# Patient Record
Sex: Male | Born: 1935
Health system: Southern US, Community
[De-identification: ages and names within clinical notes are randomized; demographics above are authoritative.]

## PROBLEM LIST (undated history)

## (undated) DIAGNOSIS — Z0389 Encounter for observation for other suspected diseases and conditions ruled out: Secondary | ICD-10-CM

## (undated) DIAGNOSIS — Z87442 Personal history of urinary calculi: Secondary | ICD-10-CM

## (undated) DIAGNOSIS — Z972 Presence of dental prosthetic device (complete) (partial): Secondary | ICD-10-CM

## (undated) DIAGNOSIS — K4090 Unilateral inguinal hernia, without obstruction or gangrene, not specified as recurrent: Secondary | ICD-10-CM

## (undated) DIAGNOSIS — IMO0001 Reserved for inherently not codable concepts without codable children: Secondary | ICD-10-CM

## (undated) DIAGNOSIS — F329 Major depressive disorder, single episode, unspecified: Secondary | ICD-10-CM

## (undated) DIAGNOSIS — K5730 Diverticulosis of large intestine without perforation or abscess without bleeding: Secondary | ICD-10-CM

## (undated) DIAGNOSIS — K648 Other hemorrhoids: Secondary | ICD-10-CM

## (undated) DIAGNOSIS — M069 Rheumatoid arthritis, unspecified: Secondary | ICD-10-CM

## (undated) DIAGNOSIS — E041 Nontoxic single thyroid nodule: Secondary | ICD-10-CM

## (undated) DIAGNOSIS — Z952 Presence of prosthetic heart valve: Secondary | ICD-10-CM

## (undated) DIAGNOSIS — I10 Essential (primary) hypertension: Secondary | ICD-10-CM

## (undated) DIAGNOSIS — I350 Nonrheumatic aortic (valve) stenosis: Secondary | ICD-10-CM

## (undated) DIAGNOSIS — N400 Enlarged prostate without lower urinary tract symptoms: Secondary | ICD-10-CM

## (undated) DIAGNOSIS — K219 Gastro-esophageal reflux disease without esophagitis: Secondary | ICD-10-CM

## (undated) DIAGNOSIS — E785 Hyperlipidemia, unspecified: Secondary | ICD-10-CM

## (undated) DIAGNOSIS — F32A Depression, unspecified: Secondary | ICD-10-CM

## (undated) DIAGNOSIS — H9190 Unspecified hearing loss, unspecified ear: Secondary | ICD-10-CM

## (undated) HISTORY — PX: COLONOSCOPY: SHX174

## (undated) HISTORY — PX: CARDIAC CATHETERIZATION: SHX172

## (undated) HISTORY — DX: Rheumatoid arthritis, unspecified: M06.9

## (undated) HISTORY — DX: Encounter for observation for other suspected diseases and conditions ruled out: Z03.89

## (undated) HISTORY — PX: CATARACT EXTRACTION, BILATERAL: SHX1313

## (undated) HISTORY — DX: Reserved for inherently not codable concepts without codable children: IMO0001

## (undated) HISTORY — DX: Nontoxic single thyroid nodule: E04.1

## (undated) HISTORY — DX: Gilbert syndrome: E80.4

## (undated) HISTORY — PX: CARDIOVASCULAR STRESS TEST: SHX262

## (undated) SURGERY — RIGHT/LEFT HEART CATH AND CORONARY ANGIOGRAPHY
Anesthesia: LOCAL

---

## 2001-01-28 ENCOUNTER — Observation Stay (HOSPITAL_COMMUNITY): Admission: EM | Admit: 2001-01-28 | Discharge: 2001-01-29 | Payer: Self-pay

## 2001-02-28 ENCOUNTER — Encounter: Payer: Self-pay | Admitting: Gastroenterology

## 2001-02-28 ENCOUNTER — Encounter: Admission: RE | Admit: 2001-02-28 | Discharge: 2001-02-28 | Payer: Self-pay | Admitting: Gastroenterology

## 2003-01-21 ENCOUNTER — Ambulatory Visit (HOSPITAL_COMMUNITY): Admission: RE | Admit: 2003-01-21 | Discharge: 2003-01-21 | Payer: Self-pay | Admitting: Gastroenterology

## 2003-07-01 ENCOUNTER — Ambulatory Visit (HOSPITAL_COMMUNITY): Admission: RE | Admit: 2003-07-01 | Discharge: 2003-07-01 | Payer: Self-pay | Admitting: Neurology

## 2005-07-27 ENCOUNTER — Encounter: Admission: RE | Admit: 2005-07-27 | Discharge: 2005-07-27 | Payer: Self-pay | Admitting: Gastroenterology

## 2009-05-11 ENCOUNTER — Encounter: Admission: RE | Admit: 2009-05-11 | Discharge: 2009-05-11 | Payer: Self-pay | Admitting: Internal Medicine

## 2009-12-04 ENCOUNTER — Encounter: Admission: RE | Admit: 2009-12-04 | Discharge: 2009-12-04 | Payer: Self-pay | Admitting: Geriatric Medicine

## 2010-07-17 ENCOUNTER — Emergency Department (HOSPITAL_COMMUNITY): Payer: Medicare Other

## 2010-07-17 ENCOUNTER — Inpatient Hospital Stay (HOSPITAL_COMMUNITY)
Admission: EM | Admit: 2010-07-17 | Discharge: 2010-07-19 | DRG: 287 | Disposition: A | Discharge status: Home / Self Care | Payer: Medicare Other | Attending: Cardiology | Admitting: Cardiology

## 2010-07-17 DIAGNOSIS — F3289 Other specified depressive episodes: Secondary | ICD-10-CM | POA: Diagnosis present

## 2010-07-17 DIAGNOSIS — F329 Major depressive disorder, single episode, unspecified: Secondary | ICD-10-CM | POA: Diagnosis present

## 2010-07-17 DIAGNOSIS — R0602 Shortness of breath: Secondary | ICD-10-CM | POA: Diagnosis present

## 2010-07-17 DIAGNOSIS — R0789 Other chest pain: Principal | ICD-10-CM | POA: Diagnosis present

## 2010-07-17 DIAGNOSIS — I10 Essential (primary) hypertension: Secondary | ICD-10-CM | POA: Diagnosis present

## 2010-07-17 DIAGNOSIS — I359 Nonrheumatic aortic valve disorder, unspecified: Secondary | ICD-10-CM | POA: Diagnosis present

## 2010-07-17 LAB — BRAIN NATRIURETIC PEPTIDE: Pro B Natriuretic peptide (BNP): 105 pg/mL — ABNORMAL HIGH (ref 0.0–100.0)

## 2010-07-17 LAB — POCT CARDIAC MARKERS: Troponin i, poc: <0.05 ng/mL (ref 0.00–0.09)

## 2010-07-17 LAB — COMPREHENSIVE METABOLIC PANEL
ALT: 38 U/L (ref 0–53)
AST: 34 U/L (ref 0–37)
Albumin: 3.9 g/dL (ref 3.5–5.2)
Alkaline Phosphatase: 57 U/L (ref 39–117)
BUN: 15 mg/dL (ref 6–23)
Chloride: 104 mEq/L (ref 96–112)
GFR calc Af Amer: >60 mL/min (ref >60)
Potassium: 3.8 mEq/L (ref 3.5–5.1)
Sodium: 138 mEq/L (ref 135–145)
Total Bilirubin: 1.7 mg/dL — ABNORMAL HIGH (ref 0.3–1.2)
Total Protein: 7.6 g/dL (ref 6.0–8.3)

## 2010-07-17 LAB — POCT I-STAT, CHEM 8
Calcium, Ion: 1.14 mmol/L (ref 1.12–1.32)
Chloride: 106 mEq/L (ref 96–112)
Creatinine, Ser: 1.2 mg/dL (ref 0.4–1.5)
Glucose, Bld: 91 mg/dL (ref 70–99)
HCT: 43 % (ref 39.0–52.0)

## 2010-07-17 LAB — DIFFERENTIAL
Eosinophils Relative: 4 % (ref 0–5)
Lymphocytes Relative: 23 % (ref 12–46)
Lymphs Abs: 1.6 10*3/uL (ref 0.7–4.0)
Monocytes Absolute: 0.6 10*3/uL (ref 0.1–1.0)

## 2010-07-17 LAB — CBC
HCT: 41.1 % (ref 39.0–52.0)
Hemoglobin: 13.5 g/dL (ref 13.0–17.0)
MCHC: 32.8 g/dL (ref 30.0–36.0)
MCV: 86.9 fL (ref 78.0–100.0)
MCV: 87.1 fL (ref 78.0–100.0)
Platelets: 172 10*3/uL (ref 150–400)
RBC: 4.9 MIL/uL (ref 4.22–5.81)
RDW: 12.4 % (ref 11.5–15.5)
RDW: 12.4 % (ref 11.5–15.5)
WBC: 7.4 10*3/uL (ref 4.0–10.5)

## 2010-07-17 LAB — PROTIME-INR: INR: 1 (ref 0.00–1.49)

## 2010-07-18 DIAGNOSIS — I359 Nonrheumatic aortic valve disorder, unspecified: Secondary | ICD-10-CM

## 2010-07-18 LAB — CARDIAC PANEL(CRET KIN+CKTOT+MB+TROPI)
CK, MB: 2.7 ng/mL (ref 0.3–4.0)
Relative Index: INVALID (ref 0.0–2.5)
Total CK: 87 U/L (ref 7–232)
Troponin I: 0.01 ng/mL (ref 0.00–0.06)

## 2010-07-18 LAB — TSH: TSH: 2.04 u[IU]/mL (ref 0.350–4.500)

## 2010-07-19 ENCOUNTER — Ambulatory Visit (HOSPITAL_COMMUNITY)
Admission: RE | Admit: 2010-07-19 | Discharge: 2010-07-19 | Disposition: A | Discharge status: Home / Self Care | Payer: Medicare Other | Source: Ambulatory Visit | Attending: Cardiology | Admitting: Cardiology

## 2010-07-19 DIAGNOSIS — I10 Essential (primary) hypertension: Secondary | ICD-10-CM | POA: Insufficient documentation

## 2010-07-19 DIAGNOSIS — R0602 Shortness of breath: Secondary | ICD-10-CM | POA: Insufficient documentation

## 2010-07-19 DIAGNOSIS — I359 Nonrheumatic aortic valve disorder, unspecified: Secondary | ICD-10-CM | POA: Insufficient documentation

## 2010-07-19 DIAGNOSIS — E041 Nontoxic single thyroid nodule: Secondary | ICD-10-CM | POA: Insufficient documentation

## 2010-07-19 DIAGNOSIS — F3289 Other specified depressive episodes: Secondary | ICD-10-CM | POA: Insufficient documentation

## 2010-07-19 DIAGNOSIS — R0789 Other chest pain: Secondary | ICD-10-CM | POA: Insufficient documentation

## 2010-07-19 DIAGNOSIS — F329 Major depressive disorder, single episode, unspecified: Secondary | ICD-10-CM | POA: Insufficient documentation

## 2010-07-19 LAB — BASIC METABOLIC PANEL
CO2: 26 mEq/L (ref 19–32)
Calcium: 9.1 mg/dL (ref 8.4–10.5)
Chloride: 103 mEq/L (ref 96–112)
Creatinine, Ser: 0.92 mg/dL (ref 0.4–1.5)
GFR calc Af Amer: >60 mL/min (ref >60)
Glucose, Bld: 106 mg/dL — ABNORMAL HIGH (ref 70–99)

## 2010-07-19 LAB — CBC
HCT: 43.3 % (ref 39.0–52.0)
Hemoglobin: 14.4 g/dL (ref 13.0–17.0)
MCH: 28.8 pg (ref 26.0–34.0)
MCHC: 33.3 g/dL (ref 30.0–36.0)
RBC: 5 MIL/uL (ref 4.22–5.81)

## 2010-07-19 LAB — POCT I-STAT 3, ART BLOOD GAS (G3+)
Bicarbonate: 26.1 mEq/L — ABNORMAL HIGH (ref 20.0–24.0)
O2 Saturation: 92 %
TCO2: 28 mmol/L (ref 0–100)
pO2, Arterial: 67 mmHg — ABNORMAL LOW (ref 80.0–100.0)

## 2010-07-19 LAB — POCT I-STAT 3, VENOUS BLOOD GAS (G3P V)
Bicarbonate: 26.8 mEq/L — ABNORMAL HIGH (ref 20.0–24.0)
pCO2, Ven: 49.5 mmHg (ref 45.0–50.0)
pH, Ven: 7.328 — ABNORMAL HIGH (ref 7.250–7.300)
pH, Ven: 7.341 — ABNORMAL HIGH (ref 7.250–7.300)
pO2, Ven: 33 mmHg (ref 30.0–45.0)

## 2010-07-27 NOTE — Discharge Summary (Addendum)
NAMEBRENDAN, [name]               ACCOUNT NO.:  [id number]  MEDICAL RECORD NO.:  [id number]           PATIENT TYPE:  O  LOCATION:  [facility]                         FACILITY:  [facility]  PHYSICIAN:  Armanda Magic, M.D.     DATE OF BIRTH:  December 10, 1927  DATE OF ADMISSION:  07/19/2010 DATE OF DISCHARGE:                              DISCHARGE SUMMARY   [name] medical record number is [id number].  PRIMARY CARDIOLOGIST:  Jake Bathe, MD  ADMISSION DIAGNOSES: 1. Chest pain. 2. Shortness of breath. 3. Moderate aortic stenosis by echocardiogram in May 2011. 4. Hypertension. 5. Depression. 6. Thyroid nodules.  DISCHARGE DIAGNOSES: 1. Moderate aortic stenosis with mean aortic valve gradient 18 mmHg,     aortic valve area 1.1 sq cm. 2. Normal coronary arteries by cath. 3. Depression. 4. Hypertension. 5. Thyroid nodules. 6. Noncardiac chest pain and shortness of breath, possibly due to     anxiety.  PROCEDURES: 1. Right and left heart catheterization. 2. Coronary angiography. 3. Left ventriculography.  HISTORY OF PRESENT ILLNESS:  This is an 77 year old male who has a history of moderate aortic stenosis, diagnosed in May 2011 by echocardiogram, also normal nuclear stress test at that time who presented to [facility] Emergency Room with complaints of chest pain and shortness of breath.  This has in going on for 2 days, mainly at night, and he would have to wake up and move around to get comfortably again.  Apparently, he had not been exercising due to his illness of his wife for 4 months and started exercising again.  He did not have any exertional chest pain or shortness of breath.  He was admitted to the hospital and ruled out for myocardial infarction by serial cardiac enzymes.  He was started on subcu Lovenox.  A 2-D echocardiogram was obtained which really showed no significant change from his echocardiogram in May 2011.  Because of the patient's symptoms, it was felt that we  should proceed with cardiac catheterization.  On July 19, 2009, he underwent cardiac catheterization revealing normal coronary arteries and normal LV function.  Moderate aortic stenosis was noted with a mean aortic valve gradient of 18 mmHg with an aortic valve area calculated at 1.1 sq cm.  There was no evidence of pulmonary hypertension.  The patient did well post cath without any complications. He subsequently was discharged to home after his IV fluids and bedrest were completed.  LABORATORY DATA DURING HOSPITAL STAY:  Sodium 139, potassium 4.8, chloride 106, bicarb 29, BUN 23, creatinine 1.2, glucose 91.  INR 1. Point-of-care markers negative x2.  White cell count 6.7, hemoglobin 13.5, hematocrit 41.1, platelet count 164.  Chest x-ray shows no active disease.  DISCHARGE MEDICATIONS: 1. Avodart 0.5 mg daily. 2. Diovan 80 mg daily. 3. Fish oil over the counter one tablet daily. 4. Lexapro 10 mg daily. 5. Simvastatin 20 mg daily. 6. VESIcare 5 mg daily.  FOLLOWUP:  The patient will follow up with Dr. Donato Schultz on August 03, 2010, at 10:00 a.m.  A total of 45 minutes was used to prepare the patient's discharge including preparing discharge summary,  discharge medication sheet, followup appointments, and discharge dictation.     Armanda Magic, M.D.     TT/MEDQ  D:  07/19/2010  T:  07/20/2010  Job:  [id number]  cc:   Jake Bathe, MD  Electronically Signed by Armanda Magic M.D. on 08/21/2010 02:07:11 PM

## 2010-07-27 NOTE — H&P (Signed)
[name], [name]               ACCOUNT NO.:  [id number]  MEDICAL RECORD NO.:  [id number]           PATIENT TYPE:  I  LOCATION:  1427                         FACILITY:  [facility]  PHYSICIAN:  Armanda Magic, M.D.     DATE OF BIRTH:  08-Jan-1928  DATE OF ADMISSION:  07/17/2010 DATE OF DISCHARGE:  07/19/2010                             HISTORY & PHYSICAL   REFERRING PHYSICIAN:  [facility].  CHIEF COMPLAINT:  Chest pain and shortness of breath.  HISTORY OF PRESENT ILLNESS:  This is an 77 year old male with a history of moderate aortic stenosis by echo in May 2011, with normal nuclear stress test in May 2011, who presented to [facility] Emergency Room with complaints of chest pain and shortness of breath.  The patient states he had had chest pain, described as a heaviness for 2 days, associated with shortness of breath.  He denies any true chest pain, but mainly heaviness.  Apparently, he normally exercises but has not been doing this due to caring for his sick wife.  He states he started exercising over the past 2 days.  Currently, he is asymptomatic with no PND, but does describe symptoms of orthopnea.  There is a significant language barrier and so it is difficult to get an accurate history but it does sound like he was getting short of breath lying in bed and would have to move around to be comfortable again.  PAST MEDICAL HISTORY:  Includes depression, moderate aortic stenosis by echo in May 2011, his mean gradient at that time was 22 mmHg with an aortic valve area of 1.04 cm2.  Hypertension, thyroid nodules.  Nuclear stress test in May 2011, showing no ischemia, normal LV function.  FAMILY HISTORY:  His father died at 87 secondary to CVA, his mother died at 25 secondary to pulmonary embolus after surgery.  SOCIAL HISTORY:  There is a history of tobacco use, but he quit in 1982. He occasionally drinks alcohol.  He denies any IV drug use.  MEDICATIONS: 1.  Lexapro 10 mg daily. 2. Diovan 80 mg daily. 3. Simvastatin 20 mg daily. 4. Avodart 0.5 mg daily. 5. Zofran orally disintegrating tablet 4 mg p.r.n. 6. VESIcare 5 mg daily.  ALLERGIES:  PENICILLIN.  REVIEW OF SYSTEMS:  Otherwise as stated in HPI is negative.  PHYSICAL EXAMINATION:  VITAL SIGNS:  Blood pressure is 117/59, heart rate is 49, O2 saturation is 99% on 2 liters. GENERAL:  Well-developed, well-nourished white male, in no acute distress. HEENT:  Benign. NECK:  Supple without lymphadenopathy.  Carotid upstrokes +2 bilateral. No bruits. LUNGS:  Clear to auscultation throughout. HEART:  Regular rate and rhythm with a 2/6 systolic murmur at the right upper sternal border, late peaking. ABDOMEN:  Soft, nontender, nondistended. EXTREMITIES:  No edema.  LABS:  Sodium 139, potassium 4.8, chloride 106, bicarb 29, BUN 23, creatinine 1.2, glucose 91.  INR 1.  MBs are 1.9 and 1.9, troponin less than 0.05 x2.  EKG shows sinus bradycardia at 51 beats per minute with left atrial enlargement.  Chest x-ray shows no active disease.  ASSESSMENT: 1. Shortness of  breath and chest heaviness, worse at night.  His     daughter states he wakes up at night short of breath and moves     around and gets comfortable again before falling asleep.  There is     no obvious paroxysmal nocturnal dyspnea.  Symptoms only occur at     night but not with exercise. 2. Moderate aortic stenosis. 3. Depression. 4. Hypertension.  PLAN:  Admit to Telemetry Unit and cycle cardiac enzymes.  We will check a BNP, check 2D echocardiogram to reassess heart valve, may need right and left heart cath on Monday pending results of echo.  He has no symptoms with exercise but does seem to get uncomfortable at night with shortness of breath and chest pain when lying flat.  We will continue Lovenox per pharmacy.  No nitroglycerin at this time given his AF and he is currently pain free.     Armanda Magic,  M.D.     TT/MEDQ  D:  07/19/2010  T:  07/20/2010  Job:  [id number]  cc:   Jake Bathe, MD  Electronically Signed by Armanda Magic M.D. on 07/20/2010 09:36:48 AM

## 2010-07-27 NOTE — Procedures (Signed)
NAMEMARCIO, HOQUE               ACCOUNT NO.:  [id number]  MEDICAL RECORD NO.:  [id number]           PATIENT TYPE:  I  LOCATION:  1427                         FACILITY:  [facility]  PHYSICIAN:  Armanda Magic, M.D.     DATE OF BIRTH:  1928-03-13  DATE OF PROCEDURE:  07/19/2010 DATE OF DISCHARGE:  07/19/2010                           CARDIAC CATHETERIZATION   PROCEDURE:  Right and left heart catheterization, coronary angiography, and left ventriculography.  OPERATIVE SURGEON:  Armanda Magic, M.D.  INDICATIONS:  Chest pain, shortness of breath, and aortic stenosis.  COMPLICATIONS:  None.  IV access via right femoral artery 6-French sheath and right femoral vein 7-French sheath.  MEDICATIONS:  Versed 1 mg and fentanyl 25 mcg.  HISTORY:  This is a very pleasant 77 year old [place] male who was admitted with complaints of chest pain and shortness of breath.  He has a known history of moderate aortic stenosis by 2-D echocardiogram done in May 2011 and now presents for cardiac catheterization to evaluate possible further progression of aortic stenosis.  PROCEDURE IN DETAIL:  The patient is brought to the cardiac catheterization laboratory in the fasting nonsedated state.  Informed consent was obtained.  The patient was connected to continuous heart rate with pulse oximetry monitoring and intermittent blood pressure monitoring.  The right groin was prepped and draped in a sterile fashion.  Xylocaine 1% was used for local anesthesia.  Using the modified Seldinger technique, a 6-French sheath was placed in the right femoral artery.  Using modified Seldinger technique, a 7-French sheath was placed in the right femoral vein.  Under fluoroscopic guidance, a Swan-Ganz catheter was placed under balloon flotation in the right atrium.  Right atrial pressure was measured and right atrial O2 saturations were obtained.  The catheter was advanced into the right ventricle and right ventricular  pressure was measured.  The catheter was then advanced into the pulmonary artery, and pulmonary artery pressure and O2 saturations were obtained.  The catheter was then allowed to float into the pulmonary capillary wedge position.  Pulmonary capillary wedge pressure was measured.  The balloon was then deflated, and the catheter was pulled back into the main pulmonary artery.  Cardiac outputs were obtained on three separate injections, each using a total of 10 mL of saline.  At the completion of the cardiac output, the catheter was removed under fluoroscopy.  A 5-French JL-4 catheter was then placed into the left coronary artery. Multiple cine films were taken at 30-degree RAO and 40-degree LAO views. This catheter was exchanged over a guidewire for a 4-French JR-4 catheter which could not successfully engage the coronary ostium.  This catheter was exchanged out over a guidewire for a 5-French __________ right coronary artery catheter which successfully engaged the coronary ostium.  Multiple cine films were taken at 30-degree RAO and 40-degree LAO views.  This catheter was then exchanged over a guidewire for a 5- French angled pigtail catheter which was placed under fluoroscopic guidance in the area of the aortic valve.  It was able to cross the aortic valve into the left ventricular cavity.  Left ventriculography was performed in the  30-degree RAO view and a total of 25 mL of contrast at 12 mL/sec.  The catheter was then pulled back across the aortic valve, with only a mild gradient noted.  At the end of procedure, all catheters and sheaths were removed.  Manual compression was performed until adequate hemostasis was obtained.  The patient was transferred back to room in stable condition.  RESULTS:  Right heart cath data.  Right atrial pressure 10/8 with a mean of 6 mmHg.  RV pressure 27/3 with a mean of 7 mmHg.  PA pressure 26/13 mmHg.  Pulmonary capillary wedge 12/8 with a mean of 9  mmHg, LV pressure 118/6 with a mean of 9 mm of pressure, aortic 108/58 mmHg.  O2 saturations, RA 58%,  PA 60%, AO 92%.  Cardiac output by Fick 3.74. Cardiac index by Fick 2.  Cardiac output by thermodilution 2.78. Cardiac index by thermodilution 1.49.  Aortic pullback 112/58 mmHg, LV pullback 133/7 mmHg, mean aortic valve gradient 18 mmHg, and aortic valve area 1.12 sq cm.  The LAD is widely patent and bifurcates to left anterior descending artery and left circumflex artery.  The LAD traverses to the apex and is widely patent throughout its course.  It gives rise to two moderate sized diagonal branches, both of which are widely patent.  The left circumflex is widely patent throughout its course in the AV groove.  It gives rise to three obtuse marginal branches.  The first marginal branch is very large and widely patent.  The second obtuse marginal is moderate in size and widely patent, and the third obtuse marginal branch is small but widely patent.  The right coronary artery is nondominant but is widely patent.  The posterior descending artery does come off the left circumflex distally and is widely patent.  Left ventriculography shows normal LV function, EF 60%.  ASSESSMENT: 1. Moderate aortic stenosis. 2. Normal coronary arteries. 3. Normal left ventricular function. 4. Normal right heart pressures.  PLAN:  Discharge home after IV fluid and bedrest complete.  Will follow up with Dr. Anne Fu in 2 weeks for further evaluation.     Armanda Magic, M.D.     TT/MEDQ  D:  07/19/2010  T:  07/20/2010  Job:  [id number]  Electronically Signed by Armanda Magic M.D. on 07/20/2010 09:36:50 AM

## 2010-10-21 ENCOUNTER — Ambulatory Visit: Payer: Medicare Other | Attending: Geriatric Medicine | Admitting: Physical Therapy

## 2010-10-21 DIAGNOSIS — IMO0001 Reserved for inherently not codable concepts without codable children: Secondary | ICD-10-CM | POA: Insufficient documentation

## 2010-10-21 DIAGNOSIS — M545 Low back pain, unspecified: Secondary | ICD-10-CM | POA: Insufficient documentation

## 2010-10-21 DIAGNOSIS — M2560 Stiffness of unspecified joint, not elsewhere classified: Secondary | ICD-10-CM | POA: Insufficient documentation

## 2010-10-21 DIAGNOSIS — M25559 Pain in unspecified hip: Secondary | ICD-10-CM | POA: Insufficient documentation

## 2010-10-29 NOTE — H&P (Signed)
. [facility]  Patient:    [name], [name]                      MRN: [id number] Adm. Date:  [id number] Attending:  Daine Gip                         History and Physical  IDENTIFYING DATA:  [name] is a very nice 77 year old [place] immigrant from [place].  He has lived in the [place] for 11 years.  He has enjoyed excellent health.  HISTORY OF PRESENT ILLNESS:  At 11 p.m. last night he developed shaking chills.  Temperature rapidly went up to 101 degrees.  He then went into the rest room, apparently urinated, and granddaughter heard him wretching.  She went in and he had fainted.  He did complain of some chest tightness through this, but it appeared to be more from the wretching and was more in the upper throat area.  He had eaten some baked fish earlier in the afternoon.  His wife apparently also developed some diarrhea.  He denies any dysuria or cough, no abdominal pain.  He states he has had no pain anywhere.  He does have a prior history of nephrolithiasis.  Also of note, he has occasionally noted a white coating on his tongue, but that has been going on for some months.  He has been somewhat worried about it.  He denies any headache.  ALLERGIES:  No known drug allergies.  CURRENT MEDICATION:  An aspirin a day.  PAST MEDICAL HISTORY:  Negative for diabetes, coronary artery disease, stroke, cancer, or tuberculosis.  It is remarkable for nephrolithiasis.  PREVIOUS SURGERIES:  None.  FAMILY HISTORY:  Brother died of complications of diabetes.  One brother still has diabetes.  Sister has had breast cancer.  SOCIAL HISTORY:  He is a retired Art gallery manager.  He is married, does not smoke, no alcohol.  He has two daughters.  REVIEW OF SYSTEMS:  Currently he denies any headache, no change in vision or hearing, no shortness of breath, no chest pain, no abdominal pain.  PHYSICAL EXAMINATION:  VITAL SIGNS:  Temperature 101.6,  blood pressure 120/68, pulse 90, respiratory rate 24, O2 saturation 91%.  SKIN:  Warm and dry.  HEENT:  Pupils equal, round, and reactive to light.  Fundi on the left poorly visualized.  The right was normal.  TM normal.  Oropharynx moist.  He did have a slight white coating with a geographic appearance on his tongue.  LUNGS:  Clear.  HEART:  Regular rate and rhythm without murmurs, rubs, or gallops.  ABDOMEN:  Soft.  No hepatosplenomegaly or masses palpated.  RECTAL:  Normal tone.  Prostate 2+, nontender.  NEUROLOGIC:  Somewhat limited because of his language barrier, but daughters present demonstrated that he was oriented and appropriate.  He had a nonfocal exam.  LABORATORY DATA ON ADMISSION:  Chest x-ray, no active disease.  White count 10,800, hemoglobin 14.6, platelet count 164,000, 90% neutrophils, 5% lymphs, 5% monos.  Sodium 139, potassium 4.5, chloride 101, bicarb 30, glucose 148, BUN 12, creatinine 1.2, calcium 9.0, total protein 6.7, albumin 3.6, SGOT 30, SGPT 35, alkaline phosphatase 64, total bilirubin elevated at 1.7.  Urinalysis is normal.  CK 86 with an MB of 1.2.  Troponin 0.01.  EKG was normal.  ASSESSMENT: 1. Sudden onset of chills, high fever, question bacteremia, most logical    cause bacterial  food poisoning.  Also question cholecystitis with elevated    bilirubin, but his alkaline phosphatase and transaminase were normal, and    his exam also does not support this problem. 2. Vasovagal episode. 3. Geographic tongue.  Will get scraping for fungal elements.  PLAN:  Admit for observation.  IV fluids.  Liquid lactose free diet.  Advance as tolerated.  Culture blood and urine.  Repeat his liver panel later in the day. DD:  01/28/01 TD:  01/28/01 Job: [id number]

## 2010-10-29 NOTE — Discharge Summary (Signed)
[facility]  Patient:    DORA, CLAUSS Visit Number: [id number] MRN: [id number]          Service Type: MED Location: [phone] Attending Physician:  Daine Gip Adm. Date:  [id number] Disc. Date: [id number]                             Discharge Summary  DISCHARGE DIAGNOSES: 1. Acute gastroenteritis. 2. Vasovagal syncope secondary to #1. 3. History of kidney stones. 4. Gilberts syndrome. 5. Remote history of depression.  DISCHARGE MEDICATIONS:  Protonix 40 mg q.a.m.  ACTIVITY:  Without restriction.  DIET:  Without restriction.  CONDITION:  Improved.  FOLLOWUP:  Follow up in my office in one week where we may consider an upper GI and ultrasound of the gallbladder.  BRIEF HISTORY:  The patient is a 77 year old white male who has not been seen in my office for four years.  He presented to the emergency room after having syncope.  At 11 p.m. the night prior to admission, he developed shaking chills and a fever to 101.  He also complained of nausea and frequent vomiting with mild epigastric and chest tightness.  He and his wife had both had mild diarrhea.  Following a prolonged episode of vomiting, he apparently passed out and was brought to the emergency room by his family.  He had no previous history of gastroenteritis and his only other GI diagnosis is Gilberts syndrome, which produces high indirect bilirubin in his blood.  PHYSICAL EXAMINATION:  VITAL SIGNS:  Temperature 101, blood pressure 120/68, pulse 90.  ABDOMINAL EXAM:  Revealed a soft abdomen without hepatosplenomegaly or masses. Rectal exam showed a 2+ prostate and stool was guaiac negative.  LABORATORY DATA:  White count was 10,800.  The remainder of his CBC was normal.  His CMET was normal except for a bilirubin of 1.7, repeated at 2.2 and found to be totally indirect.  Chest x-ray was no active disease, amylase and lipase were normal.  HOSPITAL COURSE:   ACUTE GASTROENTERITIS:  Patient was placed at bedrest and on IV fluids and within 24 hours, his temperature had returned to normal and he was feeling much better.  Since he was admitted as a 24-hour observation, it was felt that it was safe for him to go home, although I am starting him on a PPI and plan a follow-up visit in one week to determine whether he needs further GI workup.  The patient was monitored throughout his hospitalization and never developed any arrhythmia.  FINAL DIAGNOSES:  As above. DD:  01/29/01 TD:  01/29/01 Job: [id number]

## 2010-11-05 ENCOUNTER — Encounter: Payer: Medicare Other | Admitting: Physical Therapy

## 2010-11-12 ENCOUNTER — Ambulatory Visit: Payer: Medicare Other | Attending: Geriatric Medicine | Admitting: Physical Therapy

## 2010-11-12 DIAGNOSIS — M545 Low back pain, unspecified: Secondary | ICD-10-CM | POA: Insufficient documentation

## 2010-11-12 DIAGNOSIS — M25559 Pain in unspecified hip: Secondary | ICD-10-CM | POA: Insufficient documentation

## 2010-11-12 DIAGNOSIS — M2560 Stiffness of unspecified joint, not elsewhere classified: Secondary | ICD-10-CM | POA: Insufficient documentation

## 2010-11-12 DIAGNOSIS — IMO0001 Reserved for inherently not codable concepts without codable children: Secondary | ICD-10-CM | POA: Insufficient documentation

## 2010-11-19 ENCOUNTER — Encounter: Payer: Medicare Other | Admitting: Physical Therapy

## 2010-11-26 ENCOUNTER — Encounter: Payer: Medicare Other | Admitting: Physical Therapy

## 2010-12-03 ENCOUNTER — Encounter: Payer: Medicare Other | Admitting: Physical Therapy

## 2011-06-14 HISTORY — PX: CATARACT EXTRACTION W/ INTRAOCULAR LENS  IMPLANT, BILATERAL: SHX1307

## 2011-06-23 DIAGNOSIS — Z23 Encounter for immunization: Secondary | ICD-10-CM | POA: Diagnosis not present

## 2011-07-22 DIAGNOSIS — R350 Frequency of micturition: Secondary | ICD-10-CM | POA: Diagnosis not present

## 2011-07-22 DIAGNOSIS — N400 Enlarged prostate without lower urinary tract symptoms: Secondary | ICD-10-CM | POA: Diagnosis not present

## 2011-07-22 DIAGNOSIS — R351 Nocturia: Secondary | ICD-10-CM | POA: Diagnosis not present

## 2011-07-29 DIAGNOSIS — I359 Nonrheumatic aortic valve disorder, unspecified: Secondary | ICD-10-CM | POA: Diagnosis not present

## 2011-07-29 DIAGNOSIS — K469 Unspecified abdominal hernia without obstruction or gangrene: Secondary | ICD-10-CM | POA: Diagnosis not present

## 2011-07-29 DIAGNOSIS — E7800 Pure hypercholesterolemia, unspecified: Secondary | ICD-10-CM | POA: Diagnosis not present

## 2011-07-29 DIAGNOSIS — M2550 Pain in unspecified joint: Secondary | ICD-10-CM | POA: Diagnosis not present

## 2011-07-29 DIAGNOSIS — I10 Essential (primary) hypertension: Secondary | ICD-10-CM | POA: Diagnosis not present

## 2011-07-29 DIAGNOSIS — Z0000 Encounter for general adult medical examination without abnormal findings: Secondary | ICD-10-CM | POA: Diagnosis not present

## 2011-07-29 DIAGNOSIS — Z79899 Other long term (current) drug therapy: Secondary | ICD-10-CM | POA: Diagnosis not present

## 2011-07-29 DIAGNOSIS — Z23 Encounter for immunization: Secondary | ICD-10-CM | POA: Diagnosis not present

## 2011-08-19 ENCOUNTER — Other Ambulatory Visit: Payer: Self-pay | Admitting: Geriatric Medicine

## 2011-08-19 ENCOUNTER — Ambulatory Visit
Admission: RE | Admit: 2011-08-19 | Discharge: 2011-08-19 | Disposition: A | Discharge status: Home / Self Care | Payer: Medicare Other | Source: Ambulatory Visit | Attending: Geriatric Medicine | Admitting: Geriatric Medicine

## 2011-08-19 DIAGNOSIS — R109 Unspecified abdominal pain: Secondary | ICD-10-CM | POA: Diagnosis not present

## 2011-08-19 DIAGNOSIS — K5730 Diverticulosis of large intestine without perforation or abscess without bleeding: Secondary | ICD-10-CM | POA: Diagnosis not present

## 2011-08-19 DIAGNOSIS — N200 Calculus of kidney: Secondary | ICD-10-CM | POA: Diagnosis not present

## 2011-08-19 DIAGNOSIS — R52 Pain, unspecified: Secondary | ICD-10-CM

## 2011-08-19 DIAGNOSIS — K4090 Unilateral inguinal hernia, without obstruction or gangrene, not specified as recurrent: Secondary | ICD-10-CM | POA: Diagnosis not present

## 2011-12-28 DIAGNOSIS — M2550 Pain in unspecified joint: Secondary | ICD-10-CM | POA: Diagnosis not present

## 2012-01-02 DIAGNOSIS — Z79899 Other long term (current) drug therapy: Secondary | ICD-10-CM | POA: Diagnosis not present

## 2012-01-02 DIAGNOSIS — E7800 Pure hypercholesterolemia, unspecified: Secondary | ICD-10-CM | POA: Diagnosis not present

## 2012-01-06 DIAGNOSIS — I10 Essential (primary) hypertension: Secondary | ICD-10-CM | POA: Diagnosis not present

## 2012-01-06 DIAGNOSIS — I359 Nonrheumatic aortic valve disorder, unspecified: Secondary | ICD-10-CM | POA: Diagnosis not present

## 2012-01-06 DIAGNOSIS — E7800 Pure hypercholesterolemia, unspecified: Secondary | ICD-10-CM | POA: Diagnosis not present

## 2012-01-17 DIAGNOSIS — H04229 Epiphora due to insufficient drainage, unspecified lacrimal gland: Secondary | ICD-10-CM | POA: Diagnosis not present

## 2012-01-17 DIAGNOSIS — Z961 Presence of intraocular lens: Secondary | ICD-10-CM | POA: Diagnosis not present

## 2012-01-17 DIAGNOSIS — H5210 Myopia, unspecified eye: Secondary | ICD-10-CM | POA: Diagnosis not present

## 2012-01-17 DIAGNOSIS — H52209 Unspecified astigmatism, unspecified eye: Secondary | ICD-10-CM | POA: Diagnosis not present

## 2012-01-27 DIAGNOSIS — M129 Arthropathy, unspecified: Secondary | ICD-10-CM | POA: Diagnosis not present

## 2012-01-27 DIAGNOSIS — Z79899 Other long term (current) drug therapy: Secondary | ICD-10-CM | POA: Diagnosis not present

## 2012-01-27 DIAGNOSIS — I10 Essential (primary) hypertension: Secondary | ICD-10-CM | POA: Diagnosis not present

## 2012-01-27 DIAGNOSIS — K219 Gastro-esophageal reflux disease without esophagitis: Secondary | ICD-10-CM | POA: Diagnosis not present

## 2012-02-16 DIAGNOSIS — M2550 Pain in unspecified joint: Secondary | ICD-10-CM | POA: Diagnosis not present

## 2012-03-09 DIAGNOSIS — M064 Inflammatory polyarthropathy: Secondary | ICD-10-CM | POA: Diagnosis not present

## 2012-03-09 DIAGNOSIS — M2550 Pain in unspecified joint: Secondary | ICD-10-CM | POA: Diagnosis not present

## 2012-03-09 DIAGNOSIS — R5381 Other malaise: Secondary | ICD-10-CM | POA: Diagnosis not present

## 2012-03-09 DIAGNOSIS — R5383 Other fatigue: Secondary | ICD-10-CM | POA: Diagnosis not present

## 2012-04-05 DIAGNOSIS — M2550 Pain in unspecified joint: Secondary | ICD-10-CM | POA: Diagnosis not present

## 2012-04-05 DIAGNOSIS — M069 Rheumatoid arthritis, unspecified: Secondary | ICD-10-CM | POA: Diagnosis not present

## 2012-04-05 DIAGNOSIS — Z23 Encounter for immunization: Secondary | ICD-10-CM | POA: Diagnosis not present

## 2012-05-17 DIAGNOSIS — Z79899 Other long term (current) drug therapy: Secondary | ICD-10-CM | POA: Diagnosis not present

## 2012-05-17 DIAGNOSIS — M069 Rheumatoid arthritis, unspecified: Secondary | ICD-10-CM | POA: Diagnosis not present

## 2012-05-17 DIAGNOSIS — M2550 Pain in unspecified joint: Secondary | ICD-10-CM | POA: Diagnosis not present

## 2012-06-04 DIAGNOSIS — K219 Gastro-esophageal reflux disease without esophagitis: Secondary | ICD-10-CM | POA: Diagnosis not present

## 2012-06-04 DIAGNOSIS — I10 Essential (primary) hypertension: Secondary | ICD-10-CM | POA: Diagnosis not present

## 2012-06-14 DIAGNOSIS — M2550 Pain in unspecified joint: Secondary | ICD-10-CM | POA: Diagnosis not present

## 2012-06-14 DIAGNOSIS — Z79899 Other long term (current) drug therapy: Secondary | ICD-10-CM | POA: Diagnosis not present

## 2012-06-14 DIAGNOSIS — M069 Rheumatoid arthritis, unspecified: Secondary | ICD-10-CM | POA: Diagnosis not present

## 2012-06-20 ENCOUNTER — Other Ambulatory Visit (HOSPITAL_COMMUNITY): Payer: Self-pay | Admitting: Urology

## 2012-06-20 DIAGNOSIS — Q619 Cystic kidney disease, unspecified: Secondary | ICD-10-CM

## 2012-06-27 DIAGNOSIS — Z79899 Other long term (current) drug therapy: Secondary | ICD-10-CM | POA: Diagnosis not present

## 2012-06-27 DIAGNOSIS — E7800 Pure hypercholesterolemia, unspecified: Secondary | ICD-10-CM | POA: Diagnosis not present

## 2012-07-02 DIAGNOSIS — I10 Essential (primary) hypertension: Secondary | ICD-10-CM | POA: Diagnosis not present

## 2012-07-02 DIAGNOSIS — E7800 Pure hypercholesterolemia, unspecified: Secondary | ICD-10-CM | POA: Diagnosis not present

## 2012-07-02 DIAGNOSIS — I359 Nonrheumatic aortic valve disorder, unspecified: Secondary | ICD-10-CM | POA: Diagnosis not present

## 2012-07-09 ENCOUNTER — Ambulatory Visit (HOSPITAL_COMMUNITY)
Admission: RE | Admit: 2012-07-09 | Discharge: 2012-07-09 | Disposition: A | Discharge status: Home / Self Care | Payer: Medicare Other | Source: Ambulatory Visit | Attending: Urology | Admitting: Urology

## 2012-07-09 DIAGNOSIS — N281 Cyst of kidney, acquired: Secondary | ICD-10-CM | POA: Diagnosis not present

## 2012-07-09 DIAGNOSIS — Q619 Cystic kidney disease, unspecified: Secondary | ICD-10-CM | POA: Diagnosis not present

## 2012-07-20 DIAGNOSIS — M069 Rheumatoid arthritis, unspecified: Secondary | ICD-10-CM | POA: Diagnosis not present

## 2012-07-20 DIAGNOSIS — M2550 Pain in unspecified joint: Secondary | ICD-10-CM | POA: Diagnosis not present

## 2012-07-20 DIAGNOSIS — Z79899 Other long term (current) drug therapy: Secondary | ICD-10-CM | POA: Diagnosis not present

## 2012-08-30 DIAGNOSIS — I359 Nonrheumatic aortic valve disorder, unspecified: Secondary | ICD-10-CM | POA: Diagnosis not present

## 2012-09-14 DIAGNOSIS — Z79899 Other long term (current) drug therapy: Secondary | ICD-10-CM | POA: Diagnosis not present

## 2012-09-14 DIAGNOSIS — M2550 Pain in unspecified joint: Secondary | ICD-10-CM | POA: Diagnosis not present

## 2012-09-14 DIAGNOSIS — M069 Rheumatoid arthritis, unspecified: Secondary | ICD-10-CM | POA: Diagnosis not present

## 2012-11-27 DIAGNOSIS — Q619 Cystic kidney disease, unspecified: Secondary | ICD-10-CM | POA: Diagnosis not present

## 2012-11-27 DIAGNOSIS — R350 Frequency of micturition: Secondary | ICD-10-CM | POA: Diagnosis not present

## 2012-11-27 DIAGNOSIS — R351 Nocturia: Secondary | ICD-10-CM | POA: Diagnosis not present

## 2012-11-27 DIAGNOSIS — N400 Enlarged prostate without lower urinary tract symptoms: Secondary | ICD-10-CM | POA: Diagnosis not present

## 2012-12-03 DIAGNOSIS — Z0000 Encounter for general adult medical examination without abnormal findings: Secondary | ICD-10-CM | POA: Diagnosis not present

## 2012-12-03 DIAGNOSIS — Z1331 Encounter for screening for depression: Secondary | ICD-10-CM | POA: Diagnosis not present

## 2012-12-03 DIAGNOSIS — H6990 Unspecified Eustachian tube disorder, unspecified ear: Secondary | ICD-10-CM | POA: Diagnosis not present

## 2012-12-03 DIAGNOSIS — R350 Frequency of micturition: Secondary | ICD-10-CM | POA: Diagnosis not present

## 2013-01-02 DIAGNOSIS — E7800 Pure hypercholesterolemia, unspecified: Secondary | ICD-10-CM | POA: Diagnosis not present

## 2013-01-02 DIAGNOSIS — Z79899 Other long term (current) drug therapy: Secondary | ICD-10-CM | POA: Diagnosis not present

## 2013-01-08 DIAGNOSIS — G4730 Sleep apnea, unspecified: Secondary | ICD-10-CM | POA: Diagnosis not present

## 2013-01-08 DIAGNOSIS — K469 Unspecified abdominal hernia without obstruction or gangrene: Secondary | ICD-10-CM | POA: Diagnosis not present

## 2013-01-08 DIAGNOSIS — R109 Unspecified abdominal pain: Secondary | ICD-10-CM | POA: Diagnosis not present

## 2013-01-08 DIAGNOSIS — Z79899 Other long term (current) drug therapy: Secondary | ICD-10-CM | POA: Diagnosis not present

## 2013-01-08 DIAGNOSIS — R509 Fever, unspecified: Secondary | ICD-10-CM | POA: Diagnosis not present

## 2013-01-08 DIAGNOSIS — I359 Nonrheumatic aortic valve disorder, unspecified: Secondary | ICD-10-CM | POA: Diagnosis not present

## 2013-01-15 DIAGNOSIS — R0609 Other forms of dyspnea: Secondary | ICD-10-CM | POA: Diagnosis not present

## 2013-01-15 DIAGNOSIS — R5383 Other fatigue: Secondary | ICD-10-CM | POA: Diagnosis not present

## 2013-01-15 DIAGNOSIS — R1310 Dysphagia, unspecified: Secondary | ICD-10-CM | POA: Diagnosis not present

## 2013-01-15 DIAGNOSIS — I359 Nonrheumatic aortic valve disorder, unspecified: Secondary | ICD-10-CM | POA: Diagnosis not present

## 2013-01-15 DIAGNOSIS — R5381 Other malaise: Secondary | ICD-10-CM | POA: Diagnosis not present

## 2013-01-15 DIAGNOSIS — R0989 Other specified symptoms and signs involving the circulatory and respiratory systems: Secondary | ICD-10-CM | POA: Diagnosis not present

## 2013-01-16 DIAGNOSIS — E7800 Pure hypercholesterolemia, unspecified: Secondary | ICD-10-CM | POA: Diagnosis not present

## 2013-01-16 DIAGNOSIS — I359 Nonrheumatic aortic valve disorder, unspecified: Secondary | ICD-10-CM | POA: Diagnosis not present

## 2013-01-16 DIAGNOSIS — I10 Essential (primary) hypertension: Secondary | ICD-10-CM | POA: Diagnosis not present

## 2013-01-17 DIAGNOSIS — Z961 Presence of intraocular lens: Secondary | ICD-10-CM | POA: Diagnosis not present

## 2013-01-17 DIAGNOSIS — H5210 Myopia, unspecified eye: Secondary | ICD-10-CM | POA: Diagnosis not present

## 2013-01-17 DIAGNOSIS — H04129 Dry eye syndrome of unspecified lacrimal gland: Secondary | ICD-10-CM | POA: Diagnosis not present

## 2013-01-17 DIAGNOSIS — H04229 Epiphora due to insufficient drainage, unspecified lacrimal gland: Secondary | ICD-10-CM | POA: Diagnosis not present

## 2013-01-22 DIAGNOSIS — M2550 Pain in unspecified joint: Secondary | ICD-10-CM | POA: Diagnosis not present

## 2013-01-22 DIAGNOSIS — Z79899 Other long term (current) drug therapy: Secondary | ICD-10-CM | POA: Diagnosis not present

## 2013-01-22 DIAGNOSIS — M069 Rheumatoid arthritis, unspecified: Secondary | ICD-10-CM | POA: Diagnosis not present

## 2013-01-24 DIAGNOSIS — R351 Nocturia: Secondary | ICD-10-CM | POA: Diagnosis not present

## 2013-01-24 DIAGNOSIS — N401 Enlarged prostate with lower urinary tract symptoms: Secondary | ICD-10-CM | POA: Diagnosis not present

## 2013-01-24 DIAGNOSIS — I330 Acute and subacute infective endocarditis: Secondary | ICD-10-CM | POA: Diagnosis not present

## 2013-01-24 DIAGNOSIS — N138 Other obstructive and reflux uropathy: Secondary | ICD-10-CM | POA: Diagnosis not present

## 2013-02-08 DIAGNOSIS — R351 Nocturia: Secondary | ICD-10-CM | POA: Diagnosis not present

## 2013-02-08 DIAGNOSIS — R350 Frequency of micturition: Secondary | ICD-10-CM | POA: Diagnosis not present

## 2013-02-12 DIAGNOSIS — G4730 Sleep apnea, unspecified: Secondary | ICD-10-CM | POA: Diagnosis not present

## 2013-03-06 DIAGNOSIS — Z23 Encounter for immunization: Secondary | ICD-10-CM | POA: Diagnosis not present

## 2013-04-12 DIAGNOSIS — R350 Frequency of micturition: Secondary | ICD-10-CM | POA: Diagnosis not present

## 2013-04-23 DIAGNOSIS — M47812 Spondylosis without myelopathy or radiculopathy, cervical region: Secondary | ICD-10-CM | POA: Diagnosis not present

## 2013-04-23 DIAGNOSIS — Z79899 Other long term (current) drug therapy: Secondary | ICD-10-CM | POA: Diagnosis not present

## 2013-04-23 DIAGNOSIS — M069 Rheumatoid arthritis, unspecified: Secondary | ICD-10-CM | POA: Diagnosis not present

## 2013-04-23 DIAGNOSIS — M2550 Pain in unspecified joint: Secondary | ICD-10-CM | POA: Diagnosis not present

## 2013-04-23 DIAGNOSIS — IMO0002 Reserved for concepts with insufficient information to code with codable children: Secondary | ICD-10-CM | POA: Diagnosis not present

## 2013-05-17 DIAGNOSIS — R351 Nocturia: Secondary | ICD-10-CM | POA: Diagnosis not present

## 2013-05-17 DIAGNOSIS — N3941 Urge incontinence: Secondary | ICD-10-CM | POA: Diagnosis not present

## 2013-05-17 DIAGNOSIS — R350 Frequency of micturition: Secondary | ICD-10-CM | POA: Diagnosis not present

## 2013-05-24 DIAGNOSIS — N3941 Urge incontinence: Secondary | ICD-10-CM | POA: Diagnosis not present

## 2013-05-31 DIAGNOSIS — N3941 Urge incontinence: Secondary | ICD-10-CM | POA: Diagnosis not present

## 2013-05-31 DIAGNOSIS — I10 Essential (primary) hypertension: Secondary | ICD-10-CM | POA: Diagnosis not present

## 2013-05-31 DIAGNOSIS — Z79899 Other long term (current) drug therapy: Secondary | ICD-10-CM | POA: Diagnosis not present

## 2013-06-14 DIAGNOSIS — N3941 Urge incontinence: Secondary | ICD-10-CM | POA: Diagnosis not present

## 2013-06-21 DIAGNOSIS — N3941 Urge incontinence: Secondary | ICD-10-CM | POA: Diagnosis not present

## 2013-07-05 DIAGNOSIS — R351 Nocturia: Secondary | ICD-10-CM | POA: Diagnosis not present

## 2013-07-05 DIAGNOSIS — N3941 Urge incontinence: Secondary | ICD-10-CM | POA: Diagnosis not present

## 2013-07-12 DIAGNOSIS — N3941 Urge incontinence: Secondary | ICD-10-CM | POA: Diagnosis not present

## 2013-07-19 DIAGNOSIS — N3941 Urge incontinence: Secondary | ICD-10-CM | POA: Diagnosis not present

## 2013-07-24 ENCOUNTER — Ambulatory Visit: Payer: Medicare Other | Admitting: Cardiology

## 2013-07-24 ENCOUNTER — Other Ambulatory Visit: Payer: Self-pay | Admitting: Cardiology

## 2013-07-24 MED ORDER — SIMVASTATIN 20 MG PO TABS: 20.0000 mg | ORAL_TABLET | Freq: Every day | ORAL | Status: DC

## 2013-07-25 DIAGNOSIS — N3941 Urge incontinence: Secondary | ICD-10-CM | POA: Diagnosis not present

## 2013-08-01 ENCOUNTER — Encounter: Payer: Self-pay | Admitting: Sports Medicine

## 2013-08-01 ENCOUNTER — Ambulatory Visit (INDEPENDENT_AMBULATORY_CARE_PROVIDER_SITE_OTHER): Payer: Medicare Other | Admitting: Sports Medicine

## 2013-08-01 VITALS — BP 143/74 | HR 60 | Ht 64.96 in | Wt 160.9 lb

## 2013-08-01 DIAGNOSIS — M719 Bursopathy, unspecified: Secondary | ICD-10-CM

## 2013-08-01 DIAGNOSIS — M67919 Unspecified disorder of synovium and tendon, unspecified shoulder: Secondary | ICD-10-CM | POA: Diagnosis not present

## 2013-08-01 DIAGNOSIS — M75101 Unspecified rotator cuff tear or rupture of right shoulder, not specified as traumatic: Secondary | ICD-10-CM

## 2013-08-01 MED ORDER — TRAMADOL HCL 50 MG PO TABS: 50.0000 mg | ORAL_TABLET | Freq: Every evening | ORAL | Status: DC | PRN

## 2013-08-01 MED ORDER — METHYLPREDNISOLONE ACETATE 40 MG/ML IJ SUSP
40.0000 mg | Freq: Once | INTRAMUSCULAR | Status: AC
  Administered 2013-08-01: 40 mg via INTRA_ARTICULAR

## 2013-08-01 NOTE — Progress Notes (Signed)
Subjective:    Patient ID: [name], male    DOB: 10-Nov-1927, 80 y.o.   MRN: [id number]  HPI: [name] is an 80yo right-hand-dominant male who presents to clinic for right shoulder pain off and on for 1 year, present constantly and worse for the past 3 months especialy at night. Of note, pt speaks limited [place] (primary language is [place]) and his daughter acts as his interpreter today. His pain started about 1 year ago after doing exercise / push-ups, which he has stopped in the last several weeks due to his increased pain. He does still do some light weight resistance exercise (10 lb dumb-bells through various upper arm motions) as well as pull-downs and shoulder ab- and adduction / flexion with a machine; however, all of these exercises are limited by his pain. Pt states he can't reach behind himself with his right hand and his sleep is greatly limited by his pain. His pain is all over his right shoulder and down into his arm, but not past his elbow and he denies numbness / tingling in his hands. He does have pain in his hands secondary to long-standing rheumatoid arthritis in both hands, for which he takes methotrexate and ibuprofen, neither of which help his shoulder pain. He has not tried ice or heat for his pain.  PMH: high blood pressure, BPH, HLD, RA of hands as above Medications / allergies: Reviewed, see relevant sections of EMR  Surg Hx: no history of prior shoulder surgery Fam Hx: noncontributory; ?other family members with  Soc hx: widowed, retired, lives alone with daughter nearby; former smoker.  Review of Systems: As above in HPI. Otherwise negative as relates to above issues.     Objective:   Physical Exam BP 143/74  Pulse 60  Ht 5' 4.96" (1.65 m)  Wt 160 lb 15 oz (73 kg)  BMI 26.81 kg/m2 Gen: elderly adult male in NAD, very pleasant Pulm: normal WOB, speaks in full sentences Skin: warm, dry, intact, with no rashes MSK:  Pt has normal appearance of shoulders and  elbows bilaterally with only mild anterior shoulder tenderness on the right  No AC joint tenderness and only very mild soft tissue tenderness in the anterior / superior portion of shoulder  No neck / posterior shoulder tenderness to palpation  ROM of right shoulder full other than extension; pt unable to place hand behind back, only able to bring thumb level with ASIS  Otherwise exhibits mildly painful arc with abduction and flexion on the right but able to place both hands behind head  Does exhibit positive empty can test and Yergason's test on the right and increased pain with resistance against abduction with hand in empty can position  Negative Neer and O'Brien test on the right  Otherwise left shoulder with normal appearance and painless full ROM  Neurovascular:   Alert, oriented though limited direct conversation due to language barrier  Grossly intact sensation throughout bilateral UE  Strength is 5/5 in all planes of the right shoulder but pain increased with motions as above  Strength 5/5 in all planes on the left at the shoulder and elbow and in grip  Hands warm to touch, distal pulses palpable / symmetric bilaterally     Assessment & Plan:  Impression of right rotator cuff pathology (likely partial thickness tear(s) as strength is 5/5 in all planes); also possible but less likely contribution of osteoarthritis or RA in the shoulder. Pt has had xrays of his c-spine (brought on CD  today) and shoulder (not on today's CD) but does not appear to definitely need MRI at this time.  Pt given right subacromial shoulder injection today (see procedure note below). Gave handout and reviewed rotator cuff exercises and demonstrated their use with green and blue resistance bands. Advised continued exercise as tolerated otherwise with strict avoidance of any overhead exercising, for now. Also gave Rx for tramadol 50 mg qHS #40 with no refills with instructions to take only at night to help with sleep.  Plan to f/u in 3-4 weeks; if symptoms worse or not improved, would consider [place] and/or MRI.  Left Shoulder Subacromial Injection Procedure Note: Consent obtained and verified; pt's daughter signed for pt due to language barrier. Time-out conducted. Noted no overlying erythema, induration, or other signs of local infection. Skin prepped in a sterile fashion using alcohol swabs. Topical analgesic spray: Ethyl chloride. Joint: left subacromial space Needle: 25g 1.5 inch Completed without difficulty. Meds: Depo-Medrol 1 mL, (40 mg) + Xylocaine 1%, 3 mL Simple adhesive bandage applied. Pt tolerated well with no immediate complications. Advised to call if fevers/chills, erythema, induration, drainage, or persistent bleeding.  The above was discussed in its entirety with attending Dr. Micheline Chapman. Emmaline Kluver, MD PGY-2, [facility] 08/01/2013, 1:23 PM

## 2013-08-01 NOTE — Patient Instructions (Signed)
Thank you for coming in, today! You probably have some strain of your rotator cuff in your right shoulder.  Do your exercises with the exercise band 2-3 times per day. You can continue doing your other exercises but don't reach up over your head with them. Bring in your xrays with you next time. Come back to see Dr. Micheline Chapman in 3-4 weeks. --Dr. Venetia Maxon

## 2013-08-02 DIAGNOSIS — N3941 Urge incontinence: Secondary | ICD-10-CM | POA: Diagnosis not present

## 2013-08-09 DIAGNOSIS — N3941 Urge incontinence: Secondary | ICD-10-CM | POA: Diagnosis not present

## 2013-08-16 DIAGNOSIS — R350 Frequency of micturition: Secondary | ICD-10-CM | POA: Diagnosis not present

## 2013-08-16 DIAGNOSIS — R351 Nocturia: Secondary | ICD-10-CM | POA: Diagnosis not present

## 2013-08-16 DIAGNOSIS — N3941 Urge incontinence: Secondary | ICD-10-CM | POA: Diagnosis not present

## 2013-08-22 DIAGNOSIS — M069 Rheumatoid arthritis, unspecified: Secondary | ICD-10-CM | POA: Diagnosis not present

## 2013-08-22 DIAGNOSIS — Z79899 Other long term (current) drug therapy: Secondary | ICD-10-CM | POA: Diagnosis not present

## 2013-08-22 DIAGNOSIS — M2550 Pain in unspecified joint: Secondary | ICD-10-CM | POA: Diagnosis not present

## 2013-08-23 DIAGNOSIS — M069 Rheumatoid arthritis, unspecified: Secondary | ICD-10-CM | POA: Diagnosis not present

## 2013-08-26 ENCOUNTER — Encounter: Payer: Self-pay | Admitting: *Deleted

## 2013-08-27 ENCOUNTER — Ambulatory Visit: Payer: Medicare Other | Admitting: Cardiology

## 2013-09-02 ENCOUNTER — Other Ambulatory Visit (HOSPITAL_COMMUNITY): Payer: Self-pay

## 2013-09-03 ENCOUNTER — Encounter (HOSPITAL_COMMUNITY): Payer: Self-pay | Admitting: Cardiology

## 2013-09-06 DIAGNOSIS — N3941 Urge incontinence: Secondary | ICD-10-CM | POA: Diagnosis not present

## 2013-09-27 DIAGNOSIS — N3941 Urge incontinence: Secondary | ICD-10-CM | POA: Diagnosis not present

## 2013-10-09 DIAGNOSIS — M779 Enthesopathy, unspecified: Secondary | ICD-10-CM | POA: Diagnosis not present

## 2013-10-10 DIAGNOSIS — M7500 Adhesive capsulitis of unspecified shoulder: Secondary | ICD-10-CM | POA: Diagnosis not present

## 2013-10-15 DIAGNOSIS — M7500 Adhesive capsulitis of unspecified shoulder: Secondary | ICD-10-CM | POA: Diagnosis not present

## 2013-10-22 DIAGNOSIS — M7500 Adhesive capsulitis of unspecified shoulder: Secondary | ICD-10-CM | POA: Diagnosis not present

## 2013-10-24 DIAGNOSIS — M7500 Adhesive capsulitis of unspecified shoulder: Secondary | ICD-10-CM | POA: Diagnosis not present

## 2013-10-29 DIAGNOSIS — M7500 Adhesive capsulitis of unspecified shoulder: Secondary | ICD-10-CM | POA: Diagnosis not present

## 2013-11-07 ENCOUNTER — Encounter: Payer: Self-pay | Admitting: Cardiology

## 2013-11-07 ENCOUNTER — Ambulatory Visit (INDEPENDENT_AMBULATORY_CARE_PROVIDER_SITE_OTHER): Payer: Medicare Other | Admitting: Cardiology

## 2013-11-07 VITALS — BP 114/56 | HR 64 | Ht 66.0 in | Wt 172.0 lb

## 2013-11-07 DIAGNOSIS — I10 Essential (primary) hypertension: Secondary | ICD-10-CM

## 2013-11-07 DIAGNOSIS — I359 Nonrheumatic aortic valve disorder, unspecified: Secondary | ICD-10-CM | POA: Diagnosis not present

## 2013-11-07 NOTE — Patient Instructions (Signed)
Your physician has requested that you have an echocardiogram. Echocardiography is a painless test that uses sound waves to create images of your heart. It provides your doctor with information about the size and shape of your heart and how well your heart's chambers and valves are working. This procedure takes approximately one hour. There are no restrictions for this procedure.  Your physician wants you to follow-up in: 6 months with Dr. Marlou Porch. You will receive a reminder letter in the mail two months in advance. If you don't receive a letter, please call our office to schedule the follow-up appointment.

## 2013-11-07 NOTE — Progress Notes (Signed)
[facility]. [address], [address] [place], [place]  [address] Phone: [phone] Fax:  [phone]  Date:  11/07/2013   ID:  [name], DOB 1946/78/19, MRN [id number]  PCP:  [name], MD   History of Present Illness: [name] is a 80 y.o. male with moderate aortic stenosis, February 2012 right and left heart catheterization by Dr. Radford Pax showing no evidence of CAD, normal right-sided pressures, moderate aortic stenosis with a mean gradient of 18 mm mercury, performed after chest pain and shortness of breath. Widowed as of March 2012.  Aortic stenosis is moderate. We are monitoring closely.  Shortness of breath is improved with activity, conditioning.  Wt Readings from Last 3 Encounters:  11/07/13 172 lb (78.019 kg)  08/01/13 160 lb 15 oz (73 kg)     Past Medical History  Diagnosis Date  . Depressive disorder, not elsewhere classified   . Other and unspecified hyperlipidemia   . Unspecified essential hypertension   . Vasovagal syncope 2002    history of   . Nephrolithiasis     history of  . Acute gastroenteritis 2002    history of  . Gilbert's syndrome   . Heart murmur   . Thyroid nodule   . Moderate aortic stenosis 2012  . History of BPH   . RA (rheumatoid arthritis)     of both hands  . Rotator cuff syndrome of right shoulder 08/01/2013    Past Surgical History  Procedure Laterality Date  . Cardiac catheterization Bilateral 2012    Current Outpatient Prescriptions  Medication Sig Dispense Refill  . AVODART 0.5 MG capsule Take 0.5 mg by mouth daily.      Marland Kitchen DIOVAN 80 MG tablet Take 80 mg by mouth daily.      . hydrochlorothiazide (MICROZIDE) 12.5 MG capsule Take 12.5 mg by mouth daily.      Marland Kitchen ibuprofen (ADVIL,MOTRIN) 800 MG tablet Take 800 mg by mouth as needed.      . methotrexate (RHEUMATREX) 2.5 MG tablet Take 2.5 mg by mouth once a week.      . simvastatin (ZOCOR) 20 MG tablet Take 1 tablet (20 mg total) by mouth at bedtime.  30 tablet  5   . traMADol (ULTRAM) 50 MG tablet Take 1 tablet (50 mg total) by mouth at bedtime as needed.  40 tablet  0   No current facility-administered medications for this visit.    Allergies:    Allergies  Allergen Reactions  . Citrus Rash    Small rash after eating for several days in a row  . Eggs Or Egg-Derived Products Rash    Small rash after eating for several days in a row    Social History:  The patient  reports that he has quit smoking. He started smoking about 40 years ago. He has never used smokeless tobacco. He reports that he drinks alcohol. He reports that he does not use illicit drugs.   Family History  Problem Relation Age of Onset  . Diabetes Brother   . Diabetes Brother   . Breast cancer Sister   . CVA Father   . Pulmonary embolism Mother 16    caused by complications of surgery    ROS:  Please see the history of present illness.   No chest pain, no syncope, no orthopnea, no PND.     PHYSICAL EXAM: VS:  BP 114/56  Pulse 64  Ht 5\' 6"  (1.676 m)  Wt 172 lb (78.019  kg)  BMI 27.77 kg/m2 Well nourished, well developed, in no acute distress HEENT: normal, [place]/AT, EOMI Neck: no JVD, normal carotid upstroke, no bruit Cardiac:  normal S1, S2; RRR; 2/6 S murmurRUSB Lungs:  clear to auscultation bilaterally, no wheezing, rhonchi or rales Abd: soft, nontender, no hepatomegaly, no bruits Ext: no edema, 2+ distal pulses Skin: warm and dry GU: deferred Neuro: no focal abnormalities noted, AAO x 3  EKG:  11/07/13-sinus rhythm, 64, no other significant abnormalities, prominent T wave in one   ECHO: 08/30/12 -  1. There is mild concentric left ventricle hypertrophy. 2. Left ventricular ejection fraction estimated by 2D at 65-70 percent. 3. There were no regional wall motion abnormalities. 4. Mild left atrial enlargement. 5. Mild mitral annular calcification. 6. Mildly thickened mitral valve. 7. There is mild tricuspid regurgitation. 8. Normal estimated right ventricular  systolic pressure. 9. Moderate calcification of the aortic valve. 10. Moderate aortic valve stenosis. 11. Moderate aortic valve regurgitation. 3.65m/s peak velocity, 82mmHg mean gradient. Prior 3.29m/s, 28mmHg. 12. Trace aortic valve regurgitation. 13. Analysis of mitral valve inflow, pulmonary vein Doppler and tissue Doppler suggests grade Ia diastolic dysfunction with elevated left atrial pressure.    ASSESSMENT AND PLAN:  1. Moderate aortic stenosis-I will check echocardiogram. Is been over a year. Still having some mild shortness of breath but this seems to improve with conditioning. 2. Hypertension-Dr. Felipa Eth has been monitoring. Medications reviewed. Doing well. 3. Six-month followup  Signed, Candee Furbish, MD [facility]  11/07/2013 3:53 PM

## 2013-11-26 ENCOUNTER — Ambulatory Visit (HOSPITAL_COMMUNITY): Payer: Medicare Other | Attending: Cardiology | Admitting: Cardiology

## 2013-11-26 DIAGNOSIS — I359 Nonrheumatic aortic valve disorder, unspecified: Secondary | ICD-10-CM

## 2013-11-26 DIAGNOSIS — I059 Rheumatic mitral valve disease, unspecified: Secondary | ICD-10-CM | POA: Diagnosis not present

## 2013-11-26 DIAGNOSIS — E785 Hyperlipidemia, unspecified: Secondary | ICD-10-CM | POA: Diagnosis not present

## 2013-11-26 DIAGNOSIS — I079 Rheumatic tricuspid valve disease, unspecified: Secondary | ICD-10-CM | POA: Insufficient documentation

## 2013-11-26 DIAGNOSIS — I10 Essential (primary) hypertension: Secondary | ICD-10-CM | POA: Diagnosis not present

## 2013-11-26 HISTORY — PX: TRANSTHORACIC ECHOCARDIOGRAM: SHX275

## 2013-11-26 NOTE — Progress Notes (Signed)
Echo performed. 

## 2013-12-05 ENCOUNTER — Ambulatory Visit (INDEPENDENT_AMBULATORY_CARE_PROVIDER_SITE_OTHER): Payer: Medicare Other | Admitting: *Deleted

## 2013-12-05 DIAGNOSIS — E7800 Pure hypercholesterolemia, unspecified: Secondary | ICD-10-CM

## 2013-12-05 DIAGNOSIS — I359 Nonrheumatic aortic valve disorder, unspecified: Secondary | ICD-10-CM

## 2013-12-05 LAB — LIPID PANEL
CHOL/HDL RATIO: 3
CHOLESTEROL: 169 mg/dL (ref 0–200)
HDL: 51.8 mg/dL (ref >39.00)
LDL CALC: 90 mg/dL (ref 0–99)
NonHDL: 117.2
TRIGLYCERIDES: 136 mg/dL (ref 0.0–149.0)
VLDL: 27.2 mg/dL (ref 0.0–40.0)

## 2013-12-05 LAB — ALT: ALT: 25 U/L (ref 0–53)

## 2013-12-09 ENCOUNTER — Telehealth: Payer: Self-pay | Admitting: Cardiology

## 2013-12-09 DIAGNOSIS — Z1331 Encounter for screening for depression: Secondary | ICD-10-CM | POA: Diagnosis not present

## 2013-12-09 DIAGNOSIS — Z79899 Other long term (current) drug therapy: Secondary | ICD-10-CM | POA: Diagnosis not present

## 2013-12-09 DIAGNOSIS — K921 Melena: Secondary | ICD-10-CM | POA: Diagnosis not present

## 2013-12-09 DIAGNOSIS — Z23 Encounter for immunization: Secondary | ICD-10-CM | POA: Diagnosis not present

## 2013-12-09 DIAGNOSIS — Z0000 Encounter for general adult medical examination without abnormal findings: Secondary | ICD-10-CM | POA: Diagnosis not present

## 2013-12-09 NOTE — Telephone Encounter (Signed)
Patient is returning call for lab results. Please call and advise.

## 2013-12-10 DIAGNOSIS — K921 Melena: Secondary | ICD-10-CM | POA: Diagnosis not present

## 2013-12-10 DIAGNOSIS — Z79899 Other long term (current) drug therapy: Secondary | ICD-10-CM | POA: Diagnosis not present

## 2013-12-10 DIAGNOSIS — M069 Rheumatoid arthritis, unspecified: Secondary | ICD-10-CM | POA: Diagnosis not present

## 2013-12-10 NOTE — Telephone Encounter (Signed)
Rodena Piety left message for pt to call back for results.

## 2013-12-16 ENCOUNTER — Encounter: Payer: Self-pay | Admitting: *Deleted

## 2013-12-16 NOTE — Telephone Encounter (Signed)
Letter of results mailed to pts home address.  Requested he call back with any questions or concerns.

## 2014-01-01 ENCOUNTER — Ambulatory Visit (INDEPENDENT_AMBULATORY_CARE_PROVIDER_SITE_OTHER): Payer: Medicare Other | Admitting: General Surgery

## 2014-01-02 ENCOUNTER — Other Ambulatory Visit: Payer: Self-pay

## 2014-01-20 ENCOUNTER — Ambulatory Visit (INDEPENDENT_AMBULATORY_CARE_PROVIDER_SITE_OTHER): Payer: Medicare Other | Admitting: General Surgery

## 2014-01-20 VITALS — BP 140/60 | HR 61 | Temp 97.8°F | Ht 66.0 in | Wt 171.0 lb

## 2014-01-20 DIAGNOSIS — K625 Hemorrhage of anus and rectum: Secondary | ICD-10-CM | POA: Diagnosis not present

## 2014-01-20 DIAGNOSIS — Z961 Presence of intraocular lens: Secondary | ICD-10-CM | POA: Diagnosis not present

## 2014-01-20 DIAGNOSIS — H04129 Dry eye syndrome of unspecified lacrimal gland: Secondary | ICD-10-CM | POA: Diagnosis not present

## 2014-01-20 NOTE — Progress Notes (Signed)
Chief Complaint  Patient presents with  . Hemorrhoids    HISTORY: [name] is a 80 y.o. male who presents to the office with rectal bleeding.  Other symptoms include nothing.  This had been occurring for a couple months.  he has tried nothing in the past.  Nothing makes the symptoms worse.   It is intermittent in nature.  his bowel habits are regular and his bowel movements are usually hard.  his fiber intake is dietary.  his last colonoscopy was about 5 yrs and was normal per patient's daughter.  He does report prolapsing tissue.      Past Medical History  Diagnosis Date  . Depressive disorder, not elsewhere classified   . Other and unspecified hyperlipidemia   . Unspecified essential hypertension   . Vasovagal syncope 2002    history of   . Nephrolithiasis     history of  . Acute gastroenteritis 2002    history of  . Gilbert's syndrome   . Heart murmur   . Thyroid nodule   . Moderate aortic stenosis 2012  . History of BPH   . RA (rheumatoid arthritis)     of both hands  . Rotator cuff syndrome of right shoulder 08/01/2013      Past Surgical History  Procedure Laterality Date  . Cardiac catheterization Bilateral 2012        Current Outpatient Prescriptions  Medication Sig Dispense Refill  . AVODART 0.5 MG capsule Take 0.5 mg by mouth daily.      Marland Kitchen DIOVAN 80 MG tablet Take 80 mg by mouth daily.      . hydrochlorothiazide (MICROZIDE) 12.5 MG capsule Take 12.5 mg by mouth daily.      Marland Kitchen ibuprofen (ADVIL,MOTRIN) 800 MG tablet Take 800 mg by mouth as needed.      . methotrexate (RHEUMATREX) 2.5 MG tablet Take 2.5 mg by mouth once a week.      . simvastatin (ZOCOR) 20 MG tablet Take 1 tablet (20 mg total) by mouth at bedtime.  30 tablet  5  . traMADol (ULTRAM) 50 MG tablet Take 1 tablet (50 mg total) by mouth at bedtime as needed.  40 tablet  0   No current facility-administered medications for this visit.      Allergies  Allergen Reactions  . Citrus Rash    Small rash  after eating for several days in a row  . Eggs Or Egg-Derived Products Rash    Small rash after eating for several days in a row      Family History  Problem Relation Age of Onset  . Diabetes Brother   . Diabetes Brother   . Breast cancer Sister   . CVA Father   . Pulmonary embolism Mother 49    caused by complications of surgery    History   Social History  . Marital Status: Widowed    Spouse Name: N/A    Number of Children: N/A  . Years of Education: N/A   Social History Main Topics  . Smoking status: Former Smoker    Start date: 06/13/1973  . Smokeless tobacco: Never Used  . Alcohol Use: Yes     Comment: occasional  . Drug Use: No  . Sexual Activity: Not on file   Other Topics Concern  . Not on file   Social History Narrative  . No narrative on file      REVIEW OF SYSTEMS - PERTINENT POSITIVES ONLY: Review of Systems - General ROS: negative  for - chills, fever or weight loss Hematological and Lymphatic ROS: negative for - bleeding problems, blood clots or bruising Respiratory ROS: no cough, shortness of breath, or wheezing Cardiovascular ROS: no chest pain or dyspnea on exertion Gastrointestinal ROS: no abdominal pain, change in bowel habits, or black or bloody stools Genito-Urinary ROS: no dysuria, trouble voiding, or hematuria  EXAM: Filed Vitals:   01/20/14 1401  BP: 140/60  Pulse: 61  Temp: 97.8 F (36.6 C)    General appearance: alert and cooperative Resp: clear to auscultation bilaterally Cardio: regular rate and rhythm GI: soft, non-tender; bowel sounds normal; no masses,  no organomegaly  Procedure: Anoscopy Surgeon: Marcello Moores Diagnosis: rectal bleeding  Assistant: [name] After the risks and benefits were explained, verbal consent was obtained for above procedure  Anesthesia: none Findings: grade 3 LL hem with inflammation.  Grade 2 RA and RP hemorrhoids.    ASSESSMENT AND PLAN: Advait Buice is a 80 y.o. male with rectal bleeding.   On exam he has several large grade 2, non-inflamed internal hemorrhoids and an inflamed grade 3 internal hemorrhoid.  I have recommended [place].  We discussed banding in the office, but I recommended against this, given his grade 3 hemorrhoid.  The risks of surgery were explained which include bleeding, infection and pain.      [name], MD Colon and Rectal Surgery / [facility]      Visit Diagnoses: 1. Anal bleeding   2. Rectal bleeding     Primary Care Physician: [name], MD

## 2014-01-20 NOTE — Patient Instructions (Signed)

## 2014-01-22 ENCOUNTER — Other Ambulatory Visit: Payer: Self-pay | Admitting: Cardiology

## 2014-02-04 DIAGNOSIS — M069 Rheumatoid arthritis, unspecified: Secondary | ICD-10-CM | POA: Diagnosis not present

## 2014-02-04 DIAGNOSIS — Z79899 Other long term (current) drug therapy: Secondary | ICD-10-CM | POA: Diagnosis not present

## 2014-02-10 DIAGNOSIS — M069 Rheumatoid arthritis, unspecified: Secondary | ICD-10-CM | POA: Diagnosis not present

## 2014-02-10 DIAGNOSIS — M2550 Pain in unspecified joint: Secondary | ICD-10-CM | POA: Diagnosis not present

## 2014-02-18 DIAGNOSIS — M25819 Other specified joint disorders, unspecified shoulder: Secondary | ICD-10-CM | POA: Diagnosis not present

## 2014-02-20 DIAGNOSIS — M2550 Pain in unspecified joint: Secondary | ICD-10-CM | POA: Diagnosis not present

## 2014-02-20 DIAGNOSIS — M069 Rheumatoid arthritis, unspecified: Secondary | ICD-10-CM | POA: Diagnosis not present

## 2014-02-20 DIAGNOSIS — Z79899 Other long term (current) drug therapy: Secondary | ICD-10-CM | POA: Diagnosis not present

## 2014-02-21 ENCOUNTER — Other Ambulatory Visit (INDEPENDENT_AMBULATORY_CARE_PROVIDER_SITE_OTHER): Payer: Self-pay | Admitting: General Surgery

## 2014-03-12 DIAGNOSIS — Z23 Encounter for immunization: Secondary | ICD-10-CM | POA: Diagnosis not present

## 2014-03-12 DIAGNOSIS — H9190 Unspecified hearing loss, unspecified ear: Secondary | ICD-10-CM | POA: Diagnosis not present

## 2014-03-12 DIAGNOSIS — I10 Essential (primary) hypertension: Secondary | ICD-10-CM | POA: Diagnosis not present

## 2014-03-12 DIAGNOSIS — H9209 Otalgia, unspecified ear: Secondary | ICD-10-CM | POA: Diagnosis not present

## 2014-03-13 DIAGNOSIS — H93291 Other abnormal auditory perceptions, right ear: Secondary | ICD-10-CM | POA: Diagnosis not present

## 2014-03-13 DIAGNOSIS — H9201 Otalgia, right ear: Secondary | ICD-10-CM | POA: Diagnosis not present

## 2014-03-13 DIAGNOSIS — H905 Unspecified sensorineural hearing loss: Secondary | ICD-10-CM | POA: Diagnosis not present

## 2014-03-13 DIAGNOSIS — H9121 Sudden idiopathic hearing loss, right ear: Secondary | ICD-10-CM | POA: Diagnosis not present

## 2014-04-01 DIAGNOSIS — M25511 Pain in right shoulder: Secondary | ICD-10-CM | POA: Diagnosis not present

## 2014-04-03 ENCOUNTER — Encounter (HOSPITAL_BASED_OUTPATIENT_CLINIC_OR_DEPARTMENT_OTHER): Payer: Self-pay | Admitting: *Deleted

## 2014-04-04 ENCOUNTER — Encounter (HOSPITAL_BASED_OUTPATIENT_CLINIC_OR_DEPARTMENT_OTHER): Payer: Self-pay | Admitting: *Deleted

## 2014-04-04 NOTE — Progress Notes (Signed)
SPOKE W/ PT DAUGHTER, DORA, WHOM WILL BE INTERPRETOR. PT SPEAKS RUSSIAN. NPO AFTER MN. ARRIVE AT 0730. NEEDS ISTAT. CURRENT EKG IN CHART AND EPIC. WILL TAKE DIOVAN AM DOS W/ SIPS OF WATER.

## 2014-04-04 NOTE — Progress Notes (Signed)
04/04/14 1220  OBSTRUCTIVE SLEEP APNEA  Have you ever been diagnosed with sleep apnea through a sleep study? No  Do you snore loudly (loud enough to be heard through closed doors)?  1  Do you often feel tired, fatigued, or sleepy during the daytime? 0  Has anyone observed you stop breathing during your sleep? 0  Do you have, or are you being treated for high blood pressure? 1  BMI more than 35 kg/m2? 0  Age over 78 years old? 1  Neck circumference greater than 40 cm/16 inches? 0  Gender: 1  Obstructive Sleep Apnea Score 4  Score 4 or greater  Results sent to PCP

## 2014-04-09 ENCOUNTER — Encounter (HOSPITAL_BASED_OUTPATIENT_CLINIC_OR_DEPARTMENT_OTHER): Payer: Medicare Other | Admitting: Anesthesiology

## 2014-04-09 ENCOUNTER — Ambulatory Visit (HOSPITAL_BASED_OUTPATIENT_CLINIC_OR_DEPARTMENT_OTHER)
Admission: RE | Admit: 2014-04-09 | Discharge: 2014-04-09 | Disposition: A | Discharge status: Home / Self Care | Payer: Medicare Other | Source: Ambulatory Visit | Attending: General Surgery | Admitting: General Surgery

## 2014-04-09 ENCOUNTER — Encounter (HOSPITAL_BASED_OUTPATIENT_CLINIC_OR_DEPARTMENT_OTHER): Payer: Self-pay | Admitting: *Deleted

## 2014-04-09 ENCOUNTER — Encounter (HOSPITAL_BASED_OUTPATIENT_CLINIC_OR_DEPARTMENT_OTHER)
Admission: RE | Disposition: A | Discharge status: Home / Self Care | Payer: Self-pay | Source: Ambulatory Visit | Attending: General Surgery

## 2014-04-09 ENCOUNTER — Ambulatory Visit (HOSPITAL_BASED_OUTPATIENT_CLINIC_OR_DEPARTMENT_OTHER): Payer: Medicare Other | Admitting: Anesthesiology

## 2014-04-09 DIAGNOSIS — I10 Essential (primary) hypertension: Secondary | ICD-10-CM | POA: Insufficient documentation

## 2014-04-09 DIAGNOSIS — E041 Nontoxic single thyroid nodule: Secondary | ICD-10-CM | POA: Insufficient documentation

## 2014-04-09 DIAGNOSIS — E785 Hyperlipidemia, unspecified: Secondary | ICD-10-CM | POA: Diagnosis not present

## 2014-04-09 DIAGNOSIS — K648 Other hemorrhoids: Secondary | ICD-10-CM | POA: Insufficient documentation

## 2014-04-09 DIAGNOSIS — Z91018 Allergy to other foods: Secondary | ICD-10-CM | POA: Insufficient documentation

## 2014-04-09 DIAGNOSIS — R011 Cardiac murmur, unspecified: Secondary | ICD-10-CM | POA: Diagnosis not present

## 2014-04-09 DIAGNOSIS — Z87891 Personal history of nicotine dependence: Secondary | ICD-10-CM | POA: Diagnosis not present

## 2014-04-09 DIAGNOSIS — K649 Unspecified hemorrhoids: Secondary | ICD-10-CM | POA: Diagnosis not present

## 2014-04-09 DIAGNOSIS — M069 Rheumatoid arthritis, unspecified: Secondary | ICD-10-CM | POA: Diagnosis not present

## 2014-04-09 DIAGNOSIS — Z91012 Allergy to eggs: Secondary | ICD-10-CM | POA: Insufficient documentation

## 2014-04-09 DIAGNOSIS — F329 Major depressive disorder, single episode, unspecified: Secondary | ICD-10-CM | POA: Diagnosis not present

## 2014-04-09 DIAGNOSIS — I350 Nonrheumatic aortic (valve) stenosis: Secondary | ICD-10-CM | POA: Diagnosis not present

## 2014-04-09 HISTORY — DX: Benign prostatic hyperplasia without lower urinary tract symptoms: N40.0

## 2014-04-09 HISTORY — DX: Essential (primary) hypertension: I10

## 2014-04-09 HISTORY — DX: Other hemorrhoids: K64.8

## 2014-04-09 HISTORY — DX: Diverticulosis of large intestine without perforation or abscess without bleeding: K57.30

## 2014-04-09 HISTORY — DX: Presence of dental prosthetic device (complete) (partial): Z97.2

## 2014-04-09 HISTORY — DX: Depression, unspecified: F32.A

## 2014-04-09 HISTORY — DX: Major depressive disorder, single episode, unspecified: F32.9

## 2014-04-09 HISTORY — PX: TRANSANAL HEMORRHOIDAL DEARTERIALIZATION: SHX6136

## 2014-04-09 HISTORY — DX: Personal history of urinary calculi: Z87.442

## 2014-04-09 LAB — POCT I-STAT 4, (NA,K, GLUC, HGB,HCT)
Glucose, Bld: 116 mg/dL — ABNORMAL HIGH (ref 70–99)
HCT: 41 % (ref 39.0–52.0)
Hemoglobin: 13.9 g/dL (ref 13.0–17.0)
POTASSIUM: 4.6 meq/L (ref 3.7–5.3)
SODIUM: 142 meq/L (ref 137–147)

## 2014-04-09 SURGERY — TRANSANAL HEMORRHOIDAL DEARTERIALIZATION
Anesthesia: General | Site: Anus

## 2014-04-09 MED ORDER — LIDOCAINE HCL (CARDIAC) 20 MG/ML IV SOLN
INTRAVENOUS | Status: DC | PRN
  Administered 2014-04-09: 30 mg via INTRAVENOUS

## 2014-04-09 MED ORDER — SODIUM CHLORIDE 0.9 % IV SOLN
250.0000 mL | INTRAVENOUS | Status: DC | PRN
  Filled 2014-04-09: qty 250

## 2014-04-09 MED ORDER — LACTATED RINGERS IV SOLN
INTRAVENOUS | Status: DC
  Administered 2014-04-09: 08:00:00 via INTRAVENOUS
  Filled 2014-04-09: qty 1000

## 2014-04-09 MED ORDER — BUPIVACAINE LIPOSOME 1.3 % IJ SUSP
INTRAMUSCULAR | Status: DC | PRN
  Administered 2014-04-09: 20 mL

## 2014-04-09 MED ORDER — OXYCODONE HCL 5 MG/5ML PO SOLN
5.0000 mg | Freq: Once | ORAL | Status: DC | PRN
  Filled 2014-04-09: qty 5

## 2014-04-09 MED ORDER — HYDROCODONE-ACETAMINOPHEN 5-325 MG PO TABS: 1.0000 | ORAL_TABLET | ORAL | Status: DC | PRN

## 2014-04-09 MED ORDER — OXYCODONE HCL 5 MG PO TABS
5.0000 mg | ORAL_TABLET | Freq: Once | ORAL | Status: DC | PRN
  Filled 2014-04-09: qty 1

## 2014-04-09 MED ORDER — HYDROMORPHONE HCL 1 MG/ML IJ SOLN
0.2500 mg | INTRAMUSCULAR | Status: DC | PRN
  Filled 2014-04-09: qty 1

## 2014-04-09 MED ORDER — OXYCODONE HCL 5 MG PO TABS
5.0000 mg | ORAL_TABLET | ORAL | Status: DC | PRN
  Filled 2014-04-09: qty 2

## 2014-04-09 MED ORDER — LACTATED RINGERS IV SOLN
INTRAVENOUS | Status: DC | PRN
  Administered 2014-04-09: 08:00:00 via INTRAVENOUS

## 2014-04-09 MED ORDER — SODIUM CHLORIDE 0.9 % IJ SOLN
3.0000 mL | Freq: Two times a day (BID) | INTRAMUSCULAR | Status: DC
  Filled 2014-04-09: qty 3

## 2014-04-09 MED ORDER — PROMETHAZINE HCL 25 MG/ML IJ SOLN
6.2500 mg | INTRAMUSCULAR | Status: DC | PRN
  Filled 2014-04-09: qty 1

## 2014-04-09 MED ORDER — ACETAMINOPHEN 325 MG PO TABS
650.0000 mg | ORAL_TABLET | ORAL | Status: DC | PRN
  Filled 2014-04-09: qty 2

## 2014-04-09 MED ORDER — FENTANYL CITRATE 0.05 MG/ML IJ SOLN
INTRAMUSCULAR | Status: DC | PRN
  Administered 2014-04-09 (×16): 6.25 ug via INTRAVENOUS

## 2014-04-09 MED ORDER — PROPOFOL 10 MG/ML IV EMUL
INTRAVENOUS | Status: DC | PRN
Start: 2014-04-09 — End: 2014-04-09
  Administered 2014-04-09: 50 ug/kg/min via INTRAVENOUS

## 2014-04-09 MED ORDER — ONDANSETRON HCL 4 MG/2ML IJ SOLN
INTRAMUSCULAR | Status: DC | PRN
  Administered 2014-04-09: 4 mg via INTRAVENOUS

## 2014-04-09 MED ORDER — SODIUM CHLORIDE 0.9 % IJ SOLN
3.0000 mL | INTRAMUSCULAR | Status: DC | PRN
  Filled 2014-04-09: qty 3

## 2014-04-09 MED ORDER — ACETAMINOPHEN 10 MG/ML IV SOLN
INTRAVENOUS | Status: DC | PRN
  Administered 2014-04-09: 1000 mg via INTRAVENOUS

## 2014-04-09 MED ORDER — BUPIVACAINE-EPINEPHRINE 0.5% -1:200000 IJ SOLN
INTRAMUSCULAR | Status: DC | PRN
  Administered 2014-04-09: 30 mL

## 2014-04-09 MED ORDER — FENTANYL CITRATE 0.05 MG/ML IJ SOLN
INTRAMUSCULAR | Status: AC
  Filled 2014-04-09: qty 4

## 2014-04-09 MED ORDER — ACETAMINOPHEN 650 MG RE SUPP
650.0000 mg | RECTAL | Status: DC | PRN
  Filled 2014-04-09: qty 1

## 2014-04-09 MED ORDER — MEPERIDINE HCL 25 MG/ML IJ SOLN
6.2500 mg | INTRAMUSCULAR | Status: DC | PRN
  Filled 2014-04-09: qty 1

## 2014-04-09 SURGICAL SUPPLY — 40 items
BLADE HEX COATED 2.75 (ELECTRODE) ×2 IMPLANT
BRIEF STRETCH FOR OB PAD LRG (UNDERPADS AND DIAPERS) ×2 IMPLANT
COVER TABLE BACK 60X90 (DRAPES) ×2 IMPLANT
DECANTER SPIKE VIAL GLASS SM (MISCELLANEOUS) ×2 IMPLANT
DRAPE LG THREE QUARTER DISP (DRAPES) ×2 IMPLANT
DRAPE UTILITY XL STRL (DRAPES) ×2 IMPLANT
DRSG PAD ABDOMINAL 8X10 ST (GAUZE/BANDAGES/DRESSINGS) ×2 IMPLANT
ELECT REM PT RETURN 9FT ADLT (ELECTROSURGICAL) ×2
ELECTRODE REM PT RTRN 9FT ADLT (ELECTROSURGICAL) ×1 IMPLANT
GAUZE SPONGE 4X4 12PLY STRL (GAUZE/BANDAGES/DRESSINGS) IMPLANT
GAUZE SPONGE 4X4 16PLY XRAY LF (GAUZE/BANDAGES/DRESSINGS) ×2 IMPLANT
GLOVE BIO SURGEON STRL SZ 6.5 (GLOVE) ×2 IMPLANT
GLOVE BIOGEL M 6.5 STRL (GLOVE) ×2 IMPLANT
GLOVE BIOGEL M 7.0 STRL (GLOVE) ×2 IMPLANT
GLOVE BIOGEL PI IND STRL 6.5 (GLOVE) ×1 IMPLANT
GLOVE BIOGEL PI IND STRL 7.0 (GLOVE) ×2 IMPLANT
GLOVE BIOGEL PI INDICATOR 6.5 (GLOVE) ×1
GLOVE BIOGEL PI INDICATOR 7.0 (GLOVE) ×2
GOWN STRL REUS W/TWL 2XL LVL3 (GOWN DISPOSABLE) ×2 IMPLANT
GOWN STRL REUS W/TWL LRG LVL3 (GOWN DISPOSABLE) ×2 IMPLANT
GOWN STRL REUS W/TWL XL LVL3 (GOWN DISPOSABLE) IMPLANT
HEMOSTAT SURGICEL 4X8 (HEMOSTASIS) IMPLANT
KIT SLIDE ONE PROLAPS HEMORR (KITS) ×2 IMPLANT
LEGGING LITHOTOMY PAIR STRL (DRAPES) ×2 IMPLANT
LUBRICANT JELLY K Y 4OZ (MISCELLANEOUS) ×2 IMPLANT
NEEDLE HYPO 22GX1.5 SAFETY (NEEDLE) ×2 IMPLANT
PACK BASIN DAY SURGERY FS (CUSTOM PROCEDURE TRAY) ×2 IMPLANT
PENCIL BUTTON HOLSTER BLD 10FT (ELECTRODE) ×2 IMPLANT
SPONGE GAUZE 4X4 12PLY STER LF (GAUZE/BANDAGES/DRESSINGS) ×2 IMPLANT
SPONGE HEMORRHOID 8X3CM (HEMOSTASIS) IMPLANT
SUT CHROMIC 2 0 SH (SUTURE) IMPLANT
SUT CHROMIC 3 0 SH 27 (SUTURE) IMPLANT
SUT VIC AB 2-0 UR6 27 (SUTURE) IMPLANT
SYR CONTROL 10ML LL (SYRINGE) ×2 IMPLANT
SYRINGE 20CC LL (MISCELLANEOUS) ×2 IMPLANT
TOWEL OR 17X24 6PK STRL BLUE (TOWEL DISPOSABLE) ×4 IMPLANT
TOWEL OR NON WOVEN STRL DISP B (DISPOSABLE) ×2 IMPLANT
TRAY DSU PREP LF (CUSTOM PROCEDURE TRAY) ×2 IMPLANT
TUBE CONNECTING 12X1/4 (SUCTIONS) ×2 IMPLANT
YANKAUER SUCT BULB TIP NO VENT (SUCTIONS) ×2 IMPLANT

## 2014-04-09 NOTE — Op Note (Signed)
04/09/2014  10:09 AM  PATIENT:  [name]  80 y.o. male  Patient Care Team: Lajean Manes, MD as PCP - General (Internal Medicine)  PRE-OPERATIVE DIAGNOSIS:  BLEEDING INTERNAL HEMORRHOIDS  POST-OPERATIVE DIAGNOSIS:  BLEEDING INTERNAL HEMORRHOIDS  PROCEDURE:  TRANSANAL HEMORRHOIDAL DEARTERIALIZATION OF INTERNAL HEMORROIDS  Surgeon(s): Leighton Ruff, MD  ASSISTANT: none   ANESTHESIA:   local and MAC  SPECIMEN:  No Specimen  DISPOSITION OF SPECIMEN:  N/A  COUNTS:  YES  PLAN OF CARE: Discharge to home after PACU  PATIENT DISPOSITION:  PACU - hemodynamically stable.  INDICATION: This is a 80 y.o. M with bleeding internal hemorrhoids, grade 3.     OR FINDINGS: Large prolapsing RA and LL hemorrhoids  DESCRIPTION: the patient was identified in the preoperative holding area and taken to the OR where they were laid on the operating room table.  MAC anesthesia was induced without difficulty. The patient was then positioned in lithotomy position.  The patient was then prepped and draped in usual sterile fashion.  SCDs were noted to be in place prior to the initiation of anesthesia. A surgical timeout was performed indicating the correct patient, procedure, positioning and need for preoperative antibiotics.  A rectal block was performed using Marcaine with epinephrine.    I began with a digital rectal exam.  There were no abnormal masses.  I then placed a Hill-Ferguson anoscope into the anal canal and evaluated this completely.  There were grade 3 RA and LL internal hemorrhoids.      I switched over to the [facility] fiberoptically lit Doppler anocope.   Using the Doppler on the tip of the Cranberry Boyle anoscope, I identified the arterial hemorrhoidal vessels coming in in the classic hexagonal anatomical pattern (right posterior/lateral/anterior, left posterior /lateral/anterior).    I proceeded to ligate the hemorrhoidal arteries. I used a 2-0 Vicryl suture on a UR-6 needle in a figure-of-eight  fashion over the signal around 6 cm proximal to the anal verge. I then ran that stitch longitudinally more distally to the white line of [place]. I then tied that stitch down to cause a hemorrhoidopexy. I did that for all 6 locations.    I redid Doppler anoscopy. I Identified a signal at the no other locations.  At completion of this, all hemorrhoids were reduced into the rectum.  There is no more prolapse. External anatomy looked normal.  I repeated anoscopy and examination.   Hemostasis was good.  Patient is being extubated go to recovery room.  I am about to discuss the patient's status to the family.

## 2014-04-09 NOTE — Discharge Instructions (Addendum)
ANORECTAL SURGERY: POST OP INSTRUCTIONS 1. Take your usually prescribed home medications unless otherwise directed. 2. DIET: During the first few hours after surgery sip on some liquids until you are able to urinate.  It is normal to not urinate for several hours after this surgery.  If you feel uncomfortable, please contact the office for instructions.  After you are able to urinate,you may eat, if you feel like it.  Follow a light bland diet the first 24 hours after arrival home, such as soup, liquids, crackers, etc.  Be sure to include lots of fluids daily (6-8 glasses).  Avoid fast food or heavy meals, as your are more likely to get nauseated.  Eat a low fat diet the next few days after surgery.  Limit caffeine intake to 1-2 servings a day. 3. PAIN CONTROL: a. Pain is best controlled by a usual combination of several different methods TOGETHER: i. Muscle relaxation: Soak in a warm bath (or Sitz bath) three times a day and after bowel movements.  Continue to do this until all pain is resolved.  ii. Over the counter pain medication iii. Prescription pain medication b. Most patients will experience some swelling and discomfort in the anus/rectal area and incisions.  Heat such as warm towels, sitz baths, warm baths, etc to help relax tight/sore spots and speed recovery.  Some people prefer to use ice, especially in the first couple days after surgery, as it may decrease the pain and swelling, or alternate between ice & heat.  Experiment to what works for you.  Swelling and bruising can take several weeks to resolve.  Pain can take even longer to completely resolve. c. It is helpful to take an over-the-counter pain medication regularly for the first few weeks.  Choose one of the following that works best for you: i. Naproxen (Aleve, etc)  Two 220mg  tabs twice a day ii. Ibuprofen (Advil, etc) Three 200mg  tabs four times a day (every meal & bedtime) d. A  prescription for pain medication (such as percocet,  oxycodone, hydrocodone, etc) should be given to you upon discharge.  Take your pain medication as prescribed.  i. If you are having problems/concerns with the prescription medicine (does not control pain, nausea, vomiting, rash, itching, etc), please call us [phone] to see if we need to switch you to a different pain medicine that will work better for you and/or control your side effect better. ii. If you need a refill on your pain medication, please contact your pharmacy.  They will contact our office to request authorization. Prescriptions will not be filled after 5 pm or on week-ends. 4. KEEP YOUR BOWELS REGULAR and AVOID CONSTIPATION a. The goal is one to two soft bowel movements a day.  You should at least have a bowel movement every other day. b. Avoid getting constipated.  Between the surgery and the pain medications, it is common to experience some constipation. This can be very painful after rectal surgery.  Increasing fluid intake and taking a fiber supplement (such as Metamucil, Citrucel, FiberCon, etc) 1-2 times a day regularly will usually help prevent this problem from occurring.  A stool softener like colace is also recommended.  This can be purchased over the counter at your pharmacy.  You can take it up to 3 times a day.  If you do not have a bowel movement after 24 hrs since your surgery, take one does of milk of magnesia.  If you still haven't had a bowel movement 8-12 hours  after that dose, take another dose.  If you don't have a bowel movement 48 hrs after surgery, purchase a Fleets enema from the drug store and administer gently per package instructions.  If you still are having trouble with your bowel movements after that, please call the office for further instructions. °c. If you develop diarrhea or have many loose bowel movements, simplify your diet to bland foods & liquids for a few days.  Stop any stool softeners and decrease your fiber supplement.  Switching to mild  anti-diarrheal medications (Kayopectate, Pepto Bismol) can help.  If this worsens or does not improve, please call us. ° °5. Wound Care °a. Remove your bandages before your first bowel movement or 8 hours after surgery.     °b. Remove any wound packing material at this tim,e as well.  You do not need to repack the wound unless instructed otherwise.  Wear an absorbent pad or soft cotton gauze in your underwear to catch any drainage and help keep the area clean. You should change this every 2-3 hours while awake. °c. Keep the area clean and dry.  Bathe / shower every day, especially after bowel movements.  Keep the area clean by showering / bathing over the incision / wound.   It is okay to soak an open wound to help wash it.  Wet wipes or showers / gentle washing after bowel movements is often less traumatic than regular toilet paper. °d. You may have some styrofoam-like soft packing in the rectum which will come out with the first bowel movement.  °e. You will often notice bleeding with bowel movements.  This should slow down by the end of the first week of surgery °f. Expect some drainage.  This should slow down, too, by the end of the first week of surgery.  Wear an absorbent pad or soft cotton gauze in your underwear until the drainage stops. °g. Do Not sit on a rubber or pillow ring.  This can make you symptoms worse.  You may sit on a soft pillow if needed.  °6. ACTIVITIES as tolerated:   °a. You may resume regular (light) daily activities beginning the next day--such as daily self-care, walking, climbing stairs--gradually increasing activities as tolerated.  If you can walk 30 minutes without difficulty, it is safe to try more intense activity such as jogging, treadmill, bicycling, low-impact aerobics, swimming, etc. °b. Save the most intensive and strenuous activity for last such as sit-ups, heavy lifting, contact sports, etc  Refrain from any heavy lifting or straining until you are off narcotics for pain  control.   °c. You may drive when you are no longer taking prescription pain medication, you can comfortably sit for long periods of time, and you can safely maneuver your car and apply brakes. °d. You may have sexual intercourse when it is comfortable.  °7. FOLLOW UP in our office °a. Please call [facility] at [phone] to set up an appointment to see your surgeon in the office for a follow-up appointment approximately 3-4 weeks after your surgery. °b. Make sure that you call for this appointment the day you arrive home to insure a convenient appointment time. °10. IF YOU HAVE DISABILITY OR FAMILY LEAVE FORMS, BRING THEM TO THE OFFICE FOR PROCESSING.  DO NOT GIVE THEM TO YOUR DOCTOR. ° ° ° ° °WHEN TO CALL US [phone]: °1. Poor pain control °2. Reactions / problems with new medications (rash/itching, nausea, etc)  °3. Fever over 101.5 F (38.5 C) °  4. Inability to urinate 5. Nausea and/or vomiting 6. Worsening swelling or bruising 7. Continued bleeding from incision. 8. Increased pain, redness, or drainage from the incision  The clinic staff is available to answer your questions during regular business hours (8:30am-5pm).  Please dont hesitate to call and ask to speak to one of our nurses for clinical concerns.   A surgeon from [facility] is always on call at the hospitals   If you have a medical emergency, go to the nearest emergency room or call 911.    [facility], [address], [place], [place], [place]  [address] ? MAIN: [phone] ? TOLL FREE: [phone] ? FAX [phone] [email or web address]    Post Anesthesia Home Care Instructions  Activity: Get plenty of rest for the remainder of the day. A responsible adult should stay with you for 24 hours following the procedure.  For the next 24 hours, DO NOT: -Drive a car -Paediatric nurse -Drink alcoholic beverages -Take any medication unless instructed by your physician -Make  any legal decisions or sign important papers.  Meals: Start with liquid foods such as gelatin or soup. Progress to regular foods as tolerated. Avoid greasy, spicy, heavy foods. If nausea and/or vomiting occur, drink only clear liquids until the nausea and/or vomiting subsides. Call your physician if vomiting continues. Special Instructions/Symptoms: Your throat may feel dry or sore from the anesthesia or the breathing tube placed in your throat during surgery. If this causes discomfort, gargle with warm salt water. The discomfort should disappear within 24 hours.  Information for Discharge Teaching: EXPAREL (bupivacaine liposome injectable suspension)   Your surgeon gave you EXPAREL(bupivacaine) in your surgical incision to help control your pain after surgery.   EXPAREL is a local anesthetic that provides pain relief by numbing the tissue around the surgical site.  EXPAREL is designed to release pain medication over time and can control pain for up to 72 hours.  Depending on how you respond to EXPAREL, you may require less pain medication during your recovery.  Possible side effects:  Temporary loss of sensation or ability to move in the area where bupivacaine was injected.  Nausea, vomiting, constipation  Rarely, numbness and tingling in your mouth or lips, lightheadedness, or anxiety may occur.  Call your doctor right away if you think you may be experiencing any of these sensations, or if you have other questions regarding possible side effects.  Follow all other discharge instructions given to you by your surgeon or nurse. Eat a healthy diet and drink plenty of water or other fluids.  If you return to the hospital for any reason within 96 hours following the administration of EXPAREL, please inform your health care providers.

## 2014-04-09 NOTE — Anesthesia Preprocedure Evaluation (Addendum)
Anesthesia Evaluation  Patient identified by MRN, date of birth, ID band Patient awake    Reviewed: Allergy & Precautions, H&P , NPO status , Patient's Chart, lab work & pertinent test results  Airway        Dental   Pulmonary former smoker,          Cardiovascular hypertension, Pt. on medications + Valvular Problems/Murmurs AS  Echo 11/2013 Normal biventricular size and function.   Moderate aortic stenosis.   Mild mitral regurgitation.   No prior study available for comparison.    Neuro/Psych PSYCHIATRIC DISORDERS Depression negative neurological ROS     GI/Hepatic negative GI ROS, Neg liver ROS,   Endo/Other  negative endocrine ROS  Renal/GU negative Renal ROS     Musculoskeletal  (+) Arthritis -,   Abdominal   Peds  Hematology negative hematology ROS (+)   Anesthesia Other Findings   Reproductive/Obstetrics negative OB ROS                           Anesthesia Physical Anesthesia Plan  ASA: III  Anesthesia Plan: General   Post-op Pain Management:    Induction: Intravenous  Airway Management Planned: LMA and Oral ETT  Additional Equipment:   Intra-op Plan:   Post-operative Plan: Extubation in OR  Informed Consent: I have reviewed the patients History and Physical, chart, labs and discussed the procedure including the risks, benefits and alternatives for the proposed anesthesia with the patient or authorized representative who has indicated his/her understanding and acceptance.   Dental advisory given  Plan Discussed with: CRNA  Anesthesia Plan Comments:         Anesthesia Quick Evaluation

## 2014-04-09 NOTE — H&P (Signed)
Chief Complaint   Patient presents with   .  Hemorrhoids   HISTORY: [name] is a 80 y.o. male who presents to the office with rectal bleeding. Other symptoms include nothing. This had been occurring for a couple months. he has tried nothing in the past. Nothing makes the symptoms worse. It is intermittent in nature. his bowel habits are regular and his bowel movements are usually hard. his fiber intake is dietary. his last colonoscopy was about 5 yrs and was normal per patient's daughter. He does report prolapsing tissue.  Past Medical History   Diagnosis  Date   .  Depressive disorder, not elsewhere classified    .  Other and unspecified hyperlipidemia    .  Unspecified essential hypertension    .  Vasovagal syncope  2002     history of   .  Nephrolithiasis      history of   .  Acute gastroenteritis  2002     history of   .  Gilbert's syndrome    .  Heart murmur    .  Thyroid nodule    .  Moderate aortic stenosis  2012   .  History of BPH    .  RA (rheumatoid arthritis)      of both hands   .  Rotator cuff syndrome of right shoulder  08/01/2013    Past Surgical History   Procedure  Laterality  Date   .  Cardiac catheterization  Bilateral  2012    Current Outpatient Prescriptions   Medication  Sig  Dispense  Refill   .  AVODART 0.5 MG capsule  Take 0.5 mg by mouth daily.     Marland Kitchen  DIOVAN 80 MG tablet  Take 80 mg by mouth daily.     .  hydrochlorothiazide (MICROZIDE) 12.5 MG capsule  Take 12.5 mg by mouth daily.     Marland Kitchen  ibuprofen (ADVIL,MOTRIN) 800 MG tablet  Take 800 mg by mouth as needed.     .  methotrexate (RHEUMATREX) 2.5 MG tablet  Take 2.5 mg by mouth once a week.     .  simvastatin (ZOCOR) 20 MG tablet  Take 1 tablet (20 mg total) by mouth at bedtime.  30 tablet  5   .  traMADol (ULTRAM) 50 MG tablet  Take 1 tablet (50 mg total) by mouth at bedtime as needed.  40 tablet  0    No current facility-administered medications for this visit.    Allergies   Allergen   Reactions   .  Citrus  Rash     Small rash after eating for several days in a row   .  Eggs Or Egg-Derived Products  Rash     Small rash after eating for several days in a row    Family History   Problem  Relation  Age of Onset   .  Diabetes  Brother    .  Diabetes  Brother    .  Breast cancer  Sister    .  CVA  Father    .  Pulmonary embolism  Mother  35     caused by complications of surgery    History    Social History   .  Marital Status:  Widowed     Spouse Name:  N/A     Number of Children:  N/A   .  Years of Education:  N/A    Social History Main Topics   .  Smoking status:  Former Smoker     Start date:  06/13/1973   .  Smokeless tobacco:  Never Used   .  Alcohol Use:  Yes      Comment: occasional   .  Drug Use:  No   .  Sexual Activity:  Not on file    Other Topics  Concern   .  Not on file    Social History Narrative   .  No narrative on file   REVIEW OF SYSTEMS - PERTINENT POSITIVES ONLY:  Review of Systems - General ROS: negative for - chills, fever or weight loss  Hematological and Lymphatic ROS: negative for - bleeding problems, blood clots or bruising  Respiratory ROS: no cough, shortness of breath, or wheezing  Cardiovascular ROS: no chest pain or dyspnea on exertion  Gastrointestinal ROS: no abdominal pain, change in bowel habits, or black or bloody stools  Genito-Urinary ROS: no dysuria, trouble voiding, or hematuria  EXAM:  BP 127/54  Pulse 50  Temp(Src) 97.5 F (36.4 C) (Oral)  Resp 16  Wt 167 lb 8 oz (75.978 kg)  SpO2 100% General appearance: alert and cooperative  Resp: clear to auscultation bilaterally  Cardio: regular rate and rhythm  GI: soft, non-tender; bowel sounds normal; no masses, no organomegaly  Procedure: Anoscopy  Surgeon: [name]  Diagnosis: rectal bleeding  Assistant: [name]  After the risks and benefits were explained, verbal consent was obtained for above procedure  Anesthesia: none  Findings: grade 3 LL hem with  inflammation. Grade 2 RA and RP hemorrhoids.  ASSESSMENT AND PLAN:  [name] is a 80 y.o. male with rectal bleeding. On exam he has several large grade 2, non-inflamed internal hemorrhoids and an inflamed grade 3 internal hemorrhoid. I have recommended [name]. We discussed banding in the office, but I recommended against this, given his grade 3 hemorrhoid. The risks of surgery were explained which include bleeding, infection and pain.  Ryan Adie, MD  Colon and Rectal Surgery / [facility]  Visit Diagnoses:  1.  Anal bleeding   2.  Rectal bleeding   Primary Care Physician:  [name], MD

## 2014-04-09 NOTE — Transfer of Care (Addendum)
Immediate Anesthesia Transfer of Care Note  Patient: [name]  Procedure(s) Performed: Procedure(s) (LRB): TRANSANAL HEMORRHOIDAL DEARTERIALIZATION OF INTERNAL HEMORROIDS (N/A)  Patient Location: PACU  Anesthesia Type: MAC  Level of Consciousness: awake, sedated, patient cooperative and responds to stimulation  Airway & Oxygen Therapy: Patient Spontanous Breathing and Patient connected to face mask oxygen  Post-op Assessment: Report given to PACU RN, Post -op Vital signs reviewed and stable and Patient moving all extremities  Post vital signs: Reviewed and stable  Complications: No apparent anesthesia complications

## 2014-04-09 NOTE — Anesthesia Postprocedure Evaluation (Signed)
Anesthesia Post Note  Patient: [name]  Procedure(s) Performed: Procedure(s) (LRB): TRANSANAL HEMORRHOIDAL DEARTERIALIZATION OF INTERNAL HEMORROIDS (N/A)  Anesthesia type: MAC  Patient location: PACU  Post pain: Pain level controlled  Post assessment: Post-op Vital signs reviewed  Last Vitals: BP 130/58  Pulse 57  Temp(Src) 36.4 C (Oral)  Resp 18  Wt 167 lb 8 oz (75.978 kg)  SpO2 95%  Post vital signs: Reviewed  Level of consciousness: awake  Complications: No apparent anesthesia complications

## 2014-04-10 ENCOUNTER — Encounter (HOSPITAL_COMMUNITY): Payer: Self-pay | Admitting: Emergency Medicine

## 2014-04-10 ENCOUNTER — Emergency Department (HOSPITAL_COMMUNITY)
Admission: EM | Admit: 2014-04-10 | Discharge: 2014-04-10 | Disposition: A | Discharge status: Home / Self Care | Payer: Medicare Other | Attending: Emergency Medicine | Admitting: Emergency Medicine

## 2014-04-10 DIAGNOSIS — H9190 Unspecified hearing loss, unspecified ear: Secondary | ICD-10-CM | POA: Insufficient documentation

## 2014-04-10 DIAGNOSIS — N3289 Other specified disorders of bladder: Secondary | ICD-10-CM

## 2014-04-10 DIAGNOSIS — M069 Rheumatoid arthritis, unspecified: Secondary | ICD-10-CM | POA: Diagnosis not present

## 2014-04-10 DIAGNOSIS — Y92002 Bathroom of unspecified non-institutional (private) residence single-family (private) house as the place of occurrence of the external cause: Secondary | ICD-10-CM | POA: Insufficient documentation

## 2014-04-10 DIAGNOSIS — I10 Essential (primary) hypertension: Secondary | ICD-10-CM | POA: Diagnosis not present

## 2014-04-10 DIAGNOSIS — Y9389 Activity, other specified: Secondary | ICD-10-CM | POA: Diagnosis not present

## 2014-04-10 DIAGNOSIS — R400 Somnolence: Secondary | ICD-10-CM | POA: Diagnosis present

## 2014-04-10 DIAGNOSIS — Z043 Encounter for examination and observation following other accident: Secondary | ICD-10-CM | POA: Diagnosis not present

## 2014-04-10 DIAGNOSIS — Z87891 Personal history of nicotine dependence: Secondary | ICD-10-CM | POA: Insufficient documentation

## 2014-04-10 DIAGNOSIS — Z8639 Personal history of other endocrine, nutritional and metabolic disease: Secondary | ICD-10-CM | POA: Insufficient documentation

## 2014-04-10 DIAGNOSIS — Z8659 Personal history of other mental and behavioral disorders: Secondary | ICD-10-CM | POA: Insufficient documentation

## 2014-04-10 DIAGNOSIS — W19XXXA Unspecified fall, initial encounter: Secondary | ICD-10-CM

## 2014-04-10 DIAGNOSIS — N400 Enlarged prostate without lower urinary tract symptoms: Secondary | ICD-10-CM | POA: Insufficient documentation

## 2014-04-10 DIAGNOSIS — K594 Anal spasm: Secondary | ICD-10-CM | POA: Diagnosis not present

## 2014-04-10 DIAGNOSIS — R011 Cardiac murmur, unspecified: Secondary | ICD-10-CM | POA: Diagnosis not present

## 2014-04-10 DIAGNOSIS — Z9189 Other specified personal risk factors, not elsewhere classified: Secondary | ICD-10-CM | POA: Insufficient documentation

## 2014-04-10 DIAGNOSIS — R3919 Other difficulties with micturition: Secondary | ICD-10-CM | POA: Diagnosis not present

## 2014-04-10 DIAGNOSIS — W1830XA Fall on same level, unspecified, initial encounter: Secondary | ICD-10-CM | POA: Insufficient documentation

## 2014-04-10 DIAGNOSIS — R109 Unspecified abdominal pain: Secondary | ICD-10-CM

## 2014-04-10 DIAGNOSIS — Z87442 Personal history of urinary calculi: Secondary | ICD-10-CM | POA: Diagnosis not present

## 2014-04-10 DIAGNOSIS — Z79899 Other long term (current) drug therapy: Secondary | ICD-10-CM | POA: Diagnosis not present

## 2014-04-10 DIAGNOSIS — I959 Hypotension, unspecified: Secondary | ICD-10-CM | POA: Diagnosis not present

## 2014-04-10 LAB — CBC
HCT: 36.8 % — ABNORMAL LOW (ref 39.0–52.0)
HEMOGLOBIN: 11.8 g/dL — AB (ref 13.0–17.0)
MCH: 29.4 pg (ref 26.0–34.0)
MCHC: 32.1 g/dL (ref 30.0–36.0)
MCV: 91.5 fL (ref 78.0–100.0)
Platelets: 160 10*3/uL (ref 150–400)
RBC: 4.02 MIL/uL — ABNORMAL LOW (ref 4.22–5.81)
RDW: 13.4 % (ref 11.5–15.5)
WBC: 10.3 10*3/uL (ref 4.0–10.5)

## 2014-04-10 LAB — BASIC METABOLIC PANEL
Anion gap: 12 (ref 5–15)
BUN: 14 mg/dL (ref 6–23)
CHLORIDE: 100 meq/L (ref 96–112)
CO2: 27 mEq/L (ref 19–32)
Calcium: 9 mg/dL (ref 8.4–10.5)
Creatinine, Ser: 0.93 mg/dL (ref 0.50–1.35)
GFR calc non Af Amer: 75 mL/min — ABNORMAL LOW (ref >90)
GFR, EST AFRICAN AMERICAN: 87 mL/min — AB (ref >90)
GLUCOSE: 122 mg/dL — AB (ref 70–99)
POTASSIUM: 3.6 meq/L — AB (ref 3.7–5.3)
SODIUM: 139 meq/L (ref 137–147)

## 2014-04-10 LAB — I-STAT CHEM 8, ED
BUN: 15 mg/dL (ref 6–23)
CHLORIDE: 100 meq/L (ref 96–112)
Calcium, Ion: 1.16 mmol/L (ref 1.13–1.30)
Creatinine, Ser: 1 mg/dL (ref 0.50–1.35)
Glucose, Bld: 124 mg/dL — ABNORMAL HIGH (ref 70–99)
HEMATOCRIT: 40 % (ref 39.0–52.0)
Hemoglobin: 13.6 g/dL (ref 13.0–17.0)
POTASSIUM: 3.5 meq/L — AB (ref 3.7–5.3)
SODIUM: 139 meq/L (ref 137–147)
TCO2: 28 mmol/L (ref 0–100)

## 2014-04-10 LAB — I-STAT TROPONIN, ED: TROPONIN I, POC: 0 ng/mL (ref 0.00–0.08)

## 2014-04-10 MED ORDER — FENTANYL CITRATE 0.05 MG/ML IJ SOLN
100.0000 ug | INTRAMUSCULAR | Status: DC | PRN
  Administered 2014-04-10: 100 ug via INTRAVENOUS

## 2014-04-10 MED ORDER — DIAZEPAM 5 MG PO TABS: 5.0000 mg | ORAL_TABLET | Freq: Four times a day (QID) | ORAL | Status: DC | PRN

## 2014-04-10 MED ORDER — ONDANSETRON HCL 4 MG/2ML IJ SOLN
4.0000 mg | Freq: Once | INTRAMUSCULAR | Status: AC
  Administered 2014-04-10: 4 mg via INTRAVENOUS
  Filled 2014-04-10: qty 2

## 2014-04-10 MED ORDER — FENTANYL CITRATE 0.05 MG/ML IJ SOLN
INTRAMUSCULAR | Status: AC
  Filled 2014-04-10: qty 2

## 2014-04-10 MED ORDER — DIAZEPAM 5 MG PO TABS
5.0000 mg | ORAL_TABLET | Freq: Once | ORAL | Status: AC
  Administered 2014-04-10: 5 mg via ORAL
  Filled 2014-04-10: qty 1

## 2014-04-10 MED ORDER — BISACODYL 10 MG RE SUPP: 10.0000 mg | Freq: Once | RECTAL | Status: DC

## 2014-04-10 NOTE — ED Notes (Signed)
Pt asked to use restroom; pt on toilet and able to have small BM and urinate

## 2014-04-10 NOTE — ED Notes (Signed)
Dr. Wentz back at the bedside.  

## 2014-04-10 NOTE — ED Notes (Signed)
Daughter [name] at bedside.

## 2014-04-10 NOTE — ED Notes (Signed)
Pt arrives via EMS from home. Daughter states that she heard a thud and checked on her dad and found him on the floor by the toilet. Pt does not recall event. Pt had hernia repair surgery 2 days ago at [facility] and has had trouble having a bowel movement and trouble urinating since. EMS reports that pt was pale and clammy upon their arrival. 12 lead showed SB 56. HR 56-60. BP  128/62 99% RA. CBG 118. EMS gave 4mg  Zofran when pt c/o nausea. 20 LH placed by EMS.

## 2014-04-10 NOTE — Discharge Instructions (Signed)
Drink plenty of fluids, and take your medication as directed. The diazepam can be used 3-4 times a day, either one half or one whole pill, as needed for spasms.     ???????????? ??????? ? ??????????? ???????????? ???? (Fall Prevention and Home Safety) ?????? ? ?????????? ??????? ????????? ?? ????? ?????? ? ????? ????????. ????? ??????????? ??? ??????? ??? ?? ???????? ????? ??????? ? ???????? ????????. ?????????? ????? ??????? ?????, ??????? ???????? ??? ??????? ???? ??? ??????????.  ?????????? ????? ?????   ??????????????? ??????? ? ??? ? ????????? ? ????? ????????.  ?????????? ?? ??????? ??????? ???????.  ?????????? ????????? ????? ???????? ?????? ? ????.  ?????????? ?????????? ???????? ?????????.  ??????? ? ???????? ???????????, ?????, ????? ? ????.  ?????????, ??? ?????? ????? ???????? ?? ??????? ? ?????? ??????????. ??? ??????? ???????? ?????? ???? ??????????? ????????.  ????????? ???????? ?????? ? ??????? ?? ????? ? ????.  ????? ????????? ??????? ? ??????? ? ???? ?????? ??? ?????.  ? ?????? ??????? ????? ??? ?????? ?????. ?????? ???????   ?????????? ?????? ?????????.  ?????????? ??????? ?????? ????? ??????? ??????? ? ? ????.  ???????? ???????????? ???? ??? ??????? ? ??????? ??????? ??? ?????.  ? ?????? ????????????? ?????????? ??????????? ???????????? ???? ? ??????? ??????.  ?????????? ????????? ???????? ???? ? ???? ?????? ???????.  ????????? ???????? ????????? ???? ? ?????? ??? ? ??????? ???????.  ????????? ??????? ? ?????? ??????? ??? ?????? ???????????? ???????????? ?????? ?????.  ??????? ?????????????? ??????? ? ?????? ??????????? ? ???? ?????? ???????. ???????? ?????????   ?????????? ?????? ?????????.  ?????????? ?? ???????????? ???????? ?????? ???? ????? ????????.  ?????? ???????????? ?????????? ??????, ??????? ?????? ?? ???????, ??? ???????.  ?????????? ?????????? ??????? ????? ???????.  ????? ???? ????? ?????????, ????????? ????? ????????? ?????  ???? ? ??????????????.  ??????? ?????????????? ??????? ? ?????? ??????????? ? ???? ???????. ?????   ???????? ???????? ? ????????? ?????? ???? ?????????? ? ??????? ?????? ?????. ?? ???? ??????????? ??????????? ??????? ??????????.  ?????? ???????? ?????? ?????, ????? ???? ?????????.  ?? ?????? ?? ??????? ????.  ?????? ????????? ??? ????????? ? ??????? ??????? ? ??????.  ?????????? ???????? ???????? ?? ??????? ??????.  ???????? ????? ???????????? ???????? ?????????? ??????????? ?? ??????? ??????.  ? ?????? ?????????????, ????? ?? ???????? ?? ?????-?????? ?????????, ????????? ????? ????? ?? ????.  ??? ??????? ?????????? ????????.  ?? ????????? ??? ????????? ??? ??????, ??? ?????? ??? ?????????. ???? ??? ???? ???????????? ????, ?????? ??????????? ???????????? ???? ??? ????? ?? ????????? ?????????.  ??????? ?????????????? ??????? ? ?????? ??????????? ? ???? ???????. ????????   ??????? ?? ?????????? ???????? ?? ????????.  ?????????? ?????? ?? ????? ???????? ????????. ????????? ?? ??????. ????????? ????????????? ??????. ?????? ?????? ???? ????? ?? ?????, ??? ? ????????.  ???????? ???????? ?????? ???? ??????? ??????????? ? ????. ??????????????? ??????????????? ??? ????????? ????????, ??????? ????????? ??? ?????? ?? ????.  ?? ??????? ??????? ? ?????? ??? ? ???? ???????? ??? ??????? ????????? ??????? ???????????? ??????? ?????? ??? ?????????. ???? ????????, ?????????? ?? ????????.  ?????????? ????????? ? ?????? ? ? ???? ??????????? ??????. ?????????????? ?????? ?? ???????????? ???????   ?????? ????? ?? ?????? ??????? ??? ? ????????? ????????. ????? ?????? ???? ???????. ?????? ????? ? ???????? ???????.  ??? ??????????? ?????????? ????????? ? ???, ??? ??? ????????? ????????, ? ????? ??????? ??????????. ?????? ???????????? ??????????? ??????????.  ?? ?????? ????? ??????? ??????????????? ?????? ??? ????? ?????. ???????? ????? ????? ?? ?????? ? ????????? ??????? ????????.  ?????????  ????????? ???????????? ?????????, ???????, ???? ??? ??????????. ?????????? ????????? ??????.  ????????? ????, ????? ?????. ?????????? ??????? ???????????? ????????. ?????????? ??????????? ? ??????????.  ?????????? ??????, ????? ?? ???????????? ??????. ????????????? ?? ?????? ????? ??????????? ??????.  ?????? ????????? ????? ??????????? ???????????? ?????? ??? ???????????? ???????.  ?????????? ????????? ??????? ?? ??????????? ????.  ??? ?????? ????? ????????? ?????????, ??????? ????????? ????????? ? ??????????? ???????????.  ?????? ?????????, ???? ? ???? ????? ???????? ????????. ?????????????? ?????? ?? ???????? ??????????? ????   ?????????? ??????????? ??????? ???? ?  120 F (48,8 C).  ?????????? ?????? ????? ?????????? ?????? ?????? ?????? ????? ??????????? ????????.  ?????????? ??????? ????????????? ?? ?????? ????? ? ? ???????? ??????????. Document Released: 10/16/2008 Document Revised: 11/29/2011 [facility] Patient Information 2015 [place]. This information is not intended to replace advice given to you by your health care provider. Make sure you discuss any questions you have with your health care provider.

## 2014-04-10 NOTE — ED Provider Notes (Signed)
CSN: [id number]     Arrival date & time 04/10/14  0605 History   First MD Initiated Contact with Patient 04/10/14 [phone]     Chief Complaint  Patient presents with  . Loss of Consciousness     (Consider location/radiation/quality/duration/timing/severity/associated sxs/prior Treatment) HPI  Patient presents for evaluation of difficulty urinating and stooling, followed by an episode of possible syncope. He was in the bathroom with his daughter heard him fall. She came to him, and found him alert. There was no apparent injury. EMS was called, arrived and found him to be nauseated. They treated him with Zofran. His initial vital signs were normal. He had hemorrhoid surgery 2 days ago, as an outpatient. He has been using hydrocodone, without relief of his discomfort. He has not had fever, chest pain, cough, or preceding episodes of syncope. He is unable to specify what happened just prior to the possible syncopal episode. There are no other known modifying factors.  Past Medical History  Diagnosis Date  . Gilbert's syndrome   . Heart murmur   . Thyroid nodule     noted 11-2009  . Moderate aortic stenosis     cardiologist-  dr Marlou Porch  . Rotator cuff syndrome of right shoulder 08/01/2013  . Hypertension   . Depression   . Internal hemorrhoid, bleeding   . History of syncope     2002--  vasovagal  . History of kidney stones   . Diverticulosis of colon   . RA (rheumatoid arthritis)     bilateral hands/ wrist--  seronegative  . History of gastroenteritis   . Nocturia   . BPH (benign prostatic hypertrophy)   . Hearing loss   . Wears dentures   . At risk for sleep apnea     STOP-BANG= 4    SENT TO PCP 04-04-2014   Past Surgical History  Procedure Laterality Date  . Cardiovascular stress test  05/ 2011   dr Marlou Porch    normal low risk perfusion study  . Transthoracic echocardiogram  11-26-2013    moderate focal basal and mild LVH/  ef 81-85%/  grade I diastolic dysfunction/ mild LAE/   moderate calcification with stenosis AV with mild regurg,  gradients 35 abd 58 mmHg /  mild TR  . Cardiac catheterization  07-19-2010  dr Tressia Miners turner    normal coronary arteries,  ef 60%,  moderate aortic stenosis- gradient 14mmHg, normal right heart pressure  . Colonoscopy  2010  approx  . Cataract extraction w/ intraocular lens  implant, bilateral  2013   Family History  Problem Relation Age of Onset  . Diabetes Brother   . Diabetes Brother   . Breast cancer Sister   . CVA Father   . Pulmonary embolism Mother 57    caused by complications of surgery   History  Substance Use Topics  . Smoking status: Former Smoker -- 40 years    Types: Cigarettes    Start date: 06/13/1973    Quit date: 04/03/1981  . Smokeless tobacco: Never Used  . Alcohol Use: Yes     Comment: occasional    Review of Systems  All other systems reviewed and are negative.     Allergies  Citrus and Eggs or egg-derived products  Home Medications   Prior to Admission medications   Medication Sig Start Date End Date Taking? Authorizing Provider  AVODART 0.5 MG capsule Take 0.5 mg by mouth daily. 06/02/13  Yes Historical Provider, MD  Cholecalciferol (VITAMIN D3) 2000 UNITS TABS  Take 1 capsule by mouth daily.   Yes Historical Provider, MD  DIOVAN 80 MG tablet Take 80 mg by mouth every morning.  05/19/13  Yes Historical Provider, MD  hydrochlorothiazide (MICROZIDE) 12.5 MG capsule Take 12.5 mg by mouth daily. 06/04/13  Yes Historical Provider, MD  ibuprofen (ADVIL,MOTRIN) 800 MG tablet Take 800 mg by mouth daily.  06/02/13  Yes Historical Provider, MD  methotrexate (RHEUMATREX) 2.5 MG tablet Take 22.5 mg by mouth once a week. On Monday 07/13/13  Yes Historical Provider, MD  Omega-3 Fatty Acids (FISH OIL) 1000 MG CAPS Take 1 capsule by mouth daily.   Yes Historical Provider, MD  psyllium (METAMUCIL) 58.6 % packet Take 1 packet by mouth daily.   Yes Historical Provider, MD  simvastatin (ZOCOR) 20 MG tablet Take  20 mg by mouth daily.   Yes Historical Provider, MD  HYDROcodone-acetaminophen (NORCO/VICODIN) 5-325 MG per tablet Take 1 tablet by mouth every 4 (four) hours as needed. 96/75/91   Leighton Ruff, MD   BP 138/58  Pulse 63  Temp(Src) 97.6 F (36.4 C) (Oral)  Resp 18  Ht 5' 6.14" (1.68 m)  Wt 167 lb (75.751 kg)  BMI 26.84 kg/m2  SpO2 97% Physical Exam  Nursing note and vitals reviewed. Constitutional: He is oriented to person, place, and time. He appears well-developed and well-nourished.  HENT:  Head: Normocephalic and atraumatic.  Right Ear: External ear normal.  Left Ear: External ear normal.  Eyes: Conjunctivae and EOM are normal. Pupils are equal, round, and reactive to light.  Neck: Normal range of motion and phonation normal. Neck supple.  Cardiovascular: Normal rate and regular rhythm.   Soft left chest heart murmur, systolic.  Pulmonary/Chest: Effort normal and breath sounds normal. He exhibits no bony tenderness.  Abdominal: Soft. He exhibits mass (Consistent with bladder enlargement). There is tenderness (lower, moderate).  Genitourinary:  Fecal soiling present, at initial evaluation. Mild perianal ecchymosis with minimal swelling consistent with recent surgery. No apparent anal stricture. Digital anal examination deferred.  Musculoskeletal: Normal range of motion.  Neurological: He is alert and oriented to person, place, and time. No cranial nerve deficit or sensory deficit. He exhibits normal muscle tone. Coordination normal.  Skin: Skin is warm, dry and intact.  Psychiatric: He has a normal mood and affect. His behavior is normal. Judgment and thought content normal.    ED Course  Procedures (including critical care time)  Medications  fentaNYL (SUBLIMAZE) injection 100 mcg (100 mcg Intravenous Given 04/10/14 0716)  bisacodyl (DULCOLAX) suppository 10 mg (not administered)  ondansetron (ZOFRAN) injection 4 mg (4 mg Intravenous Given 04/10/14 0719)    Patient  Vitals for the past 24 hrs:  BP Temp Temp src Pulse Resp SpO2 Height Weight  04/10/14 0825 138/58 mmHg - - 63 18 97 % - -  04/10/14 0715 129/52 mmHg - - 59 18 100 % - -  04/10/14 0618 - - - - - 92 % 5' 6.14" (1.68 m) 167 lb (75.751 kg)  04/10/14 0616 129/58 mmHg 97.6 F (36.4 C) Oral 62 23 88 % - -    8:53 AM Reevaluation with update and discussion. After initial assessment and treatment, an updated evaluation reveals after fentanyl dosing he was able to spontaneously void, and the bladder scan improved from 200 cc, to 40 cc. He is more comfortable now, but wants something for "spasm". [name] L   09:00- Consult General Surgery- Dr. Marcello Moores recommends Valium 5 mg QID prn spasm  Labs Review Labs Reviewed  CBC - Abnormal; Notable for the following:    RBC 4.02 (*)    Hemoglobin 11.8 (*)    HCT 36.8 (*)    All other components within normal limits  BASIC METABOLIC PANEL - Abnormal; Notable for the following:    Potassium 3.6 (*)    Glucose, Bld 122 (*)    GFR calc non Af Amer 75 (*)    GFR calc Af Amer 87 (*)    All other components within normal limits  I-STAT CHEM 8, ED - Abnormal; Notable for the following:    Potassium 3.5 (*)    Glucose, Bld 124 (*)    All other components within normal limits  URINE CULTURE  URINALYSIS, ROUTINE W REFLEX MICROSCOPIC  I-STAT TROPOININ, ED    Imaging Review No results found.   EKG Interpretation   Date/Time:  Thursday April 10 2014 06:16:59 EDT Ventricular Rate:  59 PR Interval:  185 QRS Duration: 94 QT Interval:  394 QTC Calculation: 390 R Axis:   21 Text Interpretation:  Sinus rhythm Borderline low voltage, extremity leads  Confirmed by Glynn Octave [id number]) on 04/10/2014 6:41:02 AM      MDM   Final diagnoses:  Anal spasm  Bladder spasm    Postsurgical difficulty voiding and stooling related to spasm, likely inflammation related. Doubt cauda equina syndrome, surgical wound infection, metabolic instability.  There is no clear evidence for syncope. There is no apparent injury from a fall.   Nursing Notes Reviewed/ Care Coordinated Applicable Imaging Reviewed Interpretation of Laboratory Data incorporated into ED treatment  The patient appears reasonably screened and/or stabilized for discharge and I doubt any other medical condition or other [facility] requiring further screening, evaluation, or treatment in the ED at this time prior to discharge.  Plan: Home Medications- Valium for spasms- Precautions given; Home Treatments- Fluids, warm soaks; return here if the recommended treatment, does not improve the symptoms; Recommended follow up- PCP and Gen. Surg. prn    Richarda Blade, MD 04/10/14 1011

## 2014-04-10 NOTE — ED Notes (Signed)
Bladder scan showed >200ml

## 2014-04-13 ENCOUNTER — Emergency Department (HOSPITAL_COMMUNITY)
Admission: EM | Admit: 2014-04-13 | Discharge: 2014-04-14 | Disposition: A | Discharge status: Home / Self Care | Payer: Medicare Other | Attending: Emergency Medicine | Admitting: Emergency Medicine

## 2014-04-13 ENCOUNTER — Encounter (HOSPITAL_COMMUNITY): Payer: Self-pay | Admitting: Emergency Medicine

## 2014-04-13 DIAGNOSIS — Z8639 Personal history of other endocrine, nutritional and metabolic disease: Secondary | ICD-10-CM | POA: Diagnosis not present

## 2014-04-13 DIAGNOSIS — Z87891 Personal history of nicotine dependence: Secondary | ICD-10-CM | POA: Diagnosis not present

## 2014-04-13 DIAGNOSIS — R011 Cardiac murmur, unspecified: Secondary | ICD-10-CM | POA: Diagnosis not present

## 2014-04-13 DIAGNOSIS — Z8719 Personal history of other diseases of the digestive system: Secondary | ICD-10-CM | POA: Diagnosis not present

## 2014-04-13 DIAGNOSIS — I10 Essential (primary) hypertension: Secondary | ICD-10-CM | POA: Diagnosis not present

## 2014-04-13 DIAGNOSIS — N400 Enlarged prostate without lower urinary tract symptoms: Secondary | ICD-10-CM | POA: Diagnosis not present

## 2014-04-13 DIAGNOSIS — R339 Retention of urine, unspecified: Secondary | ICD-10-CM | POA: Diagnosis not present

## 2014-04-13 DIAGNOSIS — M069 Rheumatoid arthritis, unspecified: Secondary | ICD-10-CM | POA: Insufficient documentation

## 2014-04-13 DIAGNOSIS — K9189 Other postprocedural complications and disorders of digestive system: Secondary | ICD-10-CM | POA: Diagnosis not present

## 2014-04-13 DIAGNOSIS — Z8659 Personal history of other mental and behavioral disorders: Secondary | ICD-10-CM | POA: Insufficient documentation

## 2014-04-13 DIAGNOSIS — H9190 Unspecified hearing loss, unspecified ear: Secondary | ICD-10-CM | POA: Diagnosis not present

## 2014-04-13 DIAGNOSIS — Z87442 Personal history of urinary calculi: Secondary | ICD-10-CM | POA: Insufficient documentation

## 2014-04-13 DIAGNOSIS — Z9889 Other specified postprocedural states: Secondary | ICD-10-CM | POA: Insufficient documentation

## 2014-04-13 DIAGNOSIS — Z79899 Other long term (current) drug therapy: Secondary | ICD-10-CM | POA: Insufficient documentation

## 2014-04-13 LAB — BASIC METABOLIC PANEL
Anion gap: 13 (ref 5–15)
BUN: 22 mg/dL (ref 6–23)
CHLORIDE: 99 meq/L (ref 96–112)
CO2: 26 mEq/L (ref 19–32)
Calcium: 9.4 mg/dL (ref 8.4–10.5)
Creatinine, Ser: 1.14 mg/dL (ref 0.50–1.35)
GFR calc non Af Amer: 57 mL/min — ABNORMAL LOW (ref >90)
GFR, EST AFRICAN AMERICAN: 66 mL/min — AB (ref >90)
Glucose, Bld: 110 mg/dL — ABNORMAL HIGH (ref 70–99)
Potassium: 4.2 mEq/L (ref 3.7–5.3)
SODIUM: 138 meq/L (ref 137–147)

## 2014-04-13 LAB — CBC WITH DIFFERENTIAL/PLATELET
Basophils Absolute: 0 10*3/uL (ref 0.0–0.1)
Basophils Relative: 0 % (ref 0–1)
Eosinophils Absolute: 0.2 10*3/uL (ref 0.0–0.7)
Eosinophils Relative: 3 % (ref 0–5)
HCT: 37.3 % — ABNORMAL LOW (ref 39.0–52.0)
HEMOGLOBIN: 12 g/dL — AB (ref 13.0–17.0)
LYMPHS ABS: 1.4 10*3/uL (ref 0.7–4.0)
Lymphocytes Relative: 21 % (ref 12–46)
MCH: 28.8 pg (ref 26.0–34.0)
MCHC: 32.2 g/dL (ref 30.0–36.0)
MCV: 89.7 fL (ref 78.0–100.0)
MONOS PCT: 12 % (ref 3–12)
Monocytes Absolute: 0.8 10*3/uL (ref 0.1–1.0)
NEUTROS ABS: 4.4 10*3/uL (ref 1.7–7.7)
Neutrophils Relative %: 64 % (ref 43–77)
PLATELETS: 213 10*3/uL (ref 150–400)
RBC: 4.16 MIL/uL — AB (ref 4.22–5.81)
RDW: 13 % (ref 11.5–15.5)
WBC: 6.7 10*3/uL (ref 4.0–10.5)

## 2014-04-13 NOTE — Progress Notes (Signed)
Pt states he urinated after taking valium.  Feels better now.  No abd pain.  Does have LOA.    BP 120/59 mmHg  Pulse 61  Temp(Src) 98.6 F (37 C) (Oral)  Resp 20  SpO2 96% NAD RRR Abd: Soft Rectal: No pain, no impaction, no crepitus   A/P Will get cbc and bmet  IF wbc elevated, will get CT pelvis to r/o pelvic sepsis If wbc normal, ok to d/c and use valium as needed for urinary retention.  Change miralax to PRN.  F/u in office

## 2014-04-13 NOTE — ED Notes (Signed)
Pt had hemorrhoid surgery on Wednesday by Dr Marcello Moores  Pt went home and that night pt started running a fever, had urinary retention, and a syncopal episode  Pt was seen at [facility] that night  Pt has been doing ok until today when he started having urinary retention again, c/o chills and feeling feverish  Family called Dr Marcello Moores and she sent them here to be evaluated for a pelvic infection and IV antibiotics  Pt was able to urinate at home after being given Valium 2.5mg  by mouth  Family states pt has been having stool leakage since the surgery and is taking Miralax

## 2014-04-14 DIAGNOSIS — R339 Retention of urine, unspecified: Secondary | ICD-10-CM | POA: Diagnosis not present

## 2014-04-14 LAB — URINALYSIS, ROUTINE W REFLEX MICROSCOPIC
Bilirubin Urine: NEGATIVE
Glucose, UA: NEGATIVE mg/dL
Hgb urine dipstick: NEGATIVE
Ketones, ur: NEGATIVE mg/dL
Leukocytes, UA: NEGATIVE
Nitrite: NEGATIVE
Protein, ur: NEGATIVE mg/dL
Specific Gravity, Urine: 1.021 (ref 1.005–1.030)
Urobilinogen, UA: 1 mg/dL (ref 0.0–1.0)
pH: 6 (ref 5.0–8.0)

## 2014-04-14 NOTE — Discharge Instructions (Signed)
?????? ???????? ???? °(  Acute Urinary Retention) ?????? ???????? ???? ???????????? ????? ????????? ????????????? ? ??????????????. ??? ????? ????????????? ????????? ? ??????? ??????. ? ????????? ? ?????? ?????????????? ?????? ????????????? ? ????????? ????? ???? ?? ???????? ??????. ??? ???????, ??? ????????? ??????????? ??????????.  ?????????? ?? ????? ? ???????? ???????? ???? ??? ?????????? ????? ? ????????????? ????????? ????? ? ????????? ????????, ??? ??????? ???????? ? ??????? ?????? ????????? ???? ????????. ???? ??? ?????????? ???????, ????? ??????????? ?????????? ????????. ??????? ???????????? ?? ?????? ???? ???????? ? ??????? ?? ???, ????? ?? ??? ??????. ??? ????? ??? ????, ????? ???????????? ???? ?? ???????? ??????? ? ??????? ?????? ? ?? ??????? ? ????????????? ??????? ?????. ?????????? ??? ???????? ???? ????????? ??????. ???? ?? ??? ???????, ??????? ?????? ???????????? ? ?????? ?????. ? ???? ??????? ???????????????, ??? ????????? ????? ??? ????, ?? ????????? ?????. ?????? ??? ????? ?????????? ?????? ??????????????. ?? ????? ??????? ???????????, ??????? ??? ?????????? ?????????? ????. ?????? ??????? ???????????? ??????? ? ???, ??? ??? ????? ??????????? ?????? ? ???? ??? ???????, ??? ???????????? ??????? ???????????? ? ????????? ???????? ?? ????. ??? ?????????? ????, ????????? ? ???????? ??????????? ?????????? ?????? ??????????? ?????? ?????????????? ??? ??????????? ?????????.  ?????????? ? ?????, ????:  ? ??? ?????????? ???????????;  ? ??? ?????????? ?????? ??? ?????? ???? ??? ???????. ?????????? ?????????? ? ?????, ????:   ? ??? ??????? ????? ??? ?????????.  ????? ????????????? ? ??? ??????? ????????? ????????? ????;  ??? ??????? ????????;  ? ??? ?????????? ?????????? ??????????????, ??????? ?? ??????????? ? ????? ? ??? ?????????? ??????????? ????????. ?????????, ??? ??:  ????????? ?????? ??????????.  ?????? ?????????????? ??????? ?? ????? ??????????.  ??????????????? ?????????? ?  ?????, ???? ??? ?? ?????????? ????? ??? ?????????? ????. Document Released: 05/30/2005 Document Revised: 06/04/2013 [facility] Patient Information 2015 [place]. This information is not intended to replace advice given to you by your health care provider. Make sure you discuss any questions you have with your health care provider.

## 2014-04-23 NOTE — ED Provider Notes (Signed)
CSN: [id number]     Arrival date & time 04/13/14  1949 History   First MD Initiated Contact with Patient 04/14/14 0041     Chief Complaint  Patient presents with  . Post-op Problem     (Consider location/radiation/quality/duration/timing/severity/associated sxs/prior Treatment) HPI   80 year old male with urinary retention after recent hemorrhoid surgery. Actually able to void spontaneously again. This seems to be easier for him after he takes a small dose of Valium.Has had intermittent fevers at home. Some leakage of stool since his surgery, otherwise no acute complaints. Recently evaluated in the emergency room for similar type symptoms.no urinary complaints otherwise. No vomiting. No cough. No shortness of breath. No unusual rash. Daughter called surgeon, Dr. Marcello Moores who recommended he come to the emergency room. Patient is [place]. There is somewhat of a language barrier. He does seem to understand some English and answers simple questions. Additional history provided by daughter & review of records.  Past Medical History  Diagnosis Date  . Gilbert's syndrome   . Heart murmur   . Thyroid nodule     noted 11-2009  . Moderate aortic stenosis     cardiologist-  dr Marlou Porch  . Rotator cuff syndrome of right shoulder 08/01/2013  . Hypertension   . Depression   . Internal hemorrhoid, bleeding   . History of syncope     2002--  vasovagal  . History of kidney stones   . Diverticulosis of colon   . RA (rheumatoid arthritis)     bilateral hands/ wrist--  seronegative  . History of gastroenteritis   . Nocturia   . BPH (benign prostatic hypertrophy)   . Hearing loss   . Wears dentures   . At risk for sleep apnea     STOP-BANG= 4    SENT TO PCP 04-04-2014   Past Surgical History  Procedure Laterality Date  . Cardiovascular stress test  05/ 2011   dr Marlou Porch    normal low risk perfusion study  . Transthoracic echocardiogram  11-26-2013    moderate focal basal and mild LVH/  ef 65-70%/   grade I diastolic dysfunction/ mild LAE/  moderate calcification with stenosis AV with mild regurg,  gradients 35 abd 58 mmHg /  mild TR  . Cardiac catheterization  07-19-2010  dr Tressia Miners turner    normal coronary arteries,  ef 60%,  moderate aortic stenosis- gradient 70mmHg, normal right heart pressure  . Colonoscopy  2010  approx  . Cataract extraction w/ intraocular lens  implant, bilateral  2013  . Transanal hemorrhoidal dearterialization N/A 04/09/2014    Procedure: TRANSANAL HEMORRHOIDAL DEARTERIALIZATION OF INTERNAL HEMORROIDS;  Surgeon: Leighton Ruff, MD;  Location: [facility] [place];  Service: General;  Laterality: N/A;   Family History  Problem Relation Age of Onset  . Diabetes Brother   . Diabetes Brother   . Breast cancer Sister   . CVA Father   . Pulmonary embolism Mother 18    caused by complications of surgery   History  Substance Use Topics  . Smoking status: Former Smoker -- 40 years    Types: Cigarettes    Start date: 06/13/1973    Quit date: 04/03/1981  . Smokeless tobacco: Never Used  . Alcohol Use: Yes     Comment: occasional    Review of Systems  All systems reviewed and negative, other than as noted in HPI.   Allergies  Citrus and Eggs or egg-derived products  Home Medications   Prior to Admission medications  Medication Sig Start Date End Date Taking? Authorizing Provider  AVODART 0.5 MG capsule Take 0.5 mg by mouth daily. 06/02/13  Yes Historical Provider, MD  Cholecalciferol (VITAMIN D3) 2000 UNITS TABS Take 1 capsule by mouth daily.   Yes Historical Provider, MD  DIOVAN 80 MG tablet Take 80 mg by mouth every morning.  05/19/13  Yes Historical Provider, MD  hydrochlorothiazide (MICROZIDE) 12.5 MG capsule Take 12.5 mg by mouth daily. 06/04/13  Yes Historical Provider, MD  ibuprofen (ADVIL,MOTRIN) 800 MG tablet Take 800 mg by mouth every 8 (eight) hours as needed for moderate pain.  06/02/13  Yes Historical Provider, MD  methotrexate  (RHEUMATREX) 2.5 MG tablet Take 22.5 mg by mouth once a week. On Monday 07/13/13  Yes Historical Provider, MD  Omega-3 Fatty Acids (FISH OIL) 1000 MG CAPS Take 1 capsule by mouth daily.   Yes Historical Provider, MD  psyllium (METAMUCIL) 58.6 % packet Take 1 packet by mouth daily.   Yes Historical Provider, MD  simvastatin (ZOCOR) 20 MG tablet Take 20 mg by mouth daily.   Yes Historical Provider, MD  diazepam (VALIUM) 5 MG tablet Take 1 tablet (5 mg total) by mouth every 6 (six) hours as needed (spasm, anal or bladder). 04/10/14   Richarda Blade, MD  HYDROcodone-acetaminophen (NORCO/VICODIN) 5-325 MG per tablet Take 1 tablet by mouth every 4 (four) hours as needed. 03/29/50   Leighton Ruff, MD   BP 112/58 mmHg  Pulse 55  Temp(Src) 98.6 F (37 C) (Oral)  Resp 16  SpO2 97% Physical Exam  Constitutional: He appears well-developed and well-nourished. No distress.  HENT:  Head: Normocephalic and atraumatic.  Eyes: Conjunctivae are normal. Right eye exhibits no discharge. Left eye exhibits no discharge.  Neck: Neck supple.  Cardiovascular: Normal rate, regular rhythm and normal heart sounds.  Exam reveals no gallop and no friction rub.   No murmur heard. Pulmonary/Chest: Effort normal and breath sounds normal. No respiratory distress.  Abdominal: Soft. He exhibits no distension. There is no tenderness.  Genitourinary:  Patient deferring rectal examination. He states that Dr. Marcello Moores just examined and that "everything looks good."  Musculoskeletal: He exhibits no edema or tenderness.  Neurological: He is alert.  Skin: Skin is warm and dry.  Psychiatric: He has a normal mood and affect. His behavior is normal. Thought content normal.  Nursing note and vitals reviewed.   ED Course  Procedures (including critical care time) Labs Review Labs Reviewed  CBC WITH DIFFERENTIAL - Abnormal; Notable for the following:    RBC 4.16 (*)    Hemoglobin 12.0 (*)    HCT 37.3 (*)    All other components  within normal limits  BASIC METABOLIC PANEL - Abnormal; Notable for the following:    Glucose, Bld 110 (*)    GFR calc non Af Amer 57 (*)    GFR calc Af Amer 66 (*)    All other components within normal limits  URINALYSIS, ROUTINE W REFLEX MICROSCOPIC    Imaging Review No results found.   EKG Interpretation None      MDM   Final diagnoses:  Urinary retention    80 year old male with urinary retention after recent hemorrhoid surgery. Actually able to void spontaneously again. His abdominal exam is benign. Has had intermittent fevers at home. No clear source of this. He is afebrile emergency room. He is nontoxic.no leukocytosis. Discuss with patient/daughter possibly obtaining CT of abdomen and pelvis. They're deferring at this time. I feel that this is  reasonable. Recommended touching base with surgery If His Symptoms Continue. Return Precautions Discussed.   Virgel Manifold, MD 04/23/14 [phone]

## 2014-05-01 DIAGNOSIS — H9311 Tinnitus, right ear: Secondary | ICD-10-CM | POA: Diagnosis not present

## 2014-05-01 DIAGNOSIS — H903 Sensorineural hearing loss, bilateral: Secondary | ICD-10-CM | POA: Diagnosis not present

## 2014-05-04 ENCOUNTER — Other Ambulatory Visit: Payer: Self-pay | Admitting: Cardiology

## 2014-05-26 DIAGNOSIS — Z79899 Other long term (current) drug therapy: Secondary | ICD-10-CM | POA: Diagnosis not present

## 2014-05-26 DIAGNOSIS — M2550 Pain in unspecified joint: Secondary | ICD-10-CM | POA: Diagnosis not present

## 2014-05-26 DIAGNOSIS — M0609 Rheumatoid arthritis without rheumatoid factor, multiple sites: Secondary | ICD-10-CM | POA: Diagnosis not present

## 2014-05-27 DIAGNOSIS — M0609 Rheumatoid arthritis without rheumatoid factor, multiple sites: Secondary | ICD-10-CM | POA: Diagnosis not present

## 2014-05-30 DIAGNOSIS — I10 Essential (primary) hypertension: Secondary | ICD-10-CM | POA: Diagnosis not present

## 2014-05-30 DIAGNOSIS — E780 Pure hypercholesterolemia: Secondary | ICD-10-CM | POA: Diagnosis not present

## 2014-05-30 DIAGNOSIS — Q253 Supravalvular aortic stenosis: Secondary | ICD-10-CM | POA: Diagnosis not present

## 2014-05-30 DIAGNOSIS — Z79899 Other long term (current) drug therapy: Secondary | ICD-10-CM | POA: Diagnosis not present

## 2014-06-19 ENCOUNTER — Emergency Department (HOSPITAL_COMMUNITY)
Admission: EM | Admit: 2014-06-19 | Discharge: 2014-06-19 | Disposition: A | Discharge status: Home / Self Care | Payer: Medicare Other | Attending: Emergency Medicine | Admitting: Emergency Medicine

## 2014-06-19 ENCOUNTER — Emergency Department (HOSPITAL_COMMUNITY): Payer: Medicare Other

## 2014-06-19 ENCOUNTER — Encounter (HOSPITAL_COMMUNITY): Payer: Self-pay | Admitting: Cardiology

## 2014-06-19 ENCOUNTER — Telehealth: Payer: Self-pay | Admitting: Cardiology

## 2014-06-19 DIAGNOSIS — H9190 Unspecified hearing loss, unspecified ear: Secondary | ICD-10-CM | POA: Insufficient documentation

## 2014-06-19 DIAGNOSIS — Z8639 Personal history of other endocrine, nutritional and metabolic disease: Secondary | ICD-10-CM | POA: Diagnosis not present

## 2014-06-19 DIAGNOSIS — R011 Cardiac murmur, unspecified: Secondary | ICD-10-CM | POA: Diagnosis not present

## 2014-06-19 DIAGNOSIS — R001 Bradycardia, unspecified: Secondary | ICD-10-CM | POA: Diagnosis not present

## 2014-06-19 DIAGNOSIS — Z8739 Personal history of other diseases of the musculoskeletal system and connective tissue: Secondary | ICD-10-CM | POA: Insufficient documentation

## 2014-06-19 DIAGNOSIS — F329 Major depressive disorder, single episode, unspecified: Secondary | ICD-10-CM | POA: Diagnosis not present

## 2014-06-19 DIAGNOSIS — Z87448 Personal history of other diseases of urinary system: Secondary | ICD-10-CM | POA: Insufficient documentation

## 2014-06-19 DIAGNOSIS — Z87442 Personal history of urinary calculi: Secondary | ICD-10-CM | POA: Insufficient documentation

## 2014-06-19 DIAGNOSIS — Z87891 Personal history of nicotine dependence: Secondary | ICD-10-CM | POA: Insufficient documentation

## 2014-06-19 DIAGNOSIS — Z79899 Other long term (current) drug therapy: Secondary | ICD-10-CM | POA: Insufficient documentation

## 2014-06-19 DIAGNOSIS — Z8669 Personal history of other diseases of the nervous system and sense organs: Secondary | ICD-10-CM | POA: Insufficient documentation

## 2014-06-19 DIAGNOSIS — R0602 Shortness of breath: Secondary | ICD-10-CM | POA: Insufficient documentation

## 2014-06-19 DIAGNOSIS — Z791 Long term (current) use of non-steroidal anti-inflammatories (NSAID): Secondary | ICD-10-CM | POA: Diagnosis not present

## 2014-06-19 DIAGNOSIS — R079 Chest pain, unspecified: Secondary | ICD-10-CM

## 2014-06-19 DIAGNOSIS — R0789 Other chest pain: Secondary | ICD-10-CM | POA: Diagnosis not present

## 2014-06-19 DIAGNOSIS — Z8719 Personal history of other diseases of the digestive system: Secondary | ICD-10-CM | POA: Diagnosis not present

## 2014-06-19 DIAGNOSIS — Z9889 Other specified postprocedural states: Secondary | ICD-10-CM | POA: Diagnosis not present

## 2014-06-19 DIAGNOSIS — I10 Essential (primary) hypertension: Secondary | ICD-10-CM | POA: Insufficient documentation

## 2014-06-19 LAB — BASIC METABOLIC PANEL
Anion gap: 9 (ref 5–15)
BUN: 19 mg/dL (ref 6–23)
CO2: 27 mmol/L (ref 19–32)
Calcium: 8.9 mg/dL (ref 8.4–10.5)
Chloride: 101 mEq/L (ref 96–112)
Creatinine, Ser: 1.09 mg/dL (ref 0.50–1.35)
GFR calc Af Amer: 70 mL/min — ABNORMAL LOW (ref >90)
GFR, EST NON AFRICAN AMERICAN: 60 mL/min — AB (ref >90)
GLUCOSE: 178 mg/dL — AB (ref 70–99)
Potassium: 4.4 mmol/L (ref 3.5–5.1)
Sodium: 137 mmol/L (ref 135–145)

## 2014-06-19 LAB — BRAIN NATRIURETIC PEPTIDE: B NATRIURETIC PEPTIDE 5: 41.8 pg/mL (ref 0.0–100.0)

## 2014-06-19 LAB — TROPONIN I
Troponin I: <0.03 ng/mL (ref <0.031)
Troponin I: <0.03 ng/mL (ref <0.031)

## 2014-06-19 LAB — CBC
HCT: 41.1 % (ref 39.0–52.0)
HEMOGLOBIN: 13.5 g/dL (ref 13.0–17.0)
MCH: 29.7 pg (ref 26.0–34.0)
MCHC: 32.8 g/dL (ref 30.0–36.0)
MCV: 90.5 fL (ref 78.0–100.0)
Platelets: 185 10*3/uL (ref 150–400)
RBC: 4.54 MIL/uL (ref 4.22–5.81)
RDW: 13.5 % (ref 11.5–15.5)
WBC: 5.9 10*3/uL (ref 4.0–10.5)

## 2014-06-19 MED ORDER — ASPIRIN 81 MG PO CHEW
324.0000 mg | CHEWABLE_TABLET | Freq: Once | ORAL | Status: AC
  Administered 2014-06-19: 324 mg via ORAL
  Filled 2014-06-19: qty 4

## 2014-06-19 NOTE — ED Notes (Signed)
Pt back from x-ray.

## 2014-06-19 NOTE — ED Notes (Signed)
Patient transported to X-ray 

## 2014-06-19 NOTE — Telephone Encounter (Signed)
Pt's daughter,Dora states pt has been having chest discomfort/tightness for 2 days. Pt woke up last night SOB and was unable to sleep because he was short of breath most of the night.  [name] states but not currently in respiratory distress but is having chest discomfort/chest tightness at this time.  I reviewed with Lucillie Garfinkel and he recommended pt go to ED now. Dora advised, verbalized understanding.

## 2014-06-19 NOTE — Telephone Encounter (Signed)
Pt c/o Shortness Of Breath: STAT if SOB developed within the last 24 hours or pt is noticeably SOB on the phone  1. Are you currently SOB (can you hear that pt is SOB on the phone)? Last night considerably bad, better now, not dissappeared  2. How long have you been experiencing SOB? Couple days, last night worse tahn normal 3. Are you SOB when sitting or when up moving around? Couldn't say 4.  Are you currently experiencing any other symptoms? No, BP up, but he took medication to counter the  High BP.

## 2014-06-19 NOTE — Discharge Instructions (Signed)
Please follow up with your primary care physician in 1-2 days. If you do not have one please call the [facility] number listed above. Please follow up with Dr. Marlou Porch to schedule a follow up appointment.  Please read all discharge instructions and return precautions.    ???? ? ??????? ??????? ?????? (???????????????) (Chest Pain (Nonspecific)) ????? ??? ????? ? ????? ????? ?????? ????????? ???????????? ???????. ?????? ??????? ??????????? ????, ??? ???? ???? ????? ???? ??????? ? ???-?? ?????????, ????????, ????????? ????????? ??? ?????????? ??????? ????? ? ???????? ??????????? ?????. ??? ?????????? ?????? ????????? ?? ?????? ??????????? ????? ??????? ??????. ???????   ??????.  ????????? ??? ???????.  ??????? ??? ??????.  ?????????? ???????? ?????? (??????????) ??? ?????? (??????? , ??? ?????????? ??????).  ????? ? ??????.  ???????? ??????? (????????????). ?? ????? ????????? ????????, ??????????????? (?????????? ????????????) ??? ? ?????????? ?????? ?????.  ???????????? ????? (?????????? ??????? ????????????? ???????). ??????? ?????? ??????? ?? ??????, ???? ? ??????. ????????? ?? ??????? ????? ?? ???? ?????? ????? ???? ???????? ????.  ????? ??????? ????? ?????????? ? ?????????? ??????.  ????? ??? ???????? ????? ????? ?????????? ?????????? ??? ????? ??? ? ?????????? ?????????????.  ????? ????? ???? ???????? ??????????????? ?????????? ? ?????? ?????? (???????? ???????). ???????  ????? ????? ??????? ????? ???? ????? ????????????? ???????????? ????? ??? ?????? ????????????. ??? ??????? ???? ????? ????? ????????? ????????????, ?????????? ???????????? ???????????????????? (???). ??? ???? ??? ?????????? ???????? ???????????? ? ??????? 24-???????? ???????. ??? ????? ????? ????????? ?????? ????????????, ????????:  ???????????????? ??????????????? (TT?). ?? ????? ??????????????? ??? ?????? ????, ??? ????? ????????? ????? ??????, ???????????? ???????? ?????.  ??????????????  ??????????????? (???)  ????????????????. ???? ??? ???????????? ????????? ????? ???????? ? ??????? ??????????? ??????? ?????? ? ???????????? ? ????????? ?????.  ???????? ??????????? ??? ?? ???????. ??? ????????? ??????????? ??????????, ??????? ????????? ????????? ???????????? ? ????? ?????? ? ??????????? ???????. ?????? ?????????? ????????? ???????? ????? ??? ????????????? ??????????? ???????????? ? ??????? ?????????? ????.  ??? ??? ????????? ??? ??? ????? ???????? ?????????, ??????? ???????? ????????? ????. ???????   ??????? ??????? ?? ??????? ????? ???? ? ?????. ??????? ????? ???????? ?????????:  ????????? ????????? ??? ??????.  ????????????????????? ?????????.  ?????????????? ????????? ??? ?????????????? ????????????.  ??????????? ??? ??????? ????????.  ???????????? ? ????????? ????????? ?????? ?????. ??? ???????? ????? ?? ??????? ? ???????? ????????, ???????????? ???? ? ????????.  ??? ????? ???????????? ????? ????? ???????, ????? ??????? ????? ???????? ?????? ???? ???? ?????? (??????????? ). ??? ??????? ??????????? ????????? ??????? ?? ??????? ? ???????. ? ??????????? ??????? ??????????????? ???? ? ????? ???????? ? ??????? 2-3 ???? ????? ?????? ? ?????? ?????? ?????????????? ??????????.  ?????????? ?? ????? ? ???????? ????????   ???? ??? ????????? ???????????, ?????????? ?? ? ???????????? ? ????????. ?? ?????? ????????? ??????????? ???? ????????????, ???? ???? ?????????? ???? ?????.  ? ????????? ????????? ???? ????????? ?????????? ???????? ??? ????????????, ????? ??????? ???? ? ????? ??????????????. ????????????? ?????????? ???????? ? ???????????? ? ?????????? [?????].  ?? ??????? ??????????? ????? ???????? ??????? ? ??? ????? ????????, ??????????? ????? ??? ??????????? ????????.  ?? ???????????? ???????? ???????.  ?????????? ????????????? ????????? ?????? ? ???????????? ? ?????????? ?????.  ???? ???? ? ????? ?? ????????, ??????? ??????????? ?????? ????? ?? ???????????  ????????????.  ?? ??????????? ??????????? ?????? ? ?????. ???? ?? ?? ?????? ????????? ?? ?????? ? ??????, ???? ? ??? ????? ??????? ? ??????????????? (???????????) ??????. ???? ???????? ?????-???? ????????, ????????? ?????, ????? ????????? ???????. ?????????? ? ?????, ????:   ???? ???? ? ????? ?? ????????, ???? ????? ???????.  ?? ???? ? ??????? ????? ????????? ???? ? ????????.  ? ??? ?????????? ???????????. ?????????? ?????????? ? ?????, ????:   ? ??? ??????????? ???? ? ??????? ??????, ??? ????????? ????, ???????? ? ????, ???, ???????, ????? ??? ?????.  ? ??? ????????? ??????.  ? ??? ???????? ??????, ??? ?? ???????????? ?????.  ? ??? ????????? ??????? ???? ? ????? ??? ??????.  ??? ???????????? ??? ?????????? ?????.  ? ??? ?????? ??????? ????????.  ?? ??????? ????????.  ? ??? ??????? ?????. ??? ?????? ??????? ????????. ?? ??????? ?????, ??? ??? ?????? ?????. ?????????? ????????? ?????? ??????????? ??????. ?????????? (? ??? ???????:  911) ????????? ? ????????? ????????? ?????? ??????. ?? ??????? ?????????????? ???????? ??????? ?? ????????. ?????????, ??? ??:   ????????? ?????? ??????????.  ?????? ?????????????? ??????? ?? ????? ??????????.  ??????????????? ?????????? ? ?????, ???? ??? ?? ?????????? ????? ??? ?????????? ????. Document Released: 05/12/2008 Document Revised: 06/04/2013 [facility] Patient Information 2015 [place]. This information is not intended to replace advice given to you by your health care provider. Make sure you discuss any questions you have with your health care provider.

## 2014-06-19 NOTE — ED Notes (Signed)
Pt reports for the past couple of days he has had chest pain and felt SOB. Denies any n/v. Reports a cardiac hx.

## 2014-06-19 NOTE — ED Provider Notes (Signed)
CSN: [id number]     Arrival date & time 06/19/14  7209 History   First MD Initiated Contact with Patient 06/19/14 [phone]     Chief Complaint  Patient presents with  . Chest Pain     (Consider location/radiation/quality/duration/timing/severity/associated sxs/prior Treatment) HPI Comments: Patient is an 81 year old male past medical history significant for aortic stenosis, hypertension presenting to the emergency department for to 3 day history of chest pressure with shortness of breath. He states his last episode of chest pain was 16 hours in length. He is currently symptom-free. He denies any associated diaphoresis, nausea, vomiting, leg swelling, abdominal or back pain with his chest pain. He denies a history of similar chest pain. He is followed by Dr. Marlou Porch of cardiology, last visit was 6 months ago at which time he had an echocardiogram without significant change from baseline. He had a cardiac catheterization done in 2012 without stent placement, some vessel disease noted.  The history is provided by the patient and a relative. The history is limited by a language barrier. A language interpreter was used.    Past Medical History  Diagnosis Date  . Gilbert's syndrome   . Heart murmur   . Thyroid nodule     noted 11-2009  . Moderate aortic stenosis     cardiologist-  dr Marlou Porch  . Rotator cuff syndrome of right shoulder 08/01/2013  . Hypertension   . Depression   . Internal hemorrhoid, bleeding   . History of syncope     2002--  vasovagal  . History of kidney stones   . Diverticulosis of colon   . RA (rheumatoid arthritis)     bilateral hands/ wrist--  seronegative  . History of gastroenteritis   . Nocturia   . BPH (benign prostatic hypertrophy)   . Hearing loss   . Wears dentures   . At risk for sleep apnea     STOP-BANG= 4    SENT TO PCP 04-04-2014   Past Surgical History  Procedure Laterality Date  . Cardiovascular stress test  05/ 2011   dr Marlou Porch    normal low risk  perfusion study  . Transthoracic echocardiogram  11-26-2013    moderate focal basal and mild LVH/  ef 62-83%/  grade I diastolic dysfunction/ mild LAE/  moderate calcification with stenosis AV with mild regurg,  gradients 35 abd 58 mmHg /  mild TR  . Cardiac catheterization  07-19-2010  dr Tressia Miners turner    normal coronary arteries,  ef 60%,  moderate aortic stenosis- gradient 69mmHg, normal right heart pressure  . Colonoscopy  2010  approx  . Cataract extraction w/ intraocular lens  implant, bilateral  2013  . Transanal hemorrhoidal dearterialization N/A 04/09/2014    Procedure: TRANSANAL HEMORRHOIDAL DEARTERIALIZATION OF INTERNAL HEMORROIDS;  Surgeon: Leighton Ruff, MD;  Location: [facility];  Service: General;  Laterality: N/A;   Family History  Problem Relation Age of Onset  . Diabetes Brother   . Diabetes Brother   . Breast cancer Sister   . CVA Father   . Pulmonary embolism Mother 36    caused by complications of surgery   History  Substance Use Topics  . Smoking status: Former Smoker -- 40 years    Types: Cigarettes    Start date: 06/13/1973    Quit date: 04/03/1981  . Smokeless tobacco: Never Used  . Alcohol Use: Yes     Comment: occasional    Review of Systems  Respiratory: Positive for shortness of breath.  Cardiovascular: Positive for chest pain. Negative for palpitations and leg swelling.  All other systems reviewed and are negative.     Allergies  Citrus and Eggs or egg-derived products  Home Medications   Prior to Admission medications   Medication Sig Start Date End Date Taking? Authorizing Provider  AVODART 0.5 MG capsule Take 0.5 mg by mouth daily. 06/02/13   Historical Provider, MD  Cholecalciferol (VITAMIN D3) 2000 UNITS TABS Take 1 capsule by mouth daily.    Historical Provider, MD  diazepam (VALIUM) 5 MG tablet Take 1 tablet (5 mg total) by mouth every 6 (six) hours as needed (spasm, anal or bladder). 04/10/14   Richarda Blade, MD   DIOVAN 80 MG tablet Take 80 mg by mouth every morning.  05/19/13   Historical Provider, MD  hydrochlorothiazide (MICROZIDE) 12.5 MG capsule Take 12.5 mg by mouth daily. 06/04/13   Historical Provider, MD  HYDROcodone-acetaminophen (NORCO/VICODIN) 5-325 MG per tablet Take 1 tablet by mouth every 4 (four) hours as needed. 22/02/54   Leighton Ruff, MD  ibuprofen (ADVIL,MOTRIN) 800 MG tablet Take 800 mg by mouth every 8 (eight) hours as needed for moderate pain.  06/02/13   Historical Provider, MD  methotrexate (RHEUMATREX) 2.5 MG tablet Take 22.5 mg by mouth once a week. On Monday 07/13/13   Historical Provider, MD  Omega-3 Fatty Acids (FISH OIL) 1000 MG CAPS Take 1 capsule by mouth daily.    Historical Provider, MD  psyllium (METAMUCIL) 58.6 % packet Take 1 packet by mouth daily.    Historical Provider, MD  simvastatin (ZOCOR) 20 MG tablet Take 20 mg by mouth daily.    Historical Provider, MD  simvastatin (ZOCOR) 20 MG tablet TAKE 1 TABLET BY MOUTH EVERY NIGHT AT BEDTIME 05/06/14   Candee Furbish, MD   BP 131/63 mmHg  Pulse 54  Temp(Src) 98.4 F (36.9 C) (Oral)  Resp 19  SpO2 96% Physical Exam  Constitutional: He is oriented to person, place, and time. He appears well-developed and well-nourished. No distress.  HENT:  Head: Normocephalic and atraumatic.  Right Ear: External ear normal.  Left Ear: External ear normal.  Nose: Nose normal.  Eyes: Conjunctivae are normal.  Neck: Neck supple.  Cardiovascular: Normal rate and regular rhythm.   Murmur heard.  Systolic murmur is present  Pulmonary/Chest: Effort normal and breath sounds normal. No respiratory distress.  Abdominal: Soft. There is no tenderness.  Musculoskeletal: Normal range of motion. He exhibits no edema.  Neurological: He is alert and oriented to person, place, and time. Gait normal.  Skin: Skin is warm and dry. He is not diaphoretic.  Nursing note and vitals reviewed.   ED Course  Procedures (including critical care  time) Medications  aspirin chewable tablet 324 mg (324 mg Oral Given 06/19/14 1011)    Labs Review Labs Reviewed  BASIC METABOLIC PANEL - Abnormal; Notable for the following:    Glucose, Bld 178 (*)    GFR calc non Af Amer 60 (*)    GFR calc Af Amer 70 (*)    All other components within normal limits  CBC  BRAIN NATRIURETIC PEPTIDE  TROPONIN I  TROPONIN I    Imaging Review Dg Chest 2 View  06/19/2014   CLINICAL DATA:  81 year old male with chest pain and shortness of breath for 2 days. Initial encounter.  EXAM: CHEST  2 VIEW  COMPARISON:  01/15/2013 and earlier.  FINDINGS: Chronic increased interstitial markings in both lungs. Stable lung volumes at the upper limits  of normal. Normal cardiac size and mediastinal contours. Visualized tracheal air column is within normal limits. No pneumothorax, pulmonary edema, pleural effusion or confluent pulmonary opacity. Flowing osteophytes in the spine. No acute osseous abnormality identified.  IMPRESSION: No acute cardiopulmonary abnormality.   Electronically Signed   By: Lars Pinks M.D.   On: 06/19/2014 10:08     EKG Interpretation   Date/Time:  Thursday June 19 2014 09:42:00 EST Ventricular Rate:  59 PR Interval:  162 QRS Duration: 90 QT Interval:  400 QTC Calculation: 396 R Axis:   16 Text Interpretation:  Sinus bradycardia Otherwise normal ECG No  significant change since last tracing Confirmed by HARRISON  MD, FORREST  ([id number]) on 06/19/2014 10:08:40 AM      MDM   Final diagnoses:  Chest pain, atypical    Filed Vitals:   06/19/14 1330  BP: 131/63  Pulse: 54  Temp:   Resp: 19   Afebrile, NAD, non-toxic appearing, AAOx4. Afebrile, NAD, non-toxic appearing, AAOx4. Patient is to be discharged with recommendation to follow up with cardiologist in regards to today's hospital visit. Chest pain is not likely of cardiac or pulmonary etiology d/t presentation, no hypoxia tachycardia or tachypnea to suggest PE, VSS, no tracheal  deviation, no JVD or new murmur, RRR, breath sounds equal bilaterally, EKG without acute abnormalities, negative delta troponin, and negative CXR. Pt has been advised to follow up with cardiology and return to the ED is CP becomes exertional, associated with diaphoresis or nausea, radiates to left jaw/arm, worsens or becomes concerning in any way. Pt appears reliable for follow up and is agreeable to discharge.   Case has been discussed with and seen by Dr. Aline Brochure who agrees with the above plan to discharge.     Harlow Mares, PA-C 06/19/14 1451  Pamella Pert, MD 06/19/14 [id number]

## 2014-06-20 ENCOUNTER — Ambulatory Visit (INDEPENDENT_AMBULATORY_CARE_PROVIDER_SITE_OTHER): Payer: Medicare Other | Admitting: Physician Assistant

## 2014-06-20 ENCOUNTER — Encounter: Payer: Self-pay | Admitting: Physician Assistant

## 2014-06-20 ENCOUNTER — Telehealth: Payer: Self-pay | Admitting: Cardiology

## 2014-06-20 VITALS — BP 118/56 | HR 53 | Ht 66.0 in | Wt 171.0 lb

## 2014-06-20 DIAGNOSIS — F325 Major depressive disorder, single episode, in full remission: Secondary | ICD-10-CM | POA: Diagnosis not present

## 2014-06-20 DIAGNOSIS — R739 Hyperglycemia, unspecified: Secondary | ICD-10-CM | POA: Diagnosis not present

## 2014-06-20 DIAGNOSIS — G4700 Insomnia, unspecified: Secondary | ICD-10-CM | POA: Diagnosis not present

## 2014-06-20 DIAGNOSIS — I10 Essential (primary) hypertension: Secondary | ICD-10-CM | POA: Diagnosis not present

## 2014-06-20 DIAGNOSIS — R001 Bradycardia, unspecified: Secondary | ICD-10-CM | POA: Diagnosis not present

## 2014-06-20 DIAGNOSIS — I350 Nonrheumatic aortic (valve) stenosis: Secondary | ICD-10-CM

## 2014-06-20 DIAGNOSIS — R0789 Other chest pain: Secondary | ICD-10-CM

## 2014-06-20 MED ORDER — OMEPRAZOLE 20 MG PO CPDR: 20.0000 mg | DELAYED_RELEASE_CAPSULE | Freq: Every day | ORAL | Status: DC

## 2014-06-20 NOTE — Telephone Encounter (Signed)
Pt c/o Shortness Of Breath: STAT if SOB developed within the last 24 hours or pt is noticeably SOB on the phone  1. Are you currently SOB (can you hear that pt is SOB on the phone)? Not experiencing it currently 2. How long have you been experiencing SOB? Since Monday 3. Are you SOB when sitting or when up moving around? Both 4. Are you currently experiencing any other symptoms? Chest Tightness  Comments: Pt daughter called reports the pt is experiencing SOB and Chest tightness// Requests same day appt// please call

## 2014-06-20 NOTE — Progress Notes (Signed)
[facility], [place], [place]  [address] Phone: [phone] Fax:  [phone]  Date:  06/20/2014   Patient ID:  [name], DOB [birth year], MRN [id number]   PCP:  [name], MD  Cardiologist:  Marlou Porch  History of Present Illness: [name] is a 81 y.o. male originally from [place] with history of HTN, RA on methotrexate, depression, moderate AS by echo 11/2013, noncardiac CP 07/2010 with normal cors at that time (possibly r/t anxiety) who presents to clinic for evaluation of CP and dyspnea. He is here with his daughter whom he wants to translate for him since he speaks minimal [place].  He was seen in the ER yesterday. On the evening of 06/18/14 he noticed onset of substernal chest tightness and dyspnea. He was noticed he was SOB throughout the night while lying down. This got better with sitting up. He eventually was able to fall asleep. When he woke up, the chest tightness and dyspnea were still there and persisted for several hours. He called our office and was referred to the ED. Despite several hours of symptoms, workup was benign with negative troponin x2, normal BNP, normal CBC and normal BMET except glucose was up at 178. CXR showed no acute cardiopulmonary findings and EKG was unremarkable. He was discharged home and asked to f/u in the office today. He notes a residual mild chest tightness. It is not worse with palpation, inspiration, meals or exertion. Prior to the other day he had not had any recent chest pain. He used to walk 2 miles per day but more recently has noticed he has to stop about halfway through due to some dyspnea. He is not SOB today. He takes ibuprofen 800mg  daily for his RA along with methotrexate. He denies any syncope, palpitations, nausea, vomiting, BRBPR, melena, hematemesis, fever, weight changes. He otherwise feels well today.  His daughter is concerned because he tends to have symptoms when he doesn't sleep well. He's struggled with  insomnia lately and melatonin hasn't helped. She reports that when he doesn't sleep at night he ends up napping during the day and it perpetuates the cycle. She says his PCP did not think sleep apnea would be significant enough to treat in his age. No recent travel, surgery, bedrest.  Recent Labs: 12/05/2013: ALT 25; HDL Cholesterol by NMR 51.80; LDL (calc) 90 06/19/2014: B Natriuretic Peptide 41.8; Creatinine 1.09; Hemoglobin 13.5; Potassium 4.4  Wt Readings from Last 3 Encounters:  06/20/14 171 lb (77.565 kg)  04/10/14 167 lb (75.751 kg)  04/09/14 167 lb 8 oz (75.978 kg)     Past Medical History  Diagnosis Date  . Gilbert's syndrome   . Thyroid nodule     noted 11-2009  . Moderate aortic stenosis     a. Last echo 10/2013.  Marland Kitchen Rotator cuff syndrome of right shoulder 08/01/2013  . Hypertension   . Depression   . Internal hemorrhoid, bleeding   . History of syncope     2002--  vasovagal  . History of kidney stones   . Diverticulosis of colon   . RA (rheumatoid arthritis)     bilateral hands/ wrist--  seronegative  . History of gastroenteritis   . Nocturia   . BPH (benign prostatic hypertrophy)   . Hearing loss   . Wears dentures   . At risk for sleep apnea     STOP-BANG= 4    SENT TO PCP 04-04-2014  . Normal coronary arteries     a.  Cath 07/2010: normal coronaries. Felt to have noncardiac CP/SOB at that time possibly r/t anxiety.    Current Outpatient Prescriptions  Medication Sig Dispense Refill  . AVODART 0.5 MG capsule Take 0.5 mg by mouth daily.    . Cholecalciferol (VITAMIN D3) 2000 UNITS TABS Take 1 capsule by mouth daily.    Marland Kitchen DIOVAN 80 MG tablet Take 80 mg by mouth every morning.     . hydrochlorothiazide (MICROZIDE) 12.5 MG capsule Take 12.5 mg by mouth daily.    Marland Kitchen ibuprofen (ADVIL,MOTRIN) 800 MG tablet Take 800 mg by mouth every 8 (eight) hours as needed for moderate pain.     . methotrexate (RHEUMATREX) 2.5 MG tablet Take 22.5 mg by mouth once a week. On Monday    .  Omega-3 Fatty Acids (FISH OIL) 1000 MG CAPS Take 1 capsule by mouth daily.    . psyllium (METAMUCIL) 58.6 % packet Take 1 packet by mouth daily.    . simvastatin (ZOCOR) 20 MG tablet TAKE 1 TABLET BY MOUTH EVERY NIGHT AT BEDTIME 30 tablet 0   No current facility-administered medications for this visit.    Allergies:   Citrus and Eggs or egg-derived products   Social History:  The patient  reports that he quit smoking about 33 years ago. His smoking use included Cigarettes. He started smoking about 41 years ago. He smoked 0.00 packs per day for 40 years. He has never used smokeless tobacco. He reports that he drinks alcohol. He reports that he does not use illicit drugs.   Family History:  The patient's family history includes Breast cancer in his sister; CVA in his father; Diabetes in his brother and brother; Pulmonary embolism (age of onset: 67) in his mother.   ROS:  Please see the history of present illness.    All other systems reviewed and negative.   PHYSICAL EXAM:  VS:  BP 118/56 mmHg  Pulse 53  Ht 5\' 6"  (1.676 m)  Wt 171 lb (77.565 kg)  BMI 27.61 kg/m2  SpO2 98% Well nourished, well developed WM, in no acute distress HEENT: normal Neck: no JVD Cardiac:  RRR; 2/6 SEM, S2 difficult to hear Lungs:  clear to auscultation bilaterally, no wheezing, rhonchi or rales Abd: soft, nontender, no hepatomegaly Ext: no edema Skin: warm and dry Neuro:  moves all extremities spontaneously, no focal abnormalities noted  EKG:  Sinus bradycardia 53bpm otherwise no acute changes     ASSESSMENT AND PLAN:  1. Chest pain/dyspnea with history of normal cors 2012 - chest tightness is atypical for ischemia. Despite several hours of symptoms his troponins were negative and there was no arrhythmia on EKG. BNP was normal. He is not tachycardic, tachypnic or hypoxic (and no recent VTE risk factors) thus PE felt much less likely as well. Will start with 2D echocardiogram to see if his AS has progressed.  We briefly discussed stress testing. In light of normal coronaries in 2012 and plan for echocardiogram, he and his daughter would like to hold off for now and wait to see what echo shows. This is reasonable. Return to ER precautions reviewed. GI etiology is also in the differential with chronic ibuprofen/methotrexate use thus I recommended he start omeprazole 20mg  daily. 2. Moderate aortic stenosis by echo 11/2014 - recheck echo. 3. Insomnia - with his history of being "at risk" for sleep apnea, benzos are a poor choice. He lives alone so I did not feel comfortable prescribing standing Ambien especially in light of his age. I  asked him to discuss strategies for sleep management with PCP. He needs to break the cycle of daytime naps.  4. Essential HTN - controlled. 5. Sinus bradycardia - this is chronic for him. If above workup unrevealing might consider ETT to assess chronotropic competence for his exertional dyspnea.  Dispo: F/u 1 month with Dr. Marlou Porch or APP. I also asked him to f/u PCP for hyperglycemia in the ED recently.  Signed, Melina Copa, PA-C  06/20/2014 1:15 PM

## 2014-06-20 NOTE — Patient Instructions (Signed)
Your physician has recommended you make the following change in your medication:   START TAKING [facility] physician has requested that you have an echocardiogram. Echocardiography is a painless test that uses sound waves to create images of your heart. It provides your doctor with information about the size and shape of your heart and how well your heart's chambers and valves are working. This procedure takes approximately one hour. There are no restrictions for this procedure.    Your physician recommends that you schedule a follow-up appointment in:  WITH DR SKAINS OR NEXT AVAILABLE APP WHEN HE IS HERE IN 4 TO 6 WEEKS    FOLLOW UP WITH PCP DR FOR SLEEP MANAGEMENT ALSO  SUGAR LEVELS MANAGEMENT NEEDS TO BE MONITORED

## 2014-06-20 NOTE — Telephone Encounter (Signed)
Daughter [name]) called stating he was seen in ER yest for SOB, tightness in chest that he has had for a couple of days.  The ER physician advised them to call cardiology office.  She states he is still having the tightness in chest, no SOB at this time.  No wt gain, no edema in ankles. Per ER note his CXR and EKG were normal. Spoke w/ Dr. Marlou Porch who suggests that he be seen by flex today.  Spoke w/Danya Dunn,PA who will add to her schedule today at 11:30. Notified daughter of the time.  She will bring him with his medications.

## 2014-06-23 DIAGNOSIS — R739 Hyperglycemia, unspecified: Secondary | ICD-10-CM | POA: Diagnosis not present

## 2014-06-30 ENCOUNTER — Ambulatory Visit (HOSPITAL_COMMUNITY): Payer: Medicare Other | Attending: Internal Medicine | Admitting: Cardiology

## 2014-06-30 DIAGNOSIS — R0789 Other chest pain: Secondary | ICD-10-CM | POA: Diagnosis not present

## 2014-06-30 DIAGNOSIS — I350 Nonrheumatic aortic (valve) stenosis: Secondary | ICD-10-CM

## 2014-06-30 DIAGNOSIS — R079 Chest pain, unspecified: Secondary | ICD-10-CM | POA: Diagnosis not present

## 2014-06-30 NOTE — Progress Notes (Signed)
Echo performed. 

## 2014-07-01 ENCOUNTER — Telehealth: Payer: Self-pay | Admitting: *Deleted

## 2014-07-01 NOTE — Telephone Encounter (Signed)
s/w pt's daughter due to language barrier. Daughter aware of echo results with verbal understanding and was advised to have pt keep f/u 07/2014 with Dr. Marlou Porch, daughter said ok and thank you.

## 2014-07-17 ENCOUNTER — Telehealth: Payer: Self-pay | Admitting: Cardiology

## 2014-07-17 NOTE — Telephone Encounter (Signed)
Daughter is concerned that she will not be here at the appt for her father tomorrow and she is concerned that he will not be able to understand.  Assured daughter that an interpretor has been scheduled to be here for the appt.

## 2014-07-17 NOTE — Telephone Encounter (Signed)
New message      Daughter usually comes with her father---he has an appt tomorrow at 1:45.  She cannot come to the appt.  Will it be possible to call her when pt gets in the exam room and put her on speaker phone so that she can be involved in the exam?

## 2014-07-18 ENCOUNTER — Encounter: Payer: Self-pay | Admitting: Cardiology

## 2014-07-18 ENCOUNTER — Ambulatory Visit (INDEPENDENT_AMBULATORY_CARE_PROVIDER_SITE_OTHER): Payer: Medicare Other | Admitting: Cardiology

## 2014-07-18 VITALS — BP 142/78 | HR 59 | Ht 66.0 in | Wt 171.0 lb

## 2014-07-18 DIAGNOSIS — R0789 Other chest pain: Secondary | ICD-10-CM

## 2014-07-18 DIAGNOSIS — R001 Bradycardia, unspecified: Secondary | ICD-10-CM

## 2014-07-18 DIAGNOSIS — I350 Nonrheumatic aortic (valve) stenosis: Secondary | ICD-10-CM

## 2014-07-18 NOTE — Patient Instructions (Addendum)
The current medical regimen is effective;  continue present plan and medications.  Follow up in 6 months with Dr. Skains.  You will receive a letter in the mail 2 months before you are due.  Please call us when you receive this letter to schedule your follow up appointment.  Thank you for choosing [facility]!!     

## 2014-07-18 NOTE — Progress Notes (Signed)
[facility], [address] [place], [place]  [address] Phone: [phone] Fax:  [phone]  Date:  07/18/2014   Patient ID:  [name], DOB [birth year], MRN [id number]   PCP:  [name], MD  Cardiologist:  Marlou Porch  History of Present Illness: [name] is a 81 y.o. male originally from [place] with history of HTN, RA on methotrexate, depression, moderate AS by echo 06/30/14, noncardiac CP 07/2010 with normal cors at that time (possibly r/t anxiety) who presents to clinic for evaluation of CP and dyspnea. He is here with an interpreter.  He was seen recently in the ER. On the evening of 06/18/14 he noticed onset of substernal chest tightness and dyspnea. He was noticed he was SOB throughout the night while lying down. This got better with sitting up. He eventually was able to fall asleep. When he woke up, the chest tightness and dyspnea were still there and persisted for several hours. He called our office and was referred to the ED. Despite several hours of symptoms, workup was benign with negative troponin x2, normal BNP, normal CBC and normal BMET except glucose was up at 178. CXR showed no acute cardiopulmonary findings and EKG was unremarkable. He was discharged home and asked to f/u in the office today. He notes a residual mild chest tightness. It is not worse with palpation, inspiration, meals or exertion. Prior to the other day he had not had any recent chest pain. He used to walk 2 miles per day but more recently has noticed he has to stop about halfway through due to some dyspnea. He is not SOB today. He takes ibuprofen 800mg  daily for his RA along with methotrexate. He denies any syncope, palpitations, nausea, vomiting, BRBPR, melena, hematemesis, fever, weight changes.  He still states that he is having some of this discomfort but thankfully, echocardiogram was reassuring. His moderate aortic stenosis is unchanged.  Recent Labs: 12/05/2013: ALT 25; HDL Cholesterol by NMR  51.80; LDL (calc) 90 06/19/2014: B Natriuretic Peptide 41.8; Creatinine 1.09; Hemoglobin 13.5; Potassium 4.4  Wt Readings from Last 3 Encounters:  07/18/14 171 lb (77.565 kg)  06/20/14 171 lb (77.565 kg)  04/10/14 167 lb (75.751 kg)     Past Medical History  Diagnosis Date  . Gilbert's syndrome   . Thyroid nodule     noted 11-2009  . Moderate aortic stenosis     a. Last echo 11/2013.  Marland Kitchen Rotator cuff syndrome of right shoulder 08/01/2013  . Hypertension   . Depression   . Internal hemorrhoid, bleeding   . History of syncope     2002--  vasovagal  . History of kidney stones   . Diverticulosis of colon   . RA (rheumatoid arthritis)     bilateral hands/ wrist--  seronegative  . History of gastroenteritis   . Nocturia   . BPH (benign prostatic hypertrophy)   . Hearing loss   . Wears dentures   . At risk for sleep apnea     STOP-BANG= 4    SENT TO PCP 04-04-2014  . Normal coronary arteries     a. Cath 07/2010: normal coronaries. Felt to have noncardiac CP/SOB at that time possibly r/t anxiety.    Current Outpatient Prescriptions  Medication Sig Dispense Refill  . AVODART 0.5 MG capsule Take 0.5 mg by mouth daily.    . Cholecalciferol (VITAMIN D3) 2000 UNITS TABS Take 1 capsule by mouth daily.    Marland Kitchen DIOVAN 80 MG tablet Take 80 mg  by mouth every morning.     . hydrochlorothiazide (MICROZIDE) 12.5 MG capsule Take 12.5 mg by mouth daily.    Marland Kitchen ibuprofen (ADVIL,MOTRIN) 800 MG tablet Take 800 mg by mouth every 8 (eight) hours as needed for moderate pain.     . methotrexate (RHEUMATREX) 2.5 MG tablet Take 22.5 mg by mouth once a week. On Monday    . Omega-3 Fatty Acids (FISH OIL) 1000 MG CAPS Take 1 capsule by mouth daily.    . psyllium (METAMUCIL) 58.6 % packet Take 1 packet by mouth daily.    . simvastatin (ZOCOR) 20 MG tablet TAKE 1 TABLET BY MOUTH EVERY NIGHT AT BEDTIME 30 tablet 0   No current facility-administered medications for this visit.    Allergies:   Citrus and Eggs or  egg-derived products   Social History:  The patient  reports that he quit smoking about 33 years ago. His smoking use included Cigarettes. He started smoking about 41 years ago. He quit after 40 years of use. He has never used smokeless tobacco. He reports that he drinks alcohol. He reports that he does not use illicit drugs.   Family History:  The patient's family history includes Breast cancer in his sister; CVA in his father; Diabetes in his brother and brother; Pulmonary embolism (age of onset: 24) in his mother.   ROS:  Please see the history of present illness.    All other systems reviewed and negative.   PHYSICAL EXAM:  VS:  BP 142/78 mmHg  Pulse 59  Ht 5\' 6"  (1.676 m)  Wt 171 lb (77.565 kg)  BMI 27.61 kg/m2 Well nourished, well developed WM, in no acute distress HEENT: normal Neck: no JVD Cardiac:  RRR; 3/6 SEM, S2 difficult to hear Lungs:  clear to auscultation bilaterally, no wheezing, rhonchi or rales Abd: soft, nontender, no hepatomegaly Ext: no edema Skin: warm and dry Neuro:  moves all extremities spontaneously, no focal abnormalities noted  EKG:  Sinus bradycardia 53bpm otherwise no acute changes     ASSESSMENT AND PLAN:  1. Chest pain/dyspnea with history of normal cors 2012 - chest tightness is atypical for ischemia. Despite several hours of symptoms his troponins were negative and there was no arrhythmia on EKG. BNP was normal. He is not tachycardic, tachypnic or hypoxic (and no recent VTE risk factors) thus PE felt much less likely as well. Will start with 2D echocardiogram to see if his AS has progressed. We briefly discussed stress testing. In light of normal coronaries in 2012 and plan for echocardiogram, he and his daughter would like to hold off for now and wait to see what echo shows. This is reasonable. Return to ER precautions reviewed. GI etiology is also in the differential with chronic ibuprofen/methotrexate use thus I recommended he start omeprazole 20mg   daily. 2. Moderate aortic stenosis - 06/30/14-Left ventricle: The cavity size was normal. Systolic function was vigorous. The estimated ejection fraction was in the range of 65% to 70%. Doppler parameters are consistent with abnormal left ventricular relaxation (grade 1 diastolic dysfunction). - Aortic valve: AV is thickned, calcified with restricted motion Peak and mean gradients through the valve are 63 and 37 mm Hg respectively consistent with moderate AS. There was mild regurgitation. - Pulmonary arteries: PA peak pressure: 31 mm Hg (S). 3. Essential HTN - controlled. 4. Sinus bradycardia - this is chronic for him. If above workup unrevealing might consider ETT to assess chronotropic competence for his exertional dyspnea. 5. We will have a  six-month follow-up. In one year, we will repeat echocardiogram. At some point soon he may develop severe aortic stenosis at which time surgical replacement may need to be required.  Dispo: [name], MD   07/18/2014 2:04 PM

## 2014-08-25 DIAGNOSIS — M0609 Rheumatoid arthritis without rheumatoid factor, multiple sites: Secondary | ICD-10-CM | POA: Diagnosis not present

## 2014-08-25 DIAGNOSIS — Z79899 Other long term (current) drug therapy: Secondary | ICD-10-CM | POA: Diagnosis not present

## 2014-08-25 DIAGNOSIS — M2550 Pain in unspecified joint: Secondary | ICD-10-CM | POA: Diagnosis not present

## 2014-09-18 DIAGNOSIS — H43812 Vitreous degeneration, left eye: Secondary | ICD-10-CM | POA: Diagnosis not present

## 2014-10-24 DIAGNOSIS — H43812 Vitreous degeneration, left eye: Secondary | ICD-10-CM | POA: Diagnosis not present

## 2014-11-15 DIAGNOSIS — S60221A Contusion of right hand, initial encounter: Secondary | ICD-10-CM | POA: Diagnosis not present

## 2014-11-15 DIAGNOSIS — W19XXXA Unspecified fall, initial encounter: Secondary | ICD-10-CM | POA: Diagnosis not present

## 2014-12-02 DIAGNOSIS — M2550 Pain in unspecified joint: Secondary | ICD-10-CM | POA: Diagnosis not present

## 2014-12-02 DIAGNOSIS — Z79899 Other long term (current) drug therapy: Secondary | ICD-10-CM | POA: Diagnosis not present

## 2014-12-02 DIAGNOSIS — M0609 Rheumatoid arthritis without rheumatoid factor, multiple sites: Secondary | ICD-10-CM | POA: Diagnosis not present

## 2014-12-11 ENCOUNTER — Other Ambulatory Visit: Payer: Self-pay | Admitting: Geriatric Medicine

## 2014-12-11 DIAGNOSIS — E041 Nontoxic single thyroid nodule: Secondary | ICD-10-CM

## 2014-12-11 DIAGNOSIS — Q253 Supravalvular aortic stenosis: Secondary | ICD-10-CM | POA: Diagnosis not present

## 2014-12-11 DIAGNOSIS — Z0000 Encounter for general adult medical examination without abnormal findings: Secondary | ICD-10-CM | POA: Diagnosis not present

## 2014-12-11 DIAGNOSIS — I10 Essential (primary) hypertension: Secondary | ICD-10-CM | POA: Diagnosis not present

## 2014-12-11 DIAGNOSIS — E780 Pure hypercholesterolemia: Secondary | ICD-10-CM | POA: Diagnosis not present

## 2014-12-11 DIAGNOSIS — R7309 Other abnormal glucose: Secondary | ICD-10-CM | POA: Diagnosis not present

## 2014-12-11 DIAGNOSIS — F325 Major depressive disorder, single episode, in full remission: Secondary | ICD-10-CM | POA: Diagnosis not present

## 2014-12-11 DIAGNOSIS — Z79899 Other long term (current) drug therapy: Secondary | ICD-10-CM | POA: Diagnosis not present

## 2014-12-11 DIAGNOSIS — Z1389 Encounter for screening for other disorder: Secondary | ICD-10-CM | POA: Diagnosis not present

## 2014-12-11 DIAGNOSIS — M069 Rheumatoid arthritis, unspecified: Secondary | ICD-10-CM | POA: Diagnosis not present

## 2014-12-17 ENCOUNTER — Ambulatory Visit
Admission: RE | Admit: 2014-12-17 | Discharge: 2014-12-17 | Disposition: A | Discharge status: Home / Self Care | Payer: Medicaid Other | Source: Ambulatory Visit | Attending: Geriatric Medicine | Admitting: Geriatric Medicine

## 2014-12-17 ENCOUNTER — Other Ambulatory Visit: Payer: Medicare Other

## 2014-12-17 DIAGNOSIS — E041 Nontoxic single thyroid nodule: Secondary | ICD-10-CM

## 2014-12-17 DIAGNOSIS — M1811 Unilateral primary osteoarthritis of first carpometacarpal joint, right hand: Secondary | ICD-10-CM | POA: Diagnosis not present

## 2014-12-17 DIAGNOSIS — S62211A Bennett's fracture, right hand, initial encounter for closed fracture: Secondary | ICD-10-CM | POA: Diagnosis not present

## 2014-12-18 ENCOUNTER — Other Ambulatory Visit: Payer: Self-pay | Admitting: Geriatric Medicine

## 2014-12-18 ENCOUNTER — Other Ambulatory Visit (HOSPITAL_COMMUNITY)
Admission: RE | Admit: 2014-12-18 | Discharge: 2014-12-18 | Disposition: A | Discharge status: Home / Self Care | Payer: Medicare Other | Source: Ambulatory Visit | Attending: Interventional Radiology | Admitting: Interventional Radiology

## 2014-12-18 ENCOUNTER — Ambulatory Visit
Admission: RE | Admit: 2014-12-18 | Discharge: 2014-12-18 | Disposition: A | Discharge status: Home / Self Care | Payer: Medicare Other | Source: Ambulatory Visit | Attending: Geriatric Medicine | Admitting: Geriatric Medicine

## 2014-12-18 DIAGNOSIS — E041 Nontoxic single thyroid nodule: Secondary | ICD-10-CM | POA: Diagnosis not present

## 2014-12-18 DIAGNOSIS — E0789 Other specified disorders of thyroid: Secondary | ICD-10-CM | POA: Diagnosis not present

## 2014-12-19 DIAGNOSIS — M1811 Unilateral primary osteoarthritis of first carpometacarpal joint, right hand: Secondary | ICD-10-CM | POA: Diagnosis not present

## 2014-12-19 DIAGNOSIS — S62211A Bennett's fracture, right hand, initial encounter for closed fracture: Secondary | ICD-10-CM | POA: Diagnosis not present

## 2014-12-19 DIAGNOSIS — S92311A Displaced fracture of first metatarsal bone, right foot, initial encounter for closed fracture: Secondary | ICD-10-CM | POA: Diagnosis not present

## 2014-12-29 DIAGNOSIS — S62211D Bennett's fracture, right hand, subsequent encounter for fracture with routine healing: Secondary | ICD-10-CM | POA: Diagnosis not present

## 2014-12-29 DIAGNOSIS — M1811 Unilateral primary osteoarthritis of first carpometacarpal joint, right hand: Secondary | ICD-10-CM | POA: Diagnosis not present

## 2015-01-12 ENCOUNTER — Other Ambulatory Visit: Payer: Self-pay | Admitting: Surgery

## 2015-01-12 DIAGNOSIS — E041 Nontoxic single thyroid nodule: Secondary | ICD-10-CM

## 2015-01-12 DIAGNOSIS — D440 Neoplasm of uncertain behavior of thyroid gland: Secondary | ICD-10-CM | POA: Diagnosis not present

## 2015-01-22 ENCOUNTER — Ambulatory Visit: Payer: Self-pay | Admitting: Surgery

## 2015-01-28 DIAGNOSIS — S62211D Bennett's fracture, right hand, subsequent encounter for fracture with routine healing: Secondary | ICD-10-CM | POA: Diagnosis not present

## 2015-01-28 DIAGNOSIS — M1811 Unilateral primary osteoarthritis of first carpometacarpal joint, right hand: Secondary | ICD-10-CM | POA: Diagnosis not present

## 2015-02-18 DIAGNOSIS — S62211D Bennett's fracture, right hand, subsequent encounter for fracture with routine healing: Secondary | ICD-10-CM | POA: Diagnosis not present

## 2015-02-18 DIAGNOSIS — M1811 Unilateral primary osteoarthritis of first carpometacarpal joint, right hand: Secondary | ICD-10-CM | POA: Diagnosis not present

## 2015-02-27 ENCOUNTER — Ambulatory Visit (INDEPENDENT_AMBULATORY_CARE_PROVIDER_SITE_OTHER): Payer: Medicare Other | Admitting: Cardiology

## 2015-02-27 ENCOUNTER — Encounter: Payer: Self-pay | Admitting: Cardiology

## 2015-02-27 VITALS — BP 132/68 | HR 59 | Ht 65.0 in | Wt 168.6 lb

## 2015-02-27 DIAGNOSIS — I350 Nonrheumatic aortic (valve) stenosis: Secondary | ICD-10-CM | POA: Diagnosis not present

## 2015-02-27 DIAGNOSIS — R001 Bradycardia, unspecified: Secondary | ICD-10-CM

## 2015-02-27 DIAGNOSIS — I10 Essential (primary) hypertension: Secondary | ICD-10-CM | POA: Diagnosis not present

## 2015-02-27 NOTE — Patient Instructions (Signed)
Medication Instructions:  Your physician recommends that you continue on your current medications as directed. Please refer to the Current Medication list given to you today.   Labwork: NONE  Testing/Procedures: NONE  Follow-Up: Your physician wants you to follow-up in: 6 months with Dr. Skains. You will receive a reminder letter in the mail two months in advance. If you don't receive a letter, please call our office to schedule the follow-up appointment.   Any Other Special Instructions Will Be Listed Below (If Applicable).   

## 2015-02-27 NOTE — Progress Notes (Signed)
[facility] [address] [place], [place]  [address] Phone: [phone] Fax:  [phone]  Date:  02/27/2015   Patient ID:  [name], DOB 1927-06-24, MRN [id number]   PCP:  [name], MD  Cardiologist:  Marlou Porch  History of Present Illness: [name] is a 81 y.o. male originally from [place] with history of HTN, RA on methotrexate, depression, moderate AS by echo 06/30/14, noncardiac CP 07/2010 with normal cors at that time (possibly r/t anxiety) who presents to clinic for follow up. He is here with an interpreter.  He is doing very well, no complaints, no chest pain, no shortness of breath. Prior atypical chest pain episode resolved.    Recent Labs: 06/19/2014: B Natriuretic Peptide 41.8; Creatinine, Ser 1.09; Hemoglobin 13.5; Potassium 4.4  Wt Readings from Last 3 Encounters:  02/27/15 168 lb 9.6 oz (76.476 kg)  07/18/14 171 lb (77.565 kg)  06/20/14 171 lb (77.565 kg)     Past Medical History  Diagnosis Date  . Gilbert's syndrome   . Thyroid nodule     noted 11-2009  . Moderate aortic stenosis     a. Last echo 11/2013.  Marland Kitchen Rotator cuff syndrome of right shoulder 08/01/2013  . Hypertension   . Depression   . Internal hemorrhoid, bleeding   . History of syncope     2002--  vasovagal  . History of kidney stones   . Diverticulosis of colon   . RA (rheumatoid arthritis)     bilateral hands/ wrist--  seronegative  . History of gastroenteritis   . Nocturia   . BPH (benign prostatic hypertrophy)   . Hearing loss   . Wears dentures   . At risk for sleep apnea     STOP-BANG= 4    SENT TO PCP 04-04-2014  . Normal coronary arteries     a. Cath 07/2010: normal coronaries. Felt to have noncardiac CP/SOB at that time possibly r/t anxiety.    Current Outpatient Prescriptions  Medication Sig Dispense Refill  . AVODART 0.5 MG capsule Take 0.5 mg by mouth daily.    . Cholecalciferol (VITAMIN D3) 2000 UNITS TABS Take 1 capsule by mouth daily.    Marland Kitchen DIOVAN 80 MG  tablet Take 80 mg by mouth every morning.     . hydrochlorothiazide (MICROZIDE) 12.5 MG capsule Take 12.5 mg by mouth daily.    Marland Kitchen ibuprofen (ADVIL,MOTRIN) 800 MG tablet Take 800 mg by mouth every 8 (eight) hours as needed for moderate pain.     . methotrexate (RHEUMATREX) 2.5 MG tablet Take 22.5 mg by mouth once a week. On Monday    . Omega-3 Fatty Acids (FISH OIL) 1000 MG CAPS Take 1 capsule by mouth daily.    . psyllium (METAMUCIL) 58.6 % packet Take 1 packet by mouth daily.    . simvastatin (ZOCOR) 20 MG tablet TAKE 1 TABLET BY MOUTH EVERY NIGHT AT BEDTIME 30 tablet 0   No current facility-administered medications for this visit.    Allergies:   Citrus and Eggs or egg-derived products   Social History:  The patient  reports that he quit smoking about 33 years ago. His smoking use included Cigarettes. He started smoking about 41 years ago. He quit after 40 years of use. He has never used smokeless tobacco. He reports that he drinks alcohol. He reports that he does not use illicit drugs.   Family History:  The patient's family history includes Breast cancer in his sister; CVA in his father; Diabetes  in his brother and brother; Pulmonary embolism (age of onset: 46) in his mother.   ROS:  Please see the history of present illness.    All other systems reviewed and negative.   PHYSICAL EXAM:  VS:  BP 132/68 mmHg  Pulse 59  Ht 5\' 5"  (1.651 m)  Wt 168 lb 9.6 oz (76.476 kg)  BMI 28.06 kg/m2  SpO2 94% Well nourished, well developed WM, in no acute distress HEENT: normal Neck: no JVD, carotid bruit from aortic stenosis.  Cardiac:  RRR; 3/6 SEM, S2 difficult to hear Lungs:  clear to auscultation bilaterally, no wheezing, rhonchi or rales Abd: soft, nontender, no hepatomegaly Ext: no edema Skin: warm and dry Neuro:  moves all extremities spontaneously, no focal abnormalities noted  EKG:  Sinus bradycardia 53bpm otherwise no acute changes     ASSESSMENT AND PLAN:  1. Prior chest  pain/dyspnea with history of normal cors 2012 - symptoms have resolved.   2. Moderate aortic stenosis - 06/30/14-Left ventricle: ejection fraction  65%to 70%.  - Aortic valve: Peak and mean gradients through the valve are 63 and 37 mm Hgrespectively consistent with moderate AS. There was mildregurgitation. - Pulmonary arteries: PA peak pressure: 31 mm Hg (S). 3. Essential HTN - controlled. 4. Sinus bradycardia - stable, no symptoms. 5. We will have a six-month follow-up. Consider echo at that time.  At some point soon he may develop severe aortic stenosis at which time surgical replacement may need to be required.  Dispo: [name], MD   02/27/2015 1:52 PM

## 2015-03-31 ENCOUNTER — Other Ambulatory Visit: Payer: Self-pay | Admitting: *Deleted

## 2015-03-31 DIAGNOSIS — Z23 Encounter for immunization: Secondary | ICD-10-CM | POA: Diagnosis not present

## 2015-03-31 NOTE — Telephone Encounter (Signed)
Medication was prescribed by Melina Copa. Ok to refill under Dr Marlou Porch?

## 2015-04-01 MED ORDER — OMEPRAZOLE 20 MG PO CPDR: 20.0000 mg | DELAYED_RELEASE_CAPSULE | Freq: Every day | ORAL | Status: DC

## 2015-04-01 NOTE — Telephone Encounter (Signed)
OK to refill

## 2015-04-02 DIAGNOSIS — M2550 Pain in unspecified joint: Secondary | ICD-10-CM | POA: Diagnosis not present

## 2015-04-02 DIAGNOSIS — M0609 Rheumatoid arthritis without rheumatoid factor, multiple sites: Secondary | ICD-10-CM | POA: Diagnosis not present

## 2015-04-02 DIAGNOSIS — Z79899 Other long term (current) drug therapy: Secondary | ICD-10-CM | POA: Diagnosis not present

## 2015-04-29 ENCOUNTER — Encounter (HOSPITAL_COMMUNITY): Payer: Self-pay

## 2015-04-29 ENCOUNTER — Ambulatory Visit (HOSPITAL_COMMUNITY)
Admission: RE | Admit: 2015-04-29 | Discharge: 2015-04-29 | Disposition: A | Discharge status: Home / Self Care | Payer: Medicare Other | Source: Ambulatory Visit | Attending: Anesthesiology | Admitting: Anesthesiology

## 2015-04-29 ENCOUNTER — Encounter (HOSPITAL_COMMUNITY)
Admission: RE | Admit: 2015-04-29 | Discharge: 2015-04-29 | Disposition: A | Discharge status: Home / Self Care | Payer: Medicare Other | Source: Ambulatory Visit | Attending: Surgery | Admitting: Surgery

## 2015-04-29 DIAGNOSIS — Z87891 Personal history of nicotine dependence: Secondary | ICD-10-CM | POA: Diagnosis not present

## 2015-04-29 DIAGNOSIS — E041 Nontoxic single thyroid nodule: Secondary | ICD-10-CM

## 2015-04-29 DIAGNOSIS — Z01812 Encounter for preprocedural laboratory examination: Secondary | ICD-10-CM | POA: Diagnosis not present

## 2015-04-29 DIAGNOSIS — K219 Gastro-esophageal reflux disease without esophagitis: Secondary | ICD-10-CM | POA: Diagnosis not present

## 2015-04-29 DIAGNOSIS — I10 Essential (primary) hypertension: Secondary | ICD-10-CM | POA: Insufficient documentation

## 2015-04-29 DIAGNOSIS — M069 Rheumatoid arthritis, unspecified: Secondary | ICD-10-CM | POA: Insufficient documentation

## 2015-04-29 DIAGNOSIS — Z01818 Encounter for other preprocedural examination: Secondary | ICD-10-CM | POA: Insufficient documentation

## 2015-04-29 DIAGNOSIS — Z9889 Other specified postprocedural states: Secondary | ICD-10-CM | POA: Diagnosis not present

## 2015-04-29 HISTORY — DX: Unspecified hearing loss, unspecified ear: H91.90

## 2015-04-29 HISTORY — DX: Gastro-esophageal reflux disease without esophagitis: K21.9

## 2015-04-29 HISTORY — DX: Unilateral inguinal hernia, without obstruction or gangrene, not specified as recurrent: K40.90

## 2015-04-29 LAB — BASIC METABOLIC PANEL
Anion gap: 9 (ref 5–15)
BUN: 21 mg/dL — AB (ref 6–20)
CALCIUM: 9.4 mg/dL (ref 8.9–10.3)
CO2: 27 mmol/L (ref 22–32)
Chloride: 104 mmol/L (ref 101–111)
Creatinine, Ser: 1.18 mg/dL (ref 0.61–1.24)
GFR calc Af Amer: >60 mL/min (ref >60)
GFR, EST NON AFRICAN AMERICAN: 54 mL/min — AB (ref >60)
Glucose, Bld: 112 mg/dL — ABNORMAL HIGH (ref 65–99)
Potassium: 4.9 mmol/L (ref 3.5–5.1)
Sodium: 140 mmol/L (ref 135–145)

## 2015-04-29 LAB — CBC
HEMATOCRIT: 40 % (ref 39.0–52.0)
Hemoglobin: 13.2 g/dL (ref 13.0–17.0)
MCH: 30.1 pg (ref 26.0–34.0)
MCHC: 33 g/dL (ref 30.0–36.0)
MCV: 91.1 fL (ref 78.0–100.0)
Platelets: 188 10*3/uL (ref 150–400)
RBC: 4.39 MIL/uL (ref 4.22–5.81)
RDW: 13.6 % (ref 11.5–15.5)
WBC: 6.7 10*3/uL (ref 4.0–10.5)

## 2015-04-29 NOTE — Progress Notes (Signed)
Pt accompanied by daughter, Claris Che and interpreter. Pt denies SOB and chest pain but is under the care of Dr. Marlou Porch , cardiology. Spoke with Ebony Hail, PA, anesthesia, regarding pre-op instructions for Methotrexate; Ebony Hail advised that pt contact prescriber for pre-op instructions ( Rheumatologist). Pt chart forwarded to [place], [place], anesthesia to review pt cardiac history.

## 2015-04-29 NOTE — Pre-Procedure Instructions (Signed)
[name]  04/29/2015      [facility] - [place], [address] [place] [address] Phone: [phone] Fax: [phone]  [facility] [phone] [place], [place] [address] Phone: [phone] Fax: [phone]    Your procedure is scheduled on Tuesday, May 05, 2015  Report to [facility] Admitting at 5:30 A.M.  Call this number if you have problems the morning of surgery:  [phone]   Remember:  Do not eat food or drink liquids after midnight Monday, May 04, 2015  Take these medicines the morning of surgery with A SIP OF WATER : dutasteride (AVODART), escitalopram (LEXAPRO), omeprazole (PRILOSEC)  Stop taking Aspirin, vitamins, fish oil, and herbal medications. Do not take any NSAIDs ie: Ibuprofen, Advil, Naproxen or any medication containing Aspirin; stop now.  Do not wear jewelry, make-up or nail polish.  Do not wear lotions, powders, or perfumes.  You may not wear deodorant.  Do not shave 48 hours prior to surgery.  Men may shave face and neck.  Do not bring valuables to the hospital.  [facility] is not responsible for any belongings or valuables.  Contacts, dentures or bridgework may not be worn into surgery.  Leave your suitcase in the car.  After surgery it may be brought to your room.  For patients admitted to the hospital, discharge time will be determined by your treatment team.  Patients discharged the day of surgery will not be allowed to drive home.   Name and phone number of your driver:   Special instructions: [facility] - Preparing for Surgery  Before surgery, you can play an important role.  Because skin is not sterile, your skin needs to be as free of germs as possible.  You can reduce the number of germs on you skin by washing with CHG (chlorahexidine gluconate) soap before  surgery.  CHG is an antiseptic cleaner which kills germs and bonds with the skin to continue killing germs even after washing.  Please DO NOT use if you have an allergy to CHG or antibacterial soaps.  If your skin becomes reddened/irritated stop using the CHG and inform your nurse when you arrive at Short Stay.  Do not shave (including legs and underarms) for at least 48 hours prior to the first CHG shower.  You may shave your face.  Please follow these instructions carefully:   1.  Shower with CHG Soap the night before surgery and the morning of Surgery.  2.  If you choose to wash your hair, wash your hair first as usual with your normal shampoo.  3.  After you shampoo, rinse your hair and body thoroughly to remove the Shampoo.  4.  Use CHG as you would any other liquid soap.  You can apply chg directly  to the skin and wash gently with scrungie or a clean washcloth.  5.  Apply the CHG Soap to your body ONLY FROM THE NECK DOWN.  Do not use on open wounds or open sores.  Avoid contact with your eyes, ears, mouth and genitals (private parts).  Wash genitals (private parts) with your normal soap.  6.  Wash thoroughly, paying special attention to the area where your surgery will be performed.  7.  Thoroughly rinse your body with warm water from the neck down.  8.  DO  NOT shower/wash with your normal soap after using and rinsing off the CHG Soap.  9.  Pat yourself dry with a clean towel.            10.  Wear clean pajamas.            11.  Place clean sheets on your bed the night of your first shower and do not sleep with pets.  Day of Surgery  Do not apply any lotions/deodorants the morning of surgery.  Please wear clean clothes to the hospital/surgery center.  Please read over the following fact sheets that you were given. Pain Booklet, Coughing and Deep Breathing and Surgical Site Infection Prevention

## 2015-04-30 ENCOUNTER — Encounter (HOSPITAL_COMMUNITY): Payer: Self-pay

## 2015-04-30 ENCOUNTER — Ambulatory Visit: Payer: Self-pay | Admitting: Surgery

## 2015-04-30 NOTE — Progress Notes (Signed)
Anesthesia Chart Review: Patient is a 81 year old male scheduled for right thyroid lobectomy on 11/221/6 by Dr. Harlow Asa.  History includes former smoker, Gilbert's Syndrome, normal coronaries '12, moderate AS, HTN, depression, vasovagal syncope '02, RA, BPH, hearing loss, dentures, GERD, migraines, hepatitis A. Patient is originally from [place]. PCP is Dr. Lajean Manes. Cardiologist is Dr. Candee Furbish, last visit 02/27/15 and recommended six month follow-up with consideration of a repeat echo at that time.  Meds include ASA, Diovan, Avodart, Lexapro, Folvite, HCTZ, methotrexate, Prilosec, Zocor.  06/30/14 EKG: SB.  06/30/14 Echo: Study Conclusions - Left ventricle: The cavity size was normal. Systolic function was vigorous. The estimated ejection fraction was in the range of 65%to 70%. Doppler parameters are consistent with abnormal leftventricular relaxation (grade 1 diastolic dysfunction). - Aortic valve: AV is thickned, calcified with restricted motionPeak and mean gradients through the valve are 63 and 37 mm Hgrespectively consistent with moderate AS. There was mildregurgitation. (VTI ratio of LVOT to aortic valve: 0.35. Valve area (VTI): 1.21 cm^2. Indexed valve area (VTI): 0.63 cm^2/m^2. Valve area (Vmax): 1.18 cm^2. Indexed valve area (Vmax): 0.62 cm^2/m^2. Mean velocity ratio of LVOT to aortic valve: 0.33. Valve area (Vmean): 1.15 cm^2. Indexed valve area (Vmean): 0.6 cm^2/m^2. Mean gradient (S): 34 mm Hg. Peak gradient (S): 63 mm Hg.) - Pulmonary arteries: PA peak pressure: 31 mm Hg (S). (Previous AV measurement by 11/26/13 echo: VTI ratio of LVOT to aortic valve: 0.38. Valve area (VTI): 1.18 cm^2. Indexed valve area (VTI): 0.61 cm^2/m^2. Valve area (Vmax): 1.09 cm^2. Indexed valve area (Vmax): 0.57 cm^2/m^2.  Mean gradient (S): 35 mm Hg. Peak gradient (S): 57 mmHg.)  07/19/14 Cardiac cath:  ASSESSMENT: 1. Moderate aortic stenosis. 2. Normal coronary arteries. 3. Normal left ventricular  function. 4. Normal right heart pressures.  04/29/15 CXR: IMPRESSION: No active lung disease. Probable small granuloma at the left lung base.  Preoperative labs noted.   Patient was seen by Dr. Marlou Porch within the past three months with consideration of echo in 6 months. His last echo done earlier this year showed stable moderate AS. If no acute changes or new CV symptomology then I would anticipate that he could proceed as planned. Discussed with anesthesiologist Dr. Linna Caprice.  [facility] Short Stay Center/Anesthesiology Phone [phone] 04/30/2015 2:32 PM

## 2015-05-04 ENCOUNTER — Encounter (HOSPITAL_COMMUNITY): Payer: Self-pay | Admitting: Surgery

## 2015-05-04 DIAGNOSIS — D440 Neoplasm of uncertain behavior of thyroid gland: Secondary | ICD-10-CM | POA: Diagnosis present

## 2015-05-04 MED ORDER — CEFAZOLIN SODIUM-DEXTROSE 2-3 GM-% IV SOLR
2.0000 g | INTRAVENOUS | Status: AC
  Administered 2015-05-05: 2 g via INTRAVENOUS

## 2015-05-04 NOTE — H&P (Signed)
  General Surgery [facility]  Loyce Dys. Fialkowski DOB: 08-Jul-1927 Widowed / Language: [place] / Race: White Male  History of Present Illness Patient words: thyroid nodule.  The patient is a 81 year old male who presents with a thyroid nodule. Patient is referred by Dr. Lajean Manes for evaluation of right thyroid nodule with atypia. Patient had been found to have a right-sided thyroid nodule approximately 2 years ago during evaluation for dysphagia and voice changes. He was found to have silent reflux and was treated with omeprazole with resolution of his symptoms. History is provided by his daughter. Patient does speak some [place]. His native language is [place]. Patient was recently evaluated again by Dr. Felipa Eth and repeat thyroid ultrasound was obtained. This showed an enlarged right thyroid lobe measuring 7.0 x 2.8 x 2.9 cm and contained a solid nodule measuring 4.3 x 2.2 x 3.0 cm. Left thyroid lobe was essentially normal. Ultrasound-guided fine-needle aspiration biopsy was obtained on December 18, 2014. This shows a follicular lesion of undetermined significance with atypia, Bethesda category III. Patient has no prior history of thyroid disease. He has never been on thyroid medication. His thyroid function is reportedly normal. Patient has had no prior head or neck surgery. There is no family history of thyroid disease and specifically no history of thyroid cancer. There is no family history of other endocrine neoplasms.   Allergies CITRUS FRUIT Eggs  Medication History Simvastatin (20MG  Tablet, Oral) Active. Omeprazole (20MG  Capsule DR, Oral) Active. Methotrexate (2.5MG  Tablet, Oral) Active. Hydrochlorothiazide (12.5MG  Capsule, Oral) Active. Escitalopram Oxalate (20MG  Tablet, Oral) Active. Diovan (80MG  Tablet, Oral) Active. Metamucil (58.6% Packet, Oral) Active. Medications Reconciled  Vitals Weight: 170.2 lb Height: 66in Body Surface  Area: 1.9 m Body Mass Index: 27.47 kg/m  Temp.: 65F(Oral)  Pulse: 84 (Regular)  BP: 124/80 (Sitting, Left Arm, Standard)  Physical Exam  General - appears comfortable, no distress; not diaphorectic  HEENT - normocephalic; sclerae clear, gaze conjugate; mucous membranes moist, dentition fair; voice normal  Neck - symmetric on extension; no palpable anterior or posterior cervical adenopathy; no palpable masses in the thyroid bed  Chest - clear bilaterally without rhonchi, rales, or wheeze  Cor - regular rhythm with normal rate; no significant murmur  Ext - non-tender without significant edema or lymphedema; right hand is in a splint  Neuro - grossly intact; no tremor   Assessment & Plan   NEOPLASM OF UNCERTAIN BEHAVIOR OF THYROID GLAND (237.4  D44.0)  Patient presents accompanied by his daughter to discuss right thyroid nodule and his cytopathology report. I provided them with written literature on thyroid disease and thyroid surgery to review at home.  Patient has a dominant right thyroid nodule measuring 4.3 cm in size. Cytopathology shows some atypia, putting him at risk for thyroid malignancy. Based on his current biopsy, his risk of malignancy is approximately 25%.  Plan right thyroid lobectomy for definitive diagnosis.  The risks and benefits of the procedure have been discussed at length with the patient.  The patient understands the proposed procedure, potential alternative treatments, and the course of recovery to be expected.  All of the patient's questions have been answered at this time.  The patient wishes to proceed with surgery.  Earnstine Regal, MD, [facility] Office: [phone]

## 2015-05-05 ENCOUNTER — Encounter (HOSPITAL_COMMUNITY)
Admission: RE | Disposition: A | Discharge status: Home / Self Care | Payer: Self-pay | Source: Ambulatory Visit | Attending: Surgery

## 2015-05-05 ENCOUNTER — Encounter (HOSPITAL_COMMUNITY): Payer: Self-pay | Admitting: Surgery

## 2015-05-05 ENCOUNTER — Ambulatory Visit (HOSPITAL_COMMUNITY): Payer: Medicare Other | Admitting: Vascular Surgery

## 2015-05-05 ENCOUNTER — Ambulatory Visit (HOSPITAL_COMMUNITY): Payer: Medicare Other | Admitting: Anesthesiology

## 2015-05-05 ENCOUNTER — Observation Stay (HOSPITAL_COMMUNITY)
Admission: RE | Admit: 2015-05-05 | Discharge: 2015-05-06 | Disposition: A | Discharge status: Home / Self Care | Payer: Medicare Other | Source: Ambulatory Visit | Attending: Surgery | Admitting: Surgery

## 2015-05-05 DIAGNOSIS — Z87891 Personal history of nicotine dependence: Secondary | ICD-10-CM | POA: Insufficient documentation

## 2015-05-05 DIAGNOSIS — I10 Essential (primary) hypertension: Secondary | ICD-10-CM | POA: Insufficient documentation

## 2015-05-05 DIAGNOSIS — I350 Nonrheumatic aortic (valve) stenosis: Secondary | ICD-10-CM | POA: Insufficient documentation

## 2015-05-05 DIAGNOSIS — D489 Neoplasm of uncertain behavior, unspecified: Secondary | ICD-10-CM | POA: Diagnosis present

## 2015-05-05 DIAGNOSIS — I340 Nonrheumatic mitral (valve) insufficiency: Secondary | ICD-10-CM | POA: Diagnosis not present

## 2015-05-05 DIAGNOSIS — F329 Major depressive disorder, single episode, unspecified: Secondary | ICD-10-CM | POA: Diagnosis not present

## 2015-05-05 DIAGNOSIS — M069 Rheumatoid arthritis, unspecified: Secondary | ICD-10-CM | POA: Diagnosis not present

## 2015-05-05 DIAGNOSIS — Z7982 Long term (current) use of aspirin: Secondary | ICD-10-CM | POA: Diagnosis not present

## 2015-05-05 DIAGNOSIS — D34 Benign neoplasm of thyroid gland: Secondary | ICD-10-CM | POA: Diagnosis not present

## 2015-05-05 DIAGNOSIS — E042 Nontoxic multinodular goiter: Secondary | ICD-10-CM | POA: Diagnosis not present

## 2015-05-05 DIAGNOSIS — D440 Neoplasm of uncertain behavior of thyroid gland: Secondary | ICD-10-CM | POA: Diagnosis not present

## 2015-05-05 DIAGNOSIS — K219 Gastro-esophageal reflux disease without esophagitis: Secondary | ICD-10-CM | POA: Diagnosis not present

## 2015-05-05 HISTORY — PX: THYROID LOBECTOMY: SHX420

## 2015-05-05 SURGERY — LOBECTOMY, THYROID
Anesthesia: General | Site: Neck | Laterality: Right

## 2015-05-05 MED ORDER — HYDROCHLOROTHIAZIDE 12.5 MG PO CAPS
12.5000 mg | ORAL_CAPSULE | Freq: Every day | ORAL | Status: DC
  Administered 2015-05-05 – 2015-05-06 (×2): 12.5 mg via ORAL
  Filled 2015-05-05 (×2): qty 1

## 2015-05-05 MED ORDER — ACETAMINOPHEN 650 MG RE SUPP: 650.0000 mg | Freq: Four times a day (QID) | RECTAL | Status: DC | PRN

## 2015-05-05 MED ORDER — ACETAMINOPHEN 325 MG PO TABS: 650.0000 mg | ORAL_TABLET | Freq: Four times a day (QID) | ORAL | Status: DC | PRN

## 2015-05-05 MED ORDER — FENTANYL CITRATE (PF) 100 MCG/2ML IJ SOLN
INTRAMUSCULAR | Status: AC
  Filled 2015-05-05: qty 2

## 2015-05-05 MED ORDER — LIDOCAINE HCL (CARDIAC) 20 MG/ML IV SOLN
INTRAVENOUS | Status: DC | PRN
  Administered 2015-05-05: 60 mg via INTRAVENOUS

## 2015-05-05 MED ORDER — 0.9 % SODIUM CHLORIDE (POUR BTL) OPTIME
TOPICAL | Status: DC | PRN
  Administered 2015-05-05: 1000 mL

## 2015-05-05 MED ORDER — ONDANSETRON 4 MG PO TBDP: 4.0000 mg | ORAL_TABLET | Freq: Four times a day (QID) | ORAL | Status: DC | PRN

## 2015-05-05 MED ORDER — NEOSTIGMINE METHYLSULFATE 10 MG/10ML IV SOLN
INTRAVENOUS | Status: DC | PRN
  Administered 2015-05-05: 4 mg via INTRAVENOUS

## 2015-05-05 MED ORDER — HYDROMORPHONE HCL 1 MG/ML IJ SOLN: 1.0000 mg | INTRAMUSCULAR | Status: DC | PRN

## 2015-05-05 MED ORDER — PROPOFOL 10 MG/ML IV BOLUS
INTRAVENOUS | Status: DC | PRN
  Administered 2015-05-05: 100 mg via INTRAVENOUS

## 2015-05-05 MED ORDER — IRBESARTAN 75 MG PO TABS
75.0000 mg | ORAL_TABLET | Freq: Every day | ORAL | Status: DC
  Administered 2015-05-05 – 2015-05-06 (×2): 75 mg via ORAL
  Filled 2015-05-05 (×2): qty 1

## 2015-05-05 MED ORDER — HEMOSTATIC AGENTS (NO CHARGE) OPTIME
TOPICAL | Status: DC | PRN
  Administered 2015-05-05: 1 via TOPICAL

## 2015-05-05 MED ORDER — KCL IN DEXTROSE-NACL 20-5-0.45 MEQ/L-%-% IV SOLN
INTRAVENOUS | Status: DC
  Administered 2015-05-05: 12:00:00 via INTRAVENOUS
  Filled 2015-05-05 (×2): qty 1000

## 2015-05-05 MED ORDER — HYDROCODONE-ACETAMINOPHEN 5-325 MG PO TABS
1.0000 | ORAL_TABLET | ORAL | Status: DC | PRN
  Administered 2015-05-05: 2 via ORAL
  Administered 2015-05-05: 1 via ORAL
  Administered 2015-05-06: 2 via ORAL
  Filled 2015-05-05: qty 1
  Filled 2015-05-05 (×2): qty 2

## 2015-05-05 MED ORDER — FENTANYL CITRATE (PF) 100 MCG/2ML IJ SOLN
INTRAMUSCULAR | Status: DC | PRN
  Administered 2015-05-05: 50 ug via INTRAVENOUS
  Administered 2015-05-05: 100 ug via INTRAVENOUS

## 2015-05-05 MED ORDER — PANTOPRAZOLE SODIUM 40 MG PO TBEC
40.0000 mg | DELAYED_RELEASE_TABLET | Freq: Every day | ORAL | Status: DC
  Administered 2015-05-06: 40 mg via ORAL
  Filled 2015-05-05: qty 1

## 2015-05-05 MED ORDER — ONDANSETRON HCL 4 MG/2ML IJ SOLN
INTRAMUSCULAR | Status: DC | PRN
  Administered 2015-05-05: 4 mg via INTRAVENOUS

## 2015-05-05 MED ORDER — SUCCINYLCHOLINE CHLORIDE 20 MG/ML IJ SOLN
INTRAMUSCULAR | Status: AC
  Filled 2015-05-05: qty 1

## 2015-05-05 MED ORDER — FENTANYL CITRATE (PF) 250 MCG/5ML IJ SOLN
INTRAMUSCULAR | Status: AC
  Filled 2015-05-05: qty 5

## 2015-05-05 MED ORDER — PHENOL 1.4 % MT LIQD
1.0000 | OROMUCOSAL | Status: DC | PRN
  Administered 2015-05-05: 1 via OROMUCOSAL
  Filled 2015-05-05: qty 177

## 2015-05-05 MED ORDER — GLYCOPYRROLATE 0.2 MG/ML IJ SOLN
INTRAMUSCULAR | Status: DC | PRN
  Administered 2015-05-05: 0.6 mg via INTRAVENOUS

## 2015-05-05 MED ORDER — ESCITALOPRAM OXALATE 20 MG PO TABS
20.0000 mg | ORAL_TABLET | Freq: Every day | ORAL | Status: DC
  Administered 2015-05-06: 20 mg via ORAL
  Filled 2015-05-05: qty 1

## 2015-05-05 MED ORDER — ROCURONIUM BROMIDE 100 MG/10ML IV SOLN
INTRAVENOUS | Status: DC | PRN
  Administered 2015-05-05: 30 mg via INTRAVENOUS

## 2015-05-05 MED ORDER — DEXTROSE 5 % IV SOLN
10.0000 mg | INTRAVENOUS | Status: DC | PRN
  Administered 2015-05-05: 10 ug/min via INTRAVENOUS

## 2015-05-05 MED ORDER — SUCCINYLCHOLINE CHLORIDE 20 MG/ML IJ SOLN
INTRAMUSCULAR | Status: DC | PRN
  Administered 2015-05-05: 80 mg via INTRAVENOUS

## 2015-05-05 MED ORDER — ROCURONIUM BROMIDE 50 MG/5ML IV SOLN
INTRAVENOUS | Status: AC
  Filled 2015-05-05: qty 1

## 2015-05-05 MED ORDER — LACTATED RINGERS IV SOLN
INTRAVENOUS | Status: DC | PRN
  Administered 2015-05-05: 07:00:00 via INTRAVENOUS

## 2015-05-05 MED ORDER — PHENYLEPHRINE 40 MCG/ML (10ML) SYRINGE FOR IV PUSH (FOR BLOOD PRESSURE SUPPORT)
PREFILLED_SYRINGE | INTRAVENOUS | Status: AC
  Filled 2015-05-05: qty 10

## 2015-05-05 MED ORDER — PROPOFOL 10 MG/ML IV BOLUS
INTRAVENOUS | Status: AC
  Filled 2015-05-05: qty 20

## 2015-05-05 MED ORDER — FENTANYL CITRATE (PF) 100 MCG/2ML IJ SOLN
25.0000 ug | INTRAMUSCULAR | Status: DC | PRN
  Administered 2015-05-05 (×2): 25 ug via INTRAVENOUS
  Administered 2015-05-05: 50 ug via INTRAVENOUS
  Administered 2015-05-05: 25 ug via INTRAVENOUS

## 2015-05-05 MED ORDER — ONDANSETRON HCL 4 MG/2ML IJ SOLN: 4.0000 mg | Freq: Four times a day (QID) | INTRAMUSCULAR | Status: DC | PRN

## 2015-05-05 MED ORDER — ONDANSETRON HCL 4 MG/2ML IJ SOLN: 4.0000 mg | Freq: Once | INTRAMUSCULAR | Status: DC | PRN

## 2015-05-05 SURGICAL SUPPLY — 51 items
ATTRACTOMAT 16X20 MAGNETIC DRP (DRAPES) ×2 IMPLANT
BENZOIN TINCTURE PRP APPL 2/3 (GAUZE/BANDAGES/DRESSINGS) ×2 IMPLANT
BLADE SURG 10 STRL SS (BLADE) ×2 IMPLANT
BLADE SURG 15 STRL LF DISP TIS (BLADE) ×1 IMPLANT
BLADE SURG 15 STRL SS (BLADE) ×1
CANISTER SUCTION 2500CC (MISCELLANEOUS) ×2 IMPLANT
CHLORAPREP W/TINT 10.5 ML (MISCELLANEOUS) ×2 IMPLANT
CLIP TI MEDIUM 24 (CLIP) ×2 IMPLANT
CLIP TI WIDE RED SMALL 24 (CLIP) ×2 IMPLANT
CONT SPEC 4OZ CLIKSEAL STRL BL (MISCELLANEOUS) ×2 IMPLANT
COVER SURGICAL LIGHT HANDLE (MISCELLANEOUS) ×2 IMPLANT
CRADLE DONUT ADULT HEAD (MISCELLANEOUS) ×2 IMPLANT
DRAPE PED LAPAROTOMY (DRAPES) ×2 IMPLANT
DRAPE UTILITY XL STRL (DRAPES) ×2 IMPLANT
ELECT CAUTERY BLADE 6.4 (BLADE) ×2 IMPLANT
ELECT REM PT RETURN 9FT ADLT (ELECTROSURGICAL) ×2
ELECTRODE REM PT RTRN 9FT ADLT (ELECTROSURGICAL) ×1 IMPLANT
GAUZE SPONGE 4X4 12PLY STRL (GAUZE/BANDAGES/DRESSINGS) ×2 IMPLANT
GAUZE SPONGE 4X4 16PLY XRAY LF (GAUZE/BANDAGES/DRESSINGS) ×2 IMPLANT
GLOVE BIO SURGEON STRL SZ 6 (GLOVE) ×2 IMPLANT
GLOVE BIO SURGEON STRL SZ7 (GLOVE) ×2 IMPLANT
GLOVE BIO SURGEON STRL SZ7.5 (GLOVE) ×2 IMPLANT
GLOVE BIOGEL PI IND STRL 6 (GLOVE) ×1 IMPLANT
GLOVE BIOGEL PI IND STRL 7.0 (GLOVE) ×1 IMPLANT
GLOVE BIOGEL PI IND STRL 7.5 (GLOVE) ×2 IMPLANT
GLOVE BIOGEL PI INDICATOR 6 (GLOVE) ×1
GLOVE BIOGEL PI INDICATOR 7.0 (GLOVE) ×1
GLOVE BIOGEL PI INDICATOR 7.5 (GLOVE) ×2
GLOVE SURG ORTHO 8.0 STRL STRW (GLOVE) ×2 IMPLANT
GOWN STRL REUS W/ TWL LRG LVL3 (GOWN DISPOSABLE) ×3 IMPLANT
GOWN STRL REUS W/ TWL XL LVL3 (GOWN DISPOSABLE) ×1 IMPLANT
GOWN STRL REUS W/TWL LRG LVL3 (GOWN DISPOSABLE) ×3
GOWN STRL REUS W/TWL XL LVL3 (GOWN DISPOSABLE) ×1
HEMOSTAT SURGICEL 2X4 FIBR (HEMOSTASIS) ×2 IMPLANT
KIT BASIN OR (CUSTOM PROCEDURE TRAY) ×2 IMPLANT
KIT ROOM TURNOVER OR (KITS) ×2 IMPLANT
NS IRRIG 1000ML POUR BTL (IV SOLUTION) ×2 IMPLANT
PACK SURGICAL SETUP 50X90 (CUSTOM PROCEDURE TRAY) ×2 IMPLANT
PAD ARMBOARD 7.5X6 YLW CONV (MISCELLANEOUS) ×2 IMPLANT
PENCIL BUTTON HOLSTER BLD 10FT (ELECTRODE) ×2 IMPLANT
SHEARS HARMONIC 9CM CVD (BLADE) ×2 IMPLANT
SPONGE INTESTINAL PEANUT (DISPOSABLE) ×2 IMPLANT
STRIP CLOSURE SKIN 1/2X4 (GAUZE/BANDAGES/DRESSINGS) ×2 IMPLANT
SUT MNCRL AB 4-0 PS2 18 (SUTURE) ×2 IMPLANT
SUT SILK 2 0 (SUTURE) ×1
SUT SILK 2-0 18XBRD TIE 12 (SUTURE) ×1 IMPLANT
SUT VIC AB 3-0 SH 18 (SUTURE) ×4 IMPLANT
SYR BULB 3OZ (MISCELLANEOUS) ×2 IMPLANT
TOWEL OR 17X24 6PK STRL BLUE (TOWEL DISPOSABLE) ×2 IMPLANT
TOWEL OR 17X26 10 PK STRL BLUE (TOWEL DISPOSABLE) ×2 IMPLANT
TUBE CONNECTING 12X1/4 (SUCTIONS) ×2 IMPLANT

## 2015-05-05 NOTE — Op Note (Signed)
Procedure Note  Pre-operative Diagnosis:  Right thyroid neoplasm of uncertain behavior, Bethesda category III  Post-operative Diagnosis:  same  Surgeon:  Earnstine Regal, MD, FACS  Assistant:  Sharyn Dross, RNFA   Procedure:  Right thyroid lobectomy  Anesthesia:  General  Estimated Blood Loss:  minimal  Drains: none         Specimen: thyroid lobe to pathology  Indications:  The patient is a 81 year old male who presents with a thyroid nodule. Patient is referred by Dr. Lajean Manes for evaluation of right thyroid nodule with atypia. Patient had been found to have a right-sided thyroid nodule approximately 2 years ago during evaluation for dysphagia and voice changes. He was found to have silent reflux and was treated with omeprazole with resolution of his symptoms. History is provided by his daughter. Patient does speak some [place]. His native language is [place]. Patient was recently evaluated again by Dr. Felipa Eth and repeat thyroid ultrasound was obtained. This showed an enlarged right thyroid lobe measuring 7.0 x 2.8 x 2.9 cm and contained a solid nodule measuring 4.3 x 2.2 x 3.0 cm. Left thyroid lobe was essentially normal. Ultrasound-guided fine-needle aspiration biopsy was obtained on December 18, 2014. This shows a follicular lesion of undetermined significance with atypia, Bethesda category III.   Procedure Details: Procedure was done in OR #2 at the [facility].  The patient was brought to the operating room and placed in a supine position on the operating room table.  Following administration of general anesthesia, the patient was positioned and then prepped and draped in the usual aseptic fashion.  After ascertaining that an adequate level of anesthesia had been achieved, a Kocher incision was made with #15 blade.  Dissection was carried through subcutaneous tissues and platysma. Hemostasis was achieved with the electrocautery.  Skin flaps were elevated cephalad and  caudad from the thyroid notch to the sternal notch.  A self-retaining retractor was placed for exposure.  Strap muscles were incised in the midline and dissection was begun on the right side.  Strap muscles were reflected laterally.  The right thyroid lobe was mildly enlarged and multinodular with at least one cystic nodule.  The lobe was gently mobilized with blunt dissection.  Superior pole vessels were dissected out and divided individually between small and medium Ligaclips with the Harmonic scalpel.  The thyroid lobe was rolled anteriorly.  Branches of the inferior thyroid artery were divided between small Ligaclips with the Harmonic scalpel.  Inferior venous tributaries were divided between Ligaclips.  Both the superior and inferior parathyroid glands were identified and preserved on their vascular pedicles.  The recurrent laryngeal nerve was identified and preserved along its course which was very anterior and closely approximated to the ligament.  The ligament of Gwenlyn Found was released with the electrocautery and the gland was mobilized onto the anterior trachea. Isthmus was mobilized across the midline.  There was a small pyramidal lobe present which was resected with the thyroid isthmus.  The thyroid parenchyma was transected at the junction of the isthmus and contralateral thyroid lobe with the Harmonic scalpel.  The thyroid lobe and isthmus were submitted to pathology for review.  The neck was irrigated with warm saline.  Fibular was placed throughout the operative field.  Strap muscles were reapproximated in the midline with interrupted 3-0 Vicryl sutures.  Platysma was closed with interrupted 3-0 Vicryl sutures.  Skin was closed with a running 4-0 Monocryl subcuticular suture.  Wound was washed and dried and  benzoin and steri-strips were applied.  Dry gauze dressing was placed.  The patient was awakened from anesthesia and brought to the recovery room.  The patient tolerated the procedure well.   Earnstine Regal, MD, [facility] Office: [phone]

## 2015-05-05 NOTE — Transfer of Care (Signed)
Immediate Anesthesia Transfer of Care Note  Patient: [name]  Procedure(s) Performed: Procedure(s): RIGHT THYROID LOBECTOMY (Right)  Patient Location: PACU  Anesthesia Type:General  Level of Consciousness: awake, alert  and oriented  Airway & Oxygen Therapy: Patient connected to face mask oxygen  Post-op Assessment: Post -op Vital signs reviewed and stable  Post vital signs: stable  Last Vitals:  Filed Vitals:   05/05/15 0628  BP: 126/57  Pulse: 50  Temp: 36.6 C  Resp: 20    Complications: No apparent anesthesia complications

## 2015-05-05 NOTE — Anesthesia Postprocedure Evaluation (Addendum)
Anesthesia Post Note  Patient: [name]  Procedure(s) Performed: Procedure(s) (LRB): RIGHT THYROID LOBECTOMY (Right)  Patient location during evaluation: PACU Anesthesia Type: General Level of consciousness: awake and alert Pain management: pain level controlled Vital Signs Assessment: post-procedure vital signs reviewed and stable Respiratory status: spontaneous breathing, nonlabored ventilation, respiratory function stable and patient connected to nasal cannula oxygen Cardiovascular status: blood pressure returned to baseline and stable Postop Assessment: No signs of nausea or vomiting Anesthetic complications: no    Last Vitals:  Filed Vitals:   05/05/15 0628  BP: 126/57  Pulse: 50  Temp: 36.6 C  Resp: 20    Last Pain: There were no vitals filed for this visit.               Zenaida Deed

## 2015-05-05 NOTE — Interval H&P Note (Signed)
History and Physical Interval Note:  05/05/2015 7:18 AM  [name]  has presented today for surgery, with the diagnosis of THYROID NEOPLASM OF UNCERTAIN BEHAVIOR.  The various methods of treatment have been discussed with the patient and family. After consideration of risks, benefits and other options for treatment, the patient has consented to    Procedure(s): RIGHT THYROID LOBECTOMY (Right) as a surgical intervention .    The patient's history has been reviewed, patient examined, no change in status, stable for surgery.  I have reviewed the patient's chart and labs.  Questions were answered to the patient's satisfaction.    Earnstine Regal, MD, [facility] Office: [place]

## 2015-05-05 NOTE — Anesthesia Preprocedure Evaluation (Addendum)
Anesthesia Evaluation  Patient identified by MRN, date of birth, ID band Patient awake    Reviewed: Allergy & Precautions, H&P , NPO status , Patient's Chart, lab work & pertinent test results  Airway Mallampati: III  TM Distance: >3 FB Neck ROM: Full    Dental  (+) Edentulous Lower, Missing,    Pulmonary former smoker,    Pulmonary exam normal breath sounds clear to auscultation       Cardiovascular hypertension, Pt. on medications Normal cardiovascular exam+ Valvular Problems/Murmurs AS   06/30/14 Echo: Study Conclusions - Left ventricle: The cavity size was normal. Systolic function was vigorous. The estimated ejection fraction was in the range of 65%to 70%. Doppler parameters are consistent with abnormal leftventricular relaxation (grade 1 diastolic dysfunction). - Aortic valve: AV is thickned, calcified with restricted motionPeak and mean gradients through the valve are 63 and 37 mm Hgrespectively consistent with moderate AS. There was mildregurgitation. (VTI ratio of LVOT to aortic valve: 0.35. Valve area (VTI): 1.21 cm^2. Indexed valve area (VTI): 0.63 cm^2/m^2. Valve area (Vmax): 1.18 cm^2. Indexed valve area (Vmax): 0.62 cm^2/m^2. Mean velocity ratio of LVOT to aortic valve: 0.33. Valve area (Vmean): 1.15 cm^2. Indexed valve area (Vmean): 0.6 cm^2/m^2. Mean gradient (S): 34 mm Hg. Peak gradient (S): 63 mm Hg.) - Pulmonary arteries: PA peak pressure: 31 mm Hg (S). (Previous AV measurement by 11/26/13 echo: VTI ratio of LVOT to aortic valve: 0.38. Valve area (VTI): 1.18 cm^2. Indexed valve area (VTI): 0.61 cm^2/m^2. Valve area (Vmax): 1.09 cm^2. Indexed valve area (Vmax): 0.57 cm^2/m^2.  Mean gradient (S): 35 mm Hg. Peak gradient (S): 57 mmHg.)  07/19/14 Cardiac cath:  ASSESSMENT: 1. Moderate aortic stenosis. 2. Normal coronary arteries. 3. Normal left ventricular function. 4. Normal right heart pressures.    Neuro/Psych   Headaches, PSYCHIATRIC DISORDERS Depression    GI/Hepatic GERD  Medicated and Controlled,(+) Hepatitis -  Endo/Other  negative endocrine ROS  Renal/GU Renal disease     Musculoskeletal  (+) Arthritis , Rheumatoid disorders,    Abdominal   Peds  Hematology negative hematology ROS (+)   Anesthesia Other Findings   Reproductive/Obstetrics negative OB ROS                        Anesthesia Physical  Anesthesia Plan  ASA: III  Anesthesia Plan: General   Post-op Pain Management:    Induction: Intravenous  Airway Management Planned: Oral ETT  Additional Equipment: None  Intra-op Plan:   Post-operative Plan: Extubation in OR  Informed Consent: I have reviewed the patients History and Physical, chart, labs and discussed the procedure including the risks, benefits and alternatives for the proposed anesthesia with the patient or authorized representative who has indicated his/her understanding and acceptance.   Dental advisory given  Plan Discussed with: CRNA and Anesthesiologist  Anesthesia Plan Comments:         Anesthesia Quick Evaluation

## 2015-05-05 NOTE — Anesthesia Procedure Notes (Signed)
Procedure Name: Intubation Date/Time: 05/05/2015 7:29 AM Performed by: Lavell Luster Pre-anesthesia Checklist: Patient identified, Emergency Drugs available, Suction available, Patient being monitored and Timeout performed Patient Re-evaluated:Patient Re-evaluated prior to inductionOxygen Delivery Method: Circle system utilized Preoxygenation: Pre-oxygenation with 100% oxygen Intubation Type: IV induction Ventilation: Mask ventilation without difficulty Laryngoscope Size: Mac and 3 Grade View: Grade I Tube size: 7.0 mm Number of attempts: 1 Airway Equipment and Method: Stylet Secured at: 22 cm Tube secured with: Tape Dental Injury: Teeth and Oropharynx as per pre-operative assessment

## 2015-05-06 DIAGNOSIS — I340 Nonrheumatic mitral (valve) insufficiency: Secondary | ICD-10-CM | POA: Diagnosis not present

## 2015-05-06 DIAGNOSIS — I350 Nonrheumatic aortic (valve) stenosis: Secondary | ICD-10-CM | POA: Diagnosis not present

## 2015-05-06 DIAGNOSIS — I10 Essential (primary) hypertension: Secondary | ICD-10-CM | POA: Diagnosis not present

## 2015-05-06 DIAGNOSIS — E042 Nontoxic multinodular goiter: Secondary | ICD-10-CM | POA: Diagnosis not present

## 2015-05-06 DIAGNOSIS — K219 Gastro-esophageal reflux disease without esophagitis: Secondary | ICD-10-CM | POA: Diagnosis not present

## 2015-05-06 DIAGNOSIS — D34 Benign neoplasm of thyroid gland: Secondary | ICD-10-CM | POA: Diagnosis not present

## 2015-05-06 MED ORDER — HYDROCODONE-ACETAMINOPHEN 5-325 MG PO TABS: 1.0000 | ORAL_TABLET | ORAL | Status: DC | PRN

## 2015-05-06 NOTE — Progress Notes (Signed)
AVS given. IV removed. Paper prescription given to patient. Transportation provided by daughter. Belongings packed. Understanding of instructions demonstrated verbally.

## 2015-05-06 NOTE — Discharge Summary (Signed)
Physician Discharge Summary [facility]  Patient ID: [name] MRN: [id number] DOB/AGE: October 07, 1927 81 y.o.  Admit date: 05/05/2015 Discharge date: 05/06/2015  Admission Diagnoses:  Thyroid neoplasm of uncertain behavior  Discharge Diagnoses:  Principal Problem:   Neoplasm of uncertain behavior of thyroid gland, right lobe   Discharged Condition: good  Hospital Course: Patient was admitted for observation following thyroid surgery.  Post op course was uncomplicated.  Pain was well controlled.  Tolerated diet.  Patient was prepared for discharge home on POD#1.  Consults: None  Treatments: surgery: right thyroid lobectomy  Discharge Exam: Blood pressure 140/54, pulse 51, temperature 97.8 F (36.6 C), temperature source Oral, resp. rate 16, height 5\' 5"  (1.651 m), weight 77.11 kg (170 lb), SpO2 94 %. HEENT - clear Neck - wound dry and intact; mild STS; voice normal Chest - clear bilaterally Cor - RRR   Disposition: Home  Discharge Instructions    Apply dressing    Complete by:  As directed   Apply light gauze dressing to wound before discharge home today.     Diet - low sodium heart healthy    Complete by:  As directed      Discharge instructions    Complete by:  As directed   [facility]  THYROID & PARATHYROID SURGERY:  POST-OP INSTRUCTIONS  Always review your discharge instruction sheet from the facility where your surgery was performed.  A prescription for pain medication may be given to you upon discharge.  Take your pain medication as prescribed.  If narcotic pain medicine is not needed, then you may take acetaminophen (Tylenol) or ibuprofen (Advil) as needed.  Take your usually prescribed medications unless otherwise directed.  If you need a refill on your pain medication, please contact your pharmacy. They will contact our office to request authorization.  Prescriptions will not be processed by our office after 5 pm or  on weekends.  Start with a light diet upon arrival home, such as soup and crackers or toast.  Be sure to drink plenty of fluids daily.  Resume your normal diet the day after surgery.  Most patients will experience some swelling and bruising on the chest and neck area.  Ice packs will help.  Swelling and bruising can take several days to resolve.   It is common to experience some constipation after surgery.  Increasing fluid intake and taking a stool softener will usually help or prevent this problem.  A mild laxative (Milk of Magnesia or Miralax) should be taken according to package directions if there has been no bowel movement after 48 hours.  You have steri-strips and a gauze dressing over your incision.  You may remove the gauze bandage on the second day after surgery, and you may shower at that time.  Leave your steri-strips (small skin tapes) in place directly over the incision.  These strips should remain on the skin for 5-7 days and then be removed.  You may get them wet in the shower and pat them dry.  You may resume regular (light) daily activities beginning the next day - such as daily self-care, walking, climbing stairs - gradually increasing activities as tolerated.  You may have sexual intercourse when it is comfortable.  Refrain from any heavy lifting or straining until approved by your doctor.  You may drive when you no longer are taking prescription pain medication, you can comfortably wear a seatbelt, and you can safely maneuver your car and apply brakes.  You should see your doctor in the office for a follow-up appointment approximately two to three weeks after your surgery.  Make sure that you call for this appointment within a day or two after you arrive home to insure a convenient appointment time.  WHEN TO CALL YOUR DOCTOR: -- Fever greater than 101.5 -- Inability to urinate -- Nausea and/or vomiting - persistent -- Extreme swelling or bruising -- Continued bleeding from  incision -- Increased pain, redness, or drainage from the incision -- Difficulty swallowing or breathing -- Muscle cramping or spasms -- Numbness or tingling in hands or around lips  The clinic staff is available to answer your questions during regular business hours.  Please don't hesitate to call and ask to speak to one of the nurses if you have concerns.  Earnstine Regal, MD, [facility] Office: [phone]  Website: [email or web address]     Increase activity slowly    Complete by:  As directed      Remove dressing in 24 hours    Complete by:  As directed             Medication List    TAKE these medications        aspirin EC 81 MG tablet  Take 81 mg by mouth daily.     DIOVAN 80 MG tablet  Generic drug:  valsartan  Take 80 mg by mouth every morning.     dutasteride 0.5 MG capsule  Commonly known as:  AVODART  Take 0.5 mg by mouth daily.     escitalopram 20 MG tablet  Commonly known as:  LEXAPRO  Take 20 mg by mouth daily.     folic acid 1 MG tablet  Commonly known as:  FOLVITE  Take 1 mg by mouth daily.     hydrochlorothiazide 12.5 MG capsule  Commonly known as:  MICROZIDE  Take 12.5 mg by mouth daily.     HYDROcodone-acetaminophen 5-325 MG tablet  Commonly known as:  NORCO/VICODIN  Take 1-2 tablets by mouth every 4 (four) hours as needed for moderate pain.     ibuprofen 800 MG tablet  Commonly known as:  ADVIL,MOTRIN  Take 800 mg by mouth every 8 (eight) hours as needed for moderate pain.     methotrexate 2.5 MG tablet  Commonly known as:  RHEUMATREX  Take 22.5 mg by mouth once a week. On Monday     omeprazole 20 MG capsule  Commonly known as:  PRILOSEC  Take 1 capsule (20 mg total) by mouth daily.     simvastatin 20 MG tablet  Commonly known as:  ZOCOR  TAKE 1 TABLET BY MOUTH EVERY NIGHT AT BEDTIME         Earnstine Regal, MD, [facility] Office:  [phone]   Signed: Earnstine Regal 05/06/2015, 8:11 AM

## 2015-05-10 NOTE — Progress Notes (Signed)
Quick Note:  Please contact patient and notify of benign pathology results.  Cleone Hulick M. Aayansh Codispoti, MD, FACS [facility] Office: [phone]   ______ 

## 2015-05-11 ENCOUNTER — Encounter (HOSPITAL_COMMUNITY): Payer: Self-pay | Admitting: Surgery

## 2015-06-02 DIAGNOSIS — D440 Neoplasm of uncertain behavior of thyroid gland: Secondary | ICD-10-CM | POA: Diagnosis not present

## 2015-06-10 DIAGNOSIS — H9201 Otalgia, right ear: Secondary | ICD-10-CM | POA: Diagnosis not present

## 2015-06-10 DIAGNOSIS — I10 Essential (primary) hypertension: Secondary | ICD-10-CM | POA: Diagnosis not present

## 2015-07-29 DIAGNOSIS — L259 Unspecified contact dermatitis, unspecified cause: Secondary | ICD-10-CM | POA: Diagnosis not present

## 2015-07-31 DIAGNOSIS — R21 Rash and other nonspecific skin eruption: Secondary | ICD-10-CM | POA: Diagnosis not present

## 2015-08-04 DIAGNOSIS — R0989 Other specified symptoms and signs involving the circulatory and respiratory systems: Secondary | ICD-10-CM | POA: Diagnosis not present

## 2015-08-04 DIAGNOSIS — R509 Fever, unspecified: Secondary | ICD-10-CM | POA: Diagnosis not present

## 2015-08-04 DIAGNOSIS — R05 Cough: Secondary | ICD-10-CM | POA: Diagnosis not present

## 2015-08-04 DIAGNOSIS — R112 Nausea with vomiting, unspecified: Secondary | ICD-10-CM | POA: Diagnosis not present

## 2015-08-21 DIAGNOSIS — I10 Essential (primary) hypertension: Secondary | ICD-10-CM | POA: Diagnosis not present

## 2015-08-21 DIAGNOSIS — R05 Cough: Secondary | ICD-10-CM | POA: Diagnosis not present

## 2015-09-17 ENCOUNTER — Other Ambulatory Visit: Payer: Self-pay | Admitting: Geriatric Medicine

## 2015-09-17 ENCOUNTER — Ambulatory Visit
Admission: RE | Admit: 2015-09-17 | Discharge: 2015-09-17 | Disposition: A | Discharge status: Home / Self Care | Payer: Medicare Other | Source: Ambulatory Visit | Attending: Geriatric Medicine | Admitting: Geriatric Medicine

## 2015-09-17 DIAGNOSIS — I10 Essential (primary) hypertension: Secondary | ICD-10-CM | POA: Diagnosis not present

## 2015-09-17 DIAGNOSIS — N200 Calculus of kidney: Secondary | ICD-10-CM

## 2015-09-17 DIAGNOSIS — M545 Low back pain: Secondary | ICD-10-CM | POA: Diagnosis not present

## 2015-09-17 DIAGNOSIS — M069 Rheumatoid arthritis, unspecified: Secondary | ICD-10-CM | POA: Diagnosis not present

## 2015-11-18 DIAGNOSIS — M0609 Rheumatoid arthritis without rheumatoid factor, multiple sites: Secondary | ICD-10-CM | POA: Diagnosis not present

## 2015-11-18 DIAGNOSIS — Z79899 Other long term (current) drug therapy: Secondary | ICD-10-CM | POA: Diagnosis not present

## 2015-11-18 DIAGNOSIS — M2550 Pain in unspecified joint: Secondary | ICD-10-CM | POA: Diagnosis not present

## 2016-01-19 ENCOUNTER — Other Ambulatory Visit: Payer: Self-pay | Admitting: Geriatric Medicine

## 2016-01-19 DIAGNOSIS — E7800 Pure hypercholesterolemia, unspecified: Secondary | ICD-10-CM | POA: Diagnosis not present

## 2016-01-19 DIAGNOSIS — Z79899 Other long term (current) drug therapy: Secondary | ICD-10-CM | POA: Diagnosis not present

## 2016-01-19 DIAGNOSIS — G8929 Other chronic pain: Secondary | ICD-10-CM | POA: Diagnosis not present

## 2016-01-19 DIAGNOSIS — Q253 Supravalvular aortic stenosis: Secondary | ICD-10-CM | POA: Diagnosis not present

## 2016-01-19 DIAGNOSIS — M25511 Pain in right shoulder: Secondary | ICD-10-CM | POA: Diagnosis not present

## 2016-01-19 DIAGNOSIS — E041 Nontoxic single thyroid nodule: Secondary | ICD-10-CM | POA: Diagnosis not present

## 2016-01-19 DIAGNOSIS — M069 Rheumatoid arthritis, unspecified: Secondary | ICD-10-CM | POA: Diagnosis not present

## 2016-01-19 DIAGNOSIS — F325 Major depressive disorder, single episode, in full remission: Secondary | ICD-10-CM | POA: Diagnosis not present

## 2016-01-19 DIAGNOSIS — I10 Essential (primary) hypertension: Secondary | ICD-10-CM | POA: Diagnosis not present

## 2016-01-19 DIAGNOSIS — Z0000 Encounter for general adult medical examination without abnormal findings: Secondary | ICD-10-CM | POA: Diagnosis not present

## 2016-01-19 DIAGNOSIS — I350 Nonrheumatic aortic (valve) stenosis: Secondary | ICD-10-CM

## 2016-02-16 ENCOUNTER — Ambulatory Visit (HOSPITAL_COMMUNITY): Payer: Medicare Other | Attending: Cardiovascular Disease

## 2016-02-16 ENCOUNTER — Other Ambulatory Visit: Payer: Self-pay

## 2016-02-16 DIAGNOSIS — I059 Rheumatic mitral valve disease, unspecified: Secondary | ICD-10-CM | POA: Insufficient documentation

## 2016-02-16 DIAGNOSIS — I350 Nonrheumatic aortic (valve) stenosis: Secondary | ICD-10-CM | POA: Diagnosis not present

## 2016-02-16 DIAGNOSIS — I352 Nonrheumatic aortic (valve) stenosis with insufficiency: Secondary | ICD-10-CM | POA: Diagnosis not present

## 2016-02-16 DIAGNOSIS — I119 Hypertensive heart disease without heart failure: Secondary | ICD-10-CM | POA: Diagnosis not present

## 2016-02-16 LAB — ECHOCARDIOGRAM COMPLETE
AV Area mean vel: 1.03 cm2
AV pk vel: 412 cm/s
AVA: 1.08 cm2
AVAREAMEANVIN: 0.56 cm2/m2
AVAREAVTI: 0.98 cm2
AVAREAVTIIND: 0.58 cm2/m2
AVCELMEANRAT: 0.3
AVG: 36 mmHg
AVPG: 68 mmHg
Ao pk vel: 0.28 m/s
Ao-asc: 35 cm
CHL CUP AV PEAK INDEX: 0.53
CHL CUP AV VALUE AREA INDEX: 0.58
CHL CUP AV VEL: 1.08
CHL CUP DOP CALC LVOT VTI: 29.1 cm
CHL CUP MV DEC (S): 338
CHL CUP STROKE VOLUME: 44 mL
CHL CUP TV REG PEAK VELOCITY: 259 cm/s
DOP CAL AO MEAN VELOCITY: 279 cm/s
EERAT: 13.71
EWDT: 338 ms
FS: 42 % (ref 28–44)
IV/PV OW: 1.09
LA diam end sys: 37 mm
LA diam index: 2 cm/m2
LA vol A4C: 54 ml
LA vol: 61 mL
LASIZE: 37 mm
LAVOLIN: 33 mL/m2
LDCA: 3.46 cm2
LV e' LATERAL: 6.73 cm/s
LV sys vol index: 13 mL/m2
LV sys vol: 24 mL (ref 21–61)
LVDIAVOL: 68 mL (ref 62–150)
LVDIAVOLIN: 37 mL/m2
LVEEAVG: 13.71
LVEEMED: 13.71
LVOT peak VTI: 0.31 cm
LVOT peak grad rest: 5 mmHg
LVOT peak vel: 117 cm/s
LVOTD: 21 mm
LVOTSV: 101 mL
MV Peak grad: 3 mmHg
MV pk A vel: 136 m/s
MVPKEVEL: 92.3 m/s
PW: 11.5 mm — AB (ref 0.6–1.1)
RV LATERAL S' VELOCITY: 13 cm/s
RV sys press: 30 mmHg
Simpson's disk: 65
TDI e' lateral: 6.73
TDI e' medial: 5.26
TR max vel: 259 cm/s
VTI: 93.2 cm

## 2016-02-19 DIAGNOSIS — Z79899 Other long term (current) drug therapy: Secondary | ICD-10-CM | POA: Diagnosis not present

## 2016-02-19 DIAGNOSIS — M2550 Pain in unspecified joint: Secondary | ICD-10-CM | POA: Diagnosis not present

## 2016-02-19 DIAGNOSIS — M0609 Rheumatoid arthritis without rheumatoid factor, multiple sites: Secondary | ICD-10-CM | POA: Diagnosis not present

## 2016-03-21 DIAGNOSIS — Z23 Encounter for immunization: Secondary | ICD-10-CM | POA: Diagnosis not present

## 2016-04-05 ENCOUNTER — Other Ambulatory Visit: Payer: Self-pay | Admitting: Cardiology

## 2016-04-30 ENCOUNTER — Other Ambulatory Visit: Payer: Self-pay | Admitting: Cardiology

## 2016-05-24 DIAGNOSIS — Z79899 Other long term (current) drug therapy: Secondary | ICD-10-CM | POA: Diagnosis not present

## 2016-05-24 DIAGNOSIS — M0609 Rheumatoid arthritis without rheumatoid factor, multiple sites: Secondary | ICD-10-CM | POA: Diagnosis not present

## 2016-06-01 ENCOUNTER — Other Ambulatory Visit: Payer: Self-pay | Admitting: Cardiology

## 2016-06-14 ENCOUNTER — Other Ambulatory Visit: Payer: Self-pay | Admitting: Cardiology

## 2016-07-10 ENCOUNTER — Other Ambulatory Visit: Payer: Self-pay | Admitting: Cardiology

## 2016-07-12 NOTE — Telephone Encounter (Signed)
Patient has not been seen since 02/27/15 and the last several rx's for this have only been for a quantity of fifteen with a note to call and schedule. Patient has failed to do so. Should this be deferred to pcp? Please advise. Thanks, MI

## 2016-08-13 ENCOUNTER — Emergency Department (HOSPITAL_COMMUNITY): Payer: Medicare Other

## 2016-08-13 ENCOUNTER — Encounter (HOSPITAL_COMMUNITY): Payer: Self-pay | Admitting: Emergency Medicine

## 2016-08-13 ENCOUNTER — Inpatient Hospital Stay (HOSPITAL_COMMUNITY)
Admission: EM | Admit: 2016-08-13 | Discharge: 2016-08-28 | DRG: 199 | Disposition: A | Discharge status: Rehabilitation Facility | Payer: Medicare Other | Attending: General Surgery | Admitting: General Surgery

## 2016-08-13 DIAGNOSIS — R143 Flatulence: Secondary | ICD-10-CM | POA: Diagnosis not present

## 2016-08-13 DIAGNOSIS — S0990XA Unspecified injury of head, initial encounter: Secondary | ICD-10-CM | POA: Diagnosis not present

## 2016-08-13 DIAGNOSIS — R0600 Dyspnea, unspecified: Secondary | ICD-10-CM

## 2016-08-13 DIAGNOSIS — R140 Abdominal distension (gaseous): Secondary | ICD-10-CM | POA: Diagnosis not present

## 2016-08-13 DIAGNOSIS — S2243XA Multiple fractures of ribs, bilateral, initial encounter for closed fracture: Secondary | ICD-10-CM | POA: Diagnosis not present

## 2016-08-13 DIAGNOSIS — S2241XA Multiple fractures of ribs, right side, initial encounter for closed fracture: Secondary | ICD-10-CM | POA: Diagnosis present

## 2016-08-13 DIAGNOSIS — R079 Chest pain, unspecified: Secondary | ICD-10-CM

## 2016-08-13 DIAGNOSIS — I350 Nonrheumatic aortic (valve) stenosis: Secondary | ICD-10-CM | POA: Diagnosis present

## 2016-08-13 DIAGNOSIS — D62 Acute posthemorrhagic anemia: Secondary | ICD-10-CM | POA: Diagnosis not present

## 2016-08-13 DIAGNOSIS — I248 Other forms of acute ischemic heart disease: Secondary | ICD-10-CM | POA: Diagnosis present

## 2016-08-13 DIAGNOSIS — S270XXD Traumatic pneumothorax, subsequent encounter: Secondary | ICD-10-CM | POA: Diagnosis not present

## 2016-08-13 DIAGNOSIS — F419 Anxiety disorder, unspecified: Secondary | ICD-10-CM | POA: Diagnosis present

## 2016-08-13 DIAGNOSIS — S22079A Unspecified fracture of T9-T10 vertebra, initial encounter for closed fracture: Secondary | ICD-10-CM | POA: Diagnosis present

## 2016-08-13 DIAGNOSIS — K560 Paralytic ileus: Secondary | ICD-10-CM

## 2016-08-13 DIAGNOSIS — E785 Hyperlipidemia, unspecified: Secondary | ICD-10-CM | POA: Diagnosis present

## 2016-08-13 DIAGNOSIS — S3993XA Unspecified injury of pelvis, initial encounter: Secondary | ICD-10-CM | POA: Diagnosis not present

## 2016-08-13 DIAGNOSIS — M549 Dorsalgia, unspecified: Secondary | ICD-10-CM

## 2016-08-13 DIAGNOSIS — N400 Enlarged prostate without lower urinary tract symptoms: Secondary | ICD-10-CM | POA: Diagnosis present

## 2016-08-13 DIAGNOSIS — F329 Major depressive disorder, single episode, unspecified: Secondary | ICD-10-CM | POA: Diagnosis present

## 2016-08-13 DIAGNOSIS — S270XXA Traumatic pneumothorax, initial encounter: Principal | ICD-10-CM | POA: Diagnosis present

## 2016-08-13 DIAGNOSIS — S22079S Unspecified fracture of T9-T10 vertebra, sequela: Secondary | ICD-10-CM | POA: Diagnosis not present

## 2016-08-13 DIAGNOSIS — K5900 Constipation, unspecified: Secondary | ICD-10-CM | POA: Diagnosis not present

## 2016-08-13 DIAGNOSIS — Z961 Presence of intraocular lens: Secondary | ICD-10-CM | POA: Diagnosis present

## 2016-08-13 DIAGNOSIS — S22089A Unspecified fracture of T11-T12 vertebra, initial encounter for closed fracture: Secondary | ICD-10-CM | POA: Diagnosis not present

## 2016-08-13 DIAGNOSIS — R110 Nausea: Secondary | ICD-10-CM

## 2016-08-13 DIAGNOSIS — Z9842 Cataract extraction status, left eye: Secondary | ICD-10-CM | POA: Diagnosis not present

## 2016-08-13 DIAGNOSIS — K567 Ileus, unspecified: Secondary | ICD-10-CM

## 2016-08-13 DIAGNOSIS — I10 Essential (primary) hypertension: Secondary | ICD-10-CM | POA: Diagnosis present

## 2016-08-13 DIAGNOSIS — S279XXA Injury of unspecified intrathoracic organ, initial encounter: Secondary | ICD-10-CM | POA: Diagnosis not present

## 2016-08-13 DIAGNOSIS — R40241 Glasgow coma scale score 13-15, unspecified time: Secondary | ICD-10-CM | POA: Diagnosis not present

## 2016-08-13 DIAGNOSIS — J90 Pleural effusion, not elsewhere classified: Secondary | ICD-10-CM | POA: Diagnosis not present

## 2016-08-13 DIAGNOSIS — M069 Rheumatoid arthritis, unspecified: Secondary | ICD-10-CM | POA: Diagnosis present

## 2016-08-13 DIAGNOSIS — S199XXA Unspecified injury of neck, initial encounter: Secondary | ICD-10-CM | POA: Diagnosis not present

## 2016-08-13 DIAGNOSIS — R0789 Other chest pain: Secondary | ICD-10-CM | POA: Diagnosis not present

## 2016-08-13 DIAGNOSIS — W14XXXA Fall from tree, initial encounter: Secondary | ICD-10-CM

## 2016-08-13 DIAGNOSIS — W1789XA Other fall from one level to another, initial encounter: Secondary | ICD-10-CM | POA: Diagnosis present

## 2016-08-13 DIAGNOSIS — S299XXA Unspecified injury of thorax, initial encounter: Secondary | ICD-10-CM | POA: Diagnosis not present

## 2016-08-13 DIAGNOSIS — K668 Other specified disorders of peritoneum: Secondary | ICD-10-CM | POA: Diagnosis not present

## 2016-08-13 DIAGNOSIS — K661 Hemoperitoneum: Secondary | ICD-10-CM

## 2016-08-13 DIAGNOSIS — J939 Pneumothorax, unspecified: Secondary | ICD-10-CM | POA: Diagnosis not present

## 2016-08-13 DIAGNOSIS — R0602 Shortness of breath: Secondary | ICD-10-CM

## 2016-08-13 DIAGNOSIS — E876 Hypokalemia: Secondary | ICD-10-CM | POA: Diagnosis not present

## 2016-08-13 DIAGNOSIS — M7989 Other specified soft tissue disorders: Secondary | ICD-10-CM | POA: Diagnosis not present

## 2016-08-13 DIAGNOSIS — Z7982 Long term (current) use of aspirin: Secondary | ICD-10-CM

## 2016-08-13 DIAGNOSIS — R1310 Dysphagia, unspecified: Secondary | ICD-10-CM | POA: Diagnosis not present

## 2016-08-13 DIAGNOSIS — Z87891 Personal history of nicotine dependence: Secondary | ICD-10-CM

## 2016-08-13 DIAGNOSIS — B370 Candidal stomatitis: Secondary | ICD-10-CM | POA: Diagnosis not present

## 2016-08-13 DIAGNOSIS — R0682 Tachypnea, not elsewhere classified: Secondary | ICD-10-CM

## 2016-08-13 DIAGNOSIS — T797XXA Traumatic subcutaneous emphysema, initial encounter: Secondary | ICD-10-CM | POA: Diagnosis present

## 2016-08-13 DIAGNOSIS — H9190 Unspecified hearing loss, unspecified ear: Secondary | ICD-10-CM | POA: Diagnosis present

## 2016-08-13 DIAGNOSIS — R1314 Dysphagia, pharyngoesophageal phase: Secondary | ICD-10-CM | POA: Diagnosis present

## 2016-08-13 DIAGNOSIS — S3991XA Unspecified injury of abdomen, initial encounter: Secondary | ICD-10-CM | POA: Diagnosis not present

## 2016-08-13 DIAGNOSIS — D500 Iron deficiency anemia secondary to blood loss (chronic): Secondary | ICD-10-CM | POA: Diagnosis not present

## 2016-08-13 DIAGNOSIS — K219 Gastro-esophageal reflux disease without esophagitis: Secondary | ICD-10-CM | POA: Diagnosis present

## 2016-08-13 DIAGNOSIS — N179 Acute kidney failure, unspecified: Secondary | ICD-10-CM | POA: Diagnosis not present

## 2016-08-13 DIAGNOSIS — R52 Pain, unspecified: Secondary | ICD-10-CM | POA: Diagnosis not present

## 2016-08-13 DIAGNOSIS — Z9841 Cataract extraction status, right eye: Secondary | ICD-10-CM | POA: Diagnosis not present

## 2016-08-13 DIAGNOSIS — I5031 Acute diastolic (congestive) heart failure: Secondary | ICD-10-CM

## 2016-08-13 DIAGNOSIS — K4090 Unilateral inguinal hernia, without obstruction or gangrene, not specified as recurrent: Secondary | ICD-10-CM | POA: Diagnosis present

## 2016-08-13 DIAGNOSIS — Z79899 Other long term (current) drug therapy: Secondary | ICD-10-CM

## 2016-08-13 DIAGNOSIS — K5730 Diverticulosis of large intestine without perforation or abscess without bleeding: Secondary | ICD-10-CM | POA: Diagnosis present

## 2016-08-13 DIAGNOSIS — S22080A Wedge compression fracture of T11-T12 vertebra, initial encounter for closed fracture: Secondary | ICD-10-CM | POA: Diagnosis not present

## 2016-08-13 DIAGNOSIS — R109 Unspecified abdominal pain: Secondary | ICD-10-CM | POA: Diagnosis not present

## 2016-08-13 DIAGNOSIS — K5901 Slow transit constipation: Secondary | ICD-10-CM | POA: Diagnosis not present

## 2016-08-13 DIAGNOSIS — D649 Anemia, unspecified: Secondary | ICD-10-CM | POA: Diagnosis not present

## 2016-08-13 DIAGNOSIS — R451 Restlessness and agitation: Secondary | ICD-10-CM | POA: Diagnosis not present

## 2016-08-13 DIAGNOSIS — Z5189 Encounter for other specified aftercare: Secondary | ICD-10-CM | POA: Diagnosis not present

## 2016-08-13 DIAGNOSIS — Z86718 Personal history of other venous thrombosis and embolism: Secondary | ICD-10-CM | POA: Diagnosis not present

## 2016-08-13 DIAGNOSIS — S22009A Unspecified fracture of unspecified thoracic vertebra, initial encounter for closed fracture: Secondary | ICD-10-CM

## 2016-08-13 DIAGNOSIS — S22070A Wedge compression fracture of T9-T10 vertebra, initial encounter for closed fracture: Secondary | ICD-10-CM | POA: Diagnosis not present

## 2016-08-13 DIAGNOSIS — S22039S Unspecified fracture of third thoracic vertebra, sequela: Secondary | ICD-10-CM | POA: Diagnosis not present

## 2016-08-13 LAB — CBC
HEMATOCRIT: 36.1 % — AB (ref 39.0–52.0)
Hemoglobin: 11.9 g/dL — ABNORMAL LOW (ref 13.0–17.0)
MCH: 29.7 pg (ref 26.0–34.0)
MCHC: 33 g/dL (ref 30.0–36.0)
MCV: 90 fL (ref 78.0–100.0)
PLATELETS: 156 10*3/uL (ref 150–400)
RBC: 4.01 MIL/uL — ABNORMAL LOW (ref 4.22–5.81)
RDW: 13.1 % (ref 11.5–15.5)
WBC: 13.1 10*3/uL — AB (ref 4.0–10.5)

## 2016-08-13 LAB — URINALYSIS, ROUTINE W REFLEX MICROSCOPIC
Bacteria, UA: NONE SEEN
Bilirubin Urine: NEGATIVE
GLUCOSE, UA: NEGATIVE mg/dL
Ketones, ur: 20 mg/dL — AB
Leukocytes, UA: NEGATIVE
NITRITE: NEGATIVE
Protein, ur: NEGATIVE mg/dL
RBC / HPF: NONE SEEN RBC/hpf (ref 0–5)
SPECIFIC GRAVITY, URINE: 1.038 — AB (ref 1.005–1.030)
Squamous Epithelial / LPF: NONE SEEN
pH: 5 (ref 5.0–8.0)

## 2016-08-13 LAB — I-STAT CHEM 8, ED
BUN: 25 mg/dL — AB (ref 6–20)
CREATININE: 1.2 mg/dL (ref 0.61–1.24)
Calcium, Ion: 1.09 mmol/L — ABNORMAL LOW (ref 1.15–1.40)
Chloride: 102 mmol/L (ref 101–111)
GLUCOSE: 120 mg/dL — AB (ref 65–99)
HEMATOCRIT: 38 % — AB (ref 39.0–52.0)
Hemoglobin: 12.9 g/dL — ABNORMAL LOW (ref 13.0–17.0)
Potassium: 4 mmol/L (ref 3.5–5.1)
Sodium: 138 mmol/L (ref 135–145)
TCO2: 28 mmol/L (ref 0–100)

## 2016-08-13 LAB — COMPREHENSIVE METABOLIC PANEL
ALT: 42 U/L (ref 17–63)
AST: 88 U/L — AB (ref 15–41)
Albumin: 3.9 g/dL (ref 3.5–5.0)
Alkaline Phosphatase: 47 U/L (ref 38–126)
Anion gap: 10 (ref 5–15)
BILIRUBIN TOTAL: 1.2 mg/dL (ref 0.3–1.2)
BUN: 18 mg/dL (ref 6–20)
CO2: 23 mmol/L (ref 22–32)
CREATININE: 1.18 mg/dL (ref 0.61–1.24)
Calcium: 8.9 mg/dL (ref 8.9–10.3)
Chloride: 102 mmol/L (ref 101–111)
GFR calc Af Amer: >60 mL/min (ref >60)
GFR, EST NON AFRICAN AMERICAN: 54 mL/min — AB (ref >60)
Glucose, Bld: 120 mg/dL — ABNORMAL HIGH (ref 65–99)
POTASSIUM: 4 mmol/L (ref 3.5–5.1)
Sodium: 135 mmol/L (ref 135–145)
TOTAL PROTEIN: 6.7 g/dL (ref 6.5–8.1)

## 2016-08-13 LAB — SAMPLE TO BLOOD BANK

## 2016-08-13 LAB — PROTIME-INR
INR: 1.12
Prothrombin Time: 14.5 seconds (ref 11.4–15.2)

## 2016-08-13 LAB — I-STAT CG4 LACTIC ACID, ED: Lactic Acid, Venous: 1.73 mmol/L (ref 0.5–1.9)

## 2016-08-13 LAB — ETHANOL: Alcohol, Ethyl (B): <5 mg/dL (ref <5)

## 2016-08-13 MED ORDER — DUTASTERIDE 0.5 MG PO CAPS: 0.5000 mg | ORAL_CAPSULE | Freq: Every day | ORAL | Status: DC

## 2016-08-13 MED ORDER — ESCITALOPRAM OXALATE 10 MG PO TABS
20.0000 mg | ORAL_TABLET | Freq: Every day | ORAL | Status: DC
  Administered 2016-08-14 – 2016-08-28 (×15): 20 mg via ORAL
  Filled 2016-08-13: qty 2
  Filled 2016-08-13: qty 1
  Filled 2016-08-13 (×3): qty 2
  Filled 2016-08-13: qty 1
  Filled 2016-08-13 (×9): qty 2

## 2016-08-13 MED ORDER — ONDANSETRON HCL 4 MG/2ML IJ SOLN
4.0000 mg | Freq: Four times a day (QID) | INTRAMUSCULAR | Status: DC | PRN
  Administered 2016-08-20: 4 mg via INTRAVENOUS
  Filled 2016-08-13: qty 2

## 2016-08-13 MED ORDER — ENOXAPARIN SODIUM 40 MG/0.4ML ~~LOC~~ SOLN
40.0000 mg | SUBCUTANEOUS | Status: DC
  Administered 2016-08-14 – 2016-08-21 (×8): 40 mg via SUBCUTANEOUS
  Filled 2016-08-13 (×8): qty 0.4

## 2016-08-13 MED ORDER — SIMVASTATIN 20 MG PO TABS
20.0000 mg | ORAL_TABLET | Freq: Every day | ORAL | Status: DC
  Administered 2016-08-13 – 2016-08-27 (×11): 20 mg via ORAL
  Filled 2016-08-13 (×13): qty 1

## 2016-08-13 MED ORDER — ONDANSETRON HCL 4 MG PO TABS: 4.0000 mg | ORAL_TABLET | Freq: Four times a day (QID) | ORAL | Status: DC | PRN

## 2016-08-13 MED ORDER — PANTOPRAZOLE SODIUM 40 MG PO TBEC
40.0000 mg | DELAYED_RELEASE_TABLET | Freq: Every day | ORAL | Status: DC
Start: 2016-08-13 — End: 2016-08-28
  Administered 2016-08-14 – 2016-08-28 (×15): 40 mg via ORAL
  Filled 2016-08-13 (×15): qty 1

## 2016-08-13 MED ORDER — TRAMADOL HCL 50 MG PO TABS
50.0000 mg | ORAL_TABLET | Freq: Four times a day (QID) | ORAL | Status: DC | PRN
  Administered 2016-08-13 – 2016-08-14 (×3): 50 mg via ORAL
  Filled 2016-08-13 (×5): qty 1

## 2016-08-13 MED ORDER — ASPIRIN EC 81 MG PO TBEC
81.0000 mg | DELAYED_RELEASE_TABLET | Freq: Every day | ORAL | Status: DC
  Administered 2016-08-14 – 2016-08-28 (×15): 81 mg via ORAL
  Filled 2016-08-13 (×15): qty 1

## 2016-08-13 MED ORDER — SODIUM CHLORIDE 0.9 % IV SOLN
INTRAVENOUS | Status: DC
  Administered 2016-08-14: 1000 mL via INTRAVENOUS

## 2016-08-13 MED ORDER — FENTANYL CITRATE (PF) 100 MCG/2ML IJ SOLN
INTRAMUSCULAR | Status: AC
  Filled 2016-08-13: qty 2

## 2016-08-13 MED ORDER — IOPAMIDOL (ISOVUE-300) INJECTION 61%
INTRAVENOUS | Status: AC
  Administered 2016-08-13: 75 mL
  Filled 2016-08-13: qty 75

## 2016-08-13 MED ORDER — MORPHINE SULFATE (PF) 2 MG/ML IV SOLN
1.0000 mg | INTRAVENOUS | Status: DC | PRN
  Administered 2016-08-13 – 2016-08-16 (×8): 1 mg via INTRAVENOUS
  Filled 2016-08-13 (×8): qty 1

## 2016-08-13 MED ORDER — SODIUM CHLORIDE 0.9 % IV BOLUS (SEPSIS)
1000.0000 mL | Freq: Once | INTRAVENOUS | Status: AC
  Administered 2016-08-13: 1000 mL via INTRAVENOUS

## 2016-08-13 MED ORDER — IBUPROFEN 800 MG PO TABS
800.0000 mg | ORAL_TABLET | Freq: Three times a day (TID) | ORAL | Status: DC | PRN
  Administered 2016-08-13 – 2016-08-28 (×6): 800 mg via ORAL
  Filled 2016-08-13 (×3): qty 1
  Filled 2016-08-13: qty 4
  Filled 2016-08-13 (×5): qty 1

## 2016-08-13 MED ORDER — HYDROCHLOROTHIAZIDE 12.5 MG PO CAPS
12.5000 mg | ORAL_CAPSULE | Freq: Every day | ORAL | Status: DC
  Administered 2016-08-14 – 2016-08-28 (×15): 12.5 mg via ORAL
  Filled 2016-08-13 (×15): qty 1

## 2016-08-13 MED ORDER — ACETAMINOPHEN 325 MG PO TABS: 650.0000 mg | ORAL_TABLET | ORAL | Status: DC | PRN

## 2016-08-13 MED ORDER — IRBESARTAN 75 MG PO TABS
75.0000 mg | ORAL_TABLET | Freq: Every day | ORAL | Status: DC
  Administered 2016-08-14 – 2016-08-28 (×15): 75 mg via ORAL
  Filled 2016-08-13 (×15): qty 1

## 2016-08-13 MED ORDER — FENTANYL CITRATE (PF) 100 MCG/2ML IJ SOLN
50.0000 ug | Freq: Once | INTRAMUSCULAR | Status: AC
  Administered 2016-08-13: 50 ug via INTRAVENOUS

## 2016-08-13 NOTE — Progress Notes (Signed)
Dr. Carmin Muskrat waived labs for CT scan with contrast.

## 2016-08-13 NOTE — Progress Notes (Signed)
Orthopedic Tech Progress Note Patient Details:  [name] 01/20/1928 [id number] Level 2 trauma ortho visit. Patient ID: [name], male   DOB: 1927/07/15, 83 y.o.   MRN: [id number]   [name] 08/13/2016, 3:40 PM

## 2016-08-13 NOTE — H&P (Signed)
[name] is an 83 y.o. male.   Chief Complaint: fall from tree HPI: 26yom s/p fall from tree.  Through his daughter he climbed about 25 feet then lost his stand. While trying to climb down he fell from about 3 meters.  His right side hurts.  Low back pain but ct without abnormality  Past Medical History:  Diagnosis Date  . At risk for sleep apnea    STOP-BANG= 4    SENT TO PCP 04-04-2014  . BPH (benign prostatic hypertrophy)   . Depression   . Diverticulosis of colon   . GERD (gastroesophageal reflux disease)   . Gilbert's syndrome   . Headache    migraines  . Hearing loss   . Heart murmur   . Hepatitis    Hep A as a child  . History of gastroenteritis   . History of kidney stones   . History of syncope    2002--  vasovagal  . HOH (hard of hearing)    refuses to wear his Hearing Aid  . Hypertension   . Inguinal hernia    right  . Internal hemorrhoid, bleeding   . Moderate aortic stenosis    a. Last echo 11/2013, 06/2014.  Marland Kitchen Nocturia   . Normal coronary arteries    a. Cath 07/2010: normal coronaries. Felt to have noncardiac CP/SOB at that time possibly r/t anxiety.  . RA (rheumatoid arthritis) ([place])    bilateral hands/ wrist--  seronegative  . Rotator cuff syndrome of right shoulder 08/01/2013  . Shortness of breath dyspnea    with exertion  . Stone, kidney   . Thyroid cyst   . Thyroid nodule    noted 11-2009  . Wears dentures     Past Surgical History:  Procedure Laterality Date  . CARDIAC CATHETERIZATION  07-19-2010  dr Tressia Miners turner   normal coronary arteries,  ef 60%,  moderate aortic stenosis- gradient 80mHg, normal right heart pressure  . CARDIOVASCULAR STRESS TEST  05/ 2011   dr sMarlou Porch  normal low risk perfusion study  . CATARACT EXTRACTION W/ INTRAOCULAR LENS  IMPLANT, BILATERAL  2013  . COLONOSCOPY  2010  approx  . THYROID LOBECTOMY Right 05/05/2015   Procedure: RIGHT THYROID LOBECTOMY;  Surgeon: TArmandina Gemma MD;  [facility]  Service: General;   Laterality: Right;  . TRANSANAL HEMORRHOIDAL DEARTERIALIZATION N/A 04/09/2014   Procedure: TRANSANAL HEMORRHOIDAL DEARTERIALIZATION OF INTERNAL HEMORROIDS;  Surgeon: ALeighton Ruff MD;  [facility]  Service: General;  Laterality: N/A;  . TRANSTHORACIC ECHOCARDIOGRAM  11-26-2013   moderate focal basal and mild LVH/  ef 612-87%/ grade I diastolic dysfunction/ mild LAE/  moderate calcification with stenosis AV with mild regurg,  gradients 35 abd 58 mmHg /  mild TR    Family History  Problem Relation Age of Onset  . Pulmonary embolism Mother 469   caused by complications of surgery  . CVA Father   . Diabetes Brother   . Diabetes Brother   . Breast cancer Sister    Social History:  reports that he quit smoking about 35 years ago. His smoking use included Cigarettes. He started smoking about 43 years ago. He quit after 40.00 years of use. He has never used smokeless tobacco. He reports that he drinks alcohol. He reports that he does not use drugs.  Allergies: No Known Allergies  meds reviewed  Results for orders placed or performed during the hospital encounter of 08/13/16 (from the past 48  hour(s))  Comprehensive metabolic panel     Status: Abnormal   Collection Time: 08/13/16  3:30 PM  Result Value Ref Range   Sodium 135 135 - 145 mmol/L   Potassium 4.0 3.5 - 5.1 mmol/L   Chloride 102 101 - 111 mmol/L   CO2 23 22 - 32 mmol/L   Glucose, Bld 120 (H) 65 - 99 mg/dL   BUN 18 6 - 20 mg/dL   Creatinine, Ser 1.18 0.61 - 1.24 mg/dL   Calcium 8.9 8.9 - 10.3 mg/dL   Total Protein 6.7 6.5 - 8.1 g/dL   Albumin 3.9 3.5 - 5.0 g/dL   AST 88 (H) 15 - 41 U/L   ALT 42 17 - 63 U/L   Alkaline Phosphatase 47 38 - 126 U/L   Total Bilirubin 1.2 0.3 - 1.2 mg/dL   GFR calc non Af Amer 54 (L) >60 mL/min   GFR calc Af Amer >60 >60 mL/min    Comment: (NOTE) The eGFR has been calculated using the CKD EPI equation. This calculation has not been validated in all clinical  situations. eGFR's persistently <60 mL/min signify possible Chronic Kidney Disease.    Anion gap 10 5 - 15  CBC     Status: Abnormal   Collection Time: 08/13/16  3:30 PM  Result Value Ref Range   WBC 13.1 (H) 4.0 - 10.5 K/uL   RBC 4.01 (L) 4.22 - 5.81 MIL/uL   Hemoglobin 11.9 (L) 13.0 - 17.0 g/dL   HCT 36.1 (L) 39.0 - 52.0 %   MCV 90.0 78.0 - 100.0 fL   MCH 29.7 26.0 - 34.0 pg   MCHC 33.0 30.0 - 36.0 g/dL   RDW 13.1 11.5 - 15.5 %   Platelets 156 150 - 400 K/uL  Ethanol     Status: None   Collection Time: 08/13/16  3:30 PM  Result Value Ref Range   Alcohol, Ethyl (B) <5 <5 mg/dL    Comment:        LOWEST DETECTABLE LIMIT FOR SERUM ALCOHOL IS 5 mg/dL FOR MEDICAL PURPOSES ONLY   Protime-INR     Status: None   Collection Time: 08/13/16  3:30 PM  Result Value Ref Range   Prothrombin Time 14.5 11.4 - 15.2 seconds   INR 1.12   Sample to Blood Bank     Status: None   Collection Time: 08/13/16  3:36 PM  Result Value Ref Range   Blood Bank Specimen SAMPLE AVAILABLE FOR TESTING    Sample Expiration 08/14/2016   I-Stat Chem 8, ED     Status: Abnormal   Collection Time: 08/13/16  3:43 PM  Result Value Ref Range   Sodium 138 135 - 145 mmol/L   Potassium 4.0 3.5 - 5.1 mmol/L   Chloride 102 101 - 111 mmol/L   BUN 25 (H) 6 - 20 mg/dL   Creatinine, Ser 1.20 0.61 - 1.24 mg/dL   Glucose, Bld 120 (H) 65 - 99 mg/dL   Calcium, Ion 1.09 (L) 1.15 - 1.40 mmol/L   TCO2 28 0 - 100 mmol/L   Hemoglobin 12.9 (L) 13.0 - 17.0 g/dL   HCT 38.0 (L) 39.0 - 52.0 %  I-Stat CG4 Lactic Acid, ED     Status: None   Collection Time: 08/13/16  3:43 PM  Result Value Ref Range   Lactic Acid, Venous 1.73 0.5 - 1.9 mmol/L   Ct Head Wo Contrast  Result Date: 08/13/2016 CLINICAL DATA:  Fall from 25 feet, chest pain and  lower back pain. EXAM: CT HEAD WITHOUT CONTRAST CT CERVICAL SPINE WITHOUT CONTRAST TECHNIQUE: Multidetector CT imaging of the head and cervical spine was performed following the standard protocol  without intravenous contrast. Multiplanar CT image reconstructions of the cervical spine were also generated. COMPARISON:  None. FINDINGS: CT HEAD FINDINGS Brain: There is mild generalized parenchymal atrophy with commensurate dilatation of the ventricles and sulci. Mild chronic small vessel ischemic change noted within the periventricular white matter regions bilaterally. There is no mass, hemorrhage, edema or other evidence of acute parenchymal abnormality. No extra-axial hemorrhage. Vascular: There are chronic calcified atherosclerotic changes of the large vessels at the skull base. No unexpected hyperdense vessel. Skull: Normal. Negative for fracture or focal lesion. Sinuses/Orbits: No acute finding. Other: None. CT CERVICAL SPINE FINDINGS Alignment: Alignment is normal.  No subluxation. Skull base and vertebrae: No fracture line or displaced fracture fragment identified. Facet joints appear intact and normally aligned throughout. Soft tissues and spinal canal: No prevertebral fluid or swelling. No visible canal hematoma. Disc levels: Degenerative changes throughout the cervical spine, mild to moderate in degree, most pronounced at the C6-7 level, no more than mild central canal stenosis appreciated at any level. Upper chest: Unremarkable. Atherosclerotic changes of the aortic arch. Surgical changes at the level of the right thyroid lobe. Other: None. IMPRESSION: 1. No acute intracranial abnormality. No intracranial mass, hemorrhage or edema. No skull fracture. Mild chronic small vessel ischemic change within the white matter. 2. No fracture or acute subluxation within the cervical spine. Degenerative changes of the cervical spine, as detailed above. 3. Aortic atherosclerosis. Electronically Signed   By: Franki Cabot M.D.   On: 08/13/2016 16:25   Ct Chest W Contrast  Result Date: 08/13/2016 CLINICAL DATA:  Level 2 trauma. Fell 25 feet from a tree. Unsure of loss of consciousness. Chest pain a lower back  pain. EXAM: CT CHEST, ABDOMEN, AND PELVIS WITH CONTRAST TECHNIQUE: Multidetector CT imaging of the chest, abdomen and pelvis was performed following the standard protocol during bolus administration of intravenous contrast. CONTRAST:  65m ISOVUE-300 IOPAMIDOL (ISOVUE-300) INJECTION 61% COMPARISON:  08/13/2016 plain films, CT abdomen and pelvis on 08/19/2011 FINDINGS: CT CHEST FINDINGS Cardiovascular: Heart size is normal. There is three-vessel coronary artery disease. No pericardial effusion. Aortic valve calcification. There is atherosclerotic calcification of the thoracic aorta not associated with aneurysm or dissection. Mediastinum/Nodes: Small anterior mediastinal hematoma at the level of the pericardial fat pad. Status post right thyroidectomy. No mediastinal or hilar adenopathy. Lungs/Pleura: There is a right-sided pneumothorax, estimated to be 5% of lung volume, seen primarily along the medial aspect of the lung. Right chest wall subcutaneous gas is present primarily along the anterior aspect of the chest. There is a small right pleural effusion. Minimal subsegmental atelectasis identified at the right lung base. Within the right lower lobe a pulmonary nodule measures 6 mm on image 92 of series 5. Within the left upper lobe a nodule measures 6 mm on image 50 of series 5. Musculoskeletal: Significant ecchymosis and edema of the anterior chest wall. There are fractures of at least 3 right costochondral junctions 4, 5, and 6. However the sternum is intact. Changes of diffuse idiopathic skeletal hyperostosis of the thoracic spine. No acute thoracic vertebral fractures. No acute displaced rib fractures. CT ABDOMEN PELVIS FINDINGS Hepatobiliary: Hepatic granulomata are present. The liver is homogeneous otherwise. Gallbladder is present. Pancreas: Unremarkable. No pancreatic ductal dilatation or surrounding inflammatory changes. Spleen: No splenic injury or perisplenic hematoma. Adrenals/Urinary Tract: The adrenal  glands are normal in appearance. There are bilateral renal cysts. No hydronephrosis or evidence for acute renal injury . Stomach/Bowel: The stomach is normal in appearance. The appendix is well seen and has a normal appearance. Nondilated loops of small bowel are identified within a right inguinal hernia, showing no evidence for obstruction or compromise. Extensive colonic diverticulosis, primarily involving the sigmoid colon. No associated inflammatory changes. Vascular/Lymphatic: Atherosclerotic calcification of the abdominal aorta and its branches. No retroperitoneal or mesenteric adenopathy. Reproductive: Prostate gland is prominent and projects into the inferior aspect of the bladder. Other: Right inguinal hernia containing mesenteric fat and nondilated loops of small bowel. Musculoskeletal: Pelvis is intact.  No vertebral fractures. IMPRESSION: 1. Right pneumothorax, 5% of lung volume. 2. Right chest wall subcutaneous emphysema. 3. Right costochondral fractures. 4. Small anterior mediastinal hematoma. 5. No evidence for great vessel injury or acute intra-abdominal injury. 6. Bilateral renal cysts. 7. Right inguinal hernia contains nondilated loops of small bowel. 8. Colonic diverticulosis. 9. Prostatic hypertrophy. Electronically Signed   By: Nolon Nations M.D.   On: 08/13/2016 17:17   Ct Cervical Spine Wo Contrast  Result Date: 08/13/2016 CLINICAL DATA:  Fall from 25 feet, chest pain and lower back pain. EXAM: CT HEAD WITHOUT CONTRAST CT CERVICAL SPINE WITHOUT CONTRAST TECHNIQUE: Multidetector CT imaging of the head and cervical spine was performed following the standard protocol without intravenous contrast. Multiplanar CT image reconstructions of the cervical spine were also generated. COMPARISON:  None. FINDINGS: CT HEAD FINDINGS Brain: There is mild generalized parenchymal atrophy with commensurate dilatation of the ventricles and sulci. Mild chronic small vessel ischemic change noted within the  periventricular white matter regions bilaterally. There is no mass, hemorrhage, edema or other evidence of acute parenchymal abnormality. No extra-axial hemorrhage. Vascular: There are chronic calcified atherosclerotic changes of the large vessels at the skull base. No unexpected hyperdense vessel. Skull: Normal. Negative for fracture or focal lesion. Sinuses/Orbits: No acute finding. Other: None. CT CERVICAL SPINE FINDINGS Alignment: Alignment is normal.  No subluxation. Skull base and vertebrae: No fracture line or displaced fracture fragment identified. Facet joints appear intact and normally aligned throughout. Soft tissues and spinal canal: No prevertebral fluid or swelling. No visible canal hematoma. Disc levels: Degenerative changes throughout the cervical spine, mild to moderate in degree, most pronounced at the C6-7 level, no more than mild central canal stenosis appreciated at any level. Upper chest: Unremarkable. Atherosclerotic changes of the aortic arch. Surgical changes at the level of the right thyroid lobe. Other: None. IMPRESSION: 1. No acute intracranial abnormality. No intracranial mass, hemorrhage or edema. No skull fracture. Mild chronic small vessel ischemic change within the white matter. 2. No fracture or acute subluxation within the cervical spine. Degenerative changes of the cervical spine, as detailed above. 3. Aortic atherosclerosis. Electronically Signed   By: Franki Cabot M.D.   On: 08/13/2016 16:25   Ct Abdomen Pelvis W Contrast  Result Date: 08/13/2016 CLINICAL DATA:  Level 2 trauma. Fell 25 feet from a tree. Unsure of loss of consciousness. Chest pain a lower back pain. EXAM: CT CHEST, ABDOMEN, AND PELVIS WITH CONTRAST TECHNIQUE: Multidetector CT imaging of the chest, abdomen and pelvis was performed following the standard protocol during bolus administration of intravenous contrast. CONTRAST:  53m ISOVUE-300 IOPAMIDOL (ISOVUE-300) INJECTION 61% COMPARISON:  08/13/2016 plain  films, CT abdomen and pelvis on 08/19/2011 FINDINGS: CT CHEST FINDINGS Cardiovascular: Heart size is normal. There is three-vessel coronary artery disease. No pericardial effusion. Aortic valve calcification. There is atherosclerotic  calcification of the thoracic aorta not associated with aneurysm or dissection. Mediastinum/Nodes: Small anterior mediastinal hematoma at the level of the pericardial fat pad. Status post right thyroidectomy. No mediastinal or hilar adenopathy. Lungs/Pleura: There is a right-sided pneumothorax, estimated to be 5% of lung volume, seen primarily along the medial aspect of the lung. Right chest wall subcutaneous gas is present primarily along the anterior aspect of the chest. There is a small right pleural effusion. Minimal subsegmental atelectasis identified at the right lung base. Within the right lower lobe a pulmonary nodule measures 6 mm on image 92 of series 5. Within the left upper lobe a nodule measures 6 mm on image 50 of series 5. Musculoskeletal: Significant ecchymosis and edema of the anterior chest wall. There are fractures of at least 3 right costochondral junctions 4, 5, and 6. However the sternum is intact. Changes of diffuse idiopathic skeletal hyperostosis of the thoracic spine. No acute thoracic vertebral fractures. No acute displaced rib fractures. CT ABDOMEN PELVIS FINDINGS Hepatobiliary: Hepatic granulomata are present. The liver is homogeneous otherwise. Gallbladder is present. Pancreas: Unremarkable. No pancreatic ductal dilatation or surrounding inflammatory changes. Spleen: No splenic injury or perisplenic hematoma. Adrenals/Urinary Tract: The adrenal glands are normal in appearance. There are bilateral renal cysts. No hydronephrosis or evidence for acute renal injury . Stomach/Bowel: The stomach is normal in appearance. The appendix is well seen and has a normal appearance. Nondilated loops of small bowel are identified within a right inguinal hernia, showing no  evidence for obstruction or compromise. Extensive colonic diverticulosis, primarily involving the sigmoid colon. No associated inflammatory changes. Vascular/Lymphatic: Atherosclerotic calcification of the abdominal aorta and its branches. No retroperitoneal or mesenteric adenopathy. Reproductive: Prostate gland is prominent and projects into the inferior aspect of the bladder. Other: Right inguinal hernia containing mesenteric fat and nondilated loops of small bowel. Musculoskeletal: Pelvis is intact.  No vertebral fractures. IMPRESSION: 1. Right pneumothorax, 5% of lung volume. 2. Right chest wall subcutaneous emphysema. 3. Right costochondral fractures. 4. Small anterior mediastinal hematoma. 5. No evidence for great vessel injury or acute intra-abdominal injury. 6. Bilateral renal cysts. 7. Right inguinal hernia contains nondilated loops of small bowel. 8. Colonic diverticulosis. 9. Prostatic hypertrophy. Electronically Signed   By: Nolon Nations M.D.   On: 08/13/2016 17:17   Dg Pelvis Portable  Result Date: 08/13/2016 CLINICAL DATA:  Fall from tree 25 feet. EXAM: PORTABLE PELVIS 1-2 VIEWS COMPARISON:  None. FINDINGS: There is no evidence of pelvic fracture or diastasis. No pelvic bone lesions are seen. IMPRESSION: Negative. Electronically Signed   By: Franki Cabot M.D.   On: 08/13/2016 16:21   Dg Chest Port 1 View  Result Date: 08/13/2016 CLINICAL DATA:  Level 2 trauma. Patient fell from a tree 25 ft EXAM: PORTABLE CHEST 1 VIEW COMPARISON:  04/29/2015 FINDINGS: Cardiomediastinal silhouette is accentuated by the AP portable position of the patient. CT of the chest is performed on the same day. There is subcutaneous gas in the right chest wall. No definite pneumothorax identified on plain film. No acute displaced fractures. IMPRESSION: 1. Right chest wall subcutaneous gas. 2. No definite pneumothorax. 3. Mediastinal contour accentuated by the portable exam. CT of the chest will be performed later today.  Electronically Signed   By: Nolon Nations M.D.   On: 08/13/2016 16:19    Review of Systems  Unable to perform ROS: Language    Blood pressure (!) 151/54, pulse 60, temperature 98.2 F (36.8 C), resp. rate 20, height '5\' 9"'  (1.753  m), weight 77.1 kg (170 lb), SpO2 100 %. Physical Exam  Vitals reviewed. Constitutional: He is oriented to person, place, and time. He appears well-developed and well-nourished. No distress.  HENT:  Head: Normocephalic and atraumatic.  Right Ear: External ear normal.  Left Ear: External ear normal.  Mouth/Throat: Oropharynx is clear and moist.  Eyes: EOM are normal. Pupils are equal, round, and reactive to light.  Neck: Neck supple. No spinous process tenderness and no muscular tenderness present.  Cardiovascular: Normal rate, regular rhythm, normal heart sounds and intact distal pulses.   Respiratory: Effort normal and breath sounds normal. He exhibits tenderness (right chest).  GI: Soft. Bowel sounds are normal. There is no tenderness.  Musculoskeletal: Normal range of motion. He exhibits no edema or tenderness.  Lymphadenopathy:    He has no cervical adenopathy.  Neurological: He is alert and oriented to person, place, and time. He has normal strength. No sensory deficit.  Skin: Skin is warm and dry. He is not diaphoretic.     Assessment/Plan Fall with right rib fractures, ct found ptx, costochondral fracture, small anterior med hematoma  Admit for pain control I dont think needs chest tube/pigtail.  Will place in stepdown and repeat cxr in am pulm toilet, ics Lovenox, scds  Kierstynn Babich, MD 08/13/2016, 7:02 PM

## 2016-08-13 NOTE — ED Notes (Signed)
Patient transported to CT with RN pt on cardiac monitoring.

## 2016-08-13 NOTE — Progress Notes (Signed)
   08/13/16 1510  Clinical Encounter Type  Visited With Health care provider  Visit Type ED  Spiritual Encounters  Spiritual Needs Emotional  Stress Factors  Patient Stress Factors Not reviewed  Received page, but then paged for code blue. Pt at CT scan. Awaiting family. Will follow up as needed.

## 2016-08-13 NOTE — ED Provider Notes (Signed)
[facility] DEPT Provider Note   CSN: [id number] Arrival date & time: 08/13/16  1519     History   Chief Complaint Chief Complaint  Patient presents with  . Trauma    HPI [name] is a 83 y.o. male.  HPI Per EMS- pt standing in tree working on branches. Fell 20-25 feet. Unsure of LOC. Pt reports chest pain. EMS reports crepitus to right chest. 18G PIv to LAC 22G Rhand placed in field.  History limited due to acuity of condition and patient speaking [place] only  However, states that pain is in the anterior and right chest but nowhere else  Daughter is at the bedside and states that when she is not with him he will clean gutters and go on trees to work on the garden  Patient is not on any blood thinners except aspirin  It is unclear if he hit his head or had loss of consciousness  Daughter states that he did not pass out but instead fell off the tree when the branch broke Past Medical History:  Diagnosis Date  . At risk for sleep apnea    STOP-BANG= 4    SENT TO PCP 04-04-2014  . BPH (benign prostatic hypertrophy)   . Depression   . Diverticulosis of colon   . GERD (gastroesophageal reflux disease)   . Gilbert's syndrome   . Headache    migraines  . Hearing loss   . Heart murmur   . Hepatitis    Hep A as a child  . History of gastroenteritis   . History of kidney stones   . History of syncope    2002--  vasovagal  . HOH (hard of hearing)    refuses to wear his Hearing Aid  . Hypertension   . Inguinal hernia    right  . Internal hemorrhoid, bleeding   . Moderate aortic stenosis    a. Last echo 11/2013, 06/2014.  Marland Kitchen Nocturia   . Normal coronary arteries    a. Cath 07/2010: normal coronaries. Felt to have noncardiac CP/SOB at that time possibly r/t anxiety.  . RA (rheumatoid arthritis) ([place])    bilateral hands/ wrist--  seronegative  . Rotator cuff syndrome of right shoulder 08/01/2013  . Shortness of breath dyspnea    with exertion  . Stone, kidney     . Thyroid cyst   . Thyroid nodule    noted 11-2009  . Wears dentures     Patient Active Problem List   Diagnosis Date Noted  . Pneumothorax 08/13/2016  . 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 Aortic stenosis 06/20/2014  . Chest tightness 06/20/2014  . Essential hypertension 06/20/2014  . Insomnia 06/20/2014  . Sinus bradycardia 06/20/2014  . Rectal bleeding 01/20/2014  . Aortic valve disorders 11/07/2013  . Rotator cuff syndrome of right shoulder 08/01/2013    Past Surgical History:  Procedure Laterality Date  . CARDIAC CATHETERIZATION  07-19-2010  dr Tressia Miners turner   normal coronary arteries,  ef 60%,  moderate aortic stenosis- gradient 95mmHg, normal right heart pressure  . CARDIOVASCULAR STRESS TEST  05/ 2011   dr Marlou Porch   normal low risk perfusion study  . CATARACT EXTRACTION W/ INTRAOCULAR LENS  IMPLANT, BILATERAL  2013  . COLONOSCOPY  2010  approx  . THYROID LOBECTOMY Right 05/05/2015   Procedure: RIGHT THYROID LOBECTOMY;  Surgeon: Armandina Gemma, MD;  Location: [facility];  Service: General;  Laterality: Right;  . TRANSANAL HEMORRHOIDAL DEARTERIALIZATION N/A  04/09/2014   Procedure: TRANSANAL HEMORRHOIDAL DEARTERIALIZATION OF INTERNAL HEMORROIDS;  Surgeon: Leighton Ruff, MD;  Location: [facility];  Service: General;  Laterality: N/A;  . TRANSTHORACIC ECHOCARDIOGRAM  11-26-2013   moderate focal basal and mild LVH/  ef [id number]  grade I diastolic dysfunction/ mild LAE/  moderate calcification with stenosis AV with mild regurg,  gradients 35 abd 58 mmHg /  mild TR       Home Medications    Prior to Admission medications   Medication Sig Start Date End Date Taking? Authorizing Provider  aspirin EC 81 MG tablet Take 81 mg by mouth daily.   Yes Historical Provider, MD  DIOVAN 80 MG tablet Take 80 mg by mouth every morning.  05/19/13  Yes Historical Provider, MD  escitalopram (LEXAPRO) 20 MG tablet Take 20 mg by mouth daily.   07/02/14  Yes Historical Provider, MD  folic acid (FOLVITE) 1 MG tablet Take 1 mg by mouth daily.  01/27/15  Yes Historical Provider, MD  hydrochlorothiazide (MICROZIDE) 12.5 MG capsule Take 12.5 mg by mouth daily. 06/04/13  Yes Historical Provider, MD  HYDROcodone-acetaminophen (NORCO/VICODIN) 5-325 MG tablet Take 1-2 tablets by mouth every 4 (four) hours as needed for moderate pain. 05/06/15  Yes Armandina Gemma, MD  ibuprofen (ADVIL,MOTRIN) 800 MG tablet Take 800 mg by mouth every 8 (eight) hours as needed for moderate pain.  06/02/13  Yes Historical Provider, MD  methotrexate (RHEUMATREX) 2.5 MG tablet Take 22.5 mg by mouth once a week. TAKE ON SUNDAYS 07/13/13  Yes Historical Provider, MD  omeprazole (PRILOSEC) 20 MG capsule TAKE 1 CAPSULE(20 MG) BY MOUTH DAILY 06/15/16  Yes Jerline Pain, MD  simvastatin (ZOCOR) 20 MG tablet TAKE 1 TABLET BY MOUTH EVERY NIGHT AT BEDTIME 05/06/14  Yes Jerline Pain, MD  dutasteride (AVODART) 0.5 MG capsule Take 0.5 mg by mouth daily. 12/30/14   Historical Provider, MD    Family History Family History  Problem Relation Age of Onset  . Pulmonary embolism Mother 32    caused by complications of surgery  . CVA Father   . Diabetes Brother   . Diabetes Brother   . Breast cancer Sister     Social History Social History  Substance Use Topics  . Smoking status: Former Smoker    Years: 40.00    Types: Cigarettes    Start date: 06/13/1973    Quit date: 04/03/1981  . Smokeless tobacco: Never Used  . Alcohol use Yes     Comment: occasional     Allergies   Patient has no known allergies.   Review of Systems Review of Systems  Unable to perform ROS: Acuity of condition     Physical Exam Updated Vital Signs BP (!) 118/54 (BP Location: Left Arm)   Pulse 66   Temp 97.9 F (36.6 C) (Oral)   Resp 17   Ht 5\' 9"  (1.753 m)   Wt 77.1 kg   SpO2 100%   BMI 25.10 kg/m   Physical Exam  Constitutional: He appears well-developed and well-nourished. No  distress.  HENT:  Head: Normocephalic and atraumatic.  Eyes: Conjunctivae are normal. Pupils are equal, round, and reactive to light. Right eye exhibits no discharge. Left eye exhibits no discharge.  Neck: Normal range of motion. Neck supple.  Cardiovascular: Normal rate and regular rhythm.   No murmur heard. Pulmonary/Chest: Effort normal and breath sounds normal. No respiratory distress. He has no wheezes. He has no rales. He exhibits tenderness (right chest and  substernal area).  Abdominal: Soft. Bowel sounds are normal. He exhibits no distension and no mass. There is no tenderness. There is no rebound and no guarding.  Musculoskeletal: Normal range of motion. He exhibits no edema, tenderness or deformity.  Neurological: He is alert.  Skin: Skin is warm. He is not diaphoretic.  Psychiatric: He has a normal mood and affect.   Palpated face, c/t/l spine, clavicles, extremitiesx4, abdomen/pelvis wo significant tenderness or swelling  ED Treatments / Results  Labs (all labs ordered are listed, but only abnormal results are displayed) Labs Reviewed  COMPREHENSIVE METABOLIC PANEL - Abnormal; Notable for the following:       Result Value   Glucose, Bld 120 (*)    AST 88 (*)    GFR calc non Af Amer 54 (*)    All other components within normal limits  CBC - Abnormal; Notable for the following:    WBC 13.1 (*)    RBC 4.01 (*)    Hemoglobin 11.9 (*)    HCT 36.1 (*)    All other components within normal limits  URINALYSIS, ROUTINE W REFLEX MICROSCOPIC - Abnormal; Notable for the following:    Specific Gravity, Urine 1.038 (*)    Hgb urine dipstick LARGE (*)    Ketones, ur 20 (*)    All other components within normal limits  I-STAT CHEM 8, ED - Abnormal; Notable for the following:    BUN 25 (*)    Glucose, Bld 120 (*)    Calcium, Ion 1.09 (*)    Hemoglobin 12.9 (*)    HCT 38.0 (*)    All other components within normal limits  ETHANOL  PROTIME-INR  CDS SEROLOGY  CBC  BASIC  METABOLIC PANEL  I-STAT CG4 LACTIC ACID, ED  SAMPLE TO BLOOD BANK    EKG  EKG Interpretation None       Radiology Ct Head Wo Contrast  Result Date: 08/13/2016 CLINICAL DATA:  Fall from 25 feet, chest pain and lower back pain. EXAM: CT HEAD WITHOUT CONTRAST CT CERVICAL SPINE WITHOUT CONTRAST TECHNIQUE: Multidetector CT imaging of the head and cervical spine was performed following the standard protocol without intravenous contrast. Multiplanar CT image reconstructions of the cervical spine were also generated. COMPARISON:  None. FINDINGS: CT HEAD FINDINGS Brain: There is mild generalized parenchymal atrophy with commensurate dilatation of the ventricles and sulci. Mild chronic small vessel ischemic change noted within the periventricular white matter regions bilaterally. There is no mass, hemorrhage, edema or other evidence of acute parenchymal abnormality. No extra-axial hemorrhage. Vascular: There are chronic calcified atherosclerotic changes of the large vessels at the skull base. No unexpected hyperdense vessel. Skull: Normal. Negative for fracture or focal lesion. Sinuses/Orbits: No acute finding. Other: None. CT CERVICAL SPINE FINDINGS Alignment: Alignment is normal.  No subluxation. Skull base and vertebrae: No fracture line or displaced fracture fragment identified. Facet joints appear intact and normally aligned throughout. Soft tissues and spinal canal: No prevertebral fluid or swelling. No visible canal hematoma. Disc levels: Degenerative changes throughout the cervical spine, mild to moderate in degree, most pronounced at the C6-7 level, no more than mild central canal stenosis appreciated at any level. Upper chest: Unremarkable. Atherosclerotic changes of the aortic arch. Surgical changes at the level of the right thyroid lobe. Other: None. IMPRESSION: 1. No acute intracranial abnormality. No intracranial mass, hemorrhage or edema. No skull fracture. Mild chronic small vessel ischemic  change within the white matter. 2. No fracture or acute subluxation within the cervical  spine. Degenerative changes of the cervical spine, as detailed above. 3. Aortic atherosclerosis. Electronically Signed   By: Franki Cabot M.D.   On: 08/13/2016 16:25   Ct Chest W Contrast  Result Date: 08/13/2016 CLINICAL DATA:  Level 2 trauma. Fell 25 feet from a tree. Unsure of loss of consciousness. Chest pain a lower back pain. EXAM: CT CHEST, ABDOMEN, AND PELVIS WITH CONTRAST TECHNIQUE: Multidetector CT imaging of the chest, abdomen and pelvis was performed following the standard protocol during bolus administration of intravenous contrast. CONTRAST:  56mL ISOVUE-300 IOPAMIDOL (ISOVUE-300) INJECTION 61% COMPARISON:  08/13/2016 plain films, CT abdomen and pelvis on 08/19/2011 FINDINGS: CT CHEST FINDINGS Cardiovascular: Heart size is normal. There is three-vessel coronary artery disease. No pericardial effusion. Aortic valve calcification. There is atherosclerotic calcification of the thoracic aorta not associated with aneurysm or dissection. Mediastinum/Nodes: Small anterior mediastinal hematoma at the level of the pericardial fat pad. Status post right thyroidectomy. No mediastinal or hilar adenopathy. Lungs/Pleura: There is a right-sided pneumothorax, estimated to be 5% of lung volume, seen primarily along the medial aspect of the lung. Right chest wall subcutaneous gas is present primarily along the anterior aspect of the chest. There is a small right pleural effusion. Minimal subsegmental atelectasis identified at the right lung base. Within the right lower lobe a pulmonary nodule measures 6 mm on image 92 of series 5. Within the left upper lobe a nodule measures 6 mm on image 50 of series 5. Musculoskeletal: Significant ecchymosis and edema of the anterior chest wall. There are fractures of at least 3 right costochondral junctions 4, 5, and 6. However the sternum is intact. Changes of diffuse idiopathic skeletal  hyperostosis of the thoracic spine. No acute thoracic vertebral fractures. No acute displaced rib fractures. CT ABDOMEN PELVIS FINDINGS Hepatobiliary: Hepatic granulomata are present. The liver is homogeneous otherwise. Gallbladder is present. Pancreas: Unremarkable. No pancreatic ductal dilatation or surrounding inflammatory changes. Spleen: No splenic injury or perisplenic hematoma. Adrenals/Urinary Tract: The adrenal glands are normal in appearance. There are bilateral renal cysts. No hydronephrosis or evidence for acute renal injury . Stomach/Bowel: The stomach is normal in appearance. The appendix is well seen and has a normal appearance. Nondilated loops of small bowel are identified within a right inguinal hernia, showing no evidence for obstruction or compromise. Extensive colonic diverticulosis, primarily involving the sigmoid colon. No associated inflammatory changes. Vascular/Lymphatic: Atherosclerotic calcification of the abdominal aorta and its branches. No retroperitoneal or mesenteric adenopathy. Reproductive: Prostate gland is prominent and projects into the inferior aspect of the bladder. Other: Right inguinal hernia containing mesenteric fat and nondilated loops of small bowel. Musculoskeletal: Pelvis is intact.  No vertebral fractures. IMPRESSION: 1. Right pneumothorax, 5% of lung volume. 2. Right chest wall subcutaneous emphysema. 3. Right costochondral fractures. 4. Small anterior mediastinal hematoma. 5. No evidence for great vessel injury or acute intra-abdominal injury. 6. Bilateral renal cysts. 7. Right inguinal hernia contains nondilated loops of small bowel. 8. Colonic diverticulosis. 9. Prostatic hypertrophy. Electronically Signed   By: Nolon Nations M.D.   On: 08/13/2016 17:17   Ct Cervical Spine Wo Contrast  Result Date: 08/13/2016 CLINICAL DATA:  Fall from 25 feet, chest pain and lower back pain. EXAM: CT HEAD WITHOUT CONTRAST CT CERVICAL SPINE WITHOUT CONTRAST TECHNIQUE:  Multidetector CT imaging of the head and cervical spine was performed following the standard protocol without intravenous contrast. Multiplanar CT image reconstructions of the cervical spine were also generated. COMPARISON:  None. FINDINGS: CT HEAD FINDINGS Brain: There is  mild generalized parenchymal atrophy with commensurate dilatation of the ventricles and sulci. Mild chronic small vessel ischemic change noted within the periventricular white matter regions bilaterally. There is no mass, hemorrhage, edema or other evidence of acute parenchymal abnormality. No extra-axial hemorrhage. Vascular: There are chronic calcified atherosclerotic changes of the large vessels at the skull base. No unexpected hyperdense vessel. Skull: Normal. Negative for fracture or focal lesion. Sinuses/Orbits: No acute finding. Other: None. CT CERVICAL SPINE FINDINGS Alignment: Alignment is normal.  No subluxation. Skull base and vertebrae: No fracture line or displaced fracture fragment identified. Facet joints appear intact and normally aligned throughout. Soft tissues and spinal canal: No prevertebral fluid or swelling. No visible canal hematoma. Disc levels: Degenerative changes throughout the cervical spine, mild to moderate in degree, most pronounced at the C6-7 level, no more than mild central canal stenosis appreciated at any level. Upper chest: Unremarkable. Atherosclerotic changes of the aortic arch. Surgical changes at the level of the right thyroid lobe. Other: None. IMPRESSION: 1. No acute intracranial abnormality. No intracranial mass, hemorrhage or edema. No skull fracture. Mild chronic small vessel ischemic change within the white matter. 2. No fracture or acute subluxation within the cervical spine. Degenerative changes of the cervical spine, as detailed above. 3. Aortic atherosclerosis. Electronically Signed   By: Franki Cabot M.D.   On: 08/13/2016 16:25   Ct Abdomen Pelvis W Contrast  Result Date: 08/13/2016 CLINICAL  DATA:  Level 2 trauma. Fell 25 feet from a tree. Unsure of loss of consciousness. Chest pain a lower back pain. EXAM: CT CHEST, ABDOMEN, AND PELVIS WITH CONTRAST TECHNIQUE: Multidetector CT imaging of the chest, abdomen and pelvis was performed following the standard protocol during bolus administration of intravenous contrast. CONTRAST:  20mL ISOVUE-300 IOPAMIDOL (ISOVUE-300) INJECTION 61% COMPARISON:  08/13/2016 plain films, CT abdomen and pelvis on 08/19/2011 FINDINGS: CT CHEST FINDINGS Cardiovascular: Heart size is normal. There is three-vessel coronary artery disease. No pericardial effusion. Aortic valve calcification. There is atherosclerotic calcification of the thoracic aorta not associated with aneurysm or dissection. Mediastinum/Nodes: Small anterior mediastinal hematoma at the level of the pericardial fat pad. Status post right thyroidectomy. No mediastinal or hilar adenopathy. Lungs/Pleura: There is a right-sided pneumothorax, estimated to be 5% of lung volume, seen primarily along the medial aspect of the lung. Right chest wall subcutaneous gas is present primarily along the anterior aspect of the chest. There is a small right pleural effusion. Minimal subsegmental atelectasis identified at the right lung base. Within the right lower lobe a pulmonary nodule measures 6 mm on image 92 of series 5. Within the left upper lobe a nodule measures 6 mm on image 50 of series 5. Musculoskeletal: Significant ecchymosis and edema of the anterior chest wall. There are fractures of at least 3 right costochondral junctions 4, 5, and 6. However the sternum is intact. Changes of diffuse idiopathic skeletal hyperostosis of the thoracic spine. No acute thoracic vertebral fractures. No acute displaced rib fractures. CT ABDOMEN PELVIS FINDINGS Hepatobiliary: Hepatic granulomata are present. The liver is homogeneous otherwise. Gallbladder is present. Pancreas: Unremarkable. No pancreatic ductal dilatation or surrounding  inflammatory changes. Spleen: No splenic injury or perisplenic hematoma. Adrenals/Urinary Tract: The adrenal glands are normal in appearance. There are bilateral renal cysts. No hydronephrosis or evidence for acute renal injury . Stomach/Bowel: The stomach is normal in appearance. The appendix is well seen and has a normal appearance. Nondilated loops of small bowel are identified within a right inguinal hernia, showing no evidence for obstruction  or compromise. Extensive colonic diverticulosis, primarily involving the sigmoid colon. No associated inflammatory changes. Vascular/Lymphatic: Atherosclerotic calcification of the abdominal aorta and its branches. No retroperitoneal or mesenteric adenopathy. Reproductive: Prostate gland is prominent and projects into the inferior aspect of the bladder. Other: Right inguinal hernia containing mesenteric fat and nondilated loops of small bowel. Musculoskeletal: Pelvis is intact.  No vertebral fractures. IMPRESSION: 1. Right pneumothorax, 5% of lung volume. 2. Right chest wall subcutaneous emphysema. 3. Right costochondral fractures. 4. Small anterior mediastinal hematoma. 5. No evidence for great vessel injury or acute intra-abdominal injury. 6. Bilateral renal cysts. 7. Right inguinal hernia contains nondilated loops of small bowel. 8. Colonic diverticulosis. 9. Prostatic hypertrophy. Electronically Signed   By: Nolon Nations M.D.   On: 08/13/2016 17:17   Dg Pelvis Portable  Result Date: 08/13/2016 CLINICAL DATA:  Fall from tree 25 feet. EXAM: PORTABLE PELVIS 1-2 VIEWS COMPARISON:  None. FINDINGS: There is no evidence of pelvic fracture or diastasis. No pelvic bone lesions are seen. IMPRESSION: Negative. Electronically Signed   By: Franki Cabot M.D.   On: 08/13/2016 16:21   Dg Chest Port 1 View  Result Date: 08/13/2016 CLINICAL DATA:  Level 2 trauma. Patient fell from a tree 25 ft EXAM: PORTABLE CHEST 1 VIEW COMPARISON:  04/29/2015 FINDINGS: Cardiomediastinal  silhouette is accentuated by the AP portable position of the patient. CT of the chest is performed on the same day. There is subcutaneous gas in the right chest wall. No definite pneumothorax identified on plain film. No acute displaced fractures. IMPRESSION: 1. Right chest wall subcutaneous gas. 2. No definite pneumothorax. 3. Mediastinal contour accentuated by the portable exam. CT of the chest will be performed later today. Electronically Signed   By: Nolon Nations M.D.   On: 08/13/2016 16:19    Procedures Procedures (including critical care time)  Medications Ordered in ED Medications  sodium chloride 0.9 % bolus 1,000 mL (0 mLs Intravenous Stopped 08/13/16 1958)    And  0.9 %  sodium chloride infusion (not administered)  enoxaparin (LOVENOX) injection 40 mg (not administered)  acetaminophen (TYLENOL) tablet 650 mg (not administered)  morphine 2 MG/ML injection 1 mg (1 mg Intravenous Given 08/13/16 2044)  ondansetron (ZOFRAN) tablet 4 mg (not administered)    Or  ondansetron (ZOFRAN) injection 4 mg (not administered)  traMADol (ULTRAM) tablet 50 mg (50 mg Oral Given 08/13/16 2322)  irbesartan (AVAPRO) tablet 75 mg (not administered)  aspirin EC tablet 81 mg (not administered)  escitalopram (LEXAPRO) tablet 20 mg (not administered)  hydrochlorothiazide (MICROZIDE) capsule 12.5 mg (not administered)  ibuprofen (ADVIL,MOTRIN) tablet 800 mg (800 mg Oral Given 08/13/16 2213)  pantoprazole (PROTONIX) EC tablet 40 mg (40 mg Oral Not Given 08/13/16 2015)  simvastatin (ZOCOR) tablet 20 mg (20 mg Oral Given 08/13/16 2213)  fentaNYL (SUBLIMAZE) injection 50 mcg (50 mcg Intravenous Given 08/13/16 1535)  iopamidol (ISOVUE-300) 61 % injection (75 mLs  Contrast Given 08/13/16 1539)     Initial Impression / Assessment and Plan / ED Course  I have reviewed the triage vital signs and the nursing notes.  Pertinent labs & imaging results that were available during my care of the patient were reviewed by me and  considered in my medical decision making (see chart for details).     Full-trauma scans reveal multiple rib fractures with associated occult pneumothorax The remainder of the trauma evaluation without significant blood loss, organ injury No fracture suspected otherwise Vital signs are reassuring and patient's pain is controlled  on IV narcotics Discussed with trauma Family updated and in agreement with plan to admit to trauma for close monitoring and pain control  Final Clinical Impressions(s) / ED Diagnoses   Final diagnoses:  Pneumothorax    New Prescriptions Current Discharge Medication List       Karma Greaser, MD 08/14/16 0030    Carmin Muskrat, MD 08/18/16 2145

## 2016-08-13 NOTE — ED Notes (Signed)
Family at beside. Family given emotional support. 

## 2016-08-13 NOTE — ED Notes (Signed)
accompanied patient to CT.

## 2016-08-13 NOTE — ED Triage Notes (Signed)
Per EMS- pt standing in tree working on branches. Fell 20-25 feet. Unsure of LOC. Pt reports chest pain. EMS reports crepitus to right chest. 18G PIv to LAC 22G Rhand placed in field.

## 2016-08-14 ENCOUNTER — Encounter (HOSPITAL_COMMUNITY): Payer: Self-pay

## 2016-08-14 ENCOUNTER — Inpatient Hospital Stay (HOSPITAL_COMMUNITY): Payer: Medicare Other

## 2016-08-14 LAB — BASIC METABOLIC PANEL
Anion gap: 7 (ref 5–15)
BUN: 16 mg/dL (ref 6–20)
CALCIUM: 8.5 mg/dL — AB (ref 8.9–10.3)
CO2: 26 mmol/L (ref 22–32)
CREATININE: 1.11 mg/dL (ref 0.61–1.24)
Chloride: 105 mmol/L (ref 101–111)
GFR calc Af Amer: >60 mL/min (ref >60)
GFR, EST NON AFRICAN AMERICAN: 58 mL/min — AB (ref >60)
GLUCOSE: 115 mg/dL — AB (ref 65–99)
Potassium: 3.6 mmol/L (ref 3.5–5.1)
SODIUM: 138 mmol/L (ref 135–145)

## 2016-08-14 LAB — MRSA PCR SCREENING: MRSA BY PCR: NEGATIVE

## 2016-08-14 LAB — CBC
HCT: 32.9 % — ABNORMAL LOW (ref 39.0–52.0)
Hemoglobin: 11 g/dL — ABNORMAL LOW (ref 13.0–17.0)
MCH: 30.2 pg (ref 26.0–34.0)
MCHC: 33.4 g/dL (ref 30.0–36.0)
MCV: 90.4 fL (ref 78.0–100.0)
PLATELETS: 135 10*3/uL — AB (ref 150–400)
RBC: 3.64 MIL/uL — AB (ref 4.22–5.81)
RDW: 13.4 % (ref 11.5–15.5)
WBC: 7.4 10*3/uL (ref 4.0–10.5)

## 2016-08-14 MED ORDER — DOCUSATE SODIUM 100 MG PO CAPS
100.0000 mg | ORAL_CAPSULE | Freq: Two times a day (BID) | ORAL | Status: DC
  Administered 2016-08-14 – 2016-08-28 (×23): 100 mg via ORAL
  Filled 2016-08-14 (×25): qty 1

## 2016-08-14 MED ORDER — BISACODYL 10 MG RE SUPP: 10.0000 mg | Freq: Every day | RECTAL | Status: DC | PRN

## 2016-08-14 MED ORDER — MAGNESIUM HYDROXIDE 400 MG/5ML PO SUSP: 15.0000 mL | Freq: Every day | ORAL | Status: DC | PRN

## 2016-08-14 NOTE — Progress Notes (Signed)
Pt still complaining that it is hard to breathe, but O2 is 98% and HR is 60's. Patient has subcutaneous air through out the right arm, and right upper chest. I have paged MD Trauma, will continue to monitored patient

## 2016-08-14 NOTE — Progress Notes (Signed)
S: c/o chest wall pain. satting well on [place]  Vitals, labs, intake/output, and orders reviewed at this time.  Gen: alert, no distress H&N: EOMI, atraumatic, neck supple Chest: unlabored respirations, RRR. Expected chest wall tenderness and crepitus. Pulls >105mL on Is.  Abd: soft, nontender, nondistended Ext: warm, no edema Neuro: grossly normal  Lines/tubes/drains: PIV  A/P:  HD 2 83yo m with R rib fx, costochondral fx, small anterior medisatinal hematoma, small ptx and subcu emphysema. AM CXR stable Continue multimodal pain control, aggressive pulm toilet Repeat CXR tomorrow   [name], MD [facility], [place] Pager [phone]

## 2016-08-14 NOTE — Progress Notes (Signed)
MD paged, pt stating hard to breathe, O2 sats 97%, subcutaneous air in right arm and right chest.

## 2016-08-14 NOTE — Progress Notes (Signed)
Pt family had concerns with dark urine and pt not drinking because he "has a fear of choking" because "he chokes a lot". MD notified and gave verbal order for 500 mL bolus of NS and speech evaluation.

## 2016-08-15 ENCOUNTER — Encounter (HOSPITAL_COMMUNITY): Payer: Self-pay

## 2016-08-15 ENCOUNTER — Inpatient Hospital Stay (HOSPITAL_COMMUNITY): Payer: Medicare Other

## 2016-08-15 MED ORDER — KETOROLAC TROMETHAMINE 30 MG/ML IJ SOLN
30.0000 mg | Freq: Once | INTRAMUSCULAR | Status: AC
  Administered 2016-08-15: 30 mg via INTRAVENOUS
  Filled 2016-08-15: qty 1

## 2016-08-15 MED ORDER — OXYCODONE HCL 5 MG PO TABS
5.0000 mg | ORAL_TABLET | ORAL | Status: DC | PRN
  Administered 2016-08-18 – 2016-08-19 (×6): 5 mg via ORAL
  Filled 2016-08-15 (×7): qty 1

## 2016-08-15 MED ORDER — POLYETHYLENE GLYCOL 3350 17 GM/SCOOP PO POWD
1.0000 | Freq: Once | ORAL | Status: AC
  Administered 2016-08-15: 255 g via ORAL
  Filled 2016-08-15: qty 255

## 2016-08-15 MED ORDER — CYCLOBENZAPRINE HCL 10 MG PO TABS
5.0000 mg | ORAL_TABLET | Freq: Three times a day (TID) | ORAL | Status: DC
  Administered 2016-08-15 – 2016-08-28 (×34): 5 mg via ORAL
  Filled 2016-08-15 (×37): qty 1

## 2016-08-15 MED ORDER — ACETAMINOPHEN 325 MG PO TABS
650.0000 mg | ORAL_TABLET | Freq: Four times a day (QID) | ORAL | Status: DC
  Administered 2016-08-15 – 2016-08-19 (×11): 650 mg via ORAL
  Filled 2016-08-15 (×12): qty 2

## 2016-08-15 NOTE — Progress Notes (Signed)
Trauma Service Note  Subjective: I talked to the patient with his family and daughters who interpreted for [place].  One is a medical provider.  He expresses concern about the amount of pain that he is having and is afraid to move or sit up because of pain.  Objective: Vital signs in last 24 hours: Temp:  [97.8 F (36.6 C)-99.1 F (37.3 C)] 98.8 F (37.1 C) (03/05 0735) Pulse Rate:  [59-112] 67 (03/05 0735) Resp:  [15-21] 20 (03/05 0735) BP: (100-135)/(54-63) 126/56 (03/05 0735) SpO2:  [89 %-100 %] 91 % (03/05 0735) Last BM Date: 08/13/16  Intake/Output from previous day: 03/04 0701 - 03/05 0700 In: 600 [P.O.:600] Out: 500 [Urine:500] Intake/Output this shift: Total I/O In: 300 [P.O.:300] Out: -   General: No distress at rest  Lungs: Clear.  CXR without expanding PTX.  Crepitance on the right side of his chest.  Abd: Soft, benign.  Not eating currently because of concerns about aspiration.  Patient will not sit up  Extremities: No changes  Neuro: Intact  Lab Results: CBC   Recent Labs  08/13/16 1530 08/13/16 1543 08/14/16 0211  WBC 13.1*  --  7.4  HGB 11.9* 12.9* 11.0*  HCT 36.1* 38.0* 32.9*  PLT 156  --  135*   BMET  Recent Labs  08/13/16 1530 08/13/16 1543 08/14/16 0211  NA 135 138 138  K 4.0 4.0 3.6  CL 102 102 105  CO2 23  --  26  GLUCOSE 120* 120* 115*  BUN 18 25* 16  CREATININE 1.18 1.20 1.11  CALCIUM 8.9  --  8.5*   PT/INR  Recent Labs  08/13/16 1530  LABPROT 14.5  INR 1.12   ABG No results for input(s): PHART, HCO3 in the last 72 hours.  Invalid input(s): PCO2, PO2  Studies/Results: Ct Head Wo Contrast  Result Date: 08/13/2016 CLINICAL DATA:  Fall from 25 feet, chest pain and lower back pain. EXAM: CT HEAD WITHOUT CONTRAST CT CERVICAL SPINE WITHOUT CONTRAST TECHNIQUE: Multidetector CT imaging of the head and cervical spine was performed following the standard protocol without intravenous contrast. Multiplanar CT image reconstructions  of the cervical spine were also generated. COMPARISON:  None. FINDINGS: CT HEAD FINDINGS Brain: There is mild generalized parenchymal atrophy with commensurate dilatation of the ventricles and sulci. Mild chronic small vessel ischemic change noted within the periventricular white matter regions bilaterally. There is no mass, hemorrhage, edema or other evidence of acute parenchymal abnormality. No extra-axial hemorrhage. Vascular: There are chronic calcified atherosclerotic changes of the large vessels at the skull base. No unexpected hyperdense vessel. Skull: Normal. Negative for fracture or focal lesion. Sinuses/Orbits: No acute finding. Other: None. CT CERVICAL SPINE FINDINGS Alignment: Alignment is normal.  No subluxation. Skull base and vertebrae: No fracture line or displaced fracture fragment identified. Facet joints appear intact and normally aligned throughout. Soft tissues and spinal canal: No prevertebral fluid or swelling. No visible canal hematoma. Disc levels: Degenerative changes throughout the cervical spine, mild to moderate in degree, most pronounced at the C6-7 level, no more than mild central canal stenosis appreciated at any level. Upper chest: Unremarkable. Atherosclerotic changes of the aortic arch. Surgical changes at the level of the right thyroid lobe. Other: None. IMPRESSION: 1. No acute intracranial abnormality. No intracranial mass, hemorrhage or edema. No skull fracture. Mild chronic small vessel ischemic change within the white matter. 2. No fracture or acute subluxation within the cervical spine. Degenerative changes of the cervical spine, as detailed above. 3. Aortic  atherosclerosis. Electronically Signed   By: Franki Cabot M.D.   On: 08/13/2016 16:25   Ct Chest W Contrast  Result Date: 08/13/2016 CLINICAL DATA:  Level 2 trauma. Fell 25 feet from a tree. Unsure of loss of consciousness. Chest pain a lower back pain. EXAM: CT CHEST, ABDOMEN, AND PELVIS WITH CONTRAST TECHNIQUE:  Multidetector CT imaging of the chest, abdomen and pelvis was performed following the standard protocol during bolus administration of intravenous contrast. CONTRAST:  12mL ISOVUE-300 IOPAMIDOL (ISOVUE-300) INJECTION 61% COMPARISON:  08/13/2016 plain films, CT abdomen and pelvis on 08/19/2011 FINDINGS: CT CHEST FINDINGS Cardiovascular: Heart size is normal. There is three-vessel coronary artery disease. No pericardial effusion. Aortic valve calcification. There is atherosclerotic calcification of the thoracic aorta not associated with aneurysm or dissection. Mediastinum/Nodes: Small anterior mediastinal hematoma at the level of the pericardial fat pad. Status post right thyroidectomy. No mediastinal or hilar adenopathy. Lungs/Pleura: There is a right-sided pneumothorax, estimated to be 5% of lung volume, seen primarily along the medial aspect of the lung. Right chest wall subcutaneous gas is present primarily along the anterior aspect of the chest. There is a small right pleural effusion. Minimal subsegmental atelectasis identified at the right lung base. Within the right lower lobe a pulmonary nodule measures 6 mm on image 92 of series 5. Within the left upper lobe a nodule measures 6 mm on image 50 of series 5. Musculoskeletal: Significant ecchymosis and edema of the anterior chest wall. There are fractures of at least 3 right costochondral junctions 4, 5, and 6. However the sternum is intact. Changes of diffuse idiopathic skeletal hyperostosis of the thoracic spine. No acute thoracic vertebral fractures. No acute displaced rib fractures. CT ABDOMEN PELVIS FINDINGS Hepatobiliary: Hepatic granulomata are present. The liver is homogeneous otherwise. Gallbladder is present. Pancreas: Unremarkable. No pancreatic ductal dilatation or surrounding inflammatory changes. Spleen: No splenic injury or perisplenic hematoma. Adrenals/Urinary Tract: The adrenal glands are normal in appearance. There are bilateral renal cysts.  No hydronephrosis or evidence for acute renal injury . Stomach/Bowel: The stomach is normal in appearance. The appendix is well seen and has a normal appearance. Nondilated loops of small bowel are identified within a right inguinal hernia, showing no evidence for obstruction or compromise. Extensive colonic diverticulosis, primarily involving the sigmoid colon. No associated inflammatory changes. Vascular/Lymphatic: Atherosclerotic calcification of the abdominal aorta and its branches. No retroperitoneal or mesenteric adenopathy. Reproductive: Prostate gland is prominent and projects into the inferior aspect of the bladder. Other: Right inguinal hernia containing mesenteric fat and nondilated loops of small bowel. Musculoskeletal: Pelvis is intact.  No vertebral fractures. IMPRESSION: 1. Right pneumothorax, 5% of lung volume. 2. Right chest wall subcutaneous emphysema. 3. Right costochondral fractures. 4. Small anterior mediastinal hematoma. 5. No evidence for great vessel injury or acute intra-abdominal injury. 6. Bilateral renal cysts. 7. Right inguinal hernia contains nondilated loops of small bowel. 8. Colonic diverticulosis. 9. Prostatic hypertrophy. Electronically Signed   By: Nolon Nations M.D.   On: 08/13/2016 17:17   Ct Cervical Spine Wo Contrast  Result Date: 08/13/2016 CLINICAL DATA:  Fall from 25 feet, chest pain and lower back pain. EXAM: CT HEAD WITHOUT CONTRAST CT CERVICAL SPINE WITHOUT CONTRAST TECHNIQUE: Multidetector CT imaging of the head and cervical spine was performed following the standard protocol without intravenous contrast. Multiplanar CT image reconstructions of the cervical spine were also generated. COMPARISON:  None. FINDINGS: CT HEAD FINDINGS Brain: There is mild generalized parenchymal atrophy with commensurate dilatation of the ventricles and sulci.  Mild chronic small vessel ischemic change noted within the periventricular white matter regions bilaterally. There is no mass,  hemorrhage, edema or other evidence of acute parenchymal abnormality. No extra-axial hemorrhage. Vascular: There are chronic calcified atherosclerotic changes of the large vessels at the skull base. No unexpected hyperdense vessel. Skull: Normal. Negative for fracture or focal lesion. Sinuses/Orbits: No acute finding. Other: None. CT CERVICAL SPINE FINDINGS Alignment: Alignment is normal.  No subluxation. Skull base and vertebrae: No fracture line or displaced fracture fragment identified. Facet joints appear intact and normally aligned throughout. Soft tissues and spinal canal: No prevertebral fluid or swelling. No visible canal hematoma. Disc levels: Degenerative changes throughout the cervical spine, mild to moderate in degree, most pronounced at the C6-7 level, no more than mild central canal stenosis appreciated at any level. Upper chest: Unremarkable. Atherosclerotic changes of the aortic arch. Surgical changes at the level of the right thyroid lobe. Other: None. IMPRESSION: 1. No acute intracranial abnormality. No intracranial mass, hemorrhage or edema. No skull fracture. Mild chronic small vessel ischemic change within the white matter. 2. No fracture or acute subluxation within the cervical spine. Degenerative changes of the cervical spine, as detailed above. 3. Aortic atherosclerosis. Electronically Signed   By: Franki Cabot M.D.   On: 08/13/2016 16:25   Ct Abdomen Pelvis W Contrast  Result Date: 08/13/2016 CLINICAL DATA:  Level 2 trauma. Fell 25 feet from a tree. Unsure of loss of consciousness. Chest pain a lower back pain. EXAM: CT CHEST, ABDOMEN, AND PELVIS WITH CONTRAST TECHNIQUE: Multidetector CT imaging of the chest, abdomen and pelvis was performed following the standard protocol during bolus administration of intravenous contrast. CONTRAST:  66mL ISOVUE-300 IOPAMIDOL (ISOVUE-300) INJECTION 61% COMPARISON:  08/13/2016 plain films, CT abdomen and pelvis on 08/19/2011 FINDINGS: CT CHEST FINDINGS  Cardiovascular: Heart size is normal. There is three-vessel coronary artery disease. No pericardial effusion. Aortic valve calcification. There is atherosclerotic calcification of the thoracic aorta not associated with aneurysm or dissection. Mediastinum/Nodes: Small anterior mediastinal hematoma at the level of the pericardial fat pad. Status post right thyroidectomy. No mediastinal or hilar adenopathy. Lungs/Pleura: There is a right-sided pneumothorax, estimated to be 5% of lung volume, seen primarily along the medial aspect of the lung. Right chest wall subcutaneous gas is present primarily along the anterior aspect of the chest. There is a small right pleural effusion. Minimal subsegmental atelectasis identified at the right lung base. Within the right lower lobe a pulmonary nodule measures 6 mm on image 92 of series 5. Within the left upper lobe a nodule measures 6 mm on image 50 of series 5. Musculoskeletal: Significant ecchymosis and edema of the anterior chest wall. There are fractures of at least 3 right costochondral junctions 4, 5, and 6. However the sternum is intact. Changes of diffuse idiopathic skeletal hyperostosis of the thoracic spine. No acute thoracic vertebral fractures. No acute displaced rib fractures. CT ABDOMEN PELVIS FINDINGS Hepatobiliary: Hepatic granulomata are present. The liver is homogeneous otherwise. Gallbladder is present. Pancreas: Unremarkable. No pancreatic ductal dilatation or surrounding inflammatory changes. Spleen: No splenic injury or perisplenic hematoma. Adrenals/Urinary Tract: The adrenal glands are normal in appearance. There are bilateral renal cysts. No hydronephrosis or evidence for acute renal injury . Stomach/Bowel: The stomach is normal in appearance. The appendix is well seen and has a normal appearance. Nondilated loops of small bowel are identified within a right inguinal hernia, showing no evidence for obstruction or compromise. Extensive colonic  diverticulosis, primarily involving the sigmoid colon. No  associated inflammatory changes. Vascular/Lymphatic: Atherosclerotic calcification of the abdominal aorta and its branches. No retroperitoneal or mesenteric adenopathy. Reproductive: Prostate gland is prominent and projects into the inferior aspect of the bladder. Other: Right inguinal hernia containing mesenteric fat and nondilated loops of small bowel. Musculoskeletal: Pelvis is intact.  No vertebral fractures. IMPRESSION: 1. Right pneumothorax, 5% of lung volume. 2. Right chest wall subcutaneous emphysema. 3. Right costochondral fractures. 4. Small anterior mediastinal hematoma. 5. No evidence for great vessel injury or acute intra-abdominal injury. 6. Bilateral renal cysts. 7. Right inguinal hernia contains nondilated loops of small bowel. 8. Colonic diverticulosis. 9. Prostatic hypertrophy. Electronically Signed   By: Nolon Nations M.D.   On: 08/13/2016 17:17   Dg Pelvis Portable  Result Date: 08/13/2016 CLINICAL DATA:  Fall from tree 25 feet. EXAM: PORTABLE PELVIS 1-2 VIEWS COMPARISON:  None. FINDINGS: There is no evidence of pelvic fracture or diastasis. No pelvic bone lesions are seen. IMPRESSION: Negative. Electronically Signed   By: Franki Cabot M.D.   On: 08/13/2016 16:21   Dg Chest Port 1 View  Result Date: 08/15/2016 CLINICAL DATA:  Pneumothorax EXAM: PORTABLE CHEST 1 VIEW COMPARISON:  08/14/2016 FINDINGS: Normal heart size. Gas along the left heart border are favored represent pneumomediastinum is stable. Emphysema throughout the soft tissues of the chest wall and right supraclavicular region are stable. Lungs are under aerated and grossly clear. There is gas along the upper right heart border but no definite pneumothorax. IMPRESSION: Stable pneumomediastinum.  No definite pneumothorax. Electronically Signed   By: Marybelle Killings M.D.   On: 08/15/2016 07:57   Dg Chest Port 1 View  Result Date: 08/14/2016 CLINICAL DATA:  Pneumothorax  EXAM: PORTABLE CHEST 1 VIEW COMPARISON:  08/13/2016 FINDINGS: Extensive right chest wall emphysema. Possible residual medial right pneumothorax adjacent to the pericardial fat pad. Pneumomediastinum is present. Lungs are otherwise clear. IMPRESSION: Interval development of pneumomediastinum. Significant right chest wall emphysema. Possible small anterior right pneumothorax. Electronically Signed   By: Nolon Nations M.D.   On: 08/14/2016 08:23   Dg Chest Port 1 View  Result Date: 08/13/2016 CLINICAL DATA:  Level 2 trauma. Patient fell from a tree 25 ft EXAM: PORTABLE CHEST 1 VIEW COMPARISON:  04/29/2015 FINDINGS: Cardiomediastinal silhouette is accentuated by the AP portable position of the patient. CT of the chest is performed on the same day. There is subcutaneous gas in the right chest wall. No definite pneumothorax identified on plain film. No acute displaced fractures. IMPRESSION: 1. Right chest wall subcutaneous gas. 2. No definite pneumothorax. 3. Mediastinal contour accentuated by the portable exam. CT of the chest will be performed later today. Electronically Signed   By: Nolon Nations M.D.   On: 08/13/2016 16:19    Anti-infectives: Anti-infectives    None      Assessment/Plan: s/p  Physical therapy and occupational therapy  Mobilize Changed pain medications.  LOS: 2 days   Kathryne Eriksson. Dahlia Bailiff, MD, FACS [phone] Trauma Surgeon 08/15/2016

## 2016-08-15 NOTE — Evaluation (Signed)
Clinical/Bedside Swallow Evaluation Patient Details  Name: Elijah Herron MRN: [id number] Date of Birth: [birth year]  Today's Date: 08/15/2016 Time: SLP Start Time (ACUTE ONLY): 1315 SLP Stop Time (ACUTE ONLY): 1324 SLP Time Calculation (min) (ACUTE ONLY): 9 min  Past Medical History:  Past Medical History:  Diagnosis Date  . At risk for sleep apnea    STOP-BANG= 4    SENT TO PCP 04-04-2014  . BPH (benign prostatic hypertrophy)   . Depression   . Diverticulosis of colon   . GERD (gastroesophageal reflux disease)   . Gilbert's syndrome   . Headache    migraines  . Hearing loss   . Heart murmur    "Calcified aortic valve"  . Hepatitis    Hep A as a child  . History of gastroenteritis   . History of kidney stones   . History of syncope    2002--  vasovagal  . HOH (hard of hearing)    refuses to wear his Hearing Aid  . Hypertension   . Inguinal hernia    right  . Internal hemorrhoid, bleeding   . Moderate aortic stenosis    a. Last echo 11/2013, 06/2014.  Marland Kitchen Nocturia   . Normal coronary arteries    a. Cath 07/2010: normal coronaries. Felt to have noncardiac CP/SOB at that time possibly r/t anxiety.  . RA (rheumatoid arthritis) ([place])    bilateral hands/ wrist--  seronegative  . Rotator cuff syndrome of right shoulder 08/01/2013  . Shortness of breath dyspnea    with exertion  . Stone, kidney   . Thyroid cyst   . Thyroid nodule    noted 11-2009  . Wears dentures    Past Surgical History:  Past Surgical History:  Procedure Laterality Date  . CARDIAC CATHETERIZATION  07-19-2010  dr Tressia Miners turner   normal coronary arteries,  ef 60%,  moderate aortic stenosis- gradient 12mmHg, normal right heart pressure  . CARDIOVASCULAR STRESS TEST  05/ 2011   dr Marlou Porch   normal low risk perfusion study  . CATARACT EXTRACTION W/ INTRAOCULAR LENS  IMPLANT, BILATERAL  2013  . COLONOSCOPY  2010  approx  . THYROID LOBECTOMY Right 05/05/2015   Procedure: RIGHT THYROID LOBECTOMY;  Surgeon: Armandina Gemma, MD;  Location: [facility];  Service: General;  Laterality: Right;  . TRANSANAL HEMORRHOIDAL DEARTERIALIZATION N/A 04/09/2014   Procedure: TRANSANAL HEMORRHOIDAL DEARTERIALIZATION OF INTERNAL HEMORROIDS;  Surgeon: Leighton Ruff, MD;  Location: [facility];  Service: General;  Laterality: N/A;  . TRANSTHORACIC ECHOCARDIOGRAM  11-26-2013   moderate focal basal and mild LVH/  ef [id number]  grade I diastolic dysfunction/ mild LAE/  moderate calcification with stenosis AV with mild regurg,  gradients 35 abd 58 mmHg /  mild TR   HPI:  88yom s/p fall from tree. Through his daughter he climbed about 25 feet then lost his stand. While trying to climb down he fell from about 3 meters. Sustained right rib fx, pneumothorax, costochondral fx, small anterior med hematoma. Ct chest on arrival shows There is a right-sided pneumothorax, estimated to be 5% of lung volume, seen primarily along the medial aspect of the lung. Right chest wall subcutaneous gas is present primarily along the anterior aspect of the chest. There is a small right pleural effusion. Minimal subsegmental atelectasis identified at the right lung base. Within the right lower lobe a pulmonary nodule measures 6 mm. Family reproted to MD that pt does not drink much due to fear of choking, frequent choking. UGI  on 2007 showed dysmotility, hypertrophied cricopharyngeus muscle.    Assessment / Plan / Recommendation Clinical Impression  Evaluation with pt very brief due to pt refusal to try more than only a few sips of Gatorade. Pt did have one soft cough; pt is fearful of coughing and tries to resist the response due to pain. Pt (via family interpreting) reports occasional globus but no significant dysphagia suggsting high risk of aspiration. Pt refuses to swallow anything if he is not fully upright and even then intake is minimal, but pt is unable to be repositioned for POs unless pain is well controlled.  Recommend pt continue being  offered POs of choice with basic precautions, primarily upright positioning. By the time of assessment family was no longer available to interpret and so education and recommendations could not be given. Will plan to f/u tomorrow to observe pt with a meal with family present. Discussed with RN.  SLP Visit Diagnosis: Dysphagia, unspecified (R13.10)    Aspiration Risk  Mild aspiration risk    Diet Recommendation Regular;Thin liquid   Liquid Administration via: Cup;Straw Medication Administration: Whole meds with liquid Supervision: Staff to assist with self feeding Compensations: Slow rate;Small sips/bites Postural Changes: Seated upright at 90 degrees;Remain upright for at least 30 minutes after po intake    Other  Recommendations Oral Care Recommendations: Oral care BID   Follow up Recommendations 24 hour supervision/assistance      Frequency and Duration min 2x/week  2 weeks       Prognosis Prognosis for Safe Diet Advancement: Good      Swallow Study   General HPI: 89yom s/p fall from tree. Through his daughter he climbed about 25 feet then lost his stand. While trying to climb down he fell from about 3 meters. Sustained right rib fx, pneumothorax, costochondral fx, small anterior med hematoma. Ct chest on arrival shows There is a right-sided pneumothorax, estimated to be 5% of lung volume, seen primarily along the medial aspect of the lung. Right chest wall subcutaneous gas is present primarily along the anterior aspect of the chest. There is a small right pleural effusion. Minimal subsegmental atelectasis identified at the right lung base. Within the right lower lobe a pulmonary nodule measures 6 mm. Family reproted to MD that pt does not drink much due to fear of choking, frequent choking. UGI on 2007 showed dysmotility, hypertrophied cricopharyngeus muscle.  Type of Study: Bedside Swallow Evaluation Previous Swallow Assessment: none Diet Prior to this Study: Regular;Thin  liquids Temperature Spikes Noted: No Respiratory Status: Room air History of Recent Intubation: No Behavior/Cognition: Alert;Cooperative Oral Cavity Assessment: Within Functional Limits Oral Care Completed by SLP: No Oral Cavity - Dentition: Adequate natural dentition Vision: Functional for self-feeding Self-Feeding Abilities: Able to feed self Patient Positioning: Upright in bed Baseline Vocal Quality: Normal Volitional Cough: Weak (due to pain) Volitional Swallow: Able to elicit    Oral/Motor/Sensory Function Overall Oral Motor/Sensory Function: Within functional limits   Ice Chips     Thin Liquid Thin Liquid: Impaired Presentation: Straw;Self Fed Oral Phase Functional Implications: Left anterior spillage Pharyngeal  Phase Impairments: Cough - Immediate    Nectar Thick Nectar Thick Liquid: Not tested   Honey Thick Honey Thick Liquid: Not tested   Puree Puree: Not tested   Solid   GO   Solid: Not tested       Herbie [place], MA CCC-SLP [id number]  Leonda Cristo, Katherene Ponto 08/15/2016,2:00 PM

## 2016-08-15 NOTE — Progress Notes (Signed)
PT Cancellation Note  Patient Details Name: [name] MRN: [id number] DOB: [birth year]   Cancelled Treatment:    Reason Eval/Treat Not Completed: Other (comment). Checked on twice and first time pt just up to chair and now just back to bed. Will reattempt tomorrow.   Shary Decamp [place] 08/15/2016, 2:05 PM [employer] PT [phone]

## 2016-08-16 LAB — BASIC METABOLIC PANEL
ANION GAP: 9 (ref 5–15)
BUN: 16 mg/dL (ref 6–20)
CALCIUM: 8.5 mg/dL — AB (ref 8.9–10.3)
CO2: 27 mmol/L (ref 22–32)
CREATININE: 0.98 mg/dL (ref 0.61–1.24)
Chloride: 101 mmol/L (ref 101–111)
GFR calc non Af Amer: >60 mL/min (ref >60)
Glucose, Bld: 118 mg/dL — ABNORMAL HIGH (ref 65–99)
Potassium: 3.2 mmol/L — ABNORMAL LOW (ref 3.5–5.1)
SODIUM: 137 mmol/L (ref 135–145)

## 2016-08-16 MED ORDER — TRAMADOL HCL 50 MG PO TABS
50.0000 mg | ORAL_TABLET | Freq: Four times a day (QID) | ORAL | Status: DC
  Administered 2016-08-16 – 2016-08-28 (×39): 50 mg via ORAL
  Filled 2016-08-16 (×40): qty 1

## 2016-08-16 MED ORDER — SODIUM CHLORIDE 0.9 % IV SOLN
30.0000 meq | Freq: Once | INTRAVENOUS | Status: AC
  Administered 2016-08-16: 30 meq via INTRAVENOUS
  Filled 2016-08-16: qty 15

## 2016-08-16 MED ORDER — SODIUM CHLORIDE 0.9 % IV SOLN
INTRAVENOUS | Status: DC
  Administered 2016-08-16: 10:00:00 via INTRAVENOUS
  Filled 2016-08-16 (×3): qty 1000

## 2016-08-16 MED ORDER — KETOROLAC TROMETHAMINE 15 MG/ML IJ SOLN
15.0000 mg | Freq: Three times a day (TID) | INTRAMUSCULAR | Status: AC
  Administered 2016-08-16 – 2016-08-17 (×6): 15 mg via INTRAVENOUS
  Filled 2016-08-16 (×6): qty 1

## 2016-08-16 NOTE — Progress Notes (Signed)
Patient still refuses to swallow his night meds because he want sit up to take them and he is scared he will choke in semi-fowlers position. Nurse explain the importance of medication and patient states again "not tonight".  Patient family wants a IV fluids to be started on him. There is no order for continuous fluids, I will pass inform day shift nurse so she can alert MD about concern for fluid intake.

## 2016-08-16 NOTE — Progress Notes (Signed)
  Subjective: Patient continues to complain of pain but when questioned about pain medications he states he does not want any more pain medications. He refused medications last night per nursing. No family present at bedside during assessment.   Objective: Vital signs in last 24 hours: Temp:  [97.7 F (36.5 C)-98.3 F (36.8 C)] 97.7 F (36.5 C) (03/06 0728) Pulse Rate:  [56-68] 68 (03/06 0728) Resp:  [14-27] 14 (03/06 0728) BP: (107-151)/(51-74) 151/58 (03/06 0728) SpO2:  [91 %-97 %] 96 % (03/06 0728) Last BM Date: 08/13/16  Intake/Output from previous day: 03/05 0701 - 03/06 0700 In: 1020 [P.O.:1020] Out: 350 [Urine:350] Intake/Output this shift: Total I/O In: -  Out: 50 [Urine:50]  General appearance: alert and cooperative Neck: subcutaneous emphysema felt to right shoulder and neck, no adenopathy, no carotid bruit, no JVD, supple, symmetrical, trachea midline,   Resp: fine crackles to right upper lobe, diminished breath sounds bilaterally Cardio: regular rate and rhythm, S1, S2 normal, no murmur, click, rub or gallop GI: soft, non-tender; bowel sounds hypoactive;  Extremities: extremities normal, atraumatic, no cyanosis or edema  Lab Results:   Recent Labs  08/13/16 1530 08/13/16 1543 08/14/16 0211  WBC 13.1*  --  7.4  HGB 11.9* 12.9* 11.0*  HCT 36.1* 38.0* 32.9*  PLT 156  --  135*   BMET  Recent Labs  08/14/16 0211 08/16/16 0357  NA 138 137  K 3.6 3.2*  CL 105 101  CO2 26 27  GLUCOSE 115* 118*  BUN 16 16  CREATININE 1.11 0.98  CALCIUM 8.5* 8.5*   PT/INR  Recent Labs  08/13/16 1530  LABPROT 14.5  INR 1.12   ABG No results for input(s): PHART, HCO3 in the last 72 hours.  Invalid input(s): PCO2, PO2  Studies/Results: Dg Chest Port 1 View  Result Date: 08/15/2016 CLINICAL DATA:  Pneumothorax EXAM: PORTABLE CHEST 1 VIEW COMPARISON:  08/14/2016 FINDINGS: Normal heart size. Gas along the left heart border are favored represent pneumomediastinum  is stable. Emphysema throughout the soft tissues of the chest wall and right supraclavicular region are stable. Lungs are under aerated and grossly clear. There is gas along the upper right heart border but no definite pneumothorax. IMPRESSION: Stable pneumomediastinum.  No definite pneumothorax. Electronically Signed   By: Marybelle Killings M.D.   On: 08/15/2016 07:57    Anti-infectives: Anti-infectives    None      Assessment/Plan: 20-25 foot fall from tree Right PTX with subcutaneous emphysema- chest x-ray today show stable pneumomediastinum. Continue pulmonary toilet, IS use, and  repeat chest x-ray in am  Small anterior mediastinal hematoma- stable Right ingunial hernia containing loops of small bowel and colonic diverticulosis-  Incidental finding on CT   Constipation- Scheduled and PRN stool softeners and laxatives ordered  VTE- Lovenox, SCD FEN- Begin IVF, encourage oral intake, speech therapy to follow  Pain- Morphine 1mg  every 2hrs PRN, Oxycodone 5mg  every 4hrs PRN, Tramadol 50mg  every 6hrs, Ketorolac 15mg  every 8 hrs, Ibuprofen 800mg  every 8hrs PRN, Cyclobenaprine 5mg  3 times daily Acetaminophen 650mg  every 6hrs  Plan- PT/OT, mobilize, pain control   LOS: 3 days    Merlene Laughter 08/16/2016

## 2016-08-16 NOTE — Care Management Note (Signed)
Case Management Note  Patient Details  Name: Nathanuel Jude MRN: [id number] Date of Birth: 1928/03/20  Subjective/Objective:   Pt admitted on 08/13/16 after 25 foot fall from a tree; he sustained RT rib fx and small RT PTX.  PTA, pt independent, lives alone; his daughter lives in the house next to him.                 Action/Plan: PT/OT recommending HH follow up, RW, and 3 in 1 BSC.  Pt's daughter from out of town will stay with pt until 3/17 to provide 24hr assistance.    Expected Discharge Date:                  Expected Discharge Plan:  [place]  In-House Referral:     Discharge planning Services  CM Consult  Post Acute Care Choice:    Choice offered to:     DME Arranged:    DME Agency:     HH Arranged:    [place] Agency:     Status of Service:  In process, will continue to follow  If discussed at Long Length of Stay Meetings, dates discussed:    Additional Comments:  Reinaldo Raddle, RN, BSN  Trauma/Neuro ICU Case Manager [phone]

## 2016-08-16 NOTE — Evaluation (Signed)
Physical Therapy Evaluation Patient Details Name: [name] MRN: [id number] DOB: May 04, 1928 Today's Date: 08/16/2016   History of Present Illness  Pt adm after 25 foot fall from a tree and suffered rt rib fx's and small rt pneumothorax. PMH - HTN, RA, rt rotator cuff problems, Hep A, and Depression.  Clinical Impression  Pt reluctant for ambulation, but did better than anticipated.  Pt indicates R rib pain limits mobility at this time.  Pt's daughter lives with him and can stay home with him until March 17th, then his other daughter can check on pt.  Feel pt is safe for return to home with family support and will need HHPT.      Follow Up Recommendations Home health PT;Supervision/Assistance - 24 hour    Equipment Recommendations  Rolling walker with 5" wheels;3in1 (PT)    Recommendations for Other Services       Precautions / Restrictions Precautions Precautions: Fall Restrictions Weight Bearing Restrictions: No      Mobility  Bed Mobility               General bed mobility comments: pt received in chair  Transfers Overall transfer level: Needs assistance Equipment used: Rolling walker (2 wheeled) Transfers: Sit to/from Stand Sit to Stand: Min guard         General transfer comment: increased time, min guard assist for safety  Ambulation/Gait Ambulation/Gait assistance: Min guard Ambulation Distance (Feet): 80 Feet Assistive device: Rolling walker (2 wheeled) Gait Pattern/deviations: Step-through pattern;Decreased stride length;Trunk flexed     General Gait Details: pt moves very slowly and cautiously due to rib pain with movement and increased WOB with movement.    Stairs            Wheelchair Mobility    Modified Rankin (Stroke Patients Only)       Balance Overall balance assessment: Needs assistance Sitting-balance support: No upper extremity supported;Feet supported Sitting balance-Leahy Scale: Fair     Standing balance support:  Bilateral upper extremity supported;Single extremity supported;During functional activity Standing balance-Leahy Scale: Poor                               Pertinent Vitals/Pain Pain Assessment: Faces Faces Pain Scale: Hurts even more Pain Location: R ribs Pain Descriptors / Indicators: Grimacing;Sore;Guarding Pain Intervention(s): Monitored during session;Premedicated before session;Repositioned    Home Living Family/patient expects to be discharged to:: Private residence Living Arrangements: Children (daughter who works most of time in [place]) Available Help at Discharge: Family;Available 24 hours/day (until 3/17 and then intermittently) Type of Home: House Home Access: Stairs to enter   [employer] of Steps: 2 Home Layout: One level Home Equipment: Grab bars - tub/shower      Prior Function Level of Independence: Independent         Comments: drives     Hand Dominance   Dominant Hand: Right    Extremity/Trunk Assessment   Upper Extremity Assessment Upper Extremity Assessment: Defer to OT evaluation    Lower Extremity Assessment Lower Extremity Assessment: Generalized weakness       Communication   Communication: Prefers language other than [place] ([place], but communicates in [place])  Cognition Arousal/Alertness: Awake/alert Behavior During Therapy: WFL for tasks assessed/performed Overall Cognitive Status: Within Functional Limits for tasks assessed                      General Comments      Exercises  Assessment/Plan    PT Assessment Patient needs continued PT services  PT Problem List Decreased strength;Decreased activity tolerance;Decreased balance;Decreased mobility;Decreased knowledge of use of DME;Cardiopulmonary status limiting activity;Pain       PT Treatment Interventions DME instruction;Gait training;Stair training;Functional mobility training;Therapeutic activities;Therapeutic exercise;Balance  training;Patient/family education    PT Goals (Current goals can be found in the Care Plan section)  Acute Rehab PT Goals Patient Stated Goal: to return home PT Goal Formulation: With patient Time For Goal Achievement: 08/30/16 Potential to Achieve Goals: Good    Frequency Min 3X/week   Barriers to discharge        Co-evaluation PT/OT/SLP Co-Evaluation/Treatment: Yes Reason for Co-Treatment: Other (comment) (pt pain tolerance) PT goals addressed during session: Mobility/safety with mobility;Balance;Proper use of DME OT goals addressed during session: ADL's and self-care       End of Session Equipment Utilized During Treatment: Gait belt Activity Tolerance: Patient tolerated treatment well Patient left: in chair;with call bell/phone within reach Nurse Communication: Mobility status PT Visit Diagnosis: Unsteadiness on feet (R26.81);Pain Pain - Right/Left: Right Pain - part of body:  (Ribs)         Time: [id number] PT Time Calculation (min) (ACUTE ONLY): 31 min   Charges:   PT Evaluation $PT Eval Moderate Complexity: 1 Procedure     PT G CodesThornton Papas Jolissa Kapral, PT  [phone] 08/16/2016, 2:42 PM

## 2016-08-16 NOTE — Evaluation (Signed)
Occupational Therapy Evaluation Patient Details Name: Antonius Strasburger MRN: [id number] DOB: 1927-10-29 Today's Date: 08/16/2016    History of Present Illness Pt adm after 25 foot fall from a tree and suffered rt rib fx's and small rt pneumothorax. PMH - HTN, RA, rt rotator cuff problems.   Clinical Impression   Pt was independent in ADL, IADL and mobility prior to admission. Presents with R flank pain and unsteady gait interfering with ability to perform ADL and ADL transfers. Pt's daughter arrived during session and reports pt has been known to have a drop in blood pressure when using the bathroom at night. Gave pt urinals for home and instructed to use at night, pt agreeable. Pt with inability to perform LB bathing and dressing due to pain, requires min assist and use of walker for mobility. Will follow acutely.    Follow Up Recommendations  Home health OT;Supervision/Assistance - 24 hour (initially)    Equipment Recommendations  3 in 1 bedside commode    Recommendations for Other Services       Precautions / Restrictions Precautions Precautions: Fall Restrictions Weight Bearing Restrictions: No      Mobility Bed Mobility               General bed mobility comments: pt received in chair  Transfers Overall transfer level: Needs assistance Equipment used: Rolling walker (2 wheeled) Transfers: Sit to/from Stand Sit to Stand: Min guard         General transfer comment: increased time, min guard assist for safety    Balance Overall balance assessment: Needs assistance   Sitting balance-Leahy Scale: Fair       Standing balance-Leahy Scale: Poor                              ADL Overall ADL's : Needs assistance/impaired Eating/Feeding: Independent;Sitting   Grooming: Min guard;Standing   Upper Body Bathing: Set up;Sitting   Lower Body Bathing: Maximal assistance;Sit to/from stand   Upper Body Dressing : Minimal assistance;Sitting   Lower Body  Dressing: Maximal assistance;Sit to/from stand   Toilet Transfer: Minimal assistance;Ambulation;RW   Toileting- Clothing Manipulation and Hygiene: Minimal assistance;Sit to/from stand       Functional mobility during ADLs: Minimal assistance;Rolling walker       Vision Baseline Vision/History: Wears glasses Wears Glasses: Distance only Patient Visual Report: No change from baseline       Perception     Praxis      Pertinent Vitals/Pain Pain Assessment: Faces Faces Pain Scale: Hurts even more Pain Location: R ribs Pain Descriptors / Indicators: Grimacing;Sore;Guarding Pain Intervention(s): Monitored during session;Premedicated before session;Repositioned     Hand Dominance Right   Extremity/Trunk Assessment Upper Extremity Assessment Upper Extremity Assessment: Overall WFL for tasks assessed   Lower Extremity Assessment Lower Extremity Assessment: Defer to PT evaluation       Communication Communication Communication: Prefers language other than English ([place])   Cognition Arousal/Alertness: Awake/alert Behavior During Therapy: WFL for tasks assessed/performed Overall Cognitive Status: Within Functional Limits for tasks assessed                     General Comments       Exercises       Shoulder Instructions      Home Living Family/patient expects to be discharged to:: Private residence Living Arrangements: Children (daughter who works most of time in [place]) Available Help at Discharge: Family;Available 24 hours/day (until  3/17 and then intermittently) Type of Home: House Home Access: Stairs to enter [employer] of Steps: 2   Home Layout: One level     Bathroom Shower/Tub: Teacher, early years/pre: Handicapped height     Home Equipment: Grab bars - tub/shower          Prior Functioning/Environment Level of Independence: Independent        Comments: drives        OT Problem List: Decreased activity  tolerance;Impaired balance (sitting and/or standing);Decreased knowledge of use of DME or AE;Pain      OT Treatment/Interventions: Self-care/ADL training;DME and/or AE instruction;Therapeutic activities;Balance training;Patient/family education    OT Goals(Current goals can be found in the care plan section) Acute Rehab OT Goals Patient Stated Goal: to return home OT Goal Formulation: With patient Time For Goal Achievement: 08/23/16 Potential to Achieve Goals: Good ADL Goals Pt Will Perform Grooming: with supervision;standing Pt Will Perform Lower Body Bathing: with supervision;with adaptive equipment;sit to/from stand Pt Will Perform Lower Body Dressing: with supervision;with adaptive equipment;sit to/from stand Pt Will Transfer to Toilet: with supervision;ambulating;bedside commode (over toilet) Pt Will Perform Toileting - Clothing Manipulation and hygiene: with supervision;sit to/from stand Additional ADL Goal #1: Pt will perform bed mobility with supervision.  OT Frequency: Min 2X/week   Barriers to D/C:            Co-evaluation PT/OT/SLP Co-Evaluation/Treatment: Yes Reason for Co-Treatment: For patient/therapist safety   OT goals addressed during session: ADL's and self-care      End of Session Equipment Utilized During Treatment: Rolling walker;Gait belt  Activity Tolerance: Patient tolerated treatment well Patient left: in chair;with call bell/phone within reach;with family/visitor present  OT Visit Diagnosis: Muscle weakness (generalized) (M62.81);Unsteadiness on feet (R26.81);Pain;Other abnormalities of gait and mobility (R26.89) Pain - Right/Left: Right Pain - part of body:  (rib)                ADL either performed or assessed with clinical judgement  Time: 1140-1213 OT Time Calculation (min): 33 min Charges:  OT General Charges $OT Visit: 1 Procedure OT Evaluation $OT Eval Moderate Complexity: 1 Procedure G-Codes:     Malka So 08/16/2016, 1:30  PM  [phone]

## 2016-08-16 NOTE — Progress Notes (Signed)
  Speech Language Pathology Treatment: Dysphagia  Patient Details Name: [name] MRN: [id number] DOB: Oct 02, 1927 Today's Date: 08/16/2016 Time: [id number] SLP Time Calculation (min) (ACUTE ONLY): 15 min  Assessment / Plan / Recommendation Clinical Impression  Upon arrival pt laying flat in bed eating sausage from breakfast tray. SLP assisted pt to upright position with minimal grimacing from pt. Niece arrived to assist in interpreting. Pt observed to consume regular solid and thin liquids in an upright position with no signs of aspiration. However, pt and niece do report pt has been observed to cough when positioning is not optimal (head tilted back for example) and this makes pt fearful due to pain with coughing. SLP advised pt and niece on tilting the bed for chair positioning and trials a chin tuck to encourage potentially safer head positioning.  Aspiration risk is mild, greater concern is adequate intake. Will f/u to reinforce suggestions with pt and family and ensure tolerance of diet given possible reports of a baseline cricopharyngeal/esophageal dysphagia.   HPI HPI: 44yom s/p fall from tree. Through his daughter he climbed about 25 feet then lost his stand. While trying to climb down he fell from about 3 meters. Sustained right rib fx, pneumothorax, costochondral fx, small anterior med hematoma. Ct chest on arrival shows There is a right-sided pneumothorax, estimated to be 5% of lung volume, seen primarily along the medial aspect of the lung. Right chest wall subcutaneous gas is present primarily along the anterior aspect of the chest. There is a small right pleural effusion. Minimal subsegmental atelectasis identified at the right lung base. Within the right lower lobe a pulmonary nodule measures 6 mm. Family reproted to MD that pt does not drink much due to fear of choking, frequent choking. UGI on 2007 showed dysmotility, hypertrophied cricopharyngeus muscle.       SLP Plan  Continue  with current plan of care       Recommendations  Diet recommendations: Thin liquid;Regular Liquids provided via: Cup;Straw Medication Administration: Whole meds with liquid Supervision: Patient able to self feed Compensations: Slow rate;Small sips/bites;Chin tuck Postural Changes and/or Swallow Maneuvers: Seated upright 90 degrees                Plan: Continue with current plan of care       GO               [facility], MA CCC-SLP [id number]  Lynann Beaver 08/16/2016, 11:39 AM

## 2016-08-17 LAB — BASIC METABOLIC PANEL
Anion gap: 6 (ref 5–15)
BUN: 21 mg/dL — AB (ref 6–20)
CO2: 26 mmol/L (ref 22–32)
CREATININE: 1.19 mg/dL (ref 0.61–1.24)
Calcium: 8.2 mg/dL — ABNORMAL LOW (ref 8.9–10.3)
Chloride: 105 mmol/L (ref 101–111)
GFR calc Af Amer: >60 mL/min (ref >60)
GFR calc non Af Amer: 53 mL/min — ABNORMAL LOW (ref >60)
GLUCOSE: 110 mg/dL — AB (ref 65–99)
Potassium: 4.4 mmol/L (ref 3.5–5.1)
Sodium: 137 mmol/L (ref 135–145)

## 2016-08-17 MED ORDER — MORPHINE SULFATE (PF) 2 MG/ML IV SOLN
1.0000 mg | INTRAVENOUS | Status: DC | PRN
  Administered 2016-08-17 – 2016-08-19 (×4): 1 mg via INTRAVENOUS
  Filled 2016-08-17 (×5): qty 1

## 2016-08-17 MED ORDER — FLEET ENEMA 7-19 GM/118ML RE ENEM
1.0000 | ENEMA | Freq: Once | RECTAL | Status: AC
  Administered 2016-08-17: 1 via RECTAL
  Filled 2016-08-17: qty 1

## 2016-08-17 MED ORDER — POTASSIUM CHLORIDE IN NACL 20-0.9 MEQ/L-% IV SOLN
INTRAVENOUS | Status: DC
  Administered 2016-08-18 – 2016-08-22 (×6): via INTRAVENOUS
  Filled 2016-08-17 (×7): qty 1000

## 2016-08-17 NOTE — Progress Notes (Signed)
Occupational Therapy Treatment Patient Details Name: [name] MRN: [id number] DOB: [birth year] Today's Date: 08/17/2016    History of present illness Pt adm after 25 foot fall from a tree and suffered rt rib fx's and small rt pneumothorax. PMH - HTN, RA, rt rotator cuff problems, Hep A, and Depression.   OT comments  Pt requiring min guard to min assist for mobility. Poor activity tolerance with grooming at sink in standing. Pt dependent in LB dressing, pain limiting. Will demonstrate AE next visit. Pt encouraged to remain in chair and walk with staff. He performed incentive spirometer x 10.  Follow Up Recommendations  Home health OT;Supervision/Assistance - 24 hour (initially)    Equipment Recommendations  3 in 1 bedside commode    Recommendations for Other Services      Precautions / Restrictions Precautions Precautions: Fall       Mobility Bed Mobility Overal bed mobility: Needs Assistance Bed Mobility: Supine to Sit     Supine to sit: Mod assist     General bed mobility comments: assist to raise trunk and advance hips to EOB, heavy use of rail  Transfers Overall transfer level: Needs assistance Equipment used: Rolling walker (2 wheeled) Transfers: Sit to/from Stand Sit to Stand: Min guard         General transfer comment: increased time, min guard assist for safety    Balance Overall balance assessment: Needs assistance   Sitting balance-Leahy Scale: Fair       Standing balance-Leahy Scale: Poor                     ADL Overall ADL's : Needs assistance/impaired     Grooming: Oral care;Wash/dry hands;Wash/dry face;Brushing hair;Sitting;Standing;Minimal assistance Grooming Details (indicate cue type and reason): pt fatiguing in standing after one activity, pulled chair up to sink to complete rest of actvities         Upper Body Dressing : Minimal assistance;Sitting Upper Body Dressing Details (indicate cue type and reason): front opening  gown   Lower Body Dressing Details (indicate cue type and reason): pt attempting to reach feet, but pain preventing             Functional mobility during ADLs: Minimal assistance;Rolling walker;Cueing for sequencing;Cueing for safety        Vision                     Perception     Praxis      Cognition   Behavior During Therapy: [facility] for tasks assessed/performed Overall Cognitive Status: Within Functional Limits for tasks assessed                         Exercises     Shoulder Instructions       General Comments      Pertinent Vitals/ Pain       Pain Assessment: Faces Faces Pain Scale: Hurts even more Pain Location: R ribs Pain Descriptors / Indicators: Grimacing;Sore;Guarding Pain Intervention(s): Monitored during session;Repositioned (splinted with pillow)  Home Living                                          Prior Functioning/Environment              Frequency  Min 2X/week        Progress Toward Goals  OT  Goals(current goals can now be found in the care plan section)  Progress towards OT goals: Progressing toward goals  Acute Rehab OT Goals Patient Stated Goal: to return home Time For Goal Achievement: 08/23/16 Potential to Achieve Goals: Good  Plan Discharge plan remains appropriate    Co-evaluation                 End of Session Equipment Utilized During Treatment: Rolling walker;Gait belt  OT Visit Diagnosis: Muscle weakness (generalized) (M62.81);Unsteadiness on feet (R26.81);Pain;Other abnormalities of gait and mobility (R26.89) Pain - Right/Left: Right Pain - part of body:  (ribs)   Activity Tolerance Patient limited by fatigue   Patient Left in chair;with call bell/phone within reach   Nurse Communication          Time: 3354-5625 OT Time Calculation (min): 24 min  Charges: OT General Charges $OT Visit: 1 Procedure OT Treatments $Self Care/Home Management : 23-37  mins    Malka So 08/17/2016, 9:57 AM  [phone]

## 2016-08-17 NOTE — Care Management Important Message (Signed)
Important Message  Patient Details  Name: [name] MRN: [id number] Date of Birth: [birth year]   Medicare Important Message Given:  Yes    Katriel Cutsforth 08/17/2016, 2:13 PM

## 2016-08-17 NOTE — Progress Notes (Signed)
Trauma Service Note  Subjective: Patient sitting up in the chair.  No distress but having expected pain.  No acute distress  Objective: Vital signs in last 24 hours: Temp:  [97.6 F (36.4 C)-98 F (36.7 C)] 97.8 F (36.6 C) (03/07 0830) Pulse Rate:  [50-73] 73 (03/07 0830) Resp:  [12-19] 18 (03/07 0830) BP: (110-162)/(51-77) 144/77 (03/07 0830) SpO2:  [93 %-96 %] 95 % (03/07 0830) Last BM Date: 08/13/16  Intake/Output from previous day: 03/06 0701 - 03/07 0700 In: 1065.8 [P.O.:180; I.V.:885.8] Out: 250 [Urine:250] Intake/Output this shift: Total I/O In: -  Out: 200 [Urine:200]  General: No acute distress but still has not had a bowel movement.  Lungs: Clear to auscultation.  Crepitance still palpable on the right chest.  Can feel and hear movement of broken ribs also.  Abd: More distended than yesterday.  Not tender.  E bowel sounds.  Extremities: No changes  Neuro: Intact  Lab Results: CBC  No results for input(s): WBC, HGB, HCT, PLT in the last 72 hours. BMET  Recent Labs  08/16/16 0357 08/17/16 0218  NA 137 137  K 3.2* 4.4  CL 101 105  CO2 27 26  GLUCOSE 118* 110*  BUN 16 21*  CREATININE 0.98 1.19  CALCIUM 8.5* 8.2*   PT/INR No results for input(s): LABPROT, INR in the last 72 hours. ABG No results for input(s): PHART, HCO3 in the last 72 hours.  Invalid input(s): PCO2, PO2  Studies/Results: No results found.  Anti-infectives: Anti-infectives    None      Assessment/Plan: s/p  Try to induce BM with Fleet enema.  He is a little more distended, may need KUB later  LOS: 4 days   Kathryne Eriksson. Dahlia Bailiff, MD, FACS [phone] Trauma Surgeon 08/17/2016

## 2016-08-17 NOTE — Progress Notes (Signed)
Physical Therapy Treatment Patient Details Name: [name] MRN: [id number] DOB: [birth year] Today's Date: 08/17/2016    History of Present Illness Pt adm after 25 foot fall from a tree and suffered rt rib fx's and small rt pneumothorax. PMH - HTN, RA, rt rotator cuff problems, Hep A, and Depression.    PT Comments    Pt limited secondary to pain and recent enema given; therefore, pt declining ambulation at this time. Pt performed sit-to-stand and stand-pivot transfer from recliner chair to bed with min guard for safety. All VSS throughout. Pt would continue to benefit from skilled physical therapy services at this time while admitted and after d/c to address his limitations in order to improve his overall safety and independence with functional mobility.     Follow Up Recommendations  Home health PT;Supervision/Assistance - 24 hour     Equipment Recommendations  Rolling walker with 5" wheels;3in1 (PT)    Recommendations for Other Services       Precautions / Restrictions Precautions Precautions: Fall Restrictions Weight Bearing Restrictions: No    Mobility  Bed Mobility Overal bed mobility: Needs Assistance Bed Mobility: Sit to Supine       Sit to supine: Min assist   General bed mobility comments: min A with bilateral LEs  Transfers Overall transfer level: Needs assistance Equipment used: Rolling walker (2 wheeled) Transfers: Sit to/from [facility] Sit to Stand: Min guard Stand pivot transfers: Min guard       General transfer comment: increased time, min guard assist for safety  Ambulation/Gait             General Gait Details: pt declining ambulation at this time secondary to back pain and recent enema   Stairs            Wheelchair Mobility    Modified Rankin (Stroke Patients Only)       Balance Overall balance assessment: Needs assistance Sitting-balance support: No upper extremity supported;Feet supported Sitting  balance-Leahy Scale: Fair     Standing balance support: No upper extremity supported;During functional activity Standing balance-Leahy Scale: Fair                      Cognition Arousal/Alertness: Awake/alert Behavior During Therapy: WFL for tasks assessed/performed Overall Cognitive Status: Within Functional Limits for tasks assessed                      Exercises Other Exercises Other Exercises: instructed pt in use of IS, x10 reps    General Comments        Pertinent Vitals/Pain Pain Assessment: Faces Faces Pain Scale: Hurts a little bit Pain Location: ribs Pain Descriptors / Indicators: Sore Pain Intervention(s): Monitored during session;Repositioned    Home Living                      Prior Function            PT Goals (current goals can now be found in the care plan section) Acute Rehab PT Goals Patient Stated Goal: to return home PT Goal Formulation: With patient Time For Goal Achievement: 08/30/16 Potential to Achieve Goals: Good Progress towards PT goals: Progressing toward goals    Frequency    Min 3X/week      PT Plan Current plan remains appropriate    Co-evaluation             End of Session   Activity Tolerance: Patient tolerated treatment  well Patient left: in bed;with call bell/phone within reach;with family/visitor present Nurse Communication: Mobility status PT Visit Diagnosis: Unsteadiness on feet (R26.81);Pain Pain - Right/Left: Right Pain - part of body:  (ribs)     Time: 1423-1440 PT Time Calculation (min) (ACUTE ONLY): 17 min  Charges:  $Therapeutic Activity: 8-22 mins                    G CodesClearnce Sorrel Eulanda Dorion Sep 04, 2016, 4:26 PM Sherie Don, PT, DPT [phone]

## 2016-08-18 ENCOUNTER — Encounter (HOSPITAL_COMMUNITY): Payer: Self-pay | Admitting: *Deleted

## 2016-08-18 LAB — BASIC METABOLIC PANEL
ANION GAP: 6 (ref 5–15)
BUN: 20 mg/dL (ref 6–20)
CHLORIDE: 107 mmol/L (ref 101–111)
CO2: 27 mmol/L (ref 22–32)
Calcium: 8.4 mg/dL — ABNORMAL LOW (ref 8.9–10.3)
Creatinine, Ser: 1.03 mg/dL (ref 0.61–1.24)
GFR calc Af Amer: >60 mL/min (ref >60)
GLUCOSE: 98 mg/dL (ref 65–99)
POTASSIUM: 4.6 mmol/L (ref 3.5–5.1)
Sodium: 140 mmol/L (ref 135–145)

## 2016-08-18 MED ORDER — OXYCODONE HCL 5 MG PO TABS: 5.0000 mg | ORAL_TABLET | Freq: Four times a day (QID) | ORAL | 0 refills | Status: DC | PRN

## 2016-08-18 MED ORDER — TRAMADOL HCL 50 MG PO TABS: 50.0000 mg | ORAL_TABLET | Freq: Four times a day (QID) | ORAL | 0 refills | Status: DC | PRN

## 2016-08-18 MED ORDER — ACETAMINOPHEN 325 MG PO TABS: 650.0000 mg | ORAL_TABLET | Freq: Four times a day (QID) | ORAL | Status: DC

## 2016-08-18 NOTE — Progress Notes (Signed)
  Subjective: Sitting up in chair this morning, reports some pain in his upper chest as expected but states pain is a bit better. In no acute distress.   Objective: Vital signs in last 24 hours: Temp:  [97.7 F (36.5 C)-98.7 F (37.1 C)] 98.1 F (36.7 C) (03/08 0812) Pulse Rate:  [54-60] 60 (03/08 0812) Resp:  [13-16] 14 (03/08 0812) BP: (122-154)/(53-73) 154/53 (03/08 0812) SpO2:  [93 %-96 %] 96 % (03/08 0812) Last BM Date: 08/17/16  Intake/Output from previous day: 03/07 0701 - 03/08 0700 In: 52 [P.O.:180; I.V.:600] Out: 700 [Urine:700] Intake/Output this shift: Total I/O In: 100 [I.V.:100] Out: -   Physical Exam  General appearance: alert and cooperative Neck: subcutaneous emphysema felt across upper chest and right neck, no JVD, supple, symmetrical, trachea midline,   Resp: subcutaneous emphysema hear to upper chest, clear breath sounds bilaterally Cardio: regular rate and rhythm, S1, S2 normal, no murmur, click, rub or gallop GI: soft, non-tender; bowel sounds hyperactive, LBM 08/17/16  Extremities: extremities normal, atraumatic, no cyanosis or edema  Lab Results:  No results for input(s): WBC, HGB, HCT, PLT in the last 72 hours. BMET  Recent Labs  08/17/16 0218 08/18/16 0342  NA 137 140  K 4.4 4.6  CL 105 107  CO2 26 27  GLUCOSE 110* 98  BUN 21* 20  CREATININE 1.19 1.03  CALCIUM 8.2* 8.4*   PT/INR No results for input(s): LABPROT, INR in the last 72 hours. ABG No results for input(s): PHART, HCO3 in the last 72 hours.  Invalid input(s): PCO2, PO2  Studies/Results: No results found.  Anti-infectives: Anti-infectives    None      Assessment/Plan: 20-25 foot fall from tree Right PTX with subcutaneous emphysema- chest x-ray today show stable pneumomediastinum. Continue pulmonary toilet, IS use Small anterior mediastinal hematoma- stable Right ingunial hernia containing loops of small bowel and colonic diverticulosis-  Incidental finding on CT    Constipation-  Resolved, large BM yesterday 08/17/16, positive bowel sounds to all quadrants   VTE- Lovenox, SCD FEN- Begin IVF, encourage oral intake, speech therapy to follow  Pain- Morphine 1mg  every 2hrs PRN, Oxycodone 5mg  every 4hrs PRN, Tramadol 50mg  every 6hrs, Ibuprofen 800mg  every 8hrs PRN, Cyclobenaprine 5mg  3 times daily Acetaminophen 650mg  every 6hrs  Disposition/Plan-  Transfer to floor, continue PT/OT, mobilize, pain control, possible Discharge tomorrow    LOS: 5 days    Merlene Laughter 08/18/2016

## 2016-08-18 NOTE — Discharge Summary (Signed)
[facility] Discharge Summary   Patient ID: [name] MRN: [id number] DOB/AGE: 1927-10-29 83 y.o.  Admit date: 08/13/2016 Discharge date: 08/28/2016  Admitting Diagnosis: Fall Multiple rib fractures, right Small pneumothorax, right Costochondral fracture  Consultants Internal medicine - Dr. Cristal Ford Interventional radiology - Dr. Sandi Mariscal [facility] Neurosurgery - Dr. Ashok Pall, Kristeen Miss   Imaging: 08/13/16 DG CHEST - Right chest wall subcutaneous gas. No definite pneumothorax. Mediastinal contour accentuated by the portable exam.   08/13/16 DG PELVIS - There is no evidence of pelvic fracture or diastasis. No pelvic bone lesions are seen.  08/13/16 CT HEAD/C-SPINE - No acute intracranial abnormality. No intracranial mass, hemorrhage or edema. No skull fracture. Mild chronic small vessel ischemic change within the white matter. 2. No fracture or acute subluxation within the cervical spine. Degenerative changes of the cervical spine, as detailed above. 3. Aortic atherosclerosis.  08/13/16 CT CHEST/ABD/PELVIS W/ - Right pneumothorax, 5% of lung volume. Right chest wall subcutaneous emphysema. Right costochondral fractures. Small anterior mediastinal hematoma. No evidence for great vessel injury or acute intra-abdominal injury. Bilateral renal cysts. Right inguinal hernia contains nondilated loops of small bowel. Colonic diverticulosis. Prostatic hypertrophy.  08/14/16 DG CHEST - Interval development of pneumomediastinum. Significant right chest wall emphysema. Possible small anterior right pneumothorax.  08/15/16 DG CHEST - Stable pneumomediastinum.  No definite pneumothorax.  08/21/16 CT ABDOMEN - 1. Pneumoperitoneum likely accounts for the drop in hemoglobin. 2. No discrete etiology of the pneumoperitoneum is evident. 3. Pneumomediastinum. 4. Extensive subcutaneous emphysema is most prominent at the right chest and extends into  the right abdomen. 5. Large right inguinal hernia contains fat and fluid, likely blood. Bowel is no longer present within hernia. 6. Atherosclerosis. 7. Degenerative changes of the lower lumbar spine. 8. Anterior costochondral fractures on the right.  08/24/16 CT CHEST - 1. No pneumothorax. 2. Bilateral small pleural effusions and medial basilar atelectasis. 3. Subcutaneous emphysema and pneumomediastinum not changed. 4. Posterior RIGHT seventh and eighth rib fractures. 5. BILATERAL SUBACUTE PEDICLE FRACTURES AT T10. These are UNSTABLE fractures. Recommend nonemergent neurosurgical consultation. 6. Probable superior endplate fracture S85.  08/24/16 MR THORACIC SPINE - IMPRESSION: Acute appearing T10-3 column fracture without height loss. Mild T11 acute compression fracture and edema extending to the posterior elements. Focally disrupted T10-11 ligaments. Low-grade lower thoracic paraspinal muscle strain.  Procedures 08/24/16 - Thoracentesis, Dr. Laverta Baltimore Course:  83 year old [place] male who presented to the emergency department after falling out of a tree. Unsure of loss of consciousness. Patient arrival to the ED vital signs were stable and GCS 15. At presentation complaining of right anterior chest pain and low back pain. Physical exam significant for right chest wall tenderness and right subcutaneous emphysema. Imaging significant for multiple right-sided chest fractures and costochondral fractures and small right pneumothorax. No chest tube was placed. Patient was admitted to the hospital for pain control.  Initially, patient was improving very rapidly and nearing readiness for discharge, however on 08/21/16 pt complained of increased abdominal pain and distention and a repeat CT abdomen was significant for hemoperitoneum of unknown source. Patient was also anemic requiring transfusion of 2 units pRBC's.  Patient was closely monitored with serial abdominal exams and his pain  improved and he remained hemodynamically stable following blood transfusion. On 3/14 pt experienced increasing chest dicomfort and a right thoracentesis was performed yielding 900 cc fluid and improving patient symptoms. During this procedure pt found to have incidental bipedicle fracture of  T-10 and neurosurgery was consulted. They recommended non-surgical managment with TLSO brace.  On 08/28/16 the patient's vital signs were stable, pain controlled, abdominal exam improved, tolerating diet, mobilizing with therapies, urinating, and was medically stable for [facility]. He will follow-up in our trauma/CCS office as needed and knows to call with questions and concerns. He will require neurosurgical follow up as well.   Allergies as of 08/28/2016      Reactions   Ativan [lorazepam]    hallucinations         Durable Medical Equipment        Start     Ordered   08/19/16 1631  For home use only DME Walker rolling  Once    Question:  Patient needs a walker to treat with the following condition  Answer:  Multiple rib fractures   08/19/16 1631   08/19/16 1630  For home use only DME Bedside commode  Once    Question:  Patient needs a bedside commode to treat with the following condition  Answer:  Multiple rib fractures   08/19/16 1631     OBGyn Exam   Follow-up Information    [facility]. Schedule an appointment as soon as possible for a visit in 2 week(s).   Why:  call to schedule an appointment for follow up in 2-3 weeks Contact information: [address] [id number] [phone]         Signed: Obie Dredge, [facility] 08/28/2016, 10:05 AM Pager: [phone] Consults: [phone] Mon-Fri 7:00 am-4:30 pm Sat-Sun 7:00 am-11:30 am

## 2016-08-18 NOTE — Progress Notes (Signed)
  Speech Language Pathology Treatment: Dysphagia  Patient Details Name: Baldwin Racicot MRN: [id number] DOB: [birth year] Today's Date: 08/18/2016 Time: 0518-3358 SLP Time Calculation (min) (ACUTE ONLY): 15 min  Assessment / Plan / Recommendation Clinical Impression  F/u for dysphagia, which according to daughter, has been chronic.  She describes coughing throughout meals at baseline.  After discussion re: options, she would like to pursue MBS to help elucidate source of deficits, and would prefer to have study completed while pt is an inpt rather than bringing him back as an OP.  Today, as long as he tucks chin with liquid consumption, he minimizes cough.  Continue current diet pending study next date - will do in am pending potential D/C.   HPI HPI: 59yom s/p fall from tree. Through his daughter he climbed about 25 feet then lost his stand. While trying to climb down he fell from about 3 meters. Sustained right rib fx, pneumothorax, costochondral fx, small anterior med hematoma. Ct chest on arrival shows There is a right-sided pneumothorax, estimated to be 5% of lung volume, seen primarily along the medial aspect of the lung. Right chest wall subcutaneous gas is present primarily along the anterior aspect of the chest. There is a small right pleural effusion. Minimal subsegmental atelectasis identified at the right lung base. Within the right lower lobe a pulmonary nodule measures 6 mm. Family reproted to MD that pt does not drink much due to fear of choking, frequent choking. UGI on 2007 showed dysmotility, hypertrophied cricopharyngeus muscle.       SLP Plan  Continue with current plan of care       Recommendations  Diet recommendations: Thin liquid;Regular Liquids provided via: Cup;Straw Medication Administration: Whole meds with liquid Supervision: Patient able to self feed Compensations: Slow rate;Small sips/bites;Chin tuck Postural Changes and/or Swallow Maneuvers: Seated upright 90  degrees                Oral Care Recommendations: Oral care BID SLP Visit Diagnosis: Dysphagia, unspecified (R13.10) Plan: Continue with current plan of care       GO                Juan Quam Laurice 08/18/2016, 11:32 AM

## 2016-08-18 NOTE — Progress Notes (Signed)
Physical Therapy Treatment Patient Details Name: [name] MRN: [id number] DOB: 1928-03-29 Today's Date: 08/18/2016    History of Present Illness Pt adm after 25 foot fall from a tree and suffered rt rib fx's and small rt pneumothorax. PMH - HTN, RA, rt rotator cuff problems, Hep A, and Depression.    PT Comments    Pt presented supine in bed with HOB elevated, awake and willing to participate in therapy session. Pt making good progress with mobility, ambulating 50' with RW and min guard. However, pt continues to be very cautious and slow with gait, with greatly decreased stride length. All VSS throughout. Pt would continue to benefit from skilled physical therapy services at this time while admitted and after d/c to address his limitations in order to improve his overall safety and independence with functional mobility.     Follow Up Recommendations  Home health PT;Supervision/Assistance - 24 hour     Equipment Recommendations  Rolling walker with 5" wheels;3in1 (PT)    Recommendations for Other Services       Precautions / Restrictions Precautions Precautions: Fall Restrictions Weight Bearing Restrictions: No    Mobility  Bed Mobility Overal bed mobility: Needs Assistance Bed Mobility: Supine to Sit     Supine to sit: Min assist     General bed mobility comments: increased time, HOB elevated, use of bed rails, min A to elevate trunk to sitting EOB  Transfers Overall transfer level: Needs assistance Equipment used: Rolling walker (2 wheeled) Transfers: Sit to/from Stand Sit to Stand: Min guard         General transfer comment: increased time, min guard assist for safety  Ambulation/Gait Ambulation/Gait assistance: Min guard Ambulation Distance (Feet): 50 Feet Assistive device: Rolling walker (2 wheeled) Gait Pattern/deviations: Step-through pattern;Decreased stride length;Trunk flexed;Shuffle;Decreased step length - right;Decreased step length - left Gait  velocity: decreased. Gait velocity interpretation: Below normal speed for age/gender General Gait Details: pt demonstrated safety with use of RW and min guard. pt taking very short steps bilaterally, unable to increase stride length   Stairs            Wheelchair Mobility    Modified Rankin (Stroke Patients Only)       Balance Overall balance assessment: Needs assistance Sitting-balance support: No upper extremity supported;Feet supported Sitting balance-Leahy Scale: Fair     Standing balance support: No upper extremity supported;During functional activity Standing balance-Leahy Scale: Fair                      Cognition Arousal/Alertness: Awake/alert Behavior During Therapy: WFL for tasks assessed/performed Overall Cognitive Status: Within Functional Limits for tasks assessed                      Exercises      General Comments        Pertinent Vitals/Pain Pain Assessment: Faces Faces Pain Scale: Hurts little more Pain Location: ribs Pain Descriptors / Indicators: Sore Pain Intervention(s): Monitored during session;Repositioned    Home Living                      Prior Function            PT Goals (current goals can now be found in the care plan section) Acute Rehab PT Goals Patient Stated Goal: to return home PT Goal Formulation: With patient Time For Goal Achievement: 08/30/16 Potential to Achieve Goals: Good Progress towards PT goals: Progressing toward goals  Frequency    Min 3X/week      PT Plan Current plan remains appropriate    Co-evaluation             End of Session   Activity Tolerance: Patient tolerated treatment well Patient left: in chair;with call bell/phone within reach Nurse Communication: Mobility status PT Visit Diagnosis: Unsteadiness on feet (R26.81);Pain Pain - part of body:  (ribs)     Time: 2297-9892 PT Time Calculation (min) (ACUTE ONLY): 16 min  Charges:  $Gait Training:  8-22 mins                    G CodesClearnce Sorrel Bryce Cheever 2016/08/29, 9:43 AM Sherie Don, PT, DPT [phone]

## 2016-08-18 NOTE — Progress Notes (Signed)
Occupational Therapy Treatment Patient Details Name: [name] MRN: [id number] DOB: Dec 13, 1927 Today's Date: 08/18/2016    History of present illness Pt adm after 25 foot fall from a tree and suffered rt rib fx's and small rt pneumothorax. PMH - HTN, RA, rt rotator cuff problems, Hep A, and Depression.   OT comments  Pt educated in use of AE for LB ADL and assisted back to bed at his request. Continues to be limited by pain.  Follow Up Recommendations  Home health OT;Supervision/Assistance - 24 hour    Equipment Recommendations  3 in 1 bedside commode    Recommendations for Other Services      Precautions / Restrictions Precautions Precautions: Fall Restrictions Weight Bearing Restrictions: No       Mobility Bed Mobility Overal bed mobility: Needs Assistance Bed Mobility: Sit to Supine      Sit to supine: Min assist   General bed mobility comments: min assist for LEs and to pull up in bed with gravity assist  Transfers Overall transfer level: Needs assistance Equipment used: Rolling walker (2 wheeled) Transfers: Sit to/from Stand Sit to Stand: Min guard Stand pivot transfers: Min guard       General transfer comment: increased time, min guard assist for safety    Balance Overall balance assessment: Needs assistance Sitting-balance support: No upper extremity supported;Feet supported Sitting balance-Leahy Scale: Fair     Standing balance support: No upper extremity supported;During functional activity Standing balance-Leahy Scale: Fair                     ADL Overall ADL's : Needs assistance/impaired               Lower Body Bathing Details (indicate cue type and reason): educated in use of long handled bath sponge     Lower Body Dressing: Sit to/from stand;Minimal assistance Lower Body Dressing Details (indicate cue type and reason): practiced use of sock aide and demonstrated use of reacher for pants, removing sock   Toilet Transfer  Details (indicate cue type and reason): pt declines attempt to go to the bathroom           General ADL Comments: No family available to educate.      Vision                     Perception     Praxis      Cognition   Behavior During Therapy: WFL for tasks assessed/performed Overall Cognitive Status: Within Functional Limits for tasks assessed                         Exercises     Shoulder Instructions       General Comments      Pertinent Vitals/ Pain       Pain Assessment: Faces Faces Pain Scale: Hurts little more Pain Location: ribs Pain Descriptors / Indicators: Sore;Grimacing;Guarding Pain Intervention(s): Monitored during session;Repositioned  Home Living                                          Prior Functioning/Environment              Frequency  Min 2X/week        Progress Toward Goals  OT Goals(current goals can now be found in the care plan section)  Progress  towards OT goals: Progressing toward goals  Acute Rehab OT Goals Patient Stated Goal: to return home Time For Goal Achievement: 08/23/16 Potential to Achieve Goals: Good  Plan Discharge plan remains appropriate    Co-evaluation                 End of Session Equipment Utilized During Treatment: Rolling walker  OT Visit Diagnosis: Muscle weakness (generalized) (M62.81);Unsteadiness on feet (R26.81);Pain;Other abnormalities of gait and mobility (R26.89) Pain - Right/Left: Right Pain - part of body:  (ribs)   Activity Tolerance Patient limited by pain   Patient Left in bed;with call bell/phone within reach   Nurse Communication          Time: 2947-6546 OT Time Calculation (min): 18 min  Charges: OT General Charges $OT Visit: 1 Procedure OT Treatments $Self Care/Home Management : 8-22 mins   Malka So 08/18/2016, 10:54 AM  [phone]

## 2016-08-19 ENCOUNTER — Inpatient Hospital Stay (HOSPITAL_COMMUNITY): Payer: Medicare Other

## 2016-08-19 MED ORDER — POLYETHYLENE GLYCOL 3350 17 G PO PACK
17.0000 g | PACK | Freq: Every day | ORAL | Status: DC
  Administered 2016-08-19 – 2016-08-28 (×10): 17 g via ORAL
  Filled 2016-08-19 (×10): qty 1

## 2016-08-19 MED ORDER — TRAMADOL HCL 50 MG PO TABS: 50.0000 mg | ORAL_TABLET | Freq: Two times a day (BID) | ORAL | 0 refills | Status: DC | PRN

## 2016-08-19 MED ORDER — OXYCODONE HCL 5 MG PO TABS: 5.0000 mg | ORAL_TABLET | Freq: Four times a day (QID) | ORAL | 0 refills | Status: DC | PRN

## 2016-08-19 MED ORDER — IPRATROPIUM-ALBUTEROL 0.5-2.5 (3) MG/3ML IN SOLN
3.0000 mL | Freq: Four times a day (QID) | RESPIRATORY_TRACT | Status: DC | PRN
  Administered 2016-08-19 – 2016-08-22 (×3): 3 mL via RESPIRATORY_TRACT
  Filled 2016-08-19 (×5): qty 3

## 2016-08-19 MED ORDER — HYDROCODONE-ACETAMINOPHEN 5-325 MG PO TABS
1.0000 | ORAL_TABLET | ORAL | Status: DC | PRN
  Administered 2016-08-19: 2 via ORAL
  Filled 2016-08-19: qty 2
  Filled 2016-08-19: qty 1

## 2016-08-19 MED ORDER — ACETAMINOPHEN 325 MG PO TABS: 650.0000 mg | ORAL_TABLET | Freq: Four times a day (QID) | ORAL | Status: DC

## 2016-08-19 NOTE — Progress Notes (Signed)
Patient reported having some difficulty breathing at 0310. Increased wheezing noted. No other changes in lung sounds. Patient was 94% on room air; respirations 22. Patient used incentive spirometer; reached 1063ml.Patient reported some chest pain. Pain medicine given. Reassessed patient at 60. Patient reported no longer having any difficulty breathing and that he just wanted to sleep. Notified MD of patient's condition via text page. Dr. Grandville Silos returned call; order for duo-neb treatments every 6 hours as needed. Will continue to monitor patient.

## 2016-08-19 NOTE — Progress Notes (Signed)
[facility] Progress Note     Subjective: C/o chest pain not improved from yesterday. Not eating cause his abdomen is bloated. Having lots of flatus. No nausea or vomiting. Last BM 2 days ago.   Objective: Vital signs in last 24 hours: Temp:  [97.5 F (36.4 C)-98.7 F (37.1 C)] 97.5 F (36.4 C) (03/09 0514) Pulse Rate:  [63-74] 63 (03/09 0514) Resp:  [16-22] 18 (03/09 0514) BP: (116-153)/(30-69) 153/52 (03/09 0514) SpO2:  [94 %-97 %] 95 % (03/09 0514) Last BM Date: 08/17/16 (Simultaneous filing. User may not have seen previous data.)  Intake/Output from previous day: 03/08 0701 - 03/09 0700 In: 1100 [P.O.:600; I.V.:500] Out: 1400 [Urine:1400] Intake/Output this shift: No intake/output data recorded.  PE: Gen:  Alert, NAD, pleasant, cooperative, appears uncomfortable Card:  RRR, normal S1 and S2 Neck: large area of ecchymosis noted to right side of neck, supple, trachea midline, no TTP Pulm:  Crepitus bilaterally to upper chest, CTA, no W/R/R, no increased work of breathing Abd: distended, +BS, no TTP  Skin: no rashes noted, warm and dry  Lab Results:  No results for input(s): WBC, HGB, HCT, PLT in the last 72 hours. BMET  Recent Labs  08/17/16 0218 08/18/16 0342  NA 137 140  K 4.4 4.6  CL 105 107  CO2 26 27  GLUCOSE 110* 98  BUN 21* 20  CREATININE 1.19 1.03  CALCIUM 8.2* 8.4*   PT/INR No results for input(s): LABPROT, INR in the last 72 hours. CMP     Component Value Date/Time   NA 140 08/18/2016 0342   K 4.6 08/18/2016 0342   CL 107 08/18/2016 0342   CO2 27 08/18/2016 0342   GLUCOSE 98 08/18/2016 0342   BUN 20 08/18/2016 0342   CREATININE 1.03 08/18/2016 0342   CALCIUM 8.4 (L) 08/18/2016 0342   PROT 6.7 08/13/2016 1530   ALBUMIN 3.9 08/13/2016 1530   AST 88 (H) 08/13/2016 1530   ALT 42 08/13/2016 1530   ALKPHOS 47 08/13/2016 1530   BILITOT 1.2 08/13/2016 1530   GFRNONAA >60 08/18/2016 0342   GFRAA >60 08/18/2016 0342   Lipase  No  results found for: LIPASE     Studies/Results: No results found.  Anti-infectives: Anti-infectives    None       Assessment/Plan 20-25 foot fall from tree Right PTX with subcutaneous emphysema- chest x-ray yesterday show stable pneumomediastinum. Continue pulmonary toilet, IS use Right sided costochondral fractures (4-6) sternum is intact Small anterior mediastinal hematoma- stable Right ingunial hernia containing loops of small bowel and colonic diverticulosis- Incidental finding on CT  Constipation-  Resolved, large BM 08/17/16, positive bowel sounds to all quadrants  Abdominal Distention - pt having lots of flatus  VTE- Lovenox, SCD FEN-  IVF,encourage oral intake, speech therapy recommends thin liquids  Pain- Morphine 1mg  every 2hrs PRN, Oxycodone 5mg  every 4hrs PRN, Tramadol 50mg  every 6hrs,Ibuprofen 800mg  every 8hrs PRN,Cyclobenaprine 5mg  3 times daily Acetaminophen 650mg  every 6hrs  Disposition/Plan-  PT/OT, mobilize, pain control, possible discharge today    LOS: 6 days    Kalman Drape , [facility] 08/19/2016, 8:42 AM Pager: [phone] Consults: [phone] Mon-Fri 7:00 am-4:30 pm Sat-Sun 7:00 am-11:30 am

## 2016-08-19 NOTE — Progress Notes (Signed)
Case Management Note  Patient Details  Name: [name] MRN: [id number] Date of Birth: 11/21/1927  Subjective/Objective:   Pt admitted on 08/13/16 after 25 foot fall from a tree; he sustained RT rib fx and small RT PTX.  PTA, pt independent, lives alone; his daughter lives in the house next to him.                 Action/Plan: PT/OT recommending HH follow up, RW, and 3 in 1 BSC.  Pt's daughter from out of town will stay with pt until 3/17 to provide 24hr assistance.    Expected Discharge Date:                         Expected Discharge Plan:  Home w Home Health Services  In-House Referral:     Discharge planning Services  CM Consult  Post Acute Care Choice:  Home Health  Choice offered to:   POA/Adult Children  DME Arranged:   RW, 3 in 1 BSC DME Agency:   [facility]  HH Arranged: PT/OT   HH Agency:   [facility]  Status of Service:  In process, will continue to follow  If discussed at Long Length of Stay Meetings, dates discussed:  08/18/16  Additional Comments: 08/19/16 1630 Met with pt and daughter to discuss discharge planning.  Daughter states she does not think pt is ready to dc, as he is not eating or drinking; feels bloated.  MD in to see patient; dc put on hold and KUB ordered to R/O ileus.  Pt and daughter agreeable with arrangement of HH services in preparation for discharge; daughter chooses [facility] for HH needs.  Referral to Karen with [facility] for HH follow up; start of care 24-48h post dc date.  RW and BSC ordered; will notify [facility] DME reps when discharge date is known.  Will follow progress.  J. , RN, BSN   W. , RN, BSN  Trauma/Neuro ICU Case Manager [phone] 

## 2016-08-19 NOTE — Discharge Instructions (Signed)
Continue to use the incentive spirometry at home Take Miralax and a stool softener while taking narcotic pain medication. You cannot drive while taking narcotic pain medication Call our office if you develop a cough, fever, chills, worsening chest pain, difficulty breathing or any other concerning symptoms  ???????????? (Pneumothorax) ????????????, ??????? ?????? ???????? ??????? (????????) ???????, ???????????? ????? ?????????, ??? ??????? ?????????? ??????????? ??????? ?? ??????? ? ???????????? ????? ?????? ? ??????? ?????? (?????????? ??????? ? ??????????? ???????). ??? ????????????? ??????, ???????? ?? ??????? ???????, ???????? ????? ?????, ??????????? ??????? ???????????? ??????? [??? ?????]. ??? ?????????, ??? ???????, ????????? ????????. ?????????? ??????? ????? ???? ??????? ??? ?????????. ????????? ???????????? ????? ?????? ??????????????. ????? ???????????? ??????, ????? ????????? ????? ??????? ??????? ? ??????????????. ??????? ???????????? ?????? ????? ?????? ????????? ??? ??????? ???????. ???? ? ???????????? ????????????? ??????, ???????? ?????????? ???? ??? ?????????, ???????????? ????? ???????? ????? ???????? ?????????????. ??? ?? ?????, ???????????? ????? ?????? ????????? ???? ? ????? ??? ????? ????????? ??????? ? ???????. ??????, ?????????????, ??????????? ????????? ??? ??????????? ??????? ?????? ????? ????? ??????? ????????????. ???????? ? ???????? ?????? ???????????? ?? ?????????????? ???????? ??????????. ????? ???????? ???????, ????? ??? ????? ????:  ???? ? ?????.  ??????.  ?????????? ??????? ???????.  ????????? ??? ??? ???? (??????). ??????? ???????????? ?????? ????????????? ? ??????? ?????????????? ??? ??-???????????? ??????? ??????? ??????. ??? ??????? ???? ????? ?????? ???? ??????? ??????? ? ???????? ??????????? ??????, ????? ??????????, ?????? ? ??? ?????? ????????????. ??????? ????????? ???????????? ????? ?????? ??????????????, ??? ???????. ?????????????? ???????? ??????  ????????? ???????? ?????????? ?????????? ????????????. ? ?????? ???????? ????????????? ??? ????????????? ? ???????????? ????????????, ?????? ????????? ????????? ??????????? ???????. ? ????????? ??????? ???????? ??????????????? ????? ???????? ??????, ????????? ????. ? ?????? ??????? ? ??????????? ??????? [????? ?????? ? ??????? ?????] ????? ???? ????????? ??????????? ??????. ??????? ?????? - ??? ????????? ??????, ??????? ???????? ????? ??????? ??? ???????? ?? ? ??????????? ???????. ??? ????????? ????????? ?????? [?? ??????????? ???????] ? ????? ??????? ???? ??????? ??????????? ?? ?????????? ????????. ??????? ???????????? ?????? ??????? ?????????? ? ????????. ???? ?????? ??????? ? ??????????? ??????? ???????????, ????????, ??????????? ??????? ??????? ?????? ?? ????????? ????, ???? ?????? ?? ???????????. ? ????????? ??????? ????? ????????????? ????????????? ????????. ?????????? ?? ????? ? ???????? ????????  ?????????? ?????? ??????????? ?????? ?????????????? ??? ??????????? ?????????.  ???? ?????? ??? ???? ?????? ??? ????? ?? ?????, ?????????? ????? ? ???????????????? ????????? ? ?????? ?????????? ??? ????? 2-3 ???????.  ????????? ? ?????????? ?????????? ??????????, ??? ??????? ????? ??????.  ???? ? ??? ??????? ??????? ??????, ???????? ? ?????? ?????, ????? ????? ????? ????? ???????. ?? ????, ??? ??? ????? ?? ?????? ??????? ???????, ?? ??????? ?? ??????.  ?? ?????????????. ??????? ???????? ???????? ????? ??? ?????????????.  ?? ??????? ?????? ? ???????? ??? ???????????? ?????????? ??? ????, ???? ??? ?? ???????? ??? ????.  ???????? ????????? ?????? ???????? ?????. ?????????? ?????????? ? ?????, ????:  ? ??? ??????????? ???? ? ????? ??? ??????.  ? ??? ??????, ??????? ?? ??????????? ??????????? ?? ?????.  ??? ????? ?? ????? ??????????? ?????.  ???? ?????????, ? ?? ?????????? ?????????? ??????? ????????.  ?? ???????????? ?????? ????? (???????), ?????????? ? ?????? ??? ??????? ????.  ? ???  ?????????? ???????????, ???????? ???? ??? ????????? ? ??? ?????, ??? ????????? ??????? ?????? (???? ???????????? ?????? ? ?? ??????????????).  ????????? ??? ? ??? ?????, ??? ????????? ??????? ??????.  ?? ?????????? ????? ??????? ?? ???? ?????, ??? ????????? ??????? ??????.  ? ??? ?????????? ????????? ??????????? ????, ??? ???????? ???????????? ?????? 2-3 ????.  ? ??? ???????? ????????? ?/??? ???????? ???????? ??????????. ?????????, ??? ??:  ????????? ?????? ??????????.  ?????? ?????????????? ??????? ?? ????? ??????????.  ??????????????? ?????????? ? ?????, ???? ??? ?? ?????????? ????? ??? ?????????? ????. ??? ?????????? ?? ????? ???????? ??????, ??????????????? ????? ??????. ??????????? ???????? ????? ???????????? ??? ??????? ? ????? ??????? ??????.  Document Released: 05/30/2005 Document Revised: 03/20/2013 Document Reviewed: 10/23/2013 Elsevier Interactive Patient Education  2017 [employer].

## 2016-08-19 NOTE — Consult Note (Signed)
[facility] CM Primary Care Navigator  08/19/2016  [name] 02-26-1928 [id number]  Met with patient at the bedside to identify possible discharge needs. Patient reports having "fallen from a tree and broke a rib" that had led to this admission.  Patient endorses Dr. Lajean Boyle as the primary care provider.    Patient shared using [facility] in [place] to obtain medications without difficulty.   Patient reports managing his own medications at home.   His daughter [name]) provides transportation to his doctors' appointments.  Patient is independent, lives alone and his daughter lives in the house next to him.  He reports that another daughter from out of town will stay with him for a week to provide assistance at home.   Anticipated discharge plan is home with home health services.  Patient voiced understanding to call primary care provider's office when he returns home, for a post discharge follow-up appointment within a week or sooner if needs arise. Patient letter (with PCP's contact number) was provided as a reminder.  Patient denies any health management needs or concerns at this time.  For additional questions please contact:  [name], BSN, RN-BC [facility] PRIMARY CARE Navigator Cell: [phone]

## 2016-08-19 NOTE — Progress Notes (Signed)
Modified Barium Swallow Progress Note  Patient Details  Name: [name] MRN: [id number] Date of Birth: 1927-12-17  Today's Date: 08/19/2016  Modified Barium Swallow completed.  Full report located under Chart Review in the Imaging Section.  Brief recommendations include the following:  Clinical Impression  [name] presents with a suspected esophageal dysphagia (no penetration/aspiration during this study). Oral phase impairments included premature spillage with thin liquids via straw. Initial swallow of thin consecutive cup sips resulted in sudden coughing, although no penetrates/aspirates on fluoro. Vallecular and pyriform sinus residue throughout the exam, however pt was able to clear residuals with reflexive second swallows. Mastication of solids was timely with no oral impairments and mild vallecular residue. The barium pill became briefly lodged at the level of the CP segment but then cleared into the esophagus, although the pill remained stagnant in the esophagus and noted backflow during esophageal scan (MBS does not diagnose below the level of the UES). Educated pt and daughter re: results of assessment and esophageal precautions (remain seated upright after meals, follow solids with liquids, possible GI consult). No further ST needed.    Swallow Evaluation Recommendations   Recommended Consults: Consider GI evaluation   SLP Diet Recommendations: Regular solids;Thin liquid   Liquid Administration via: Cup;Straw   Medication Administration: Whole meds with liquid   Supervision: Patient able to self feed   Compensations: Slow rate;Small sips/bites   Postural Changes: Remain semi-upright after after feeds/meals (Comment);Seated upright at 90 degrees (upright 30 mins-1 hour after meals)   Oral Care Recommendations: Oral care BID        [name] 08/19/2016,1:32 PM  [name].Ed [employer] [phone]

## 2016-08-19 NOTE — Progress Notes (Signed)
Family states that he gets nauseated with oxycodone.  Will try Norco.  Kathryne Eriksson. Dahlia Bailiff, MD, [place] [phone] Trauma Surgeon

## 2016-08-19 NOTE — Progress Notes (Signed)
Pt. Ambulated around hall. O2 sats maintained at 94%-99%. No complaints of SOB. Tolerated ambulation well with walker.

## 2016-08-20 ENCOUNTER — Encounter (HOSPITAL_COMMUNITY): Payer: Self-pay

## 2016-08-20 ENCOUNTER — Inpatient Hospital Stay (HOSPITAL_COMMUNITY): Payer: Medicare Other

## 2016-08-20 ENCOUNTER — Other Ambulatory Visit: Payer: Self-pay

## 2016-08-20 DIAGNOSIS — R079 Chest pain, unspecified: Secondary | ICD-10-CM

## 2016-08-20 DIAGNOSIS — R0789 Other chest pain: Secondary | ICD-10-CM

## 2016-08-20 DIAGNOSIS — K567 Ileus, unspecified: Secondary | ICD-10-CM

## 2016-08-20 DIAGNOSIS — R1310 Dysphagia, unspecified: Secondary | ICD-10-CM

## 2016-08-20 LAB — CBC
HEMATOCRIT: 30.3 % — AB (ref 39.0–52.0)
HEMOGLOBIN: 9.7 g/dL — AB (ref 13.0–17.0)
MCH: 29.1 pg (ref 26.0–34.0)
MCHC: 32 g/dL (ref 30.0–36.0)
MCV: 91 fL (ref 78.0–100.0)
PLATELETS: 242 10*3/uL (ref 150–400)
RBC: 3.33 MIL/uL — AB (ref 4.22–5.81)
RDW: 13.3 % (ref 11.5–15.5)
WBC: 8.9 10*3/uL (ref 4.0–10.5)

## 2016-08-20 LAB — COMPREHENSIVE METABOLIC PANEL
ALT: 39 U/L (ref 17–63)
AST: 42 U/L — ABNORMAL HIGH (ref 15–41)
Albumin: 3 g/dL — ABNORMAL LOW (ref 3.5–5.0)
Alkaline Phosphatase: 54 U/L (ref 38–126)
Anion gap: 10 (ref 5–15)
BUN: 13 mg/dL (ref 6–20)
CALCIUM: 8.3 mg/dL — AB (ref 8.9–10.3)
CO2: 23 mmol/L (ref 22–32)
CREATININE: 1.06 mg/dL (ref 0.61–1.24)
Chloride: 104 mmol/L (ref 101–111)
GFR calc Af Amer: >60 mL/min (ref >60)
GFR calc non Af Amer: >60 mL/min (ref >60)
Glucose, Bld: 142 mg/dL — ABNORMAL HIGH (ref 65–99)
Potassium: 4.2 mmol/L (ref 3.5–5.1)
Sodium: 137 mmol/L (ref 135–145)
Total Bilirubin: 2.1 mg/dL — ABNORMAL HIGH (ref 0.3–1.2)
Total Protein: 5.9 g/dL — ABNORMAL LOW (ref 6.5–8.1)

## 2016-08-20 LAB — CREATININE, SERUM: Creatinine, Ser: 1 mg/dL (ref 0.61–1.24)

## 2016-08-20 LAB — LIPASE, BLOOD: Lipase: 14 U/L (ref 11–51)

## 2016-08-20 LAB — TROPONIN I
Troponin I: 0.05 ng/mL (ref <0.03)
Troponin I: 0.13 ng/mL (ref <0.03)
Troponin I: 0.14 ng/mL (ref <0.03)

## 2016-08-20 LAB — BRAIN NATRIURETIC PEPTIDE: B Natriuretic Peptide: 98.4 pg/mL (ref 0.0–100.0)

## 2016-08-20 MED ORDER — LEVALBUTEROL HCL 0.63 MG/3ML IN NEBU
0.6300 mg | INHALATION_SOLUTION | RESPIRATORY_TRACT | Status: AC
  Administered 2016-08-20: 0.63 mg via RESPIRATORY_TRACT
  Filled 2016-08-20: qty 3

## 2016-08-20 MED ORDER — ENSURE ENLIVE PO LIQD
237.0000 mL | Freq: Two times a day (BID) | ORAL | Status: DC
  Administered 2016-08-20 – 2016-08-25 (×9): 237 mL via ORAL

## 2016-08-20 MED ORDER — FUROSEMIDE 10 MG/ML IJ SOLN
20.0000 mg | Freq: Once | INTRAMUSCULAR | Status: AC
  Administered 2016-08-20: 20 mg via INTRAVENOUS
  Filled 2016-08-20: qty 2

## 2016-08-20 MED ORDER — DEXTROSE-NACL 5-0.9 % IV SOLN
INTRAVENOUS | Status: DC
  Administered 2016-08-21 – 2016-08-23 (×5): via INTRAVENOUS

## 2016-08-20 MED ORDER — PROMETHAZINE HCL 25 MG/ML IJ SOLN
6.2500 mg | INTRAMUSCULAR | Status: DC | PRN
  Administered 2016-08-20: 6.25 mg via INTRAVENOUS
  Filled 2016-08-20: qty 1

## 2016-08-20 MED ORDER — MILK AND MOLASSES ENEMA
1.0000 | Freq: Once | RECTAL | Status: AC
  Administered 2016-08-20: 250 mL via RECTAL
  Filled 2016-08-20: qty 250

## 2016-08-20 NOTE — Progress Notes (Signed)
@   2102 on call Dr Tamala Julian made aware regarding pt troponin of 0.14 with new order to recheck Troponin level in AM. Pt`s family requesting pt to be seen by a doctor. @ 2114 Paged On call Dr. Brantley Stage updated regarding pts status and family`s wishes for pt to be seen by a doctor. @ 2145 Dr Brantley Stage came by to see pt.

## 2016-08-20 NOTE — Progress Notes (Signed)
Lab called with critical troponin of 0.05. MD notified.

## 2016-08-20 NOTE — Consult Note (Addendum)
[facility] Consult [name] IOE:[id number] DOB: Jan 05, 1928 DOA: 08/13/2016  Referring physician: Dr Fanny Skates PCP: [name], MD   Chief Complaint: diaphoresis, chest pain, abd pain  HPI: I was kindly asked to consult on patient [name], an a 83 y.o. male in the trauma service by Dr. Dalbert Batman for medical evaluation, with pmhx of nonoperable AS, HTN, RA, hx of nephrolithiasis, gerd, BPH, hx of thryoidectomy, remote tob history.  He was seen in the room this am, his 2 dgts are present, as well as niece.  Of note, this is a gentleman who is status post a fall from a tree. He apparently was climbing a ladder trying to cut down some tree limbs, subsequently fell down about 3 meters, onto his back 08/13/16.  Of note, they found him to have right rib fractures, a small pneumothorax  On chest CT, which was medically managed and costochondral fracture, as well as a small anterior medial hematomas.  Since hospitalization, he has had difficulty with bowel movements, although he is passing flatus.  Has been on a bowel regimen since admission and per his daughter, Bobbe Medico was moving about with walker yesterday.  Of note, KUB yesterday noted a probable mild ileus as well.  Of note, today, while attempting to move about in bed, developed worsening diffuse chest pain, diaphoresis, no tachycardia or tachypnea, and diffuse abd distension/pain.  C/o of dark urination as well.  Per pt, since resting in bed, the chest pains now described as constant ache from his rib fractures.  Has not used IS this am due to increasing coughing spells and mild nausea. No emesis.   Pt also w/ complaints of dysphagia yesterday, and on MBS 08/19/16, noted eso dysphagia, gi c/s pending today per DR Dalbert Batman.  2 dgts and Niece are present at bedside. Most of hx obtained via assistance of family interpretation.      Review of Systems:  Per hpi, o/w all systems reviewed and negative.   Past Medical History:    Diagnosis Date  . At risk for sleep apnea    STOP-BANG= 4    SENT TO PCP 04-04-2014  . BPH (benign prostatic hypertrophy)   . Depression   . Diverticulosis of colon   . GERD (gastroesophageal reflux disease)   . Gilbert's syndrome   . Headache    migraines  . Hearing loss   . Heart murmur    "Calcified aortic valve"  . Hepatitis    Hep A as a child  . History of gastroenteritis   . History of kidney stones   . History of syncope    2002--  vasovagal  . HOH (hard of hearing)    refuses to wear his Hearing Aid  . Hypertension   . Inguinal hernia    right  . Internal hemorrhoid, bleeding   . Moderate aortic stenosis    a. Last echo 11/2013, 06/2014.  Marland Kitchen Nocturia   . Normal coronary arteries    a. Cath 07/2010: normal coronaries. Felt to have noncardiac CP/SOB at that time possibly r/t anxiety.  . RA (rheumatoid arthritis) ([place])    bilateral hands/ wrist--  seronegative  . Rotator cuff syndrome of right shoulder 08/01/2013  . Shortness of breath dyspnea    with exertion  . Stone, kidney   . Thyroid cyst   . Thyroid nodule    noted 11-2009  . Wears dentures    Past Surgical History:  Procedure Laterality Date  . CARDIAC  CATHETERIZATION  07-19-2010  dr Tressia Miners turner   normal coronary arteries,  ef 60%,  moderate aortic stenosis- gradient 33mmHg, normal right heart pressure  . CARDIOVASCULAR STRESS TEST  05/ 2011   dr Marlou Porch   normal low risk perfusion study  . CATARACT EXTRACTION W/ INTRAOCULAR LENS  IMPLANT, BILATERAL  2013  . COLONOSCOPY  2010  approx  . THYROID LOBECTOMY Right 05/05/2015   Procedure: RIGHT THYROID LOBECTOMY;  Surgeon: Armandina Gemma, MD;  Location: [facility];  Service: General;  Laterality: Right;  . TRANSANAL HEMORRHOIDAL DEARTERIALIZATION N/A 04/09/2014   Procedure: TRANSANAL HEMORRHOIDAL DEARTERIALIZATION OF INTERNAL HEMORROIDS;  Surgeon: Leighton Ruff, MD;  Location: [facility];  Service: General;  Laterality: N/A;  . TRANSTHORACIC  ECHOCARDIOGRAM  11-26-2013   moderate focal basal and mild LVH/  ef 16-01%/  grade I diastolic dysfunction/ mild LAE/  moderate calcification with stenosis AV with mild regurg,  gradients 35 abd 58 mmHg /  mild TR   Social History:  reports that he quit smoking about 35 years ago. His smoking use included Cigarettes. He started smoking about 43 years ago. He quit after 40.00 years of use. He has never used smokeless tobacco. He reports that he drinks alcohol. He reports that he does not use drugs.  No Known Allergies  Family History  Problem Relation Age of Onset  . Pulmonary embolism Mother 61    caused by complications of surgery  . CVA Father   . Diabetes Brother   . Diabetes Brother   . Breast cancer Sister      Prior to Admission medications   Medication Sig Start Date End Date Taking? Authorizing Provider  aspirin EC 81 MG tablet Take 81 mg by mouth daily.   Yes Historical Provider, MD  DIOVAN 80 MG tablet Take 80 mg by mouth every morning.  05/19/13  Yes Historical Provider, MD  escitalopram (LEXAPRO) 20 MG tablet Take 20 mg by mouth daily.  07/02/14  Yes Historical Provider, MD  folic acid (FOLVITE) 1 MG tablet Take 1 mg by mouth daily.  01/27/15  Yes Historical Provider, MD  hydrochlorothiazide (MICROZIDE) 12.5 MG capsule Take 12.5 mg by mouth daily. 06/04/13  Yes Historical Provider, MD  HYDROcodone-acetaminophen (NORCO/VICODIN) 5-325 MG tablet Take 1-2 tablets by mouth every 4 (four) hours as needed for moderate pain. 05/06/15  Yes Armandina Gemma, MD  ibuprofen (ADVIL,MOTRIN) 800 MG tablet Take 800 mg by mouth every 8 (eight) hours as needed for moderate pain.  06/02/13  Yes Historical Provider, MD  methotrexate (RHEUMATREX) 2.5 MG tablet Take 22.5 mg by mouth once a week. TAKE ON SUNDAYS 07/13/13  Yes Historical Provider, MD  omeprazole (PRILOSEC) 20 MG capsule TAKE 1 CAPSULE(20 MG) BY MOUTH DAILY 06/15/16  Yes Jerline Pain, MD  simvastatin (ZOCOR) 20 MG tablet TAKE 1 TABLET BY MOUTH  EVERY NIGHT AT BEDTIME 05/06/14  Yes Jerline Pain, MD  acetaminophen (TYLENOL) 325 MG tablet Take 2 tablets (650 mg total) by mouth every 6 (six) hours. 08/19/16   Kalman Drape, PA  dutasteride (AVODART) 0.5 MG capsule Take 0.5 mg by mouth daily. 12/30/14   Historical Provider, MD  oxyCODONE (OXY IR/ROXICODONE) 5 MG immediate release tablet Take 1 tablet (5 mg total) by mouth every 6 (six) hours as needed for moderate pain or severe pain. 08/19/16   Kalman Drape, PA  traMADol (ULTRAM) 50 MG tablet Take 1 tablet (50 mg total) by mouth every 12 (twelve) hours as needed. 08/19/16  Kalman Drape, PA   Physical Exam: Vitals:   08/19/16 0932 08/19/16 1302 08/19/16 2016 08/20/16 0534  BP:  (!) 147/59 140/80 (!) 156/82  Pulse:  73 88 88  Resp:  18 19 19   Temp:  98 F (36.7 C) 97.6 F (36.4 C) 97.8 F (36.6 C)  TempSrc:  Oral Oral Oral  SpO2: 95% 94% 94% 95%  Weight:      Height:        Wt Readings from Last 3 Encounters:  08/13/16 77.1 kg (170 lb)  05/05/15 77.1 kg (170 lb)  04/29/15 77.2 kg (170 lb 3.2 oz)    General:  Appears calm and comfortable, pleasant, NAD, AAOx3, speaks Russ[place], flushed cheeks. Eyes: PERRL, normal lids, irises & conjunctiva ENT: grossly normal hearing, lips & tongue, mmm Neck: no LAD, masses or thyromegaly Cardiovascular: RRR, no m/r/g. No LE edema.  No jvd.  Palpable crepitus right anterior chest wall.  ttp diffusely on chest on exam, reproducible pain Telemetry: SR, no arrhythmias  Respiratory: diminished overall with splinting from pain, exp wheezing, more so on left side. Abdomen: active bs, distended, tympanitic, mild discomfort from distension.   Skin: ecchymosis around neck, and yellow /purple tinged ecchymosis over his entire chest. Musculoskeletal: grossly normal tone BUE/BLE Psychiatric: grossly normal mood and affect, speech fluent and appropriate Neurologic: grossly non-focal.  Diffusely weak, full assist on exam          Labs on Admission:    Basic Metabolic Panel:  Recent Labs Lab 08/13/16 1530 08/13/16 1543 08/14/16 0211 08/16/16 0357 08/17/16 0218 08/18/16 0342 08/20/16 0513  NA 135 138 138 137 137 140  --   K 4.0 4.0 3.6 3.2* 4.4 4.6  --   CL 102 102 105 101 105 107  --   CO2 23  --  26 27 26 27   --   GLUCOSE 120* 120* 115* 118* 110* 98  --   BUN 18 25* 16 16 21* 20  --   CREATININE 1.18 1.20 1.11 0.98 1.19 1.03 1.00  CALCIUM 8.9  --  8.5* 8.5* 8.2* 8.4*  --    Liver Function Tests:  Recent Labs Lab 08/13/16 1530  AST 88*  ALT 42  ALKPHOS 47  BILITOT 1.2  PROT 6.7  ALBUMIN 3.9   No results for input(s): LIPASE, AMYLASE in the last 168 hours. No results for input(s): AMMONIA in the last 168 hours. CBC:  Recent Labs Lab 08/13/16 1530 08/13/16 1543 08/14/16 0211  WBC 13.1*  --  7.4  HGB 11.9* 12.9* 11.0*  HCT 36.1* 38.0* 32.9*  MCV 90.0  --  90.4  PLT 156  --  135*   Cardiac Enzymes: No results for input(s): CKTOTAL, CKMB, CKMBINDEX, TROPONINI in the last 168 hours.  BNP (last 3 results) No results for input(s): BNP in the last 8760 hours.  ProBNP (last 3 results) No results for input(s): PROBNP in the last 8760 hours.  CBG: No results for input(s): GLUCAP in the last 168 hours.  Radiological Exams on Admission: Dg Abd Portable 1v  Result Date: 08/19/2016 CLINICAL DATA:  Adynamic ileus EXAM: PORTABLE ABDOMEN - 1 VIEW COMPARISON:  CT scan August 13, 2016 FINDINGS: Contrast is seen in the cecum, proximal ascending colon, and stomach. Air-filled mildly prominent loops of large and small bowel are seen, possibly representing a mild ileus. No definitive obstruction. IMPRESSION: Probable mild ileus. Electronically Signed   By: Dorise Bullion III M.D   On: 08/19/2016 16:40  Dg Swallowing Func-speech Pathology  Result Date: 08/19/2016 Carterm5 Objective Swallowing Evaluation: Type of Study: MBS-Modified Barium Swallow Study Patient Details Name: Glennon Kopko MRN: [id number] Date of Birth:  1927-06-29 Today's Date: 08/19/2016 Time: SLP Start Time (ACUTE ONLY): 1040-SLP Stop Time (ACUTE ONLY): 1104 SLP Time Calculation (min) (ACUTE ONLY): 24 min Past Medical History: Past Medical History: Diagnosis Date . At risk for sleep apnea   STOP-BANG= 4    SENT TO PCP 04-04-2014 . BPH (benign prostatic hypertrophy)  . Depression  . Diverticulosis of colon  . GERD (gastroesophageal reflux disease)  . Gilbert's syndrome  . Headache   migraines . Hearing loss  . Heart murmur   "Calcified aortic valve" . Hepatitis   Hep A as a child . History of gastroenteritis  . History of kidney stones  . History of syncope   2002--  vasovagal . HOH (hard of hearing)   refuses to wear his Hearing Aid . Hypertension  . Inguinal hernia   right . Internal hemorrhoid, bleeding  . Moderate aortic stenosis   a. Last echo 11/2013, 06/2014. Marland Kitchen Nocturia  . Normal coronary arteries   a. Cath 07/2010: normal coronaries. Felt to have noncardiac CP/SOB at that time possibly r/t anxiety. . RA (rheumatoid arthritis) ([place])   bilateral hands/ wrist--  seronegative . Rotator cuff syndrome of right shoulder 08/01/2013 . Shortness of breath dyspnea   with exertion . Stone, kidney  . Thyroid cyst  . Thyroid nodule   noted 11-2009 . Wears dentures  Past Surgical History: Past Surgical History: Procedure Laterality Date . CARDIAC CATHETERIZATION  07-19-2010  dr Tressia Miners turner  normal coronary arteries,  ef 60%,  moderate aortic stenosis- gradient 24mmHg, normal right heart pressure . CARDIOVASCULAR STRESS TEST  05/ 2011   dr Marlou Porch  normal low risk perfusion study . CATARACT EXTRACTION W/ INTRAOCULAR LENS  IMPLANT, BILATERAL  2013 . COLONOSCOPY  2010  approx . THYROID LOBECTOMY Right 05/05/2015  Procedure: RIGHT THYROID LOBECTOMY;  Surgeon: Armandina Gemma, MD;  Location: [facility];  Service: General;  Laterality: Right; . TRANSANAL HEMORRHOIDAL DEARTERIALIZATION N/A 04/09/2014  Procedure: TRANSANAL HEMORRHOIDAL DEARTERIALIZATION OF INTERNAL HEMORROIDS;  Surgeon: Leighton Ruff, MD;  Location: [facility];  Service: General;  Laterality: N/A; . TRANSTHORACIC ECHOCARDIOGRAM  11-26-2013  moderate focal basal and mild LVH/  ef 95-62%/  grade I diastolic dysfunction/ mild LAE/  moderate calcification with stenosis AV with mild regurg,  gradients 35 abd 58 mmHg /  mild TR HPI: 88yom s/p fall from tree. Through his daughter he climbed about 25 feet then lost his stand. While trying to climb down he fell from about 3 meters. Sustained right rib fx, pneumothorax, costochondral fx, small anterior med hematoma. Ct chest on arrival shows There is a right-sided pneumothorax, estimated to be 5% of lung volume, seen primarily along the medial aspect of the lung. Right chest wall subcutaneous gas is present primarily along the anterior aspect of the chest. There is a small right pleural effusion. Minimal subsegmental atelectasis identified at the right lung base. Within the right lower lobe a pulmonary nodule measures 6 mm. Family reproted to MD that pt does not drink much due to fear of choking, frequent choking. UGI on 2007 showed dysmotility, hypertrophied cricopharyngeus muscle.  No Data Recorded Assessment / Plan / Recommendation CHL IP CLINICAL IMPRESSIONS 08/19/2016 Clinical Impression Mr. Mustin presents with a suspected esophageal dysphagia (no penetration/aspiration during this study). Oral phase impairments included premature spillage with thin liquids via straw.  Initial swallow of thin consecutive cup sips resulted in sudden coughing, although no penetrates/aspirates on fluoro. Vallecular and pyriform sinus residue throughout the exam, however pt was able to clear residuals with reflexive second swallows. Mastication of solids was timely with no oral impairments and mild vallecular residue. The barium pill became briefly lodged at the level of the CP segment but then cleared into the esophagus, although the pill remained stagnant in the esophagus and noted backflow  during esophageal scan (MBS does not diagnose below the level of the UES). Educated pt and daughter re: results of assessment and esophageal precautions (remain seated upright after meals, follow solids with liquids, possible GI consult). No further ST needed.  SLP Visit Diagnosis Dysphagia, pharyngoesophageal phase (R13.14) Attention and concentration deficit following -- Frontal lobe and executive function deficit following -- Impact on safety and function Mild aspiration risk   CHL IP TREATMENT RECOMMENDATION 08/19/2016 Treatment Recommendations No treatment recommended at this time   Prognosis 08/15/2016 Prognosis for Safe Diet Advancement Good Barriers to Reach Goals -- Barriers/Prognosis Comment -- CHL IP DIET RECOMMENDATION 08/19/2016 SLP Diet Recommendations Regular solids;Thin liquid Liquid Administration via Cup;Straw Medication Administration Whole meds with liquid Compensations Slow rate;Small sips/bites Postural Changes Remain semi-upright after after feeds/meals (Comment);Seated upright at 90 degrees   CHL IP OTHER RECOMMENDATIONS 08/19/2016 Recommended Consults Consider GI evaluation Oral Care Recommendations Oral care BID Other Recommendations --   CHL IP FOLLOW UP RECOMMENDATIONS 08/19/2016 Follow up Recommendations None   CHL IP FREQUENCY AND DURATION 08/15/2016 Speech Therapy Frequency (ACUTE ONLY) min 2x/week Treatment Duration 2 weeks      CHL IP ORAL PHASE 08/19/2016 Oral Phase Impaired Oral - Pudding Teaspoon -- Oral - Pudding Cup -- Oral - Honey Teaspoon -- Oral - Honey Cup -- Oral - Nectar Teaspoon -- Oral - Nectar Cup -- Oral - Nectar Straw -- Oral - Thin Teaspoon -- Oral - Thin Cup WFL Oral - Thin Straw Premature spillage Oral - Puree -- Oral - Mech Soft -- Oral - Regular WFL Oral - Multi-Consistency -- Oral - Pill WFL Oral Phase - Comment --  CHL IP PHARYNGEAL PHASE 08/19/2016 Pharyngeal Phase Impaired Pharyngeal- Pudding Teaspoon -- Pharyngeal -- Pharyngeal- Pudding Cup -- Pharyngeal -- Pharyngeal-  Honey Teaspoon -- Pharyngeal -- Pharyngeal- Honey Cup -- Pharyngeal -- Pharyngeal- Nectar Teaspoon -- Pharyngeal -- Pharyngeal- Nectar Cup -- Pharyngeal -- Pharyngeal- Nectar Straw -- Pharyngeal -- Pharyngeal- Thin Teaspoon -- Pharyngeal -- Pharyngeal- Thin Cup Pharyngeal residue - pyriform;Pharyngeal residue - valleculae;Reduced tongue base retraction;Reduced laryngeal elevation Pharyngeal -- Pharyngeal- Thin Straw Pharyngeal residue - pyriform;Reduced laryngeal elevation Pharyngeal -- Pharyngeal- Puree -- Pharyngeal -- Pharyngeal- Mechanical Soft -- Pharyngeal -- Pharyngeal- Regular Pharyngeal residue - valleculae;Reduced tongue base retraction Pharyngeal -- Pharyngeal- Multi-consistency -- Pharyngeal -- Pharyngeal- Pill WFL Pharyngeal -- Pharyngeal Comment --  CHL IP CERVICAL ESOPHAGEAL PHASE 08/19/2016 Cervical Esophageal Phase Impaired Pudding Teaspoon -- Pudding Cup -- Honey Teaspoon -- Honey Cup -- Nectar Teaspoon -- Nectar Cup -- Nectar Straw -- Thin Teaspoon -- Thin Cup -- Thin Straw -- Puree -- Mechanical Soft -- Regular -- Multi-consistency -- Pill -- Cervical Esophageal Comment -- No flowsheet data found. Houston Siren 08/19/2016, 1:31 PM Orbie Pyo Colvin Caroli.Ed CCC-SLP Pager [phone]              Ct chest/abd pelvis, 08/13/16 IMPRESSION: 1. Right pneumothorax, 5% of lung volume. 2. Right chest wall subcutaneous emphysema. 3. Right costochondral fractures. 4. Small anterior mediastinal hematoma. 5. No evidence for great  vessel injury or acute intra-abdominal injury. 6. Bilateral renal cysts. 7. Right inguinal hernia contains nondilated loops of small bowel. 8. Colonic diverticulosis. 9. Prostatic hypertrophy.  Ct cerv spine 08/13/16 IMPRESSION: 1. No acute intracranial abnormality. No intracranial mass, hemorrhage or edema. No skull fracture. Mild chronic small vessel ischemic change within the white matter. 2. No fracture or acute subluxation within the cervical spine. Degenerative  changes of the cervical spine, as detailed above. 3. Aortic atherosclerosis.   Electronically Signed   By: Franki Cabot M.D.   On: 08/13/2016 16:25  Ct head 08/13/16 IMPRESSION: 1. No acute intracranial abnormality. No intracranial mass, hemorrhage or edema. No skull fracture. Mild chronic small vessel ischemic change within the white matter. 2. No fracture or acute subluxation within the cervical spine. Degenerative changes of the cervical spine, as detailed above. 3. Aortic atherosclerosis.   Electronically Signed   By: Franki Cabot M.D.   On: 08/13/2016 16:25   EKG: Independently reviewed. 3/10/ am ekg/ nsr 92 bmp, lad, no acute stemi.  EKG Interpretation  Date/Time:    Ventricular Rate:    PR Interval:    QRS Duration:   QT Interval:    QTC Calculation:   R Axis:     Text Interpretation:          Assessment/Plan Principal Problem:   Pneumothorax Active Problems:   Essential hypertension   Ileus (HCC)   Dysphagia   Chest pain   1. Chest pain, suspect  2nd to msk pain and recent trauma from fall., with noted fractures in right costochondrial region on admission. - ekg no acute findings today,  - follow labs/cbc, CE and bnp - ordered - if abnml labs, will order serial enzymes. - cxr pending  2. eso dysphagia and ileus - defer to primary team and gi,. - avoid narcotics as much as able. - dw pt /family importance of early ambulation, oob to chair/ambulate as much as able  3. Right pneumothorax, 5% of lung volume, present on admission - was conservative treated. - ordered for 1 xopenex nebs trx for now due to wheezing, has duonebs prn. - encouraged IS 10x hr /aggressive pulm toilet  4. Transient diaphoresis today, w/ diffuse pains - felt multifactorial, ie pain/ileus/deconditioning playing a part. - continue PT/OT while inpatient, home pt was anticipated per primary team  5. Htn, uncontrolled, Felt may be exacerbated by pain/ileus as well. -  continue hctz 12.5 qd, irbesartan 75qd for now. - suspect resolution of ileus would help.  6. Hld,  - continue asa and statin  7. gerd - on ppi  8. Depression On lexapro   On behalf of [facility], thank you for the consult and we look forward to following this patient with you. See below - I c/s cardiology for elevated troponin.  Code Status:   full DVT Prophylaxis: lovenox 40sq qd Family Communication: dw w/ pt, 2 dgts and niece at bedside Disposition Plan: per primary team  Time spent: 65mins  Maren Reamer MD., MBA/MHA [facility] Pager [phone]  Addendum/ 1213pm cxr IMPRESSION: 1. Low lung volumes. Right pleural effusion, small to moderate in size. Suspect additional edema and/or atelectasis at the right lung base. 2. Subcutaneous emphysema overlying the right chest wall, similar to previous exams. 3. No pneumothorax seen, possibly resolved since earlier exams, possibly obscured by the overlying subcutaneous emphysema.   Electronically Signed   By: Franki Cabot M.D.   On: 08/20/2016 11:59  - lasix 20mg  iv x 1  ordered,  - + 2.3 liters last 24hrs, strict I/os.  - redose lasix as needed  Addend 550pm Trop bumped to 0.13. Ordered echo, cards c/s, I talked to cards/Dr Golden Hurter for trop up 0.13, she requested echo, and call Cards c/s in am. Call her tonight if pt starts have anginal chest pain or troponins peak even higher. Added this to communication.

## 2016-08-20 NOTE — Progress Notes (Signed)
Pt family upset by his care.  Reviewed his plain film and he has an ileus.  He had a BM with enema earlier today but still is distended.  VSS.  Non toxic appearing. Abdomen distended and quiet on exam consistent with ileus.  No signs of peritonitis on exam currently and not tachycardic.  Family request that he is transferred to higher level of care.  Recommend NGT at this point.  He is not vomiting but family is not happy that he remains distended and says he is too weak to ambulate and wonders why he cannot eat.  I I explained the pathophysiology of ileus.  His electrolytes are in  a reasonable range and medicine is aware of his troponins.  He is tender over his sternum but this is secondary to fall.   CXR reviewed as well and no acute changes noted   Plan is for NGT and  transfer to SDU for monitoring.   Follow his lab work and repeat his plain film in the morning. Hopefully he can get OOB and ambulate soon.

## 2016-08-20 NOTE — Consult Note (Signed)
[facility] Consult Note  Referring Provider: No ref. provider found Primary Care Physician:  [name], MD Primary Gastroenterologist:  Dr.  Laurel Dimmer Complaint: Nausea abdominal bloating, food aversion and chronic dysphagia HPI: [name] is an 83 y.o. white male  who fell from the latter and was admitted to the trauma service with multiple rib fractures and other injuries. Most of the history is from his daughters. They say that since admission he has no significant bowel movements but has passed gas and has developed increasing generalized abdominal discomfort and nausea with subjective bloating and visible distention. In addition he has issues with apparent chronic dysphagia with some degree of coughing or choking with meals but this is chronic and stable over time with no episodes of forced regurgitation. He saw a gastroenterologist 5 years ago and was put on omeprazole and eventually found to have a thyroid goiter which was removed surgically but his problem has persisted somewhat but never required GI consultation. A modified barium swallow showed pharyngeal and possible esophageal cervical abnormalities with some hang-up of the barium tablet. A plain abdominal film yesterday suggested a mild large and small bowel ileus.  Past Medical History:  Diagnosis Date  . At risk for sleep apnea    STOP-BANG= 4    SENT TO PCP 04-04-2014  . BPH (benign prostatic hypertrophy)   . Depression   . Diverticulosis of colon   . GERD (gastroesophageal reflux disease)   . Gilbert's syndrome   . Headache    migraines  . Hearing loss   . Heart murmur    "Calcified aortic valve"  . Hepatitis    Hep A as a child  . History of gastroenteritis   . History of kidney stones   . History of syncope    2002--  vasovagal  . HOH (hard of hearing)    refuses to wear his Hearing Aid  . Hypertension   . Inguinal hernia    right  . Internal hemorrhoid, bleeding   . Moderate aortic  stenosis    a. Last echo 11/2013, 06/2014.  Marland Kitchen Nocturia   . Normal coronary arteries    a. Cath 07/2010: normal coronaries. Felt to have noncardiac CP/SOB at that time possibly r/t anxiety.  . RA (rheumatoid arthritis) ([place])    bilateral hands/ wrist--  seronegative  . Rotator cuff syndrome of right shoulder 08/01/2013  . Shortness of breath dyspnea    with exertion  . Stone, kidney   . Thyroid cyst   . Thyroid nodule    noted 11-2009  . Wears dentures     Past Surgical History:  Procedure Laterality Date  . CARDIAC CATHETERIZATION  07-19-2010  dr Tressia Miners turner   normal coronary arteries,  ef 60%,  moderate aortic stenosis- gradient 44mHg, normal right heart pressure  . CARDIOVASCULAR STRESS TEST  05/ 2011   dr sMarlou Porch  normal low risk perfusion study  . CATARACT EXTRACTION W/ INTRAOCULAR LENS  IMPLANT, BILATERAL  2013  . COLONOSCOPY  2010  approx  . THYROID LOBECTOMY Right 05/05/2015   Procedure: RIGHT THYROID LOBECTOMY;  Surgeon: TArmandina Gemma MD;  [facility]  Service: General;  Laterality: Right;  . TRANSANAL HEMORRHOIDAL DEARTERIALIZATION N/A 04/09/2014   Procedure: TRANSANAL HEMORRHOIDAL DEARTERIALIZATION OF INTERNAL HEMORROIDS;  Surgeon: ALeighton Ruff MD;  [facility]  Service: General;  Laterality: N/A;  . TRANSTHORACIC ECHOCARDIOGRAM  11-26-2013   moderate focal basal and mild LVH/  ef 684-13%/ grade I diastolic  dysfunction/ mild LAE/  moderate calcification with stenosis AV with mild regurg,  gradients 35 abd 58 mmHg /  mild TR    Medications Prior to Admission  Medication Sig Dispense Refill  . aspirin EC 81 MG tablet Take 81 mg by mouth daily.    Marland Kitchen DIOVAN 80 MG tablet Take 80 mg by mouth every morning.     . escitalopram (LEXAPRO) 20 MG tablet Take 20 mg by mouth daily.   4  . folic acid (FOLVITE) 1 MG tablet Take 1 mg by mouth daily.   0  . hydrochlorothiazide (MICROZIDE) 12.5 MG capsule Take 12.5 mg by mouth daily.    Marland Kitchen  HYDROcodone-acetaminophen (NORCO/VICODIN) 5-325 MG tablet Take 1-2 tablets by mouth every 4 (four) hours as needed for moderate pain. 20 tablet 0  . ibuprofen (ADVIL,MOTRIN) 800 MG tablet Take 800 mg by mouth every 8 (eight) hours as needed for moderate pain.     . methotrexate (RHEUMATREX) 2.5 MG tablet Take 22.5 mg by mouth once a week. TAKE ON SUNDAYS    . omeprazole (PRILOSEC) 20 MG capsule TAKE 1 CAPSULE(20 MG) BY MOUTH DAILY 15 capsule 0  . simvastatin (ZOCOR) 20 MG tablet TAKE 1 TABLET BY MOUTH EVERY NIGHT AT BEDTIME 30 tablet 0  . dutasteride (AVODART) 0.5 MG capsule Take 0.5 mg by mouth daily.  0    Allergies: No Known Allergies  Family History  Problem Relation Age of Onset  . Pulmonary embolism Mother 77    caused by complications of surgery  . CVA Father   . Diabetes Brother   . Diabetes Brother   . Breast cancer Sister     Social History:  reports that he quit smoking about 35 years ago. His smoking use included Cigarettes. He started smoking about 43 years ago. He quit after 40.00 years of use. He has never used smokeless tobacco. He reports that he drinks alcohol. He reports that he does not use drugs.  Review of Systems: negative except As above   Blood pressure (!) 156/82, pulse 88, temperature 97.8 F (36.6 C), temperature source Oral, resp. rate 19, height _0  (1.753 m), weight 77.1 kg (170 lb), SpO2 95 %. Head: Normocephalic, without obvious abnormality, atraumatic Neck: no adenopathy, no carotid bruit, no JVD, supple, symmetrical, trachea midline and thyroid not enlarged, symmetric, no tenderness/mass/nodules Resp: clear to auscultation bilaterally Cardio: regular rate and rhythm, S1, S2 normal, no murmur, click, rub or gallop GI: Abdomen distended tympanitic and mildly tender with high-pitched bowel sounds Extremities: extremities normal, atraumatic, no cyanosis or edema  Results for orders placed or performed during the hospital encounter of 08/13/16 (from  the past 48 hour(s))  Creatinine, serum     Status: None   Collection Time: 08/20/16  5:13 AM  Result Value Ref Range   Creatinine, Ser 1.00 0.61 - 1.24 mg/dL   GFR calc non Af Amer >60 >60 mL/min   GFR calc Af Amer >60 >60 mL/min    Comment: (NOTE) The eGFR has been calculated using the CKD EPI equation. This calculation has not been validated in all clinical situations. eGFR's persistently <60 mL/min signify possible Chronic Kidney Disease.    Dg Abd Portable 1v  Result Date: 08/19/2016 CLINICAL DATA:  Adynamic ileus EXAM: PORTABLE ABDOMEN - 1 VIEW COMPARISON:  CT scan August 13, 2016 FINDINGS: Contrast is seen in the cecum, proximal ascending colon, and stomach. Air-filled mildly prominent loops of large and small bowel are seen, possibly representing a mild  ileus. No definitive obstruction. IMPRESSION: Probable mild ileus. Electronically Signed   By: Dorise Bullion III M.D   On: 08/19/2016 16:40   Dg Swallowing Func-speech Pathology  Result Date: 08/19/2016 Carterm5 Objective Swallowing Evaluation: Type of Study: MBS-Modified Barium Swallow Study Patient Details Name: [name] MRN: [id number] Date of Birth: [birth year] Today's Date: 08/19/2016 Time: SLP Start Time (ACUTE ONLY): 1040-SLP Stop Time (ACUTE ONLY): 1104 SLP Time Calculation (min) (ACUTE ONLY): 24 min Past Medical History: Past Medical History: Diagnosis Date . At risk for sleep apnea   STOP-BANG= 4    SENT TO PCP 04-04-2014 . BPH (benign prostatic hypertrophy)  . Depression  . Diverticulosis of colon  . GERD (gastroesophageal reflux disease)  . Gilbert's syndrome  . Headache   migraines . Hearing loss  . Heart murmur   "Calcified aortic valve" . Hepatitis   Hep A as a child . History of gastroenteritis  . History of kidney stones  . History of syncope   2002--  vasovagal . HOH (hard of hearing)   refuses to wear his Hearing Aid . Hypertension  . Inguinal hernia   right . Internal hemorrhoid, bleeding  . Moderate aortic stenosis   a.  Last echo 11/2013, 06/2014. Marland Kitchen Nocturia  . Normal coronary arteries   a. Cath 07/2010: normal coronaries. Felt to have noncardiac CP/SOB at that time possibly r/t anxiety. . RA (rheumatoid arthritis) ([place])   bilateral hands/ wrist--  seronegative . Rotator cuff syndrome of right shoulder 08/01/2013 . Shortness of breath dyspnea   with exertion . Stone, kidney  . Thyroid cyst  . Thyroid nodule   noted 11-2009 . Wears dentures  Past Surgical History: Past Surgical History: Procedure Laterality Date . CARDIAC CATHETERIZATION  07-19-2010  dr Tressia Miners turner  normal coronary arteries,  ef 60%,  moderate aortic stenosis- gradient 56mHg, normal right heart pressure . CARDIOVASCULAR STRESS TEST  05/ 2011   dr sMarlou Porch normal low risk perfusion study . CATARACT EXTRACTION W/ INTRAOCULAR LENS  IMPLANT, BILATERAL  2013 . COLONOSCOPY  2010  approx . THYROID LOBECTOMY Right 05/05/2015  Procedure: RIGHT THYROID LOBECTOMY;  Surgeon: TArmandina Gemma MD;  [facility]  Service: General;  Laterality: Right; . TRANSANAL HEMORRHOIDAL DEARTERIALIZATION N/A 04/09/2014  Procedure: TRANSANAL HEMORRHOIDAL DEARTERIALIZATION OF INTERNAL HEMORROIDS;  Surgeon: ALeighton Ruff MD;  [facility]  Service: General;  Laterality: N/A; . TRANSTHORACIC ECHOCARDIOGRAM  11-26-2013  moderate focal basal and mild LVH/  ef 646-65%/ grade I diastolic dysfunction/ mild LAE/  moderate calcification with stenosis AV with mild regurg,  gradients 35 abd 58 mmHg /  mild TR HPI: 88yom s/p fall from tree. Through his daughter he climbed about 25 feet then lost his stand. While trying to climb down he fell from about 3 meters. Sustained right rib fx, pneumothorax, costochondral fx, small anterior med hematoma. Ct chest on arrival shows There is a right-sided pneumothorax, estimated to be 5% of lung volume, seen primarily along the medial aspect of the lung. Right chest wall subcutaneous gas is present primarily along the anterior aspect of the  chest. There is a small right pleural effusion. Minimal subsegmental atelectasis identified at the right lung base. Within the right lower lobe a pulmonary nodule measures 6 mm. Family reproted to MD that pt does not drink much due to fear of choking, frequent choking. UGI on 2007 showed dysmotility, hypertrophied cricopharyngeus muscle.  No Data Recorded Assessment / Plan / Recommendation CHL IP CLINICAL IMPRESSIONS 08/19/2016 Clinical  Impression [name] presents with a suspected esophageal dysphagia (no penetration/aspiration during this study). Oral phase impairments included premature spillage with thin liquids via straw. Initial swallow of thin consecutive cup sips resulted in sudden coughing, although no penetrates/aspirates on fluoro. Vallecular and pyriform sinus residue throughout the exam, however pt was able to clear residuals with reflexive second swallows. Mastication of solids was timely with no oral impairments and mild vallecular residue. The barium pill became briefly lodged at the level of the CP segment but then cleared into the esophagus, although the pill remained stagnant in the esophagus and noted backflow during esophageal scan (MBS does not diagnose below the level of the UES). Educated pt and daughter re: results of assessment and esophageal precautions (remain seated upright after meals, follow solids with liquids, possible GI consult). No further ST needed.  SLP Visit Diagnosis Dysphagia, pharyngoesophageal phase (R13.14) Attention and concentration deficit following -- Frontal lobe and executive function deficit following -- Impact on safety and function Mild aspiration risk   CHL IP TREATMENT RECOMMENDATION 08/19/2016 Treatment Recommendations No treatment recommended at this time   Prognosis 08/15/2016 Prognosis for Safe Diet Advancement Good Barriers to Reach Goals -- Barriers/Prognosis Comment -- CHL IP DIET RECOMMENDATION 08/19/2016 SLP Diet Recommendations Regular solids;Thin liquid  Liquid Administration via Cup;Straw Medication Administration Whole meds with liquid Compensations Slow rate;Small sips/bites Postural Changes Remain semi-upright after after feeds/meals (Comment);Seated upright at 90 degrees   CHL IP OTHER RECOMMENDATIONS 08/19/2016 Recommended Consults Consider GI evaluation Oral Care Recommendations Oral care BID Other Recommendations --   CHL IP FOLLOW UP RECOMMENDATIONS 08/19/2016 Follow up Recommendations None   CHL IP FREQUENCY AND DURATION 08/15/2016 Speech Therapy Frequency (ACUTE ONLY) min 2x/week Treatment Duration 2 weeks      CHL IP ORAL PHASE 08/19/2016 Oral Phase Impaired Oral - Pudding Teaspoon -- Oral - Pudding Cup -- Oral - Honey Teaspoon -- Oral - Honey Cup -- Oral - Nectar Teaspoon -- Oral - Nectar Cup -- Oral - Nectar Straw -- Oral - Thin Teaspoon -- Oral - Thin Cup WFL Oral - Thin Straw Premature spillage Oral - Puree -- Oral - Mech Soft -- Oral - Regular WFL Oral - Multi-Consistency -- Oral - Pill WFL Oral Phase - Comment --  CHL IP PHARYNGEAL PHASE 08/19/2016 Pharyngeal Phase Impaired Pharyngeal- Pudding Teaspoon -- Pharyngeal -- Pharyngeal- Pudding Cup -- Pharyngeal -- Pharyngeal- Honey Teaspoon -- Pharyngeal -- Pharyngeal- Honey Cup -- Pharyngeal -- Pharyngeal- Nectar Teaspoon -- Pharyngeal -- Pharyngeal- Nectar Cup -- Pharyngeal -- Pharyngeal- Nectar Straw -- Pharyngeal -- Pharyngeal- Thin Teaspoon -- Pharyngeal -- Pharyngeal- Thin Cup Pharyngeal residue - pyriform;Pharyngeal residue - valleculae;Reduced tongue base retraction;Reduced laryngeal elevation Pharyngeal -- Pharyngeal- Thin Straw Pharyngeal residue - pyriform;Reduced laryngeal elevation Pharyngeal -- Pharyngeal- Puree -- Pharyngeal -- Pharyngeal- Mechanical Soft -- Pharyngeal -- Pharyngeal- Regular Pharyngeal residue - valleculae;Reduced tongue base retraction Pharyngeal -- Pharyngeal- Multi-consistency -- Pharyngeal -- Pharyngeal- Pill WFL Pharyngeal -- Pharyngeal Comment --  CHL IP CERVICAL ESOPHAGEAL  PHASE 08/19/2016 Cervical Esophageal Phase Impaired Pudding Teaspoon -- Pudding Cup -- Honey Teaspoon -- Honey Cup -- Nectar Teaspoon -- Nectar Cup -- Nectar Straw -- Thin Teaspoon -- Thin Cup -- Thin Straw -- Puree -- Mechanical Soft -- Regular -- Multi-consistency -- Pill -- Cervical Esophageal Comment -- No flowsheet data found. Houston Siren 08/19/2016, 1:31 PM Orbie Pyo Colvin Caroli.Ed CCC-SLP Pager [phone]               Assessment: 1. Ileus secondary to trauma,  immobility and pain medication 2. Antecedent dysphagia which appears relatively mild and probably unchanged since admission Plan:  1. Discussed with Dr. Dalbert Batman who is discontinued pain medicine will order enemas. 2. Will keep clear liquids for now 3. We will reexamine and repeat KUB tomorrow 4. Consider rectal tube if unimproved Possible standard barium swallow and/or EGD at some point in the futureHAYES,Maeryn Mcgath C 08/20/2016, 9:55 AM  Pager [phone] If no answer or after 5 PM call [phone]

## 2016-08-20 NOTE — Progress Notes (Addendum)
Subjective: Complains of abdominal pain greater than chest pain.  Some diaphoresis.  Family members present and quite concerned. Having some problems with swallowing.  No vomiting MBS yesterday suggested some problems with cricopharyngeus but could not evaluate more distally.  Complains of abdominal distention and abdominal pain.  Abdominal x-rays yesterday showed mild colonic ileus but nothing more unusual.  Says he hasn't had much of a bowel movement.  BP 156/82.  SPO2 95% room air.  Resp. rate 19.  Pulse rate 88 and regular.  Afebrile. No lab work ordered this morning.  Objective: Vital signs in last 24 hours: Temp:  [97.6 F (36.4 C)-98 F (36.7 C)] 97.8 F (36.6 C) (03/10 0534) Pulse Rate:  [73-88] 88 (03/10 0534) Resp:  [18-19] 19 (03/10 0534) BP: (140-156)/(59-82) 156/82 (03/10 0534) SpO2:  [94 %-95 %] 95 % (03/10 0534) Last BM Date: 08/17/16 (Simultaneous filing. User may not have seen previous data.)  Intake/Output from previous day: 03/09 0701 - 03/10 0700 In: 813.5 [P.O.:286; I.V.:527.5] Out: 920 [Urine:920] Intake/Output this shift: No intake/output data recorded.    EXAM: General appearance: Older gentleman.  Some language barrier but two family members present to translate(daughter, and a niece who is a [facility]ist.).  Moderate distress of abdominal discomfort.  Some diaphoresis. Neck: no adenopathy, no carotid bruit, no JVD, supple, symmetrical, trachea midline, thyroid not enlarged, symmetric, no tenderness/mass/nodules and Ecchymoses right neck but no swelling.  Trachea midline Chest wall: no tenderness, Palpable crepitus right anterior chest Cardio: regular rate and rhythm, S1, S2 normal, no murmur, click, rub or gallop GI: Abdomen soft but distended.  Active bowel sounds.  No peritoneal signs.  Mild discomfort from distention.  No mass or hernia detected  Lab Results:  No results for input(s): WBC, HGB, HCT, PLT in the last 72 hours. BMET  Recent  Labs  08/18/16 0342 08/20/16 0513  NA 140  --   K 4.6  --   CL 107  --   CO2 27  --   GLUCOSE 98  --   BUN 20  --   CREATININE 1.03 1.00  CALCIUM 8.4*  --    PT/INR No results for input(s): LABPROT, INR in the last 72 hours. ABG No results for input(s): PHART, HCO3 in the last 72 hours.  Invalid input(s): PCO2, PO2  Studies/Results: Dg Abd Portable 1v  Result Date: 08/19/2016 CLINICAL DATA:  Adynamic ileus EXAM: PORTABLE ABDOMEN - 1 VIEW COMPARISON:  CT scan August 13, 2016 FINDINGS: Contrast is seen in the cecum, proximal ascending colon, and stomach. Air-filled mildly prominent loops of large and small bowel are seen, possibly representing a mild ileus. No definitive obstruction. IMPRESSION: Probable mild ileus. Electronically Signed   By: Dorise Bullion III M.D   On: 08/19/2016 16:40   Dg Swallowing Func-speech Pathology  Result Date: 08/19/2016 Carterm5 Objective Swallowing Evaluation: Type of Study: MBS-Modified Barium Swallow Study Patient Details Name: [name] MRN: [id number] Date of Birth: 1927/08/03 Today's Date: 08/19/2016 Time: SLP Start Time (ACUTE ONLY): 1040-SLP Stop Time (ACUTE ONLY): 1104 SLP Time Calculation (min) (ACUTE ONLY): 24 min Past Medical History: Past Medical History: Diagnosis Date . At risk for sleep apnea   STOP-BANG= 4    SENT TO PCP 04-04-2014 . BPH (benign prostatic hypertrophy)  . Depression  . Diverticulosis of colon  . GERD (gastroesophageal reflux disease)  . Gilbert's syndrome  . Headache   migraines . Hearing loss  . Heart murmur   "Calcified aortic valve" . Hepatitis  Hep A as a child . History of gastroenteritis  . History of kidney stones  . History of syncope   2002--  vasovagal . HOH (hard of hearing)   refuses to wear his Hearing Aid . Hypertension  . Inguinal hernia   right . Internal hemorrhoid, bleeding  . Moderate aortic stenosis   a. Last echo 11/2013, 06/2014. Marland Kitchen Nocturia  . Normal coronary arteries   a. Cath 07/2010: normal coronaries. Felt to  have noncardiac CP/SOB at that time possibly r/t anxiety. . RA (rheumatoid arthritis) ([place])   bilateral hands/ wrist--  seronegative . Rotator cuff syndrome of right shoulder 08/01/2013 . Shortness of breath dyspnea   with exertion . Stone, kidney  . Thyroid cyst  . Thyroid nodule   noted 11-2009 . Wears dentures  Past Surgical History: Past Surgical History: Procedure Laterality Date . CARDIAC CATHETERIZATION  07-19-2010  dr Tressia Miners turner  normal coronary arteries,  ef 60%,  moderate aortic stenosis- gradient 58mHg, normal right heart pressure . CARDIOVASCULAR STRESS TEST  05/ 2011   dr sMarlou Porch normal low risk perfusion study . CATARACT EXTRACTION W/ INTRAOCULAR LENS  IMPLANT, BILATERAL  2013 . COLONOSCOPY  2010  approx . THYROID LOBECTOMY Right 05/05/2015  Procedure: RIGHT THYROID LOBECTOMY;  Surgeon: TArmandina Gemma MD;  [facility]  Service: General;  Laterality: Right; . TRANSANAL HEMORRHOIDAL DEARTERIALIZATION N/A 04/09/2014  Procedure: TRANSANAL HEMORRHOIDAL DEARTERIALIZATION OF INTERNAL HEMORROIDS;  Surgeon: ALeighton Ruff MD;  [facility]  Service: General;  Laterality: N/A; . TRANSTHORACIC ECHOCARDIOGRAM  11-26-2013  moderate focal basal and mild LVH/  ef 650-27%/ grade I diastolic dysfunction/ mild LAE/  moderate calcification with stenosis AV with mild regurg,  gradients 35 abd 58 mmHg /  mild TR HPI: 88yom s/p fall from tree. Through his daughter he climbed about 25 feet then lost his stand. While trying to climb down he fell from about 3 meters. Sustained right rib fx, pneumothorax, costochondral fx, small anterior med hematoma. Ct chest on arrival shows There is a right-sided pneumothorax, estimated to be 5% of lung volume, seen primarily along the medial aspect of the lung. Right chest wall subcutaneous gas is present primarily along the anterior aspect of the chest. There is a small right pleural effusion. Minimal subsegmental atelectasis identified at the right lung  base. Within the right lower lobe a pulmonary nodule measures 6 mm. Family reproted to MD that pt does not drink much due to fear of choking, frequent choking. UGI on 2007 showed dysmotility, hypertrophied cricopharyngeus muscle.  No Data Recorded Assessment / Plan / Recommendation CHL IP CLINICAL IMPRESSIONS 08/19/2016 Clinical Impression Mr. IGeskepresents with a suspected esophageal dysphagia (no penetration/aspiration during this study). Oral phase impairments included premature spillage with thin liquids via straw. Initial swallow of thin consecutive cup sips resulted in sudden coughing, although no penetrates/aspirates on fluoro. Vallecular and pyriform sinus residue throughout the exam, however pt was able to clear residuals with reflexive second swallows. Mastication of solids was timely with no oral impairments and mild vallecular residue. The barium pill became briefly lodged at the level of the CP segment but then cleared into the esophagus, although the pill remained stagnant in the esophagus and noted backflow during esophageal scan (MBS does not diagnose below the level of the UES). Educated pt and daughter re: results of assessment and esophageal precautions (remain seated upright after meals, follow solids with liquids, possible GI consult). No further ST needed.  SLP Visit Diagnosis Dysphagia,  pharyngoesophageal phase (R13.14) Attention and concentration deficit following -- Frontal lobe and executive function deficit following -- Impact on safety and function Mild aspiration risk   CHL IP TREATMENT RECOMMENDATION 08/19/2016 Treatment Recommendations No treatment recommended at this time   Prognosis 08/15/2016 Prognosis for Safe Diet Advancement Good Barriers to Reach Goals -- Barriers/Prognosis Comment -- CHL IP DIET RECOMMENDATION 08/19/2016 SLP Diet Recommendations Regular solids;Thin liquid Liquid Administration via Cup;Straw Medication Administration Whole meds with liquid Compensations Slow  rate;Small sips/bites Postural Changes Remain semi-upright after after feeds/meals (Comment);Seated upright at 90 degrees   CHL IP OTHER RECOMMENDATIONS 08/19/2016 Recommended Consults Consider GI evaluation Oral Care Recommendations Oral care BID Other Recommendations --   CHL IP FOLLOW UP RECOMMENDATIONS 08/19/2016 Follow up Recommendations None   CHL IP FREQUENCY AND DURATION 08/15/2016 Speech Therapy Frequency (ACUTE ONLY) min 2x/week Treatment Duration 2 weeks      CHL IP ORAL PHASE 08/19/2016 Oral Phase Impaired Oral - Pudding Teaspoon -- Oral - Pudding Cup -- Oral - Honey Teaspoon -- Oral - Honey Cup -- Oral - Nectar Teaspoon -- Oral - Nectar Cup -- Oral - Nectar Straw -- Oral - Thin Teaspoon -- Oral - Thin Cup WFL Oral - Thin Straw Premature spillage Oral - Puree -- Oral - Mech Soft -- Oral - Regular WFL Oral - Multi-Consistency -- Oral - Pill WFL Oral Phase - Comment --  CHL IP PHARYNGEAL PHASE 08/19/2016 Pharyngeal Phase Impaired Pharyngeal- Pudding Teaspoon -- Pharyngeal -- Pharyngeal- Pudding Cup -- Pharyngeal -- Pharyngeal- Honey Teaspoon -- Pharyngeal -- Pharyngeal- Honey Cup -- Pharyngeal -- Pharyngeal- Nectar Teaspoon -- Pharyngeal -- Pharyngeal- Nectar Cup -- Pharyngeal -- Pharyngeal- Nectar Straw -- Pharyngeal -- Pharyngeal- Thin Teaspoon -- Pharyngeal -- Pharyngeal- Thin Cup Pharyngeal residue - pyriform;Pharyngeal residue - valleculae;Reduced tongue base retraction;Reduced laryngeal elevation Pharyngeal -- Pharyngeal- Thin Straw Pharyngeal residue - pyriform;Reduced laryngeal elevation Pharyngeal -- Pharyngeal- Puree -- Pharyngeal -- Pharyngeal- Mechanical Soft -- Pharyngeal -- Pharyngeal- Regular Pharyngeal residue - valleculae;Reduced tongue base retraction Pharyngeal -- Pharyngeal- Multi-consistency -- Pharyngeal -- Pharyngeal- Pill WFL Pharyngeal -- Pharyngeal Comment --  CHL IP CERVICAL ESOPHAGEAL PHASE 08/19/2016 Cervical Esophageal Phase Impaired Pudding Teaspoon -- Pudding Cup -- Honey Teaspoon --  Honey Cup -- Nectar Teaspoon -- Nectar Cup -- Nectar Straw -- Thin Teaspoon -- Thin Cup -- Thin Straw -- Puree -- Mechanical Soft -- Regular -- Multi-consistency -- Pill -- Cervical Esophageal Comment -- No flowsheet data found. Houston Siren 08/19/2016, 1:31 PM Orbie Pyo Colvin Caroli.Ed CCC-SLP Pager [phone]               Anti-infectives: Anti-infectives    None      Assessment/Plan:   20-25 foot fall from tree Right PTX with subcutaneous emphysema- continue incentive spirometry.  Add nasal 02 due to chest pain and diaphoresis.  Repeat 2 view chest x-ray today e Right sided costochondral fractures (4-6) sternum is intact Small anterior mediastinal hematoma- stable Right ingunial hernia containing loops of small bowel and colonic diverticulosis- Incidental finding on CT  Constipation- Resolved, large BM 08/17/16, positive bowel sounds to all quadrants  Abdominal Distention - pt having lots of flatus  Difficulty swallowing.  Family is concerned about esophageal stricture.  MBS suggest cricopharyngeus dysfunction.  I have asked Dr. Teena Irani from [facility] to evaluate for possible endoscopy   Chest pain and diaphoresis.  Suspect this is due to his trauma and anxiety, but will evaluate from cardiopulmonary standpoint Repeat chest x-ray stat Nasal 02 EKG and  troponins that I have called [facility], and they're coming to see now CBC, complete met about a panel, lipase  Abdominal pain.  Radiographically and clinically this seems like a colonic ileus. Narcotics discontinued and  family and patient advised... tramadol only Milk and molasses enema   VTE- Lovenox, SCD FEN-  IVF,limit  oral intake, speech therapy recommends thin liquids  Pain-discontinue narcotics as mentioned above.  This may be having a profound effect on intestinal motility., Tramadol 572m every 6hrs,Ibuprofen 8050mevery 8hrs PRN,Cyclobenaprine 72m41m times daily Acetaminophen 650m56mery  6hrs  Disposition/Plan- PT/OT, mobilize, pain control, not ready for discharge.  I have personally spent 55 minutes with the patient, his family, and coordinating care and making phone calls.   LOS: 7 days    [name] M 08/20/2016

## 2016-08-21 ENCOUNTER — Inpatient Hospital Stay (HOSPITAL_COMMUNITY): Payer: Medicare Other

## 2016-08-21 DIAGNOSIS — R079 Chest pain, unspecified: Secondary | ICD-10-CM

## 2016-08-21 LAB — PREPARE RBC (CROSSMATCH)

## 2016-08-21 LAB — TROPONIN I
Troponin I: 0.1 ng/mL (ref <0.03)
Troponin I: 0.1 ng/mL (ref <0.03)

## 2016-08-21 LAB — CBC
HCT: 22.3 % — ABNORMAL LOW (ref 39.0–52.0)
Hemoglobin: 7.2 g/dL — ABNORMAL LOW (ref 13.0–17.0)
MCH: 29.4 pg (ref 26.0–34.0)
MCHC: 32.3 g/dL (ref 30.0–36.0)
MCV: 91 fL (ref 78.0–100.0)
PLATELETS: 234 10*3/uL (ref 150–400)
RBC: 2.45 MIL/uL — ABNORMAL LOW (ref 4.22–5.81)
RDW: 13.6 % (ref 11.5–15.5)
WBC: 6.8 10*3/uL (ref 4.0–10.5)

## 2016-08-21 LAB — MAGNESIUM: MAGNESIUM: 2.1 mg/dL (ref 1.7–2.4)

## 2016-08-21 LAB — COMPREHENSIVE METABOLIC PANEL
ALT: 34 U/L (ref 17–63)
ANION GAP: 6 (ref 5–15)
AST: 39 U/L (ref 15–41)
Albumin: 2.7 g/dL — ABNORMAL LOW (ref 3.5–5.0)
Alkaline Phosphatase: 50 U/L (ref 38–126)
BUN: 27 mg/dL — ABNORMAL HIGH (ref 6–20)
CHLORIDE: 106 mmol/L (ref 101–111)
CO2: 25 mmol/L (ref 22–32)
Calcium: 8.1 mg/dL — ABNORMAL LOW (ref 8.9–10.3)
Creatinine, Ser: 1.87 mg/dL — ABNORMAL HIGH (ref 0.61–1.24)
GFR, EST AFRICAN AMERICAN: 36 mL/min — AB (ref >60)
GFR, EST NON AFRICAN AMERICAN: 31 mL/min — AB (ref >60)
Glucose, Bld: 162 mg/dL — ABNORMAL HIGH (ref 65–99)
POTASSIUM: 4.8 mmol/L (ref 3.5–5.1)
SODIUM: 137 mmol/L (ref 135–145)
Total Bilirubin: 1.4 mg/dL — ABNORMAL HIGH (ref 0.3–1.2)
Total Protein: 5.3 g/dL — ABNORMAL LOW (ref 6.5–8.1)

## 2016-08-21 LAB — ECHOCARDIOGRAM COMPLETE
HEIGHTINCHES: 69 in
WEIGHTICAEL: 2786.61 [oz_av]

## 2016-08-21 LAB — ABO/RH: ABO/RH(D): B POS

## 2016-08-21 LAB — HEMOGLOBIN AND HEMATOCRIT, BLOOD
HEMATOCRIT: 21.9 % — AB (ref 39.0–52.0)
Hemoglobin: 7.1 g/dL — ABNORMAL LOW (ref 13.0–17.0)

## 2016-08-21 MED ORDER — BISACODYL 10 MG RE SUPP
10.0000 mg | Freq: Every day | RECTAL | Status: DC | PRN
Start: 2016-08-21 — End: 2016-08-28
  Administered 2016-08-27: 10 mg via RECTAL
  Filled 2016-08-21: qty 1

## 2016-08-21 MED ORDER — BISACODYL 10 MG RE SUPP
10.0000 mg | Freq: Once | RECTAL | Status: AC
  Administered 2016-08-21: 10 mg via RECTAL
  Filled 2016-08-21: qty 1

## 2016-08-21 MED ORDER — KETOROLAC TROMETHAMINE 15 MG/ML IJ SOLN
15.0000 mg | Freq: Four times a day (QID) | INTRAMUSCULAR | Status: AC
  Administered 2016-08-21 – 2016-08-25 (×19): 15 mg via INTRAVENOUS
  Filled 2016-08-21 (×20): qty 1

## 2016-08-21 MED ORDER — PERFLUTREN LIPID MICROSPHERE
1.0000 mL | INTRAVENOUS | Status: AC | PRN
  Administered 2016-08-21: 2 mL via INTRAVENOUS
  Filled 2016-08-21: qty 10

## 2016-08-21 MED ORDER — IOPAMIDOL (ISOVUE-300) INJECTION 61%
15.0000 mL | INTRAVENOUS | Status: AC
  Administered 2016-08-21: 15 mL via ORAL

## 2016-08-21 MED ORDER — SODIUM CHLORIDE 0.9 % IV SOLN: Freq: Once | INTRAVENOUS | Status: DC

## 2016-08-21 NOTE — Progress Notes (Signed)
16Fr. NGT placed on low intermittent suctioning. Pt tolerated.

## 2016-08-21 NOTE — Progress Notes (Signed)
  Echocardiogram 2D Echocardiogram has been performed.  Jennette Dubin 08/21/2016, 4:24 PM

## 2016-08-21 NOTE — Progress Notes (Signed)
PROGRESS NOTE    [name]  XNA:[id number] DOB: 1928/03/08 DOA: 08/13/2016 PCP: [name], MD   Chief Complaint  Patient presents with  . Trauma     Brief Narrative:  Consulted for medical management.   Assessment & Plan   Anemia -?Blood loss, internal bleeding -CT abd/pelvis pending -FOBT pending -Anemia panel pending -Hemoglobin on admission 13, currently 7 -Will tranfuse 2 u and monitor CBC -GI and surg following   Esophageal dysphagia and ileus -defer to primary team and gastroenterology -Pending CT abd/pelvis -Continue bowel regimen -Had NG tube, but pulled it out -Denies nausea/vomiting -Feels abd pain is improving today -discontinued regular diet and placed on liquid diet  Right pneumothorax -5% of lung volume, present on admission -was conservative treated. -Continue nebs and incentive spirometry, pulm toilet   Elevated troponin/Transient diaphoresis today, w/ diffuse pains -felt multifactorial, including pain, ileus, anemia, demand ischemia -Pending echocardiogram -Cardiology consulted  -Troponin mildly elevated, but flat  Hypertension -Felt may be exacerbated by pain/ileus as well. -continue HCTZ and irbesartan   Hyperlipidemina -continue asa and statin  GERD -Continue PPI  Depression -Continue On lexapro  DVT Prophylaxis  Discontinued lovenox given anemia, SCDs  Code Status: Full  Family Communication: Family at bedside, and on the phone. Updated  Disposition Plan: Admitted, per primary  Consultants [facility] Gastroenterology   Procedures    Antibiotics   Anti-infectives    None      Subjective:   [name] seen and examined today. Feeling better today. Feels no nausea or vomiting or chest pain. Feels breathing has improved mildly.  Objective:   Vitals:   08/21/16 0812 08/21/16 0937 08/21/16 1143 08/21/16 1243  BP: (!) 109/56 131/61 128/62 (!) 105/48  Pulse: 69 77 71 74  Resp: 15 20 18 19    Temp: 97.6 F (36.4 C)  97.9 F (36.6 C) 98.4 F (36.9 C)  TempSrc: Axillary  Axillary   SpO2: 98% 100% 99% 100%  Weight:      Height:        Intake/Output Summary (Last 24 hours) at 08/21/16 1249 Last data filed at 08/21/16 0400  Gross per 24 hour  Intake           1242.5 ml  Output              100 ml  Net           1142.5 ml   Filed Weights   08/13/16 1606 08/21/16 0351  Weight: 77.1 kg (170 lb) 79 kg (174 lb 2.6 oz)    Exam  General: Well developed, well nourished, NAD, appears stated age  HEENT: NCAT, mucous membranes moist.   Cardiovascular: S1 S2 auscultated, no rubs, murmurs or gallops. Regular rate and rhythm.  MSK chest wall TTP   Respiratory: Clear to auscultation bilaterally with equal chest rise  Abdomen: Soft, nontender, distended, + bowel sounds  Extremities: warm dry without cyanosis clubbing or edema  Neuro: AAOx3, cranial nerves grossly intact. Strength 5/5 in patient's upper and lower extremities bilaterally  Skin: Without rashes exudates or nodules  Psych: Normal affect and demeanor with intact judgement and insight   Data Reviewed: I have personally reviewed following labs and imaging studies  CBC:  Recent Labs Lab 08/20/16 1026 08/21/16 0313 08/21/16 0511  WBC 8.9 6.8  --   HGB 9.7* 7.2* 7.1*  HCT 30.3* 22.3* 21.9*  MCV 91.0 91.0  --   PLT 242 234  --    Basic Metabolic Panel:  Recent Labs Lab 08/16/16 0357 08/17/16 0218 08/18/16 0342 08/20/16 0513 08/20/16 1026 08/21/16 0313 08/21/16 0844  NA 137 137 140  --  137 137  --   K 3.2* 4.4 4.6  --  4.2 4.8  --   CL 101 105 107  --  104 106  --   CO2 27 26 27   --  23 25  --   GLUCOSE 118* 110* 98  --  142* 162*  --   BUN 16 21* 20  --  13 27*  --   CREATININE 0.98 1.19 1.03 1.00 1.06 1.87*  --   CALCIUM 8.5* 8.2* 8.4*  --  8.3* 8.1*  --   MG  --   --   --   --   --   --  2.1   GFR: Estimated Creatinine Clearance: 28.4 mL/min (by C-G formula based on SCr of 1.87 mg/dL  (H)). Liver Function Tests:  Recent Labs Lab 08/20/16 1026 08/21/16 0313  AST 42* 39  ALT 39 34  ALKPHOS 54 50  BILITOT 2.1* 1.4*  PROT 5.9* 5.3*  ALBUMIN 3.0* 2.7*    Recent Labs Lab 08/20/16 1026  LIPASE 14   No results for input(s): AMMONIA in the last 168 hours. Coagulation Profile: No results for input(s): INR, PROTIME in the last 168 hours. Cardiac Enzymes:  Recent Labs Lab 08/20/16 1026 08/20/16 1458 08/20/16 1905 08/21/16 0313 08/21/16 0511  TROPONINI 0.05* 0.13* 0.14* 0.10* 0.10*   BNP (last 3 results) No results for input(s): PROBNP in the last 8760 hours. HbA1C: No results for input(s): HGBA1C in the last 72 hours. CBG: No results for input(s): GLUCAP in the last 168 hours. Lipid Profile: No results for input(s): CHOL, HDL, LDLCALC, TRIG, CHOLHDL, LDLDIRECT in the last 72 hours. Thyroid Function Tests: No results for input(s): TSH, T4TOTAL, FREET4, T3FREE, THYROIDAB in the last 72 hours. Anemia Panel: No results for input(s): VITAMINB12, FOLATE, FERRITIN, TIBC, IRON, RETICCTPCT in the last 72 hours. Urine analysis:    Component Value Date/Time   COLORURINE YELLOW 08/13/2016 1820   APPEARANCEUR CLEAR 08/13/2016 1820   LABSPEC 1.038 (H) 08/13/2016 1820   PHURINE 5.0 08/13/2016 1820   GLUCOSEU NEGATIVE 08/13/2016 1820   HGBUR LARGE (A) 08/13/2016 1820   BILIRUBINUR NEGATIVE 08/13/2016 1820   KETONESUR 20 (A) 08/13/2016 1820   PROTEINUR NEGATIVE 08/13/2016 1820   UROBILINOGEN 1.0 04/14/2014 0116   NITRITE NEGATIVE 08/13/2016 1820   LEUKOCYTESUR NEGATIVE 08/13/2016 1820   Sepsis Labs: @LABRCNTIP (procalcitonin:4,lacticidven:4)  ) Recent Results (from the past 240 hour(s))  MRSA PCR Screening     Status: None   Collection Time: 08/14/16  1:35 AM  Result Value Ref Range Status   MRSA by PCR NEGATIVE NEGATIVE Final    Comment:        The GeneXpert MRSA Assay (FDA approved for NASAL specimens only), is one component of a comprehensive MRSA  colonization surveillance program. It is not intended to diagnose MRSA infection nor to guide or monitor treatment for MRSA infections.       Radiology Studies: Dg Chest 2 View  Result Date: 08/20/2016 CLINICAL DATA:  Chest pain and diaphoresis. History of hypertension, aortic stenosis, ileus. Former smoker. EXAM: CHEST  2 VIEW COMPARISON:  Chest x-rays dated 08/15/2016 and 04/29/2015. Comparison also made to chest CT of 08/13/2016. FINDINGS: Study is hypoinspiratory with crowding of the perihilar and bibasilar bronchovascular markings. Suspect at least mild edema and/or atelectasis within the right lower lung, versus asymmetrically prominent  hypoinspiratory changes. Right pleural effusion, small to moderate in size. Heart size and mediastinal contours are stable. Again noted is subcutaneous emphysema overlying the right chest wall, similar to previous exams. No pneumothorax seen. IMPRESSION: 1. Low lung volumes. Right pleural effusion, small to moderate in size. Suspect additional edema and/or atelectasis at the right lung base. 2. Subcutaneous emphysema overlying the right chest wall, similar to previous exams. 3. No pneumothorax seen, possibly resolved since earlier exams, possibly obscured by the overlying subcutaneous emphysema. Electronically Signed   By: Franki Cabot M.D.   On: 08/20/2016 11:59   Dg Abd 1 View  Result Date: 08/21/2016 CLINICAL DATA:  Ileus, NG tube placement EXAM: ABDOMEN - 1 VIEW COMPARISON:  08/20/2016 FINDINGS: A gastric tube tip and side-port are seen in the proximal body of the stomach. Contrast filled large bowel is noted with filling of the appendix. Dilated small bowel loops are present within the abdomen and pelvis. Findings are in keeping with an ileus given persistent oral contrast within large bowel. IMPRESSION: Gastric tube in the proximal stomach. Bowel gas pattern suggests an ileus. Electronically Signed   By: Ashley Royalty M.D.   On: 08/21/2016 03:34   Dg Abd  2 Views  Result Date: 08/21/2016 CLINICAL DATA:  Ileus EXAM: ABDOMEN - 2 VIEW COMPARISON:  08/21/2016 FINDINGS: Contrast material remains throughout the colon similar to that seen on the prior exam from a prior CT study. The nasogastric catheter has been removed in the interval. Right-sided pleural effusion is again identified. No obstructive changes are seen. No free air is noted. Degenerative changes of the lumbar spine are seen. IMPRESSION: No acute abnormality is noted aside from a right pleural effusion. Stable contrast throughout the colon. Electronically Signed   By: Inez Catalina M.D.   On: 08/21/2016 08:46   Dg Abd Portable 1v  Result Date: 08/20/2016 CLINICAL DATA:  Nausea and abdominal pain EXAM: PORTABLE ABDOMEN - 1 VIEW COMPARISON:  Abdominal radiograph 08/19/2016 FINDINGS: There is contrast material within the ascending colon and appendix. There is a large amount of stool throughout the remainder of the colon. Gas-filled loops of small bowel are again seen. IMPRESSION: Large amount of stool in the colon with contrast material opacifying the ascending colon. Multiple gas-filled loops of small bowel. Findings are again suggestive of adynamic ileus. Electronically Signed   By: Ulyses Jarred M.D.   On: 08/20/2016 20:18   Dg Abd Portable 1v  Result Date: 08/19/2016 CLINICAL DATA:  Adynamic ileus EXAM: PORTABLE ABDOMEN - 1 VIEW COMPARISON:  CT scan August 13, 2016 FINDINGS: Contrast is seen in the cecum, proximal ascending colon, and stomach. Air-filled mildly prominent loops of large and small bowel are seen, possibly representing a mild ileus. No definitive obstruction. IMPRESSION: Probable mild ileus. Electronically Signed   By: Dorise Bullion III M.D   On: 08/19/2016 16:40     Scheduled Meds: . sodium chloride   Intravenous Once  . aspirin EC  81 mg Oral Daily  . bisacodyl  10 mg Rectal Once  . cyclobenzaprine  5 mg Oral TID  . docusate sodium  100 mg Oral BID  . enoxaparin (LOVENOX)  injection  40 mg Subcutaneous Q24H  . escitalopram  20 mg Oral Daily  . feeding supplement (ENSURE ENLIVE)  237 mL Oral BID BM  . hydrochlorothiazide  12.5 mg Oral Daily  . irbesartan  75 mg Oral Daily  . ketorolac  15 mg Intravenous Q6H  . pantoprazole  40 mg Oral Daily  .  polyethylene glycol  17 g Oral Daily  . simvastatin  20 mg Oral QHS  . traMADol  50 mg Oral Q6H   Continuous Infusions: . 0.9 % NaCl with KCl 20 mEq / L 50 mL/hr at 08/20/16 1116  . dextrose 5 % and 0.9% NaCl 125 mL/hr at 08/21/16 0024     LOS: 8 days   Time Spent in minutes   30 minutes  Andres Bantz D.O. on 08/21/2016 at 12:49 PM  Between 7am to 7pm - Pager - [phone]  After 7pm go to [email or web address] - password [email or web address]  And look for the night coverage person covering for me after hours  [facility] Office  [phone]

## 2016-08-21 NOTE — Progress Notes (Addendum)
Subjective: Pt transferred to step down  Pulled out his NGT Says he feels better than last night    Objective: Vital signs in last 24 hours: Temp:  [97.2 F (36.2 C)-98.3 F (36.8 C)] 97.2 F (36.2 C) (03/11 0351) Pulse Rate:  [70-97] 70 (03/11 0351) Resp:  [17-19] 17 (03/11 0351) BP: (91-110)/(44-58) 105/44 (03/11 0351) SpO2:  [95 %-100 %] 99 % (03/11 0351) Weight:  [79 kg (174 lb 2.6 oz)] 79 kg (174 lb 2.6 oz) (03/11 0351) Last BM Date: 08/20/16  Intake/Output from previous day: 03/10 0701 - 03/11 0700 In: 1242.5 [P.O.:120; I.V.:1122.5] Out: 250 [Urine:250] Intake/Output this shift: Total I/O In: 650 [I.V.:650] Out: -   General appearance: alert and cooperative Resp: clear to auscultation bilaterally Chest wall: no tenderness Cardio: regular rate and rhythm, S1, S2 normal, no murmur, click, rub or gallop GI: soft distended but less tender Fullness noted above pubis no peritonitis  No rebound or guarding  Lab Results:   Recent Labs  08/20/16 1026 08/21/16 0313 08/21/16 0511  WBC 8.9 6.8  --   HGB 9.7* 7.2* 7.1*  HCT 30.3* 22.3* 21.9*  PLT 242 234  --    BMET  Recent Labs  08/20/16 1026 08/21/16 0313  NA 137 137  K 4.2 4.8  CL 104 106  CO2 23 25  GLUCOSE 142* 162*  BUN 13 27*  CREATININE 1.06 1.87*  CALCIUM 8.3* 8.1*   PT/INR No results for input(s): LABPROT, INR in the last 72 hours. ABG No results for input(s): PHART, HCO3 in the last 72 hours.  Invalid input(s): PCO2, PO2  Studies/Results: Dg Chest 2 View  Result Date: 08/20/2016 CLINICAL DATA:  Chest pain and diaphoresis. History of hypertension, aortic stenosis, ileus. Former smoker. EXAM: CHEST  2 VIEW COMPARISON:  Chest x-rays dated 08/15/2016 and 04/29/2015. Comparison also made to chest CT of 08/13/2016. FINDINGS: Study is hypoinspiratory with crowding of the perihilar and bibasilar bronchovascular markings. Suspect at least mild edema and/or atelectasis within the right lower lung,  versus asymmetrically prominent hypoinspiratory changes. Right pleural effusion, small to moderate in size. Heart size and mediastinal contours are stable. Again noted is subcutaneous emphysema overlying the right chest wall, similar to previous exams. No pneumothorax seen. IMPRESSION: 1. Low lung volumes. Right pleural effusion, small to moderate in size. Suspect additional edema and/or atelectasis at the right lung base. 2. Subcutaneous emphysema overlying the right chest wall, similar to previous exams. 3. No pneumothorax seen, possibly resolved since earlier exams, possibly obscured by the overlying subcutaneous emphysema. Electronically Signed   By: Franki Cabot M.D.   On: 08/20/2016 11:59   Dg Abd 1 View  Result Date: 08/21/2016 CLINICAL DATA:  Ileus, NG tube placement EXAM: ABDOMEN - 1 VIEW COMPARISON:  08/20/2016 FINDINGS: A gastric tube tip and side-port are seen in the proximal body of the stomach. Contrast filled large bowel is noted with filling of the appendix. Dilated small bowel loops are present within the abdomen and pelvis. Findings are in keeping with an ileus given persistent oral contrast within large bowel. IMPRESSION: Gastric tube in the proximal stomach. Bowel gas pattern suggests an ileus. Electronically Signed   By: Ashley Royalty M.D.   On: 08/21/2016 03:34   Dg Abd Portable 1v  Result Date: 08/20/2016 CLINICAL DATA:  Nausea and abdominal pain EXAM: PORTABLE ABDOMEN - 1 VIEW COMPARISON:  Abdominal radiograph 08/19/2016 FINDINGS: There is contrast material within the ascending colon and appendix. There is a large amount of stool  throughout the remainder of the colon. Gas-filled loops of small bowel are again seen. IMPRESSION: Large amount of stool in the colon with contrast material opacifying the ascending colon. Multiple gas-filled loops of small bowel. Findings are again suggestive of adynamic ileus. Electronically Signed   By: Ulyses Jarred M.D.   On: 08/20/2016 20:18   Dg Abd  Portable 1v  Result Date: 08/19/2016 CLINICAL DATA:  Adynamic ileus EXAM: PORTABLE ABDOMEN - 1 VIEW COMPARISON:  CT scan August 13, 2016 FINDINGS: Contrast is seen in the cecum, proximal ascending colon, and stomach. Air-filled mildly prominent loops of large and small bowel are seen, possibly representing a mild ileus. No definitive obstruction. IMPRESSION: Probable mild ileus. Electronically Signed   By: Dorise Bullion III M.D   On: 08/19/2016 16:40   Dg Swallowing Func-speech Pathology  Result Date: 08/19/2016 Carterm5 Objective Swallowing Evaluation: Type of Study: MBS-Modified Barium Swallow Study Patient Details Name: Braun Rocca MRN: [id number] Date of Birth: 07-Aug-1927 Today's Date: 08/19/2016 Time: SLP Start Time (ACUTE ONLY): 1040-SLP Stop Time (ACUTE ONLY): 1104 SLP Time Calculation (min) (ACUTE ONLY): 24 min Past Medical History: Past Medical History: Diagnosis Date . At risk for sleep apnea   STOP-BANG= 4    SENT TO PCP 04-04-2014 . BPH (benign prostatic hypertrophy)  . Depression  . Diverticulosis of colon  . GERD (gastroesophageal reflux disease)  . Gilbert's syndrome  . Headache   migraines . Hearing loss  . Heart murmur   "Calcified aortic valve" . Hepatitis   Hep A as a child . History of gastroenteritis  . History of kidney stones  . History of syncope   2002--  vasovagal . HOH (hard of hearing)   refuses to wear his Hearing Aid . Hypertension  . Inguinal hernia   right . Internal hemorrhoid, bleeding  . Moderate aortic stenosis   a. Last echo 11/2013, 06/2014. Marland Kitchen Nocturia  . Normal coronary arteries   a. Cath 07/2010: normal coronaries. Felt to have noncardiac CP/SOB at that time possibly r/t anxiety. . RA (rheumatoid arthritis) ([place])   bilateral hands/ wrist--  seronegative . Rotator cuff syndrome of right shoulder 08/01/2013 . Shortness of breath dyspnea   with exertion . Stone, kidney  . Thyroid cyst  . Thyroid nodule   noted 11-2009 . Wears dentures  Past Surgical History: Past Surgical History:  Procedure Laterality Date . CARDIAC CATHETERIZATION  07-19-2010  dr Tressia Miners turner  normal coronary arteries,  ef 60%,  moderate aortic stenosis- gradient 11mmHg, normal right heart pressure . CARDIOVASCULAR STRESS TEST  05/ 2011   dr Marlou Porch  normal low risk perfusion study . CATARACT EXTRACTION W/ INTRAOCULAR LENS  IMPLANT, BILATERAL  2013 . COLONOSCOPY  2010  approx . THYROID LOBECTOMY Right 05/05/2015  Procedure: RIGHT THYROID LOBECTOMY;  Surgeon: Armandina Gemma, MD;  Location: [facility];  Service: General;  Laterality: Right; . TRANSANAL HEMORRHOIDAL DEARTERIALIZATION N/A 04/09/2014  Procedure: TRANSANAL HEMORRHOIDAL DEARTERIALIZATION OF INTERNAL HEMORROIDS;  Surgeon: Leighton Ruff, MD;  Location: [facility];  Service: General;  Laterality: N/A; . TRANSTHORACIC ECHOCARDIOGRAM  11-26-2013  moderate focal basal and mild LVH/  ef 54-27%/  grade I diastolic dysfunction/ mild LAE/  moderate calcification with stenosis AV with mild regurg,  gradients 35 abd 58 mmHg /  mild TR HPI: 88yom s/p fall from tree. Through his daughter he climbed about 25 feet then lost his stand. While trying to climb down he fell from about 3 meters. Sustained right rib fx, pneumothorax, costochondral fx, small  anterior med hematoma. Ct chest on arrival shows There is a right-sided pneumothorax, estimated to be 5% of lung volume, seen primarily along the medial aspect of the lung. Right chest wall subcutaneous gas is present primarily along the anterior aspect of the chest. There is a small right pleural effusion. Minimal subsegmental atelectasis identified at the right lung base. Within the right lower lobe a pulmonary nodule measures 6 mm. Family reproted to MD that pt does not drink much due to fear of choking, frequent choking. UGI on 2007 showed dysmotility, hypertrophied cricopharyngeus muscle.  No Data Recorded Assessment / Plan / Recommendation CHL IP CLINICAL IMPRESSIONS 08/19/2016 Clinical Impression Mr. Lozinski presents  with a suspected esophageal dysphagia (no penetration/aspiration during this study). Oral phase impairments included premature spillage with thin liquids via straw. Initial swallow of thin consecutive cup sips resulted in sudden coughing, although no penetrates/aspirates on fluoro. Vallecular and pyriform sinus residue throughout the exam, however pt was able to clear residuals with reflexive second swallows. Mastication of solids was timely with no oral impairments and mild vallecular residue. The barium pill became briefly lodged at the level of the CP segment but then cleared into the esophagus, although the pill remained stagnant in the esophagus and noted backflow during esophageal scan (MBS does not diagnose below the level of the UES). Educated pt and daughter re: results of assessment and esophageal precautions (remain seated upright after meals, follow solids with liquids, possible GI consult). No further ST needed.  SLP Visit Diagnosis Dysphagia, pharyngoesophageal phase (R13.14) Attention and concentration deficit following -- Frontal lobe and executive function deficit following -- Impact on safety and function Mild aspiration risk   CHL IP TREATMENT RECOMMENDATION 08/19/2016 Treatment Recommendations No treatment recommended at this time   Prognosis 08/15/2016 Prognosis for Safe Diet Advancement Good Barriers to Reach Goals -- Barriers/Prognosis Comment -- CHL IP DIET RECOMMENDATION 08/19/2016 SLP Diet Recommendations Regular solids;Thin liquid Liquid Administration via Cup;Straw Medication Administration Whole meds with liquid Compensations Slow rate;Small sips/bites Postural Changes Remain semi-upright after after feeds/meals (Comment);Seated upright at 90 degrees   CHL IP OTHER RECOMMENDATIONS 08/19/2016 Recommended Consults Consider GI evaluation Oral Care Recommendations Oral care BID Other Recommendations --   CHL IP FOLLOW UP RECOMMENDATIONS 08/19/2016 Follow up Recommendations None   CHL IP FREQUENCY AND  DURATION 08/15/2016 Speech Therapy Frequency (ACUTE ONLY) min 2x/week Treatment Duration 2 weeks      CHL IP ORAL PHASE 08/19/2016 Oral Phase Impaired Oral - Pudding Teaspoon -- Oral - Pudding Cup -- Oral - Honey Teaspoon -- Oral - Honey Cup -- Oral - Nectar Teaspoon -- Oral - Nectar Cup -- Oral - Nectar Straw -- Oral - Thin Teaspoon -- Oral - Thin Cup WFL Oral - Thin Straw Premature spillage Oral - Puree -- Oral - Mech Soft -- Oral - Regular WFL Oral - Multi-Consistency -- Oral - Pill WFL Oral Phase - Comment --  CHL IP PHARYNGEAL PHASE 08/19/2016 Pharyngeal Phase Impaired Pharyngeal- Pudding Teaspoon -- Pharyngeal -- Pharyngeal- Pudding Cup -- Pharyngeal -- Pharyngeal- Honey Teaspoon -- Pharyngeal -- Pharyngeal- Honey Cup -- Pharyngeal -- Pharyngeal- Nectar Teaspoon -- Pharyngeal -- Pharyngeal- Nectar Cup -- Pharyngeal -- Pharyngeal- Nectar Straw -- Pharyngeal -- Pharyngeal- Thin Teaspoon -- Pharyngeal -- Pharyngeal- Thin Cup Pharyngeal residue - pyriform;Pharyngeal residue - valleculae;Reduced tongue base retraction;Reduced laryngeal elevation Pharyngeal -- Pharyngeal- Thin Straw Pharyngeal residue - pyriform;Reduced laryngeal elevation Pharyngeal -- Pharyngeal- Puree -- Pharyngeal -- Pharyngeal- Mechanical Soft -- Pharyngeal -- Pharyngeal- Regular Pharyngeal residue - valleculae;Reduced  tongue base retraction Pharyngeal -- Pharyngeal- Multi-consistency -- Pharyngeal -- Pharyngeal- Pill WFL Pharyngeal -- Pharyngeal Comment --  CHL IP CERVICAL ESOPHAGEAL PHASE 08/19/2016 Cervical Esophageal Phase Impaired Pudding Teaspoon -- Pudding Cup -- Honey Teaspoon -- Honey Cup -- Nectar Teaspoon -- Nectar Cup -- Nectar Straw -- Thin Teaspoon -- Thin Cup -- Thin Straw -- Puree -- Mechanical Soft -- Regular -- Multi-consistency -- Pill -- Cervical Esophageal Comment -- No flowsheet data found. [place] Siren 08/19/2016, 1:31 PM Orbie Pyo Colvin Caroli.Ed CCC-SLP Pager [phone]               Anti-infectives: Anti-infectives     None      Assessment/Plan: Patient Active Problem List   Diagnosis Date Noted  . Ileus ([place]) 08/20/2016  . Dysphagia 08/20/2016  . Chest pain 08/20/2016  . Pneumothorax 08/13/2016  . 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 Aortic stenosis 06/20/2014  . Chest tightness 06/20/2014  . Essential hypertension 06/20/2014  . Insomnia 06/20/2014  . Sinus bradycardia 06/20/2014  . Rectal bleeding 01/20/2014  . Aortic valve disorders 11/07/2013  . Rotator cuff syndrome of right shoulder 08/01/2013  ARF   Bump in Cr probably related to volume contractton  Continue hydration today   Leave NGT out for now   Pt has had a drop in his HGB overnight Anemia may explain why he has a hard time ambulating but was 9.7 last night  May need blood products  Will order CT scan today to evaluate for intraabdominal source CXR does not show hemothorax   Had a small mediastinal hematoma at admission but unlikely to drop H/H  Overall he looks and feels better compared to last night  He has had no hemodynamic instability and no GI bleeding    LOS: 8 days    Mohini Heathcock A. 08/21/2016

## 2016-08-21 NOTE — Progress Notes (Signed)
[facility] Progress Note  Subjective: Patient developed increasing discomfort last night, and NG tube placed for some period of time which she later pulled out. It was decided to leave the tube out. He states he feels better and less distended and nauseated today. Still has not passed anything significant per rectum. An abdominal and pelvic CT scan has been ordered and has, According to the daughter a Dulcolax suppository Objective: Vital signs in last 24 hours: Temp:  [97.2 F (36.2 C)-98.4 F (36.9 C)] 97.5 F (36.4 C) (03/11 1300) Pulse Rate:  [69-96] 76 (03/11 1300) Resp:  [15-20] 16 (03/11 1300) BP: (91-131)/(44-86) 100/86 (03/11 1300) SpO2:  [95 %-100 %] 99 % (03/11 1300) Weight:  [79 kg (174 lb 2.6 oz)] 79 kg (174 lb 2.6 oz) (03/11 0351) Weight change:    PE: Abdomen still somewhat distended patient looks more alert and comfortable.  Lab Results: Results for orders placed or performed during the hospital encounter of 08/13/16 (from the past 24 hour(s))  Troponin I (q 6hr x 3)     Status: Abnormal   Collection Time: 08/20/16  2:58 PM  Result Value Ref Range   Troponin I 0.13 (HH) <0.03 ng/mL  Troponin I (q 6hr x 3)     Status: Abnormal   Collection Time: 08/20/16  7:05 PM  Result Value Ref Range   Troponin I 0.14 (HH) <0.03 ng/mL  Troponin I (q 6hr x 3)     Status: Abnormal   Collection Time: 08/21/16  3:13 AM  Result Value Ref Range   Troponin I 0.10 (HH) <0.03 ng/mL  CBC     Status: Abnormal   Collection Time: 08/21/16  3:13 AM  Result Value Ref Range   WBC 6.8 4.0 - 10.5 K/uL   RBC 2.45 (L) 4.22 - 5.81 MIL/uL   Hemoglobin 7.2 (L) 13.0 - 17.0 g/dL   HCT 22.3 (L) 39.0 - 52.0 %   MCV 91.0 78.0 - 100.0 fL   MCH 29.4 26.0 - 34.0 pg   MCHC 32.3 30.0 - 36.0 g/dL   RDW 13.6 11.5 - 15.5 %   Platelets 234 150 - 400 K/uL  Comprehensive metabolic panel     Status: Abnormal   Collection Time: 08/21/16  3:13 AM  Result Value Ref Range   Sodium 137 135 - 145  mmol/L   Potassium 4.8 3.5 - 5.1 mmol/L   Chloride 106 101 - 111 mmol/L   CO2 25 22 - 32 mmol/L   Glucose, Bld 162 (H) 65 - 99 mg/dL   BUN 27 (H) 6 - 20 mg/dL   Creatinine, Ser 1.87 (H) 0.61 - 1.24 mg/dL   Calcium 8.1 (L) 8.9 - 10.3 mg/dL   Total Protein 5.3 (L) 6.5 - 8.1 g/dL   Albumin 2.7 (L) 3.5 - 5.0 g/dL   AST 39 15 - 41 U/L   ALT 34 17 - 63 U/L   Alkaline Phosphatase 50 38 - 126 U/L   Total Bilirubin 1.4 (H) 0.3 - 1.2 mg/dL   GFR calc non Af Amer 31 (L) >60 mL/min   GFR calc Af Amer 36 (L) >60 mL/min   Anion gap 6 5 - 15  Troponin I     Status: Abnormal   Collection Time: 08/21/16  5:11 AM  Result Value Ref Range   Troponin I 0.10 (HH) <0.03 ng/mL  Hemoglobin and hematocrit, blood     Status: Abnormal   Collection Time: 08/21/16  5:11 AM  Result Value Ref  Range   Hemoglobin 7.1 (L) 13.0 - 17.0 g/dL   HCT 21.9 (L) 39.0 - 52.0 %  Magnesium     Status: None   Collection Time: 08/21/16  8:44 AM  Result Value Ref Range   Magnesium 2.1 1.7 - 2.4 mg/dL  Prepare RBC     Status: None   Collection Time: 08/21/16 10:03 AM  Result Value Ref Range   Order Confirmation ORDER PROCESSED BY BLOOD BANK   Type and screen [place]     Status: None (Preliminary result)   Collection Time: 08/21/16 10:15 AM  Result Value Ref Range   ABO/RH(D) B POS    Antibody Screen NEG    Sample Expiration 08/24/2016    Unit Number [id number]    Blood Component Type RED CELLS,LR    Unit division 00    Status of Unit ALLOCATED    Transfusion Status OK TO TRANSFUSE    Crossmatch Result Compatible    Unit Number [id number]    Blood Component Type RED CELLS,LR    Unit division 00    Status of Unit ISSUED    Transfusion Status OK TO TRANSFUSE    Crossmatch Result Compatible   ABO/Rh     Status: None (Preliminary result)   Collection Time: 08/21/16 10:15 AM  Result Value Ref Range   ABO/RH(D) B POS     Studies/Results: Dg Chest 2 View  Result Date: 08/20/2016 CLINICAL  DATA:  Chest pain and diaphoresis. History of hypertension, aortic stenosis, ileus. Former smoker. EXAM: CHEST  2 VIEW COMPARISON:  Chest x-rays dated 08/15/2016 and 04/29/2015. Comparison also made to chest CT of 08/13/2016. FINDINGS: Study is hypoinspiratory with crowding of the perihilar and bibasilar bronchovascular markings. Suspect at least mild edema and/or atelectasis within the right lower lung, versus asymmetrically prominent hypoinspiratory changes. Right pleural effusion, small to moderate in size. Heart size and mediastinal contours are stable. Again noted is subcutaneous emphysema overlying the right chest wall, similar to previous exams. No pneumothorax seen. IMPRESSION: 1. Low lung volumes. Right pleural effusion, small to moderate in size. Suspect additional edema and/or atelectasis at the right lung base. 2. Subcutaneous emphysema overlying the right chest wall, similar to previous exams. 3. No pneumothorax seen, possibly resolved since earlier exams, possibly obscured by the overlying subcutaneous emphysema. Electronically Signed   By: Franki Cabot M.D.   On: 08/20/2016 11:59   Dg Abd 1 View  Result Date: 08/21/2016 CLINICAL DATA:  Ileus, NG tube placement EXAM: ABDOMEN - 1 VIEW COMPARISON:  08/20/2016 FINDINGS: A gastric tube tip and side-port are seen in the proximal body of the stomach. Contrast filled large bowel is noted with filling of the appendix. Dilated small bowel loops are present within the abdomen and pelvis. Findings are in keeping with an ileus given persistent oral contrast within large bowel. IMPRESSION: Gastric tube in the proximal stomach. Bowel gas pattern suggests an ileus. Electronically Signed   By: Ashley Royalty M.D.   On: 08/21/2016 03:34   Dg Abd 2 Views  Result Date: 08/21/2016 CLINICAL DATA:  Ileus EXAM: ABDOMEN - 2 VIEW COMPARISON:  08/21/2016 FINDINGS: Contrast material remains throughout the colon similar to that seen on the prior exam from a prior CT study.  The nasogastric catheter has been removed in the interval. Right-sided pleural effusion is again identified. No obstructive changes are seen. No free air is noted. Degenerative changes of the lumbar spine are seen. IMPRESSION: No acute abnormality is noted aside from a  right pleural effusion. Stable contrast throughout the colon. Electronically Signed   By: Inez Catalina M.D.   On: 08/21/2016 08:46   Dg Abd Portable 1v  Result Date: 08/20/2016 CLINICAL DATA:  Nausea and abdominal pain EXAM: PORTABLE ABDOMEN - 1 VIEW COMPARISON:  Abdominal radiograph 08/19/2016 FINDINGS: There is contrast material within the ascending colon and appendix. There is a large amount of stool throughout the remainder of the colon. Gas-filled loops of small bowel are again seen. IMPRESSION: Large amount of stool in the colon with contrast material opacifying the ascending colon. Multiple gas-filled loops of small bowel. Findings are again suggestive of adynamic ileus. Electronically Signed   By: Ulyses Jarred M.D.   On: 08/20/2016 20:18   Dg Abd Portable 1v  Result Date: 08/19/2016 CLINICAL DATA:  Adynamic ileus EXAM: PORTABLE ABDOMEN - 1 VIEW COMPARISON:  CT scan August 13, 2016 FINDINGS: Contrast is seen in the cecum, proximal ascending colon, and stomach. Air-filled mildly prominent loops of large and small bowel are seen, possibly representing a mild ileus. No definitive obstruction. IMPRESSION: Probable mild ileus. Electronically Signed   By: Dorise Bullion III M.D   On: 08/19/2016 16:40      Assessment: 1. Likely trauma related ileus, KUB essentially unchanged from yesterday. 2. Drop in hemoglobin  Plan: 1. Await previously ordered CT scan 2. Suppository ordered 3. If patient significantly uncomfortable and predominantly colonic dilatation, a flexes seal rectal tube could be placed. 4. Electrolytes have been normal and pain medicines have been discontinued. 5. Monitor stools and hemoglobin for potential  bleeding.    Kemari Mares C 08/21/2016, 1:38 PM  Pager [phone] If no answer or after 5 PM call [phone]

## 2016-08-21 NOTE — Progress Notes (Signed)
NGT and Nasal Cannula found on the floor. Per daughter, Pt stated the reason he pulled it out was due to pain. 50 ml noted on canister. Dr. Brantley Stage notified, stated to not replaced NGT.

## 2016-08-22 DIAGNOSIS — N179 Acute kidney failure, unspecified: Secondary | ICD-10-CM

## 2016-08-22 DIAGNOSIS — D649 Anemia, unspecified: Secondary | ICD-10-CM

## 2016-08-22 LAB — BASIC METABOLIC PANEL
Anion gap: 4 — ABNORMAL LOW (ref 5–15)
BUN: 19 mg/dL (ref 6–20)
CALCIUM: 7.6 mg/dL — AB (ref 8.9–10.3)
CO2: 23 mmol/L (ref 22–32)
CREATININE: 1.15 mg/dL (ref 0.61–1.24)
Chloride: 111 mmol/L (ref 101–111)
GFR, EST NON AFRICAN AMERICAN: 56 mL/min — AB (ref >60)
Glucose, Bld: 123 mg/dL — ABNORMAL HIGH (ref 65–99)
Potassium: 4.2 mmol/L (ref 3.5–5.1)
SODIUM: 138 mmol/L (ref 135–145)

## 2016-08-22 LAB — BPAM RBC
BLOOD PRODUCT EXPIRATION DATE: 201803172359
BLOOD PRODUCT EXPIRATION DATE: 201804062359
ISSUE DATE / TIME: 201803111234
ISSUE DATE / TIME: 201803111843
UNIT TYPE AND RH: 7300
Unit Type and Rh: 7300

## 2016-08-22 LAB — TYPE AND SCREEN
ABO/RH(D): B POS
ANTIBODY SCREEN: NEGATIVE
UNIT DIVISION: 0
UNIT DIVISION: 0

## 2016-08-22 LAB — CBC
HCT: 25.8 % — ABNORMAL LOW (ref 39.0–52.0)
HCT: 31.3 % — ABNORMAL LOW (ref 39.0–52.0)
Hemoglobin: 8.4 g/dL — ABNORMAL LOW (ref 13.0–17.0)
Hemoglobin: 9.9 g/dL — ABNORMAL LOW (ref 13.0–17.0)
MCH: 28.8 pg (ref 26.0–34.0)
MCH: 28.3 pg (ref 26.0–34.0)
MCHC: 32.6 g/dL (ref 30.0–36.0)
MCHC: 31.6 g/dL (ref 30.0–36.0)
MCV: 88.4 fL (ref 78.0–100.0)
MCV: 89.4 fL (ref 78.0–100.0)
PLATELETS: 191 10*3/uL (ref 150–400)
Platelets: 179 10*3/uL (ref 150–400)
RBC: 2.92 MIL/uL — ABNORMAL LOW (ref 4.22–5.81)
RBC: 3.5 MIL/uL — AB (ref 4.22–5.81)
RDW: 15.5 % (ref 11.5–15.5)
RDW: 15.8 % — ABNORMAL HIGH (ref 11.5–15.5)
WBC: 4.3 10*3/uL (ref 4.0–10.5)
WBC: 4.4 10*3/uL (ref 4.0–10.5)

## 2016-08-22 LAB — OCCULT BLOOD X 1 CARD TO LAB, STOOL: Fecal Occult Bld: NEGATIVE

## 2016-08-22 LAB — HEMOGLOBIN AND HEMATOCRIT, BLOOD
HEMATOCRIT: 28.8 % — AB (ref 39.0–52.0)
HEMOGLOBIN: 9.6 g/dL — AB (ref 13.0–17.0)

## 2016-08-22 NOTE — Progress Notes (Signed)
Pt pulled 2 IVs out. Pulled out tele leads off. Does not want them replaced or have his VSs taken or be touched. He called his daughters to come over.I talked to one of the daughters, and she stated they are on their way. When I asked pt what is wrong, he stated  I don't know, I am dead. On call Trauma MD paged.

## 2016-08-22 NOTE — Care Management Important Message (Signed)
Important Message  Patient Details  Name: [name] MRN: [id number] Date of Birth: 1928-04-25   Medicare Important Message Given:  Yes    [name] Abena 08/22/2016, 1:16 PM

## 2016-08-22 NOTE — Progress Notes (Signed)
Received a call from nurses station that pt is on the floor. Upon arrival, three nurses were already in the room. Per Rosealee Albee, they heard bed alarm ringing, but did not ring at nurses station. Upon their arrival, pt was standing beside bed and gradually kneeled himself next to the bed. Pt was assisted back in bed. Pt stated he is feeling ok and was able to state his name and location. BP 107/52 ; P 73. Charge nurse aware and agreed it was not a fall.  Off note, less than five minutes before receiving call, I was in the room to replace tele electrodes on pt. He was awake in bed and asked what the cuff hanging on the bedside rail was for. I replied to check your blood pressure. He stated Ok.

## 2016-08-22 NOTE — Plan of Care (Signed)
Problem: Pain Managment: Goal: General experience of comfort will improve Outcome: Progressing Patient had complaints of generalized pain this am, we gave him his scheduled pain medicine and repositioned him

## 2016-08-22 NOTE — Progress Notes (Signed)
[facility] Gastroenterology Progress Note  Aly Hauser 83 y.o. 01/09/1928  CC:  Ileus   Subjective: Patient had 2 bowel movements so far. Feeling somewhat better. Continues to complain of nausea and vomiting with decreased oral intake. Family at bedside.  ROS : Was 82 for nausea and vomiting.   Objective: Vital signs in last 24 hours: Vitals:   08/22/16 0525 08/22/16 1100  BP: (!) 112/54 (!) 105/52  Pulse: 64 71  Resp: (!) 21 (!) 21  Temp: 98.3 F (36.8 C) 98.2 F (36.8 C)    Physical Exam:  General:  Alert, cooperative, no distress, appears stated age  Head:  Normocephalic, without obvious abnormality, atraumatic  Eyes:  , EOM's intact,   Lungs:   Bilateral wheezing   Heart:  Regular rate and rhythm, S1, S2 normal  Abdomen:   Distended, non-tender, bowel sounds active all four quadrants,  no masses,   Extremities: Extremities normal, atraumatic, no  edema  Pulses: 2+ and symmetric    Lab Results:  Recent Labs  08/21/16 0313 08/21/16 0844 08/22/16 0322  NA 137  --  138  K 4.8  --  4.2  CL 106  --  111  CO2 25  --  23  GLUCOSE 162*  --  123*  BUN 27*  --  19  CREATININE 1.87*  --  1.15  CALCIUM 8.1*  --  7.6*  MG  --  2.1  --     Recent Labs  08/20/16 1026 08/21/16 0313  AST 42* 39  ALT 39 34  ALKPHOS 54 50  BILITOT 2.1* 1.4*  PROT 5.9* 5.3*  ALBUMIN 3.0* 2.7*    Recent Labs  08/21/16 0313  08/21/16 2339 08/22/16 0322  WBC 6.8  --   --  4.3  HGB 7.2*  < > 9.6* 8.4*  HCT 22.3*  < > 28.8* 25.8*  MCV 91.0  --   --  88.4  PLT 234  --   --  179  < > = values in this interval not displayed. No results for input(s): LABPROT, INR in the last 72 hours.    Assessment/Plan: - Ileus. Patient had 2 bowel movement so far. Feeling better. - Nausea and vomiting. - drop in hemoglobin. Occult blood negative. No overt bleeding. Most likely internal bleeding/pneumoperitoneum - Pneumoperitoneum.  Recommendations -------------------------- - CT scan showed  normal-appearing stomach, small bowel as well as colon and pneumoperitoneum - Occult blood negative.- Continue current management. - GI will sign off. Call us back if needed. Thank you for consultation   Otis Brace MD, [place] 08/22/2016, 12:45 PM  Pager [phone]  If no answer or after 5 PM call [phone]

## 2016-08-22 NOTE — Progress Notes (Signed)
Family in room. Pt has now calmed down, chatting with his family. Agreed for VSs be taken, placed back on 2L [place], and tele leads reapplied. Does not agree to restart Iv. Requested to see the doctor. On-call Trauma MD paged at [phone]. [phone], and [phone], no call back.

## 2016-08-22 NOTE — Progress Notes (Signed)
PT has evaluated pt., thinks he can get to a modified independent level  and is recommending IP Rehab.  OT has recommended SNF. Patient was screened by Gerlean Ren for appropriateness for an Inpatient Acute Rehab consult.  At this time, we are recommending Inpatient Rehab consult.  I have notified Ellan Lambert, RNCM.  Please call if questions.  [place] Admissions Coordinator Cell [phone] Office [phone]    s

## 2016-08-22 NOTE — Progress Notes (Signed)
Physical Therapy Treatment Patient Details Name: [name] MRN: [id number] DOB: 05/20/1928 Today's Date: 08/22/2016    History of Present Illness Pt adm after 25 foot fall from a tree and suffered rt rib fx's and small rt pneumothorax. PMH - HTN, RA, rt rotator cuff problems, Hep A, and Depression.    PT Comments    Pt making steady progress. Family member returning to home out of town and pt will not have 24 hour assist at dc. Family would like pt to go to CIR. Pt could benefit from CIR and expect he could reach modified independent level with CIR.   Follow Up Recommendations  CIR (pt will be returning home alone)     Equipment Recommendations  Rolling walker with 5" wheels    Recommendations for Other Services       Precautions / Restrictions Precautions Precautions: Fall Precaution Comments: Pt accustomed to being independent so needs chair alarm when up.  Not safe to get up alone Restrictions Weight Bearing Restrictions: No    Mobility  Bed Mobility Overal bed mobility: Needs Assistance Bed Mobility: Supine to Sit     Supine to sit: Min assist Sit to supine: Min assist   General bed mobility comments: Assist to elevate trunk into sitting and bring hips to EOB  Transfers Overall transfer level: Needs assistance Equipment used: Rolling walker (2 wheeled) Transfers: Sit to/from Stand Sit to Stand: Min guard         General transfer comment: Assist for balance and safety  Ambulation/Gait Ambulation/Gait assistance: Min guard Ambulation Distance (Feet): 170 Feet Assistive device: Rolling walker (2 wheeled) Gait Pattern/deviations: Step-through pattern;Decreased stride length;Drifts right/left Gait velocity: decr   General Gait Details: Assist for balance and safety. Unable to get SpO2 reading during or after amb. SpO2 prior to amb 98% on RA. Amb on RA.    Stairs            Wheelchair Mobility    Modified Rankin (Stroke Patients Only)       Balance Overall balance assessment: Needs assistance Sitting-balance support: No upper extremity supported;Feet supported Sitting balance-Leahy Scale: Good     Standing balance support: During functional activity;Single extremity supported Standing balance-Leahy Scale: Poor Standing balance comment: UE assist for standing.                    Cognition Arousal/Alertness: Awake/alert Behavior During Therapy: WFL for tasks assessed/performed Overall Cognitive Status: Within Functional Limits for tasks assessed                      Exercises      General Comments General comments (skin integrity, edema, etc.): Cues for hand placement each time he transferred.      Pertinent Vitals/Pain Pain Assessment: Faces Faces Pain Scale: Hurts even more Pain Location: ribs Pain Descriptors / Indicators: Sore;Grimacing;Guarding Pain Intervention(s): Limited activity within patient's tolerance    Home Living                      Prior Function            PT Goals (current goals can now be found in the care plan section) Acute Rehab PT Goals Patient Stated Goal: to return home Progress towards PT goals: Progressing toward goals    Frequency    Min 3X/week      PT Plan Discharge plan needs to be updated    Co-evaluation  End of Session Equipment Utilized During Treatment: Gait belt Activity Tolerance: Patient tolerated treatment well Patient left: in chair;with call bell/phone within reach;with family/visitor present   PT Visit Diagnosis: Unsteadiness on feet (R26.81) Pain - Right/Left: Right Pain - part of body:  (ribs)     Time: 5110-2111 PT Time Calculation (min) (ACUTE ONLY): 19 min  Charges:  $Gait Training: 8-22 mins                    G CodesShary Decamp [place] 31-Aug-2016, 1:50 PM [employer] PT [phone]

## 2016-08-22 NOTE — Progress Notes (Signed)
Pt requested to call his daughters again, stated he has 1 to 3 hours left with his life. Handed him his phone to place the call.

## 2016-08-22 NOTE — Progress Notes (Addendum)
PROGRESS NOTE    [name]  LKG:[id number] DOB: [birth year] DOA: 08/13/2016 PCP: [name], MD   Chief Complaint  Patient presents with  . Trauma     Brief Narrative:  Consulted for medical management.   Assessment & Plan   Anemia -?Blood loss, internal bleeding -CT abd/pelvis pending -FOBT pending -Anemia panel pending -Hemoglobin on admission 13, dropped to 7 -Transfused 2u PRBCs 3/11 -Hemoglobin currently 8.4 -CT abd/pelvis showed hemoperitoneum (Spoke with Dr. Grandville Silos, patient possibly has a slow bleed, however, since his abd exam has improved, will continue to monitor CBC closely) -GI and surg following   Acute kidney injury -likely secondary to anemia and poor oral intake -Creatinine peaked to 1.87, currently 1.15 -Continue to monitor BMP  Esophageal dysphagia and ileus -defer to primary team and gastroenterology -CT abd/pelvis address above -Continue bowel regimen -Had NG tube, but pulled it out -Denies nausea/vomiting -Feels abd pain is improving- currently having a bowel movement -discontinued regular diet and placed on liquid diet  Right pneumothorax -5% of lung volume, present on admission -was conservative treated. -Continue nebs and incentive spirometry, pulm toilet  -Noted on CT abd/pelvis, pneumomediastinum  Elevated troponin/Transient diaphoresis today, w/ diffuse pains -felt multifactorial, including pain, ileus, anemia, demand ischemia -Troponin mildly elevated, but flat -Echocardiogram showed EF 66-44%, grade 1 diastolic dysfunction  Hypertension -Felt may be exacerbated by pain/ileus as well. -continue HCTZ and irbesartan   Hyperlipidemina -continue asa and statin  GERD -Continue PPI  Depression -Continue On lexapro  DVT Prophylaxis  Discontinued lovenox given anemia, SCDs  Code Status: Full  Family Communication: Family at bedside, and on the phone. Updated  Disposition Plan: Admitted, per  primary  Consultants [facility] Cardiology Gastroenterology   Procedures    Antibiotics   Anti-infectives    None      Subjective:   [name] seen and examined today. Feeling better today.  Currently having a bowel movement. Denies nausea or vomiting or chest pain. Feels breathing has improved mildly.   Objective:   Vitals:   08/21/16 2045 08/21/16 2145 08/22/16 0041 08/22/16 0525  BP: (!) 141/58 (!) 121/55 (!) 107/52 (!) 112/54  Pulse: 69 67 69 64  Resp: 20 (!) 22 (!) 26 (!) 21  Temp: 97.6 F (36.4 C) 98.4 F (36.9 C) 98 F (36.7 C) 98.3 F (36.8 C)  TempSrc: Oral Oral Oral Oral  SpO2: 100% 100% 99% 99%  Weight:    84.1 kg (185 lb 6.5 oz)  Height:        Intake/Output Summary (Last 24 hours) at 08/22/16 1111 Last data filed at 08/22/16 0600  Gross per 24 hour  Intake             2951 ml  Output              425 ml  Net             2526 ml   Filed Weights   08/13/16 1606 08/21/16 0351 08/22/16 0525  Weight: 77.1 kg (170 lb) 79 kg (174 lb 2.6 oz) 84.1 kg (185 lb 6.5 oz)    Exam  General: Well developed, well nourished, NAD, appears stated age  HEENT: NCAT, mucous membranes moist.   Cardiovascular: S1 S2 auscultated,RRR, no murmurs  MSK chest wall TTP   Respiratory: Clear to auscultation bilaterally with equal chest rise  Abdomen: Soft, nontender, distended, + bowel sounds  Extremities: warm dry without cyanosis clubbing or edema  Neuro: AAOx3, nonfocal  Psych: Normal affect  and demeanor   Data Reviewed: I have personally reviewed following labs and imaging studies  CBC:  Recent Labs Lab 08/20/16 1026 08/21/16 0313 08/21/16 0511 08/21/16 2339 08/22/16 0322  WBC 8.9 6.8  --   --  4.3  HGB 9.7* 7.2* 7.1* 9.6* 8.4*  HCT 30.3* 22.3* 21.9* 28.8* 25.8*  MCV 91.0 91.0  --   --  88.4  PLT 242 234  --   --  268   Basic Metabolic Panel:  Recent Labs Lab 08/17/16 0218 08/18/16 0342 08/20/16 0513 08/20/16 1026 08/21/16 0313  08/21/16 0844 08/22/16 0322  NA 137 140  --  137 137  --  138  K 4.4 4.6  --  4.2 4.8  --  4.2  CL 105 107  --  104 106  --  111  CO2 26 27  --  23 25  --  23  GLUCOSE 110* 98  --  142* 162*  --  123*  BUN 21* 20  --  13 27*  --  19  CREATININE 1.19 1.03 1.00 1.06 1.87*  --  1.15  CALCIUM 8.2* 8.4*  --  8.3* 8.1*  --  7.6*  MG  --   --   --   --   --  2.1  --    GFR: Estimated Creatinine Clearance: 46.1 mL/min (by C-G formula based on SCr of 1.15 mg/dL). Liver Function Tests:  Recent Labs Lab 08/20/16 1026 08/21/16 0313  AST 42* 39  ALT 39 34  ALKPHOS 54 50  BILITOT 2.1* 1.4*  PROT 5.9* 5.3*  ALBUMIN 3.0* 2.7*    Recent Labs Lab 08/20/16 1026  LIPASE 14   No results for input(s): AMMONIA in the last 168 hours. Coagulation Profile: No results for input(s): INR, PROTIME in the last 168 hours. Cardiac Enzymes:  Recent Labs Lab 08/20/16 1026 08/20/16 1458 08/20/16 1905 08/21/16 0313 08/21/16 0511  TROPONINI 0.05* 0.13* 0.14* 0.10* 0.10*   BNP (last 3 results) No results for input(s): PROBNP in the last 8760 hours. HbA1C: No results for input(s): HGBA1C in the last 72 hours. CBG: No results for input(s): GLUCAP in the last 168 hours. Lipid Profile: No results for input(s): CHOL, HDL, LDLCALC, TRIG, CHOLHDL, LDLDIRECT in the last 72 hours. Thyroid Function Tests: No results for input(s): TSH, T4TOTAL, FREET4, T3FREE, THYROIDAB in the last 72 hours. Anemia Panel: No results for input(s): VITAMINB12, FOLATE, FERRITIN, TIBC, IRON, RETICCTPCT in the last 72 hours. Urine analysis:    Component Value Date/Time   COLORURINE YELLOW 08/13/2016 1820   APPEARANCEUR CLEAR 08/13/2016 1820   LABSPEC 1.038 (H) 08/13/2016 1820   PHURINE 5.0 08/13/2016 1820   GLUCOSEU NEGATIVE 08/13/2016 1820   HGBUR LARGE (A) 08/13/2016 1820   BILIRUBINUR NEGATIVE 08/13/2016 1820   KETONESUR 20 (A) 08/13/2016 1820   PROTEINUR NEGATIVE 08/13/2016 1820   UROBILINOGEN 1.0 04/14/2014  0116   NITRITE NEGATIVE 08/13/2016 1820   LEUKOCYTESUR NEGATIVE 08/13/2016 1820   Sepsis Labs: @LABRCNTIP (procalcitonin:4,lacticidven:4)  ) Recent Results (from the past 240 hour(s))  MRSA PCR Screening     Status: None   Collection Time: 08/14/16  1:35 AM  Result Value Ref Range Status   MRSA by PCR NEGATIVE NEGATIVE Final    Comment:        The GeneXpert MRSA Assay (FDA approved for NASAL specimens only), is one component of a comprehensive MRSA colonization surveillance program. It is not intended to diagnose MRSA infection nor to guide or monitor treatment  for MRSA infections.       Radiology Studies: Ct Abdomen Pelvis Wo Contrast  Result Date: 08/21/2016 CLINICAL DATA:  RECENT TRAUMA. FELL FROM TREE. RIGHT-SIDED RIB FRACTURES AND PNEUMOTHORAX. EXAM: CT ABDOMEN AND PELVIS WITHOUT CONTRAST TECHNIQUE: Multidetector CT imaging of the abdomen and pelvis was performed following the standard protocol without IV contrast. COMPARISON:  CT of the abdomen and pelvis 08/13/2016. Abdominal radiographs 08/21/2016. FINDINGS: Lower chest: Bilateral pleural effusions are present, right greater than left. Pneumomediastinum is again seen. Dense subcutaneous emphysema coronary artery calcifications are evident. Subcutaneous emphysema is worse right than left. Hepatobiliary: Punctate calcifications are again noted in the liver. No focal liver lesion or trauma is evident. The common bile duct and gallbladder are within normal limits. Pancreas: Unremarkable. No pancreatic ductal dilatation or surrounding inflammatory changes. Spleen: Normal in size without focal abnormality. Adrenals/Urinary Tract: The adrenal glands are normal bilaterally. Bilateral renal cysts are again noted, right greater than left. Ureters are within normal limits. The urinary bladder is unremarkable. Stomach/Bowel: Stomach and duodenum are within normal limits. Small bowel is unremarkable. Bowel is no longer herniated into the  right inguinal canal. The appendix is visualized and normal. The ascending and transverse colon are normal. The descending and sigmoid colon are within normal limits. Vascular/Lymphatic: Atherosclerotic calcifications are again seen without aneurysm. There is no significant adenopathy. Reproductive: Prostate is unremarkable. Other: High-density fluid layering within the abdomen and pelvis compatible with pneumoperitoneum. No discrete source is identified. Blood extends into the right inguinal canal along with fat. Musculoskeletal: Multilevel degenerative changes are again noted in the lower lumbar spine. Anterior costochondral fractures are again seen. No new fractures are evident. IMPRESSION: 1. Pneumoperitoneum likely accounts for the drop in hemoglobin. 2. No discrete etiology of the pneumoperitoneum is evident. 3. Pneumomediastinum. 4. Extensive subcutaneous emphysema is most prominent at the right chest and extends into the right abdomen. 5. Large right inguinal hernia contains fat and fluid, likely blood. Bowel is no longer present within hernia. 6. Atherosclerosis. 7. Degenerative changes of the lower lumbar spine. 8. Anterior costochondral fractures on the right. These results were called by telephone at the time of interpretation on 08/21/2016 at 8:44 pm to Dr. Stark Klein, who verbally acknowledged these results. Electronically Signed   By: San Morelle M.D.   On: 08/21/2016 20:53   Dg Chest 2 View  Result Date: 08/20/2016 CLINICAL DATA:  Chest pain and diaphoresis. History of hypertension, aortic stenosis, ileus. Former smoker. EXAM: CHEST  2 VIEW COMPARISON:  Chest x-rays dated 08/15/2016 and 04/29/2015. Comparison also made to chest CT of 08/13/2016. FINDINGS: Study is hypoinspiratory with crowding of the perihilar and bibasilar bronchovascular markings. Suspect at least mild edema and/or atelectasis within the right lower lung, versus asymmetrically prominent hypoinspiratory changes. Right  pleural effusion, small to moderate in size. Heart size and mediastinal contours are stable. Again noted is subcutaneous emphysema overlying the right chest wall, similar to previous exams. No pneumothorax seen. IMPRESSION: 1. Low lung volumes. Right pleural effusion, small to moderate in size. Suspect additional edema and/or atelectasis at the right lung base. 2. Subcutaneous emphysema overlying the right chest wall, similar to previous exams. 3. No pneumothorax seen, possibly resolved since earlier exams, possibly obscured by the overlying subcutaneous emphysema. Electronically Signed   By: Franki Cabot M.D.   On: 08/20/2016 11:59   Dg Abd 1 View  Result Date: 08/21/2016 CLINICAL DATA:  Ileus, NG tube placement EXAM: ABDOMEN - 1 VIEW COMPARISON:  08/20/2016 FINDINGS: A gastric tube  tip and side-port are seen in the proximal body of the stomach. Contrast filled large bowel is noted with filling of the appendix. Dilated small bowel loops are present within the abdomen and pelvis. Findings are in keeping with an ileus given persistent oral contrast within large bowel. IMPRESSION: Gastric tube in the proximal stomach. Bowel gas pattern suggests an ileus. Electronically Signed   By: Ashley Royalty M.D.   On: 08/21/2016 03:34   Dg Abd 2 Views  Result Date: 08/21/2016 CLINICAL DATA:  Ileus EXAM: ABDOMEN - 2 VIEW COMPARISON:  08/21/2016 FINDINGS: Contrast material remains throughout the colon similar to that seen on the prior exam from a prior CT study. The nasogastric catheter has been removed in the interval. Right-sided pleural effusion is again identified. No obstructive changes are seen. No free air is noted. Degenerative changes of the lumbar spine are seen. IMPRESSION: No acute abnormality is noted aside from a right pleural effusion. Stable contrast throughout the colon. Electronically Signed   By: Inez Catalina M.D.   On: 08/21/2016 08:46   Dg Abd Portable 1v  Result Date: 08/20/2016 CLINICAL DATA:   Nausea and abdominal pain EXAM: PORTABLE ABDOMEN - 1 VIEW COMPARISON:  Abdominal radiograph 08/19/2016 FINDINGS: There is contrast material within the ascending colon and appendix. There is a large amount of stool throughout the remainder of the colon. Gas-filled loops of small bowel are again seen. IMPRESSION: Large amount of stool in the colon with contrast material opacifying the ascending colon. Multiple gas-filled loops of small bowel. Findings are again suggestive of adynamic ileus. Electronically Signed   By: Ulyses Jarred M.D.   On: 08/20/2016 20:18     Scheduled Meds: . sodium chloride   Intravenous Once  . aspirin EC  81 mg Oral Daily  . cyclobenzaprine  5 mg Oral TID  . docusate sodium  100 mg Oral BID  . escitalopram  20 mg Oral Daily  . feeding supplement (ENSURE ENLIVE)  237 mL Oral BID BM  . hydrochlorothiazide  12.5 mg Oral Daily  . irbesartan  75 mg Oral Daily  . ketorolac  15 mg Intravenous Q6H  . pantoprazole  40 mg Oral Daily  . polyethylene glycol  17 g Oral Daily  . simvastatin  20 mg Oral QHS  . traMADol  50 mg Oral Q6H   Continuous Infusions: . 0.9 % NaCl with KCl 20 mEq / L 50 mL/hr at 08/21/16 2348  . dextrose 5 % and 0.9% NaCl 125 mL/hr at 08/22/16 0312     LOS: 9 days   Time Spent in minutes   30 minutes  Genna Casimir D.O. on 08/22/2016 at 11:11 AM  Between 7am to 7pm - Pager - [phone]  After 7pm go to [email or web address] - password [email or web address]  And look for the night coverage person covering for me after hours  [facility] Office  [phone]

## 2016-08-22 NOTE — Progress Notes (Signed)
Pt agreed to have IV replaced after family talked him into receiving IV fluids. IV team consulted.

## 2016-08-22 NOTE — Progress Notes (Signed)
Subjective: Had a BM, feeling somewhat better, still feels bloated  Objective: Vital signs in last 24 hours: Temp:  [97.5 F (36.4 C)-98.6 F (37 C)] 98.3 F (36.8 C) (03/12 0525) Pulse Rate:  [64-76] 64 (03/12 0525) Resp:  [15-26] 21 (03/12 0525) BP: (100-141)/(48-86) 112/54 (03/12 0525) SpO2:  [99 %-100 %] 99 % (03/12 0525) Weight:  [84.1 kg (185 lb 6.5 oz)] 84.1 kg (185 lb 6.5 oz) (03/12 0525) Last BM Date: 08/21/16  Intake/Output from previous day: 03/11 0701 - 03/12 0700 In: 3191 [P.O.:718; I.V.:1425; Blood:1048] Out: 425 [Urine:425] Intake/Output this shift: No intake/output data recorded.  General appearance: cooperative Resp: clear to auscultation bilaterally Cardio: regular rate and rhythm GI: moderate distention, active BS, not sig tender now  MS: alert, answers questions  Lab Results: CBC   Recent Labs  08/21/16 0313  08/21/16 2339 08/22/16 0322  WBC 6.8  --   --  4.3  HGB 7.2*  < > 9.6* 8.4*  HCT 22.3*  < > 28.8* 25.8*  PLT 234  --   --  179  < > = values in this interval not displayed. BMET  Recent Labs  08/21/16 0313 08/22/16 0322  NA 137 138  K 4.8 4.2  CL 106 111  CO2 25 23  GLUCOSE 162* 123*  BUN 27* 19  CREATININE 1.87* 1.15  CALCIUM 8.1* 7.6*   PT/INR No results for input(s): LABPROT, INR in the last 72 hours. ABG No results for input(s): PHART, HCO3 in the last 72 hours.  Invalid input(s): PCO2, PO2  Studies/Results: Ct Abdomen Pelvis Wo Contrast  Result Date: 08/21/2016 CLINICAL DATA:  RECENT TRAUMA. FELL FROM TREE. RIGHT-SIDED RIB FRACTURES AND PNEUMOTHORAX. EXAM: CT ABDOMEN AND PELVIS WITHOUT CONTRAST TECHNIQUE: Multidetector CT imaging of the abdomen and pelvis was performed following the standard protocol without IV contrast. COMPARISON:  CT of the abdomen and pelvis 08/13/2016. Abdominal radiographs 08/21/2016. FINDINGS: Lower chest: Bilateral pleural effusions are present, right greater than left. Pneumomediastinum is  again seen. Dense subcutaneous emphysema coronary artery calcifications are evident. Subcutaneous emphysema is worse right than left. Hepatobiliary: Punctate calcifications are again noted in the liver. No focal liver lesion or trauma is evident. The common bile duct and gallbladder are within normal limits. Pancreas: Unremarkable. No pancreatic ductal dilatation or surrounding inflammatory changes. Spleen: Normal in size without focal abnormality. Adrenals/Urinary Tract: The adrenal glands are normal bilaterally. Bilateral renal cysts are again noted, right greater than left. Ureters are within normal limits. The urinary bladder is unremarkable. Stomach/Bowel: Stomach and duodenum are within normal limits. Small bowel is unremarkable. Bowel is no longer herniated into the right inguinal canal. The appendix is visualized and normal. The ascending and transverse colon are normal. The descending and sigmoid colon are within normal limits. Vascular/Lymphatic: Atherosclerotic calcifications are again seen without aneurysm. There is no significant adenopathy. Reproductive: Prostate is unremarkable. Other: High-density fluid layering within the abdomen and pelvis compatible with pneumoperitoneum. No discrete source is identified. Blood extends into the right inguinal canal along with fat. Musculoskeletal: Multilevel degenerative changes are again noted in the lower lumbar spine. Anterior costochondral fractures are again seen. No new fractures are evident. IMPRESSION: 1. Pneumoperitoneum likely accounts for the drop in hemoglobin. 2. No discrete etiology of the pneumoperitoneum is evident. 3. Pneumomediastinum. 4. Extensive subcutaneous emphysema is most prominent at the right chest and extends into the right abdomen. 5. Large right inguinal hernia contains fat and fluid, likely blood. Bowel is no longer present within hernia. 6.  Atherosclerosis. 7. Degenerative changes of the lower lumbar spine. 8. Anterior costochondral  fractures on the right. These results were called by telephone at the time of interpretation on 08/21/2016 at 8:44 pm to Dr. Stark Klein, who verbally acknowledged these results. Electronically Signed   By: San Morelle M.D.   On: 08/21/2016 20:53   Dg Chest 2 View  Result Date: 08/20/2016 CLINICAL DATA:  Chest pain and diaphoresis. History of hypertension, aortic stenosis, ileus. Former smoker. EXAM: CHEST  2 VIEW COMPARISON:  Chest x-rays dated 08/15/2016 and 04/29/2015. Comparison also made to chest CT of 08/13/2016. FINDINGS: Study is hypoinspiratory with crowding of the perihilar and bibasilar bronchovascular markings. Suspect at least mild edema and/or atelectasis within the right lower lung, versus asymmetrically prominent hypoinspiratory changes. Right pleural effusion, small to moderate in size. Heart size and mediastinal contours are stable. Again noted is subcutaneous emphysema overlying the right chest wall, similar to previous exams. No pneumothorax seen. IMPRESSION: 1. Low lung volumes. Right pleural effusion, small to moderate in size. Suspect additional edema and/or atelectasis at the right lung base. 2. Subcutaneous emphysema overlying the right chest wall, similar to previous exams. 3. No pneumothorax seen, possibly resolved since earlier exams, possibly obscured by the overlying subcutaneous emphysema. Electronically Signed   By: Franki Cabot M.D.   On: 08/20/2016 11:59   Dg Abd 1 View  Result Date: 08/21/2016 CLINICAL DATA:  Ileus, NG tube placement EXAM: ABDOMEN - 1 VIEW COMPARISON:  08/20/2016 FINDINGS: A gastric tube tip and side-port are seen in the proximal body of the stomach. Contrast filled large bowel is noted with filling of the appendix. Dilated small bowel loops are present within the abdomen and pelvis. Findings are in keeping with an ileus given persistent oral contrast within large bowel. IMPRESSION: Gastric tube in the proximal stomach. Bowel gas pattern suggests  an ileus. Electronically Signed   By: Ashley Royalty M.D.   On: 08/21/2016 03:34   Dg Abd 2 Views  Result Date: 08/21/2016 CLINICAL DATA:  Ileus EXAM: ABDOMEN - 2 VIEW COMPARISON:  08/21/2016 FINDINGS: Contrast material remains throughout the colon similar to that seen on the prior exam from a prior CT study. The nasogastric catheter has been removed in the interval. Right-sided pleural effusion is again identified. No obstructive changes are seen. No free air is noted. Degenerative changes of the lumbar spine are seen. IMPRESSION: No acute abnormality is noted aside from a right pleural effusion. Stable contrast throughout the colon. Electronically Signed   By: Inez Catalina M.D.   On: 08/21/2016 08:46   Dg Abd Portable 1v  Result Date: 08/20/2016 CLINICAL DATA:  Nausea and abdominal pain EXAM: PORTABLE ABDOMEN - 1 VIEW COMPARISON:  Abdominal radiograph 08/19/2016 FINDINGS: There is contrast material within the ascending colon and appendix. There is a large amount of stool throughout the remainder of the colon. Gas-filled loops of small bowel are again seen. IMPRESSION: Large amount of stool in the colon with contrast material opacifying the ascending colon. Multiple gas-filled loops of small bowel. Findings are again suggestive of adynamic ileus. Electronically Signed   By: Ulyses Jarred M.D.   On: 08/20/2016 20:18    Anti-infectives: Anti-infectives    None      Assessment/Plan: 20-25 foot fall from tree Right PTX with subcutaneous emphysema- some pneumomediastinum still on CT Right sided costochondral fractures (4-6) sternum is intact Small anterior mediastinal hematoma Hemoperitoneum - no clear source, check CBC again this PM, abd exam seems improved, continue clears  plus ensure for now Right ingunial hernia containing loops of small bowel and colonic diverticulosis- now reduced as seen on CT VTE- Lovenox, SCD FEN-  IVF,encourage oral intake, speech therapy recommends thin liquids   Disposition/Plan - continue therapies I spoke with his family at the bedside and I spoke with Dr. Ree Kida.   LOS: 9 days    Georganna Skeans, MD, MPH, FACS Trauma: [phone] General Surgery: [phone]  3/12/2018Patient ID: [name], male   DOB: 18-Nov-1927, 83 y.o.   MRN: [id number]

## 2016-08-22 NOTE — Progress Notes (Signed)
Occupational Therapy Treatment Patient Details Name: [name] MRN: [id number] DOB: [birth year] Today's Date: 08/22/2016    History of present illness Pt adm after 25 foot fall from a tree and suffered rt rib fx's and small rt pneumothorax. PMH - HTN, RA, rt rotator cuff problems, Hep A, and Depression.   OT comments  Pt making slow progress after a rough weekend and evening.  Pts family was present for session.  Pt feels very weak and wants to do for himself but is not safe up walking along yet so feel he may be a fall risk.  Daughter was going to stay with pt after d/c until this Saturday but has return to [place] on Saturday.  Now, it appears pt may be home alone since daughter leaving and pt d/cing later than expected. For this reason, pt may want to consider SNF rehab.  Pt is beginning to mobilize better but will not be able to tend to his ADLS a home alone.  If 24/7 assist available feel pt could go home but if not, pt will need to SNF rehab.  Follow Up Recommendations  SNF;Supervision/Assistance - 24 hour    Equipment Recommendations  3 in 1 bedside commode    Recommendations for Other Services      Precautions / Restrictions Precautions Precautions: Fall Precaution Comments: Pt accustomed to being independent so needs chair alarm when up.  Not safe to get up alone Restrictions Weight Bearing Restrictions: No       Mobility Bed Mobility Overal bed mobility: Needs Assistance Bed Mobility: Supine to Sit;Sit to Supine     Supine to sit: Min assist Sit to supine: Min assist   General bed mobility comments: min assist for LEs and to pull up in bed with gravity assist  Transfers Overall transfer level: Needs assistance Equipment used: Rolling walker (2 wheeled) Transfers: Sit to/from Stand Sit to Stand: Min guard         General transfer comment: increased time, min guard assist for safety    Balance Overall balance assessment: Needs assistance Sitting-balance  support: No upper extremity supported;Feet supported Sitting balance-Leahy Scale: Good     Standing balance support: During functional activity;Bilateral upper extremity supported Standing balance-Leahy Scale: Poor Standing balance comment: Pt must have walker for outside support.                   ADL Overall ADL's : Needs assistance/impaired Eating/Feeding: Independent;Sitting   Grooming: Wash/dry hands;Wash/dry face;Oral care;Supervision/safety;Standing Grooming Details (indicate cue type and reason): Pt stood at sink for appx 6 minutes with S. Upper Body Bathing: Set up;Sitting   Lower Body Bathing: Moderate assistance;Sit to/from stand Lower Body Bathing Details (indicate cue type and reason): followed up on use of long sponge         Toilet Transfer: Min guard;RW;Cueing for Office manager Details (indicate cue type and reason): pt walked to bathroom. cues to reach back when sitting and to keep walker close by. Toileting- Water quality scientist and Hygiene: Min guard;Sit to/from stand;Cueing for safety Toileting - Clothing Manipulation Details (indicate cue type and reason): cues to not get up alone. Do not leave in bathroom alone; pt will get up.     Functional mobility during ADLs: Minimal assistance;Rolling walker;Cueing for sequencing;Cueing for safety General ADL Comments: Educated family in safe techniques and need for someone to be with pt at all times at this point.      Vision  Perception     Praxis      Cognition   Behavior During Therapy: WFL for tasks assessed/performed Overall Cognitive Status: Within Functional Limits for tasks assessed                         Exercises     Shoulder Instructions       General Comments      Pertinent Vitals/ Pain       Pain Assessment: Faces Faces Pain Scale: Hurts even more Pain Location: ribs Pain Descriptors / Indicators: Sore;Grimacing;Guarding Pain  Intervention(s): Limited activity within patient's tolerance;Premedicated before session;Relaxation  Home Living                                          Prior Functioning/Environment              Frequency  Min 2X/week        Progress Toward Goals  OT Goals(current goals can now be found in the care plan section)  Progress towards OT goals: Progressing toward goals  Acute Rehab OT Goals Patient Stated Goal: to return home OT Goal Formulation: With patient/family Time For Goal Achievement: 08/23/16 Potential to Achieve Goals: Good ADL Goals Pt Will Perform Grooming: with supervision;standing Pt Will Perform Lower Body Bathing: with supervision;with adaptive equipment;sit to/from stand Pt Will Perform Lower Body Dressing: with supervision;with adaptive equipment;sit to/from stand Pt Will Transfer to Toilet: with supervision;ambulating;bedside commode Pt Will Perform Toileting - Clothing Manipulation and hygiene: with supervision;sit to/from stand Additional ADL Goal #1: Pt will perform bed mobility with supervision.  Plan Discharge plan needs to be updated    Co-evaluation                 End of Session Equipment Utilized During Treatment: Rolling walker  OT Visit Diagnosis: Unsteadiness on feet (R26.81)   Activity Tolerance Patient limited by pain   Patient Left in bed;with call bell/phone within reach;with family/visitor present   Nurse Communication Mobility status        Time: 2919-1660 OT Time Calculation (min): 30 min  Charges: OT General Charges $OT Visit: 1 Procedure OT Treatments $Self Care/Home Management : 23-37 mins  Jinger Neighbors, OTR/L [phone]   Glenford Peers 08/22/2016, 12:00 PM

## 2016-08-23 ENCOUNTER — Inpatient Hospital Stay (HOSPITAL_COMMUNITY): Payer: Medicare Other

## 2016-08-23 DIAGNOSIS — R140 Abdominal distension (gaseous): Secondary | ICD-10-CM

## 2016-08-23 DIAGNOSIS — Z5189 Encounter for other specified aftercare: Secondary | ICD-10-CM

## 2016-08-23 DIAGNOSIS — R142 Eructation: Secondary | ICD-10-CM

## 2016-08-23 DIAGNOSIS — R1310 Dysphagia, unspecified: Secondary | ICD-10-CM

## 2016-08-23 DIAGNOSIS — K560 Paralytic ileus: Secondary | ICD-10-CM

## 2016-08-23 DIAGNOSIS — R141 Gas pain: Secondary | ICD-10-CM

## 2016-08-23 DIAGNOSIS — R52 Pain, unspecified: Secondary | ICD-10-CM

## 2016-08-23 DIAGNOSIS — S270XXA Traumatic pneumothorax, initial encounter: Principal | ICD-10-CM

## 2016-08-23 DIAGNOSIS — I5031 Acute diastolic (congestive) heart failure: Secondary | ICD-10-CM

## 2016-08-23 DIAGNOSIS — R110 Nausea: Secondary | ICD-10-CM

## 2016-08-23 DIAGNOSIS — K668 Other specified disorders of peritoneum: Secondary | ICD-10-CM

## 2016-08-23 DIAGNOSIS — R143 Flatulence: Secondary | ICD-10-CM

## 2016-08-23 DIAGNOSIS — D62 Acute posthemorrhagic anemia: Secondary | ICD-10-CM

## 2016-08-23 DIAGNOSIS — K661 Hemoperitoneum: Secondary | ICD-10-CM

## 2016-08-23 DIAGNOSIS — S270XXD Traumatic pneumothorax, subsequent encounter: Secondary | ICD-10-CM

## 2016-08-23 DIAGNOSIS — I10 Essential (primary) hypertension: Secondary | ICD-10-CM

## 2016-08-23 DIAGNOSIS — R0682 Tachypnea, not elsewhere classified: Secondary | ICD-10-CM

## 2016-08-23 LAB — BASIC METABOLIC PANEL
ANION GAP: 5 (ref 5–15)
BUN: 13 mg/dL (ref 6–20)
CHLORIDE: 112 mmol/L — AB (ref 101–111)
CO2: 22 mmol/L (ref 22–32)
Calcium: 7.7 mg/dL — ABNORMAL LOW (ref 8.9–10.3)
Creatinine, Ser: 0.97 mg/dL (ref 0.61–1.24)
GFR calc non Af Amer: >60 mL/min (ref >60)
Glucose, Bld: 93 mg/dL (ref 65–99)
Potassium: 4.3 mmol/L (ref 3.5–5.1)
Sodium: 139 mmol/L (ref 135–145)

## 2016-08-23 LAB — CBC
HEMATOCRIT: 26.6 % — AB (ref 39.0–52.0)
Hemoglobin: 8.5 g/dL — ABNORMAL LOW (ref 13.0–17.0)
MCH: 28.6 pg (ref 26.0–34.0)
MCHC: 32 g/dL (ref 30.0–36.0)
MCV: 89.6 fL (ref 78.0–100.0)
Platelets: 192 10*3/uL (ref 150–400)
RBC: 2.97 MIL/uL — ABNORMAL LOW (ref 4.22–5.81)
RDW: 15.6 % — ABNORMAL HIGH (ref 11.5–15.5)
WBC: 4.2 10*3/uL (ref 4.0–10.5)

## 2016-08-23 MED ORDER — FUROSEMIDE 10 MG/ML IJ SOLN
20.0000 mg | Freq: Once | INTRAMUSCULAR | Status: AC
  Administered 2016-08-23: 20 mg via INTRAVENOUS
  Filled 2016-08-23: qty 2

## 2016-08-23 MED ORDER — IPRATROPIUM-ALBUTEROL 0.5-2.5 (3) MG/3ML IN SOLN
3.0000 mL | RESPIRATORY_TRACT | Status: DC | PRN
  Administered 2016-08-23 – 2016-08-26 (×3): 3 mL via RESPIRATORY_TRACT
  Filled 2016-08-23 (×2): qty 3

## 2016-08-23 MED ORDER — IPRATROPIUM-ALBUTEROL 0.5-2.5 (3) MG/3ML IN SOLN: 3.0000 mL | Freq: Four times a day (QID) | RESPIRATORY_TRACT | Status: DC

## 2016-08-23 MED ORDER — IPRATROPIUM-ALBUTEROL 0.5-2.5 (3) MG/3ML IN SOLN
3.0000 mL | RESPIRATORY_TRACT | Status: DC
  Administered 2016-08-23 – 2016-08-24 (×7): 3 mL via RESPIRATORY_TRACT
  Filled 2016-08-23 (×6): qty 3

## 2016-08-23 MED ORDER — LORAZEPAM 2 MG/ML IJ SOLN
0.5000 mg | Freq: Once | INTRAMUSCULAR | Status: AC
  Administered 2016-08-23: 0.5 mg via INTRAVENOUS
  Filled 2016-08-23: qty 1

## 2016-08-23 NOTE — Progress Notes (Signed)
PROGRESS NOTE    [name]  HKV:[id number] DOB: 04-Jul-1927 DOA: 08/13/2016 PCP: [name], MD   Chief Complaint  Patient presents with  . Trauma     Brief Narrative:  Consulted for medical management.   Assessment & Plan   Anemia -?Blood loss, internal bleeding -CT abd/pelvis listed below -FOBT negative -Anemia panel pending -Hemoglobin on admission 13, dropped to 7 -Transfused 2u PRBCs 3/11 -Hemoglobin currently 8.5 -CT abd/pelvis showed hemoperitoneum (Spoke with Dr. Grandville Silos, patient possibly has a slow bleed, however, since his abd exam has improved, will continue to monitor CBC closely)  Acute kidney injury -likely secondary to anemia and poor oral intake -Creatinine peaked to 1.87, currently 0.97 -Continue to monitor BMP  Esophageal dysphagia and ileus -defer to primary team and gastroenterology -CT abd/pelvis address above -Continue bowel regimen -Had NG tube, but pulled it out -Denies nausea/vomiting -Feels abd pain is improving- able to have bowel movements -GI signed off -Diet will be advanced to full liquids  Right pneumothorax -5% of lung volume, present on admission -was conservative treated. -Continue nebs and incentive spirometry, pulm toilet  -Noted on CT abd/pelvis, pneumomediastinum  Elevated troponin/Transient diaphoresis today, w/ diffuse pains -felt multifactorial, including pain, ileus, anemia, demand ischemia -Troponin mildly elevated, but flat -Echocardiogram showed EF 56-43%, grade 1 diastolic dysfunction  Dyspnea/?Acute diastolic heart failure -Patient was receiving IVF, discontinued -Given dose of IV lasix -Monitor intake/output daily weights -Repeat CXR -Will give neb treatments, scheduled and PRN -of note, patient uses HCTZ at home  Hypertension -Felt may be exacerbated by pain/ileus as well. -continue HCTZ and irbesartan   Hyperlipidemina -continue asa and statin  GERD -Continue  PPI  Depression -Continue On lexapro  Deconditioning/Fall -PT recommended CIR -Inpatient rehab consulted  DVT Prophylaxis  Discontinued lovenox given anemia, SCDs  Code Status: Full  Family Communication: Family at bedside, and on the phone. Updated  Disposition Plan: Admitted, per primary  Consultants [facility] Cardiology Gastroenterology  CIR  Procedures    Antibiotics   Anti-infectives    None      Subjective:   [name] seen and examined today. Feels short of breath today.  Was able to have several bowel movements yesterday. Denies nausea or vomiting, abdominal pain, chest pain.   Objective:   Vitals:   08/22/16 2010 08/22/16 2204 08/23/16 0047 08/23/16 0424  BP: (!) 126/59  (!) 119/55 133/62  Pulse: 64  63 (!) 59  Resp: (!) 26  20 (!) 22  Temp: 99.2 F (37.3 C)  98.3 F (36.8 C) 98.2 F (36.8 C)  TempSrc: Oral  Oral Oral  SpO2: 99% 100% 94% 96%  Weight:    87.6 kg (193 lb 2 oz)  Height:        Intake/Output Summary (Last 24 hours) at 08/23/16 1025 Last data filed at 08/23/16 0900  Gross per 24 hour  Intake             2295 ml  Output              550 ml  Net             1745 ml   Filed Weights   08/21/16 0351 08/22/16 0525 08/23/16 0424  Weight: 79 kg (174 lb 2.6 oz) 84.1 kg (185 lb 6.5 oz) 87.6 kg (193 lb 2 oz)    Exam  General: Well developed, well nourished,   HEENT: NCAT, mucous membranes moist.   Cardiovascular: S1 S2 auscultated,RRR  MSK chest wall TTP  Respiratory: Diminished breath sounds, exp wheezing  Abdomen: Soft, nontender, distended, + bowel sounds  Extremities: warm dry without cyanosis clubbing or edema  Neuro: AAOx3, nonfocal  Psych: Normal affect and demeanor   Data Reviewed: I have personally reviewed following labs and imaging studies  CBC:  Recent Labs Lab 08/20/16 1026 08/21/16 0313 08/21/16 0511 08/21/16 2339 08/22/16 0322 08/22/16 1332 08/23/16 0334  WBC 8.9 6.8  --   --  4.3 4.4 4.2  HGB  9.7* 7.2* 7.1* 9.6* 8.4* 9.9* 8.5*  HCT 30.3* 22.3* 21.9* 28.8* 25.8* 31.3* 26.6*  MCV 91.0 91.0  --   --  88.4 89.4 89.6  PLT 242 234  --   --  179 191 621   Basic Metabolic Panel:  Recent Labs Lab 08/18/16 0342 08/20/16 0513 08/20/16 1026 08/21/16 0313 08/21/16 0844 08/22/16 0322 08/23/16 0334  NA 140  --  137 137  --  138 139  K 4.6  --  4.2 4.8  --  4.2 4.3  CL 107  --  104 106  --  111 112*  CO2 27  --  23 25  --  23 22  GLUCOSE 98  --  142* 162*  --  123* 93  BUN 20  --  13 27*  --  19 13  CREATININE 1.03 1.00 1.06 1.87*  --  1.15 0.97  CALCIUM 8.4*  --  8.3* 8.1*  --  7.6* 7.7*  MG  --   --   --   --  2.1  --   --    GFR: Estimated Creatinine Clearance: 59.9 mL/min (by C-G formula based on SCr of 0.97 mg/dL). Liver Function Tests:  Recent Labs Lab 08/20/16 1026 08/21/16 0313  AST 42* 39  ALT 39 34  ALKPHOS 54 50  BILITOT 2.1* 1.4*  PROT 5.9* 5.3*  ALBUMIN 3.0* 2.7*    Recent Labs Lab 08/20/16 1026  LIPASE 14   No results for input(s): AMMONIA in the last 168 hours. Coagulation Profile: No results for input(s): INR, PROTIME in the last 168 hours. Cardiac Enzymes:  Recent Labs Lab 08/20/16 1026 08/20/16 1458 08/20/16 1905 08/21/16 0313 08/21/16 0511  TROPONINI 0.05* 0.13* 0.14* 0.10* 0.10*   BNP (last 3 results) No results for input(s): PROBNP in the last 8760 hours. HbA1C: No results for input(s): HGBA1C in the last 72 hours. CBG: No results for input(s): GLUCAP in the last 168 hours. Lipid Profile: No results for input(s): CHOL, HDL, LDLCALC, TRIG, CHOLHDL, LDLDIRECT in the last 72 hours. Thyroid Function Tests: No results for input(s): TSH, T4TOTAL, FREET4, T3FREE, THYROIDAB in the last 72 hours. Anemia Panel: No results for input(s): VITAMINB12, FOLATE, FERRITIN, TIBC, IRON, RETICCTPCT in the last 72 hours. Urine analysis:    Component Value Date/Time   COLORURINE YELLOW 08/13/2016 1820   APPEARANCEUR CLEAR 08/13/2016 1820    LABSPEC 1.038 (H) 08/13/2016 1820   PHURINE 5.0 08/13/2016 1820   GLUCOSEU NEGATIVE 08/13/2016 1820   HGBUR LARGE (A) 08/13/2016 1820   BILIRUBINUR NEGATIVE 08/13/2016 1820   KETONESUR 20 (A) 08/13/2016 1820   PROTEINUR NEGATIVE 08/13/2016 1820   UROBILINOGEN 1.0 04/14/2014 0116   NITRITE NEGATIVE 08/13/2016 1820   LEUKOCYTESUR NEGATIVE 08/13/2016 1820   Sepsis Labs: @LABRCNTIP (procalcitonin:4,lacticidven:4)  ) Recent Results (from the past 240 hour(s))  MRSA PCR Screening     Status: None   Collection Time: 08/14/16  1:35 AM  Result Value Ref Range Status   MRSA by PCR NEGATIVE NEGATIVE Final  Comment:        The GeneXpert MRSA Assay (FDA approved for NASAL specimens only), is one component of a comprehensive MRSA colonization surveillance program. It is not intended to diagnose MRSA infection nor to guide or monitor treatment for MRSA infections.       Radiology Studies: Ct Abdomen Pelvis Wo Contrast  Result Date: 08/21/2016 CLINICAL DATA:  RECENT TRAUMA. FELL FROM TREE. RIGHT-SIDED RIB FRACTURES AND PNEUMOTHORAX. EXAM: CT ABDOMEN AND PELVIS WITHOUT CONTRAST TECHNIQUE: Multidetector CT imaging of the abdomen and pelvis was performed following the standard protocol without IV contrast. COMPARISON:  CT of the abdomen and pelvis 08/13/2016. Abdominal radiographs 08/21/2016. FINDINGS: Lower chest: Bilateral pleural effusions are present, right greater than left. Pneumomediastinum is again seen. Dense subcutaneous emphysema coronary artery calcifications are evident. Subcutaneous emphysema is worse right than left. Hepatobiliary: Punctate calcifications are again noted in the liver. No focal liver lesion or trauma is evident. The common bile duct and gallbladder are within normal limits. Pancreas: Unremarkable. No pancreatic ductal dilatation or surrounding inflammatory changes. Spleen: Normal in size without focal abnormality. Adrenals/Urinary Tract: The adrenal glands are normal  bilaterally. Bilateral renal cysts are again noted, right greater than left. Ureters are within normal limits. The urinary bladder is unremarkable. Stomach/Bowel: Stomach and duodenum are within normal limits. Small bowel is unremarkable. Bowel is no longer herniated into the right inguinal canal. The appendix is visualized and normal. The ascending and transverse colon are normal. The descending and sigmoid colon are within normal limits. Vascular/Lymphatic: Atherosclerotic calcifications are again seen without aneurysm. There is no significant adenopathy. Reproductive: Prostate is unremarkable. Other: High-density fluid layering within the abdomen and pelvis compatible with pneumoperitoneum. No discrete source is identified. Blood extends into the right inguinal canal along with fat. Musculoskeletal: Multilevel degenerative changes are again noted in the lower lumbar spine. Anterior costochondral fractures are again seen. No new fractures are evident. IMPRESSION: 1. Pneumoperitoneum likely accounts for the drop in hemoglobin. 2. No discrete etiology of the pneumoperitoneum is evident. 3. Pneumomediastinum. 4. Extensive subcutaneous emphysema is most prominent at the right chest and extends into the right abdomen. 5. Large right inguinal hernia contains fat and fluid, likely blood. Bowel is no longer present within hernia. 6. Atherosclerosis. 7. Degenerative changes of the lower lumbar spine. 8. Anterior costochondral fractures on the right. These results were called by telephone at the time of interpretation on 08/21/2016 at 8:44 pm to Dr. Stark Klein, who verbally acknowledged these results. Electronically Signed   By: San Morelle M.D.   On: 08/21/2016 20:53     Scheduled Meds: . sodium chloride   Intravenous Once  . aspirin EC  81 mg Oral Daily  . cyclobenzaprine  5 mg Oral TID  . docusate sodium  100 mg Oral BID  . escitalopram  20 mg Oral Daily  . feeding supplement (ENSURE ENLIVE)  237 mL  Oral BID BM  . hydrochlorothiazide  12.5 mg Oral Daily  . ipratropium-albuterol  3 mL Nebulization Q4H  . ipratropium-albuterol  3 mL Nebulization Q6H  . irbesartan  75 mg Oral Daily  . ketorolac  15 mg Intravenous Q6H  . LORazepam  0.5 mg Intravenous Once  . pantoprazole  40 mg Oral Daily  . polyethylene glycol  17 g Oral Daily  . simvastatin  20 mg Oral QHS  . traMADol  50 mg Oral Q6H   Continuous Infusions:    LOS: 10 days   Time Spent in minutes   30 minutes  Wil Slape D.O. on 08/23/2016 at 10:25 AM  Between 7am to 7pm - Pager - [phone]  After 7pm go to [email or web address] - password [email or web address]  And look for the night coverage person covering for me after hours  [facility] Office  [phone]

## 2016-08-23 NOTE — Progress Notes (Signed)
Case Management Note  Patient Details Name: [name] MRN: [id number] Date of Birth: 1928/01/15  Subjective/Objective:Pt admitted on 08/13/16 after 25 foot fall from a tree; he sustained RT rib fx and small RT PTX. PTA, pt independent, lives alone; his daughter lives in the house next to him.   Action/Plan: PT/OT recommending HH follow up, RW, and 3 in 1 BSC. Pt's daughter from out of town will stay with pt until 3/17 to provide 24hr assistance.   Expected Discharge Date:  Expected Discharge Plan: Skilled Nursing Facility In-House Referral: CSW  Discharge planning ServicesCM Consult  Post Acute Care Choice: Home Health Choice offered to: POA/Adult Children  DME Arranged: RW, 3 in 1 [facility] DME Agency: [place]: PT/OT HH Agency: [facility]  Status of Service: In process, will continue to follow  If discussed at Long Length of Stay Meetings, dates discussed: 08/18/16  Additional Comments: 08/19/16 1630 Met with pt and daughter to discuss discharge planning.  Daughter states she does not think pt is ready to dc, as he is not eating or drinking; feels bloated.  MD in to see patient; dc put on hold and KUB ordered to R/O ileus.  Pt and daughter agreeable with arrangement of [place] services in preparation for discharge; daughter chooses [facility] for [facility] needs.  Referral to Santiago Glad with [facility] for [facility] follow up; start of care 24-48h post dc date.  RW and BSC ordered; will notify [facility] DME reps when discharge date is known.  Will follow progress.  Corinna Gab, RN, BSN  08/23/16 J. Queena Monrreal, RN, BSN  Pt evaluated by [employer] and determined not at a level to tolerate intense level of therapy required for inpatient rehab.  Pt will need SNF for rehab.  CSW made aware; plan to facilitate dc to SNF upon medical stability.     Reinaldo Raddle, RN, BSN  Trauma/Neuro ICU Case Manager [phone]

## 2016-08-23 NOTE — Clinical Social Work Note (Signed)
Clinical Social Worker continuing to follow patient and family for support and discharge planning needs.  CSW spoke with patient daughter at bedside to discuss available bed offers.  Patient family can no longer provide 24/7 support at discharge and therefore CIR has denied patient admission.  Patient family agreeable to SNF placement, however patient with medical decline, requiring face mask oxygen.  Patient family provided with written bed offers, and currently leaning towards [facility].  CSW has notified facility and will follow up to facilitate patient discharge needs.  Barbette Or, [place]

## 2016-08-23 NOTE — Progress Notes (Addendum)
Subjective: No n/v. Feels a little full. Had BM - nonbloody. PT recommending inpt rehab  Objective: Vital signs in last 24 hours: Temp:  [98.2 F (36.8 C)-99.2 F (37.3 C)] 98.2 F (36.8 C) (03/13 0424) Pulse Rate:  [59-71] 59 (03/13 0424) Resp:  [20-26] 22 (03/13 0424) BP: (105-133)/(52-62) 133/62 (03/13 0424) SpO2:  [94 %-100 %] 96 % (03/13 0424) Weight:  [87.6 kg (193 lb 2 oz)] 87.6 kg (193 lb 2 oz) (03/13 0424) Last BM Date: 08/22/16  Intake/Output from previous day: 03/12 0701 - 03/13 0700 In: 2295 [P.O.:720; I.V.:1575] Out: 550 [Urine:550] Intake/Output this shift: No intake/output data recorded.  Awake, alert, nad cta ant Reg Full abd, nontender, +BS No edema  Lab Results:   Recent Labs  08/22/16 1332 08/23/16 0334  WBC 4.4 4.2  HGB 9.9* 8.5*  HCT 31.3* 26.6*  PLT 191 192   BMET  Recent Labs  08/22/16 0322 08/23/16 0334  NA 138 139  K 4.2 4.3  CL 111 112*  CO2 23 22  GLUCOSE 123* 93  BUN 19 13  CREATININE 1.15 0.97  CALCIUM 7.6* 7.7*   PT/INR No results for input(s): LABPROT, INR in the last 72 hours. ABG No results for input(s): PHART, HCO3 in the last 72 hours.  Invalid input(s): PCO2, PO2  Studies/Results: Ct Abdomen Pelvis Wo Contrast  Result Date: 08/21/2016 CLINICAL DATA:  RECENT TRAUMA. FELL FROM TREE. RIGHT-SIDED RIB FRACTURES AND PNEUMOTHORAX. EXAM: CT ABDOMEN AND PELVIS WITHOUT CONTRAST TECHNIQUE: Multidetector CT imaging of the abdomen and pelvis was performed following the standard protocol without IV contrast. COMPARISON:  CT of the abdomen and pelvis 08/13/2016. Abdominal radiographs 08/21/2016. FINDINGS: Lower chest: Bilateral pleural effusions are present, right greater than left. Pneumomediastinum is again seen. Dense subcutaneous emphysema coronary artery calcifications are evident. Subcutaneous emphysema is worse right than left. Hepatobiliary: Punctate calcifications are again noted in the liver. No focal liver lesion or  trauma is evident. The common bile duct and gallbladder are within normal limits. Pancreas: Unremarkable. No pancreatic ductal dilatation or surrounding inflammatory changes. Spleen: Normal in size without focal abnormality. Adrenals/Urinary Tract: The adrenal glands are normal bilaterally. Bilateral renal cysts are again noted, right greater than left. Ureters are within normal limits. The urinary bladder is unremarkable. Stomach/Bowel: Stomach and duodenum are within normal limits. Small bowel is unremarkable. Bowel is no longer herniated into the right inguinal canal. The appendix is visualized and normal. The ascending and transverse colon are normal. The descending and sigmoid colon are within normal limits. Vascular/Lymphatic: Atherosclerotic calcifications are again seen without aneurysm. There is no significant adenopathy. Reproductive: Prostate is unremarkable. Other: High-density fluid layering within the abdomen and pelvis compatible with pneumoperitoneum. No discrete source is identified. Blood extends into the right inguinal canal along with fat. Musculoskeletal: Multilevel degenerative changes are again noted in the lower lumbar spine. Anterior costochondral fractures are again seen. No new fractures are evident. IMPRESSION: 1. Pneumoperitoneum likely accounts for the drop in hemoglobin. 2. No discrete etiology of the pneumoperitoneum is evident. 3. Pneumomediastinum. 4. Extensive subcutaneous emphysema is most prominent at the right chest and extends into the right abdomen. 5. Large right inguinal hernia contains fat and fluid, likely blood. Bowel is no longer present within hernia. 6. Atherosclerosis. 7. Degenerative changes of the lower lumbar spine. 8. Anterior costochondral fractures on the right. These results were called by telephone at the time of interpretation on 08/21/2016 at 8:44 pm to Dr. Stark Klein, who verbally acknowledged these results. Electronically Signed  By: San Morelle  M.D.   On: 08/21/2016 20:53   Dg Abd 2 Views  Result Date: 08/21/2016 CLINICAL DATA:  Ileus EXAM: ABDOMEN - 2 VIEW COMPARISON:  08/21/2016 FINDINGS: Contrast material remains throughout the colon similar to that seen on the prior exam from a prior CT study. The nasogastric catheter has been removed in the interval. Right-sided pleural effusion is again identified. No obstructive changes are seen. No free air is noted. Degenerative changes of the lumbar spine are seen. IMPRESSION: No acute abnormality is noted aside from a right pleural effusion. Stable contrast throughout the colon. Electronically Signed   By: Inez Catalina M.D.   On: 08/21/2016 08:46    Anti-infectives: Anti-infectives    None      Assessment/Plan: 20-25 foot fall from tree Right PTX with subcutaneous emphysema- some pneumomediastinum still on CT Right sided costochondral fractures (4-6) sternum is intact Small anterior mediastinal hematoma Hemoperitoneum - no clear source, Hgb 8.5 this am same as yesterday AM - was a little higher last pm, abd exam seems stable, no fever, no tachycardia, no hypoTN; adv to fulls plus ensure for now Right ingunial hernia containing loops of small bowel and colonic diverticulosis- now reduced as seen on CT VTE- Lovenox on hold given hemoperitoneum, if hgb stable 3/14 am will resume, SCD FEN-  pt has 150cc IVF/hr -decrease IVF,encourage oral intake, speech therapy recommends regular solids followed by thin liquids  Disposition/Plan - needs 1 more day of observation; continue therapies, inpt Rehab MD to evaluate   I spoke with Dr. Ree Kida.  Leighton Ruff. Redmond Pulling, MD, FACS General, Bariatric, & Minimally Invasive Surgery [facility] Surgery, [place]   LOS: 10 days    Gayland Curry 08/23/2016

## 2016-08-23 NOTE — Progress Notes (Signed)
Physical Therapy Treatment Patient Details Name: [name] MRN: [id number] DOB: Aug 28, 1927 Today's Date: 08/23/2016    History of Present Illness Pt adm after 25 foot fall from a tree and suffered rt rib fx's and small rt pneumothorax. PMH - HTN, RA, rt rotator cuff problems, Hep A, and Depression.    PT Comments    Session limited today due to increased SOB and wheezing needing breathing treatment.  Mod cues needed for bed mobility, and decreased sitting balance noted today.  Continue to recommend CIR for d/c.   Follow Up Recommendations  CIR     Equipment Recommendations  Rolling walker with 5" wheels    Recommendations for Other Services       Precautions / Restrictions Precautions Precautions: Fall Restrictions Weight Bearing Restrictions: No    Mobility  Bed Mobility Overal bed mobility: Needs Assistance Bed Mobility: Supine to Sit;Rolling Rolling: Min assist   Supine to sit: Min assist Sit to supine: Supervision;HOB elevated (with bed rail)   General bed mobility comments: bed mobility and supine to sit performed x 2; pt needing mod cues for proper sequencing and technique  Transfers                 General transfer comment: deferred due to SOB  Ambulation/Gait             General Gait Details: deferred due to SOB   Stairs            Wheelchair Mobility    Modified Rankin (Stroke Patients Only)       Balance Overall balance assessment: Needs assistance Sitting-balance support: No upper extremity supported Sitting balance-Leahy Scale: Fair (pt restless sitting EOB)                              Cognition Arousal/Alertness: Awake/alert Behavior During Therapy: Restless Overall Cognitive Status: Within Functional Limits for tasks assessed                 General Comments: pt restless due to increased SOB, needed breathing tx during session due to wheezing and SOB    Exercises      General Comments        Pertinent Vitals/Pain Pain Assessment: No/denies pain    Home Living                      Prior Function            PT Goals (current goals can now be found in the care plan section) Acute Rehab PT Goals Patient Stated Goal: to return home PT Goal Formulation: With patient Time For Goal Achievement: 08/30/16 Potential to Achieve Goals: Good Progress towards PT goals: Progressing toward goals;Not progressing toward goals - comment (due to SOB)    Frequency    Min 3X/week      PT Plan Current plan remains appropriate    Co-evaluation     PT goals addressed during session: Mobility/safety with mobility;Balance       End of Session Equipment Utilized During Treatment: Oxygen Activity Tolerance: Treatment limited secondary to medical complications (Comment) Patient left: in bed;with call bell/phone within reach;with nursing/sitter in room Nurse Communication: Mobility status PT Visit Diagnosis: Unsteadiness on feet (R26.81) Pain - Right/Left: Right Pain - part of body:  (ribs)     Time: 8786-7672 PT Time Calculation (min) (ACUTE ONLY): 27 min  Charges:  $Therapeutic Activity: 23-37 mins  G Codes:          Laureen Abrahams, PT, DPT 08/23/16 8:42 AM [phone]

## 2016-08-23 NOTE — NC FL2 (Signed)
[place] MEDICAID FL2 LEVEL OF CARE SCREENING TOOL     IDENTIFICATION  Patient Name: [name] Birthdate: September 06, 1927 Sex: male Admission Date (Current Location): 08/13/2016  [place] [facility] and [place] Number:  Herbalist and Address:  [facility], [place] [address], [place], [place] [address]      Provider Number: [id number]  Attending Physician Name and Address:  Trauma Md, MD  Relative Name and Phone Number:       Current Level of Care: Hospital Recommended Level of Care: [place] Prior Approval Number:    Date Approved/Denied:   PASRR Number:    Discharge Plan: SNF    Current Diagnoses: Patient Active Problem List   Diagnosis Date Noted  . Abdominal distention   . Benign essential HTN   . Pneumoperitoneum   . Pain   . Acute blood loss anemia   . Tachypnea   . Hemoperitoneum   . Ileus ([place]) 08/20/2016  . Dysphagia 08/20/2016  . Chest pain 08/20/2016  . Pneumothorax 08/13/2016  . 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 Aortic stenosis 06/20/2014  . Chest tightness 06/20/2014  . Essential hypertension 06/20/2014  . Insomnia 06/20/2014  . Sinus bradycardia 06/20/2014  . Rectal bleeding 01/20/2014  . Aortic valve disorders 11/07/2013  . Rotator cuff syndrome of right shoulder 08/01/2013    Orientation RESPIRATION BLADDER Height & Weight     Self, Time, Situation, Place  O2 (2L) Continent Weight: 193 lb 2 oz (87.6 kg) Height:  5\' 9"  (175.3 cm)  BEHAVIORAL SYMPTOMS/MOOD NEUROLOGICAL BOWEL NUTRITION STATUS      Continent Diet (Full Liquid with plans to advance prior to discharge)  AMBULATORY STATUS COMMUNICATION OF NEEDS Skin   Limited Assist Verbally (Non English Speaking) Normal                       Personal Care Assistance Level of Assistance  Bathing, Feeding, Dressing Bathing Assistance: Limited assistance Feeding assistance: Independent Dressing Assistance: Limited  assistance     Functional Limitations Info  Sight, Hearing, Speech Sight Info: Adequate Hearing Info: Adequate Speech Info: Adequate    SPECIAL CARE FACTORS FREQUENCY  PT (By licensed PT), OT (By licensed OT)     PT Frequency: 3x week OT Frequency: 3x week            Contractures Contractures Info: Not present    Additional Factors Info  Code Status, Allergies, Psychotropic Code Status Info: Full Code Allergies Info: No Active Allergies Psychotropic Info: Lexapro         Current Medications (08/23/2016):  This is the current hospital active medication list Current Facility-Administered Medications  Medication Dose Route Frequency Provider Last Rate Last Dose  . 0.9 %  sodium chloride infusion   Intravenous Once [employer], DO      . aspirin EC tablet 81 mg  81 mg Oral Daily Rolm Bookbinder, MD   81 mg at 08/23/16 0856  . bisacodyl (DULCOLAX) suppository 10 mg  10 mg Rectal Daily PRN Maryann Mikhail, DO      . cyclobenzaprine (FLEXERIL) tablet 5 mg  5 mg Oral TID Judeth Horn, MD   5 mg at 08/23/16 0856  . docusate sodium (COLACE) capsule 100 mg  100 mg Oral BID Clovis Riley, MD   100 mg at 08/23/16 0856  . escitalopram (LEXAPRO) tablet 20 mg  20 mg Oral Daily Rolm Bookbinder, MD   20 mg  at 08/23/16 0858  . feeding supplement (ENSURE ENLIVE) (ENSURE ENLIVE) liquid 237 mL  237 mL Oral BID BM Teena Irani, MD   237 mL at 08/23/16 0855  . hydrochlorothiazide (MICROZIDE) capsule 12.5 mg  12.5 mg Oral Daily Rolm Bookbinder, MD   12.5 mg at 08/23/16 1000  . ibuprofen (ADVIL,MOTRIN) tablet 800 mg  800 mg Oral Q8H PRN Rolm Bookbinder, MD   800 mg at 08/20/16 1604  . ipratropium-albuterol (DUONEB) 0.5-2.5 (3) MG/3ML nebulizer solution 3 mL  3 mL Nebulization Q6H PRN Maryann Mikhail, DO   3 mL at 08/22/16 2204  . ipratropium-albuterol (DUONEB) 0.5-2.5 (3) MG/3ML nebulizer solution 3 mL  3 mL Nebulization Q4H Maryann Mikhail, DO   3 mL at 08/23/16 6967  . irbesartan  (AVAPRO) tablet 75 mg  75 mg Oral Daily Rolm Bookbinder, MD   75 mg at 08/23/16 0857  . ketorolac (TORADOL) 15 MG/ML injection 15 mg  15 mg Intravenous Q6H Erroll Luna, MD   15 mg at 08/23/16 0550  . magnesium hydroxide (MILK OF MAGNESIA) suspension 15 mL  15 mL Oral Daily PRN Clovis Riley, MD      . ondansetron [facility]) injection 4 mg  4 mg Intravenous Q6H PRN Rolm Bookbinder, MD   4 mg at 08/20/16 1605  . pantoprazole (PROTONIX) EC tablet 40 mg  40 mg Oral Daily Rolm Bookbinder, MD   40 mg at 08/23/16 1000  . polyethylene glycol (MIRALAX / GLYCOLAX) packet 17 g  17 g Oral Daily Kalman Drape, PA   17 g at 08/23/16 0855  . promethazine (PHENERGAN) injection 6.25 mg  6.25 mg Intravenous Q4H PRN Erroll Luna, MD   6.25 mg at 08/20/16 1759  . simvastatin (ZOCOR) tablet 20 mg  20 mg Oral QHS Rolm Bookbinder, MD   20 mg at 08/22/16 2145  . traMADol (ULTRAM) tablet 50 mg  50 mg Oral Q6H Georganna Skeans, MD   50 mg at 08/22/16 1817     Discharge Medications: Please see discharge summary for a list of discharge medications.  Relevant Imaging Results:  Relevant Lab Results:   Additional Information SSN [id number]   Barbette Or, [place]

## 2016-08-23 NOTE — Progress Notes (Signed)
I met with pt and a daughter from [place] at bedside.. Pt obviously confused and some SOB today. I discussed with daughter that pt not at a level to tolerate intense inpt rehab at this time and we project that pt will need 24/7 supervision at d/c from any rehab. Daughter states pt does not have that level of care at home and that he has not needed that in the past. I recommend SNF rehab at this time and have discussed with SW, Denyse Amass. [phone]

## 2016-08-23 NOTE — Consult Note (Signed)
Physical Medicine and Rehabilitation Consult Reason for Consult: Polytrauma after fall with multiple right rib fractures and right pneumothorax/right anterior mediastinal hematoma Referring Physician: Trauma services   HPI: [name] is a 83 y.o. right handed [place] speaking male with history of hypertension. Per chart review patient lives alone. His daughter lives in the home next to him. Patient reported to be independent prior to admission. Question of 24 hour assistance on discharge. Presented 08/13/2016 after a fall  from a tree while cutting branches. No report of loss of consciousness. Complaints of back pain. Cranial CT reviewed, unremarkable for acute process. CT cervical spine negative. CT of the chest abdomen and pelvis showed a right pneumothorax with right chest wall subcutaneous emphysema. Right costochondral fractures. Small anterior mediastinal hematoma. No evidence for great vessel injury or acute intra-abdominal injury. Conservative care of right pneumothorax. Decrease in hemoglobin with follow-up CT abdomen and pelvis showed pneumoperitoneum of which patient was transfused close monitoring of CBC with latest hemoglobin 8.5. Patient developed nausea abdominal bloating as well as reports of chronic dysphagia with gastroenterology consulted 08/20/2016. A modified barium swallow was completed and advised regular diet. A plain abdominal film suggested mild large and small bowel ileus and no signs of peritonitis. A nasogastric tube was placed for decompression and presently on a clear liquid diet.  Evaluated with Surg, who plans to advance diet today. Hospital course pain management. Physical occupational therapy evaluations completed. Recommendations for physical medicine rehabilitation consult.   Review of Systems  Unable to perform ROS: Language   Past Medical History:  Diagnosis Date  . At risk for sleep apnea    STOP-BANG= 4    SENT TO PCP 04-04-2014  . BPH (benign  prostatic hypertrophy)   . Depression   . Diverticulosis of colon   . GERD (gastroesophageal reflux disease)   . Gilbert's syndrome   . Headache    migraines  . Hearing loss   . Heart murmur    "Calcified aortic valve"  . Hepatitis    Hep A as a child  . History of gastroenteritis   . History of kidney stones   . History of syncope    2002--  vasovagal  . HOH (hard of hearing)    refuses to wear his Hearing Aid  . Hypertension   . Inguinal hernia    right  . Internal hemorrhoid, bleeding   . Moderate aortic stenosis    a. Last echo 11/2013, 06/2014.  Marland Kitchen Nocturia   . Normal coronary arteries    a. Cath 07/2010: normal coronaries. Felt to have noncardiac CP/SOB at that time possibly r/t anxiety.  . RA (rheumatoid arthritis) ([place])    bilateral hands/ wrist--  seronegative  . Rotator cuff syndrome of right shoulder 08/01/2013  . Shortness of breath dyspnea    with exertion  . Stone, kidney   . Thyroid cyst   . Thyroid nodule    noted 11-2009  . Wears dentures    Past Surgical History:  Procedure Laterality Date  . CARDIAC CATHETERIZATION  07-19-2010  dr Tressia Miners turner   normal coronary arteries,  ef 60%,  moderate aortic stenosis- gradient 44mmHg, normal right heart pressure  . CARDIOVASCULAR STRESS TEST  05/ 2011   dr Marlou Porch   normal low risk perfusion study  . CATARACT EXTRACTION W/ INTRAOCULAR LENS  IMPLANT, BILATERAL  2013  . COLONOSCOPY  2010  approx  . THYROID LOBECTOMY Right 05/05/2015   Procedure: RIGHT THYROID LOBECTOMY;  Surgeon: Armandina Gemma, MD;  Location: [facility];  Service: General;  Laterality: Right;  . TRANSANAL HEMORRHOIDAL DEARTERIALIZATION N/A 04/09/2014   Procedure: TRANSANAL HEMORRHOIDAL DEARTERIALIZATION OF INTERNAL HEMORROIDS;  Surgeon: Leighton Ruff, MD;  Location: [facility];  Service: General;  Laterality: N/A;  . TRANSTHORACIC ECHOCARDIOGRAM  11-26-2013   moderate focal basal and mild LVH/  ef 94-85%/  grade I diastolic dysfunction/ mild  LAE/  moderate calcification with stenosis AV with mild regurg,  gradients 35 abd 58 mmHg /  mild TR   Family History  Problem Relation Age of Onset  . Pulmonary embolism Mother 6    caused by complications of surgery  . CVA Father   . Diabetes Brother   . Diabetes Brother   . Breast cancer Sister    Social History:  reports that he quit smoking about 35 years ago. His smoking use included Cigarettes. He started smoking about 43 years ago. He quit after 40.00 years of use. He has never used smokeless tobacco. He reports that he drinks alcohol. He reports that he does not use drugs. Allergies: No Active Allergies Medications Prior to Admission  Medication Sig Dispense Refill  . aspirin EC 81 MG tablet Take 81 mg by mouth daily.    Marland Kitchen DIOVAN 80 MG tablet Take 80 mg by mouth every morning.     . escitalopram (LEXAPRO) 20 MG tablet Take 20 mg by mouth daily.   4  . folic acid (FOLVITE) 1 MG tablet Take 1 mg by mouth daily.   0  . hydrochlorothiazide (MICROZIDE) 12.5 MG capsule Take 12.5 mg by mouth daily.    Marland Kitchen HYDROcodone-acetaminophen (NORCO/VICODIN) 5-325 MG tablet Take 1-2 tablets by mouth every 4 (four) hours as needed for moderate pain. 20 tablet 0  . ibuprofen (ADVIL,MOTRIN) 800 MG tablet Take 800 mg by mouth every 8 (eight) hours as needed for moderate pain.     . methotrexate (RHEUMATREX) 2.5 MG tablet Take 22.5 mg by mouth once a week. TAKE ON SUNDAYS    . omeprazole (PRILOSEC) 20 MG capsule TAKE 1 CAPSULE(20 MG) BY MOUTH DAILY 15 capsule 0  . simvastatin (ZOCOR) 20 MG tablet TAKE 1 TABLET BY MOUTH EVERY NIGHT AT BEDTIME 30 tablet 0  . dutasteride (AVODART) 0.5 MG capsule Take 0.5 mg by mouth daily.  0    Home: Home Living Family/patient expects to be discharged to:: Private residence Living Arrangements: Children (daughter who works most of time in [place]) Available Help at Discharge: Family, Available 24 hours/day (until 3/17 and then intermittently) Type of Home: House Home  Access: Stairs to enter [employer] of Steps: 2 Home Layout: One level Bathroom Shower/Tub: Chiropodist: Handicapped height Home Equipment: Grab bars - tub/shower  Functional History: Prior Function Level of Independence: Independent Comments: drives Functional Status:  Mobility: Bed Mobility Overal bed mobility: Needs Assistance Bed Mobility: Supine to Sit Supine to sit: Min assist Sit to supine: Min assist General bed mobility comments: Assist to elevate trunk into sitting and bring hips to EOB Transfers Overall transfer level: Needs assistance Equipment used: Rolling walker (2 wheeled) Transfers: Sit to/from Stand Sit to Stand: Min guard Stand pivot transfers: Min guard General transfer comment: Assist for balance and safety Ambulation/Gait Ambulation/Gait assistance: Min guard Ambulation Distance (Feet): 170 Feet Assistive device: Rolling walker (2 wheeled) Gait Pattern/deviations: Step-through pattern, Decreased stride length, Drifts right/left General Gait Details: Assist for balance and safety. Unable to get SpO2 reading during or after amb. SpO2  prior to amb 98% on RA. Amb on RA.  Gait velocity: decr Gait velocity interpretation: Below normal speed for age/gender    ADL: ADL Overall ADL's : Needs assistance/impaired Eating/Feeding: Independent, Sitting Grooming: Wash/dry hands, Wash/dry face, Oral care, Supervision/safety, Standing Grooming Details (indicate cue type and reason): Pt stood at sink for appx 6 minutes with S. Upper Body Bathing: Set up, Sitting Lower Body Bathing: Moderate assistance, Sit to/from stand Lower Body Bathing Details (indicate cue type and reason): followed up on use of long sponge Upper Body Dressing : Minimal assistance, Sitting Upper Body Dressing Details (indicate cue type and reason): front opening gown Lower Body Dressing: Sit to/from stand, Minimal assistance Lower Body Dressing Details (indicate  cue type and reason): practiced use of sock aide and demonstrated use of reacher for pants, removing sock Toilet Transfer: Min guard, RW, Cueing for safety Toilet Transfer Details (indicate cue type and reason): pt walked to bathroom. cues to reach back when sitting and to keep walker close by. Toileting- Water quality scientist and Hygiene: Min guard, Sit to/from stand, Cueing for safety Toileting - Clothing Manipulation Details (indicate cue type and reason): cues to not get up alone. Do not leave in bathroom alone; pt will get up. Functional mobility during ADLs: Minimal assistance, Rolling walker, Cueing for sequencing, Cueing for safety General ADL Comments: Educated family in safe techniques and need for someone to be with pt at all times at this point.  Cognition: Cognition Overall Cognitive Status: Within Functional Limits for tasks assessed Orientation Level: Oriented X4 Cognition Arousal/Alertness: Awake/alert Behavior During Therapy: WFL for tasks assessed/performed Overall Cognitive Status: Within Functional Limits for tasks assessed  Blood pressure 133/62, pulse (!) 59, temperature 98.2 F (36.8 C), temperature source Oral, resp. rate (!) 22, height 5\' 9"  (1.753 m), weight 87.6 kg (193 lb 2 oz), SpO2 96 %. Physical Exam  Vitals reviewed. Constitutional: He appears well-developed.  Obese  HENT:  Head: Normocephalic.  Right Ear: External ear normal.  Left Ear: External ear normal.  Eyes: Conjunctivae and EOM are normal.  Neck: Normal range of motion. Neck supple. No thyromegaly present.  Cardiovascular: Normal rate and regular rhythm.   Respiratory: He has wheezes.  Increased WOB  GI:  Abdomen mildly distended. Nontender with diminished bowel sounds  Musculoskeletal: He exhibits no edema or tenderness.  Neurological: He is alert.  Exam limited by language barrier.  He is able to follow basic commands Motor: B/l UE, RLE 5/5 proximal to distal LLE: HF 4+/5, distally  5/5 Sensation appears to be intact to light touch  Skin: Skin is warm and dry.  Psychiatric:  Mood and behavior appear to be normal    Results for orders placed or performed during the hospital encounter of 08/13/16 (from the past 24 hour(s))  CBC     Status: Abnormal   Collection Time: 08/22/16  1:32 PM  Result Value Ref Range   WBC 4.4 4.0 - 10.5 K/uL   RBC 3.50 (L) 4.22 - 5.81 MIL/uL   Hemoglobin 9.9 (L) 13.0 - 17.0 g/dL   HCT 31.3 (L) 39.0 - 52.0 %   MCV 89.4 78.0 - 100.0 fL   MCH 28.3 26.0 - 34.0 pg   MCHC 31.6 30.0 - 36.0 g/dL   RDW 15.8 (H) 11.5 - 15.5 %   Platelets 191 150 - 400 K/uL  CBC     Status: Abnormal   Collection Time: 08/23/16  3:34 AM  Result Value Ref Range   WBC 4.2 4.0 -  10.5 K/uL   RBC 2.97 (L) 4.22 - 5.81 MIL/uL   Hemoglobin 8.5 (L) 13.0 - 17.0 g/dL   HCT 26.6 (L) 39.0 - 52.0 %   MCV 89.6 78.0 - 100.0 fL   MCH 28.6 26.0 - 34.0 pg   MCHC 32.0 30.0 - 36.0 g/dL   RDW 15.6 (H) 11.5 - 15.5 %   Platelets 192 150 - 400 K/uL  Basic metabolic panel     Status: Abnormal   Collection Time: 08/23/16  3:34 AM  Result Value Ref Range   Sodium 139 135 - 145 mmol/L   Potassium 4.3 3.5 - 5.1 mmol/L   Chloride 112 (H) 101 - 111 mmol/L   CO2 22 22 - 32 mmol/L   Glucose, Bld 93 65 - 99 mg/dL   BUN 13 6 - 20 mg/dL   Creatinine, Ser 0.97 0.61 - 1.24 mg/dL   Calcium 7.7 (L) 8.9 - 10.3 mg/dL   GFR calc non Af Amer >60 >60 mL/min   GFR calc Af Amer >60 >60 mL/min   Anion gap 5 5 - 15   Ct Abdomen Pelvis Wo Contrast  Result Date: 08/21/2016 CLINICAL DATA:  RECENT TRAUMA. FELL FROM TREE. RIGHT-SIDED RIB FRACTURES AND PNEUMOTHORAX. EXAM: CT ABDOMEN AND PELVIS WITHOUT CONTRAST TECHNIQUE: Multidetector CT imaging of the abdomen and pelvis was performed following the standard protocol without IV contrast. COMPARISON:  CT of the abdomen and pelvis 08/13/2016. Abdominal radiographs 08/21/2016. FINDINGS: Lower chest: Bilateral pleural effusions are present, right greater than  left. Pneumomediastinum is again seen. Dense subcutaneous emphysema coronary artery calcifications are evident. Subcutaneous emphysema is worse right than left. Hepatobiliary: Punctate calcifications are again noted in the liver. No focal liver lesion or trauma is evident. The common bile duct and gallbladder are within normal limits. Pancreas: Unremarkable. No pancreatic ductal dilatation or surrounding inflammatory changes. Spleen: Normal in size without focal abnormality. Adrenals/Urinary Tract: The adrenal glands are normal bilaterally. Bilateral renal cysts are again noted, right greater than left. Ureters are within normal limits. The urinary bladder is unremarkable. Stomach/Bowel: Stomach and duodenum are within normal limits. Small bowel is unremarkable. Bowel is no longer herniated into the right inguinal canal. The appendix is visualized and normal. The ascending and transverse colon are normal. The descending and sigmoid colon are within normal limits. Vascular/Lymphatic: Atherosclerotic calcifications are again seen without aneurysm. There is no significant adenopathy. Reproductive: Prostate is unremarkable. Other: High-density fluid layering within the abdomen and pelvis compatible with pneumoperitoneum. No discrete source is identified. Blood extends into the right inguinal canal along with fat. Musculoskeletal: Multilevel degenerative changes are again noted in the lower lumbar spine. Anterior costochondral fractures are again seen. No new fractures are evident. IMPRESSION: 1. Pneumoperitoneum likely accounts for the drop in hemoglobin. 2. No discrete etiology of the pneumoperitoneum is evident. 3. Pneumomediastinum. 4. Extensive subcutaneous emphysema is most prominent at the right chest and extends into the right abdomen. 5. Large right inguinal hernia contains fat and fluid, likely blood. Bowel is no longer present within hernia. 6. Atherosclerosis. 7. Degenerative changes of the lower lumbar  spine. 8. Anterior costochondral fractures on the right. These results were called by telephone at the time of interpretation on 08/21/2016 at 8:44 pm to Dr. Stark Klein, who verbally acknowledged these results. Electronically Signed   By: San Morelle M.D.   On: 08/21/2016 20:53   Dg Abd 2 Views  Result Date: 08/21/2016 CLINICAL DATA:  Ileus EXAM: ABDOMEN - 2 VIEW COMPARISON:  08/21/2016 FINDINGS:  Contrast material remains throughout the colon similar to that seen on the prior exam from a prior CT study. The nasogastric catheter has been removed in the interval. Right-sided pleural effusion is again identified. No obstructive changes are seen. No free air is noted. Degenerative changes of the lumbar spine are seen. IMPRESSION: No acute abnormality is noted aside from a right pleural effusion. Stable contrast throughout the colon. Electronically Signed   By: Inez Catalina M.D.   On: 08/21/2016 08:46    Assessment/Plan: Diagnosis: Polytrauma  Labs and images independently reviewed.  Records reviewed and summated above.  1. Does the need for close, 24 hr/day medical supervision in concert with the patient's rehab needs make it unreasonable for this patient to be served in a less intensive setting? Potentially 2. Co-Morbidities requiring supervision/potential complications: HTN (monitor and provide prns in accordance with increased physical exertion and pain), right pneumothorax with right chest wall subcutaneous emphysema, pneumohemoperitoneum (monitor), dysphagia (cont to monitor), pain management (Biofeedback training with therapies to help reduce reliance on opiate pain medications and other IV meds, particularly IV toradol, monitor pain control during therapies, and sedation at rest and titrate to maximum efficacy to ensure participation and gains in therapies), tachypnea (monitor RR and O2 Sats with increased physical exertion), ABLA (transfuse if necessary to ensure appropriate perfusion for  increased activity tolerance) 3. Due to bowel management, safety, disease management, pain management and patient education, does the patient require 24 hr/day rehab nursing? Yes 4. Does the patient require coordinated care of a physician, rehab nurse, PT (1-2 hrs/day, 5 days/week) and OT (1-2 hrs/day, 5 days/week) to address physical and functional deficits in the context of the above medical diagnosis(es)? Yes Addressing deficits in the following areas: balance, endurance, locomotion, transferring, toileting and psychosocial support 5. Can the patient actively participate in an intensive therapy program of at least 3 hrs of therapy per day at least 5 days per week? Potentially 6. The potential for patient to make measurable gains while on inpatient rehab is good 7. Anticipated functional outcomes upon discharge from inpatient rehab are modified independent and supervision  with PT, modified independent and supervision with OT, n/a with SLP. 8. Estimated rehab length of stay to reach the above functional goals is: 12-16 days. 9. Does the patient have adequate social supports and living environment to accommodate these discharge functional goals? Potentially 10. Anticipated D/C setting: Home 11. Anticipated post D/C treatments: HH therapy and Home excercise program 12. Overall Rehab/Functional Prognosis: excellent  RECOMMENDATIONS: This patient's condition is appropriate for continued rehabilitative care in the following setting: Currently pt unable to tolerate 3 hours therapy/day with SOB at rest.  Further, vased on therapies, it appears pt will need some level of assistance/supervision at discharge.  If this is available recommend CIR if/when patient able to tolerate. Patient has agreed to participate in recommended program. Potentially Note that insurance prior authorization may be required for reimbursement for recommended care.  Comment: Rehab Admissions Coordinator to follow up.  Delice Lesch, MD, Mellody Drown Cathlyn Parsons., PA-C 08/23/2016

## 2016-08-23 NOTE — Clinical Social Work Note (Signed)
Clinical Social Work Assessment  Patient Details  Name: Desman Polak MRN: [id number] Date of Birth: January 11, 1928  Date of referral:  08/22/16               Reason for consult:  Trauma                Permission sought to share information with:  Family Supports Permission granted to share information::  Yes, Verbal Permission Granted  Name::     Clint Bolder  Relationship::  Daughter  Contact Information:  [phone]  Housing/Transportation Living arrangements for the past 2 months:  [place] of Information:  Other (Comment Required) (Granddaughter) Patient Interpreter Needed:  Other (Comment Required) (Patient family interpreting for patient - release has been addressed) Criminal Activity/Legal Involvement Pertinent to Current Situation/Hospitalization:  No - Comment as needed Significant Relationships:  Adult Children, Other Family Members Lives with:  Self Do you feel safe going back to the place where you live?  Yes Need for family participation in patient care:  Yes (Comment)  Care giving concerns:  Patient granddaughter expresses concerns regarding patient immediate return home and the need for rehab to bridge patient path to home.  Patient family supportive and available to provide necessary and adequate support.   Social Worker assessment / plan:  Holiday representative met with patient granddaughter at bedside to offer support and discuss patient needs at discharge.  Patient granddaughter states that patient daughter plans to stay with patient at discharge intermittently, however patient family feels that he could benefit from some rehab prior to return home.  Patient currently lives alone and was extremely independent prior to hospitalization.  Patient family interested to explore the possibilities of inpatient rehab and SNF placement prior to return home.  CSW to initiate SNF search and follow up with available bed offers while patient is being assessed for  possible inpatient rehab admission.  CSW remains available for support and to facilitate patient discharge needs once medically stable.  No concerns with current substance use.  SBIRT completed and no resources necessary at this time.  Employment status:  Retired Health visitor PT Recommendations:  Inpatient [place], [place], [place] / Referral to community resources:  [place]  Patient/Family's Response to care:  Patient and family verbalize understanding of CSW role and appreciation for support and concern.  Patient and family agreeable to CIR and/or SNF placement at discharge.  Patient/Family's Understanding of and Emotional Response to Diagnosis, Current Treatment, and Prognosis:  Patient family appropriate regarding patient need for additional rehab and support prior to discharge home.  Patient family hopeful for full recovery, as patient enjoys his independence.  Emotional Assessment Appearance:  Appears younger than stated age Attitude/Demeanor/Rapport:  Unable to Assess Affect (typically observed):  Unable to Assess Orientation:  Oriented to Self, Oriented to Place, Oriented to  Time, Oriented to Situation Alcohol / Substance use:  Never Used Psych involvement (Current and /or in the community):  No (Comment)  Discharge Needs  Concerns to be addressed:  Discharge Planning Concerns Readmission within the last 30 days:  No Current discharge risk:  Physical Impairment Barriers to Discharge:  Continued Medical Work up  [employer], [place]

## 2016-08-24 ENCOUNTER — Inpatient Hospital Stay (HOSPITAL_COMMUNITY): Payer: Medicare Other

## 2016-08-24 LAB — CBC
HCT: 29.3 % — ABNORMAL LOW (ref 39.0–52.0)
Hemoglobin: 9.4 g/dL — ABNORMAL LOW (ref 13.0–17.0)
MCH: 28.7 pg (ref 26.0–34.0)
MCHC: 32.1 g/dL (ref 30.0–36.0)
MCV: 89.6 fL (ref 78.0–100.0)
Platelets: 218 10*3/uL (ref 150–400)
RBC: 3.27 MIL/uL — ABNORMAL LOW (ref 4.22–5.81)
RDW: 15.1 % (ref 11.5–15.5)
WBC: 5.8 10*3/uL (ref 4.0–10.5)

## 2016-08-24 LAB — GRAM STAIN

## 2016-08-24 MED ORDER — MENTHOL 3 MG MT LOZG
1.0000 | LOZENGE | OROMUCOSAL | Status: DC | PRN
  Filled 2016-08-24: qty 9

## 2016-08-24 MED ORDER — IPRATROPIUM-ALBUTEROL 0.5-2.5 (3) MG/3ML IN SOLN
3.0000 mL | Freq: Three times a day (TID) | RESPIRATORY_TRACT | Status: DC
  Administered 2016-08-24 (×2): 3 mL via RESPIRATORY_TRACT
  Filled 2016-08-24 (×3): qty 3

## 2016-08-24 NOTE — Progress Notes (Addendum)
PROGRESS NOTE   [name]  SHF:[id number] DOB: 26-Jul-1927 DOA: 08/13/2016   PCP: [name], MD   Chief Complaint  Patient presents with  . Trauma    Brief Narrative:  TR[facility] consulted for medical management.   Assessment & Plan   Anemia - ?Blood loss, internal bleeding - CT abd/pelvis listed below - FOBT negative - Anemia panel requested  - Hemoglobin on admission 13, dropped to 7 - Transfused 2u PRBCs 3/11 - Hemoglobin trend: 9.9 --> 8.5 --> 9.4 - CT abd/pelvis showed hemoperitoneum (Dr Ree Kida spoke with Dr. Grandville Silos, patient possibly has a slow bleed, however, since his abd exam has improved, continue to monitor CBC closely)  Acute kidney injury - likely secondary to anemia and poor oral intake - Creatinine peaked to 1.87, now WNL - BMP In AM  Esophageal dysphagia and ileus - CT scan showed normal-appearing stomach, small bowel as well as colon and pneumoperitoneum - Occult blood negative - GI signed off on 3/12 as no further recommendations   Right pneumothorax - 5% of lung volume, present on admission - Continue nebs and incentive spirometry, pulm toilet  - Noted on CT abd/pelvis, pneumomediastinum - right thoracentesis requested, will ask for CT chest as well for clearer evaluation   Elevated troponin/Transient diaphoresis today, w/ diffuse pains - felt multifactorial, including pain, ileus, anemia, demand ischemia - Troponin mildly elevated, but flat - Echocardiogram showed EF 50-27%, grade 1 diastolic dysfunction  Dyspnea/?Acute diastolic heart failure - Patient was receiving IVF, discontinued - Given dose of IV lasix - repeat CXR with worsening R pleural effusion, will ask for CT chest for clearer evaluation   Hypertension, essential  - SBP in 140's this AM - continue HCTZ and irbesartan   Hyperlipidemina - continue asa and statin  GERD - Continue PPI  Depression - Continue On lexapro  Deconditioning/Fall - SNF recommended and  family/pt in agreement  - pt however, not ready for discharge   DVT Prophylaxis  Discontinued lovenox given anemia, SCDs  Code Status: Full  Family Communication: no family at bedside this AM  Disposition Plan: SNF once pt more medically stable   Consultants  [facility]  Cardiology  Gastroenterology - signed off 3/12  CIR - signed off as SNF was recommended   Antibiotics   Anti-infectives    None      Subjective:   Feels short of breath today.   Objective:   Vitals:   08/24/16 0753 08/24/16 0920 08/24/16 1120 08/24/16 1203  BP: (!) 143/66   (!) 148/69  Pulse: 64  80   Resp: 17  (!) 21   Temp: 98.2 F (36.8 C)   97.4 F (36.3 C)  TempSrc: Axillary   Oral  SpO2: 100% 99% 95%   Weight:      Height:        Intake/Output Summary (Last 24 hours) at 08/24/16 1347 Last data filed at 08/24/16 0845  Gross per 24 hour  Intake              300 ml  Output             1950 ml  Net            -1650 ml   Filed Weights   08/21/16 0351 08/22/16 0525 08/23/16 0424  Weight: 79 kg (174 lb 2.6 oz) 84.1 kg (185 lb 6.5 oz) 87.6 kg (193 lb 2 oz)    Exam  General: Well developed, well nourished,   HEENT: NCAT, mucous  membranes moist.   Cardiovascular: S1 S2 auscultated,RRR  MSK chest wall TTP   Respiratory: Diminished breath sounds, exp wheezing, crackles at bases worse on the right side  Abdomen: Soft, nontender, distended, + bowel sounds  Extremities: warm dry without cyanosis clubbing or edema  Neuro: AAOx3, nonfocal  Psych: Normal affect and demeanor  Data Reviewed: I have personally reviewed following labs and imaging studies  CBC:  Recent Labs Lab 08/21/16 0313  08/21/16 2339 08/22/16 0322 08/22/16 1332 08/23/16 0334 08/24/16 0326  WBC 6.8  --   --  4.3 4.4 4.2 5.8  HGB 7.2*  < > 9.6* 8.4* 9.9* 8.5* 9.4*  HCT 22.3*  < > 28.8* 25.8* 31.3* 26.6* 29.3*  MCV 91.0  --   --  88.4 89.4 89.6 89.6  PLT 234  --   --  179 191 192 218  < > = values in this  interval not displayed. Basic Metabolic Panel:  Recent Labs Lab 08/18/16 0342 08/20/16 0513 08/20/16 1026 08/21/16 0313 08/21/16 0844 08/22/16 0322 08/23/16 0334  NA 140  --  137 137  --  138 139  K 4.6  --  4.2 4.8  --  4.2 4.3  CL 107  --  104 106  --  111 112*  CO2 27  --  23 25  --  23 22  GLUCOSE 98  --  142* 162*  --  123* 93  BUN 20  --  13 27*  --  19 13  CREATININE 1.03 1.00 1.06 1.87*  --  1.15 0.97  CALCIUM 8.4*  --  8.3* 8.1*  --  7.6* 7.7*  MG  --   --   --   --  2.1  --   --    Liver Function Tests:  Recent Labs Lab 08/20/16 1026 08/21/16 0313  AST 42* 39  ALT 39 34  ALKPHOS 54 50  BILITOT 2.1* 1.4*  PROT 5.9* 5.3*  ALBUMIN 3.0* 2.7*    Recent Labs Lab 08/20/16 1026  LIPASE 14   Cardiac Enzymes:  Recent Labs Lab 08/20/16 1026 08/20/16 1458 08/20/16 1905 08/21/16 0313 08/21/16 0511  TROPONINI 0.05* 0.13* 0.14* 0.10* 0.10*   Urine analysis:    Component Value Date/Time   COLORURINE YELLOW 08/13/2016 1820   APPEARANCEUR CLEAR 08/13/2016 1820   LABSPEC 1.038 (H) 08/13/2016 1820   PHURINE 5.0 08/13/2016 1820   GLUCOSEU NEGATIVE 08/13/2016 1820   HGBUR LARGE (A) 08/13/2016 1820   BILIRUBINUR NEGATIVE 08/13/2016 1820   KETONESUR 20 (A) 08/13/2016 1820   PROTEINUR NEGATIVE 08/13/2016 1820   UROBILINOGEN 1.0 04/14/2014 0116   NITRITE NEGATIVE 08/13/2016 1820   LEUKOCYTESUR NEGATIVE 08/13/2016 1820   Radiology Studies: Dg Chest 2 View  Result Date: 08/23/2016 CLINICAL DATA:  Shortness of Breath EXAM: CHEST  2 VIEW COMPARISON:  08/20/2016 FINDINGS: Subcutaneous emphysema again noted in the right chest wall. No visible pneumothorax. Continued small to moderate right effusion with right lung airspace disease. No confluent opacity on the left. Heart is upper limits normal in size. IMPRESSION: Small moderate right pleural effusion with right lung airspace disease, slightly increasing since prior study. Stable right chest wall subcutaneous  emphysema.  No pneumothorax. Electronically Signed   By: Rolm Baptise M.D.   On: 08/23/2016 12:36   Scheduled Meds: . sodium chloride   Intravenous Once  . aspirin EC  81 mg Oral Daily  . cyclobenzaprine  5 mg Oral TID  . docusate sodium  100 mg Oral  BID  . escitalopram  20 mg Oral Daily  . feeding supplement (ENSURE ENLIVE)  237 mL Oral BID BM  . hydrochlorothiazide  12.5 mg Oral Daily  . ipratropium-albuterol  3 mL Nebulization TID  . irbesartan  75 mg Oral Daily  . ketorolac  15 mg Intravenous Q6H  . pantoprazole  40 mg Oral Daily  . polyethylene glycol  17 g Oral Daily  . simvastatin  20 mg Oral QHS  . traMADol  50 mg Oral Q6H   Continuous Infusions:    LOS: 11 days   Time Spent in minutes   30 minutes  Romilda Joy, Emeril Stille MD on 08/24/2016 at 1:47 PM  Between 7am to 7pm - Pager - [phone]  After 7pm go to [email or web address] - password [email or web address]  And look for the night coverage person covering for me after hours  [facility] Office  [phone]

## 2016-08-24 NOTE — Progress Notes (Addendum)
Pt has been able to wean off oxygen to Room air. Currently resting and oxygen sats 95-100%. Denies any sob or discomfort. Encouraging use of incentive spirometer, requires frequent reminder of IS use. Will continue to monitor patient.

## 2016-08-24 NOTE — Progress Notes (Addendum)
RT weaned pts oxygen to RA - sats stable. IS done with pt. RT instructed pt to do IS Q1hr wa and splint the right side with the pillow when coughing. BBS clear with clrdim on right lower base. Family member at the bedside.

## 2016-08-24 NOTE — Progress Notes (Signed)
Family called to see if pt may need med's in MRI. Daughter states that pt does not do well with medications so to place ear phones and cover eyes. Message is relayed to MRI

## 2016-08-24 NOTE — Progress Notes (Signed)
Pt left floor for MRI

## 2016-08-24 NOTE — Progress Notes (Signed)
Pt back for IR. O2 via [place] 2L placed for comfort.

## 2016-08-24 NOTE — Progress Notes (Signed)
Trauma Service Note  Subjective: Patient sitting up in a chair, no apparent distress, but his daughter told me that he had a terrible day yesterday.    Objective: Vital signs in last 24 hours: Temp:  [97.9 F (36.6 C)-99.4 F (37.4 C)] 98.2 F (36.8 C) (03/14 0753) Pulse Rate:  [64-92] 64 (03/14 0753) Resp:  [15-33] 17 (03/14 0753) BP: (140-145)/(59-66) 143/66 (03/14 0753) SpO2:  [85 %-100 %] 99 % (03/14 0920) FiO2 (%):  [40 %] 40 % (03/13 1635) Last BM Date: 08/23/16  Intake/Output from previous day: 03/13 0701 - 03/14 0700 In: 300 [P.O.:300] Out: 1950 [Urine:1950] Intake/Output this shift: Total I/O In: 120 [P.O.:120] Out: -   General: No distress, but just looks uncomfortable  Lungs: Diminished breath sounds on the right at the bases.  Oxygen saturations are good at est but wheezes heavily and desaturates with exertion.  Abd: Distended, hypoactive bowel sounds.  Not tender.  No large bowel movement.  CT scan showed some free fluid/blood.  Patient also has a large right pleural effusion that I believe may help him considerably if it is drained.  Extremities: No changes  Neuro: Intact  Lab Results: CBC   Recent Labs  08/23/16 0334 08/24/16 0326  WBC 4.2 5.8  HGB 8.5* 9.4*  HCT 26.6* 29.3*  PLT 192 218   BMET  Recent Labs  08/22/16 0322 08/23/16 0334  NA 138 139  K 4.2 4.3  CL 111 112*  CO2 23 22  GLUCOSE 123* 93  BUN 19 13  CREATININE 1.15 0.97  CALCIUM 7.6* 7.7*   PT/INR No results for input(s): LABPROT, INR in the last 72 hours. ABG No results for input(s): PHART, HCO3 in the last 72 hours.  Invalid input(s): PCO2, PO2  Studies/Results: Dg Chest 2 View  Result Date: 08/23/2016 CLINICAL DATA:  Shortness of Breath EXAM: CHEST  2 VIEW COMPARISON:  08/20/2016 FINDINGS: Subcutaneous emphysema again noted in the right chest wall. No visible pneumothorax. Continued small to moderate right effusion with right lung airspace disease. No confluent  opacity on the left. Heart is upper limits normal in size. IMPRESSION: Small moderate right pleural effusion with right lung airspace disease, slightly increasing since prior study. Stable right chest wall subcutaneous emphysema.  No pneumothorax. Electronically Signed   By: Rolm Baptise M.D.   On: 08/23/2016 12:36    Anti-infectives: Anti-infectives    None      Assessment/Plan: s/p  US guided right thoracentesis  LOS: 11 days   Kathryne Eriksson. Dahlia Bailiff, MD, FACS [phone] Trauma Surgeon 08/24/2016

## 2016-08-24 NOTE — Procedures (Signed)
PROCEDURE SUMMARY:  Successful US guided right diagnostic and therapeutic thoracentesis. Yielded 900 mL of bloody fluid. Pt tolerated procedure well. No immediate complications. Procedure was stopped prior to removal of all fluid due to patient discomfort.   Specimen was sent for labs. CXR ordered.  Docia Barrier PA-C 08/24/2016 5:21 PM

## 2016-08-24 NOTE — Progress Notes (Signed)
Daughter called concerned about pt's gum implants to pt's mouth may be metal. MRI notified and states that implants were not a concern.

## 2016-08-24 NOTE — Progress Notes (Signed)
Dr. Ilda Foil called stating that pt's T10 fractured is more unstable. Dr. Hulen Skains is called and verbalizes understanding. Will continue to monitor.

## 2016-08-24 NOTE — Progress Notes (Signed)
Patient went down for a CT guided thoracentesis, and was incidentally found to have bipedicle fractures of T-10.  Neurologically he is intact without deficits.    These findings were not identified on CT scans of the chest and abdomen that were done on 3/3 and 3/11.    I have spoken with a neurosurgeon on call who will see the patient in the AM.  He has asked that we get an MRI of the T-spine and place the patient at bedrest for now until he can get a TLSO brace.  This patient has been seen and I agree with the findings and treatment plan.  Kathryne Eriksson. Dahlia Bailiff, MD, [place] [phone] (pager) [phone] (direct pager) Trauma Surgeon

## 2016-08-25 ENCOUNTER — Inpatient Hospital Stay (HOSPITAL_COMMUNITY): Payer: Medicare Other

## 2016-08-25 LAB — CBC
HEMATOCRIT: 29.1 % — AB (ref 39.0–52.0)
HEMOGLOBIN: 9.3 g/dL — AB (ref 13.0–17.0)
MCH: 28.6 pg (ref 26.0–34.0)
MCHC: 32 g/dL (ref 30.0–36.0)
MCV: 89.5 fL (ref 78.0–100.0)
Platelets: 214 10*3/uL (ref 150–400)
RBC: 3.25 MIL/uL — ABNORMAL LOW (ref 4.22–5.81)
RDW: 14.6 % (ref 11.5–15.5)
WBC: 6.1 10*3/uL (ref 4.0–10.5)

## 2016-08-25 LAB — BASIC METABOLIC PANEL
ANION GAP: 6 (ref 5–15)
BUN: 15 mg/dL (ref 6–20)
CHLORIDE: 104 mmol/L (ref 101–111)
CO2: 28 mmol/L (ref 22–32)
CREATININE: 1.08 mg/dL (ref 0.61–1.24)
Calcium: 8.3 mg/dL — ABNORMAL LOW (ref 8.9–10.3)
GFR calc non Af Amer: >60 mL/min (ref >60)
Glucose, Bld: 96 mg/dL (ref 65–99)
Potassium: 3.9 mmol/L (ref 3.5–5.1)
Sodium: 138 mmol/L (ref 135–145)

## 2016-08-25 MED ORDER — ENOXAPARIN SODIUM 40 MG/0.4ML ~~LOC~~ SOLN
40.0000 mg | SUBCUTANEOUS | Status: DC
  Administered 2016-08-25: 40 mg via SUBCUTANEOUS
  Filled 2016-08-25: qty 0.4

## 2016-08-25 MED ORDER — PHENOL 1.4 % MT LIQD
1.0000 | OROMUCOSAL | Status: DC | PRN
  Administered 2016-08-25 – 2016-08-27 (×2): 1 via OROMUCOSAL
  Filled 2016-08-25: qty 177

## 2016-08-25 MED ORDER — IPRATROPIUM-ALBUTEROL 0.5-2.5 (3) MG/3ML IN SOLN
3.0000 mL | Freq: Two times a day (BID) | RESPIRATORY_TRACT | Status: DC
  Administered 2016-08-25 – 2016-08-27 (×4): 3 mL via RESPIRATORY_TRACT
  Filled 2016-08-25 (×5): qty 3

## 2016-08-25 NOTE — Care Management Important Message (Signed)
Important Message  Patient Details  Name: [name] MRN: [id number] Date of Birth: 1927-10-01   Medicare Important Message Given:  Yes    Nathen May 08/25/2016, 12:56 PM

## 2016-08-25 NOTE — Progress Notes (Signed)
Patient ID: [name], male   DOB: 17-Feb-1928, 82 y.o.   MRN: [id number] I spoke with his daughter by phone and let her know Dr. Christella Noa will be evaluating his T10 FX.  Georganna Skeans, MD, MPH, FACS Trauma: [phone] General Surgery: [phone]

## 2016-08-25 NOTE — Progress Notes (Signed)
Subjective: Reports abdomen is better, tolerated all of his full liquid breakfast. Noted new findings of T10FX.  Objective: Vital signs in last 24 hours: Temp:  [97.4 F (36.3 C)-98.8 F (37.1 C)] 98.6 F (37 C) (03/15 0700) Pulse Rate:  [65-80] 65 (03/15 0700) Resp:  [12-26] 15 (03/15 0700) BP: (137-164)/(63-77) 149/63 (03/15 0700) SpO2:  [92 %-100 %] 100 % (03/15 0700) Weight:  [83.9 kg (184 lb 14.4 oz)] 83.9 kg (184 lb 14.4 oz) (03/15 0500) Last BM Date: 08/23/16  Intake/Output from previous day: 03/14 0701 - 03/15 0700 In: 540 [P.O.:540] Out: 275 [Urine:275] Intake/Output this shift: No intake/output data recorded.  General appearance: alert and cooperative Resp: clear to auscultation bilaterally Cardio: regular rate and rhythm GI: soft, less distended, active BS, NT  Neuro: alert, moves all ext  Lab Results: CBC   Recent Labs  08/24/16 0326 08/25/16 0309  WBC 5.8 6.1  HGB 9.4* 9.3*  HCT 29.3* 29.1*  PLT 218 214   BMET  Recent Labs  08/23/16 0334 08/25/16 0309  NA 139 138  K 4.3 3.9  CL 112* 104  CO2 22 28  GLUCOSE 93 96  BUN 13 15  CREATININE 0.97 1.08  CALCIUM 7.7* 8.3*   PT/INR No results for input(s): LABPROT, INR in the last 72 hours. ABG No results for input(s): PHART, HCO3 in the last 72 hours.  Invalid input(s): PCO2, PO2  Studies/Results: Dg Chest 2 View  Result Date: 08/23/2016 CLINICAL DATA:  Shortness of Breath EXAM: CHEST  2 VIEW COMPARISON:  08/20/2016 FINDINGS: Subcutaneous emphysema again noted in the right chest wall. No visible pneumothorax. Continued small to moderate right effusion with right lung airspace disease. No confluent opacity on the left. Heart is upper limits normal in size. IMPRESSION: Small moderate right pleural effusion with right lung airspace disease, slightly increasing since prior study. Stable right chest wall subcutaneous emphysema.  No pneumothorax. Electronically Signed   By: Rolm Baptise M.D.   On:  08/23/2016 12:36   Ct Chest Wo Contrast  Result Date: 08/24/2016 CLINICAL DATA:  [place] from tree several days ago. Pneumomediastinum and pneumothorax previously noted. Subcutaneous gas previously noted. Short of breath following thoracentesis EXAM: CT CHEST WITHOUT CONTRAST TECHNIQUE: Multidetector CT imaging of the chest was performed following the standard protocol without IV contrast. COMPARISON:  CT chest 08/13/2016 chest radiograph 08/23/2016 FINDINGS: Cardiovascular: Again demonstrated small amount of gas in the anterior mediastinum and mediastinal fat anterior to the heart. No pericardial fluid. Airways appear normal. Esophagus normal. Mediastinum/Nodes: No axillary supraclavicular adenopathy. There is subcutaneous gas along the RIGHT chest wall extending to the RIGHT neck and to lesser degree the LEFT neck and left anterior chest. Lungs/Pleura: No pneumothorax present. There is bibasilar atelectasis pleural effusions. Upper Abdomen: Limited view of the upper abdomen demonstrates no peritoneal fluid. Adrenal glands are normal. Musculoskeletal: Fracture the posterior eighth rib, seventh rib. There are fractures noted in the bilateral pedicles at T10 (image 64, series 8 and image 57, series 8). Fractures are mildly distracted. Fracture extends the posterior wall of the T2 vertebral body. No loss of vertebral body height. Probable fracture the superior endplate of Y63. No loss vertebral body height. No paraspinal or epidural hematoma evident. IMPRESSION: 1. No pneumothorax. 2. Bilateral small pleural effusions and medial basilar atelectasis. 3. Subcutaneous emphysema and pneumomediastinum not changed. 4. Posterior RIGHT seventh and eighth rib fractures. 5. BILATERAL SUBACUTE PEDICLE FRACTURES AT T10. These are UNSTABLE fractures. Recommend nonemergent neurosurgical consultation. 6. Probable superior endplate  fracture T11. Findings conveyed toFloor nurse, Chama on 08/24/2016  at18:36. Electronically Signed    By: Suzy Bouchard M.D.   On: 08/24/2016 18:44   Mr Thoracic Spine Wo Contrast  Result Date: 08/24/2016 CLINICAL DATA:  Follow-up T10 unstable fracture incidentally noted during CT-guided thoracentesis. EXAM: MRI THORACIC SPINE WITHOUT CONTRAST TECHNIQUE: Multiplanar, multisequence MR imaging of the thoracic spine was performed. No intravenous contrast was administered. COMPARISON:  CT chest August 24, 2016 at 1731 hours FINDINGS: ALIGNMENT: Maintenance of the thoracic kyphosis. No malalignment. VERTEBRAE/DISCS: Low T1 and bright STIR signal within the T10 and T11 vertebral bodies extending into the posterior elements. Linear low T1 through T10 posterior elements corresponding to known fracture. No retropulsed bony fragments. Mild (less than 20%) T11 vertebral body height loss with bright STIR signal within the T10-11 disc, to lesser extent at T9-10. Multilevel mild chronic discogenic endplate changes. Intervertebral disc heights generally preserved with slight desiccation and mineralization is evidenced on today's CT. CORD: Thoracic spinal cord is normal morphology and signal characteristics to the level of the conus medullaris which terminates at T12-L1. PREVERTEBRAL AND PARASPINAL SOFT TISSUES: T2 bright signal within the anterior longitudinal ligament T10-11, within the interspinous and supraspinous ligaments at T10-11. Mild bright STIR paraspinal muscle strain T10-11 and more caudal levels. Dependent pleural effusion and consolidation partially imaged. T2 bright cysts in the kidneys. DISC LEVELS: No disc bulge, canal stenosis or neural foraminal narrowing any level. IMPRESSION: Acute appearing T10-3 column fracture without height loss. Mild T11 acute compression fracture and edema extending to the posterior elements. Focally disrupted T10-11 ligaments. Low-grade lower thoracic paraspinal muscle strain. No neurocompression. Electronically Signed   By: Elon Alas M.D.   On: 08/24/2016 23:50   Dg  Chest Port 1 View  Result Date: 08/25/2016 CLINICAL DATA:  83 year old male status post fall with chest trauma including T10 Chance type fracture, right rib fractures, pleural effusions, pneumomediastinum and subcutaneous gas. EXAM: PORTABLE CHEST 1 VIEW COMPARISON:  Chest CT 08/24/2016 and earlier. FINDINGS: Portable AP semi upright view at 0645 hours. Stable mild right chest wall subcutaneous gas. Improved lung volumes since 08/23/2016. Mediastinal contours remain normal. No pneumothorax. No pulmonary edema. No pleural effusion is evident. No confluent pulmonary opacity. Stable right thoracic inlet surgical clips. Stable visualized osseous structures. IMPRESSION: 1. Improved lung volumes and ventilation since 08/23/2016. Pleural effusions are no longer evident, no confluent pulmonary opacity. 2. T10 Chance type fracture and right rib fractures better demonstrated on yesterday CT and MRI. Electronically Signed   By: Genevie Ann M.D.   On: 08/25/2016 07:34   US Thoracentesis Asp Pleural Space W/img Guide  Result Date: 08/25/2016 INDICATION: Patient with history of fall now with right pleural effusion. Request is made for diagnostic and therapeutic thoracentesis. EXAM: ULTRASOUND GUIDED DIAGNOSTIC AND THERAPEUTIC RIGHT THORACENTESIS MEDICATIONS: 10 mL 1% lidocaine COMPLICATIONS: None immediate. PROCEDURE: An ultrasound guided thoracentesis was thoroughly discussed with the patient and questions answered. The benefits, risks, alternatives and complications were also discussed. The patient understands and wishes to proceed with the procedure. Written consent was obtained. Ultrasound was performed to localize and mark an adequate pocket of fluid in the right chest. The area was then prepped and draped in the normal sterile fashion. 1% Lidocaine was used for local anesthesia. Under ultrasound guidance a Safe-T-centesis catheter was introduced. Thoracentesis was performed. The catheter was removed and a dressing  applied. FINDINGS: A total of approximately 900 mL of bloody fluid was removed. Samples were sent to the laboratory as  requested by the clinical team. IMPRESSION: Successful ultrasound guided diagnostic and therapeutic right thoracentesis yielding 900 mL of pleural fluid. Read by:  Brynda Greathouse PA-C Electronically Signed   By: Sandi Mariscal M.D.   On: 08/24/2016 17:25    Anti-infectives: Anti-infectives    None      Assessment/Plan: 20-25 foot fall from tree Right PTX with subcutaneous emphysema Right sided costochondral fractures (4-6) sternum is intact Small anterior mediastinal hematoma R pleural effusion - S/P thoracentesis 3/14 Hemoperitoneum - Hb stable and abd exam better T 10 FX - keep flat in bed, Dr. Christella Noa to consult Right ingunial hernia containing loops of small bowel and colonic diverticulosis- now reduced as seen on CT VTE- resume Lovenox FEN-  advance diet Disposition/Plan - NS consult   LOS: 12 days    Georganna Skeans, MD, MPH, FACS Trauma: [phone] General Surgery: [phone]  3/15/2018Patient ID: [name], male   DOB: [birth year], 83 y.o.   MRN: [id number]

## 2016-08-25 NOTE — Progress Notes (Signed)
PT Cancellation Note  Patient Details Name: Aidian Salomon MRN: [id number] DOB: Oct 02, 1927   Cancelled Treatment:    Reason Eval/Treat Not Completed: Medical issues which prohibited therapy (TLSO arrived to room, awaiting neurosurgery consult). Spoke with nursing.  WIll re-attempt to see pt later this afternoon as able.  [place], PT [phone]  [place] 08/25/2016, 12:26 PM

## 2016-08-25 NOTE — Clinical Social Work Note (Signed)
Clinical Social Worker continuing to follow patient and family for support and discharge planning needs.  CSW spoke with patient daughter, [name], who states she has visited facilities and they are no longer agreeable to SNF placement.  Patient daughter would like to communicate with inpatient rehab coordinator again as family has made arrangements for 24 hour support at discharge.  CSW left message for CIR coordinator regarding patient family wishes.  CSW remains available for support and to facilitate patient discharge needs once medically stable.  Barbette Or, [place]

## 2016-08-25 NOTE — Consult Note (Signed)
Reason for Consult:T10 fracture Referring Physician: Trauma ED  [name] is an 83 y.o. male.  HPI: whom fell from a tree approximately 28mters fracturing multiple ribs, thoracic spine, pneumothorax. Asked to evaluate thoracic spine fracture  Past Medical History:  Diagnosis Date  . At risk for sleep apnea    STOP-BANG= 4    SENT TO PCP 04-04-2014  . BPH (benign prostatic hypertrophy)   . Depression   . Diverticulosis of colon   . GERD (gastroesophageal reflux disease)   . Gilbert's syndrome   . Headache    migraines  . Hearing loss   . Heart murmur    "Calcified aortic valve"  . Hepatitis    Hep A as a child  . History of gastroenteritis   . History of kidney stones   . History of syncope    2002--  vasovagal  . HOH (hard of hearing)    refuses to wear his Hearing Aid  . Hypertension   . Inguinal hernia    right  . Internal hemorrhoid, bleeding   . Moderate aortic stenosis    a. Last echo 11/2013, 06/2014.  .Marland KitchenNocturia   . Normal coronary arteries    a. Cath 07/2010: normal coronaries. Felt to have noncardiac CP/SOB at that time possibly r/t anxiety.  . RA (rheumatoid arthritis) ([facility]    bilateral hands/ wrist--  seronegative  . Rotator cuff syndrome of right shoulder 08/01/2013  . Shortness of breath dyspnea    with exertion  . Stone, kidney   . Thyroid cyst   . Thyroid nodule    noted 11-2009  . Wears dentures     Past Surgical History:  Procedure Laterality Date  . CARDIAC CATHETERIZATION  07-19-2010  dr tTressia Minersturner   normal coronary arteries,  ef 60%,  moderate aortic stenosis- gradient 182mg, normal right heart pressure  . CARDIOVASCULAR STRESS TEST  05/ 2011   dr skMarlou Porch normal low risk perfusion study  . CATARACT EXTRACTION W/ INTRAOCULAR LENS  IMPLANT, BILATERAL  2013  . COLONOSCOPY  2010  approx  . THYROID LOBECTOMY Right 05/05/2015   Procedure: RIGHT THYROID LOBECTOMY;  Surgeon: ToArmandina GemmaMD;  [facility]: [facility];  Laterality:  Right;  . TRANSANAL HEMORRHOIDAL DEARTERIALIZATION N/A 04/09/2014   Procedure: TRANSANAL HEMORRHOIDAL DEARTERIALIZATION OF INTERNAL HEMORROIDS;  Surgeon: AlLeighton RuffMD;  [facility] Service: General;  Laterality: N/A;  . TRANSTHORACIC ECHOCARDIOGRAM  11-26-2013   moderate focal basal and mild LVH/  ef 6550-56%/grade I diastolic dysfunction/ mild LAE/  moderate calcification with stenosis AV with mild regurg,  gradients 35 abd 58 mmHg /  mild TR    Family History  Problem Relation Age of Onset  . Pulmonary embolism Mother 4369  caused by complications of surgery  . CVA Father   . Diabetes Brother   . Diabetes Brother   . Breast cancer Sister     Social History:  reports that he quit smoking about 35 years ago. His smoking use included Cigarettes. He started smoking about 43 years ago. He quit after 40.00 years of use. He has never used smokeless tobacco. He reports that he drinks alcohol. He reports that he does not use drugs.  Allergies:  Allergies  Allergen Reactions  . Ativan [Lorazepam]     hallucinations    Medications: I have reviewed the patient's current medications.  Results for orders placed or performed during the hospital encounter of  08/13/16 (from the past 48 hour(s))  CBC     Status: Abnormal   Collection Time: 08/24/16  3:26 AM  Result Value Ref Range   WBC 5.8 4.0 - 10.5 K/uL   RBC 3.27 (L) 4.22 - 5.81 MIL/uL   Hemoglobin 9.4 (L) 13.0 - 17.0 g/dL   HCT 29.3 (L) 39.0 - 52.0 %   MCV 89.6 78.0 - 100.0 fL   MCH 28.7 26.0 - 34.0 pg   MCHC 32.1 30.0 - 36.0 g/dL   RDW 15.1 11.5 - 15.5 %   Platelets 218 150 - 400 K/uL  Culture, body fluid-bottle     Status: None (Preliminary result)   Collection Time: 08/24/16  5:29 PM  Result Value Ref Range   Specimen Description FLUID RIGHT PLEURAL    Special Requests BOTTLES DRAWN AEROBIC AND ANAEROBIC 10CC    Culture NO GROWTH < 24 HOURS    Report Status PENDING   Gram stain     Status: None    Collection Time: 08/24/16  5:29 PM  Result Value Ref Range   Specimen Description FLUID RIGHT PLEURAL    Special Requests NONE    Gram Stain      ABUNDANT WBC PRESENT,BOTH PMN AND MONONUCLEAR NO ORGANISMS SEEN    Report Status 08/24/2016 FINAL   CBC     Status: Abnormal   Collection Time: 08/25/16  3:09 AM  Result Value Ref Range   WBC 6.1 4.0 - 10.5 K/uL   RBC 3.25 (L) 4.22 - 5.81 MIL/uL   Hemoglobin 9.3 (L) 13.0 - 17.0 g/dL   HCT 29.1 (L) 39.0 - 52.0 %   MCV 89.5 78.0 - 100.0 fL   MCH 28.6 26.0 - 34.0 pg   MCHC 32.0 30.0 - 36.0 g/dL   RDW 14.6 11.5 - 15.5 %   Platelets 214 150 - 400 K/uL  Basic metabolic panel     Status: Abnormal   Collection Time: 08/25/16  3:09 AM  Result Value Ref Range   Sodium 138 135 - 145 mmol/L   Potassium 3.9 3.5 - 5.1 mmol/L   Chloride 104 101 - 111 mmol/L   CO2 28 22 - 32 mmol/L   Glucose, Bld 96 65 - 99 mg/dL   BUN 15 6 - 20 mg/dL   Creatinine, Ser 1.08 0.61 - 1.24 mg/dL   Calcium 8.3 (L) 8.9 - 10.3 mg/dL   GFR calc non Af Amer >60 >60 mL/min   GFR calc Af Amer >60 >60 mL/min    Comment: (NOTE) The eGFR has been calculated using the CKD EPI equation. This calculation has not been validated in all clinical situations. eGFR's persistently <60 mL/min signify possible Chronic Kidney Disease.    Anion gap 6 5 - 15    Ct Chest Wo Contrast  Result Date: 08/24/2016 CLINICAL DATA:  [place] from tree several days ago. Pneumomediastinum and pneumothorax previously noted. Subcutaneous gas previously noted. Short of breath following thoracentesis EXAM: CT CHEST WITHOUT CONTRAST TECHNIQUE: Multidetector CT imaging of the chest was performed following the standard protocol without IV contrast. COMPARISON:  CT chest 08/13/2016 chest radiograph 08/23/2016 FINDINGS: Cardiovascular: Again demonstrated small amount of gas in the anterior mediastinum and mediastinal fat anterior to the heart. No pericardial fluid. Airways appear normal. Esophagus normal.  Mediastinum/Nodes: No axillary supraclavicular adenopathy. There is subcutaneous gas along the RIGHT chest wall extending to the RIGHT neck and to lesser degree the LEFT neck and left anterior chest. Lungs/Pleura: No pneumothorax present. There is bibasilar  atelectasis pleural effusions. Upper Abdomen: Limited view of the upper abdomen demonstrates no peritoneal fluid. Adrenal glands are normal. Musculoskeletal: Fracture the posterior eighth rib, seventh rib. There are fractures noted in the bilateral pedicles at T10 (image 64, series 8 and image 57, series 8). Fractures are mildly distracted. Fracture extends the posterior wall of the T2 vertebral body. No loss of vertebral body height. Probable fracture the superior endplate of Q67. No loss vertebral body height. No paraspinal or epidural hematoma evident. IMPRESSION: 1. No pneumothorax. 2. Bilateral small pleural effusions and medial basilar atelectasis. 3. Subcutaneous emphysema and pneumomediastinum not changed. 4. Posterior RIGHT seventh and eighth rib fractures. 5. BILATERAL SUBACUTE PEDICLE FRACTURES AT T10. These are UNSTABLE fractures. Recommend nonemergent neurosurgical consultation. 6. Probable superior endplate fracture Y19. Findings conveyed toFloor nurse, Chama on 08/24/2016  at18:36. Electronically Signed   By: Suzy Bouchard M.D.   On: 08/24/2016 18:44   Mr Thoracic Spine Wo Contrast  Result Date: 08/24/2016 CLINICAL DATA:  Follow-up T10 unstable fracture incidentally noted during CT-guided thoracentesis. EXAM: MRI THORACIC SPINE WITHOUT CONTRAST TECHNIQUE: Multiplanar, multisequence MR imaging of the thoracic spine was performed. No intravenous contrast was administered. COMPARISON:  CT chest August 24, 2016 at 1731 hours FINDINGS: ALIGNMENT: Maintenance of the thoracic kyphosis. No malalignment. VERTEBRAE/DISCS: Low T1 and bright STIR signal within the T10 and T11 vertebral bodies extending into the posterior elements. Linear low T1 through  T10 posterior elements corresponding to known fracture. No retropulsed bony fragments. Mild (less than 20%) T11 vertebral body height loss with bright STIR signal within the T10-11 disc, to lesser extent at T9-10. Multilevel mild chronic discogenic endplate changes. Intervertebral disc heights generally preserved with slight desiccation and mineralization is evidenced on today's CT. CORD: Thoracic spinal cord is normal morphology and signal characteristics to the level of the conus medullaris which terminates at T12-L1. PREVERTEBRAL AND PARASPINAL SOFT TISSUES: T2 bright signal within the anterior longitudinal ligament T10-11, within the interspinous and supraspinous ligaments at T10-11. Mild bright STIR paraspinal muscle strain T10-11 and more caudal levels. Dependent pleural effusion and consolidation partially imaged. T2 bright cysts in the kidneys. DISC LEVELS: No disc bulge, canal stenosis or neural foraminal narrowing any level. IMPRESSION: Acute appearing T10-3 column fracture without height loss. Mild T11 acute compression fracture and edema extending to the posterior elements. Focally disrupted T10-11 ligaments. Low-grade lower thoracic paraspinal muscle strain. No neurocompression. Electronically Signed   By: Elon Alas M.D.   On: 08/24/2016 23:50   Dg Chest Port 1 View  Result Date: 08/25/2016 CLINICAL DATA:  83 year old male status post fall with chest trauma including T10 Chance type fracture, right rib fractures, pleural effusions, pneumomediastinum and subcutaneous gas. EXAM: PORTABLE CHEST 1 VIEW COMPARISON:  Chest CT 08/24/2016 and earlier. FINDINGS: Portable AP semi upright view at 0645 hours. Stable mild right chest wall subcutaneous gas. Improved lung volumes since 08/23/2016. Mediastinal contours remain normal. No pneumothorax. No pulmonary edema. No pleural effusion is evident. No confluent pulmonary opacity. Stable right thoracic inlet surgical clips. Stable visualized osseous  structures. IMPRESSION: 1. Improved lung volumes and ventilation since 08/23/2016. Pleural effusions are no longer evident, no confluent pulmonary opacity. 2. T10 Chance type fracture and right rib fractures better demonstrated on yesterday CT and MRI. Electronically Signed   By: Genevie Ann M.D.   On: 08/25/2016 07:34   US Thoracentesis Asp Pleural Space W/img Guide  Result Date: 08/25/2016 INDICATION: Patient with history of fall now with right pleural effusion. Request is made for  diagnostic and therapeutic thoracentesis. EXAM: ULTRASOUND GUIDED DIAGNOSTIC AND THERAPEUTIC RIGHT THORACENTESIS MEDICATIONS: 10 mL 1% lidocaine COMPLICATIONS: None immediate. PROCEDURE: An ultrasound guided thoracentesis was thoroughly discussed with the patient and questions answered. The benefits, risks, alternatives and complications were also discussed. The patient understands and wishes to proceed with the procedure. Written consent was obtained. Ultrasound was performed to localize and mark an adequate pocket of fluid in the right chest. The area was then prepped and draped in the normal sterile fashion. 1% Lidocaine was used for local anesthesia. Under ultrasound guidance a Safe-T-centesis catheter was introduced. Thoracentesis was performed. The catheter was removed and a dressing applied. FINDINGS: A total of approximately 900 mL of bloody fluid was removed. Samples were sent to the laboratory as requested by the clinical team. IMPRESSION: Successful ultrasound guided diagnostic and therapeutic right thoracentesis yielding 900 mL of pleural fluid. Read by:  Brynda Greathouse PA-C Electronically Signed   By: Sandi Mariscal M.D.   On: 08/24/2016 17:25    ROS Blood pressure (!) 149/76, pulse 75, temperature 98.6 F (37 C), temperature source Oral, resp. rate 20, height _0  (1.753 m), weight 83.9 kg (184 lb 14.4 oz), SpO2 94 %. Physical Exam  Constitutional: He is oriented to person, place, and time. He appears well-developed  and well-nourished. No distress.  HENT:  Head: Normocephalic and atraumatic.  Eyes: Conjunctivae and EOM are normal. Pupils are equal, round, and reactive to light.  Neck: Normal range of motion. Neck supple.  Respiratory: He has wheezes.  GI: Soft. Bowel sounds are normal.  Musculoskeletal: Normal range of motion.  Neurological: He is alert and oriented to person, place, and time. He has normal reflexes. He displays normal reflexes. No cranial nerve deficit. He exhibits normal muscle tone. Coordination normal.  Skin: Skin is warm and dry.  Psychiatric: He has a normal mood and affect. His behavior is normal. Judgment and thought content normal.    Assessment/Plan: Gentleman with bipedicular fractures at T10, T11 compression fracture t10 fracture is unstable, but given the myriad medical issues would clearly like to avoid operative fixation. I will speak with his family tomorrow. No neurologic deficits.  Lenea Bywater L 08/25/2016, 11:08 PM

## 2016-08-25 NOTE — Progress Notes (Signed)
Orthopedic Tech Progress Note Patient Details:  [name] 03-Jul-1927 [id number]  Patient ID: [name], male   DOB: 08-12-1927, 83 y.o.   MRN: [id number]   [name] 08/25/2016, 9:35 AMCalled [employer] for TLSO Brace.

## 2016-08-25 NOTE — Progress Notes (Addendum)
PROGRESS NOTE   [name]  TKZ:[id number] DOB: Jan 30, 1928 DOA: 08/13/2016   PCP: [name], MD   Chief Complaint  Patient presents with  . Trauma    Brief Narrative:  TR[facility] consulted for medical management.   Pt is 83 yo male who was admitted on 08/13/2016 after an episode of fall from tree.    Assessment & Plan   Anemia - ?Blood loss, internal bleeding - CT abd/pelvis listed below - FOBT negative - Anemia panel requested with next blood work  - Hemoglobin on admission 13, dropped to 7 - Transfused 2u PRBCs 3/11 - Hemoglobin trend: 9.9 --> 8.5 --> 9.4 --> 9.3  Acute kidney injury - likely secondary to anemia and poor oral intake - Creatinine peaked to 1.87, now WNL - BMP In AM  Esophageal dysphagia and ileus - CT scan showed normal-appearing stomach, small bowel as well as colon and pneumoperitoneum - Occult blood negative - GI signed off on 3/12 as no further recommendations   Right pneumothorax - 5% of lung volume, present on admission - Continue nebs and incentive spirometry, pulm toilet  - Noted on CT abd/pelvis, pneumomediastinum - right thoracentesis done 3/14 and 900 cc bloody fluid removed and sent for analysis    Deconditioning/Fall - with new appearing T10-3 column fracture without height loss. Mild T11 acute compression fracture and edema extending to the posterior elements. Focally disrupted T10-11 ligaments. Low-grade lower thoracic paraspinal muscle strain. - neurosurgeon to review, I will also discuss with IR to review images, ? vertebroplasty  - Inpatient rehab consulted as well  Elevated troponin/Transient diaphoresis today, w/ diffuse pains - felt multifactorial, including pain, ileus, anemia, demand ischemia - Troponin mildly elevated, but flat - Echocardiogram showed EF 57-32%, grade 1 diastolic dysfunction  Dyspnea/?Acute diastolic heart failure - Patient was receiving IVF, discontinued - Given dose of IV lasix - CT chest post  thoracentesis identified only small bilateral pleural effusions   Hypertension, essential  - SBP in 140's this AM - continue HCTZ and irbesartan   Hyperlipidemina - continue asa and statin  GERD - Continue PPI  Depression - Continue On lexapro   DVT Prophylaxis  Discontinued lovenox given anemia, SCDs  Code Status: Full  Family Communication: no family at bedside this AM, I spoke with daughter over the phone   Disposition Plan: SNF once pt more medically stable   Consultants  [facility]  Cardiology  Gastroenterology - signed off 3/12  CIR - signed off as SNF was recommended   Antibiotics   Anti-infectives    None     Subjective:   Feels better this am.   Objective:   Vitals:   08/25/16 0500 08/25/16 0700 08/25/16 1100 08/25/16 1703  BP:  (!) 149/63 (!) 151/66 (!) 149/76  Pulse:  65 75   Resp:  15 20   Temp: 98.4 F (36.9 C) 98.6 F (37 C) 98.9 F (37.2 C) 98.7 F (37.1 C)  TempSrc:  Oral Oral Oral  SpO2:  100% 94% 94%  Weight: 83.9 kg (184 lb 14.4 oz)     Height:        Intake/Output Summary (Last 24 hours) at 08/25/16 1851 Last data filed at 08/25/16 1800  Gross per 24 hour  Intake              540 ml  Output              675 ml  Net             -  135 ml   Filed Weights   08/22/16 0525 08/23/16 0424 08/25/16 0500  Weight: 84.1 kg (185 lb 6.5 oz) 87.6 kg (193 lb 2 oz) 83.9 kg (184 lb 14.4 oz)    Exam  General: Well developed, well nourished,   HEENT: NCAT, mucous membranes moist.   Cardiovascular: S1 S2 auscultated,RRR  MSK chest wall TTP   Respiratory: better air movement bilaterally   Abdomen: Soft, nontender, distended, + bowel sounds  Extremities: warm dry without cyanosis clubbing or edema  Data Reviewed: I have personally reviewed following labs and imaging studies  CBC:  Recent Labs Lab 08/22/16 0322 08/22/16 1332 08/23/16 0334 08/24/16 0326 08/25/16 0309  WBC 4.3 4.4 4.2 5.8 6.1  HGB 8.4* 9.9* 8.5* 9.4* 9.3*  HCT  25.8* 31.3* 26.6* 29.3* 29.1*  MCV 88.4 89.4 89.6 89.6 89.5  PLT 179 191 192 218 100   Basic Metabolic Panel:  Recent Labs Lab 08/20/16 1026 08/21/16 0313 08/21/16 0844 08/22/16 0322 08/23/16 0334 08/25/16 0309  NA 137 137  --  138 139 138  K 4.2 4.8  --  4.2 4.3 3.9  CL 104 106  --  111 112* 104  CO2 23 25  --  23 22 28   GLUCOSE 142* 162*  --  123* 93 96  BUN 13 27*  --  19 13 15   CREATININE 1.06 1.87*  --  1.15 0.97 1.08  CALCIUM 8.3* 8.1*  --  7.6* 7.7* 8.3*  MG  --   --  2.1  --   --   --    Liver Function Tests:  Recent Labs Lab 08/20/16 1026 08/21/16 0313  AST 42* 39  ALT 39 34  ALKPHOS 54 50  BILITOT 2.1* 1.4*  PROT 5.9* 5.3*  ALBUMIN 3.0* 2.7*    Recent Labs Lab 08/20/16 1026  LIPASE 14   Cardiac Enzymes:  Recent Labs Lab 08/20/16 1026 08/20/16 1458 08/20/16 1905 08/21/16 0313 08/21/16 0511  TROPONINI 0.05* 0.13* 0.14* 0.10* 0.10*   Urine analysis:    Component Value Date/Time   COLORURINE YELLOW 08/13/2016 1820   APPEARANCEUR CLEAR 08/13/2016 1820   LABSPEC 1.038 (H) 08/13/2016 1820   PHURINE 5.0 08/13/2016 1820   GLUCOSEU NEGATIVE 08/13/2016 1820   HGBUR LARGE (A) 08/13/2016 1820   BILIRUBINUR NEGATIVE 08/13/2016 1820   KETONESUR 20 (A) 08/13/2016 1820   PROTEINUR NEGATIVE 08/13/2016 1820   UROBILINOGEN 1.0 04/14/2014 0116   NITRITE NEGATIVE 08/13/2016 1820   LEUKOCYTESUR NEGATIVE 08/13/2016 1820   Radiology Studies: Ct Chest Wo Contrast  Result Date: 08/24/2016 CLINICAL DATA:  [place] from tree several days ago. Pneumomediastinum and pneumothorax previously noted. Subcutaneous gas previously noted. Short of breath following thoracentesis EXAM: CT CHEST WITHOUT CONTRAST TECHNIQUE: Multidetector CT imaging of the chest was performed following the standard protocol without IV contrast. COMPARISON:  CT chest 08/13/2016 chest radiograph 08/23/2016 FINDINGS: Cardiovascular: Again demonstrated small amount of gas in the anterior mediastinum  and mediastinal fat anterior to the heart. No pericardial fluid. Airways appear normal. Esophagus normal. Mediastinum/Nodes: No axillary supraclavicular adenopathy. There is subcutaneous gas along the RIGHT chest wall extending to the RIGHT neck and to lesser degree the LEFT neck and left anterior chest. Lungs/Pleura: No pneumothorax present. There is bibasilar atelectasis pleural effusions. Upper Abdomen: Limited view of the upper abdomen demonstrates no peritoneal fluid. Adrenal glands are normal. Musculoskeletal: Fracture the posterior eighth rib, seventh rib. There are fractures noted in the bilateral pedicles at T10 (image 64, series 8 and image  57, series 8). Fractures are mildly distracted. Fracture extends the posterior wall of the T2 vertebral body. No loss of vertebral body height. Probable fracture the superior endplate of N23. No loss vertebral body height. No paraspinal or epidural hematoma evident. IMPRESSION: 1. No pneumothorax. 2. Bilateral small pleural effusions and medial basilar atelectasis. 3. Subcutaneous emphysema and pneumomediastinum not changed. 4. Posterior RIGHT seventh and eighth rib fractures. 5. BILATERAL SUBACUTE PEDICLE FRACTURES AT T10. These are UNSTABLE fractures. Recommend nonemergent neurosurgical consultation. 6. Probable superior endplate fracture F57. Findings conveyed toFloor nurse, [place] on 08/24/2016  at18:36. Electronically Signed   By: Suzy Bouchard M.D.   On: 08/24/2016 18:44   Mr Thoracic Spine Wo Contrast  Result Date: 08/24/2016 CLINICAL DATA:  Follow-up T10 unstable fracture incidentally noted during CT-guided thoracentesis. EXAM: MRI THORACIC SPINE WITHOUT CONTRAST TECHNIQUE: Multiplanar, multisequence MR imaging of the thoracic spine was performed. No intravenous contrast was administered. COMPARISON:  CT chest August 24, 2016 at 1731 hours FINDINGS: ALIGNMENT: Maintenance of the thoracic kyphosis. No malalignment. VERTEBRAE/DISCS: Low T1 and bright STIR  signal within the T10 and T11 vertebral bodies extending into the posterior elements. Linear low T1 through T10 posterior elements corresponding to known fracture. No retropulsed bony fragments. Mild (less than 20%) T11 vertebral body height loss with bright STIR signal within the T10-11 disc, to lesser extent at T9-10. Multilevel mild chronic discogenic endplate changes. Intervertebral disc heights generally preserved with slight desiccation and mineralization is evidenced on today's CT. CORD: Thoracic spinal cord is normal morphology and signal characteristics to the level of the conus medullaris which terminates at T12-L1. PREVERTEBRAL AND PARASPINAL SOFT TISSUES: T2 bright signal within the anterior longitudinal ligament T10-11, within the interspinous and supraspinous ligaments at T10-11. Mild bright STIR paraspinal muscle strain T10-11 and more caudal levels. Dependent pleural effusion and consolidation partially imaged. T2 bright cysts in the kidneys. DISC LEVELS: No disc bulge, canal stenosis or neural foraminal narrowing any level. IMPRESSION: Acute appearing T10-3 column fracture without height loss. Mild T11 acute compression fracture and edema extending to the posterior elements. Focally disrupted T10-11 ligaments. Low-grade lower thoracic paraspinal muscle strain. No neurocompression. Electronically Signed   By: Elon Alas M.D.   On: 08/24/2016 23:50   Dg Chest Port 1 View  Result Date: 08/25/2016 CLINICAL DATA:  83 year old male status post fall with chest trauma including T10 Chance type fracture, right rib fractures, pleural effusions, pneumomediastinum and subcutaneous gas. EXAM: PORTABLE CHEST 1 VIEW COMPARISON:  Chest CT 08/24/2016 and earlier. FINDINGS: Portable AP semi upright view at 0645 hours. Stable mild right chest wall subcutaneous gas. Improved lung volumes since 08/23/2016. Mediastinal contours remain normal. No pneumothorax. No pulmonary edema. No pleural effusion is evident.  No confluent pulmonary opacity. Stable right thoracic inlet surgical clips. Stable visualized osseous structures. IMPRESSION: 1. Improved lung volumes and ventilation since 08/23/2016. Pleural effusions are no longer evident, no confluent pulmonary opacity. 2. T10 Chance type fracture and right rib fractures better demonstrated on yesterday CT and MRI. Electronically Signed   By: Genevie Ann M.D.   On: 08/25/2016 07:34   US Thoracentesis Asp Pleural Space W/img Guide  Result Date: 08/25/2016 INDICATION: Patient with history of fall now with right pleural effusion. Request is made for diagnostic and therapeutic thoracentesis. EXAM: ULTRASOUND GUIDED DIAGNOSTIC AND THERAPEUTIC RIGHT THORACENTESIS MEDICATIONS: 10 mL 1% lidocaine COMPLICATIONS: None immediate. PROCEDURE: An ultrasound guided thoracentesis was thoroughly discussed with the patient and questions answered. The benefits, risks, alternatives and complications were also discussed.  The patient understands and wishes to proceed with the procedure. Written consent was obtained. Ultrasound was performed to localize and mark an adequate pocket of fluid in the right chest. The area was then prepped and draped in the normal sterile fashion. 1% Lidocaine was used for local anesthesia. Under ultrasound guidance a Safe-T-centesis catheter was introduced. Thoracentesis was performed. The catheter was removed and a dressing applied. FINDINGS: A total of approximately 900 mL of bloody fluid was removed. Samples were sent to the laboratory as requested by the clinical team. IMPRESSION: Successful ultrasound guided diagnostic and therapeutic right thoracentesis yielding 900 mL of pleural fluid. Read by:  Brynda Greathouse PA-C Electronically Signed   By: Sandi Mariscal M.D.   On: 08/24/2016 17:25   Scheduled Meds: . sodium chloride   Intravenous Once  . aspirin EC  81 mg Oral Daily  . cyclobenzaprine  5 mg Oral TID  . docusate sodium  100 mg Oral BID  . enoxaparin  (LOVENOX) injection  40 mg Subcutaneous Q24H  . escitalopram  20 mg Oral Daily  . feeding supplement (ENSURE ENLIVE)  237 mL Oral BID BM  . hydrochlorothiazide  12.5 mg Oral Daily  . ipratropium-albuterol  3 mL Nebulization BID  . irbesartan  75 mg Oral Daily  . ketorolac  15 mg Intravenous Q6H  . pantoprazole  40 mg Oral Daily  . polyethylene glycol  17 g Oral Daily  . simvastatin  20 mg Oral QHS  . traMADol  50 mg Oral Q6H   Continuous Infusions:   LOS: 12 days   Time Spent in minutes   30 minutes  Romilda Joy, Hermann Dottavio MD on 08/25/2016 at 6:51 PM  Between 7am to 7pm - Pager - [phone]  After 7pm go to [email or web address] - password [email or web address]  And look for the night coverage person covering for me after hours  [facility] Office  [phone]

## 2016-08-26 ENCOUNTER — Inpatient Hospital Stay (HOSPITAL_COMMUNITY): Payer: Medicare Other

## 2016-08-26 LAB — CBC
HCT: 30.3 % — ABNORMAL LOW (ref 39.0–52.0)
HEMOGLOBIN: 9.8 g/dL — AB (ref 13.0–17.0)
MCH: 28.9 pg (ref 26.0–34.0)
MCHC: 32.3 g/dL (ref 30.0–36.0)
MCV: 89.4 fL (ref 78.0–100.0)
Platelets: 229 10*3/uL (ref 150–400)
RBC: 3.39 MIL/uL — ABNORMAL LOW (ref 4.22–5.81)
RDW: 14.3 % (ref 11.5–15.5)
WBC: 6 10*3/uL (ref 4.0–10.5)

## 2016-08-26 LAB — BASIC METABOLIC PANEL
Anion gap: 8 (ref 5–15)
BUN: 16 mg/dL (ref 6–20)
CHLORIDE: 100 mmol/L — AB (ref 101–111)
CO2: 29 mmol/L (ref 22–32)
CREATININE: 1.06 mg/dL (ref 0.61–1.24)
Calcium: 8.3 mg/dL — ABNORMAL LOW (ref 8.9–10.3)
GFR calc Af Amer: >60 mL/min (ref >60)
GFR calc non Af Amer: >60 mL/min (ref >60)
Glucose, Bld: 95 mg/dL (ref 65–99)
Potassium: 3.9 mmol/L (ref 3.5–5.1)
SODIUM: 137 mmol/L (ref 135–145)

## 2016-08-26 LAB — PROTIME-INR
INR: 1.17
PROTHROMBIN TIME: 14.9 s (ref 11.4–15.2)

## 2016-08-26 LAB — APTT: aPTT: 36 seconds (ref 24–36)

## 2016-08-26 MED ORDER — ENOXAPARIN SODIUM 40 MG/0.4ML ~~LOC~~ SOLN
40.0000 mg | SUBCUTANEOUS | Status: DC
  Administered 2016-08-27 – 2016-08-28 (×2): 40 mg via SUBCUTANEOUS
  Filled 2016-08-26 (×2): qty 0.4

## 2016-08-26 NOTE — Progress Notes (Signed)
OT Cancellation Note  Patient Details Name: [name] MRN: [id number] DOB: 16-Jun-1927   Cancelled Treatment:    Reason Eval/Treat Not Completed: Patient not medically ready. Pt continues to have active bedrest order. Please update activity orders when appropriate for therapy. Thanks  [place], OT/L  [phone] 08/26/2016 08/26/2016, 11:24 AM

## 2016-08-26 NOTE — Progress Notes (Signed)
Rehab admissions - I spoke with patient at the bedside and then called his daughter.  Daughter has arranged for caregivers after discharge from rehab.  I will have a bed tomorrow for patient if he is cleared by trauma/neurosurgery team for inpatient rehab admit.  Awaiting xrays to decide next steps.  Call me for questions.  #[phone]

## 2016-08-26 NOTE — Plan of Care (Signed)
Problem: Physical Regulation: Goal: Ability to maintain clinical measurements within normal limits will improve Outcome: Progressing Pt on bedrest r/t vertebrae fracture. Per surgeon ok to log roll side to side and keep bed at 25 degrees or less. Pt lying in bed, vs stable. Omega placed on pt during the night due to decrease in 02 while sleeping. Pt O2 saturation currently 96% on 2L [place]. Pt has wheezing throughout and 2 nebulizer treatments were given this shift. Will continue to monitor pt and perform hourly rounding.

## 2016-08-26 NOTE — Clinical Social Work Note (Signed)
Clinical Social Worker continuing to follow patient and family for support and discharge planning needs.  Patient has been evaluated by inpatient rehab and per coordinator, hopeful for patient to admit tomorrow following xrays.  Patient family aware and appreciative.  CSW available for support as needed but will not further pursue SNF placement at this time.  Barbette Or, [place]

## 2016-08-26 NOTE — Progress Notes (Signed)
Patient ID: [name], male   DOB: [birth year], 83 y.o.   MRN: [id number]   Dr Christella Noa has discussed case with Dtr  He feels vertebroplasty/kyphoplasty not warranted in this pt.  Will defer to plan of Neuro surgery

## 2016-08-26 NOTE — Plan of Care (Signed)
Problem: Activity: Goal: Risk for activity intolerance will decrease Outcome: Not Progressing Patient is on bedrest per MD to protect back from further injury, will mobilize patient when approved by MD. Will perform range of motion exercises as permitted.

## 2016-08-26 NOTE — Consult Note (Signed)
  Date of consultation: 08/26/2016 Request of Dr. Doreen Salvage M.D. Reason for request: Family request for consultation Diagnosis: T10, T11 vertebral fractures status post fall from tree Chief complaint: Fall from tree approximately 10 feet History of present illness Mr. iliac iliopsoas of is an 83 year old individual who apparently had a fall from a tree on March 9. He fell approximately 10 feet. He was 25 feet up in the tree and the fall was broken by some branches. Nonetheless he complained substantially of back pain and was found to have a T10 and T11 compression fracture with a by pedicular fracture of T10. The family has requested this consultation as they have had advice that they should seek kyphoplasty early on and were concerned also that the patient is having substantial pain.  On the visit today in talking to the patient he notes that he has some moderate pain with movement but while lying in bed and turning side to side he is reasonably comfortable. He notes no dysesthesias in his legs and he can move his legs well. He has good strength in his upper extremities also. He's not had any problems with voiding.  Impression I reviewed the patient's films including CT scans of the chest that I been performed at the time of admission. I note that the patient does have by pedicular fractures at the T10 level however these are not unstable per se. The anterior column and the posterior column are well aligned and show no disruption. I believe that the patient can be mobilized with the use of a TLSO as his been described and advised by Dr. Cyndy Freeze. I would advise against kyphoplasty at this time as this is an acute fracture and does not appear that the patient has any significant osteoporosis. Typically acute compression fractures without osteoporosis 10 to heal quite well on the phone. He already has a slight deformity from these fractures and this will likely progress a little bit with time. Did be advisable  to treat this process conservatively and I believe that his pain should subside within the next 2-3 weeks spontaneously. Kyphoplasty can be considered if pain persists beyond the first 4 weeks from the fracture.  In talking to the nurse apparently one daughter is familiar with me and specifically requested the consult. I called her by phone and discussed the situation with her and my advice regarding treatment of this process. I will follow up with this patient on an outpatient basis and I'll discuss the findings and plan with Dr. Cyndy Freeze while here.

## 2016-08-26 NOTE — PMR Pre-admission (Signed)
PMR Admission Coordinator Pre-Admission Assessment  Patient: [name] is an 83 y.o., male MRN: [id number] DOB: 05-21-1928 Height: 5\' 9"  (175.3 cm) Weight: 74.3 kg (163 lb 12.8 oz)              Insurance Information HMO: No   PPO:       PCP:       IPA:       80/20:       OTHER:   PRIMARY: Medicare A/B      Policy#: [id number]      Subscriber:  [name] CM Name:        Phone#:       Fax#:   Pre-Cert#:        Employer:  Retired Benefits:  Phone #:       Name: Checked in [place]. Date: A=08/12/98 and B=06/13/96     Deduct: $1340      Out of Pocket Max: None      Life Max: unlimited CIR: 100%      SNF: 100 days Outpatient: 80%     Co-Pay: 20% Home Health: 100%      Co-Pay: none DME: 80%     Co-Pay: 20% Providers: patient's choice  SECONDARY: Medicaid of [place]      Policy#: [id number]      Subscriber:  [name] CM Name:        Phone#:       Fax#:   Pre-Cert#:        Employer: Retired Benefits:  Phone #: [phone]     Name:  Automated Eff. Date:  Eligible 08/27/16 with coverage code [id number]     Deduct:        Out of Pocket Max:        Life Max:   CIR:        SNF:   Outpatient:       Co-Pay:   Home Health:        Co-Pay:   DME:       Co-Pay:    Emergency Contact Information Contact Information    Name Relation Home Work Mobile   [place] Daughter [phone]  [phone]   Gregary Cromer Daughter [phone]       Current Medical History  Patient Admitting Diagnosis:  Polytrauma  History of Present Illness: An 83 y.o.right handed [place] speaking malewith history of hypertension. Per chart review patient lives alone. His daughter lives in the home next to him. Patient reported to be independent prior to admission. Question of 24 hour assistance on discharge. Presented 08/13/2016 after a fall from a tree while cutting branches. No report of loss of consciousness. Complaints of back pain. Cranial CT reviewed, unremarkable for acute process. CT cervical spine  negative. CT of the chest abdomen and pelvis showed a right pneumothorax with right chest wall subcutaneous emphysema. Right costochondral fractures. Small anterior mediastinal hematoma. No evidence for great vessel injury or acute intra-abdominal injury. Conservative care of right pneumothorax.MRI thoracic spine for increasing back pain 08/24/2016 showed acute appearing T10 fracture without height loss. Mild T11 acute compression fracture and edema extending to the posterior element. No neurocompression. Neurosurgery Dr. Cyndy Freeze consulted advise conservative care with TLSO back brace applied. Patient underwent a right thoracentesis for right pleural effusion 08/24/2016 with a 900 mL yield of bloody fluid removed. Decrease in hemoglobin with follow-up CT abdomen and pelvis showed pneumoperitoneum of which patient was transfused close monitoring of CBC with latest hemoglobin 8.5. Patient developed nausea abdominal bloating as  well as reports of chronic dysphagia with gastroenterology consulted 08/20/2016. A modified barium swallow was completedand advised regular diet. A plain abdominal film suggested mild large and small bowel ileus and no signs of peritonitis. A nasogastric tube was placed for decompressionand Diet slowly advanced.  Hospital course pain management.Patient did have bouts of agitation and restlessness and was provided with a sitter for safety. Physical occupational therapy evaluations completed. Recommendations for physical medicine rehabilitation consult. Patient to be admitted for a comprehensive inpatient rehabilitation program.   Past Medical History  Past Medical History:  Diagnosis Date  . At risk for sleep apnea    STOP-BANG= 4    SENT TO PCP 04-04-2014  . BPH (benign prostatic hypertrophy)   . Depression   . Diverticulosis of colon   . GERD (gastroesophageal reflux disease)   . Gilbert's syndrome   . Headache    migraines  . Hearing loss   . Heart murmur    "Calcified  aortic valve"  . Hepatitis    Hep A as a child  . History of gastroenteritis   . History of kidney stones   . History of syncope    2002--  vasovagal  . HOH (hard of hearing)    refuses to wear his Hearing Aid  . Hypertension   . Inguinal hernia    right  . Internal hemorrhoid, bleeding   . Moderate aortic stenosis    a. Last echo 11/2013, 06/2014.  Marland Kitchen Nocturia   . Normal coronary arteries    a. Cath 07/2010: normal coronaries. Felt to have noncardiac CP/SOB at that time possibly r/t anxiety.  . RA (rheumatoid arthritis) ([place])    bilateral hands/ wrist--  seronegative  . Rotator cuff syndrome of right shoulder 08/01/2013  . Shortness of breath dyspnea    with exertion  . Stone, kidney   . Thyroid cyst   . Thyroid nodule    noted 11-2009  . Wears dentures     Family History  family history includes Breast cancer in his sister; CVA in his father; Diabetes in his brother and brother; Pulmonary embolism (age of onset: 46) in his mother.  Prior Rehab/Hospitalizations: No previous rehab admissions.  Has the patient had major surgery during 100 days prior to admission? No  Current Medications   Current Facility-Administered Medications:  .  aspirin EC tablet 81 mg, 81 mg, Oral, Daily, Rolm Bookbinder, MD, 81 mg at 08/26/16 0954 .  bisacodyl (DULCOLAX) suppository 10 mg, 10 mg, Rectal, Daily PRN, Maryann Mikhail, DO .  cyclobenzaprine (FLEXERIL) tablet 5 mg, 5 mg, Oral, TID, Judeth Horn, MD, 5 mg at 08/26/16 2129 .  docusate sodium (COLACE) capsule 100 mg, 100 mg, Oral, BID, Clovis Riley, MD, 100 mg at 08/26/16 2122 .  enoxaparin (LOVENOX) injection 40 mg, 40 mg, Subcutaneous, Q24H, Monia Sabal, PA-C, 40 mg at 08/27/16 1131 .  escitalopram (LEXAPRO) tablet 20 mg, 20 mg, Oral, Daily, Rolm Bookbinder, MD, 20 mg at 08/26/16 0954 .  feeding supplement (ENSURE ENLIVE) (ENSURE ENLIVE) liquid 237 mL, 237 mL, Oral, BID BM, Teena Irani, MD, 237 mL at 08/25/16 1400 .   hydrochlorothiazide (MICROZIDE) capsule 12.5 mg, 12.5 mg, Oral, Daily, Rolm Bookbinder, MD, 12.5 mg at 08/26/16 0954 .  ibuprofen (ADVIL,MOTRIN) tablet 800 mg, 800 mg, Oral, Q8H PRN, Rolm Bookbinder, MD, 800 mg at 08/26/16 0112 .  ipratropium-albuterol (DUONEB) 0.5-2.5 (3) MG/3ML nebulizer solution 3 mL, 3 mL, Nebulization, Q2H PRN, Maryann Mikhail, DO, 3 mL at 08/26/16 0115 .  ipratropium-albuterol (DUONEB) 0.5-2.5 (3) MG/3ML nebulizer solution 3 mL, 3 mL, Nebulization, BID, Georganna Skeans, MD, 3 mL at 08/26/16 2049 .  irbesartan (AVAPRO) tablet 75 mg, 75 mg, Oral, Daily, Rolm Bookbinder, MD, 75 mg at 08/26/16 0954 .  magic mouthwash w/lidocaine, 10 mL, Oral, QID, Coralie Keens, MD, 10 mL at 08/27/16 1427 .  magnesium hydroxide (MILK OF MAGNESIA) suspension 15 mL, 15 mL, Oral, Daily PRN, Clovis Riley, MD .  menthol-cetylpyridinium (CEPACOL) lozenge 3 mg, 1 lozenge, Oral, PRN, Judeth Horn, MD .  [DISCONTINUED] ondansetron [facility]) tablet 4 mg, 4 mg, Oral, Q6H PRN **OR** ondansetron (ZOFRAN) injection 4 mg, 4 mg, Intravenous, Q6H PRN, Rolm Bookbinder, MD, 4 mg at 08/20/16 1605 .  pantoprazole (PROTONIX) EC tablet 40 mg, 40 mg, Oral, Daily, Rolm Bookbinder, MD, 40 mg at 08/27/16 1428 .  phenol (CHLORASEPTIC) mouth spray 1 spray, 1 spray, Mouth/Throat, PRN, Georganna Skeans, MD, 1 spray at 08/27/16 0816 .  polyethylene glycol (MIRALAX / GLYCOLAX) packet 17 g, 17 g, Oral, Daily, Jessica L Focht, PA, 17 g at 08/26/16 0954 .  simvastatin (ZOCOR) tablet 20 mg, 20 mg, Oral, QHS, Rolm Bookbinder, MD, 20 mg at 08/26/16 2122 .  traMADol (ULTRAM) tablet 50 mg, 50 mg, Oral, Q6H, Georganna Skeans, MD, 50 mg at 08/27/16 1428  Patients Current Diet: Diet Heart Room service appropriate? Yes; Fluid consistency: Thin  Precautions / Restrictions Precautions Precautions: Fall Precaution Comments: Pt accustomed to being independent so needs chair alarm when up.  Not safe to get up  alone Restrictions Weight Bearing Restrictions: No   Has the patient had 2 or more falls or a fall with injury in the past year?No  Prior Activity Level Community (5-7x/wk): Went out daily, was active, was driving.  Home Assistive Devices / Equipment Home Assistive Devices/Equipment: None Home Equipment: Grab bars - tub/shower  Prior Device Use: Indicate devices/aids used by the patient prior to current illness, exacerbation or injury? None  Prior Functional Level Prior Function Level of Independence: Independent Comments: drives  Self Care: Did the patient need help bathing, dressing, using the toilet or eating?  Independent  Indoor Mobility: Did the patient need assistance with walking from room to room (with or without device)? Independent  Stairs: Did the patient need assistance with internal or external stairs (with or without device)? Independent  Functional Cognition: Did the patient need help planning regular tasks such as shopping or remembering to take medications? Independent  Current Functional Level Cognition  Overall Cognitive Status: Within Functional Limits for tasks assessed Orientation Level: Oriented X4 General Comments: pt restless due to increased SOB, needed breathing tx during session due to wheezing and SOB    Extremity Assessment (includes Sensation/Coordination)  Upper Extremity Assessment: Defer to OT evaluation  Lower Extremity Assessment: Generalized weakness    ADLs  Overall ADL's : Needs assistance/impaired Eating/Feeding: Independent, Sitting Grooming: Wash/dry hands, Wash/dry face, Oral care, Supervision/safety, Standing Grooming Details (indicate cue type and reason): Pt stood at sink for appx 6 minutes with S. Upper Body Bathing: Set up, Sitting Lower Body Bathing: Moderate assistance, Sit to/from stand Lower Body Bathing Details (indicate cue type and reason): followed up on use of long sponge Upper Body Dressing : Minimal  assistance, Sitting Upper Body Dressing Details (indicate cue type and reason): front opening gown Lower Body Dressing: Sit to/from stand, Minimal assistance Lower Body Dressing Details (indicate cue type and reason): practiced use of sock aide and demonstrated use of reacher for pants, removing sock Toilet Transfer:  Min guard, RW, Cueing for Office manager Details (indicate cue type and reason): pt walked to bathroom. cues to reach back when sitting and to keep walker close by. Toileting- Water quality scientist and Hygiene: Min guard, Sit to/from stand, Cueing for safety Toileting - Clothing Manipulation Details (indicate cue type and reason): cues to not get up alone. Do not leave in bathroom alone; pt will get up. Functional mobility during ADLs: Minimal assistance, Rolling walker, Cueing for sequencing, Cueing for safety General ADL Comments: Educated family in safe techniques and need for someone to be with pt at all times at this point.    Mobility  Overal bed mobility: Needs Assistance Bed Mobility: Supine to Sit, Rolling Rolling: Min assist Supine to sit: Min assist Sit to supine: Supervision, HOB elevated (with bed rail) General bed mobility comments: bed mobility and supine to sit performed x 2; pt needing mod cues for proper sequencing and technique    Transfers  Overall transfer level: Needs assistance Equipment used: Rolling walker (2 wheeled) Transfers: Sit to/from Stand Sit to Stand: Min guard Stand pivot transfers: Min guard General transfer comment: deferred due to SOB    Ambulation / Gait / Stairs / Emergency planning/management officer  Ambulation/Gait Ambulation/Gait assistance: Physicist, medical (Feet): 170 Feet Assistive device: Rolling walker (2 wheeled) Gait Pattern/deviations: Step-through pattern, Decreased stride length, Drifts right/left General Gait Details: deferred due to SOB Gait velocity: decr Gait velocity interpretation: Below normal speed for  age/gender    Posture / Balance Balance Overall balance assessment: Needs assistance Sitting-balance support: No upper extremity supported Sitting balance-Leahy Scale: Fair (pt restless sitting EOB) Standing balance support: During functional activity, Single extremity supported Standing balance-Leahy Scale: Poor Standing balance comment: UE assist for standing.    Special needs/care consideration BiPAP/CPAP No CPM No Continuous Drip IV No Dialysis No        Life Vest No Oxygen No Special Bed No Trach Size No Wound Vac (area) No     Skin No                           Bowel mgmt: Last BM 08/24/16 Bladder mgmt: Had a condom catheter Diabetic mgmt No    Previous Home Environment Living Arrangements: Children (daughter who works most of time in [place]) Available Help at Discharge: Family, Available 24 hours/day (until 3/17 and then intermittently) Type of Home: House Home Layout: One level Home Access: Stairs to enter [employer] of Steps: 2 Bathroom Shower/Tub: Chiropodist: Handicapped height [place]: No  Discharge Living Setting Plans for Discharge Living Setting: House, Lives with (comment) (Plans to return to daughter's house.) Type of Home at Discharge: House Discharge Home Layout: One level Discharge Home Access: Stairs to enter Entrance Stairs-Number of Steps: 3 steps Does the patient have any problems obtaining your medications?: No  Social/Family/Support Systems Patient Roles: Parent (Has daughters and granddaughters.) Contact Information: Gee Habig - daughter Anticipated Caregiver: Daughters, grand daughters and hired help as needed Anticipated Ambulance person Information: [name] - daughter - [phone] Ability/Limitations of Caregiver: [name] lives in [place], but will be in town next week.  Inna lives 3 houses down the street from patient Caregiver Availability: 24/7 (Family to rotate caregiver assistance and  provide 24/7 ) I spoke with daughter, [name], who assures me that family will provide the care after rehab and that she will hire help to cover if a family member is not available. Discharge Plan  Discussed with Primary Caregiver: Yes Is Caregiver In Agreement with Plan?: Yes Does Caregiver/Family have Issues with Lodging/Transportation while Pt is in Rehab?: No  Goals/Additional Needs Patient/Family Goal for Rehab: PT/OT mod I and supervision goals Expected length of stay: 12-16 days Cultural Considerations: [place], Jewish, no pork, understands English if speak slowly.  Recommend interpreter if we can get one as needed. Dietary Needs: Heart diet, thin liquids Equipment Needs: TBD Pt/Family Agrees to Admission and willing to participate: Yes Program Orientation Provided & Reviewed with Pt/Caregiver Including Roles  & Responsibilities: Yes  Decrease burden of Care through IP rehab admission: N/A  Possible need for SNF placement upon discharge: Not anticipated.  Family prefers to go home with caregivers after rehab stay.  Patient Condition: This patient's medical and functional status has changed since the consult dated: 08/23/16 in which the Rehabilitation Physician determined and documented that the patient's condition is appropriate for intensive rehabilitative care in an inpatient rehabilitation facility. See "History of Present Illness" (above) for medical update. Functional changes are: Currently requiring minguard to ambulate 170 feet RW. Patient's medical and functional status update has been discussed with the Rehabilitation physician and patient remains appropriate for inpatient rehabilitation. Will admit to inpatient rehab tomorrow.  Preadmission Screen Completed By:  Retta Diones, 08/27/2016 3:59 PM ______________________________________________________________________   Discussed status with Dr. Naaman Plummer on 08/27/16 at 24 and received telephone approval for admission  tomorrow.  Admission Coordinator:  Retta Diones, time [address] 08/27/16

## 2016-08-26 NOTE — Consult Note (Signed)
Chief Complaint: Patient was seen in consultation today for Thoracic 10/11 vertebroplasty/kyphoplasty Chief Complaint  Patient presents with  . Trauma   at the request of Dr Garwin Brothers  Referring Physician(s): Dr Garwin Brothers  Supervising Physician: Luanne Bras  Patient Status: [facility] - In-pt  History of Present Illness: [name] is a 83 y.o. male   Pt fell from tree prior to admission through ED 08/13/16 Has suffered rib fractures; T 10 and 11 fractures; right pleural effusion Continues to have back pain Pain meds minimal help Was seen by Neurosurgery and deemed not surgical candidate for spinal surgery/fixation Request made for evaluation for possible vertebroplasty/kyphoplasty per Dr Garwin Brothers Imaging reviewed with Dr Charlett Lango procedure of T10/11 VP/KP    Past Medical History:  Diagnosis Date  . At risk for sleep apnea    STOP-BANG= 4    SENT TO PCP 04-04-2014  . BPH (benign prostatic hypertrophy)   . Depression   . Diverticulosis of colon   . GERD (gastroesophageal reflux disease)   . Gilbert's syndrome   . Headache    migraines  . Hearing loss   . Heart murmur    "Calcified aortic valve"  . Hepatitis    Hep A as a child  . History of gastroenteritis   . History of kidney stones   . History of syncope    2002--  vasovagal  . HOH (hard of hearing)    refuses to wear his Hearing Aid  . Hypertension   . Inguinal hernia    right  . Internal hemorrhoid, bleeding   . Moderate aortic stenosis    a. Last echo 11/2013, 06/2014.  Marland Kitchen Nocturia   . Normal coronary arteries    a. Cath 07/2010: normal coronaries. Felt to have noncardiac CP/SOB at that time possibly r/t anxiety.  . RA (rheumatoid arthritis) ([place])    bilateral hands/ wrist--  seronegative  . Rotator cuff syndrome of right shoulder 08/01/2013  . Shortness of breath dyspnea    with exertion  . Stone, kidney   . Thyroid cyst   . Thyroid nodule    noted 11-2009  . Wears dentures     Past  Surgical History:  Procedure Laterality Date  . CARDIAC CATHETERIZATION  07-19-2010  dr Tressia Miners turner   normal coronary arteries,  ef 60%,  moderate aortic stenosis- gradient 65mmHg, normal right heart pressure  . CARDIOVASCULAR STRESS TEST  05/ 2011   dr Marlou Porch   normal low risk perfusion study  . CATARACT EXTRACTION W/ INTRAOCULAR LENS  IMPLANT, BILATERAL  2013  . COLONOSCOPY  2010  approx  . THYROID LOBECTOMY Right 05/05/2015   Procedure: RIGHT THYROID LOBECTOMY;  Surgeon: Armandina Gemma, MD;  Location: [place];  Service: General;  Laterality: Right;  . TRANSANAL HEMORRHOIDAL DEARTERIALIZATION N/A 04/09/2014   Procedure: TRANSANAL HEMORRHOIDAL DEARTERIALIZATION OF INTERNAL HEMORROIDS;  Surgeon: Leighton Ruff, MD;  Location: [place];  Service: General;  Laterality: N/A;  . TRANSTHORACIC ECHOCARDIOGRAM  11-26-2013   moderate focal basal and mild LVH/  ef 05-39%/  grade I diastolic dysfunction/ mild LAE/  moderate calcification with stenosis AV with mild regurg,  gradients 35 abd 58 mmHg /  mild TR    Allergies: Ativan [lorazepam]  Medications: Prior to Admission medications   Medication Sig Start Date End Date Taking? Authorizing Provider  aspirin EC 81 MG tablet Take 81 mg by mouth daily.   Yes Historical Provider, MD  DIOVAN 80 MG tablet Take  80 mg by mouth every morning.  05/19/13  Yes Historical Provider, MD  escitalopram (LEXAPRO) 20 MG tablet Take 20 mg by mouth daily.  07/02/14  Yes Historical Provider, MD  folic acid (FOLVITE) 1 MG tablet Take 1 mg by mouth daily.  01/27/15  Yes Historical Provider, MD  hydrochlorothiazide (MICROZIDE) 12.5 MG capsule Take 12.5 mg by mouth daily. 06/04/13  Yes Historical Provider, MD  HYDROcodone-acetaminophen (NORCO/VICODIN) 5-325 MG tablet Take 1-2 tablets by mouth every 4 (four) hours as needed for moderate pain. 05/06/15  Yes Armandina Gemma, MD  ibuprofen (ADVIL,MOTRIN) 800 MG tablet Take 800 mg by mouth every 8 (eight) hours as needed  for moderate pain.  06/02/13  Yes Historical Provider, MD  methotrexate (RHEUMATREX) 2.5 MG tablet Take 22.5 mg by mouth once a week. TAKE ON SUNDAYS 07/13/13  Yes Historical Provider, MD  omeprazole (PRILOSEC) 20 MG capsule TAKE 1 CAPSULE(20 MG) BY MOUTH DAILY 06/15/16  Yes Jerline Pain, MD  simvastatin (ZOCOR) 20 MG tablet TAKE 1 TABLET BY MOUTH EVERY NIGHT AT BEDTIME 05/06/14  Yes Jerline Pain, MD  acetaminophen (TYLENOL) 325 MG tablet Take 2 tablets (650 mg total) by mouth every 6 (six) hours. 08/19/16   Kalman Drape, PA  dutasteride (AVODART) 0.5 MG capsule Take 0.5 mg by mouth daily. 12/30/14   Historical Provider, MD  oxyCODONE (OXY IR/ROXICODONE) 5 MG immediate release tablet Take 1 tablet (5 mg total) by mouth every 6 (six) hours as needed for moderate pain or severe pain. 08/19/16   Kalman Drape, PA  traMADol (ULTRAM) 50 MG tablet Take 1 tablet (50 mg total) by mouth every 12 (twelve) hours as needed. 08/19/16   Kalman Drape, PA     Family History  Problem Relation Age of Onset  . Pulmonary embolism Mother 66    caused by complications of surgery  . CVA Father   . Diabetes Brother   . Diabetes Brother   . Breast cancer Sister     Social History   Social History  . Marital status: Widowed    Spouse name: N/A  . Number of children: N/A  . Years of education: N/A   Social History Main Topics  . Smoking status: Former Smoker    Years: 40.00    Types: Cigarettes    Start date: 06/13/1973    Quit date: 04/03/1981  . Smokeless tobacco: Never Used  . Alcohol use Yes     Comment: occasional  . Drug use: No  . Sexual activity: Not Asked   Other Topics Concern  . None   Social History Narrative  . None    Review of Systems: A 12 point ROS discussed and pertinent positives are indicated in the HPI above.  All other systems are negative.  Review of Systems  Constitutional: Positive for activity change. Negative for appetite change, diaphoresis, fatigue and fever.    Respiratory: Negative for shortness of breath.   Gastrointestinal: Negative for abdominal pain.  Musculoskeletal: Positive for back pain and gait problem.  Neurological: Negative for weakness.  Psychiatric/Behavioral: Negative for behavioral problems and confusion.    Vital Signs: BP 136/61 (BP Location: Right Arm)   Pulse 62   Temp 98.1 F (36.7 C) (Oral)   Resp 16   Ht 5\' 9"  (1.753 m)   Wt 180 lb 11.2 oz (82 kg)   SpO2 95%   BMI 26.68 kg/m   Physical Exam  Constitutional: He is oriented to person, place, and time.  Cardiovascular: Normal rate and regular rhythm.   Pulmonary/Chest: Effort normal and breath sounds normal.  Abdominal: Soft. Bowel sounds are normal.  Musculoskeletal: Normal range of motion.  Upper to middle back pain to palpation  Neurological: He is alert and oriented to person, place, and time.  Skin: Skin is warm and dry.  Psychiatric: He has a normal mood and affect. His behavior is normal. Thought content normal.  Consented with Dtr Dora via phone She is POA. Pt seems appropriate but speaks little [place]   Nursing note and vitals reviewed.   Mallampati Score:  MD Evaluation Airway: WNL Heart: WNL Abdomen: WNL Chest/ Lungs: WNL ASA  Classification: 3 Mallampati/Airway Score: Two  Imaging: Ct Abdomen Pelvis Wo Contrast  Result Date: 08/21/2016 CLINICAL DATA:  RECENT TRAUMA. FELL FROM TREE. RIGHT-SIDED RIB FRACTURES AND PNEUMOTHORAX. EXAM: CT ABDOMEN AND PELVIS WITHOUT CONTRAST TECHNIQUE: Multidetector CT imaging of the abdomen and pelvis was performed following the standard protocol without IV contrast. COMPARISON:  CT of the abdomen and pelvis 08/13/2016. Abdominal radiographs 08/21/2016. FINDINGS: Lower chest: Bilateral pleural effusions are present, right greater than left. Pneumomediastinum is again seen. Dense subcutaneous emphysema coronary artery calcifications are evident. Subcutaneous emphysema is worse right than left. Hepatobiliary:  Punctate calcifications are again noted in the liver. No focal liver lesion or trauma is evident. The common bile duct and gallbladder are within normal limits. Pancreas: Unremarkable. No pancreatic ductal dilatation or surrounding inflammatory changes. Spleen: Normal in size without focal abnormality. Adrenals/Urinary Tract: The adrenal glands are normal bilaterally. Bilateral renal cysts are again noted, right greater than left. Ureters are within normal limits. The urinary bladder is unremarkable. Stomach/Bowel: Stomach and duodenum are within normal limits. Small bowel is unremarkable. Bowel is no longer herniated into the right inguinal canal. The appendix is visualized and normal. The ascending and transverse colon are normal. The descending and sigmoid colon are within normal limits. Vascular/Lymphatic: Atherosclerotic calcifications are again seen without aneurysm. There is no significant adenopathy. Reproductive: Prostate is unremarkable. Other: High-density fluid layering within the abdomen and pelvis compatible with pneumoperitoneum. No discrete source is identified. Blood extends into the right inguinal canal along with fat. Musculoskeletal: Multilevel degenerative changes are again noted in the lower lumbar spine. Anterior costochondral fractures are again seen. No new fractures are evident. IMPRESSION: 1. Pneumoperitoneum likely accounts for the drop in hemoglobin. 2. No discrete etiology of the pneumoperitoneum is evident. 3. Pneumomediastinum. 4. Extensive subcutaneous emphysema is most prominent at the right chest and extends into the right abdomen. 5. Large right inguinal hernia contains fat and fluid, likely blood. Bowel is no longer present within hernia. 6. Atherosclerosis. 7. Degenerative changes of the lower lumbar spine. 8. Anterior costochondral fractures on the right. These results were called by telephone at the time of interpretation on 08/21/2016 at 8:44 pm to Dr. Stark Klein, who  verbally acknowledged these results. Electronically Signed   By: San Morelle M.D.   On: 08/21/2016 20:53   Dg Chest 2 View  Result Date: 08/23/2016 CLINICAL DATA:  Shortness of Breath EXAM: CHEST  2 VIEW COMPARISON:  08/20/2016 FINDINGS: Subcutaneous emphysema again noted in the right chest wall. No visible pneumothorax. Continued small to moderate right effusion with right lung airspace disease. No confluent opacity on the left. Heart is upper limits normal in size. IMPRESSION: Small moderate right pleural effusion with right lung airspace disease, slightly increasing since prior study. Stable right chest wall subcutaneous emphysema.  No pneumothorax. Electronically Signed   By: Lennette Bihari  [place] M.D.   On: 08/23/2016 12:36   Dg Chest 2 View  Result Date: 08/20/2016 CLINICAL DATA:  Chest pain and diaphoresis. History of hypertension, aortic stenosis, ileus. Former smoker. EXAM: CHEST  2 VIEW COMPARISON:  Chest x-rays dated 08/15/2016 and 04/29/2015. Comparison also made to chest CT of 08/13/2016. FINDINGS: Study is hypoinspiratory with crowding of the perihilar and bibasilar bronchovascular markings. Suspect at least mild edema and/or atelectasis within the right lower lung, versus asymmetrically prominent hypoinspiratory changes. Right pleural effusion, small to moderate in size. Heart size and mediastinal contours are stable. Again noted is subcutaneous emphysema overlying the right chest wall, similar to previous exams. No pneumothorax seen. IMPRESSION: 1. Low lung volumes. Right pleural effusion, small to moderate in size. Suspect additional edema and/or atelectasis at the right lung base. 2. Subcutaneous emphysema overlying the right chest wall, similar to previous exams. 3. No pneumothorax seen, possibly resolved since earlier exams, possibly obscured by the overlying subcutaneous emphysema. Electronically Signed   By: Franki Cabot M.D.   On: 08/20/2016 11:59   Dg Abd 1 View  Result Date:  08/21/2016 CLINICAL DATA:  Ileus, NG tube placement EXAM: ABDOMEN - 1 VIEW COMPARISON:  08/20/2016 FINDINGS: A gastric tube tip and side-port are seen in the proximal body of the stomach. Contrast filled large bowel is noted with filling of the appendix. Dilated small bowel loops are present within the abdomen and pelvis. Findings are in keeping with an ileus given persistent oral contrast within large bowel. IMPRESSION: Gastric tube in the proximal stomach. Bowel gas pattern suggests an ileus. Electronically Signed   By: Ashley Royalty M.D.   On: 08/21/2016 03:34   Ct Head Wo Contrast  Result Date: 08/13/2016 CLINICAL DATA:  Fall from 25 feet, chest pain and lower back pain. EXAM: CT HEAD WITHOUT CONTRAST CT CERVICAL SPINE WITHOUT CONTRAST TECHNIQUE: Multidetector CT imaging of the head and cervical spine was performed following the standard protocol without intravenous contrast. Multiplanar CT image reconstructions of the cervical spine were also generated. COMPARISON:  None. FINDINGS: CT HEAD FINDINGS Brain: There is mild generalized parenchymal atrophy with commensurate dilatation of the ventricles and sulci. Mild chronic small vessel ischemic change noted within the periventricular white matter regions bilaterally. There is no mass, hemorrhage, edema or other evidence of acute parenchymal abnormality. No extra-axial hemorrhage. Vascular: There are chronic calcified atherosclerotic changes of the large vessels at the skull base. No unexpected hyperdense vessel. Skull: Normal. Negative for fracture or focal lesion. Sinuses/Orbits: No acute finding. Other: None. CT CERVICAL SPINE FINDINGS Alignment: Alignment is normal.  No subluxation. Skull base and vertebrae: No fracture line or displaced fracture fragment identified. Facet joints appear intact and normally aligned throughout. Soft tissues and spinal canal: No prevertebral fluid or swelling. No visible canal hematoma. Disc levels: Degenerative changes  throughout the cervical spine, mild to moderate in degree, most pronounced at the C6-7 level, no more than mild central canal stenosis appreciated at any level. Upper chest: Unremarkable. Atherosclerotic changes of the aortic arch. Surgical changes at the level of the right thyroid lobe. Other: None. IMPRESSION: 1. No acute intracranial abnormality. No intracranial mass, hemorrhage or edema. No skull fracture. Mild chronic small vessel ischemic change within the white matter. 2. No fracture or acute subluxation within the cervical spine. Degenerative changes of the cervical spine, as detailed above. 3. Aortic atherosclerosis. Electronically Signed   By: Franki Cabot M.D.   On: 08/13/2016 16:25   Ct Chest Wo Contrast  Result Date:  08/24/2016 CLINICAL DATA:  [place] from tree several days ago. Pneumomediastinum and pneumothorax previously noted. Subcutaneous gas previously noted. Short of breath following thoracentesis EXAM: CT CHEST WITHOUT CONTRAST TECHNIQUE: Multidetector CT imaging of the chest was performed following the standard protocol without IV contrast. COMPARISON:  CT chest 08/13/2016 chest radiograph 08/23/2016 FINDINGS: Cardiovascular: Again demonstrated small amount of gas in the anterior mediastinum and mediastinal fat anterior to the heart. No pericardial fluid. Airways appear normal. Esophagus normal. Mediastinum/Nodes: No axillary supraclavicular adenopathy. There is subcutaneous gas along the RIGHT chest wall extending to the RIGHT neck and to lesser degree the LEFT neck and left anterior chest. Lungs/Pleura: No pneumothorax present. There is bibasilar atelectasis pleural effusions. Upper Abdomen: Limited view of the upper abdomen demonstrates no peritoneal fluid. Adrenal glands are normal. Musculoskeletal: Fracture the posterior eighth rib, seventh rib. There are fractures noted in the bilateral pedicles at T10 (image 64, series 8 and image 57, series 8). Fractures are mildly distracted.  Fracture extends the posterior wall of the T2 vertebral body. No loss of vertebral body height. Probable fracture the superior endplate of T65. No loss vertebral body height. No paraspinal or epidural hematoma evident. IMPRESSION: 1. No pneumothorax. 2. Bilateral small pleural effusions and medial basilar atelectasis. 3. Subcutaneous emphysema and pneumomediastinum not changed. 4. Posterior RIGHT seventh and eighth rib fractures. 5. BILATERAL SUBACUTE PEDICLE FRACTURES AT T10. These are UNSTABLE fractures. Recommend nonemergent neurosurgical consultation. 6. Probable superior endplate fracture Y65. Findings conveyed toFloor nurse, [place] on 08/24/2016  at18:36. Electronically Signed   By: Suzy Bouchard M.D.   On: 08/24/2016 18:44   Ct Chest W Contrast  Result Date: 08/13/2016 CLINICAL DATA:  Level 2 trauma. Fell 25 feet from a tree. Unsure of loss of consciousness. Chest pain a lower back pain. EXAM: CT CHEST, ABDOMEN, AND PELVIS WITH CONTRAST TECHNIQUE: Multidetector CT imaging of the chest, abdomen and pelvis was performed following the standard protocol during bolus administration of intravenous contrast. CONTRAST:  22mL ISOVUE-300 IOPAMIDOL (ISOVUE-300) INJECTION 61% COMPARISON:  08/13/2016 plain films, CT abdomen and pelvis on 08/19/2011 FINDINGS: CT CHEST FINDINGS Cardiovascular: Heart size is normal. There is three-vessel coronary artery disease. No pericardial effusion. Aortic valve calcification. There is atherosclerotic calcification of the thoracic aorta not associated with aneurysm or dissection. Mediastinum/Nodes: Small anterior mediastinal hematoma at the level of the pericardial fat pad. Status post right thyroidectomy. No mediastinal or hilar adenopathy. Lungs/Pleura: There is a right-sided pneumothorax, estimated to be 5% of lung volume, seen primarily along the medial aspect of the lung. Right chest wall subcutaneous gas is present primarily along the anterior aspect of the chest. There is a  small right pleural effusion. Minimal subsegmental atelectasis identified at the right lung base. Within the right lower lobe a pulmonary nodule measures 6 mm on image 92 of series 5. Within the left upper lobe a nodule measures 6 mm on image 50 of series 5. Musculoskeletal: Significant ecchymosis and edema of the anterior chest wall. There are fractures of at least 3 right costochondral junctions 4, 5, and 6. However the sternum is intact. Changes of diffuse idiopathic skeletal hyperostosis of the thoracic spine. No acute thoracic vertebral fractures. No acute displaced rib fractures. CT ABDOMEN PELVIS FINDINGS Hepatobiliary: Hepatic granulomata are present. The liver is homogeneous otherwise. Gallbladder is present. Pancreas: Unremarkable. No pancreatic ductal dilatation or surrounding inflammatory changes. Spleen: No splenic injury or perisplenic hematoma. Adrenals/Urinary Tract: The adrenal glands are normal in appearance. There are bilateral renal cysts. No hydronephrosis or  evidence for acute renal injury . Stomach/Bowel: The stomach is normal in appearance. The appendix is well seen and has a normal appearance. Nondilated loops of small bowel are identified within a right inguinal hernia, showing no evidence for obstruction or compromise. Extensive colonic diverticulosis, primarily involving the sigmoid colon. No associated inflammatory changes. Vascular/Lymphatic: Atherosclerotic calcification of the abdominal aorta and its branches. No retroperitoneal or mesenteric adenopathy. Reproductive: Prostate gland is prominent and projects into the inferior aspect of the bladder. Other: Right inguinal hernia containing mesenteric fat and nondilated loops of small bowel. Musculoskeletal: Pelvis is intact.  No vertebral fractures. IMPRESSION: 1. Right pneumothorax, 5% of lung volume. 2. Right chest wall subcutaneous emphysema. 3. Right costochondral fractures. 4. Small anterior mediastinal hematoma. 5. No evidence for  great vessel injury or acute intra-abdominal injury. 6. Bilateral renal cysts. 7. Right inguinal hernia contains nondilated loops of small bowel. 8. Colonic diverticulosis. 9. Prostatic hypertrophy. Electronically Signed   By: Nolon Nations M.D.   On: 08/13/2016 17:17   Ct Cervical Spine Wo Contrast  Result Date: 08/13/2016 CLINICAL DATA:  Fall from 25 feet, chest pain and lower back pain. EXAM: CT HEAD WITHOUT CONTRAST CT CERVICAL SPINE WITHOUT CONTRAST TECHNIQUE: Multidetector CT imaging of the head and cervical spine was performed following the standard protocol without intravenous contrast. Multiplanar CT image reconstructions of the cervical spine were also generated. COMPARISON:  None. FINDINGS: CT HEAD FINDINGS Brain: There is mild generalized parenchymal atrophy with commensurate dilatation of the ventricles and sulci. Mild chronic small vessel ischemic change noted within the periventricular white matter regions bilaterally. There is no mass, hemorrhage, edema or other evidence of acute parenchymal abnormality. No extra-axial hemorrhage. Vascular: There are chronic calcified atherosclerotic changes of the large vessels at the skull base. No unexpected hyperdense vessel. Skull: Normal. Negative for fracture or focal lesion. Sinuses/Orbits: No acute finding. Other: None. CT CERVICAL SPINE FINDINGS Alignment: Alignment is normal.  No subluxation. Skull base and vertebrae: No fracture line or displaced fracture fragment identified. Facet joints appear intact and normally aligned throughout. Soft tissues and spinal canal: No prevertebral fluid or swelling. No visible canal hematoma. Disc levels: Degenerative changes throughout the cervical spine, mild to moderate in degree, most pronounced at the C6-7 level, no more than mild central canal stenosis appreciated at any level. Upper chest: Unremarkable. Atherosclerotic changes of the aortic arch. Surgical changes at the level of the right thyroid lobe. Other:  None. IMPRESSION: 1. No acute intracranial abnormality. No intracranial mass, hemorrhage or edema. No skull fracture. Mild chronic small vessel ischemic change within the white matter. 2. No fracture or acute subluxation within the cervical spine. Degenerative changes of the cervical spine, as detailed above. 3. Aortic atherosclerosis. Electronically Signed   By: Franki Cabot M.D.   On: 08/13/2016 16:25   Mr Thoracic Spine Wo Contrast  Result Date: 08/24/2016 CLINICAL DATA:  Follow-up T10 unstable fracture incidentally noted during CT-guided thoracentesis. EXAM: MRI THORACIC SPINE WITHOUT CONTRAST TECHNIQUE: Multiplanar, multisequence MR imaging of the thoracic spine was performed. No intravenous contrast was administered. COMPARISON:  CT chest August 24, 2016 at 1731 hours FINDINGS: ALIGNMENT: Maintenance of the thoracic kyphosis. No malalignment. VERTEBRAE/DISCS: Low T1 and bright STIR signal within the T10 and T11 vertebral bodies extending into the posterior elements. Linear low T1 through T10 posterior elements corresponding to known fracture. No retropulsed bony fragments. Mild (less than 20%) T11 vertebral body height loss with bright STIR signal within the T10-11 disc, to lesser extent at T9-10.  Multilevel mild chronic discogenic endplate changes. Intervertebral disc heights generally preserved with slight desiccation and mineralization is evidenced on today's CT. CORD: Thoracic spinal cord is normal morphology and signal characteristics to the level of the conus medullaris which terminates at T12-L1. PREVERTEBRAL AND PARASPINAL SOFT TISSUES: T2 bright signal within the anterior longitudinal ligament T10-11, within the interspinous and supraspinous ligaments at T10-11. Mild bright STIR paraspinal muscle strain T10-11 and more caudal levels. Dependent pleural effusion and consolidation partially imaged. T2 bright cysts in the kidneys. DISC LEVELS: No disc bulge, canal stenosis or neural foraminal  narrowing any level. IMPRESSION: Acute appearing T10-3 column fracture without height loss. Mild T11 acute compression fracture and edema extending to the posterior elements. Focally disrupted T10-11 ligaments. Low-grade lower thoracic paraspinal muscle strain. No neurocompression. Electronically Signed   By: Elon Alas M.D.   On: 08/24/2016 23:50   Ct Abdomen Pelvis W Contrast  Result Date: 08/13/2016 CLINICAL DATA:  Level 2 trauma. Fell 25 feet from a tree. Unsure of loss of consciousness. Chest pain a lower back pain. EXAM: CT CHEST, ABDOMEN, AND PELVIS WITH CONTRAST TECHNIQUE: Multidetector CT imaging of the chest, abdomen and pelvis was performed following the standard protocol during bolus administration of intravenous contrast. CONTRAST:  88mL ISOVUE-300 IOPAMIDOL (ISOVUE-300) INJECTION 61% COMPARISON:  08/13/2016 plain films, CT abdomen and pelvis on 08/19/2011 FINDINGS: CT CHEST FINDINGS Cardiovascular: Heart size is normal. There is three-vessel coronary artery disease. No pericardial effusion. Aortic valve calcification. There is atherosclerotic calcification of the thoracic aorta not associated with aneurysm or dissection. Mediastinum/Nodes: Small anterior mediastinal hematoma at the level of the pericardial fat pad. Status post right thyroidectomy. No mediastinal or hilar adenopathy. Lungs/Pleura: There is a right-sided pneumothorax, estimated to be 5% of lung volume, seen primarily along the medial aspect of the lung. Right chest wall subcutaneous gas is present primarily along the anterior aspect of the chest. There is a small right pleural effusion. Minimal subsegmental atelectasis identified at the right lung base. Within the right lower lobe a pulmonary nodule measures 6 mm on image 92 of series 5. Within the left upper lobe a nodule measures 6 mm on image 50 of series 5. Musculoskeletal: Significant ecchymosis and edema of the anterior chest wall. There are fractures of at least 3 right  costochondral junctions 4, 5, and 6. However the sternum is intact. Changes of diffuse idiopathic skeletal hyperostosis of the thoracic spine. No acute thoracic vertebral fractures. No acute displaced rib fractures. CT ABDOMEN PELVIS FINDINGS Hepatobiliary: Hepatic granulomata are present. The liver is homogeneous otherwise. Gallbladder is present. Pancreas: Unremarkable. No pancreatic ductal dilatation or surrounding inflammatory changes. Spleen: No splenic injury or perisplenic hematoma. Adrenals/Urinary Tract: The adrenal glands are normal in appearance. There are bilateral renal cysts. No hydronephrosis or evidence for acute renal injury . Stomach/Bowel: The stomach is normal in appearance. The appendix is well seen and has a normal appearance. Nondilated loops of small bowel are identified within a right inguinal hernia, showing no evidence for obstruction or compromise. Extensive colonic diverticulosis, primarily involving the sigmoid colon. No associated inflammatory changes. Vascular/Lymphatic: Atherosclerotic calcification of the abdominal aorta and its branches. No retroperitoneal or mesenteric adenopathy. Reproductive: Prostate gland is prominent and projects into the inferior aspect of the bladder. Other: Right inguinal hernia containing mesenteric fat and nondilated loops of small bowel. Musculoskeletal: Pelvis is intact.  No vertebral fractures. IMPRESSION: 1. Right pneumothorax, 5% of lung volume. 2. Right chest wall subcutaneous emphysema. 3. Right costochondral fractures. 4. Small  anterior mediastinal hematoma. 5. No evidence for great vessel injury or acute intra-abdominal injury. 6. Bilateral renal cysts. 7. Right inguinal hernia contains nondilated loops of small bowel. 8. Colonic diverticulosis. 9. Prostatic hypertrophy. Electronically Signed   By: Nolon Nations M.D.   On: 08/13/2016 17:17   Dg Pelvis Portable  Result Date: 08/13/2016 CLINICAL DATA:  Fall from tree 25 feet. EXAM: PORTABLE  PELVIS 1-2 VIEWS COMPARISON:  None. FINDINGS: There is no evidence of pelvic fracture or diastasis. No pelvic bone lesions are seen. IMPRESSION: Negative. Electronically Signed   By: Franki Cabot M.D.   On: 08/13/2016 16:21   Dg Chest Port 1 View  Result Date: 08/25/2016 CLINICAL DATA:  83 year old male status post fall with chest trauma including T10 Chance type fracture, right rib fractures, pleural effusions, pneumomediastinum and subcutaneous gas. EXAM: PORTABLE CHEST 1 VIEW COMPARISON:  Chest CT 08/24/2016 and earlier. FINDINGS: Portable AP semi upright view at 0645 hours. Stable mild right chest wall subcutaneous gas. Improved lung volumes since 08/23/2016. Mediastinal contours remain normal. No pneumothorax. No pulmonary edema. No pleural effusion is evident. No confluent pulmonary opacity. Stable right thoracic inlet surgical clips. Stable visualized osseous structures. IMPRESSION: 1. Improved lung volumes and ventilation since 08/23/2016. Pleural effusions are no longer evident, no confluent pulmonary opacity. 2. T10 Chance type fracture and right rib fractures better demonstrated on yesterday CT and MRI. Electronically Signed   By: Genevie Ann M.D.   On: 08/25/2016 07:34   Dg Chest Port 1 View  Result Date: 08/15/2016 CLINICAL DATA:  Pneumothorax EXAM: PORTABLE CHEST 1 VIEW COMPARISON:  08/14/2016 FINDINGS: Normal heart size. Gas along the left heart border are favored represent pneumomediastinum is stable. Emphysema throughout the soft tissues of the chest wall and right supraclavicular region are stable. Lungs are under aerated and grossly clear. There is gas along the upper right heart border but no definite pneumothorax. IMPRESSION: Stable pneumomediastinum.  No definite pneumothorax. Electronically Signed   By: Marybelle Killings M.D.   On: 08/15/2016 07:57   Dg Chest Port 1 View  Result Date: 08/14/2016 CLINICAL DATA:  Pneumothorax EXAM: PORTABLE CHEST 1 VIEW COMPARISON:  08/13/2016 FINDINGS:  Extensive right chest wall emphysema. Possible residual medial right pneumothorax adjacent to the pericardial fat pad. Pneumomediastinum is present. Lungs are otherwise clear. IMPRESSION: Interval development of pneumomediastinum. Significant right chest wall emphysema. Possible small anterior right pneumothorax. Electronically Signed   By: Nolon Nations M.D.   On: 08/14/2016 08:23   Dg Chest Port 1 View  Result Date: 08/13/2016 CLINICAL DATA:  Level 2 trauma. Patient fell from a tree 25 ft EXAM: PORTABLE CHEST 1 VIEW COMPARISON:  04/29/2015 FINDINGS: Cardiomediastinal silhouette is accentuated by the AP portable position of the patient. CT of the chest is performed on the same day. There is subcutaneous gas in the right chest wall. No definite pneumothorax identified on plain film. No acute displaced fractures. IMPRESSION: 1. Right chest wall subcutaneous gas. 2. No definite pneumothorax. 3. Mediastinal contour accentuated by the portable exam. CT of the chest will be performed later today. Electronically Signed   By: Nolon Nations M.D.   On: 08/13/2016 16:19   Dg Abd 2 Views  Result Date: 08/21/2016 CLINICAL DATA:  Ileus EXAM: ABDOMEN - 2 VIEW COMPARISON:  08/21/2016 FINDINGS: Contrast material remains throughout the colon similar to that seen on the prior exam from a prior CT study. The nasogastric catheter has been removed in the interval. Right-sided pleural effusion is again identified. No obstructive  changes are seen. No free air is noted. Degenerative changes of the lumbar spine are seen. IMPRESSION: No acute abnormality is noted aside from a right pleural effusion. Stable contrast throughout the colon. Electronically Signed   By: Inez Catalina M.D.   On: 08/21/2016 08:46   Dg Abd Portable 1v  Result Date: 08/20/2016 CLINICAL DATA:  Nausea and abdominal pain EXAM: PORTABLE ABDOMEN - 1 VIEW COMPARISON:  Abdominal radiograph 08/19/2016 FINDINGS: There is contrast material within the ascending  colon and appendix. There is a large amount of stool throughout the remainder of the colon. Gas-filled loops of small bowel are again seen. IMPRESSION: Large amount of stool in the colon with contrast material opacifying the ascending colon. Multiple gas-filled loops of small bowel. Findings are again suggestive of adynamic ileus. Electronically Signed   By: Ulyses Jarred M.D.   On: 08/20/2016 20:18   Dg Abd Portable 1v  Result Date: 08/19/2016 CLINICAL DATA:  Adynamic ileus EXAM: PORTABLE ABDOMEN - 1 VIEW COMPARISON:  CT scan August 13, 2016 FINDINGS: Contrast is seen in the cecum, proximal ascending colon, and stomach. Air-filled mildly prominent loops of large and small bowel are seen, possibly representing a mild ileus. No definitive obstruction. IMPRESSION: Probable mild ileus. Electronically Signed   By: Dorise Bullion III M.D   On: 08/19/2016 16:40   Dg Swallowing Func-speech Pathology  Result Date: 08/19/2016 Carterm5 Objective Swallowing Evaluation: Type of Study: MBS-Modified Barium Swallow Study Patient Details Name: Babatunde Seago MRN: [id number] Date of Birth: 1928/05/03 Today's Date: 08/19/2016 Time: SLP Start Time (ACUTE ONLY): 1040-SLP Stop Time (ACUTE ONLY): 1104 SLP Time Calculation (min) (ACUTE ONLY): 24 min Past Medical History: Past Medical History: Diagnosis Date . At risk for sleep apnea   STOP-BANG= 4    SENT TO PCP 04-04-2014 . BPH (benign prostatic hypertrophy)  . Depression  . Diverticulosis of colon  . GERD (gastroesophageal reflux disease)  . Gilbert's syndrome  . Headache   migraines . Hearing loss  . Heart murmur   "Calcified aortic valve" . Hepatitis   Hep A as a child . History of gastroenteritis  . History of kidney stones  . History of syncope   2002--  vasovagal . HOH (hard of hearing)   refuses to wear his Hearing Aid . Hypertension  . Inguinal hernia   right . Internal hemorrhoid, bleeding  . Moderate aortic stenosis   a. Last echo 11/2013, 06/2014. Marland Kitchen Nocturia  . Normal coronary  arteries   a. Cath 07/2010: normal coronaries. Felt to have noncardiac CP/SOB at that time possibly r/t anxiety. . RA (rheumatoid arthritis) ([place])   bilateral hands/ wrist--  seronegative . Rotator cuff syndrome of right shoulder 08/01/2013 . Shortness of breath dyspnea   with exertion . Stone, kidney  . Thyroid cyst  . Thyroid nodule   noted 11-2009 . Wears dentures  Past Surgical History: Past Surgical History: Procedure Laterality Date . CARDIAC CATHETERIZATION  07-19-2010  dr Tressia Miners turner  normal coronary arteries,  ef 60%,  moderate aortic stenosis- gradient 38mmHg, normal right heart pressure . CARDIOVASCULAR STRESS TEST  05/ 2011   dr Marlou Porch  normal low risk perfusion study . CATARACT EXTRACTION W/ INTRAOCULAR LENS  IMPLANT, BILATERAL  2013 . COLONOSCOPY  2010  approx . THYROID LOBECTOMY Right 05/05/2015  Procedure: RIGHT THYROID LOBECTOMY;  Surgeon: Armandina Gemma, MD;  Location: [place];  Service: General;  Laterality: Right; . TRANSANAL HEMORRHOIDAL DEARTERIALIZATION N/A 04/09/2014  Procedure: TRANSANAL HEMORRHOIDAL DEARTERIALIZATION OF INTERNAL HEMORROIDS;  Surgeon:  Leighton Ruff, MD;  Location: [facility];  Service: General;  Laterality: N/A; . TRANSTHORACIC ECHOCARDIOGRAM  11-26-2013  moderate focal basal and mild LVH/  ef 37-16%/  grade I diastolic dysfunction/ mild LAE/  moderate calcification with stenosis AV with mild regurg,  gradients 35 abd 58 mmHg /  mild TR HPI: 88yom s/p fall from tree. Through his daughter he climbed about 25 feet then lost his stand. While trying to climb down he fell from about 3 meters. Sustained right rib fx, pneumothorax, costochondral fx, small anterior med hematoma. Ct chest on arrival shows There is a right-sided pneumothorax, estimated to be 5% of lung volume, seen primarily along the medial aspect of the lung. Right chest wall subcutaneous gas is present primarily along the anterior aspect of the chest. There is a small right pleural effusion. Minimal  subsegmental atelectasis identified at the right lung base. Within the right lower lobe a pulmonary nodule measures 6 mm. Family reproted to MD that pt does not drink much due to fear of choking, frequent choking. UGI on 2007 showed dysmotility, hypertrophied cricopharyngeus muscle.  No Data Recorded Assessment / Plan / Recommendation CHL IP CLINICAL IMPRESSIONS 08/19/2016 Clinical Impression Mr. Dorey presents with a suspected esophageal dysphagia (no penetration/aspiration during this study). Oral phase impairments included premature spillage with thin liquids via straw. Initial swallow of thin consecutive cup sips resulted in sudden coughing, although no penetrates/aspirates on fluoro. Vallecular and pyriform sinus residue throughout the exam, however pt was able to clear residuals with reflexive second swallows. Mastication of solids was timely with no oral impairments and mild vallecular residue. The barium pill became briefly lodged at the level of the CP segment but then cleared into the esophagus, although the pill remained stagnant in the esophagus and noted backflow during esophageal scan (MBS does not diagnose below the level of the UES). Educated pt and daughter re: results of assessment and esophageal precautions (remain seated upright after meals, follow solids with liquids, possible GI consult). No further ST needed.  SLP Visit Diagnosis Dysphagia, pharyngoesophageal phase (R13.14) Attention and concentration deficit following -- Frontal lobe and executive function deficit following -- Impact on safety and function Mild aspiration risk   CHL IP TREATMENT RECOMMENDATION 08/19/2016 Treatment Recommendations No treatment recommended at this time   Prognosis 08/15/2016 Prognosis for Safe Diet Advancement Good Barriers to Reach Goals -- Barriers/Prognosis Comment -- CHL IP DIET RECOMMENDATION 08/19/2016 SLP Diet Recommendations Regular solids;Thin liquid Liquid Administration via Cup;Straw Medication  Administration Whole meds with liquid Compensations Slow rate;Small sips/bites Postural Changes Remain semi-upright after after feeds/meals (Comment);Seated upright at 90 degrees   CHL IP OTHER RECOMMENDATIONS 08/19/2016 Recommended Consults Consider GI evaluation Oral Care Recommendations Oral care BID Other Recommendations --   CHL IP FOLLOW UP RECOMMENDATIONS 08/19/2016 Follow up Recommendations None   CHL IP FREQUENCY AND DURATION 08/15/2016 Speech Therapy Frequency (ACUTE ONLY) min 2x/week Treatment Duration 2 weeks      CHL IP ORAL PHASE 08/19/2016 Oral Phase Impaired Oral - Pudding Teaspoon -- Oral - Pudding Cup -- Oral - Honey Teaspoon -- Oral - Honey Cup -- Oral - Nectar Teaspoon -- Oral - Nectar Cup -- Oral - Nectar Straw -- Oral - Thin Teaspoon -- Oral - Thin Cup WFL Oral - Thin Straw Premature spillage Oral - Puree -- Oral - Mech Soft -- Oral - Regular WFL Oral - Multi-Consistency -- Oral - Pill WFL Oral Phase - Comment --  CHL IP PHARYNGEAL PHASE 08/19/2016 Pharyngeal Phase Impaired  Pharyngeal- Pudding Teaspoon -- Pharyngeal -- Pharyngeal- Pudding Cup -- Pharyngeal -- Pharyngeal- Honey Teaspoon -- Pharyngeal -- Pharyngeal- Honey Cup -- Pharyngeal -- Pharyngeal- Nectar Teaspoon -- Pharyngeal -- Pharyngeal- Nectar Cup -- Pharyngeal -- Pharyngeal- Nectar Straw -- Pharyngeal -- Pharyngeal- Thin Teaspoon -- Pharyngeal -- Pharyngeal- Thin Cup Pharyngeal residue - pyriform;Pharyngeal residue - valleculae;Reduced tongue base retraction;Reduced laryngeal elevation Pharyngeal -- Pharyngeal- Thin Straw Pharyngeal residue - pyriform;Reduced laryngeal elevation Pharyngeal -- Pharyngeal- Puree -- Pharyngeal -- Pharyngeal- Mechanical Soft -- Pharyngeal -- Pharyngeal- Regular Pharyngeal residue - valleculae;Reduced tongue base retraction Pharyngeal -- Pharyngeal- Multi-consistency -- Pharyngeal -- Pharyngeal- Pill WFL Pharyngeal -- Pharyngeal Comment --  CHL IP CERVICAL ESOPHAGEAL PHASE 08/19/2016 Cervical Esophageal Phase  Impaired Pudding Teaspoon -- Pudding Cup -- Honey Teaspoon -- Honey Cup -- Nectar Teaspoon -- Nectar Cup -- Nectar Straw -- Thin Teaspoon -- Thin Cup -- Thin Straw -- Puree -- Mechanical Soft -- Regular -- Multi-consistency -- Pill -- Cervical Esophageal Comment -- No flowsheet data found. [place] Siren 08/19/2016, 1:31 PM Orbie Pyo Colvin Caroli.Ed CCC-SLP Pager [phone]              US Thoracentesis Asp Pleural Space W/img Guide  Result Date: 08/25/2016 INDICATION: Patient with history of fall now with right pleural effusion. Request is made for diagnostic and therapeutic thoracentesis. EXAM: ULTRASOUND GUIDED DIAGNOSTIC AND THERAPEUTIC RIGHT THORACENTESIS MEDICATIONS: 10 mL 1% lidocaine COMPLICATIONS: None immediate. PROCEDURE: An ultrasound guided thoracentesis was thoroughly discussed with the patient and questions answered. The benefits, risks, alternatives and complications were also discussed. The patient understands and wishes to proceed with the procedure. Written consent was obtained. Ultrasound was performed to localize and mark an adequate pocket of fluid in the right chest. The area was then prepped and draped in the normal sterile fashion. 1% Lidocaine was used for local anesthesia. Under ultrasound guidance a Safe-T-centesis catheter was introduced. Thoracentesis was performed. The catheter was removed and a dressing applied. FINDINGS: A total of approximately 900 mL of bloody fluid was removed. Samples were sent to the laboratory as requested by the clinical team. IMPRESSION: Successful ultrasound guided diagnostic and therapeutic right thoracentesis yielding 900 mL of pleural fluid. Read by:  Brynda Greathouse PA-C Electronically Signed   By: Sandi Mariscal M.D.   On: 08/24/2016 17:25    Labs:  CBC:  Recent Labs  08/23/16 0334 08/24/16 0326 08/25/16 0309 08/26/16 0441  WBC 4.2 5.8 6.1 6.0  HGB 8.5* 9.4* 9.3* 9.8*  HCT 26.6* 29.3* 29.1* 30.3*  PLT 192 218 214 229     COAGS:  Recent Labs  08/13/16 1530 08/26/16 0805  INR 1.12 1.17  APTT  --  36    BMP:  Recent Labs  08/22/16 0322 08/23/16 0334 08/25/16 0309 08/26/16 0441  NA 138 139 138 137  K 4.2 4.3 3.9 3.9  CL 111 112* 104 100*  CO2 23 22 28 29   GLUCOSE 123* 93 96 95  BUN 19 13 15 16   CALCIUM 7.6* 7.7* 8.3* 8.3*  CREATININE 1.15 0.97 1.08 1.06  GFRNONAA 56* >60 >60 >60  GFRAA >60 >60 >60 >60    LIVER FUNCTION TESTS:  Recent Labs  08/13/16 1530 08/20/16 1026 08/21/16 0313  BILITOT 1.2 2.1* 1.4*  AST 88* 42* 39  ALT 42 39 34  ALKPHOS 47 54 50  PROT 6.7 5.9* 5.3*  ALBUMIN 3.9 3.0* 2.7*    TUMOR MARKERS: No results for input(s): AFPTM, CEA, CA199, CHROMGRNA in the last 8760 hours.  Assessment and Plan:  Painful Thoracic 10 and 11 fractures Pain meds no relief Scheduled for T 10 and 11 VP/KP Insurance has been pre approved Risks and Benefits discussed with the patient and daughter including, but not limited to education regarding the natural healing process of compression fractures without intervention, bleeding, infection, cement migration which may cause spinal cord damage, paralysis, pulmonary embolism or even death. All of the patient's and daughters questions were answered, they are agreeable to proceed. Consent signed and in chart.  Thank you for this interesting consult.  I greatly enjoyed meeting Elisha Cooksey and look forward to participating in their care.  A copy of this report was sent to the requesting provider on this date.  Electronically Signed: Eswin Worrell A 08/26/2016, 9:54 AM   I spent a total of 40 Minutes    in face to face in clinical consultation, greater than 50% of which was counseling/coordinating care for Thoracic 10/11 VP/KP

## 2016-08-26 NOTE — H&P (Signed)
Physical Medicine and Rehabilitation Admission H&P    Chief Complaint  Patient presents with  . Trauma  : HPI: [name] is a 83 y.o. right handed [place] speaking male with history of hypertension. Per chart review patient lives alone. His daughter lives in the home next to him. Patient reported to be independent prior to admission. Question of 24 hour assistance on discharge. Presented 08/13/2016 after a fall  from a tree while cutting branches. No report of loss of consciousness. Complaints of back pain. Cranial CT reviewed, unremarkable for acute process. CT cervical spine negative. CT of the chest abdomen and pelvis showed a right pneumothorax with right chest wall subcutaneous emphysema. Right costochondral fractures. Small anterior mediastinal hematoma. No evidence for great vessel injury or acute intra-abdominal injury. Conservative care of right pneumothorax.MRI thoracic spine for increasing back pain 08/24/2016 showed acute appearing T10 fracture without height loss. Mild T11 acute compression fracture and edema extending to the posterior element. No neurocompression. Neurosurgery Dr. Cyndy Freeze consulted advise conservative care with TLSO back brace applied. Patient underwent a right thoracentesis for right pleural effusion 08/24/2016 with a 900 mL yield of bloody fluid removed. Decrease in hemoglobin with follow-up CT abdomen and pelvis showed pneumoperitoneum of which patient was transfused close monitoring of CBC with latest hemoglobin 8.5. Patient developed nausea abdominal bloating as well as reports of chronic dysphagia with gastroenterology consulted 08/20/2016. A modified barium swallow was completed and advised regular diet. A plain abdominal film suggested mild large and small bowel ileus and no signs of peritonitis. A nasogastric tube was placed for decompression and Diet slowly advanced.   Hospital course pain management.Patient did have bouts of agitation and restlessness and was  provided with a sitter for safety.  Physical occupational therapy evaluations completed. Recommendations for physical medicine rehabilitation consult.Patient was admitted for a comprehensive rehabilitation program  Review of Systems  Unable to perform ROS: Language   Past Medical History:  Diagnosis Date  . At risk for sleep apnea    STOP-BANG= 4    SENT TO PCP 04-04-2014  . BPH (benign prostatic hypertrophy)   . Depression   . Diverticulosis of colon   . GERD (gastroesophageal reflux disease)   . Gilbert's syndrome   . Headache    migraines  . Hearing loss   . Heart murmur    "Calcified aortic valve"  . Hepatitis    Hep A as a child  . History of gastroenteritis   . History of kidney stones   . History of syncope    2002--  vasovagal  . HOH (hard of hearing)    refuses to wear his Hearing Aid  . Hypertension   . Inguinal hernia    right  . Internal hemorrhoid, bleeding   . Moderate aortic stenosis    a. Last echo 11/2013, 06/2014.  Marland Kitchen Nocturia   . Normal coronary arteries    a. Cath 07/2010: normal coronaries. Felt to have noncardiac CP/SOB at that time possibly r/t anxiety.  . RA (rheumatoid arthritis) ([place])    bilateral hands/ wrist--  seronegative  . Rotator cuff syndrome of right shoulder 08/01/2013  . Shortness of breath dyspnea    with exertion  . Stone, kidney   . Thyroid cyst   . Thyroid nodule    noted 11-2009  . Wears dentures    Past Surgical History:  Procedure Laterality Date  . CARDIAC CATHETERIZATION  07-19-2010  dr Tressia Miners turner   normal coronary arteries,  ef 60%,  moderate aortic stenosis- gradient 25mHg, normal right heart pressure  . CARDIOVASCULAR STRESS TEST  05/ 2011   dr sMarlou Porch  normal low risk perfusion study  . CATARACT EXTRACTION W/ INTRAOCULAR LENS  IMPLANT, BILATERAL  2013  . COLONOSCOPY  2010  approx  . THYROID LOBECTOMY Right 05/05/2015   Procedure: RIGHT THYROID LOBECTOMY;  Surgeon: TArmandina Gemma MD;  [facility]  Service:  General;  Laterality: Right;  . TRANSANAL HEMORRHOIDAL DEARTERIALIZATION N/A 04/09/2014   Procedure: TRANSANAL HEMORRHOIDAL DEARTERIALIZATION OF INTERNAL HEMORROIDS;  Surgeon: ALeighton Ruff MD;  [facility]  Service: General;  Laterality: N/A;  . TRANSTHORACIC ECHOCARDIOGRAM  11-26-2013   moderate focal basal and mild LVH/  ef 681-44%/ grade I diastolic dysfunction/ mild LAE/  moderate calcification with stenosis AV with mild regurg,  gradients 35 abd 58 mmHg /  mild TR   Family History  Problem Relation Age of Onset  . Pulmonary embolism Mother 441   caused by complications of surgery  . CVA Father   . Diabetes Brother   . Diabetes Brother   . Breast cancer Sister    Social History:  reports that he quit smoking about 35 years ago. His smoking use included Cigarettes. He started smoking about 43 years ago. He quit after 40.00 years of use. He has never used smokeless tobacco. He reports that he drinks alcohol. He reports that he does not use drugs. Allergies:  Allergies  Allergen Reactions  . Ativan [Lorazepam]     hallucinations   Medications Prior to Admission  Medication Sig Dispense Refill  . aspirin EC 81 MG tablet Take 81 mg by mouth daily.    .Marland KitchenDIOVAN 80 MG tablet Take 80 mg by mouth every morning.     . escitalopram (LEXAPRO) 20 MG tablet Take 20 mg by mouth daily.   4  . folic acid (FOLVITE) 1 MG tablet Take 1 mg by mouth daily.   0  . hydrochlorothiazide (MICROZIDE) 12.5 MG capsule Take 12.5 mg by mouth daily.    .Marland KitchenHYDROcodone-acetaminophen (NORCO/VICODIN) 5-325 MG tablet Take 1-2 tablets by mouth every 4 (four) hours as needed for moderate pain. 20 tablet 0  . ibuprofen (ADVIL,MOTRIN) 800 MG tablet Take 800 mg by mouth every 8 (eight) hours as needed for moderate pain.     . methotrexate (RHEUMATREX) 2.5 MG tablet Take 22.5 mg by mouth once a week. TAKE ON SUNDAYS    . omeprazole (PRILOSEC) 20 MG capsule TAKE 1 CAPSULE(20 MG) BY MOUTH DAILY 15  capsule 0  . simvastatin (ZOCOR) 20 MG tablet TAKE 1 TABLET BY MOUTH EVERY NIGHT AT BEDTIME 30 tablet 0  . dutasteride (AVODART) 0.5 MG capsule Take 0.5 mg by mouth daily.  0    Home: Home Living Family/patient expects to be discharged to:: Private residence Living Arrangements: Children ([place] Available Help at Discharge: Family, Available 24 hours/day (until 3/17 and then intermittently) Type of Home: House Home Access: Stairs to enter ECenterPoint Energyof Steps: 2 Home Layout: One level Bathroom Shower/Tub: TChiropodist Handicapped height Home Equipment: Grab bars - tub/shower   Functional History: Prior Function Level of Independence: Independent Comments: drives  Functional Status:  Mobility: Bed Mobility Overal bed mobility: Needs Assistance Bed Mobility: Supine to Sit, Rolling Rolling: Min assist Supine to sit: Min assist Sit to supine: Supervision, HOB elevated (with bed rail) General bed mobility comments: bed mobility and supine to  sit performed x 2; pt needing mod cues for proper sequencing and technique Transfers Overall transfer level: Needs assistance Equipment used: Rolling walker (2 wheeled) Transfers: Sit to/from Stand Sit to Stand: Min guard Stand pivot transfers: Min guard General transfer comment: deferred due to SOB Ambulation/Gait Ambulation/Gait assistance: Min guard Ambulation Distance (Feet): 170 Feet Assistive device: Rolling walker (2 wheeled) Gait Pattern/deviations: Step-through pattern, Decreased stride length, Drifts right/left General Gait Details: deferred due to SOB Gait velocity: decr Gait velocity interpretation: Below normal speed for age/gender    ADL: ADL Overall ADL's : Needs assistance/impaired Eating/Feeding: Independent, Sitting Grooming: Wash/dry hands, Wash/dry face, Oral care, Supervision/safety, Standing Grooming Details (indicate cue type and reason): Pt  stood at sink for appx 6 minutes with S. Upper Body Bathing: Set up, Sitting Lower Body Bathing: Moderate assistance, Sit to/from stand Lower Body Bathing Details (indicate cue type and reason): followed up on use of long sponge Upper Body Dressing : Minimal assistance, Sitting Upper Body Dressing Details (indicate cue type and reason): front opening gown Lower Body Dressing: Sit to/from stand, Minimal assistance Lower Body Dressing Details (indicate cue type and reason): practiced use of sock aide and demonstrated use of reacher for pants, removing sock Toilet Transfer: Min guard, RW, Cueing for safety Toilet Transfer Details (indicate cue type and reason): pt walked to bathroom. cues to reach back when sitting and to keep walker close by. Toileting- Water quality scientist and Hygiene: Min guard, Sit to/from stand, Cueing for safety Toileting - Clothing Manipulation Details (indicate cue type and reason): cues to not get up alone. Do not leave in bathroom alone; pt will get up. Functional mobility during ADLs: Minimal assistance, Rolling walker, Cueing for sequencing, Cueing for safety General ADL Comments: Educated family in safe techniques and need for someone to be with pt at all times at this point.  Cognition: Cognition Overall Cognitive Status: Within Functional Limits for tasks assessed Orientation Level: Oriented X4 Cognition Arousal/Alertness: Awake/alert Behavior During Therapy: Restless Overall Cognitive Status: Within Functional Limits for tasks assessed General Comments: pt restless due to increased SOB, needed breathing tx during session due to wheezing and SOB  Physical Exam: Blood pressure 136/61, pulse 62, temperature 98.1 F (36.7 C), temperature source Oral, resp. rate 16, height _0  (1.753 m), weight 82 kg (180 lb 11.2 oz), SpO2 95 %. Physical Exam Vitals reviewed Constitutional. He appears well-developed. Mood and behavior appear normal Obese HEENT. Head  normocephalic Eyes pupils round and reactive to light no discharge Neck. Supple nontender no JVD Cardiac RRR Chest- occasional rhonchi and exp wheezes GI. Abdomen mildly distended nontender he does have bowel sounds Musculoskeletal. He exhibits no edema. Low and mid back tender to palpation Skin. Warm and dry.  Neurological: He is alert.   He is able to follow basic commands with tactile cues.  Motor: B/L UE, RLE 5/5 proximal to distal LLE: HF and KE 4/5, distally 5/5 Sensation appears to be intact to light touch in all 4 limbs No obvious cognitive issues. Communication limited somewhat by language barrier Psych: pleasant and cooperative   Results for orders placed or performed during the hospital encounter of 08/13/16 (from the past 48 hour(s))  Culture, body fluid-bottle     Status: None (Preliminary result)   Collection Time: 08/24/16  5:29 PM  Result Value Ref Range   Specimen Description FLUID RIGHT PLEURAL    Special Requests BOTTLES DRAWN AEROBIC AND ANAEROBIC 10CC    Culture NO GROWTH < 24 HOURS    Report  Status PENDING   Gram stain     Status: None   Collection Time: 08/24/16  5:29 PM  Result Value Ref Range   Specimen Description FLUID RIGHT PLEURAL    Special Requests NONE    Gram Stain      ABUNDANT WBC PRESENT,BOTH PMN AND MONONUCLEAR NO ORGANISMS SEEN    Report Status 08/24/2016 FINAL   CBC     Status: Abnormal   Collection Time: 08/25/16  3:09 AM  Result Value Ref Range   WBC 6.1 4.0 - 10.5 K/uL   RBC 3.25 (L) 4.22 - 5.81 MIL/uL   Hemoglobin 9.3 (L) 13.0 - 17.0 g/dL   HCT 29.1 (L) 39.0 - 52.0 %   MCV 89.5 78.0 - 100.0 fL   MCH 28.6 26.0 - 34.0 pg   MCHC 32.0 30.0 - 36.0 g/dL   RDW 14.6 11.5 - 15.5 %   Platelets 214 150 - 400 K/uL  Basic metabolic panel     Status: Abnormal   Collection Time: 08/25/16  3:09 AM  Result Value Ref Range   Sodium 138 135 - 145 mmol/L   Potassium 3.9 3.5 - 5.1 mmol/L   Chloride 104 101 - 111 mmol/L   CO2 28 22 - 32 mmol/L    Glucose, Bld 96 65 - 99 mg/dL   BUN 15 6 - 20 mg/dL   Creatinine, Ser 1.08 0.61 - 1.24 mg/dL   Calcium 8.3 (L) 8.9 - 10.3 mg/dL   GFR calc non Af Amer >60 >60 mL/min   GFR calc Af Amer >60 >60 mL/min    Comment: (NOTE) The eGFR has been calculated using the CKD EPI equation. This calculation has not been validated in all clinical situations. eGFR's persistently <60 mL/min signify possible Chronic Kidney Disease.    Anion gap 6 5 - 15  CBC     Status: Abnormal   Collection Time: 08/26/16  4:41 AM  Result Value Ref Range   WBC 6.0 4.0 - 10.5 K/uL   RBC 3.39 (L) 4.22 - 5.81 MIL/uL   Hemoglobin 9.8 (L) 13.0 - 17.0 g/dL   HCT 30.3 (L) 39.0 - 52.0 %   MCV 89.4 78.0 - 100.0 fL   MCH 28.9 26.0 - 34.0 pg   MCHC 32.3 30.0 - 36.0 g/dL   RDW 14.3 11.5 - 15.5 %   Platelets 229 150 - 400 K/uL  Basic metabolic panel     Status: Abnormal   Collection Time: 08/26/16  4:41 AM  Result Value Ref Range   Sodium 137 135 - 145 mmol/L   Potassium 3.9 3.5 - 5.1 mmol/L   Chloride 100 (L) 101 - 111 mmol/L   CO2 29 22 - 32 mmol/L   Glucose, Bld 95 65 - 99 mg/dL   BUN 16 6 - 20 mg/dL   Creatinine, Ser 1.06 0.61 - 1.24 mg/dL   Calcium 8.3 (L) 8.9 - 10.3 mg/dL   GFR calc non Af Amer >60 >60 mL/min   GFR calc Af Amer >60 >60 mL/min    Comment: (NOTE) The eGFR has been calculated using the CKD EPI equation. This calculation has not been validated in all clinical situations. eGFR's persistently <60 mL/min signify possible Chronic Kidney Disease.    Anion gap 8 5 - 15  Protime-INR     Status: None   Collection Time: 08/26/16  8:05 AM  Result Value Ref Range   Prothrombin Time 14.9 11.4 - 15.2 seconds   INR 1.17   APTT  Status: None   Collection Time: 08/26/16  8:05 AM  Result Value Ref Range   aPTT 36 24 - 36 seconds   Ct Chest Wo Contrast  Result Date: 08/24/2016 CLINICAL DATA:  [place] from tree several days ago. Pneumomediastinum and pneumothorax previously noted. Subcutaneous gas  previously noted. Short of breath following thoracentesis EXAM: CT CHEST WITHOUT CONTRAST TECHNIQUE: Multidetector CT imaging of the chest was performed following the standard protocol without IV contrast. COMPARISON:  CT chest 08/13/2016 chest radiograph 08/23/2016 FINDINGS: Cardiovascular: Again demonstrated small amount of gas in the anterior mediastinum and mediastinal fat anterior to the heart. No pericardial fluid. Airways appear normal. Esophagus normal. Mediastinum/Nodes: No axillary supraclavicular adenopathy. There is subcutaneous gas along the RIGHT chest wall extending to the RIGHT neck and to lesser degree the LEFT neck and left anterior chest. Lungs/Pleura: No pneumothorax present. There is bibasilar atelectasis pleural effusions. Upper Abdomen: Limited view of the upper abdomen demonstrates no peritoneal fluid. Adrenal glands are normal. Musculoskeletal: Fracture the posterior eighth rib, seventh rib. There are fractures noted in the bilateral pedicles at T10 (image 64, series 8 and image 57, series 8). Fractures are mildly distracted. Fracture extends the posterior wall of the T2 vertebral body. No loss of vertebral body height. Probable fracture the superior endplate of E33. No loss vertebral body height. No paraspinal or epidural hematoma evident. IMPRESSION: 1. No pneumothorax. 2. Bilateral small pleural effusions and medial basilar atelectasis. 3. Subcutaneous emphysema and pneumomediastinum not changed. 4. Posterior RIGHT seventh and eighth rib fractures. 5. BILATERAL SUBACUTE PEDICLE FRACTURES AT T10. These are UNSTABLE fractures. Recommend nonemergent neurosurgical consultation. 6. Probable superior endplate fracture I95. Findings conveyed toFloor nurse, Chama on 08/24/2016  at18:36. Electronically Signed   By: Suzy Bouchard M.D.   On: 08/24/2016 18:44   Mr Thoracic Spine Wo Contrast  Result Date: 08/24/2016 CLINICAL DATA:  Follow-up T10 unstable fracture incidentally noted during  CT-guided thoracentesis. EXAM: MRI THORACIC SPINE WITHOUT CONTRAST TECHNIQUE: Multiplanar, multisequence MR imaging of the thoracic spine was performed. No intravenous contrast was administered. COMPARISON:  CT chest August 24, 2016 at 1731 hours FINDINGS: ALIGNMENT: Maintenance of the thoracic kyphosis. No malalignment. VERTEBRAE/DISCS: Low T1 and bright STIR signal within the T10 and T11 vertebral bodies extending into the posterior elements. Linear low T1 through T10 posterior elements corresponding to known fracture. No retropulsed bony fragments. Mild (less than 20%) T11 vertebral body height loss with bright STIR signal within the T10-11 disc, to lesser extent at T9-10. Multilevel mild chronic discogenic endplate changes. Intervertebral disc heights generally preserved with slight desiccation and mineralization is evidenced on today's CT. CORD: Thoracic spinal cord is normal morphology and signal characteristics to the level of the conus medullaris which terminates at T12-L1. PREVERTEBRAL AND PARASPINAL SOFT TISSUES: T2 bright signal within the anterior longitudinal ligament T10-11, within the interspinous and supraspinous ligaments at T10-11. Mild bright STIR paraspinal muscle strain T10-11 and more caudal levels. Dependent pleural effusion and consolidation partially imaged. T2 bright cysts in the kidneys. DISC LEVELS: No disc bulge, canal stenosis or neural foraminal narrowing any level. IMPRESSION: Acute appearing T10-3 column fracture without height loss. Mild T11 acute compression fracture and edema extending to the posterior elements. Focally disrupted T10-11 ligaments. Low-grade lower thoracic paraspinal muscle strain. No neurocompression. Electronically Signed   By: Elon Alas M.D.   On: 08/24/2016 23:50   Dg Chest Port 1 View  Result Date: 08/25/2016 CLINICAL DATA:  83 year old male status post fall with chest trauma including T10 Chance  type fracture, right rib fractures, pleural  effusions, pneumomediastinum and subcutaneous gas. EXAM: PORTABLE CHEST 1 VIEW COMPARISON:  Chest CT 08/24/2016 and earlier. FINDINGS: Portable AP semi upright view at 0645 hours. Stable mild right chest wall subcutaneous gas. Improved lung volumes since 08/23/2016. Mediastinal contours remain normal. No pneumothorax. No pulmonary edema. No pleural effusion is evident. No confluent pulmonary opacity. Stable right thoracic inlet surgical clips. Stable visualized osseous structures. IMPRESSION: 1. Improved lung volumes and ventilation since 08/23/2016. Pleural effusions are no longer evident, no confluent pulmonary opacity. 2. T10 Chance type fracture and right rib fractures better demonstrated on yesterday CT and MRI. Electronically Signed   By: Genevie Ann M.D.   On: 08/25/2016 07:34   US Thoracentesis Asp Pleural Space W/img Guide  Result Date: 08/25/2016 INDICATION: Patient with history of fall now with right pleural effusion. Request is made for diagnostic and therapeutic thoracentesis. EXAM: ULTRASOUND GUIDED DIAGNOSTIC AND THERAPEUTIC RIGHT THORACENTESIS MEDICATIONS: 10 mL 1% lidocaine COMPLICATIONS: None immediate. PROCEDURE: An ultrasound guided thoracentesis was thoroughly discussed with the patient and questions answered. The benefits, risks, alternatives and complications were also discussed. The patient understands and wishes to proceed with the procedure. Written consent was obtained. Ultrasound was performed to localize and mark an adequate pocket of fluid in the right chest. The area was then prepped and draped in the normal sterile fashion. 1% Lidocaine was used for local anesthesia. Under ultrasound guidance a Safe-T-centesis catheter was introduced. Thoracentesis was performed. The catheter was removed and a dressing applied. FINDINGS: A total of approximately 900 mL of bloody fluid was removed. Samples were sent to the laboratory as requested by the clinical team. IMPRESSION: Successful ultrasound  guided diagnostic and therapeutic right thoracentesis yielding 900 mL of pleural fluid. Read by:  Brynda Greathouse PA-C Electronically Signed   By: Sandi Mariscal M.D.   On: 08/24/2016 17:25       Medical Problem List and Plan: 1.  Polytrauma, right pneumothorax with right chest wall subcutaneous emphysema, right costochondral fractures, small anterior mediastinal hematoma, pneumoperitoneum as well as by ventricular fractures of T10, T11 -TLSOsecondary to fall from a tree cutting branches  -pt admitted for inpatient rehab 2.  DVT Prophylaxis/Anticoagulation: Subcutaneous Lovenox 40 mg daily. Check vascular study on admit 3. Pain Management: Flexeril 5 mg 3 times a day, Ultram 50 mg every 6 hours  -fair control at present  -TLSO when OOB 4. Mood: Lexapro 20 mg daily 5. Neuropsych: This patient is capable of making decisions on his own behalf. 6. Skin/Wound Care: Routine skin checks 7. Fluids/Electrolytes/Nutrition: Routine I&O with follow-up chemistries personallly reviewed today  -encourage PO 8. Blood loss anemia. Follow-up CBC shows improvement today (11.2)  -I personally reviewed the patient's labs today.  9. Ileus. Advance diet as tolerated 10. Hypertension. Avapro 75 mg daily, HCTZ 12.5 mg daily 11. Hyperlipidemia. Zocor 12. Constipation. Laxative assistance   Post Admission Physician Evaluation: 1. Functional deficits secondary  to polytrauma as above. 2. Patient is admitted to receive collaborative, interdisciplinary care between the physiatrist, rehab nursing staff, and therapy team. 3. Patient's level of medical complexity and substantial therapy needs in context of that medical necessity cannot be provided at a lesser intensity of care such as a SNF. 4. Patient has experienced substantial functional loss from his/her baseline which was documented above under the "Functional History" and "Functional Status" headings.  Judging by the patient's diagnosis, physical exam, and functional  history, the patient has potential for functional progress which will result in  measurable gains while on inpatient rehab.  These gains will be of substantial and practical use upon discharge  in facilitating mobility and self-care at the household level. 5. Physiatrist will provide 24 hour management of medical needs as well as oversight of the therapy plan/treatment and provide guidance as appropriate regarding the interaction of the two. 6. The Preadmission Screening has been reviewed and patient status is unchanged unless otherwise stated above. 7. 24 hour rehab nursing will assist with bladder management, bowel management, safety, skin/wound care, disease management, medication administration, pain management and patient education  and help integrate therapy concepts, techniques,education, etc. 8. PT will assess and treat for/with: Lower extremity strength, range of motion, stamina, balance, functional mobility, safety, adaptive techniques and equipment, NMR, pain mgt, surgical/back precautions, brace wear, family ed.   Goals are: mod I to supervision. 9. OT will assess and treat for/with: ADL's, functional mobility, safety, upper extremity strength, adaptive techniques and equipment, NMR, brace don/doff, family education, back precautions.   Goals are: mod I to supervision. Therapy may proceed with showering this patient. 10. SLP will assess and treat for/with: n/a.  Goals are: n/a. 11. Case Management and Social Worker will assess and treat for psychological issues and discharge planning. 12. Team conference will be held weekly to assess progress toward goals and to determine barriers to discharge. 13. Patient will receive at least 3 hours of therapy per day at least 5 days per week. 14. ELOS: 12-16 days       15. Prognosis:  excellent     Meredith Staggers, MD, [facility] 08/29/2016  Cathlyn Parsons., PA-C 08/26/2016

## 2016-08-26 NOTE — Progress Notes (Signed)
PT Cancellation Note  Patient Details Name: Donevan Biller MRN: [id number] DOB: 12-14-1927   Cancelled Treatment:    Reason Eval/Treat Not Completed: Medical issues which prohibited therapy. Pt remains on bedrest, will continue to follow and evaluate when appropriate.    Cassell Clement, PT, CSCS Pager [phone] Office [phone]  08/26/2016, 9:46 AM

## 2016-08-26 NOTE — Progress Notes (Signed)
Rehab admissions - I met with patient at the bedside and then called his daughter, [name].  Daughter assures me that patient will return to her home here in [place] and will have 24/7 coverage as needed after rehab.  [name] is in town next week from [place].  She plans for her sister, herself and grand daughter to provide care.  If needed she will also hire caregiver assistance.  Daughter does not want patient to go to a SNF.  I am awaiting clearance from trauma/neuro surgical team and then can potentially admit to inpatient rehab tomorrow if patient is cleared.  Call me for questions.  #[phone]

## 2016-08-26 NOTE — Progress Notes (Signed)
Trauma Service Note  Subjective: Patient seems to be comfortable in the bed. No distress.  From what I can tell he has no back pain or tenderness.  Neurosurgeon is adamant that he does not need a kyphoplasty.  We would not treat a radiological finding if clinically he does not have any symptoms.  Objective: Vital signs in last 24 hours: Temp:  [98 F (36.7 C)-98.9 F (37.2 C)] 98.1 F (36.7 C) (03/16 0728) Pulse Rate:  [59-77] 62 (03/16 0728) Resp:  [9-20] 16 (03/16 0728) BP: (119-151)/(43-76) 136/61 (03/16 0728) SpO2:  [94 %-100 %] 95 % (03/16 0939) Weight:  [82 kg (180 lb 11.2 oz)] 82 kg (180 lb 11.2 oz) (03/16 0439) Last BM Date: 08/24/16  Intake/Output from previous day: 03/15 0701 - 03/16 0700 In: 720 [P.O.:720] Out: 950 [Urine:950] Intake/Output this shift: Total I/O In: -  Out: 225 [Urine:225]  General: No acute distress.  Lungs: Clear.  Mild wheezing today.  Abd: Soft, good bowel.  Has had some bowel movements  Extremities: No changes  Neuro: Intact  Lab Results: CBC   Recent Labs  08/25/16 0309 08/26/16 0441  WBC 6.1 6.0  HGB 9.3* 9.8*  HCT 29.1* 30.3*  PLT 214 229   BMET  Recent Labs  08/25/16 0309 08/26/16 0441  NA 138 137  K 3.9 3.9  CL 104 100*  CO2 28 29  GLUCOSE 96 95  BUN 15 16  CREATININE 1.08 1.06  CALCIUM 8.3* 8.3*   PT/INR  Recent Labs  08/26/16 0805  LABPROT 14.9  INR 1.17   ABG No results for input(s): PHART, HCO3 in the last 72 hours.  Invalid input(s): PCO2, PO2  Studies/Results: Ct Chest Wo Contrast  Result Date: 08/24/2016 CLINICAL DATA:  [place] from tree several days ago. Pneumomediastinum and pneumothorax previously noted. Subcutaneous gas previously noted. Short of breath following thoracentesis EXAM: CT CHEST WITHOUT CONTRAST TECHNIQUE: Multidetector CT imaging of the chest was performed following the standard protocol without IV contrast. COMPARISON:  CT chest 08/13/2016 chest radiograph 08/23/2016 FINDINGS:  Cardiovascular: Again demonstrated small amount of gas in the anterior mediastinum and mediastinal fat anterior to the heart. No pericardial fluid. Airways appear normal. Esophagus normal. Mediastinum/Nodes: No axillary supraclavicular adenopathy. There is subcutaneous gas along the RIGHT chest wall extending to the RIGHT neck and to lesser degree the LEFT neck and left anterior chest. Lungs/Pleura: No pneumothorax present. There is bibasilar atelectasis pleural effusions. Upper Abdomen: Limited view of the upper abdomen demonstrates no peritoneal fluid. Adrenal glands are normal. Musculoskeletal: Fracture the posterior eighth rib, seventh rib. There are fractures noted in the bilateral pedicles at T10 (image 64, series 8 and image 57, series 8). Fractures are mildly distracted. Fracture extends the posterior wall of the T2 vertebral body. No loss of vertebral body height. Probable fracture the superior endplate of F09. No loss vertebral body height. No paraspinal or epidural hematoma evident. IMPRESSION: 1. No pneumothorax. 2. Bilateral small pleural effusions and medial basilar atelectasis. 3. Subcutaneous emphysema and pneumomediastinum not changed. 4. Posterior RIGHT seventh and eighth rib fractures. 5. BILATERAL SUBACUTE PEDICLE FRACTURES AT T10. These are UNSTABLE fractures. Recommend nonemergent neurosurgical consultation. 6. Probable superior endplate fracture N23. Findings conveyed toFloor nurse, Chama on 08/24/2016  at18:36. Electronically Signed   By: Suzy Bouchard M.D.   On: 08/24/2016 18:44   Mr Thoracic Spine Wo Contrast  Result Date: 08/24/2016 CLINICAL DATA:  Follow-up T10 unstable fracture incidentally noted during CT-guided thoracentesis. EXAM: MRI THORACIC SPINE WITHOUT CONTRAST  TECHNIQUE: Multiplanar, multisequence MR imaging of the thoracic spine was performed. No intravenous contrast was administered. COMPARISON:  CT chest August 24, 2016 at 1731 hours FINDINGS: ALIGNMENT: Maintenance of  the thoracic kyphosis. No malalignment. VERTEBRAE/DISCS: Low T1 and bright STIR signal within the T10 and T11 vertebral bodies extending into the posterior elements. Linear low T1 through T10 posterior elements corresponding to known fracture. No retropulsed bony fragments. Mild (less than 20%) T11 vertebral body height loss with bright STIR signal within the T10-11 disc, to lesser extent at T9-10. Multilevel mild chronic discogenic endplate changes. Intervertebral disc heights generally preserved with slight desiccation and mineralization is evidenced on today's CT. CORD: Thoracic spinal cord is normal morphology and signal characteristics to the level of the conus medullaris which terminates at T12-L1. PREVERTEBRAL AND PARASPINAL SOFT TISSUES: T2 bright signal within the anterior longitudinal ligament T10-11, within the interspinous and supraspinous ligaments at T10-11. Mild bright STIR paraspinal muscle strain T10-11 and more caudal levels. Dependent pleural effusion and consolidation partially imaged. T2 bright cysts in the kidneys. DISC LEVELS: No disc bulge, canal stenosis or neural foraminal narrowing any level. IMPRESSION: Acute appearing T10-3 column fracture without height loss. Mild T11 acute compression fracture and edema extending to the posterior elements. Focally disrupted T10-11 ligaments. Low-grade lower thoracic paraspinal muscle strain. No neurocompression. Electronically Signed   By: Elon Alas M.D.   On: 08/24/2016 23:50   Dg Chest Port 1 View  Result Date: 08/25/2016 CLINICAL DATA:  83 year old male status post fall with chest trauma including T10 Chance type fracture, right rib fractures, pleural effusions, pneumomediastinum and subcutaneous gas. EXAM: PORTABLE CHEST 1 VIEW COMPARISON:  Chest CT 08/24/2016 and earlier. FINDINGS: Portable AP semi upright view at 0645 hours. Stable mild right chest wall subcutaneous gas. Improved lung volumes since 08/23/2016. Mediastinal contours  remain normal. No pneumothorax. No pulmonary edema. No pleural effusion is evident. No confluent pulmonary opacity. Stable right thoracic inlet surgical clips. Stable visualized osseous structures. IMPRESSION: 1. Improved lung volumes and ventilation since 08/23/2016. Pleural effusions are no longer evident, no confluent pulmonary opacity. 2. T10 Chance type fracture and right rib fractures better demonstrated on yesterday CT and MRI. Electronically Signed   By: Genevie Ann M.D.   On: 08/25/2016 07:34   US Thoracentesis Asp Pleural Space W/img Guide  Result Date: 08/25/2016 INDICATION: Patient with history of fall now with right pleural effusion. Request is made for diagnostic and therapeutic thoracentesis. EXAM: ULTRASOUND GUIDED DIAGNOSTIC AND THERAPEUTIC RIGHT THORACENTESIS MEDICATIONS: 10 mL 1% lidocaine COMPLICATIONS: None immediate. PROCEDURE: An ultrasound guided thoracentesis was thoroughly discussed with the patient and questions answered. The benefits, risks, alternatives and complications were also discussed. The patient understands and wishes to proceed with the procedure. Written consent was obtained. Ultrasound was performed to localize and mark an adequate pocket of fluid in the right chest. The area was then prepped and draped in the normal sterile fashion. 1% Lidocaine was used for local anesthesia. Under ultrasound guidance a Safe-T-centesis catheter was introduced. Thoracentesis was performed. The catheter was removed and a dressing applied. FINDINGS: A total of approximately 900 mL of bloody fluid was removed. Samples were sent to the laboratory as requested by the clinical team. IMPRESSION: Successful ultrasound guided diagnostic and therapeutic right thoracentesis yielding 900 mL of pleural fluid. Read by:  Brynda Greathouse PA-C Electronically Signed   By: Sandi Mariscal M.D.   On: 08/24/2016 17:25    Anti-infectives: Anti-infectives    None      Assessment/Plan:  s/p  There are currently  too many physicians trying o make decisions without communicating.  I directly spoke with the neurosurgeon when it was discovered that he had the pedicle fractures.  Subsequently other consultants have been brought into his care without the primary service knowing about this, and that is inappropriate in a non-critical situation.   He has no neurological deficits.  He has no back pain or tenderness that we know,  Although anatomically potentially unstable, he is medically not in the condition to undergo surgery at this time.  Hoping to heal at rest or with mobilization in TLSO brace.  Will get X-rays in the sitting and resting positions.  Will get Rehab consultation.  LOS: 13 days   Kathryne Eriksson. Dahlia Bailiff, MD, FACS [phone] Trauma Surgeon 08/26/2016

## 2016-08-26 NOTE — Progress Notes (Addendum)
PROGRESS NOTE   [name]  YIF:[id number] DOB: 02-Mar-1928 DOA: 08/13/2016   PCP: [name], MD   Chief Complaint  Patient presents with  . Trauma    Brief Narrative:  [facility] consulted for medical management.   Pt is 83 yo male who was admitted on 08/13/2016 after an episode of fall from tree.     Assessment & Plan   Deconditioning/Fall - with new appearing T10-3 column fracture without height loss. Mild T11 acute compression fracture and edema extending to the posterior elements. Focally disrupted T10-11 ligaments. Low-grade lower thoracic paraspinal muscle strain. - I have discussed with IR, pt is candidate for vertebroplasty, will discuss with Dr. Christella Noa to see if he agrees - I have discussed this with family   Anemia - ?Blood loss, internal bleeding - CT abd/pelvis listed below - FOBT negative - Anemia panel requested with next blood work  - Hemoglobin on admission 13, dropped to 7 - Transfused 2u PRBCs 3/11 - Hemoglobin trend: 9.9 --> 8.5 --> 9.4 --> 9.3 --> 9.8  Acute kidney injury - likely secondary to anemia and poor oral intake - Creatinine peaked to 1.87, now WNL - BMP In AM  Esophageal dysphagia and ileus - CT scan showed normal-appearing stomach, small bowel as well as colon and pneumoperitoneum - Occult blood negative - GI signed off on 3/12 as no further recommendations   Right pneumothorax - 5% of lung volume, present on admission - Continue nebs and incentive spirometry, pulm toilet  - Noted on CT abd/pelvis, pneumomediastinum - right thoracentesis done 3/14 and 900 cc bloody fluid removed and sent for analysis   Elevated troponin/Transient diaphoresis today, w/ diffuse pains - felt multifactorial, including pain, ileus, anemia, demand ischemia - Troponin mildly elevated, but flat - Echocardiogram showed EF 86-76%, grade 1 diastolic dysfunction  Dyspnea/?Acute diastolic heart failure - Patient was receiving IVF, discontinued - Given  dose of IV lasix - CT chest post thoracentesis identified only small bilateral pleural effusions  - respiratory status is stable this am and pt denies dyspnea   Hypertension, essential  - SBP in 140's this AM - continue HCTZ and irbesartan   Hyperlipidemina - continue asa and statin  GERD - Continue PPI  Depression - Continue On lexapro  DVT Prophylaxis  Discontinued lovenox given anemia, SCDs  Code Status: Full  Family Communication: no family at bedside this AM, I spoke with daughter over the phone   Disposition Plan: Consulted inpatient rehab, will sign off for now as anemia is stable and Cr is now WNL. Please call if you needs [place] to re consult. Thank you.  Mart Piggs [phone]  Consultants  [facility]  Cardiology  Gastroenterology - signed off 3/12  CIR - signed off as SNF was recommended   Antibiotics   Anti-infectives    None     Subjective:   Feels better this am but with persistent back pain.   Objective:   Vitals:   08/26/16 0439 08/26/16 0500 08/26/16 0606 08/26/16 0728  BP: (!) 119/43   136/61  Pulse: 70 (!) 59 66 62  Resp: (!) 9 17 16 16   Temp: 98 F (36.7 C)   98.1 F (36.7 C)  TempSrc: Oral   Oral  SpO2: 95% 95% 100% 98%  Weight: 82 kg (180 lb 11.2 oz)     Height:        Intake/Output Summary (Last 24 hours) at 08/26/16 0929 Last data filed at 08/26/16 0742  Gross per 24 hour  Intake              480 ml  Output             1175 ml  Net             -695 ml   Filed Weights   08/23/16 0424 08/25/16 0500 08/26/16 0439  Weight: 87.6 kg (193 lb 2 oz) 83.9 kg (184 lb 14.4 oz) 82 kg (180 lb 11.2 oz)    Exam  General: Well developed, well nourished,   HEENT: NCAT, mucous membranes moist.   Cardiovascular: S1 S2 auscultated,RRR  MSK chest wall TTP   Respiratory: better air movement bilaterally   Abdomen: Soft, nontender, distended, + bowel sounds  Extremities: warm dry without cyanosis clubbing or edema  Data Reviewed: I  have personally reviewed following labs and imaging studies  CBC:  Recent Labs Lab 08/22/16 1332 08/23/16 0334 08/24/16 0326 08/25/16 0309 08/26/16 0441  WBC 4.4 4.2 5.8 6.1 6.0  HGB 9.9* 8.5* 9.4* 9.3* 9.8*  HCT 31.3* 26.6* 29.3* 29.1* 30.3*  MCV 89.4 89.6 89.6 89.5 89.4  PLT 191 192 218 214 235   Basic Metabolic Panel:  Recent Labs Lab 08/21/16 0313 08/21/16 0844 08/22/16 0322 08/23/16 0334 08/25/16 0309 08/26/16 0441  NA 137  --  138 139 138 137  K 4.8  --  4.2 4.3 3.9 3.9  CL 106  --  111 112* 104 100*  CO2 25  --  23 22 28 29   GLUCOSE 162*  --  123* 93 96 95  BUN 27*  --  19 13 15 16   CREATININE 1.87*  --  1.15 0.97 1.08 1.06  CALCIUM 8.1*  --  7.6* 7.7* 8.3* 8.3*  MG  --  2.1  --   --   --   --    Liver Function Tests:  Recent Labs Lab 08/20/16 1026 08/21/16 0313  AST 42* 39  ALT 39 34  ALKPHOS 54 50  BILITOT 2.1* 1.4*  PROT 5.9* 5.3*  ALBUMIN 3.0* 2.7*    Recent Labs Lab 08/20/16 1026  LIPASE 14   Cardiac Enzymes:  Recent Labs Lab 08/20/16 1026 08/20/16 1458 08/20/16 1905 08/21/16 0313 08/21/16 0511  TROPONINI 0.05* 0.13* 0.14* 0.10* 0.10*   Urine analysis:    Component Value Date/Time   COLORURINE YELLOW 08/13/2016 1820   APPEARANCEUR CLEAR 08/13/2016 1820   LABSPEC 1.038 (H) 08/13/2016 1820   PHURINE 5.0 08/13/2016 1820   GLUCOSEU NEGATIVE 08/13/2016 1820   HGBUR LARGE (A) 08/13/2016 1820   BILIRUBINUR NEGATIVE 08/13/2016 1820   KETONESUR 20 (A) 08/13/2016 1820   PROTEINUR NEGATIVE 08/13/2016 1820   UROBILINOGEN 1.0 04/14/2014 0116   NITRITE NEGATIVE 08/13/2016 1820   LEUKOCYTESUR NEGATIVE 08/13/2016 1820   Radiology Studies: Ct Chest Wo Contrast  Result Date: 08/24/2016 CLINICAL DATA:  [place] from tree several days ago. Pneumomediastinum and pneumothorax previously noted. Subcutaneous gas previously noted. Short of breath following thoracentesis EXAM: CT CHEST WITHOUT CONTRAST TECHNIQUE: Multidetector CT imaging of the  chest was performed following the standard protocol without IV contrast. COMPARISON:  CT chest 08/13/2016 chest radiograph 08/23/2016 FINDINGS: Cardiovascular: Again demonstrated small amount of gas in the anterior mediastinum and mediastinal fat anterior to the heart. No pericardial fluid. Airways appear normal. Esophagus normal. Mediastinum/Nodes: No axillary supraclavicular adenopathy. There is subcutaneous gas along the RIGHT chest wall extending to the RIGHT neck and to lesser degree the LEFT neck and left anterior chest. Lungs/Pleura: No pneumothorax present.  There is bibasilar atelectasis pleural effusions. Upper Abdomen: Limited view of the upper abdomen demonstrates no peritoneal fluid. Adrenal glands are normal. Musculoskeletal: Fracture the posterior eighth rib, seventh rib. There are fractures noted in the bilateral pedicles at T10 (image 64, series 8 and image 57, series 8). Fractures are mildly distracted. Fracture extends the posterior wall of the T2 vertebral body. No loss of vertebral body height. Probable fracture the superior endplate of Z36. No loss vertebral body height. No paraspinal or epidural hematoma evident. IMPRESSION: 1. No pneumothorax. 2. Bilateral small pleural effusions and medial basilar atelectasis. 3. Subcutaneous emphysema and pneumomediastinum not changed. 4. Posterior RIGHT seventh and eighth rib fractures. 5. BILATERAL SUBACUTE PEDICLE FRACTURES AT T10. These are UNSTABLE fractures. Recommend nonemergent neurosurgical consultation. 6. Probable superior endplate fracture U44. Findings conveyed toFloor nurse, Chama on 08/24/2016  at18:36. Electronically Signed   By: Suzy Bouchard M.D.   On: 08/24/2016 18:44   Mr Thoracic Spine Wo Contrast  Result Date: 08/24/2016 CLINICAL DATA:  Follow-up T10 unstable fracture incidentally noted during CT-guided thoracentesis. EXAM: MRI THORACIC SPINE WITHOUT CONTRAST TECHNIQUE: Multiplanar, multisequence MR imaging of the thoracic spine  was performed. No intravenous contrast was administered. COMPARISON:  CT chest August 24, 2016 at 1731 hours FINDINGS: ALIGNMENT: Maintenance of the thoracic kyphosis. No malalignment. VERTEBRAE/DISCS: Low T1 and bright STIR signal within the T10 and T11 vertebral bodies extending into the posterior elements. Linear low T1 through T10 posterior elements corresponding to known fracture. No retropulsed bony fragments. Mild (less than 20%) T11 vertebral body height loss with bright STIR signal within the T10-11 disc, to lesser extent at T9-10. Multilevel mild chronic discogenic endplate changes. Intervertebral disc heights generally preserved with slight desiccation and mineralization is evidenced on today's CT. CORD: Thoracic spinal cord is normal morphology and signal characteristics to the level of the conus medullaris which terminates at T12-L1. PREVERTEBRAL AND PARASPINAL SOFT TISSUES: T2 bright signal within the anterior longitudinal ligament T10-11, within the interspinous and supraspinous ligaments at T10-11. Mild bright STIR paraspinal muscle strain T10-11 and more caudal levels. Dependent pleural effusion and consolidation partially imaged. T2 bright cysts in the kidneys. DISC LEVELS: No disc bulge, canal stenosis or neural foraminal narrowing any level. IMPRESSION: Acute appearing T10-3 column fracture without height loss. Mild T11 acute compression fracture and edema extending to the posterior elements. Focally disrupted T10-11 ligaments. Low-grade lower thoracic paraspinal muscle strain. No neurocompression. Electronically Signed   By: Elon Alas M.D.   On: 08/24/2016 23:50   Dg Chest Port 1 View  Result Date: 08/25/2016 CLINICAL DATA:  83 year old male status post fall with chest trauma including T10 Chance type fracture, right rib fractures, pleural effusions, pneumomediastinum and subcutaneous gas. EXAM: PORTABLE CHEST 1 VIEW COMPARISON:  Chest CT 08/24/2016 and earlier. FINDINGS: Portable AP  semi upright view at 0645 hours. Stable mild right chest wall subcutaneous gas. Improved lung volumes since 08/23/2016. Mediastinal contours remain normal. No pneumothorax. No pulmonary edema. No pleural effusion is evident. No confluent pulmonary opacity. Stable right thoracic inlet surgical clips. Stable visualized osseous structures. IMPRESSION: 1. Improved lung volumes and ventilation since 08/23/2016. Pleural effusions are no longer evident, no confluent pulmonary opacity. 2. T10 Chance type fracture and right rib fractures better demonstrated on yesterday CT and MRI. Electronically Signed   By: Genevie Ann M.D.   On: 08/25/2016 07:34   US Thoracentesis Asp Pleural Space W/img Guide  Result Date: 08/25/2016 INDICATION: Patient with history of fall now with right pleural effusion. Request  is made for diagnostic and therapeutic thoracentesis. EXAM: ULTRASOUND GUIDED DIAGNOSTIC AND THERAPEUTIC RIGHT THORACENTESIS MEDICATIONS: 10 mL 1% lidocaine COMPLICATIONS: None immediate. PROCEDURE: An ultrasound guided thoracentesis was thoroughly discussed with the patient and questions answered. The benefits, risks, alternatives and complications were also discussed. The patient understands and wishes to proceed with the procedure. Written consent was obtained. Ultrasound was performed to localize and mark an adequate pocket of fluid in the right chest. The area was then prepped and draped in the normal sterile fashion. 1% Lidocaine was used for local anesthesia. Under ultrasound guidance a Safe-T-centesis catheter was introduced. Thoracentesis was performed. The catheter was removed and a dressing applied. FINDINGS: A total of approximately 900 mL of bloody fluid was removed. Samples were sent to the laboratory as requested by the clinical team. IMPRESSION: Successful ultrasound guided diagnostic and therapeutic right thoracentesis yielding 900 mL of pleural fluid. Read by:  Brynda Greathouse PA-C Electronically Signed   By:  Sandi Mariscal M.D.   On: 08/24/2016 17:25   Scheduled Meds: . sodium chloride   Intravenous Once  . aspirin EC  81 mg Oral Daily  . cyclobenzaprine  5 mg Oral TID  . docusate sodium  100 mg Oral BID  . [START ON 08/27/2016] enoxaparin (LOVENOX) injection  40 mg Subcutaneous Q24H  . escitalopram  20 mg Oral Daily  . feeding supplement (ENSURE ENLIVE)  237 mL Oral BID BM  . hydrochlorothiazide  12.5 mg Oral Daily  . ipratropium-albuterol  3 mL Nebulization BID  . irbesartan  75 mg Oral Daily  . pantoprazole  40 mg Oral Daily  . polyethylene glycol  17 g Oral Daily  . simvastatin  20 mg Oral QHS  . traMADol  50 mg Oral Q6H   Continuous Infusions:   LOS: 13 days   Time Spent in minutes   30 minutes  Romilda Joy, Crisanto Nied MD on 08/26/2016 at 9:29 AM  Between 7am to 7pm - Pager - [phone]  After 7pm go to [email or web address] - password [email or web address]  And look for the night coverage person covering for me after hours  [facility] Office  [phone]

## 2016-08-27 LAB — CBC
HCT: 33 % — ABNORMAL LOW (ref 39.0–52.0)
Hemoglobin: 10.6 g/dL — ABNORMAL LOW (ref 13.0–17.0)
MCH: 28.8 pg (ref 26.0–34.0)
MCHC: 32.1 g/dL (ref 30.0–36.0)
MCV: 89.7 fL (ref 78.0–100.0)
PLATELETS: 262 10*3/uL (ref 150–400)
RBC: 3.68 MIL/uL — ABNORMAL LOW (ref 4.22–5.81)
RDW: 14 % (ref 11.5–15.5)
WBC: 6.2 10*3/uL (ref 4.0–10.5)

## 2016-08-27 LAB — BASIC METABOLIC PANEL
Anion gap: 11 (ref 5–15)
BUN: 12 mg/dL (ref 6–20)
CALCIUM: 8.3 mg/dL — AB (ref 8.9–10.3)
CO2: 26 mmol/L (ref 22–32)
CREATININE: 0.92 mg/dL (ref 0.61–1.24)
Chloride: 98 mmol/L — ABNORMAL LOW (ref 101–111)
Glucose, Bld: 85 mg/dL (ref 65–99)
Potassium: 3.9 mmol/L (ref 3.5–5.1)
SODIUM: 135 mmol/L (ref 135–145)

## 2016-08-27 MED ORDER — MAGIC MOUTHWASH W/LIDOCAINE
10.0000 mL | Freq: Four times a day (QID) | ORAL | Status: DC
  Administered 2016-08-27 – 2016-08-28 (×3): 10 mL via ORAL
  Filled 2016-08-27 (×5): qty 10

## 2016-08-27 NOTE — Progress Notes (Signed)
Rehab admissions - I am cancelling the admit to acute inpatient rehab today per request of Dr. Hulen Skains.  We will plan to admit to acute inpatient rehab tomorrow, Sunday.  Call me for questions.  #[phone]

## 2016-08-27 NOTE — Progress Notes (Signed)
  Subjective: Complains of sore throat and tongue Denies SOB  Objective: Vital signs in last 24 hours: Temp:  [98.1 F (36.7 C)-99.2 F (37.3 C)] 98.2 F (36.8 C) (03/17 0821) Pulse Rate:  [69-80] 76 (03/17 0821) Resp:  [16-26] 16 (03/17 0821) BP: (122-164)/(52-83) 122/58 (03/17 0821) SpO2:  [92 %-97 %] 93 % (03/17 0821) Weight:  [74.3 kg (163 lb 12.8 oz)] 74.3 kg (163 lb 12.8 oz) (03/17 0407) Last BM Date: 08/25/16  Intake/Output from previous day: 03/16 0701 - 03/17 0700 In: 600 [P.O.:600] Out: 1775 [Urine:1775] Intake/Output this shift: Total I/O In: 10 [P.O.:10] Out: -   Exam: Oral:  Thrush Lungs clear Abdomen soft, NT  Lab Results:   Recent Labs  08/26/16 0441 08/27/16 0753  WBC 6.0 6.2  HGB 9.8* 10.6*  HCT 30.3* 33.0*  PLT 229 262   BMET  Recent Labs  08/26/16 0441 08/27/16 0753  NA 137 135  K 3.9 3.9  CL 100* 98*  CO2 29 26  GLUCOSE 95 85  BUN 16 12  CREATININE 1.06 0.92  CALCIUM 8.3* 8.3*   PT/INR  Recent Labs  08/26/16 0805  LABPROT 14.9  INR 1.17   ABG No results for input(s): PHART, HCO3 in the last 72 hours.  Invalid input(s): PCO2, PO2  Studies/Results: Dg Thoracic Spine 2 View  Result Date: 08/26/2016 CLINICAL DATA:  T10 vertebral fracture EXAM: THORACIC SPINE 2 VIEWS COMPARISON:  08/24/2016, 08/13/2016 FINDINGS: Mild loss of height of T10 and T11 vertebral bodies, stable compared with MRI it T11, possibly slightly progressed at T10. Mild kyphosis at T10-T11. Posterior extent of the fractures at T10 and 11 is better seen on the cross-sectional images. Multilevel degenerative osteophytes. Remaining vertebral bodies demonstrate normal stature. IMPRESSION: Minimal kyphosis at T10-T11 with mild loss of the vertebral body heights at these levels. Electronically Signed   By: Donavan Foil M.D.   On: 08/26/2016 20:26    Anti-infectives: Anti-infectives    None      Assessment/Plan:   LOS: 14 days  20-25 foot fall from  tree Right PTX with subcutaneous emphysema Right sided costochondral fractures (4-6) sternum is intact Small anterior mediastinal hematoma R pleural effusion - S/P thoracentesis 3/14 Hemoperitoneum- Hb stable and abd exam better T 10 FX brace at bedside. PT to ambulate Right ingunial hernia containing loops of small bowel and colonic diverticulosis- now reduced as seen on CT VTE- resume Lovenox FEN- advance diet Disposition/Plan- continue current care Swish and Swallow meds for thrush  [name] A 08/27/2016

## 2016-08-27 NOTE — Plan of Care (Signed)
Problem: Safety: Goal: Ability to remain free from injury will improve Outcome: Progressing Patient has his call light in his bed with him, reviewed how to use the buttons on his bed, the call light and the white phone/board with numbers for his RN/NT, will continue to reinforce and monitor. Bedside stand is beside the bed with his personal items within reach, his bed alarm is on now and patient is aware that it's on and why.

## 2016-08-27 NOTE — Progress Notes (Signed)
Patient ID: [name], male   DOB: 10-Oct-1927, 83 y.o.   MRN: [id number] BP (!) 134/107 (BP Location: Right Arm)   Pulse 79   Temp 98.8 F (37.1 C) (Oral)   Resp (!) 26   Ht 5\' 9"  (1.753 m)   Wt 82 kg (180 lb 11.2 oz)   SpO2 92%   BMI 26.68 kg/m  I have spoken with a daughter and explained my rationale for not treating the compression fracture and trying to treat the T10 fracture with bracing. They asked for and received a consult from dr. Ellene Route. There is no rationale for kyphoplasty of the T11 compression fracture.

## 2016-08-27 NOTE — Progress Notes (Signed)
PT Cancellation Note  Patient Details Name: [name] MRN: [id number] DOB: [birth year]   Cancelled Treatment:    Reason Eval/Treat Not Completed: Medical issues which prohibited therapy. Pt remains on bedrest, will continue to follow and evaluate when appropriate.   Enis Gash, SPT [phone]  Mabeline Caras 08/27/2016, 2:21 PM

## 2016-08-27 NOTE — Plan of Care (Signed)
Problem: Pain Managment: Goal: General experience of comfort will improve Outcome: Progressing Assisted with repositioning patient as he needs and will medicate for pain as ordered and needed, will continue to monitor effects.

## 2016-08-28 ENCOUNTER — Inpatient Hospital Stay (HOSPITAL_COMMUNITY): Payer: Medicare Other | Admitting: Physical Therapy

## 2016-08-28 ENCOUNTER — Inpatient Hospital Stay (HOSPITAL_COMMUNITY): Payer: Medicare Other

## 2016-08-28 ENCOUNTER — Inpatient Hospital Stay (HOSPITAL_COMMUNITY)
Admission: RE | Admit: 2016-08-28 | Discharge: 2016-09-06 | DRG: 560 | Disposition: A | Discharge status: Home Health | Payer: Medicare Other | Source: Intra-hospital | Attending: Physical Medicine & Rehabilitation | Admitting: Physical Medicine & Rehabilitation

## 2016-08-28 DIAGNOSIS — K219 Gastro-esophageal reflux disease without esophagitis: Secondary | ICD-10-CM | POA: Diagnosis present

## 2016-08-28 DIAGNOSIS — S22039S Unspecified fracture of third thoracic vertebra, sequela: Secondary | ICD-10-CM | POA: Diagnosis not present

## 2016-08-28 DIAGNOSIS — I10 Essential (primary) hypertension: Secondary | ICD-10-CM | POA: Diagnosis not present

## 2016-08-28 DIAGNOSIS — I82441 Acute embolism and thrombosis of right tibial vein: Secondary | ICD-10-CM | POA: Diagnosis present

## 2016-08-28 DIAGNOSIS — K567 Ileus, unspecified: Secondary | ICD-10-CM | POA: Diagnosis not present

## 2016-08-28 DIAGNOSIS — Z79899 Other long term (current) drug therapy: Secondary | ICD-10-CM | POA: Diagnosis not present

## 2016-08-28 DIAGNOSIS — R0789 Other chest pain: Secondary | ICD-10-CM

## 2016-08-28 DIAGNOSIS — K668 Other specified disorders of peritoneum: Secondary | ICD-10-CM | POA: Diagnosis present

## 2016-08-28 DIAGNOSIS — Z9841 Cataract extraction status, right eye: Secondary | ICD-10-CM | POA: Diagnosis not present

## 2016-08-28 DIAGNOSIS — Z87891 Personal history of nicotine dependence: Secondary | ICD-10-CM

## 2016-08-28 DIAGNOSIS — S22079S Unspecified fracture of T9-T10 vertebra, sequela: Secondary | ICD-10-CM | POA: Diagnosis not present

## 2016-08-28 DIAGNOSIS — W14XXXD Fall from tree, subsequent encounter: Secondary | ICD-10-CM

## 2016-08-28 DIAGNOSIS — F329 Major depressive disorder, single episode, unspecified: Secondary | ICD-10-CM | POA: Diagnosis present

## 2016-08-28 DIAGNOSIS — D62 Acute posthemorrhagic anemia: Secondary | ICD-10-CM | POA: Diagnosis present

## 2016-08-28 DIAGNOSIS — Z9842 Cataract extraction status, left eye: Secondary | ICD-10-CM | POA: Diagnosis not present

## 2016-08-28 DIAGNOSIS — B379 Candidiasis, unspecified: Secondary | ICD-10-CM | POA: Diagnosis present

## 2016-08-28 DIAGNOSIS — S22079D Unspecified fracture of T9-T10 vertebra, subsequent encounter for fracture with routine healing: Principal | ICD-10-CM

## 2016-08-28 DIAGNOSIS — Z7982 Long term (current) use of aspirin: Secondary | ICD-10-CM | POA: Diagnosis not present

## 2016-08-28 DIAGNOSIS — B370 Candidal stomatitis: Secondary | ICD-10-CM

## 2016-08-28 DIAGNOSIS — E785 Hyperlipidemia, unspecified: Secondary | ICD-10-CM | POA: Diagnosis present

## 2016-08-28 DIAGNOSIS — Z86718 Personal history of other venous thrombosis and embolism: Secondary | ICD-10-CM | POA: Diagnosis not present

## 2016-08-28 DIAGNOSIS — J029 Acute pharyngitis, unspecified: Secondary | ICD-10-CM | POA: Diagnosis present

## 2016-08-28 DIAGNOSIS — S22079A Unspecified fracture of T9-T10 vertebra, initial encounter for closed fracture: Secondary | ICD-10-CM

## 2016-08-28 DIAGNOSIS — K5901 Slow transit constipation: Secondary | ICD-10-CM

## 2016-08-28 DIAGNOSIS — Z961 Presence of intraocular lens: Secondary | ICD-10-CM | POA: Diagnosis present

## 2016-08-28 DIAGNOSIS — T797XXD Traumatic subcutaneous emphysema, subsequent encounter: Secondary | ICD-10-CM | POA: Diagnosis not present

## 2016-08-28 DIAGNOSIS — H9190 Unspecified hearing loss, unspecified ear: Secondary | ICD-10-CM | POA: Diagnosis present

## 2016-08-28 DIAGNOSIS — M7989 Other specified soft tissue disorders: Secondary | ICD-10-CM | POA: Diagnosis not present

## 2016-08-28 DIAGNOSIS — Z888 Allergy status to other drugs, medicaments and biological substances status: Secondary | ICD-10-CM | POA: Diagnosis not present

## 2016-08-28 MED ORDER — BISACODYL 10 MG RE SUPP
10.0000 mg | Freq: Every day | RECTAL | Status: DC | PRN
  Administered 2016-08-28 – 2016-08-30 (×2): 10 mg via RECTAL
  Filled 2016-08-28 (×2): qty 1

## 2016-08-28 MED ORDER — HYDROCHLOROTHIAZIDE 12.5 MG PO CAPS
12.5000 mg | ORAL_CAPSULE | Freq: Every day | ORAL | Status: DC
  Administered 2016-08-29 – 2016-09-06 (×9): 12.5 mg via ORAL
  Filled 2016-08-28 (×9): qty 1

## 2016-08-28 MED ORDER — POLYETHYLENE GLYCOL 3350 17 G PO PACK
17.0000 g | PACK | Freq: Every day | ORAL | Status: DC
  Administered 2016-08-29 – 2016-09-06 (×9): 17 g via ORAL
  Filled 2016-08-28 (×9): qty 1

## 2016-08-28 MED ORDER — IPRATROPIUM-ALBUTEROL 0.5-2.5 (3) MG/3ML IN SOLN
3.0000 mL | RESPIRATORY_TRACT | Status: DC | PRN
  Administered 2016-09-01 (×2): 3 mL via RESPIRATORY_TRACT
  Filled 2016-08-28 (×2): qty 3

## 2016-08-28 MED ORDER — ONDANSETRON HCL 4 MG PO TABS: 4.0000 mg | ORAL_TABLET | Freq: Four times a day (QID) | ORAL | Status: DC | PRN

## 2016-08-28 MED ORDER — LINACLOTIDE 145 MCG PO CAPS
145.0000 ug | ORAL_CAPSULE | Freq: Every day | ORAL | Status: DC
  Administered 2016-08-29 – 2016-09-06 (×9): 145 ug via ORAL
  Filled 2016-08-28 (×11): qty 1

## 2016-08-28 MED ORDER — TRAMADOL HCL 50 MG PO TABS
50.0000 mg | ORAL_TABLET | Freq: Four times a day (QID) | ORAL | Status: DC
  Administered 2016-08-28 – 2016-09-06 (×27): 50 mg via ORAL
  Filled 2016-08-28 (×30): qty 1

## 2016-08-28 MED ORDER — PANTOPRAZOLE SODIUM 40 MG PO TBEC
40.0000 mg | DELAYED_RELEASE_TABLET | Freq: Every day | ORAL | Status: DC
  Administered 2016-08-29 – 2016-09-06 (×9): 40 mg via ORAL
  Filled 2016-08-28 (×9): qty 1

## 2016-08-28 MED ORDER — ASPIRIN EC 81 MG PO TBEC
81.0000 mg | DELAYED_RELEASE_TABLET | Freq: Every day | ORAL | Status: DC
  Administered 2016-08-29 – 2016-09-06 (×9): 81 mg via ORAL
  Filled 2016-08-28 (×9): qty 1

## 2016-08-28 MED ORDER — SIMVASTATIN 20 MG PO TABS
20.0000 mg | ORAL_TABLET | Freq: Every day | ORAL | Status: DC
  Administered 2016-08-28 – 2016-09-05 (×8): 20 mg via ORAL
  Filled 2016-08-28 (×9): qty 1

## 2016-08-28 MED ORDER — IRBESARTAN 75 MG PO TABS
75.0000 mg | ORAL_TABLET | Freq: Every day | ORAL | Status: DC
  Administered 2016-08-29 – 2016-09-06 (×9): 75 mg via ORAL
  Filled 2016-08-28 (×9): qty 1

## 2016-08-28 MED ORDER — ENOXAPARIN SODIUM 40 MG/0.4ML ~~LOC~~ SOLN: 40.0000 mg | SUBCUTANEOUS | Status: DC

## 2016-08-28 MED ORDER — ENOXAPARIN SODIUM 40 MG/0.4ML ~~LOC~~ SOLN
40.0000 mg | SUBCUTANEOUS | Status: DC
  Administered 2016-08-28 – 2016-09-05 (×9): 40 mg via SUBCUTANEOUS
  Filled 2016-08-28 (×8): qty 0.4

## 2016-08-28 MED ORDER — ESCITALOPRAM OXALATE 10 MG PO TABS
20.0000 mg | ORAL_TABLET | Freq: Every day | ORAL | Status: DC
  Administered 2016-08-29 – 2016-09-06 (×9): 20 mg via ORAL
  Filled 2016-08-28 (×9): qty 2

## 2016-08-28 MED ORDER — DOCUSATE SODIUM 100 MG PO CAPS
100.0000 mg | ORAL_CAPSULE | Freq: Two times a day (BID) | ORAL | Status: DC
  Administered 2016-08-28 – 2016-09-06 (×16): 100 mg via ORAL
  Filled 2016-08-28 (×19): qty 1

## 2016-08-28 MED ORDER — CYCLOBENZAPRINE HCL 5 MG PO TABS
5.0000 mg | ORAL_TABLET | Freq: Three times a day (TID) | ORAL | Status: DC
  Administered 2016-08-28 – 2016-09-05 (×24): 5 mg via ORAL
  Filled 2016-08-28 (×25): qty 1

## 2016-08-28 MED ORDER — ASPIRIN EC 81 MG PO TBEC: 81.0000 mg | DELAYED_RELEASE_TABLET | Freq: Every day | ORAL | Status: DC

## 2016-08-28 MED ORDER — ONDANSETRON HCL 4 MG/2ML IJ SOLN: 4.0000 mg | Freq: Four times a day (QID) | INTRAMUSCULAR | Status: DC | PRN

## 2016-08-28 MED ORDER — SORBITOL 70 % SOLN
30.0000 mL | Freq: Every day | Status: DC | PRN
  Administered 2016-09-03: 30 mL via ORAL
  Filled 2016-08-28: qty 30

## 2016-08-28 MED ORDER — FLUCONAZOLE 100 MG PO TABS
100.0000 mg | ORAL_TABLET | Freq: Every day | ORAL | Status: DC
  Administered 2016-08-28 – 2016-09-01 (×5): 100 mg via ORAL
  Filled 2016-08-28 (×5): qty 1

## 2016-08-28 NOTE — Evaluation (Signed)
Physical Therapy Assessment and Plan  Patient Details  Name: [name] MRN: [id number] Date of Birth: 05/20/1928  PT Diagnosis: Abnormality of gait, Coordination disorder, Difficulty walking and Muscle weakness Rehab Potential: Excellent ELOS: 1-2 weeks   Today's Date: 08/28/2016 PT Individual Time: 7829-5621 PT Individual Time Calculation (min): 42 min    Problem List:  Patient Active Problem List   Diagnosis Date Noted  . Abdominal distention   . Benign essential HTN   . Pneumoperitoneum   . Pain   . Acute blood loss anemia   . Tachypnea   . Hemoperitoneum   . Adynamic ileus ([place])   . Nausea in adult   . Acute diastolic heart failure ([place])   . Ileus ([place]) 08/20/2016  . Dysphagia 08/20/2016  . Chest pain 08/20/2016  . Pneumothorax 08/13/2016  . 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 Aortic stenosis 06/20/2014  . Chest tightness 06/20/2014  . Essential hypertension 06/20/2014  . Insomnia 06/20/2014  . Sinus bradycardia 06/20/2014  . Rectal bleeding 01/20/2014  . Aortic valve disorders 11/07/2013  . Rotator cuff syndrome of right shoulder 08/01/2013    Past Medical History:  Past Medical History:  Diagnosis Date  . At risk for sleep apnea    STOP-BANG= 4    SENT TO PCP 04-04-2014  . BPH (benign prostatic hypertrophy)   . Depression   . Diverticulosis of colon   . GERD (gastroesophageal reflux disease)   . Gilbert's syndrome   . Headache    migraines  . Hearing loss   . Heart murmur    "Calcified aortic valve"  . Hepatitis    Hep A as a child  . History of gastroenteritis   . History of kidney stones   . History of syncope    2002--  vasovagal  . HOH (hard of hearing)    refuses to wear his Hearing Aid  . Hypertension   . Inguinal hernia    right  . Internal hemorrhoid, bleeding   . Moderate aortic stenosis    a. Last echo 11/2013, 06/2014.  Marland Kitchen Nocturia   . Normal coronary arteries    a. Cath 07/2010: normal  coronaries. Felt to have noncardiac CP/SOB at that time possibly r/t anxiety.  . RA (rheumatoid arthritis) ([place])    bilateral hands/ wrist--  seronegative  . Rotator cuff syndrome of right shoulder 08/01/2013  . Shortness of breath dyspnea    with exertion  . Stone, kidney   . Thyroid cyst   . Thyroid nodule    noted 11-2009  . Wears dentures    Past Surgical History:  Past Surgical History:  Procedure Laterality Date  . CARDIAC CATHETERIZATION  07-19-2010  dr Tressia Miners turner   normal coronary arteries,  ef 60%,  moderate aortic stenosis- gradient 85mHg, normal right heart pressure  . CARDIOVASCULAR STRESS TEST  05/ 2011   dr sMarlou Porch  normal low risk perfusion study  . CATARACT EXTRACTION W/ INTRAOCULAR LENS  IMPLANT, BILATERAL  2013  . COLONOSCOPY  2010  approx  . THYROID LOBECTOMY Right 05/05/2015   Procedure: RIGHT THYROID LOBECTOMY;  Surgeon: TArmandina Gemma MD;  [facility]  Service: General;  Laterality: Right;  . TRANSANAL HEMORRHOIDAL DEARTERIALIZATION N/A 04/09/2014   Procedure: TRANSANAL HEMORRHOIDAL DEARTERIALIZATION OF INTERNAL HEMORROIDS;  Surgeon: ALeighton Ruff MD;  [facility]  Service: General;  Laterality: N/A;  . TRANSTHORACIC ECHOCARDIOGRAM  11-26-2013   moderate focal basal and mild LVH/  ef 29-51%/  grade I diastolic dysfunction/ mild LAE/  moderate calcification with stenosis AV with mild regurg,  gradients 35 abd 58 mmHg /  mild TR    Assessment & Plan Clinical Impression: Patient is a 83 y.o. year old male with history of hypertension. Per chart review patient lives alone. His daughter lives in the home next to him. Patient reported to be independent prior to admission. Question of 24 hour assistance on discharge. Presented 08/13/2016 after a fall from a tree while cutting branches. No report of loss of consciousness. Complaints of back pain. Cranial CT reviewed, unremarkable for acute process. CT cervical spine negative. CT of the  chest abdomen and pelvis showed a right pneumothorax with right chest wall subcutaneous emphysema. Right costochondral fractures. Small anterior mediastinal hematoma. No evidence for great vessel injury or acute intra-abdominal injury. Conservative care of right pneumothorax.MRI thoracic spine for increasing back pain 08/24/2016 showed acute appearing T10 fracture without height loss. Mild T11 acute compression fracture and edema extending to the posterior element. No neurocompression. Neurosurgery Dr. Cyndy Freeze consulted advise conservative care with TLSO back brace applied. Patient underwent a right thoracentesis for right pleural effusion 08/24/2016 with a 900 mL yield of bloody fluid removed.Decrease in hemoglobin with follow-up CT abdomen and pelvis showed pneumoperitoneum of which patient was transfused close monitoring of CBC with latest hemoglobin 8.5. Patient developed nausea abdominal bloating as well as reports of chronic dysphagia with gastroenterology consulted 08/20/2016. A modified barium swallow was completedand advised regular diet. A plain abdominal film suggested mild large and small bowel ileus and no signs of peritonitis. A nasogastric tube was placed for decompressionand Diet slowly advanced. Hospital course pain management.Patient did have bouts of agitation and restlessness and was provided with a sitter for safety. Physical occupational therapy evaluations completed. Recommendations for physical medicine rehabilitation consult. Patient to be admitted for a comprehensive inpatient rehabilitation program.  Patient transferred to CIR on 08/28/2016 .   Patient currently requires min with mobility secondary to muscle weakness, decreased cardiorespiratoy endurance, decreased coordination, decreased safety awareness, and decreased sitting balance, decreased standing balance, decreased postural control and decreased balance strategies.  Prior to hospitalization, patient was independent  with  mobility and lived with Alone (pt was living alone PTA but will d/c to daughter's house; provided information regarding daughter's home setup) in a House home.  Home access is 3Stairs to enter.  Patient will benefit from skilled PT intervention to maximize safe functional mobility, minimize fall risk and decrease caregiver burden for planned discharge home with intermittent assist.  Anticipate patient will benefit from follow up [facility] at discharge.  PT - End of Session Activity Tolerance: Tolerates 30+ min activity with multiple rests Endurance Deficit: Yes Endurance Deficit Description: 2/2 poor cardiopulmonary endurance PT Assessment Rehab Potential (ACUTE/IP ONLY): Excellent PT Patient demonstrates impairments in the following area(s): Balance;Endurance;Motor;Safety;Sensory PT Transfers Functional Problem(s): Bed Mobility;Bed to Chair;Car;Furniture PT Locomotion Functional Problem(s): Ambulation;Wheelchair Mobility;Stairs PT Plan PT Intensity: Minimum of 1-2 x/day ,45 to 90 minutes PT Frequency: 5 out of 7 days PT Duration Estimated Length of Stay: 1-2 weeks PT Treatment/Interventions: Ambulation/gait training;Community reintegration;DME/adaptive equipment instruction;Neuromuscular re-education;Psychosocial support;Stair training;UE/LE Strength taining/ROM;Wheelchair propulsion/positioning;UE/LE Coordination activities;Therapeutic Activities;Pain management;Discharge planning;Balance/vestibular training;Disease management/prevention;Cognitive remediation/compensation;Functional mobility training;Patient/family education;Splinting/orthotics;Therapeutic Exercise;Visual/perceptual remediation/compensation PT Transfers Anticipated Outcome(s): Mod I with LRAD PT Locomotion Anticipated Outcome(s): Mod I with LRAD PT Recommendation Recommendations for Other Services: Therapeutic Recreation consult Follow Up Recommendations: Home health PT (intermittent supervision) Patient destination:  Home Equipment Recommended: To be determined  Skilled Therapeutic  Intervention Patient received in w/c with TLSO already donned. Pt's daughter [name] present for session. [name] reports that she works in [place] [employer] but intends for pt to d/c home to her house in [place] and she will hire caregivers if necessary. PT evaluation initiated with therapist educating pt & daughter on ELOS, daily therapy schedule, and weekly interdisciplinary team meetings. Transported pt to rehab gym & pt performed transfers (sit<>stand and w/c<>car at sedan simulated height) with min assist overall. Pt is able to negotiate stairs, ambulate over even surfaces & over a ramp with min assist HHA, but does require mod assist to negotiate uneven surfaces with HHA. Pt fatigues quickly and requires rest breaks. Educated Dora on pt's inability to drive until cleared by MD. At end of session pt left sitting in w/c in room with all needs within reach, QRB donned, & daughter present.   PT Evaluation Precautions/Restrictions Precautions Precautions: Fall;Back Required Braces or Orthoses: Spinal Brace Spinal Brace: Thoracolumbosacral orthotic;Applied in sitting position Restrictions Weight Bearing Restrictions: No  General Chart Reviewed: Yes Additional Pertinent History: BPH, depression, heart murmur, Hepatitis A (as a child), hx of syncope, HTN, inguinal hernia, moderate aortic stenosis, RA, Right rotator cuff syndrome, dyspnea Response to Previous Treatment: Patient with no complaints from previous session. Family/Caregiver Present: Yes (daughter [name])   Vital Signs Therapy Vitals HR = 79 bpm SpO2 = 97% on room air  Pain Pt denies c/o pain.  Home Living/Prior Functioning Home Living Available Help at Discharge: Family (daughter willing to hire assistance) Type of Home: House Home Access: Stairs to enter Technical brewer of Steps: 3 Entrance Stairs-Rails: None Home Layout: One level  Lives With: Alone (pt was  living alone PTA but will d/c to daughter's house; provided information regarding daughter's home setup) Prior Function Level of Independence: Independent with basic ADLs;Independent with homemaking with ambulation;Independent with transfers;Independent with gait  Able to Take Stairs?: Yes Driving: Yes   Vision/Perception  Pt does not wear glasses. No apparent visual deficits. Cognition Overall Cognitive Status: Difficult to assess Arousal/Alertness: Awake/alert Orientation Level: Oriented to person;Oriented to place;Oriented to time;Oriented to situation Safety/Judgment: Impaired   Sensation Coordination Gross Motor Movements are Fluid and Coordinated: No Coordination and Movement Description: impaired coordination LLE   Motor  Motor Motor - Skilled Clinical Observations:  (generalized weakness)   Mobility Transfers Transfers: Yes Sit to Stand: 4: Min assist Sit to Stand Details: Verbal cues for technique;Verbal cues for precautions/safety Stand to Sit: 4: Min assist Stand to Sit Details (indicate cue type and reason): Verbal cues for technique;Verbal cues for precautions/safety   Locomotion  Ambulation Ambulation: Yes Ambulation/Gait Assistance: 4: Min assist Ambulation Distance (Feet): 100 Feet Assistive device: 1 person hand held assist Gait Gait: Yes Gait Pattern: Decreased step length - left (impaired coordination LLE, decreased weight shifting) Stairs / Additional Locomotion Stairs: Yes Stairs Assistance: 4: Min assist Stair Management Technique: Two rails Number of Stairs: 12 Height of Stairs:  (6" + 3") Ramp: 4: Min assist Curb: 4: Min assist (BUE support) Product manager Mobility: No   Balance Balance Balance Assessed: Yes Dynamic Standing Balance Dynamic Standing - Balance Support:  (single UE supported) Dynamic Standing - Level of Assistance: 4: Min assist Dynamic Standing - Balance Activities:  (during gait)   Extremity Assessment   RLE Assessment RLE Assessment: Within Functional Limits LLE Assessment LLE Assessment: Within Functional Limits   See Function Navigator for Current Functional Status.   Refer to Care Plan for Long Term Goals  Recommendations  for other services: Therapeutic Recreation  Pet therapy  Discharge Criteria: Patient will be discharged from PT if patient refuses treatment 3 consecutive times without medical reason, if treatment goals not met, if there is a change in medical status, if patient makes no progress towards goals or if patient is discharged from hospital.  The above assessment, treatment plan, treatment alternatives and goals were discussed and mutually agreed upon: by patient and by family  [place] 08/28/2016, 5:43 PM

## 2016-08-28 NOTE — Progress Notes (Signed)
Admitted to unit via bed, oriented to unit, rehab schedule, plan of care and medications. Daughter Noland Fordyce at beside and states an understanding of information provided. Concerned about no bowel movement and reviewed plan to administer supp after therapy session today. No other questions noted. [name]

## 2016-08-28 NOTE — Progress Notes (Signed)
Cyree Chuong is a 83 y.o. male Jan 14, 1928 [id number]  Subjective: The pt was transferred to 4W this am C/o constipation C/o mouth pain and ST x 2-3 d C/o chest pain w/breathing  Objective: Vital signs in last 24 hours: Temp:  [97.8 F (36.6 C)-98.5 F (36.9 C)] 97.8 F (36.6 C) (03/18 1536) Pulse Rate:  [69-75] 75 (03/18 1536) Resp:  [16-22] 17 (03/18 1536) BP: (102-152)/(53-78) 102/53 (03/18 1536) SpO2:  [94 %-100 %] 95 % (03/18 1536) Weight:  [164 lb 9.6 oz (74.7 kg)-165 lb 12.6 oz (75.2 kg)] 165 lb 12.6 oz (75.2 kg) (03/18 1050) Weight change:  Last BM Date: 08/25/16  Intake/Output from previous day: No intake/output data recorded. Last cbgs: CBG (last 3)  No results for input(s): GLUCAP in the last 72 hours.   Physical Exam General: No apparent distress   HEENT: thrush Lungs: Normal effort. Lungs w/decreased BS, no crackles or wheezes. Cardiovascular: Regular rate and rhythm, no edema Abdomen: distended and NT; BS(+) - slow Musculoskeletal:  Tender chest Neurological: No new neurological deficits Wounds: N/A    Skin: clear  Aging changes Mental state: Alert, oriented, cooperative    Lab Results: BMET    Component Value Date/Time   NA 135 08/27/2016 0753   K 3.9 08/27/2016 0753   CL 98 (L) 08/27/2016 0753   CO2 26 08/27/2016 0753   GLUCOSE 85 08/27/2016 0753   BUN 12 08/27/2016 0753   CREATININE 0.92 08/27/2016 0753   CALCIUM 8.3 (L) 08/27/2016 0753   GFRNONAA >60 08/27/2016 0753   GFRAA >60 08/27/2016 0753   CBC    Component Value Date/Time   WBC 6.2 08/27/2016 0753   RBC 3.68 (L) 08/27/2016 0753   HGB 10.6 (L) 08/27/2016 0753   HCT 33.0 (L) 08/27/2016 0753   PLT 262 08/27/2016 0753   MCV 89.7 08/27/2016 0753   MCH 28.8 08/27/2016 0753   MCHC 32.1 08/27/2016 0753   RDW 14.0 08/27/2016 0753   LYMPHSABS 1.4 04/13/2014 2237   MONOABS 0.8 04/13/2014 2237   EOSABS 0.2 04/13/2014 2237   BASOSABS 0.0 04/13/2014 2237    Studies/Results: Dg  Thoracic Spine 2 View  Result Date: 08/26/2016 CLINICAL DATA:  T10 vertebral fracture EXAM: THORACIC SPINE 2 VIEWS COMPARISON:  08/24/2016, 08/13/2016 FINDINGS: Mild loss of height of T10 and T11 vertebral bodies, stable compared with MRI it T11, possibly slightly progressed at T10. Mild kyphosis at T10-T11. Posterior extent of the fractures at T10 and 11 is better seen on the cross-sectional images. Multilevel degenerative osteophytes. Remaining vertebral bodies demonstrate normal stature. IMPRESSION: Minimal kyphosis at T10-T11 with mild loss of the vertebral body heights at these levels. Electronically Signed   By: Donavan Foil M.D.   On: 08/26/2016 20:26    Medications: I have reviewed the patient's current medications.  Assessment/Plan:  1. R CP; R pneumothorax and R hemithorax. Polytrauma. Starting CIR. 2. T10-11 fx - brace; pain control - Ultram 3. Ileus - better. Constipation - will try linzess; cut back on pain meds 4. HTN. Avapro, HCTZ 5. Dyslipidemia. Zocor 6. Thrush - start Diflucan 7. Anemia - monitor CBC  Length of stay, days: 0  Walker Kehr , MD 08/28/2016, 3:37 PM

## 2016-08-28 NOTE — Evaluation (Signed)
Occupational Therapy Assessment and Plan  Patient Details  Name: [name] MRN: [id number] Date of Birth: 10/14/1927  OT Diagnosis: abnormal posture, lumbago (low back pain), muscle weakness (generalized) and decreased endurance Rehab Potential:   ELOS: 10-14 days   Today's Date: 08/28/2016 OT Individual Time: 1257-1350 OT Individual Time Calculation (min): 53 min     Problem List:  Patient Active Problem List   Diagnosis Date Noted  . Abdominal distention   . Benign essential HTN   . Pneumoperitoneum   . Pain   . Acute blood loss anemia   . Tachypnea   . Hemoperitoneum   . Adynamic ileus ([place])   . Nausea in adult   . Acute diastolic heart failure ([place])   . Ileus ([place]) 08/20/2016  . Dysphagia 08/20/2016  . Chest pain 08/20/2016  . Pneumothorax 08/13/2016  . 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 Aortic stenosis 06/20/2014  . Chest tightness 06/20/2014  . Essential hypertension 06/20/2014  . Insomnia 06/20/2014  . Sinus bradycardia 06/20/2014  . Rectal bleeding 01/20/2014  . Aortic valve disorders 11/07/2013  . Rotator cuff syndrome of right shoulder 08/01/2013    Past Medical History:  Past Medical History:  Diagnosis Date  . At risk for sleep apnea    STOP-BANG= 4    SENT TO PCP 04-04-2014  . BPH (benign prostatic hypertrophy)   . Depression   . Diverticulosis of colon   . GERD (gastroesophageal reflux disease)   . Gilbert's syndrome   . Headache    migraines  . Hearing loss   . Heart murmur    "Calcified aortic valve"  . Hepatitis    Hep A as a child  . History of gastroenteritis   . History of kidney stones   . History of syncope    2002--  vasovagal  . HOH (hard of hearing)    refuses to wear his Hearing Aid  . Hypertension   . Inguinal hernia    right  . Internal hemorrhoid, bleeding   . Moderate aortic stenosis    a. Last echo 11/2013, 06/2014.  Marland Kitchen Nocturia   . Normal coronary arteries    a. Cath 07/2010:  normal coronaries. Felt to have noncardiac CP/SOB at that time possibly r/t anxiety.  . RA (rheumatoid arthritis) ([place])    bilateral hands/ wrist--  seronegative  . Rotator cuff syndrome of right shoulder 08/01/2013  . Shortness of breath dyspnea    with exertion  . Stone, kidney   . Thyroid cyst   . Thyroid nodule    noted 11-2009  . Wears dentures    Past Surgical History:  Past Surgical History:  Procedure Laterality Date  . CARDIAC CATHETERIZATION  07-19-2010  dr Tressia Miners turner   normal coronary arteries,  ef 60%,  moderate aortic stenosis- gradient 32mHg, normal right heart pressure  . CARDIOVASCULAR STRESS TEST  05/ 2011   dr sMarlou Porch  normal low risk perfusion study  . CATARACT EXTRACTION W/ INTRAOCULAR LENS  IMPLANT, BILATERAL  2013  . COLONOSCOPY  2010  approx  . THYROID LOBECTOMY Right 05/05/2015   Procedure: RIGHT THYROID LOBECTOMY;  Surgeon: TArmandina Gemma MD;  [facility]  Service: General;  Laterality: Right;  . TRANSANAL HEMORRHOIDAL DEARTERIALIZATION N/A 04/09/2014   Procedure: TRANSANAL HEMORRHOIDAL DEARTERIALIZATION OF INTERNAL HEMORROIDS;  Surgeon: ALeighton Ruff MD;  [facility]  Service: General;  Laterality: N/A;  . TRANSTHORACIC ECHOCARDIOGRAM  11-26-2013   moderate focal  basal and mild LVH/  ef 33-00%/  grade I diastolic dysfunction/ mild LAE/  moderate calcification with stenosis AV with mild regurg,  gradients 35 abd 58 mmHg /  mild TR    Assessment & Plan Clinical Impression: Patient is a 83 y.o. year old male with recent admission to the hospital on 08/14/15 with injuries from fall of 25 feet from tree resulting in low back pain and pain in R torso.  Patient transferred to CIR on 08/28/2016 .    Patient currently requires min with basic self-care skills and IADL secondary to muscle weakness and muscle joint tightness and decreased cardiorespiratoy endurance.  Prior to hospitalization, patient could complete BADL/IADLs with modified  independent .  Patient will benefit from skilled intervention to increase independence with basic self-care skills and increase level of independence with iADL prior to discharge home independently.  Anticipate patient will require intermittent supervision for bathing and follow up home health.  OT - End of Session Activity Tolerance: Tolerates < 10 min activity with changes in vital signs Endurance Deficit: Yes Endurance Deficit Description: 2/2 poor cardiopulmonary endurance OT Assessment Barriers to Discharge: Decreased caregiver support OT Patient demonstrates impairments in the following area(s): Balance;Endurance;Safety;Pain OT Basic ADL's Functional Problem(s): Grooming;Bathing;Dressing;Toileting OT Advanced ADL's Functional Problem(s): Simple Meal Preparation;Laundry;Light Housekeeping;Other (comment) (money management; medication management) OT Transfers Functional Problem(s): Toilet;Tub/Shower OT Plan OT Intensity: Minimum of 1-2 x/day, 45 to 90 minutes OT Frequency: 5 out of 7 days OT Duration/Estimated Length of Stay: 10-14 days OT Treatment/Interventions: Medical illustrator training;Community reintegration;Discharge planning;DME/adaptive equipment instruction;Functional mobility training;Patient/family education;Pain management;Psychosocial support;Self Care/advanced ADL retraining;Therapeutic Activities;Therapeutic Exercise;UE/LE Strength taining/ROM;UE/LE Coordination activities OT Self Feeding Anticipated Outcome(s): MOD I OT Basic Self-Care Anticipated Outcome(s): MOD I OT Toileting Anticipated Outcome(s): MOD I OT Bathroom Transfers Anticipated Outcome(s): MOD I toilet; S for bathing OT Recommendation Patient destination: Home Follow Up Recommendations: Home health OT Equipment Recommended: Tub/shower bench   Skilled Therapeutic Intervention Pt seen supine in bed with daughter present to translate. Pt reports no pain and is agreeable to tx. Pt educated on TLSO orders  and back precautions. Pt able to recall all 3 precautions throughout session. Pt supine>sitting with MIN A and VC for log rolling technique and to push up onto forearm to maintain precautions. Pt completes sit to stand x4 throughout session with MIN A for balance and Vc for posture, safety awareness and weight shifting. Pt demo extension pattern when standing with VC to weight shift foreward. Pt bathes UB/LB with A only for R foot and MIN A for balance while washing peri area/buttocks. Pt able to assume figure 4 in sitting with VC to wash foot. Pt dons pull over shirt with MIN A to pull shirt down trunk. Pt dons underwear/pull up pants by assuming seated figure 4 with LLE and completing hip flexion to thread RLE. With Vc for technique, Pt uses sock aide to don B socks. Pt able to thread toes into slip on shoes, but require A to slide foot into shoe due to worn out heel on shoe. Pt stand pivot transfer EOB>w/c, with VC for RW management, weight shifting forward, and safety awareness. Pt demo shuffling footsteps when pivoting. Exited session with pt seated in w/c with quick release belt donned, daughter present and call light in place.   OT Evaluation Precautions/Restrictions  Precautions Precautions: Fall;Back Precaution Comments: Pt accustomed to being independent so needs chair alarm when up.  Not safe to get up alone Required Braces or Orthoses: Spinal Brace Spinal Brace: Thoracolumbosacral  orthotic;Applied in sitting position Restrictions Weight Bearing Restrictions: No General Chart Reviewed: Yes Pain Pain Assessment Pain Assessment: No/denies pain Pain Score: 4  Pain Location: Back Pain Orientation: Right Pain Onset: On-going Patients Stated Pain Goal: 2 Pain Intervention(s): Medication (See eMAR);Repositioned Home Living/Prior Functioning Home Living Available Help at Discharge: Family Type of Home: House Home Access: Stairs to enter Technical brewer of Steps: 3 Entrance  Stairs-Rails: None Home Layout: One level Bathroom Shower/Tub: Chiropodist: Handicapped height  Lives With: Alone IADL History Homemaking Responsibilities: Yes Meal Prep Responsibility: Primary Laundry Responsibility: Primary Cleaning Responsibility: Primary Bill Paying/Finance Responsibility: Primary Shopping Responsibility: Primary Child Care Responsibility: Primary Current License: Yes Mode of Transportation: Car Occupation: Retired Type of Occupation: Chief Financial Officer Leisure and Hobbies: maintaining home Prior Function Level of Independence: Independent with basic ADLs, Independent with homemaking with ambulation, Independent with transfers, Independent with gait  Able to Take Stairs?: Yes Driving: Yes Vocation: Retired Comments: drives ADL   Vision/Perception  Vision- History Baseline Vision/History: No visual deficits Wears Glasses: Distance only Patient Visual Report: No change from baseline Vision- Assessment Vision Assessment?: No apparent visual deficits  Cognition Overall Cognitive Status: Difficult to assess Arousal/Alertness: Awake/alert Orientation Level: Person;Place;Situation Person: Oriented Place: Oriented Situation: Oriented Year: 2018 Month: March Day of Week: Incorrect Memory: Appears intact Immediate Memory Recall: Sock;Blue;Bed Memory Recall: Sock;Blue;Bed Memory Recall Sock: Without Cue Memory Recall Blue: Without Cue Memory Recall Bed: Without Cue Attention: Selective Selective Attention: Appears intact Awareness: Appears intact Problem Solving: Appears intact Problem Solving Impairment: Functional basic Behaviors: Other (comment);Impulsive (extremely independent and does not want too much help) Safety/Judgment: Impaired Sensation Sensation Light Touch: Appears Intact Stereognosis: Appears Intact Hot/Cold: Appears Intact Proprioception: Appears Intact Coordination Gross Motor Movements are Fluid and Coordinated:  No Fine Motor Movements are Fluid and Coordinated: Yes Coordination and Movement Description: impaired coordination LLE Motor  Motor Motor:  (generalized weakness) Motor - Skilled Clinical Observations:  (generalized weakness) Mobility  Transfers Sit to Stand: 4: Min assist Sit to Stand Details: Verbal cues for technique;Verbal cues for precautions/safety Stand to Sit: 4: Min assist Stand to Sit Details (indicate cue type and reason): Verbal cues for technique;Verbal cues for precautions/safety  Trunk/Postural Assessment  Cervical Assessment Cervical Assessment: Exceptions to [facility] (head foreward) Thoracic Assessment Thoracic Assessment: Exceptions to [facility] (rounded shoulders) Lumbar Assessment Lumbar Assessment: Exceptions to [facility] (posterior pelvic tilt) Postural Control Postural Control: Deficits on evaluation (slow protective responses)  Balance Balance Balance Assessed: Yes Dynamic Standing Balance Dynamic Standing - Balance Support:  (unilateral support) Dynamic Standing - Level of Assistance: 4: Min assist Dynamic Standing - Balance Activities:  (during ADL) Extremity/Trunk Assessment RUE Assessment RUE Assessment: Exceptions to [facility] (generalized weakness; full ROM) LUE Assessment LUE Assessment: Exceptions to [facility] (generalized weakness; full ROM)   See Function Navigator for Current Functional Status.   Refer to Care Plan for Long Term Goals  Recommendations for other services: Therapeutic Recreation  Kitchen group and Outing/community reintegration   Discharge Criteria: Patient will be discharged from OT if patient refuses treatment 3 consecutive times without medical reason, if treatment goals not met, if there is a change in medical status, if patient makes no progress towards goals or if patient is discharged from hospital.  The above assessment, treatment plan, treatment alternatives and goals were discussed and mutually agreed upon: by patient and by  family  Tonny Branch 08/28/2016, 6:07 PM

## 2016-08-28 NOTE — Progress Notes (Signed)
[facility] Progress Note     Subjective: No family at bedside currently. Patient reports mild back soreness and c/o no BM in 3 days. Otherwise stable and ready for therapy. Denies worsening abdominal pain or SOB. States throat pain improved.  Objective: Vital signs in last 24 hours: Temp:  [97.8 F (36.6 C)-98.5 F (36.9 C)] 98.5 F (36.9 C) (03/18 0551) Pulse Rate:  [69-74] 69 (03/18 0551) Resp:  [16-22] 17 (03/18 0551) BP: (123-152)/(54-78) 136/72 (03/18 0551) SpO2:  [96 %-100 %] 97 % (03/18 0551) Weight:  [74.7 kg (164 lb 9.6 oz)-75 kg (165 lb 6.4 oz)] 75 kg (165 lb 6.4 oz) (03/18 0551) Last BM Date: 08/25/16  Intake/Output from previous day: 03/17 0701 - 03/18 0700 In: 310 [P.O.:310] Out: 1050 [Urine:1050] Intake/Output this shift: No intake/output data recorded.  PE: Gen:  Alert, NAD, pleasant Pulm: clear to auscultation bilaterally Abd: soft, non-tender, bowel sounds present in all 4 quadrants.  Lab Results:   Recent Labs  08/26/16 0441 08/27/16 0753  WBC 6.0 6.2  HGB 9.8* 10.6*  HCT 30.3* 33.0*  PLT 229 262   BMET  Recent Labs  08/26/16 0441 08/27/16 0753  NA 137 135  K 3.9 3.9  CL 100* 98*  CO2 29 26  GLUCOSE 95 85  BUN 16 12  CREATININE 1.06 0.92  CALCIUM 8.3* 8.3*   PT/INR  Recent Labs  08/26/16 0805  LABPROT 14.9  INR 1.17   CMP     Component Value Date/Time   NA 135 08/27/2016 0753   K 3.9 08/27/2016 0753   CL 98 (L) 08/27/2016 0753   CO2 26 08/27/2016 0753   GLUCOSE 85 08/27/2016 0753   BUN 12 08/27/2016 0753   CREATININE 0.92 08/27/2016 0753   CALCIUM 8.3 (L) 08/27/2016 0753   PROT 5.3 (L) 08/21/2016 0313   ALBUMIN 2.7 (L) 08/21/2016 0313   AST 39 08/21/2016 0313   ALT 34 08/21/2016 0313   ALKPHOS 50 08/21/2016 0313   BILITOT 1.4 (H) 08/21/2016 0313   GFRNONAA >60 08/27/2016 0753   GFRAA >60 08/27/2016 0753   Lipase     Component Value Date/Time   LIPASE 14 08/20/2016 1026        Studies/Results: Dg Thoracic Spine 2 View  Result Date: 08/26/2016 CLINICAL DATA:  T10 vertebral fracture EXAM: THORACIC SPINE 2 VIEWS COMPARISON:  08/24/2016, 08/13/2016 FINDINGS: Mild loss of height of T10 and T11 vertebral bodies, stable compared with MRI it T11, possibly slightly progressed at T10. Mild kyphosis at T10-T11. Posterior extent of the fractures at T10 and 11 is better seen on the cross-sectional images. Multilevel degenerative osteophytes. Remaining vertebral bodies demonstrate normal stature. IMPRESSION: Minimal kyphosis at T10-T11 with mild loss of the vertebral body heights at these levels. Electronically Signed   By: Donavan Foil M.D.   On: 08/26/2016 20:26    Anti-infectives: Anti-infectives    None       Assessment/Plan 20-25 foot fall from tree Right PTX with subcutaneous emphysema Right sided costochondral fractures (4-6) sternum is intact Small anterior mediastinal hematoma R pleural effusion- S/P thoracentesis 3/14 Hemoperitoneum- hemodynamically stable and benign abdominal exam T 10 FX - TLSObrace at bedside. PT to ambulate Right ingunial hernia containing loops of small bowel and colonic diverticulosis- now reduced as seen on CT VTE- resume Lovenox FEN- heart healthy diet  Disposition/Plan- discharge to CIR today Swish and Swallow meds for thrush  LOS: 15 days    Jill Alexanders , [facility] 08/28/2016, 9:42 AM Pager: [phone] Consults: [phone] Mon-Fri 7:00 am-4:30 pm Sat-Sun 7:00 am-11:30 am

## 2016-08-28 NOTE — Plan of Care (Signed)
Problem: RH Balance Goal: LTG Patient will maintain dynamic standing balance (PT) LTG:  Patient will maintain dynamic standing balance with assistance during mobility activities (PT) With LRAD  Problem: RH Bed to Chair Transfers Goal: LTG Patient will perform bed/chair transfers w/assist (PT) LTG: Patient will perform bed/chair transfers with assistance, with/without cues (PT). With LRAD  Problem: RH Car Transfers Goal: LTG Patient will perform car transfers with assist (PT) LTG: Patient will perform car transfers with assistance (PT). With LRAD  Problem: RH Furniture Transfers Goal: LTG Patient will perform furniture transfers w/assist (OT/PT LTG: Patient will perform furniture transfers  with assistance (OT/PT). With LRAD  Problem: RH Ambulation Goal: LTG Patient will ambulate in controlled environment (PT) LTG: Patient will ambulate in a controlled environment, # of feet with assistance (PT). 150 ft with LRAD Goal: LTG Patient will ambulate in home environment (PT) LTG: Patient will ambulate in home environment, # of feet with assistance (PT). 31 ft with LRAD  Problem: RH Stairs Goal: LTG Patient will ambulate up and down stairs w/assist (PT) LTG: Patient will ambulate up and down # of stairs with assistance (PT) 3 steps without rails for home access

## 2016-08-28 NOTE — Progress Notes (Signed)
Patient ID: [name], male   DOB: 1927/10/01, 83 y.o.   MRN: [id number] Late entry note: Patient seen around yesterday morning neurologically nonfocal no lower 70 symptoms back pain manageable  May mobilize in the brace upright x-rays showed mild kyphosis.  Plan per Dr. Vertell Limber and Dr. Ellene Route in the morning.

## 2016-08-29 ENCOUNTER — Inpatient Hospital Stay (HOSPITAL_COMMUNITY): Payer: Medicare Other | Admitting: Physical Therapy

## 2016-08-29 ENCOUNTER — Inpatient Hospital Stay (HOSPITAL_COMMUNITY): Payer: Medicare Other

## 2016-08-29 ENCOUNTER — Inpatient Hospital Stay (HOSPITAL_COMMUNITY): Payer: Medicare Other | Admitting: Occupational Therapy

## 2016-08-29 DIAGNOSIS — K668 Other specified disorders of peritoneum: Secondary | ICD-10-CM

## 2016-08-29 DIAGNOSIS — S22039S Unspecified fracture of third thoracic vertebra, sequela: Secondary | ICD-10-CM

## 2016-08-29 DIAGNOSIS — S22079A Unspecified fracture of T9-T10 vertebra, initial encounter for closed fracture: Secondary | ICD-10-CM

## 2016-08-29 LAB — CBC WITH DIFFERENTIAL/PLATELET
BASOS ABS: 0 10*3/uL (ref 0.0–0.1)
Basophils Relative: 0 %
EOS PCT: 10 %
Eosinophils Absolute: 0.6 10*3/uL (ref 0.0–0.7)
HCT: 34.9 % — ABNORMAL LOW (ref 39.0–52.0)
Hemoglobin: 11.2 g/dL — ABNORMAL LOW (ref 13.0–17.0)
LYMPHS PCT: 16 %
Lymphs Abs: 1 10*3/uL (ref 0.7–4.0)
MCH: 28.5 pg (ref 26.0–34.0)
MCHC: 32.1 g/dL (ref 30.0–36.0)
MCV: 88.8 fL (ref 78.0–100.0)
MONO ABS: 0.9 10*3/uL (ref 0.1–1.0)
Monocytes Relative: 14 %
Neutro Abs: 3.7 10*3/uL (ref 1.7–7.7)
Neutrophils Relative %: 60 %
PLATELETS: 267 10*3/uL (ref 150–400)
RBC: 3.93 MIL/uL — ABNORMAL LOW (ref 4.22–5.81)
RDW: 13.6 % (ref 11.5–15.5)
WBC: 6.3 10*3/uL (ref 4.0–10.5)

## 2016-08-29 LAB — COMPREHENSIVE METABOLIC PANEL
ALT: 25 U/L (ref 17–63)
ANION GAP: 8 (ref 5–15)
AST: 28 U/L (ref 15–41)
Albumin: 3.1 g/dL — ABNORMAL LOW (ref 3.5–5.0)
Alkaline Phosphatase: 150 U/L — ABNORMAL HIGH (ref 38–126)
BUN: 17 mg/dL (ref 6–20)
CHLORIDE: 98 mmol/L — AB (ref 101–111)
CO2: 29 mmol/L (ref 22–32)
Calcium: 8.4 mg/dL — ABNORMAL LOW (ref 8.9–10.3)
Creatinine, Ser: 1.19 mg/dL (ref 0.61–1.24)
GFR calc non Af Amer: 53 mL/min — ABNORMAL LOW (ref >60)
Glucose, Bld: 104 mg/dL — ABNORMAL HIGH (ref 65–99)
POTASSIUM: 4.7 mmol/L (ref 3.5–5.1)
Sodium: 135 mmol/L (ref 135–145)
Total Bilirubin: 2.2 mg/dL — ABNORMAL HIGH (ref 0.3–1.2)
Total Protein: 6.5 g/dL (ref 6.5–8.1)

## 2016-08-29 LAB — CULTURE, BODY FLUID W GRAM STAIN -BOTTLE

## 2016-08-29 LAB — CULTURE, BODY FLUID-BOTTLE: CULTURE: NO GROWTH

## 2016-08-29 MED ORDER — MAGIC MOUTHWASH W/LIDOCAINE
5.0000 mL | Freq: Four times a day (QID) | ORAL | Status: DC
  Administered 2016-08-29 – 2016-09-05 (×23): 5 mL via ORAL
  Filled 2016-08-29 (×25): qty 5

## 2016-08-29 MED ORDER — MAGIC MOUTHWASH
10.0000 mL | Freq: Three times a day (TID) | ORAL | Status: DC | PRN
Start: 2016-08-29 — End: 2016-09-06

## 2016-08-29 NOTE — Progress Notes (Signed)
Physical Therapy Note  Patient Details  Name: [name] MRN: [id number] Date of Birth: 1927/07/09 Today's Date: 08/29/2016    Time: 1030-1112 42 minutes  1:1 Pt c/o pain in throat, RN made aware.  Supine to sit with log roll with supervision, cues for technique.  Sit to stands throughout session with supervision, cues for UE placement.  Gait with RW and min guard 200', 100' x 2 with decreased step and stride length.  Standing therex with supervision for balance with UE support, pt requires rest breaks due to fatigue.  w/c mobility with bilat UEs for strength and endurance x 100' with min A for turns, supervision for straight path.  Pt requests to rest in bed at end of session due to fatigue.  Supervision with bed mobility back to bed.   Velvia Mehrer 08/29/2016, 11:13 AM

## 2016-08-29 NOTE — Progress Notes (Signed)
Occupational Therapy Session Note  Patient Details  Name: [name] MRN: [id number] Date of Birth: [birth year]  Today's Date: 08/29/2016 OT Individual Time: 1300-1335 OT Individual Time Calculation (min): 35 min    Short Term Goals: Week 1:  OT Short Term Goal 1 (Week 1): pt will sit to stand with supervision in prep for clothing management OT Short Term Goal 2 (Week 1): Pt will groom in standing for 2 grooming items to improve functional endurance OT Short Term Goal 3 (Week 1): Pt will complete tub transfer with supervision and LRAD OT Short Term Goal 4 (Week 1): Pt will complete LB dressing with supervision and AE prn  Skilled Therapeutic Interventions/Progress Updates:    Pt seen for OT treatment.  Already laying on his right side at start of session.  Had him transition to sitting EOB with min assist.  Once sitting pt unable to state any back precautions so therapist demonstrated them to him.  He was not able to donn TLSO, thus requiring total assist from therapist.  Pt unable to understand/state if he lived alone or not as he will likely need assist with donning and doffing the brace at this time.  He was able to donn slip on shoes with min assist, but did not have LH shoe horn this visit.  Completed toilet transfers with min assist using the RW as well as functional mobility to and from the ADL apartment with the RW and close supervision.  Min instructional cueing for hand placement during transfers and for reaching back to the chair when attempting to sit.  Finished session with pt left in the wheelchair with safety belt in place and call button in reach.    Therapy Documentation Precautions:  Precautions Precautions: Fall, Back Precaution Comments: Pt accustomed to being independent so needs chair alarm when up.  Not safe to get up alone Required Braces or Orthoses: Spinal Brace Spinal Brace: Thoracolumbosacral orthotic, Applied in sitting position Restrictions Weight Bearing  Restrictions: No  Pain: Pain Assessment Pain Assessment: Faces Faces Pain Scale: Hurts a little bit Pain Type: Acute pain Pain Location: Back Pain Descriptors / Indicators: Discomfort Pain Onset: Unable to tell Pain Intervention(s): Repositioned ADL:  See Function Navigator for Current Functional Status.   Therapy/Group: Individual Therapy  Tigran Haynie OTR/L 08/29/2016, 4:01 PM

## 2016-08-29 NOTE — Progress Notes (Signed)
Patient information reviewed and entered into eRehab system by Joyia Riehle, RN, CRRN, PPS Coordinator.  Information including medical coding and functional independence measure will be reviewed and updated through discharge.     Per nursing patient was given "Data Collection Information Summary for Patients in Inpatient Rehabilitation Facilities with attached "Privacy Act Statement-Health Care Records" upon admission.  

## 2016-08-29 NOTE — Progress Notes (Signed)
Ankit Lorie Phenix, MD Physician Signed Physical Medicine and Rehabilitation  Consult Note Date of Service: 08/23/2016 6:14 AM  Related encounter: ED to Hosp-Admission (Discharged) from 08/13/2016 in [facility]     Expand All Collapse All   [] Hide copied text [] Hover for attribution information      Physical Medicine and Rehabilitation Consult Reason for Consult: Polytrauma after fall with multiple right rib fractures and right pneumothorax/right anterior mediastinal hematoma Referring Physician: Trauma services   HPI: [name] is a 83 y.o. right handed [place] speaking male with history of hypertension. Per chart review patient lives alone. His daughter lives in the home next to him. Patient reported to be independent prior to admission. Question of 24 hour assistance on discharge. Presented 08/13/2016 after a fall  from a tree while cutting branches. No report of loss of consciousness. Complaints of back pain. Cranial CT reviewed, unremarkable for acute process. CT cervical spine negative. CT of the chest abdomen and pelvis showed a right pneumothorax with right chest wall subcutaneous emphysema. Right costochondral fractures. Small anterior mediastinal hematoma. No evidence for great vessel injury or acute intra-abdominal injury. Conservative care of right pneumothorax. Decrease in hemoglobin with follow-up CT abdomen and pelvis showed pneumoperitoneum of which patient was transfused close monitoring of CBC with latest hemoglobin 8.5. Patient developed nausea abdominal bloating as well as reports of chronic dysphagia with gastroenterology consulted 08/20/2016. A modified barium swallow was completed and advised regular diet. A plain abdominal film suggested mild large and small bowel ileus and no signs of peritonitis. A nasogastric tube was placed for decompression and presently on a clear liquid diet.  Evaluated with Surg, who plans to advance diet today.  Hospital course pain management. Physical occupational therapy evaluations completed. Recommendations for physical medicine rehabilitation consult.   Review of Systems  Unable to perform ROS: Language       Past Medical History:  Diagnosis Date  . At risk for sleep apnea    STOP-BANG= 4    SENT TO PCP 04-04-2014  . BPH (benign prostatic hypertrophy)   . Depression   . Diverticulosis of colon   . GERD (gastroesophageal reflux disease)   . Gilbert's syndrome   . Headache    migraines  . Hearing loss   . Heart murmur    "Calcified aortic valve"  . Hepatitis    Hep A as a child  . History of gastroenteritis   . History of kidney stones   . History of syncope    2002--  vasovagal  . HOH (hard of hearing)    refuses to wear his Hearing Aid  . Hypertension   . Inguinal hernia    right  . Internal hemorrhoid, bleeding   . Moderate aortic stenosis    a. Last echo 11/2013, 06/2014.  Marland Kitchen Nocturia   . Normal coronary arteries    a. Cath 07/2010: normal coronaries. Felt to have noncardiac CP/SOB at that time possibly r/t anxiety.  . RA (rheumatoid arthritis) ([employer])    bilateral hands/ wrist--  seronegative  . Rotator cuff syndrome of right shoulder 08/01/2013  . Shortness of breath dyspnea    with exertion  . Stone, kidney   . Thyroid cyst   . Thyroid nodule    noted 11-2009  . Wears dentures         Past Surgical History:  Procedure Laterality Date  . CARDIAC CATHETERIZATION  07-19-2010  dr Tressia Miners turner   normal coronary arteries,  ef 60%,  moderate aortic stenosis- gradient 97mmHg, normal right heart pressure  . CARDIOVASCULAR STRESS TEST  05/ 2011   dr Marlou Porch   normal low risk perfusion study  . CATARACT EXTRACTION W/ INTRAOCULAR LENS  IMPLANT, BILATERAL  2013  . COLONOSCOPY  2010  approx  . THYROID LOBECTOMY Right 05/05/2015   Procedure: RIGHT THYROID LOBECTOMY;  Surgeon: Armandina Gemma, MD;  Location: [facility];  Service: General;   Laterality: Right;  . TRANSANAL HEMORRHOIDAL DEARTERIALIZATION N/A 04/09/2014   Procedure: TRANSANAL HEMORRHOIDAL DEARTERIALIZATION OF INTERNAL HEMORROIDS;  Surgeon: Leighton Ruff, MD;  Location: [facility];  Service: General;  Laterality: N/A;  . TRANSTHORACIC ECHOCARDIOGRAM  11-26-2013   moderate focal basal and mild LVH/  ef 31-49%/  grade I diastolic dysfunction/ mild LAE/  moderate calcification with stenosis AV with mild regurg,  gradients 35 abd 58 mmHg /  mild TR         Family History  Problem Relation Age of Onset  . Pulmonary embolism Mother 66    caused by complications of surgery  . CVA Father   . Diabetes Brother   . Diabetes Brother   . Breast cancer Sister    Social History:  reports that he quit smoking about 35 years ago. His smoking use included Cigarettes. He started smoking about 43 years ago. He quit after 40.00 years of use. He has never used smokeless tobacco. He reports that he drinks alcohol. He reports that he does not use drugs. Allergies: No Active Allergies       Medications Prior to Admission  Medication Sig Dispense Refill  . aspirin EC 81 MG tablet Take 81 mg by mouth daily.    Marland Kitchen DIOVAN 80 MG tablet Take 80 mg by mouth every morning.     . escitalopram (LEXAPRO) 20 MG tablet Take 20 mg by mouth daily.   4  . folic acid (FOLVITE) 1 MG tablet Take 1 mg by mouth daily.   0  . hydrochlorothiazide (MICROZIDE) 12.5 MG capsule Take 12.5 mg by mouth daily.    Marland Kitchen HYDROcodone-acetaminophen (NORCO/VICODIN) 5-325 MG tablet Take 1-2 tablets by mouth every 4 (four) hours as needed for moderate pain. 20 tablet 0  . ibuprofen (ADVIL,MOTRIN) 800 MG tablet Take 800 mg by mouth every 8 (eight) hours as needed for moderate pain.     . methotrexate (RHEUMATREX) 2.5 MG tablet Take 22.5 mg by mouth once a week. TAKE ON SUNDAYS    . omeprazole (PRILOSEC) 20 MG capsule TAKE 1 CAPSULE(20 MG) BY MOUTH DAILY 15 capsule 0  . simvastatin (ZOCOR)  20 MG tablet TAKE 1 TABLET BY MOUTH EVERY NIGHT AT BEDTIME 30 tablet 0  . dutasteride (AVODART) 0.5 MG capsule Take 0.5 mg by mouth daily.  0    Home: Home Living Family/patient expects to be discharged to:: Private residence Living Arrangements: Children (daughter who works most of time in [place]) Available Help at Discharge: Family, Available 24 hours/day (until 3/17 and then intermittently) Type of Home: House Home Access: Stairs to enter [employer] of Steps: 2 Home Layout: One level Bathroom Shower/Tub: Chiropodist: Handicapped height Home Equipment: Grab bars - tub/shower  Functional History: Prior Function Level of Independence: Independent Comments: drives Functional Status:  Mobility: Bed Mobility Overal bed mobility: Needs Assistance Bed Mobility: Supine to Sit Supine to sit: Min assist Sit to supine: Min assist General bed mobility comments: Assist to elevate trunk into sitting and bring hips to EOB Transfers Overall  transfer level: Needs assistance Equipment used: Rolling walker (2 wheeled) Transfers: Sit to/from Stand Sit to Stand: Min guard Stand pivot transfers: Min guard General transfer comment: Assist for balance and safety Ambulation/Gait Ambulation/Gait assistance: Min guard Ambulation Distance (Feet): 170 Feet Assistive device: Rolling walker (2 wheeled) Gait Pattern/deviations: Step-through pattern, Decreased stride length, Drifts right/left General Gait Details: Assist for balance and safety. Unable to get SpO2 reading during or after amb. SpO2 prior to amb 98% on RA. Amb on RA.  Gait velocity: decr Gait velocity interpretation: Below normal speed for age/gender  ADL: ADL Overall ADL's : Needs assistance/impaired Eating/Feeding: Independent, Sitting Grooming: Wash/dry hands, Wash/dry face, Oral care, Supervision/safety, Standing Grooming Details (indicate cue type and reason): Pt stood at sink for appx 6  minutes with S. Upper Body Bathing: Set up, Sitting Lower Body Bathing: Moderate assistance, Sit to/from stand Lower Body Bathing Details (indicate cue type and reason): followed up on use of long sponge Upper Body Dressing : Minimal assistance, Sitting Upper Body Dressing Details (indicate cue type and reason): front opening gown Lower Body Dressing: Sit to/from stand, Minimal assistance Lower Body Dressing Details (indicate cue type and reason): practiced use of sock aide and demonstrated use of reacher for pants, removing sock Toilet Transfer: Min guard, RW, Cueing for safety Toilet Transfer Details (indicate cue type and reason): pt walked to bathroom. cues to reach back when sitting and to keep walker close by. Toileting- Water quality scientist and Hygiene: Min guard, Sit to/from stand, Cueing for safety Toileting - Clothing Manipulation Details (indicate cue type and reason): cues to not get up alone. Do not leave in bathroom alone; pt will get up. Functional mobility during ADLs: Minimal assistance, Rolling walker, Cueing for sequencing, Cueing for safety General ADL Comments: Educated family in safe techniques and need for someone to be with pt at all times at this point.  Cognition: Cognition Overall Cognitive Status: Within Functional Limits for tasks assessed Orientation Level: Oriented X4 Cognition Arousal/Alertness: Awake/alert Behavior During Therapy: WFL for tasks assessed/performed Overall Cognitive Status: Within Functional Limits for tasks assessed  Blood pressure 133/62, pulse (!) 59, temperature 98.2 F (36.8 C), temperature source Oral, resp. rate (!) 22, height 5\' 9"  (1.753 m), weight 87.6 kg (193 lb 2 oz), SpO2 96 %. Physical Exam  Vitals reviewed. Constitutional: He appears well-developed.  Obese  HENT:  Head: Normocephalic.  Right Ear: External ear normal.  Left Ear: External ear normal.  Eyes: Conjunctivae and EOM are normal.  Neck: Normal range of  motion. Neck supple. No thyromegaly present.  Cardiovascular: Normal rate and regular rhythm.   Respiratory: He has wheezes.  Increased WOB  GI:  Abdomen mildly distended. Nontender with diminished bowel sounds  Musculoskeletal: He exhibits no edema or tenderness.  Neurological: He is alert.  Exam limited by language barrier.  He is able to follow basic commands Motor: B/l UE, RLE 5/5 proximal to distal LLE: HF 4+/5, distally 5/5 Sensation appears to be intact to light touch  Skin: Skin is warm and dry.  Psychiatric:  Mood and behavior appear to be normal    Lab Results Last 24 Hours       Results for orders placed or performed during the hospital encounter of 08/13/16 (from the past 24 hour(s))  CBC     Status: Abnormal   Collection Time: 08/22/16  1:32 PM  Result Value Ref Range   WBC 4.4 4.0 - 10.5 K/uL   RBC 3.50 (L) 4.22 - 5.81 MIL/uL  Hemoglobin 9.9 (L) 13.0 - 17.0 g/dL   HCT 31.3 (L) 39.0 - 52.0 %   MCV 89.4 78.0 - 100.0 fL   MCH 28.3 26.0 - 34.0 pg   MCHC 31.6 30.0 - 36.0 g/dL   RDW 15.8 (H) 11.5 - 15.5 %   Platelets 191 150 - 400 K/uL  CBC     Status: Abnormal   Collection Time: 08/23/16  3:34 AM  Result Value Ref Range   WBC 4.2 4.0 - 10.5 K/uL   RBC 2.97 (L) 4.22 - 5.81 MIL/uL   Hemoglobin 8.5 (L) 13.0 - 17.0 g/dL   HCT 26.6 (L) 39.0 - 52.0 %   MCV 89.6 78.0 - 100.0 fL   MCH 28.6 26.0 - 34.0 pg   MCHC 32.0 30.0 - 36.0 g/dL   RDW 15.6 (H) 11.5 - 15.5 %   Platelets 192 150 - 400 K/uL  Basic metabolic panel     Status: Abnormal   Collection Time: 08/23/16  3:34 AM  Result Value Ref Range   Sodium 139 135 - 145 mmol/L   Potassium 4.3 3.5 - 5.1 mmol/L   Chloride 112 (H) 101 - 111 mmol/L   CO2 22 22 - 32 mmol/L   Glucose, Bld 93 65 - 99 mg/dL   BUN 13 6 - 20 mg/dL   Creatinine, Ser 0.97 0.61 - 1.24 mg/dL   Calcium 7.7 (L) 8.9 - 10.3 mg/dL   GFR calc non Af Amer >60 >60 mL/min   GFR calc Af Amer >60 >60 mL/min   Anion  gap 5 5 - 15      Imaging Results (Last 48 hours)  Ct Abdomen Pelvis Wo Contrast  Result Date: 08/21/2016 CLINICAL DATA:  RECENT TRAUMA. FELL FROM TREE. RIGHT-SIDED RIB FRACTURES AND PNEUMOTHORAX. EXAM: CT ABDOMEN AND PELVIS WITHOUT CONTRAST TECHNIQUE: Multidetector CT imaging of the abdomen and pelvis was performed following the standard protocol without IV contrast. COMPARISON:  CT of the abdomen and pelvis 08/13/2016. Abdominal radiographs 08/21/2016. FINDINGS: Lower chest: Bilateral pleural effusions are present, right greater than left. Pneumomediastinum is again seen. Dense subcutaneous emphysema coronary artery calcifications are evident. Subcutaneous emphysema is worse right than left. Hepatobiliary: Punctate calcifications are again noted in the liver. No focal liver lesion or trauma is evident. The common bile duct and gallbladder are within normal limits. Pancreas: Unremarkable. No pancreatic ductal dilatation or surrounding inflammatory changes. Spleen: Normal in size without focal abnormality. Adrenals/Urinary Tract: The adrenal glands are normal bilaterally. Bilateral renal cysts are again noted, right greater than left. Ureters are within normal limits. The urinary bladder is unremarkable. Stomach/Bowel: Stomach and duodenum are within normal limits. Small bowel is unremarkable. Bowel is no longer herniated into the right inguinal canal. The appendix is visualized and normal. The ascending and transverse colon are normal. The descending and sigmoid colon are within normal limits. Vascular/Lymphatic: Atherosclerotic calcifications are again seen without aneurysm. There is no significant adenopathy. Reproductive: Prostate is unremarkable. Other: High-density fluid layering within the abdomen and pelvis compatible with pneumoperitoneum. No discrete source is identified. Blood extends into the right inguinal canal along with fat. Musculoskeletal: Multilevel degenerative changes are again noted in  the lower lumbar spine. Anterior costochondral fractures are again seen. No new fractures are evident. IMPRESSION: 1. Pneumoperitoneum likely accounts for the drop in hemoglobin. 2. No discrete etiology of the pneumoperitoneum is evident. 3. Pneumomediastinum. 4. Extensive subcutaneous emphysema is most prominent at the right chest and extends into the right abdomen. 5. Large right inguinal  hernia contains fat and fluid, likely blood. Bowel is no longer present within hernia. 6. Atherosclerosis. 7. Degenerative changes of the lower lumbar spine. 8. Anterior costochondral fractures on the right. These results were called by telephone at the time of interpretation on 08/21/2016 at 8:44 pm to Dr. Stark Klein, who verbally acknowledged these results. Electronically Signed   By: San Morelle M.D.   On: 08/21/2016 20:53   Dg Abd 2 Views  Result Date: 08/21/2016 CLINICAL DATA:  Ileus EXAM: ABDOMEN - 2 VIEW COMPARISON:  08/21/2016 FINDINGS: Contrast material remains throughout the colon similar to that seen on the prior exam from a prior CT study. The nasogastric catheter has been removed in the interval. Right-sided pleural effusion is again identified. No obstructive changes are seen. No free air is noted. Degenerative changes of the lumbar spine are seen. IMPRESSION: No acute abnormality is noted aside from a right pleural effusion. Stable contrast throughout the colon. Electronically Signed   By: Inez Catalina M.D.   On: 08/21/2016 08:46     Assessment/Plan: Diagnosis: Polytrauma  Labs and images independently reviewed.  Records reviewed and summated above.  1. Does the need for close, 24 hr/day medical supervision in concert with the patient's rehab needs make it unreasonable for this patient to be served in a less intensive setting? Potentially 2. Co-Morbidities requiring supervision/potential complications: HTN (monitor and provide prns in accordance with increased physical exertion and pain),  right pneumothorax with right chest wall subcutaneous emphysema, pneumohemoperitoneum (monitor), dysphagia (cont to monitor), pain management (Biofeedback training with therapies to help reduce reliance on opiate pain medications and other IV meds, particularly IV toradol, monitor pain control during therapies, and sedation at rest and titrate to maximum efficacy to ensure participation and gains in therapies), tachypnea (monitor RR and O2 Sats with increased physical exertion), ABLA (transfuse if necessary to ensure appropriate perfusion for increased activity tolerance) 3. Due to bowel management, safety, disease management, pain management and patient education, does the patient require 24 hr/day rehab nursing? Yes 4. Does the patient require coordinated care of a physician, rehab nurse, PT (1-2 hrs/day, 5 days/week) and OT (1-2 hrs/day, 5 days/week) to address physical and functional deficits in the context of the above medical diagnosis(es)? Yes Addressing deficits in the following areas: balance, endurance, locomotion, transferring, toileting and psychosocial support 5. Can the patient actively participate in an intensive therapy program of at least 3 hrs of therapy per day at least 5 days per week? Potentially 6. The potential for patient to make measurable gains while on inpatient rehab is good 7. Anticipated functional outcomes upon discharge from inpatient rehab are modified independent and supervision  with PT, modified independent and supervision with OT, n/a with SLP. 8. Estimated rehab length of stay to reach the above functional goals is: 12-16 days. 9. Does the patient have adequate social supports and living environment to accommodate these discharge functional goals? Potentially 10. Anticipated D/C setting: Home 11. Anticipated post D/C treatments: HH therapy and Home excercise program 12. Overall Rehab/Functional Prognosis: excellent  RECOMMENDATIONS: This patient's condition is  appropriate for continued rehabilitative care in the following setting: Currently pt unable to tolerate 3 hours therapy/day with SOB at rest.  Further, vased on therapies, it appears pt will need some level of assistance/supervision at discharge.  If this is available recommend CIR if/when patient able to tolerate. Patient has agreed to participate in recommended program. Potentially Note that insurance prior authorization may be required for reimbursement for recommended care.  Comment: Rehab Admissions Coordinator to follow up.  Delice Lesch, MD, Mellody Drown Cathlyn Parsons., PA-C 08/23/2016    Revision History                        Routing History

## 2016-08-29 NOTE — Progress Notes (Signed)
Occupational Therapy Note  Patient Details  Name: [name] MRN: [id number] Date of Birth: March 20, 1928  Today's Date: 08/29/2016 OT Individual Time: 1400-1430 OT Individual Time Calculation (min): 30 min   Pt denied pain Individual therapy  Pt resting in w/c upon arrival.  Pt stated he was tired but willing to participate in therapy.  Pt propelled w/c to ADL apartment and engaged in functional amb with RW for simple home mgmt tasks.  Attempted to determine shower arrangement at home but unable to accurately assess 2/2 pt's limited English.  Translator scheduled for next day's therapy.  Pt propelled w/c back to room and remained in w/c with QRB in place and family present.    [name] [facility] 08/29/2016, 2:52 PM

## 2016-08-29 NOTE — Progress Notes (Signed)
Occupational Therapy Session Note  Patient Details  Name: [name] MRN: [id number] Date of Birth: [birth year]  Today's Date: 08/29/2016 OT Individual Time: 3220-2542 OT Individual Time Calculation (min): 60 min  and Today's Date: 08/29/2016 OT Missed Time: 15 Minutes Missed Time Reason: Patient fatigue   Short Term Goals: Week 1:  OT Short Term Goal 1 (Week 1): pt will sit to stand with supervision in prep for clothing management OT Short Term Goal 2 (Week 1): Pt will groom in standing for 2 grooming items to improve functional endurance OT Short Term Goal 3 (Week 1): Pt will complete tub transfer with supervision and LRAD OT Short Term Goal 4 (Week 1): Pt will complete LB dressing with supervision and AE prn  Skilled Therapeutic Interventions/Progress Updates:    Upon entering the room, pt supine in bed with no c/o,signs, or symptoms of pain this session. Pt with limited english but interpreter scheduled for tomorrow's therapies. Pt performed supine >sit with mod A for log roll technique. Pt needing total A to don spinal brace while seated on EOB. Pt ambulating with min hand held assist 5' to sink for bathing and dressing task. Pt shaving face while seated with set up A to obtain items. Pt bathing LB while standing with min A for standing balance with task. Pt needing multiple rest breaks this session secondary to fatigue with task. Pt asking to return to bed secondary to fatigue and missing 15 minutes of OT intervention. Pt needing mod A for B LE's for sit >supine in order to maintain back precautions. Bed alarm activated. Call bell within reach upon exiting the room.   Therapy Documentation Precautions:  Precautions Precautions: Fall, Back Precaution Comments: Pt accustomed to being independent so needs chair alarm when up.  Not safe to get up alone Required Braces or Orthoses: Spinal Brace Spinal Brace: Thoracolumbosacral orthotic, Applied in sitting position Restrictions Weight  Bearing Restrictions: No General: General OT Amount of Missed Time: 15 Minutes  See Function Navigator for Current Functional Status.   Therapy/Group: Individual Therapy  Gypsy Decant 08/29/2016, 12:45 PM

## 2016-08-29 NOTE — H&P (Addendum)
Physical Medicine and Rehabilitation Admission H&P       Chief Complaint  Patient presents with  . Trauma  : HPI: [name] a 83 y.o.right handed [place] speaking malewith history of hypertension. Per chart review patient lives alone. His daughter lives in the home next to him. Patient reported to be independent prior to admission. Question of 24 hour assistance on discharge. Presented 08/13/2016 after a fall from a tree while cutting branches. No report of loss of consciousness. Complaints of back pain. Cranial CT reviewed, unremarkable for acute process. CT cervical spine negative. CT of the chest abdomen and pelvis showed a right pneumothorax with right chest wall subcutaneous emphysema. Right costochondral fractures. Small anterior mediastinal hematoma. No evidence for great vessel injury or acute intra-abdominal injury. Conservative care of right pneumothorax.MRI thoracic spine for increasing back pain 08/24/2016 showed acute appearing T10 fracture without height loss. Mild T11 acute compression fracture and edema extending to the posterior element. No neurocompression. Neurosurgery Dr. Cyndy Freeze consulted advise conservative care with TLSO back brace applied. Patient underwent a right thoracentesis for right pleural effusion 08/24/2016 with a 900 mL yield of bloody fluid removed. Decrease in hemoglobin with follow-up CT abdomen and pelvis showed pneumoperitoneum of which patient was transfused close monitoring of CBC with latest hemoglobin 8.5. Patient developed nausea abdominal bloating as well as reports of chronic dysphagia with gastroenterology consulted 08/20/2016. A modified barium swallow was completedand advised regular diet. A plain abdominal film suggested mild large and small bowel ileus and no signs of peritonitis. A nasogastric tube was placed for decompressionand Diet slowly advanced.  Hospital course pain management.Patient did have bouts of agitation and restlessness  and was provided with a sitter for safety. Physical occupational therapy evaluations completed. Recommendations for physical medicine rehabilitation consult.Patient was admitted for a comprehensive rehabilitation program  Review of Systems  Unable to perform ROS: Language       Past Medical History:  Diagnosis Date  . At risk for sleep apnea    STOP-BANG= 4    SENT TO PCP 04-04-2014  . BPH (benign prostatic hypertrophy)   . Depression   . Diverticulosis of colon   . GERD (gastroesophageal reflux disease)   . Gilbert's syndrome   . Headache    migraines  . Hearing loss   . Heart murmur    "Calcified aortic valve"  . Hepatitis    Hep A as a child  . History of gastroenteritis   . History of kidney stones   . History of syncope    2002--  vasovagal  . HOH (hard of hearing)    refuses to wear his Hearing Aid  . Hypertension   . Inguinal hernia    right  . Internal hemorrhoid, bleeding   . Moderate aortic stenosis    a. Last echo 11/2013, 06/2014.  Marland Kitchen Nocturia   . Normal coronary arteries    a. Cath 07/2010: normal coronaries. Felt to have noncardiac CP/SOB at that time possibly r/t anxiety.  . RA (rheumatoid arthritis) ([place])    bilateral hands/ wrist--  seronegative  . Rotator cuff syndrome of right shoulder 08/01/2013  . Shortness of breath dyspnea    with exertion  . Stone, kidney   . Thyroid cyst   . Thyroid nodule    noted 11-2009  . Wears dentures         Past Surgical History:  Procedure Laterality Date  . CARDIAC CATHETERIZATION  07-19-2010  dr Tressia Miners turner   normal coronary  arteries,  ef 60%,  moderate aortic stenosis- gradient 21mHg, normal right heart pressure  . CARDIOVASCULAR STRESS TEST  05/ 2011   dr sMarlou Porch  normal low risk perfusion study  . CATARACT EXTRACTION W/ INTRAOCULAR LENS  IMPLANT, BILATERAL  2013  . COLONOSCOPY  2010  approx  . THYROID LOBECTOMY Right 05/05/2015   Procedure: RIGHT THYROID  LOBECTOMY;  Surgeon: TArmandina Gemma MD;  Location: [facility]  Service: General;  Laterality: Right;  . TRANSANAL HEMORRHOIDAL DEARTERIALIZATION N/A 04/09/2014   Procedure: TRANSANAL HEMORRHOIDAL DEARTERIALIZATION OF INTERNAL HEMORROIDS;  Surgeon: ALeighton Ruff MD;  [facility]  Service: General;  Laterality: N/A;  . TRANSTHORACIC ECHOCARDIOGRAM  11-26-2013   moderate focal basal and mild LVH/  ef 689-16%/ grade I diastolic dysfunction/ mild LAE/  moderate calcification with stenosis AV with mild regurg,  gradients 35 abd 58 mmHg /  mild TR         Family History  Problem Relation Age of Onset  . Pulmonary embolism Mother 462   caused by complications of surgery  . CVA Father   . Diabetes Brother   . Diabetes Brother   . Breast cancer Sister    Social History:  reports that he quit smoking about 35 years ago. His smoking use included Cigarettes. He started smoking about 43 years ago. He quit after 40.00 years of use. He has never used smokeless tobacco. He reports that he drinks alcohol. He reports that he does not use drugs. Allergies:       Allergies  Allergen Reactions  . Ativan [Lorazepam]     hallucinations         Medications Prior to Admission  Medication Sig Dispense Refill  . aspirin EC 81 MG tablet Take 81 mg by mouth daily.    .Marland KitchenDIOVAN 80 MG tablet Take 80 mg by mouth every morning.     . escitalopram (LEXAPRO) 20 MG tablet Take 20 mg by mouth daily.   4  . folic acid (FOLVITE) 1 MG tablet Take 1 mg by mouth daily.   0  . hydrochlorothiazide (MICROZIDE) 12.5 MG capsule Take 12.5 mg by mouth daily.    .Marland KitchenHYDROcodone-acetaminophen (NORCO/VICODIN) 5-325 MG tablet Take 1-2 tablets by mouth every 4 (four) hours as needed for moderate pain. 20 tablet 0  . ibuprofen (ADVIL,MOTRIN) 800 MG tablet Take 800 mg by mouth every 8 (eight) hours as needed for moderate pain.     . methotrexate (RHEUMATREX) 2.5 MG tablet Take 22.5 mg by mouth  once a week. TAKE ON SUNDAYS    . omeprazole (PRILOSEC) 20 MG capsule TAKE 1 CAPSULE(20 MG) BY MOUTH DAILY 15 capsule 0  . simvastatin (ZOCOR) 20 MG tablet TAKE 1 TABLET BY MOUTH EVERY NIGHT AT BEDTIME 30 tablet 0  . dutasteride (AVODART) 0.5 MG capsule Take 0.5 mg by mouth daily.  0    Home: Home Living Family/patient expects to be discharged to:: Private residence Living Arrangements: Children (daughter who works [place] of [place] in [place] Available Help at Discharge: Family, Available 24 hours/day (until 3/17 and then intermittently) Type of Home: House Home Access: Stairs to enter ECenterPoint Energyof Steps: 2 Home Layout: One level Bathroom Shower/Tub: TChiropodist Handicapped height Home Equipment: Grab bars - tub/shower   Functional History: Prior Function Level of Independence: Independent Comments: drives  Functional Status:  Mobility: Bed Mobility Overal bed mobility: Needs Assistance Bed Mobility: Supine to Sit, Rolling Rolling: Min assist Supine  to sit: Min assist Sit to supine: Supervision, HOB elevated (with bed rail) General bed mobility comments: bed mobility and supine to sit performed x 2; pt needing mod cues for proper sequencing and technique Transfers Overall transfer level: Needs assistance Equipment used: Rolling walker (2 wheeled) Transfers: Sit to/from Stand Sit to Stand: Min guard Stand pivot transfers: Min guard General transfer comment: deferred due to SOB Ambulation/Gait Ambulation/Gait assistance: Min guard Ambulation Distance (Feet): 170 Feet Assistive device: Rolling walker (2 wheeled) Gait Pattern/deviations: Step-through pattern, Decreased stride length, Drifts right/left General Gait Details: deferred due to SOB Gait velocity: decr Gait velocity interpretation: Below normal speed for age/gender  ADL: ADL Overall ADL's : Needs assistance/impaired Eating/Feeding: Independent, Sitting Grooming: Wash/dry  hands, Wash/dry face, Oral care, Supervision/safety, Standing Grooming Details (indicate cue type and reason): Pt stood at sink for appx 6 minutes with S. Upper Body Bathing: Set up, Sitting Lower Body Bathing: Moderate assistance, Sit to/from stand Lower Body Bathing Details (indicate cue type and reason): followed up on use of long sponge Upper Body Dressing : Minimal assistance, Sitting Upper Body Dressing Details (indicate cue type and reason): front opening gown Lower Body Dressing: Sit to/from stand, Minimal assistance Lower Body Dressing Details (indicate cue type and reason): practiced use of sock aide and demonstrated use of reacher for pants, removing sock Toilet Transfer: Min guard, RW, Cueing for safety Toilet Transfer Details (indicate cue type and reason): pt walked to bathroom. cues to reach back when sitting and to keep walker close by. Toileting- Water quality scientist and Hygiene: Min guard, Sit to/from stand, Cueing for safety Toileting - Clothing Manipulation Details (indicate cue type and reason): cues to not get up alone. Do not leave in bathroom alone; pt will get up. Functional mobility during ADLs: Minimal assistance, Rolling walker, Cueing for sequencing, Cueing for safety General ADL Comments: Educated family in safe techniques and need for someone to be with pt at all times at this point.  Cognition: Cognition Overall Cognitive Status: Within Functional Limits for tasks assessed Orientation Level: Oriented X4 Cognition Arousal/Alertness: Awake/alert Behavior During Therapy: Restless Overall Cognitive Status: Within Functional Limits for tasks assessed General Comments: pt restless due to increased SOB, needed breathing tx during session due to wheezing and SOB  Physical Exam: Blood pressure 136/61, pulse 62, temperature 98.1 F (36.7 C), temperature source Oral, resp. rate 16, height '5\' 9"'  (1.753 m), weight 82 kg (180 lb 11.2 oz), SpO2 95 %. Physical  Exam Vitals reviewed Constitutional. He appears well-developed. Mood and behavior appear normal Obese HEENT. Head normocephalic Eyes pupils round and reactive to light no discharge Neck. Supple nontender no JVD Cardiac RRR Chest- occasional rhonchi and exp wheezes GI. Abdomen mildly distended nontender he does have bowel sounds Musculoskeletal. He exhibits no edema. Low and mid back tender to palpation Skin. Warm and dry.  Neurological: He is alert.   He is able to follow basic commands with tactile cues.  Motor: B/L UE, RLE 5/5 proximal to distal LLE: HF and KE 4/5, distally 5/5 Sensation appears to be intact to light touchin all 4 limbs No obvious cognitive issues. Communication limited somewhat by language barrier Psych: pleasant and cooperative   Lab Results Last 48 Hours        Results for orders placed or performed during the hospital encounter of 08/13/16 (from the past 48 hour(s))  Culture, body fluid-bottle     Status: None (Preliminary result)   Collection Time: 08/24/16  5:29 PM  Result Value Ref Range  Specimen Description FLUID RIGHT PLEURAL    Special Requests BOTTLES DRAWN AEROBIC AND ANAEROBIC 10CC    Culture NO GROWTH < 24 HOURS    Report Status PENDING   Gram stain     Status: None   Collection Time: 08/24/16  5:29 PM  Result Value Ref Range   Specimen Description FLUID RIGHT PLEURAL    Special Requests NONE    Gram Stain      ABUNDANT WBC PRESENT,BOTH PMN AND MONONUCLEAR NO ORGANISMS SEEN    Report Status 08/24/2016 FINAL   CBC     Status: Abnormal   Collection Time: 08/25/16  3:09 AM  Result Value Ref Range   WBC 6.1 4.0 - 10.5 K/uL   RBC 3.25 (L) 4.22 - 5.81 MIL/uL   Hemoglobin 9.3 (L) 13.0 - 17.0 g/dL   HCT 29.1 (L) 39.0 - 52.0 %   MCV 89.5 78.0 - 100.0 fL   MCH 28.6 26.0 - 34.0 pg   MCHC 32.0 30.0 - 36.0 g/dL   RDW 14.6 11.5 - 15.5 %   Platelets 214 150 - 400 K/uL  Basic metabolic panel     Status:  Abnormal   Collection Time: 08/25/16  3:09 AM  Result Value Ref Range   Sodium 138 135 - 145 mmol/L   Potassium 3.9 3.5 - 5.1 mmol/L   Chloride 104 101 - 111 mmol/L   CO2 28 22 - 32 mmol/L   Glucose, Bld 96 65 - 99 mg/dL   BUN 15 6 - 20 mg/dL   Creatinine, Ser 1.08 0.61 - 1.24 mg/dL   Calcium 8.3 (L) 8.9 - 10.3 mg/dL   GFR calc non Af Amer >60 >60 mL/min   GFR calc Af Amer >60 >60 mL/min    Comment: (NOTE) The eGFR has been calculated using the CKD EPI equation. This calculation has not been validated in all clinical situations. eGFR's persistently <60 mL/min signify possible Chronic Kidney Disease.    Anion gap 6 5 - 15  CBC     Status: Abnormal   Collection Time: 08/26/16  4:41 AM  Result Value Ref Range   WBC 6.0 4.0 - 10.5 K/uL   RBC 3.39 (L) 4.22 - 5.81 MIL/uL   Hemoglobin 9.8 (L) 13.0 - 17.0 g/dL   HCT 30.3 (L) 39.0 - 52.0 %   MCV 89.4 78.0 - 100.0 fL   MCH 28.9 26.0 - 34.0 pg   MCHC 32.3 30.0 - 36.0 g/dL   RDW 14.3 11.5 - 15.5 %   Platelets 229 150 - 400 K/uL  Basic metabolic panel     Status: Abnormal   Collection Time: 08/26/16  4:41 AM  Result Value Ref Range   Sodium 137 135 - 145 mmol/L   Potassium 3.9 3.5 - 5.1 mmol/L   Chloride 100 (L) 101 - 111 mmol/L   CO2 29 22 - 32 mmol/L   Glucose, Bld 95 65 - 99 mg/dL   BUN 16 6 - 20 mg/dL   Creatinine, Ser 1.06 0.61 - 1.24 mg/dL   Calcium 8.3 (L) 8.9 - 10.3 mg/dL   GFR calc non Af Amer >60 >60 mL/min   GFR calc Af Amer >60 >60 mL/min    Comment: (NOTE) The eGFR has been calculated using the CKD EPI equation. This calculation has not been validated in all clinical situations. eGFR's persistently <60 mL/min signify possible Chronic Kidney Disease.    Anion gap 8 5 - 15  Protime-INR     Status: None  Collection Time: 08/26/16  8:05 AM  Result Value Ref Range   Prothrombin Time 14.9 11.4 - 15.2 seconds   INR 1.17   APTT     Status: None   Collection Time: 08/26/16   8:05 AM  Result Value Ref Range   aPTT 36 24 - 36 seconds      Imaging Results (Last 48 hours)  Ct Chest Wo Contrast  Result Date: 08/24/2016 CLINICAL DATA:  [place] from tree several days ago. Pneumomediastinum and pneumothorax previously noted. Subcutaneous gas previously noted. Short of breath following thoracentesis EXAM: CT CHEST WITHOUT CONTRAST TECHNIQUE: Multidetector CT imaging of the chest was performed following the standard protocol without IV contrast. COMPARISON:  CT chest 08/13/2016 chest radiograph 08/23/2016 FINDINGS: Cardiovascular: Again demonstrated small amount of gas in the anterior mediastinum and mediastinal fat anterior to the heart. No pericardial fluid. Airways appear normal. Esophagus normal. Mediastinum/Nodes: No axillary supraclavicular adenopathy. There is subcutaneous gas along the RIGHT chest wall extending to the RIGHT neck and to lesser degree the LEFT neck and left anterior chest. Lungs/Pleura: No pneumothorax present. There is bibasilar atelectasis pleural effusions. Upper Abdomen: Limited view of the upper abdomen demonstrates no peritoneal fluid. Adrenal glands are normal. Musculoskeletal: Fracture the posterior eighth rib, seventh rib. There are fractures noted in the bilateral pedicles at T10 (image 64, series 8 and image 57, series 8). Fractures are mildly distracted. Fracture extends the posterior wall of the T2 vertebral body. No loss of vertebral body height. Probable fracture the superior endplate of G92. No loss vertebral body height. No paraspinal or epidural hematoma evident. IMPRESSION: 1. No pneumothorax. 2. Bilateral small pleural effusions and medial basilar atelectasis. 3. Subcutaneous emphysema and pneumomediastinum not changed. 4. Posterior RIGHT seventh and eighth rib fractures. 5. BILATERAL SUBACUTE PEDICLE FRACTURES AT T10. These are UNSTABLE fractures. Recommend nonemergent neurosurgical consultation. 6. Probable superior endplate fracture J19.  Findings conveyed toFloor nurse, Chama on 08/24/2016  at18:36. Electronically Signed   By: Suzy Bouchard M.D.   On: 08/24/2016 18:44   Mr Thoracic Spine Wo Contrast  Result Date: 08/24/2016 CLINICAL DATA:  Follow-up T10 unstable fracture incidentally noted during CT-guided thoracentesis. EXAM: MRI THORACIC SPINE WITHOUT CONTRAST TECHNIQUE: Multiplanar, multisequence MR imaging of the thoracic spine was performed. No intravenous contrast was administered. COMPARISON:  CT chest August 24, 2016 at 1731 hours FINDINGS: ALIGNMENT: Maintenance of the thoracic kyphosis. No malalignment. VERTEBRAE/DISCS: Low T1 and bright STIR signal within the T10 and T11 vertebral bodies extending into the posterior elements. Linear low T1 through T10 posterior elements corresponding to known fracture. No retropulsed bony fragments. Mild (less than 20%) T11 vertebral body height loss with bright STIR signal within the T10-11 disc, to lesser extent at T9-10. Multilevel mild chronic discogenic endplate changes. Intervertebral disc heights generally preserved with slight desiccation and mineralization is evidenced on today's CT. CORD: Thoracic spinal cord is normal morphology and signal characteristics to the level of the conus medullaris which terminates at T12-L1. PREVERTEBRAL AND PARASPINAL SOFT TISSUES: T2 bright signal within the anterior longitudinal ligament T10-11, within the interspinous and supraspinous ligaments at T10-11. Mild bright STIR paraspinal muscle strain T10-11 and more caudal levels. Dependent pleural effusion and consolidation partially imaged. T2 bright cysts in the kidneys. DISC LEVELS: No disc bulge, canal stenosis or neural foraminal narrowing any level. IMPRESSION: Acute appearing T10-3 column fracture without height loss. Mild T11 acute compression fracture and edema extending to the posterior elements. Focally disrupted T10-11 ligaments. Low-grade lower thoracic paraspinal muscle strain. No  neurocompression. Electronically Signed   By: Elon Alas M.D.   On: 08/24/2016 23:50   Dg Chest Port 1 View  Result Date: 08/25/2016 CLINICAL DATA:  84 year old male status post fall with chest trauma including T10 Chance type fracture, right rib fractures, pleural effusions, pneumomediastinum and subcutaneous gas. EXAM: PORTABLE CHEST 1 VIEW COMPARISON:  Chest CT 08/24/2016 and earlier. FINDINGS: Portable AP semi upright view at 0645 hours. Stable mild right chest wall subcutaneous gas. Improved lung volumes since 08/23/2016. Mediastinal contours remain normal. No pneumothorax. No pulmonary edema. No pleural effusion is evident. No confluent pulmonary opacity. Stable right thoracic inlet surgical clips. Stable visualized osseous structures. IMPRESSION: 1. Improved lung volumes and ventilation since 08/23/2016. Pleural effusions are no longer evident, no confluent pulmonary opacity. 2. T10 Chance type fracture and right rib fractures better demonstrated on yesterday CT and MRI. Electronically Signed   By: Genevie Ann M.D.   On: 08/25/2016 07:34   US Thoracentesis Asp Pleural Space W/img Guide  Result Date: 08/25/2016 INDICATION: Patient with history of fall now with right pleural effusion. Request is made for diagnostic and therapeutic thoracentesis. EXAM: ULTRASOUND GUIDED DIAGNOSTIC AND THERAPEUTIC RIGHT THORACENTESIS MEDICATIONS: 10 mL 1% lidocaine COMPLICATIONS: None immediate. PROCEDURE: An ultrasound guided thoracentesis was thoroughly discussed with the patient and questions answered. The benefits, risks, alternatives and complications were also discussed. The patient understands and wishes to proceed with the procedure. Written consent was obtained. Ultrasound was performed to localize and mark an adequate pocket of fluid in the right chest. The area was then prepped and draped in the normal sterile fashion. 1% Lidocaine was used for local anesthesia. Under ultrasound guidance a  Safe-T-centesis catheter was introduced. Thoracentesis was performed. The catheter was removed and a dressing applied. FINDINGS: A total of approximately 900 mL of bloody fluid was removed. Samples were sent to the laboratory as requested by the clinical team. IMPRESSION: Successful ultrasound guided diagnostic and therapeutic right thoracentesis yielding 900 mL of pleural fluid. Read by:  Brynda Greathouse PA-C Electronically Signed   By: Sandi Mariscal M.D.   On: 08/24/2016 17:25        Medical Problem List and Plan: 1.  Polytrauma, right pneumothorax with right chest wall subcutaneous emphysema, right costochondral fractures, small anterior mediastinal hematoma, pneumoperitoneum as well as by vertebral fractures of T10, T11 -TLSO secondary to fall from a tree cutting branches             -pt admitted for inpatient rehab 2.  DVT Prophylaxis/Anticoagulation: Subcutaneous Lovenox 40 mg daily. Check vascular study on admit 3. Pain Management: Flexeril 5 mg 3 times a day, Ultram 50 mg every 6 hours             -fair control at present             -TLSO when OOB 4. Mood: Lexapro 20 mg daily 5. Neuropsych: This patient is capable of making decisions on his own behalf. 6. Skin/Wound Care: Routine skin checks 7. Fluids/Electrolytes/Nutrition: Routine I&O with follow-up chemistries personallly reviewed today             -encourage PO 8. Blood loss anemia. Follow-up CBC shows improvement today (11.2)             -I personally reviewed the patient's labs today.  9. Ileus. Advance diet as tolerated 10. Hypertension. Avapro 75 mg daily, HCTZ 12.5 mg daily 11. Hyperlipidemia. Zocor 12. Constipation. Laxative assistance   Post Admission Physician Evaluation: 1. Functional deficits secondary  to polytrauma as above. 2. Patient is admitted to receive collaborative, interdisciplinary care between the physiatrist, rehab nursing staff, and therapy team. 3. Patient's level of medical complexity and  substantial therapy needs in context of that medical necessity cannot be provided at a lesser intensity of care such as a SNF. 4. Patient has experienced substantial functional loss from his/her baseline which was documented above under the "Functional History" and "Functional Status" headings.  Judging by the patient's diagnosis, physical exam, and functional history, the patient has potential for functional progress which will result in measurable gains while on inpatient rehab.  These gains will be of substantial and practical use upon discharge  in facilitating mobility and self-care at the household level. 5. Physiatrist will provide 24 hour management of medical needs as well as oversight of the therapy plan/treatment and provide guidance as appropriate regarding the interaction of the two. 6. The Preadmission Screening has been reviewed and patient status is unchanged unless otherwise stated above. 7. 24 hour rehab nursing will assist with bladder management, bowel management, safety, skin/wound care, disease management, medication administration, pain management and patient education  and help integrate therapy concepts, techniques,education, etc. 8. PT will assess and treat for/with: Lower extremity strength, range of motion, stamina, balance, functional mobility, safety, adaptive techniques and equipment, NMR, pain mgt, surgical/back precautions, brace wear, family ed.   Goals are: mod I to supervision. 9. OT will assess and treat for/with: ADL's, functional mobility, safety, upper extremity strength, adaptive techniques and equipment, NMR, brace don/doff, family education, back precautions.   Goals are: mod I to supervision. Therapy may proceed with showering this patient. 10. SLP will assess and treat for/with: n/a.  Goals are: n/a. 11. Case Management and Social Worker will assess and treat for psychological issues and discharge planning. 12. Team conference will be held weekly to assess  progress toward goals and to determine barriers to discharge. 13. Patient will receive at least 3 hours of therapy per day at least 5 days per week. 14. ELOS: 12-16 days       15. Prognosis:  excellent     Meredith Staggers, MD, [facility] 08/29/2016  Cathlyn Parsons., PA-C 08/26/2016

## 2016-08-29 NOTE — Progress Notes (Signed)
Retta Diones, RN Rehab Admission Coordinator Signed Physical Medicine and Rehabilitation  PMR Pre-admission Date of Service: 08/26/2016 3:40 PM  Related encounter: ED to Hosp-Admission (Discharged) from 08/13/2016 in [facility]       [] Hide copied text PMR Admission Coordinator Pre-Admission Assessment  Patient: [name] is an 83 y.o., male MRN: [id number] DOB: January 22, 1928 Height: 5\' 9"  (175.3 cm) Weight: 74.3 kg (163 lb 12.8 oz)                                                                                                                                                  Insurance Information HMO: No   PPO:       PCP:       IPA:       80/20:       OTHER:   PRIMARY: Medicare A/B      Policy#: [id number]      Subscriber:  [name] CM Name:        Phone#:       Fax#:   Pre-Cert#:        Employer:  Retired Benefits:  Phone #:       Name: Checked in [place]. Date: A=08/12/98 and B=06/13/96     Deduct: $1340      Out of Pocket Max: None      Life Max: unlimited CIR: 100%      SNF: 100 days Outpatient: 80%     Co-Pay: 20% Home Health: 100%      Co-Pay: none DME: 80%     Co-Pay: 20% Providers: patient's choice  SECONDARY: Medicaid of [place]      Policy#: [id number]      Subscriber:  [name] CM Name:        Phone#:       Fax#:   Pre-Cert#:        Employer: Retired Benefits:  Phone #: [phone]     Name:  Automated Eff. Date:  Eligible 08/27/16 with coverage code [id number]     Deduct:        Out of Pocket Max:        Life Max:   CIR:        SNF:   Outpatient:       Co-Pay:   Home Health:        Co-Pay:   DME:       Co-Pay:    Emergency Contact Information        Contact Information    Name Relation Home Work Mobile   [place] Daughter [phone]  [phone]   Gregary Cromer Daughter [phone]       Current Medical History  Patient Admitting Diagnosis:  Polytrauma  History of Present Illness: An 83 y.o.right handed  [place] speaking malewith history of hypertension. Per chart review patient lives  alone. His daughter lives in the home next to him. Patient reported to be independent prior to admission. Question of 24 hour assistance on discharge. Presented 08/13/2016 after a fall from a tree while cutting branches. No report of loss of consciousness. Complaints of back pain. Cranial CT reviewed, unremarkable for acute process. CT cervical spine negative. CT of the chest abdomen and pelvis showed a right pneumothorax with right chest wall subcutaneous emphysema. Right costochondral fractures. Small anterior mediastinal hematoma. No evidence for great vessel injury or acute intra-abdominal injury. Conservative care of right pneumothorax.MRI thoracic spine for increasing back pain 08/24/2016 showed acute appearing T10 fracture without height loss. Mild T11 acute compression fracture and edema extending to the posterior element. No neurocompression. Neurosurgery Dr. Cyndy Freeze consulted advise conservative care with TLSO back brace applied. Patient underwent a right thoracentesis for right pleural effusion 08/24/2016 with a 900 mL yield of bloody fluid removed.Decrease in hemoglobin with follow-up CT abdomen and pelvis showed pneumoperitoneum of which patient was transfused close monitoring of CBC with latest hemoglobin 8.5. Patient developed nausea abdominal bloating as well as reports of chronic dysphagia with gastroenterology consulted 08/20/2016. A modified barium swallow was completedand advised regular diet. A plain abdominal film suggested mild large and small bowel ileus and no signs of peritonitis. A nasogastric tube was placed for decompressionand Diet slowly advanced. Hospital course pain management.Patient did have bouts of agitation and restlessness and was provided with a sitter for safety. Physical occupational therapy evaluations completed. Recommendations for physical medicine rehabilitation consult. Patient to  be admitted for a comprehensive inpatient rehabilitation program.   Past Medical History      Past Medical History:  Diagnosis Date  . At risk for sleep apnea    STOP-BANG= 4    SENT TO PCP 04-04-2014  . BPH (benign prostatic hypertrophy)   . Depression   . Diverticulosis of colon   . GERD (gastroesophageal reflux disease)   . Gilbert's syndrome   . Headache    migraines  . Hearing loss   . Heart murmur    "Calcified aortic valve"  . Hepatitis    Hep A as a child  . History of gastroenteritis   . History of kidney stones   . History of syncope    2002--  vasovagal  . HOH (hard of hearing)    refuses to wear his Hearing Aid  . Hypertension   . Inguinal hernia    right  . Internal hemorrhoid, bleeding   . Moderate aortic stenosis    a. Last echo 11/2013, 06/2014.  Marland Kitchen Nocturia   . Normal coronary arteries    a. Cath 07/2010: normal coronaries. Felt to have noncardiac CP/SOB at that time possibly r/t anxiety.  . RA (rheumatoid arthritis) ([place])    bilateral hands/ wrist--  seronegative  . Rotator cuff syndrome of right shoulder 08/01/2013  . Shortness of breath dyspnea    with exertion  . Stone, kidney   . Thyroid cyst   . Thyroid nodule    noted 11-2009  . Wears dentures     Family History  family history includes Breast cancer in his sister; CVA in his father; Diabetes in his brother and brother; Pulmonary embolism (age of onset: 8) in his mother.  Prior Rehab/Hospitalizations: No previous rehab admissions.  Has the patient had major surgery during 100 days prior to admission? No  Current Medications   Current Facility-Administered Medications:  .  aspirin EC tablet 81 mg, 81 mg, Oral, Daily, Rolm Bookbinder,  MD, 81 mg at 08/26/16 0954 .  bisacodyl (DULCOLAX) suppository 10 mg, 10 mg, Rectal, Daily PRN, Maryann Mikhail, DO .  cyclobenzaprine (FLEXERIL) tablet 5 mg, 5 mg, Oral, TID, Judeth Horn, MD, 5 mg at 08/26/16  2129 .  docusate sodium (COLACE) capsule 100 mg, 100 mg, Oral, BID, Clovis Riley, MD, 100 mg at 08/26/16 2122 .  enoxaparin (LOVENOX) injection 40 mg, 40 mg, Subcutaneous, Q24H, Monia Sabal, PA-C, 40 mg at 08/27/16 1131 .  escitalopram (LEXAPRO) tablet 20 mg, 20 mg, Oral, Daily, Rolm Bookbinder, MD, 20 mg at 08/26/16 0954 .  feeding supplement (ENSURE ENLIVE) (ENSURE ENLIVE) liquid 237 mL, 237 mL, Oral, BID BM, Teena Irani, MD, 237 mL at 08/25/16 1400 .  hydrochlorothiazide (MICROZIDE) capsule 12.5 mg, 12.5 mg, Oral, Daily, Rolm Bookbinder, MD, 12.5 mg at 08/26/16 0954 .  ibuprofen (ADVIL,MOTRIN) tablet 800 mg, 800 mg, Oral, Q8H PRN, Rolm Bookbinder, MD, 800 mg at 08/26/16 0112 .  ipratropium-albuterol (DUONEB) 0.5-2.5 (3) MG/3ML nebulizer solution 3 mL, 3 mL, Nebulization, Q2H PRN, Maryann Mikhail, DO, 3 mL at 08/26/16 0115 .  ipratropium-albuterol (DUONEB) 0.5-2.5 (3) MG/3ML nebulizer solution 3 mL, 3 mL, Nebulization, BID, Georganna Skeans, MD, 3 mL at 08/26/16 2049 .  irbesartan (AVAPRO) tablet 75 mg, 75 mg, Oral, Daily, Rolm Bookbinder, MD, 75 mg at 08/26/16 0954 .  magic mouthwash w/lidocaine, 10 mL, Oral, QID, Coralie Keens, MD, 10 mL at 08/27/16 1427 .  magnesium hydroxide (MILK OF MAGNESIA) suspension 15 mL, 15 mL, Oral, Daily PRN, Clovis Riley, MD .  menthol-cetylpyridinium (CEPACOL) lozenge 3 mg, 1 lozenge, Oral, PRN, Judeth Horn, MD .  [DISCONTINUED] ondansetron [facility]) tablet 4 mg, 4 mg, Oral, Q6H PRN **OR** ondansetron (ZOFRAN) injection 4 mg, 4 mg, Intravenous, Q6H PRN, Rolm Bookbinder, MD, 4 mg at 08/20/16 1605 .  pantoprazole (PROTONIX) EC tablet 40 mg, 40 mg, Oral, Daily, Rolm Bookbinder, MD, 40 mg at 08/27/16 1428 .  phenol (CHLORASEPTIC) mouth spray 1 spray, 1 spray, Mouth/Throat, PRN, Georganna Skeans, MD, 1 spray at 08/27/16 0816 .  polyethylene glycol (MIRALAX / GLYCOLAX) packet 17 g, 17 g, Oral, Daily, Jessica L Focht, PA, 17 g at 08/26/16 0954 .   simvastatin (ZOCOR) tablet 20 mg, 20 mg, Oral, QHS, Rolm Bookbinder, MD, 20 mg at 08/26/16 2122 .  traMADol (ULTRAM) tablet 50 mg, 50 mg, Oral, Q6H, Georganna Skeans, MD, 50 mg at 08/27/16 1428  Patients Current Diet: Diet Heart Room service appropriate? Yes; Fluid consistency: Thin  Precautions / Restrictions Precautions Precautions: Fall Precaution Comments: Pt accustomed to being independent so needs chair alarm when up.  Not safe to get up alone Restrictions Weight Bearing Restrictions: No   Has the patient had 2 or more falls or a fall with injury in the past year?No  Prior Activity Level Community (5-7x/wk): Went out daily, was active, was driving.  Home Assistive Devices / Equipment Home Assistive Devices/Equipment: None Home Equipment: Grab bars - tub/shower  Prior Device Use: Indicate devices/aids used by the patient prior to current illness, exacerbation or injury? None  Prior Functional Level Prior Function Level of Independence: Independent Comments: drives  Self Care: Did the patient need help bathing, dressing, using the toilet or eating?  Independent  Indoor Mobility: Did the patient need assistance with walking from room to room (with or without device)? Independent  Stairs: Did the patient need assistance with internal or external stairs (with or without device)? Independent  Functional Cognition: Did the patient need help planning regular tasks  such as shopping or remembering to take medications? Independent  Current Functional Level Cognition  Overall Cognitive Status: Within Functional Limits for tasks assessed Orientation Level: Oriented X4 General Comments: pt restless due to increased SOB, needed breathing tx during session due to wheezing and SOB    Extremity Assessment (includes Sensation/Coordination)  Upper Extremity Assessment: Defer to OT evaluation  Lower Extremity Assessment: Generalized weakness    ADLs  Overall ADL's :  Needs assistance/impaired Eating/Feeding: Independent, Sitting Grooming: Wash/dry hands, Wash/dry face, Oral care, Supervision/safety, Standing Grooming Details (indicate cue type and reason): Pt stood at sink for appx 6 minutes with S. Upper Body Bathing: Set up, Sitting Lower Body Bathing: Moderate assistance, Sit to/from stand Lower Body Bathing Details (indicate cue type and reason): followed up on use of long sponge Upper Body Dressing : Minimal assistance, Sitting Upper Body Dressing Details (indicate cue type and reason): front opening gown Lower Body Dressing: Sit to/from stand, Minimal assistance Lower Body Dressing Details (indicate cue type and reason): practiced use of sock aide and demonstrated use of reacher for pants, removing sock Toilet Transfer: Min guard, RW, Cueing for safety Toilet Transfer Details (indicate cue type and reason): pt walked to bathroom. cues to reach back when sitting and to keep walker close by. Toileting- Water quality scientist and Hygiene: Min guard, Sit to/from stand, Cueing for safety Toileting - Clothing Manipulation Details (indicate cue type and reason): cues to not get up alone. Do not leave in bathroom alone; pt will get up. Functional mobility during ADLs: Minimal assistance, Rolling walker, Cueing for sequencing, Cueing for safety General ADL Comments: Educated family in safe techniques and need for someone to be with pt at all times at this point.    Mobility  Overal bed mobility: Needs Assistance Bed Mobility: Supine to Sit, Rolling Rolling: Min assist Supine to sit: Min assist Sit to supine: Supervision, HOB elevated (with bed rail) General bed mobility comments: bed mobility and supine to sit performed x 2; pt needing mod cues for proper sequencing and technique    Transfers  Overall transfer level: Needs assistance Equipment used: Rolling walker (2 wheeled) Transfers: Sit to/from Stand Sit to Stand: Min guard Stand pivot  transfers: Min guard General transfer comment: deferred due to SOB    Ambulation / Gait / Stairs / Emergency planning/management officer  Ambulation/Gait Ambulation/Gait assistance: Physicist, medical (Feet): 170 Feet Assistive device: Rolling walker (2 wheeled) Gait Pattern/deviations: Step-through pattern, Decreased stride length, Drifts right/left General Gait Details: deferred due to SOB Gait velocity: decr Gait velocity interpretation: Below normal speed for age/gender    Posture / Balance Balance Overall balance assessment: Needs assistance Sitting-balance support: No upper extremity supported Sitting balance-Leahy Scale: Fair (pt restless sitting EOB) Standing balance support: During functional activity, Single extremity supported Standing balance-Leahy Scale: Poor Standing balance comment: UE assist for standing.    Special needs/care consideration BiPAP/CPAP No CPM No Continuous Drip IV No Dialysis No        Life Vest No Oxygen No Special Bed No Trach Size No Wound Vac (area) No     Skin No                           Bowel mgmt: Last BM 08/24/16 Bladder mgmt: Had a condom catheter Diabetic mgmt No    Previous Home Environment Living Arrangements: Children (daughter who works most of time in [place]) Available Help at Discharge: Family, Available 24 hours/day (until 3/17 and  then intermittently) Type of Home: House Home Layout: One level Home Access: Stairs to enter [employer] of Steps: 2 Bathroom Shower/Tub: Chiropodist: Handicapped [place]: No  Discharge Living Setting Plans for Discharge Living Setting: House, Lives with (comment) (Plans to return to daughter's house.) Type of Home at Discharge: House Discharge Home Layout: One level Discharge Home Access: Stairs to enter Entrance Stairs-Number of Steps: 3 steps Does the patient have any problems obtaining your medications?: No  Social/Family/Support  Systems Patient Roles: Parent (Has daughters and granddaughters.) Contact Information: Fortino Haag - daughter Anticipated Caregiver: Daughters, grand daughters and hired help as needed Anticipated Ambulance person Information: [name] - daughter - [phone] Ability/Limitations of Caregiver: [name] lives in [place], but will be in town next week.  Inna lives 3 houses down the street from patient Caregiver Availability: 24/7 (Family to rotate caregiver assistance and provide 24/7 ) I spoke with daughter, [name], who assures me that family will provide the care after rehab and that she will hire help to cover if a family member is not available. Discharge Plan Discussed with Primary Caregiver: Yes Is Caregiver In Agreement with Plan?: Yes Does Caregiver/Family have Issues with Lodging/Transportation while Pt is in Rehab?: No  Goals/Additional Needs Patient/Family Goal for Rehab: PT/OT mod I and supervision goals Expected length of stay: 12-16 days Cultural Considerations: [place], Jewish, no pork, understands English if speak slowly.  Recommend interpreter if we can get one as needed. Dietary Needs: Heart diet, thin liquids Equipment Needs: TBD Pt/Family Agrees to Admission and willing to participate: Yes Program Orientation Provided & Reviewed with Pt/Caregiver Including Roles  & Responsibilities: Yes  Decrease burden of Care through IP rehab admission: N/A  Possible need for SNF placement upon discharge: Not anticipated.  Family prefers to go home with caregivers after rehab stay.  Patient Condition: This patient's medical and functional status has changed since the consult dated: 08/23/16 in which the Rehabilitation Physician determined and documented that the patient's condition is appropriate for intensive rehabilitative care in an inpatient rehabilitation facility. See "History of Present Illness" (above) for medical update. Functional changes are: Currently requiring minguard to  ambulate 170 feet RW. Patient's medical and functional status update has been discussed with the Rehabilitation physician and patient remains appropriate for inpatient rehabilitation. Will admit to inpatient rehab tomorrow.  Preadmission Screen Completed By:  Retta Diones, 08/27/2016 3:59 PM ______________________________________________________________________   Discussed status with Dr. Naaman Plummer on 08/27/16 at 6 and received telephone approval for admission tomorrow.  Admission Coordinator:  Retta Diones, time [address]umbling 08/27/16       Cosigned by: Meredith Staggers, MD at 08/27/2016 5:46 PM

## 2016-08-29 NOTE — Progress Notes (Signed)
Physical Therapy Session Note  Patient Details  Name: [name] MRN: [id number] Date of Birth: Jul 27, 1927  Today's Date: 08/29/2016 PT Individual Time: 1478-2956 PT Individual Time Calculation (min): 25 min   Short Term Goals: Week 1:  PT Short Term Goal 1 (Week 1): Pt will negotiate 3 steps without rails with min assist. PT Short Term Goal 2 (Week 1): Pt will ambulate 150 ft with LRAD & supervision. PT Short Term Goal 3 (Week 1): Pt will complete all transfers with supervision and LRAD.  Skilled Therapeutic Interventions/Progress Updates:  Pt received in w/c in room reporting stomach & throat pain - RN made aware. No family/friends or professional interpretor present; interpreter scheduled to be present for therapy tomorrow. Pt ambulated room<>gym with RW & min assist with pt demonstrating inconsistent step length & width LLE. Pt also requires cuing to ambulate within base of RW. Pt utilized nu-step up to level 3 x 10 minutes with all 4 extremities with task focusing on endurance training. Pt did not require any rest breaks. At end of session pt left sitting in w/c in room with QRB donned & all needs within reach.   Pt requires cuing for safety as he will attempt to ambulate without RW or push it to the side when he is close to his w/c.  Therapy Documentation Precautions:  Precautions Precautions: Fall, Back Precaution Comments: Pt accustomed to being independent so needs chair alarm when up.  Not safe to get up alone Required Braces or Orthoses: Spinal Brace Spinal Brace: Thoracolumbosacral orthotic, Applied in sitting position Restrictions Weight Bearing Restrictions: No   See Function Navigator for Current Functional Status.   Therapy/Group: Individual Therapy  [name] 08/29/2016, 3:57 PM

## 2016-08-30 ENCOUNTER — Inpatient Hospital Stay (HOSPITAL_COMMUNITY): Payer: Medicare Other | Admitting: Physical Therapy

## 2016-08-30 ENCOUNTER — Inpatient Hospital Stay (HOSPITAL_COMMUNITY): Payer: Medicare Other

## 2016-08-30 DIAGNOSIS — K567 Ileus, unspecified: Secondary | ICD-10-CM

## 2016-08-30 DIAGNOSIS — M7989 Other specified soft tissue disorders: Secondary | ICD-10-CM

## 2016-08-30 DIAGNOSIS — I10 Essential (primary) hypertension: Secondary | ICD-10-CM

## 2016-08-30 DIAGNOSIS — S22079S Unspecified fracture of T9-T10 vertebra, sequela: Secondary | ICD-10-CM

## 2016-08-30 MED ORDER — PHENOL 1.4 % MT LIQD
1.0000 | OROMUCOSAL | Status: DC | PRN
  Filled 2016-08-30: qty 177

## 2016-08-30 MED ORDER — NYSTATIN 100000 UNIT/ML MT SUSP
5.0000 mL | Freq: Four times a day (QID) | OROMUCOSAL | Status: DC
  Administered 2016-08-30 – 2016-09-01 (×8): 500000 [IU] via OROMUCOSAL
  Filled 2016-08-30 (×14): qty 5

## 2016-08-30 NOTE — Plan of Care (Signed)
Problem: RH Car Transfers Goal: LTG Patient will perform car transfers with assist (PT) LTG: Patient will perform car transfers with assistance (PT).  Downgrade 2/2 progress & cognition  Problem: RH Ambulation Goal: LTG Patient will ambulate in controlled environment (PT) LTG: Patient will ambulate in a controlled environment, # of feet with assistance (PT).  150 ft with LRAD; downgrade 2/2 progress & cognition Goal: LTG Patient will ambulate in home environment (PT) LTG: Patient will ambulate in home environment, # of feet with assistance (PT).  43 ft with LRAD; downgrade 2/2 progress & cognition

## 2016-08-30 NOTE — Progress Notes (Signed)
PHYSICAL MEDICINE & REHABILITATION     PROGRESS NOTE    Subjective/Complaints: Complains of throat ailment---after much discussion it appears he has a sore throat. Slept well. No breathing issues  ROS: pt denies nausea, vomiting, diarrhea, cough, shortness of breath or chest pain   Objective: Vital Signs: Blood pressure (!) 147/56, pulse 65, temperature 98.2 F (36.8 C), temperature source Oral, resp. rate 18, height 5\' 6"  (1.676 m), weight 74.5 kg (164 lb 3.9 oz), SpO2 100 %. No results found.  Recent Labs  08/29/16 0624  WBC 6.3  HGB 11.2*  HCT 34.9*  PLT 267    Recent Labs  08/29/16 0624  NA 135  K 4.7  CL 98*  GLUCOSE 104*  BUN 17  CREATININE 1.19  CALCIUM 8.4*   CBG (last 3)  No results for input(s): GLUCAP in the last 72 hours.  Wt Readings from Last 3 Encounters:  08/30/16 74.5 kg (164 lb 3.9 oz)  08/28/16 75 kg (165 lb 6.4 oz)  05/05/15 77.1 kg (170 lb)    Physical Exam:  Constitutional. He appears well-developed. Mood and behavior appear normal Obese HEENT.oral mucosa with white debris/coating.  Head normocephalic Eyes pupils round and reactive to light no discharge Neck. Supple nontender no JVD Cardiac RRR Chest- normal effort. Tender to palp GI. Abdomen mildly distended nontender he does have bowel sounds Musculoskeletal. He exhibits no edema. Low and mid back tender to palpation Skin. Warm and dry.  Neurological: He is alert.   Motor: B/LUE, RLE 5/5 proximal to distal LLE: HF and KE4/5, distally 5/5--stable Sensation appears to be intact to light touchin all 4 limbs No obvious cognitive issues. Nods "yes" frequently although he doesn't understand Psych: pleasant and cooperative  Assessment/Plan: 1. Functional and mobility deficits secondary to polytrauma which require 3+ hours per day of interdisciplinary therapy in a comprehensive inpatient rehab setting. Physiatrist is providing close team supervision and 24 hour  management of active medical problems listed below. Physiatrist and rehab team continue to assess barriers to discharge/monitor patient progress toward functional and medical goals.  Function:  Bathing Bathing position   Position: Wheelchair/chair at sink  Bathing parts Body parts bathed by patient: Right arm, Left arm, Chest, Abdomen, Front perineal area, Buttocks, Right upper leg, Left upper leg, Left lower leg Body parts bathed by helper: Right lower leg, Back  Bathing assist Assist Level: Touching or steadying assistance(Pt > 75%)      Upper Body Dressing/Undressing Upper body dressing   What is the patient wearing?: Pull over shirt/dress     Pull over shirt/dress - Perfomed by patient: Thread/unthread right sleeve, Thread/unthread left sleeve, Put head through opening, Pull shirt over trunk Pull over shirt/dress - Perfomed by helper: Pull shirt over trunk        Upper body assist Assist Level: Set up, Supervision or verbal cues   Set up : To apply TLSO, cervical collar (total assist for TLSO)  Lower Body Dressing/Undressing Lower body dressing   What is the patient wearing?: Pants Underwear - Performed by patient: Thread/unthread right underwear leg, Thread/unthread left underwear leg, Pull underwear up/down   Pants- Performed by patient: Thread/unthread right pants leg, Thread/unthread left pants leg, Pull pants up/down       Socks - Performed by patient: Don/doff right sock, Don/doff left sock   Shoes - Performed by patient: Don/doff right shoe, Don/doff left shoe (Min assist) Shoes - Performed by helper: Don/doff right shoe, Don/doff left shoe  Lower body assist Assist for lower body dressing: Touching or steadying assistance (Pt > 75%)      Toileting Toileting Toileting activity did not occur: No continent bowel/bladder event Toileting steps completed by patient: Adjust clothing prior to toileting, Performs perineal hygiene, Adjust clothing after  toileting      Toileting assist Assist level: Touching or steadying assistance (Pt.75%)   Transfers Chair/bed transfer Chair/bed transfer activity did not occur: Safety/medical concerns Chair/bed transfer method: Stand pivot Chair/bed transfer assist level: Touching or steadying assistance (Pt > 75%) Chair/bed transfer assistive device: Armrests     Locomotion Ambulation     Max distance: 150 ft Assist level: Supervision or verbal cues   Wheelchair Wheelchair activity did not occur: Safety/medical concerns   Max wheelchair distance: 100 Assist Level: Supervision or verbal cues  Cognition Comprehension Comprehension assist level: Understands basic 90% of the time/cues < 10% of the time  Expression Expression assist level: Expresses basic 90% of the time/requires cueing < 10% of the time.  Social Interaction Social Interaction assist level: Interacts appropriately with others with medication or extra time (anti-anxiety, antidepressant).  Problem Solving Problem solving assist level: Solves basic 90% of the time/requires cueing < 10% of the time  Memory Memory assist level: Recognizes or recalls 90% of the time/requires cueing < 10% of the time   Medical Problem List and Plan: 1. Polytrauma, right pneumothorax with right chest wall subcutaneous emphysema, right costochondral fractures, small anterior mediastinal hematoma, pneumoperitoneum as well as by vertebral fractures of T10, T11 -TLSO secondary to fall from a tree cutting branches -team conference today 2. DVT Prophylaxis/Anticoagulation: Subcutaneous Lovenox 40 mg daily. Check vascular study on admit 3. Pain Management: Flexeril 5 mg 3 times a day, Ultram 50 mg every 6 hours -fair control at present -TLSO when OOB 4. Mood: Lexapro 20 mg daily 5. Neuropsych: This patient iscapable of making decisions on hisown behalf. 6. Skin/Wound Care: Routine skin checks 7.  Fluids/Electrolytes/Nutrition: Routine I&O with follow-up chemistries personallly reviewed today -encourage PO 8.Blood loss anemia. Follow-up CBC shows improvement today (11.2) -I personally reviewed the patient's labs today.  9.Ileus. Tolerating diet 10.Hypertension. Avapro 75 mg daily, HCTZ 12.5 mg daily 11.Hyperlipidemia. Zocor 12.Constipation. Laxative assistance 13. ?sore throat----fungal? On diflucan  -added nystatin mouth/throat wash  -chloraseptic spray LOS (Days) 2 A FACE TO FACE EVALUATION WAS PERFORMED  Meredith Staggers, MD 08/30/2016 9:08 AM

## 2016-08-30 NOTE — Plan of Care (Signed)
Problem: RH Balance Goal: LTG Patient will maintain dynamic standing balance (PT) LTG:  Patient will maintain dynamic standing balance with assistance during mobility activities (PT)  With LRAD; Downgrade 2/2 progress & impaired awareness

## 2016-08-30 NOTE — Progress Notes (Signed)
*  PRELIMINARY RESULTS* Vascular Ultrasound Bilateral lower extremity venous duplex has been completed.  Preliminary findings: Small segment of acute, non-occlusive deep vein thrombosis seen in the right posterior tibial vein.  Other visualized veins of the lower extremities appear negative for thrombosis.  Negative for bakers cysts bilaterally.  Preliminary results called to nurse, [facility] @ 11:55  Everrett Coombe 08/30/2016, 11:46 AM

## 2016-08-30 NOTE — Progress Notes (Signed)
Occupational Therapy Session Note  Patient Details  Name: [name] MRN: [id number] Date of Birth: 1927-08-17  Today's Date: 08/30/2016 OT Individual Time: 5625-6389 OT Individual Time Calculation (min): 56 min    Short Term Goals: Week 1:  OT Short Term Goal 1 (Week 1): pt will sit to stand with supervision in prep for clothing management OT Short Term Goal 2 (Week 1): Pt will groom in standing for 2 grooming items to improve functional endurance OT Short Term Goal 3 (Week 1): Pt will complete tub transfer with supervision and LRAD OT Short Term Goal 4 (Week 1): Pt will complete LB dressing with supervision and AE prn  Skilled Therapeutic Interventions/Progress Updates:    Pt asleep in bed upon arrival but easily aroused.  Interpreter present.  Pt declined shower/bathing/dressing this morning.  Pt initially stated that he thought he was ready to go home today and he could take shower when he returned home. Pt informed that a discharge date would be established during conference today.  Pt stated he just ate a big breakfast and was concerned that if he took a shower he might get an upset stomach.  Pt agreeable to getting OOB and amb with RW to ADL apartment.  Pt stated that he had a walk-in shower with grab bars and grab bars beside toilet.  Pt practiced bed mobility, RW safety (issued walker bag), and simple home mgmt tasks. Pt amb with RW back to room and remained in w/c with QRB in place and all needs within reach, awaiting next therapy.   Therapy Documentation Precautions:  Precautions Precautions: Fall, Back Precaution Comments: Pt accustomed to being independent so needs chair alarm when up.  Not safe to get up alone Required Braces or Orthoses: Spinal Brace Spinal Brace: Thoracolumbosacral orthotic, Applied in sitting position Restrictions Weight Bearing Restrictions: No  Pain:  Pt denied pain but c/o discomfort in top of throat when swallowing; RN aware  See Function  Navigator for Current Functional Status.   Therapy/Group: Individual Therapy  Leroy Libman 08/30/2016, 9:57 AM

## 2016-08-30 NOTE — Progress Notes (Addendum)
Physical Therapy Session Note  Patient Details  Name: [name] MRN: [id number] Date of Birth: 22-Aug-1927  Today's Date: 08/30/2016 PT Individual Time: 1001-1045 and 9702-6378 PT Individual Time Calculation (min): 44 min and 80 min  Short Term Goals: Week 1:  PT Short Term Goal 1 (Week 1): Pt will negotiate 3 steps without rails with min assist. PT Short Term Goal 2 (Week 1): Pt will ambulate 150 ft with LRAD & supervision. PT Short Term Goal 3 (Week 1): Pt will complete all transfers with supervision and LRAD.  Skilled Therapeutic Interventions/Progress Updates:  Treatment 1: Pt received in room with professional interpreter present. Pt c/o pain in throat but reports it is better than yesterday. Pt ambulated room<>gym with RW & steady assist with intermittent impaired step length & width LLE. Pt utilized cybex kinetron in sitting then standing with BUE support with task focusing on BLE strengthening. Pt performed standing cone taps with RW for BUE support then without BUE support with task focusing on LE coordination and balance. Attempted to have pt complete Berg Balance Test but pt noted significant fatigue & requested to lie down. Pt returned to room and therapist doffed TLSO total assist. Attempted to provided instructional education & cuing for log rolling technique but pt impulsive and transferred sit>supine. At end of session pt left in bed with alarm set, RN & interpreter present, and all needs within reach. Notified RN of pt becoming diaphoretic during session. Pt unable to recall back injury, reporting he only hurt his ribs. Therapist educated on T10-11 fx, back precautions, and purpose of wearing TLSO with pt voicing understanding.  Treatment 2: Professional interpreter present for session. Pt received in bed & agreeable to tx. Pt without c/o pain during session. Educated pt on importance of maintaining back precautions when getting in/out of bed. Pt able to complete log rolling to sit  at EOB with steady assist and max multimodal cuing. Therapist donned pt's TLSO total assist and pt required assistance to manage heel of shoes when donning slippers. Pt ambulated room<>gym with RW & supervision. Pt completed Berg Balance Test scoring 30/56; educated pt on interpretation of score & current fall risk. Patient demonstrates increased fall risk as noted by score of 30/56 on Berg Balance Scale.  (<36= high risk for falls, close to 100%; 37-45 significant >80%; 46-51 moderate >50%; 52-55 lower >25%). Pt performed stair negotiation with 2 rails and 1 rail. Educated pt on importance of increasing balance to allow him to negotiate stairs at home without rails. Pt believes a lot of his deficits are caused by his poor appetite and low intake; educated pt on importance of eating to have energy to participate in therapy but also educated him on potential causes of current deficits. Pt stood on compliant surfaces with eyes open and closed without BUE support with task focusing on balance training. Pt utilized nu-step up to level 2 x 10 minutes with all 4 extremities with task focusing on endurance training. During task pt with labored breathing but denies SOB; HR = 103-105 bpm, SpO2 = 93-98%.  At end of session pt left sitting in w/c in room with all needs within reach & QRB donned; pt's daughter and RN present.    Therapy Documentation Precautions:  Precautions Precautions: Fall, Back, TLSO Required Braces or Orthoses: Spinal Brace Spinal Brace: Thoracolumbosacral orthotic, Applied in sitting position Restrictions Weight Bearing Restrictions: No   General: PT Amount of Missed Time (min): 16 Minutes PT Missed Treatment Reason: Patient fatigue  Vital Signs: Therapy Vitals Pulse Rate: (!) 103 BP: 119/63 Patient Position (if appropriate): Sitting Oxygen Therapy SpO2: 97 % O2 Device: Not Delivered  Balance: Balance Balance Assessed: Yes Standardized Balance Assessment Standardized Balance  Assessment: Berg Balance Test Berg Balance Test Sit to Stand: Able to stand without using hands and stabilize independently Standing Unsupported: Able to stand safely 2 minutes Sitting with Back Unsupported but Feet Supported on Floor or Stool: Able to sit safely and securely 2 minutes Stand to Sit: Sits safely with minimal use of hands Transfers: Able to transfer with verbal cueing and /or supervision Standing Unsupported with Eyes Closed: Able to stand 10 seconds with supervision Standing Ubsupported with Feet Together: Able to place feet together independently and stand for 1 minute with supervision From Standing, Reach Forward with Outstretched Arm: Reaches forward but needs supervision (reaches forward 7 inches) From Standing Position, Pick up Object from Floor: Unable to try/needs assist to keep balance (unable to attempt 2/2 TLSO and back precautions) From Standing Position, Turn to Look Behind Over each Shoulder: Needs assist to keep from losing balance and falling (unable 2/2 back precautions and TLSO) Turn 360 Degrees: Needs close supervision or verbal cueing Standing Unsupported, Alternately Place Feet on Step/Stool: Able to complete 4 steps without aid or supervision (completes 8 taps with close supervision) Standing Unsupported, One Foot in Front: Able to take small step independently and hold 30 seconds Standing on One Leg: Unable to try or needs assist to prevent fall Total Score: 30   See Function Navigator for Current Functional Status.   Therapy/Group: Individual Therapy  [name] 08/30/2016, 5:40 PM

## 2016-08-31 ENCOUNTER — Inpatient Hospital Stay (HOSPITAL_COMMUNITY): Payer: Medicare Other | Admitting: Physical Therapy

## 2016-08-31 ENCOUNTER — Inpatient Hospital Stay (HOSPITAL_COMMUNITY): Payer: Medicare Other

## 2016-08-31 MED ORDER — HYDROCORTISONE 1 % EX CREA
TOPICAL_CREAM | Freq: Three times a day (TID) | CUTANEOUS | Status: DC
  Administered 2016-08-31 – 2016-09-01 (×5): via TOPICAL
  Administered 2016-09-02: 1 via TOPICAL
  Administered 2016-09-02 – 2016-09-05 (×11): via TOPICAL
  Filled 2016-08-31 (×3): qty 28

## 2016-08-31 NOTE — Progress Notes (Signed)
[place] PHYSICAL MEDICINE & REHABILITATION     PROGRESS NOTE    Subjective/Complaints: Feeling better today. Complains of some back pain. Throat seems to be doing better.   ROS: Limited due to language barrier    Objective: Vital Signs: Blood pressure (!) 107/52, pulse 76, temperature 98.6 F (37 C), temperature source Oral, resp. rate 18, height 5\' 6"  (1.676 m), weight 74.6 kg (164 lb 7.4 oz), SpO2 98 %. No results found.  Recent Labs  08/29/16 0624  WBC 6.3  HGB 11.2*  HCT 34.9*  PLT 267    Recent Labs  08/29/16 0624  NA 135  K 4.7  CL 98*  GLUCOSE 104*  BUN 17  CREATININE 1.19  CALCIUM 8.4*   CBG (last 3)  No results for input(s): GLUCAP in the last 72 hours.  Wt Readings from Last 3 Encounters:  08/31/16 74.6 kg (164 lb 7.4 oz)  08/28/16 75 kg (165 lb 6.4 oz)  05/05/15 77.1 kg (170 lb)    Physical Exam:  Constitutional. He appears well-developed. Mood and behavior appear normal Obese HEENT.oral mucosa with decreased white debris/coating.  Head normocephalic Eyes pupils round and reactive to light no discharge Neck. Supple nontender no JVD CardiacRRR Chest- CTA B GI. Abdomen mildly distended nontender he does have bowel sounds Musculoskeletal. He exhibits no edema. Low and mid back tender to palpation Skin. Warm and dry.  Neurological: He is alert.   Motor: B/LUE, RLE 5/5 proximal to distal LLE: HF and KE4/5, distally 5/5--no changes Sensation appears to be intact to light touchin all 4 limbs No obvious cognitive issues. Nods "yes" frequently although he doesn't understand Psych: pleasant and cooperative  Assessment/Plan: 1. Functional and mobility deficits secondary to polytrauma which require 3+ hours per day of interdisciplinary therapy in a comprehensive inpatient rehab setting. Physiatrist is providing close team supervision and 24 hour management of active medical problems listed below. Physiatrist and rehab team continue to assess  barriers to discharge/monitor patient progress toward functional and medical goals.  Function:  Bathing Bathing position   Position: Shower  Bathing parts Body parts bathed by patient: Right arm, Left arm, Chest, Abdomen, Front perineal area, Buttocks, Right upper leg, Left upper leg, Left lower leg, Back, Right lower leg Body parts bathed by helper: Right lower leg, Back  Bathing assist Assist Level: Supervision or verbal cues      Upper Body Dressing/Undressing Upper body dressing   What is the patient wearing?: Pull over shirt/dress, Orthosis     Pull over shirt/dress - Perfomed by patient: Thread/unthread right sleeve, Thread/unthread left sleeve, Put head through opening, Pull shirt over trunk Pull over shirt/dress - Perfomed by helper: Pull shirt over trunk        Upper body assist Assist Level: Set up, Supervision or verbal cues   Set up : To apply TLSO, cervical collar (tot A to don TLSO)  Lower Body Dressing/Undressing Lower body dressing   What is the patient wearing?: Ted Hose Underwear - Performed by patient: Thread/unthread right underwear leg, Thread/unthread left underwear leg, Pull underwear up/down   Pants- Performed by patient: Thread/unthread right pants leg, Thread/unthread left pants leg, Pull pants up/down       Socks - Performed by patient: Don/doff right sock, Don/doff left sock   Shoes - Performed by patient: Don/doff right shoe, Don/doff left shoe Shoes - Performed by helper: Don/doff right shoe, Don/doff left shoe       TED Hose - Performed by helper: Don/doff right  TED hose, Don/doff left TED hose  Lower body assist Assist for lower body dressing: Touching or steadying assistance (Pt > 75%)      Toileting Toileting Toileting activity did not occur: No continent bowel/bladder event Toileting steps completed by patient: Performs perineal hygiene, Adjust clothing prior to toileting, Adjust clothing after toileting   Toileting Assistive  Devices: Grab bar or rail  Toileting assist Assist level: More than reasonable time   Transfers Chair/bed transfer Chair/bed transfer activity did not occur: Safety/medical concerns Chair/bed transfer method: Stand pivot Chair/bed transfer assist level: Touching or steadying assistance (Pt > 75%) Chair/bed transfer assistive device: Armrests     Locomotion Ambulation     Max distance: 150 ft Assist level: Supervision or verbal cues   Wheelchair Wheelchair activity did not occur: Safety/medical concerns   Max wheelchair distance: 100 Assist Level: Supervision or verbal cues  Cognition Comprehension Comprehension assist level: Understands basic 90% of the time/cues < 10% of the time  Expression Expression assist level: Expresses basic 90% of the time/requires cueing < 10% of the time.  Social Interaction Social Interaction assist level: Interacts appropriately with others with medication or extra time (anti-anxiety, antidepressant).  Problem Solving Problem solving assist level: Solves basic 90% of the time/requires cueing < 10% of the time  Memory Memory assist level: Recognizes or recalls 90% of the time/requires cueing < 10% of the time   Medical Problem List and Plan: 1. Polytrauma, right pneumothorax with right chest wall subcutaneous emphysema, right costochondral fractures, small anterior mediastinal hematoma, pneumoperitoneum as well as by vertebral fractures of T10, T11 -TLSO secondary to fall from a tree cutting branches -continue CIR therapies 2. DVT Prophylaxis/Anticoagulation: Subcutaneous Lovenox 40 mg daily. Check vascular study on admit 3. Pain Management: Flexeril 5 mg 3 times a day, Ultram 50 mg every 6 hours -fair control at present -TLSO when OOB--explained reasoning for brace today 4. Mood: Lexapro 20 mg daily 5. Neuropsych: This patient iscapable of making decisions on hisown behalf. 6. Skin/Wound Care: Routine skin  checks 7. Fluids/Electrolytes/Nutrition: Routine I&O with follow-up chemistries personallly reviewed today -encourage PO 8.Blood loss anemia. Follow-up CBC shows improvement today (11.2) -I personally reviewed the patient's labs today.  9.Ileus. Tolerating diet 10.Hypertension. Avapro 75 mg daily, HCTZ 12.5 mg daily 11.Hyperlipidemia. Zocor 12.Constipation. Laxative assistance 13. ?sore throat---improved  -continued diflucan and nystatin mouth/throat wash  -chloraseptic spray   LOS (Days) 3 A FACE TO FACE EVALUATION WAS PERFORMED  Meredith Staggers, MD 08/31/2016 5:13 PM

## 2016-08-31 NOTE — Progress Notes (Signed)
Social Work Assessment and Plan  Patient Details  Name: Demaurion Dicioccio MRN: [id number] Date of Birth: August 26, 1927  Today's Date: 08/30/2016  Problem List:  Patient Active Problem List   Diagnosis Date Noted  . Closed T10 spinal fracture ([place]) 08/29/2016  . Abdominal distention   . Benign essential HTN   . Pneumoperitoneum   . Pain   . Acute blood loss anemia   . Tachypnea   . Hemoperitoneum   . Adynamic ileus ([place])   . Nausea in adult   . Acute diastolic heart failure ([place])   . Ileus ([place]) 08/20/2016  . Dysphagia 08/20/2016  . Chest pain 08/20/2016  . Pneumothorax 08/13/2016  . 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 Aortic stenosis 06/20/2014  . Chest tightness 06/20/2014  . Essential hypertension 06/20/2014  . Insomnia 06/20/2014  . Sinus bradycardia 06/20/2014  . Rectal bleeding 01/20/2014  . Aortic valve disorders 11/07/2013  . Rotator cuff syndrome of right shoulder 08/01/2013   Past Medical History:  Past Medical History:  Diagnosis Date  . At risk for sleep apnea    STOP-BANG= 4    SENT TO PCP 04-04-2014  . BPH (benign prostatic hypertrophy)   . Depression   . Diverticulosis of colon   . GERD (gastroesophageal reflux disease)   . Gilbert's syndrome   . Headache    migraines  . Hearing loss   . Heart murmur    "Calcified aortic valve"  . Hepatitis    Hep A as a child  . History of gastroenteritis   . History of kidney stones   . History of syncope    2002--  vasovagal  . HOH (hard of hearing)    refuses to wear his Hearing Aid  . Hypertension   . Inguinal hernia    right  . Internal hemorrhoid, bleeding   . Moderate aortic stenosis    a. Last echo 11/2013, 06/2014.  Marland Kitchen Nocturia   . Normal coronary arteries    a. Cath 07/2010: normal coronaries. Felt to have noncardiac CP/SOB at that time possibly r/t anxiety.  . RA (rheumatoid arthritis) ([place])    bilateral hands/ wrist--  seronegative  . Rotator cuff syndrome of  right shoulder 08/01/2013  . Shortness of breath dyspnea    with exertion  . Stone, kidney   . Thyroid cyst   . Thyroid nodule    noted 11-2009  . Wears dentures    Past Surgical History:  Past Surgical History:  Procedure Laterality Date  . CARDIAC CATHETERIZATION  07-19-2010  dr Tressia Miners turner   normal coronary arteries,  ef 60%,  moderate aortic stenosis- gradient 57mHg, normal right heart pressure  . CARDIOVASCULAR STRESS TEST  05/ 2011   dr sMarlou Porch  normal low risk perfusion study  . CATARACT EXTRACTION W/ INTRAOCULAR LENS  IMPLANT, BILATERAL  2013  . COLONOSCOPY  2010  approx  . THYROID LOBECTOMY Right 05/05/2015   Procedure: RIGHT THYROID LOBECTOMY;  Surgeon: TArmandina Gemma MD;  Location: [facility]  Service: General;  Laterality: Right;  . TRANSANAL HEMORRHOIDAL DEARTERIALIZATION N/A 04/09/2014   Procedure: TRANSANAL HEMORRHOIDAL DEARTERIALIZATION OF INTERNAL HEMORROIDS;  Surgeon: ALeighton Ruff MD;  [facility]  Service: General;  Laterality: N/A;  . TRANSTHORACIC ECHOCARDIOGRAM  11-26-2013   moderate focal basal and mild LVH/  ef 634-19%/ grade I diastolic dysfunction/ mild LAE/  moderate calcification with stenosis AV with mild regurg,  gradients 35 abd 58 mmHg /  mild TR   Social History:  reports that he quit smoking about 35 years ago. His smoking use included Cigarettes. He started smoking about 43 years ago. He quit after 40.00 years of use. He has never used smokeless tobacco. He reports that he drinks alcohol. He reports that he does not use drugs.  Family / Support Systems Marital Status: Widow/Widower Patient Roles: Parent Children: Theophilus Bones - dtr - [phone]; Gregary Cromer - dtr - [phone];   Other Supports: Lenda Kelp - niece - [phone] Anticipated Caregiver: Daughters, grand daughters and hired help as needed.  Dtr would also like PCS initiated. Ability/Limitations of Caregiver: [name] lives in [place], but will be in town next  week until 09-03-16.  Inna lives 3 houses down the street from patient Caregiver Availability: 24/7 (Dtr, [name], is aware of need for 24/7 supervision and she will work with family to arrange this.) Family Dynamics: caring, supportive family  Social History Preferred language: [place] Religion: Non-Denominational Read: Yes Write: Yes Employment Status: Retired Public relations account executive Issues: none reported Guardian/Conservator: N/A - MD has determined that pt is capable of making his own decisions.   Abuse/Neglect Physical Abuse: Denies Verbal Abuse: Denies Sexual Abuse: Denies Exploitation of patient/patient's resources: Denies Self-Neglect: Denies  Emotional Status Pt's affect, behavior and adjustment status: Pt appeared in good spirits.  He did not talk much during visit, as dtr did most of the talking and they do not want the interpreter present when discussing d/c plans.  Dtr agreed to keep pt informed of what is being discussed and decided.  Dtr reported he is doing fine emotionally. Recent Psychosocial Issues: none reported Psychiatric History: Depression documented in chart, but dtr did not talk about this.  Noted pt is taking Lexapro. Substance Abuse History: none reported  Patient / Family Perceptions, Expectations & Goals Pt/Family understanding of illness & functional limitations: Pt/family seem to have a good understanding of pt's condition.  Dtr's main questions are for therapy team and CSW suggested she come and go through therapy with pt.  She wants to know when pt will no longer need supervision at home. She is also concerned that pt will not be strong enough to go home on targeted d/c date of 09-06-16.   Premorbid pt/family roles/activities: Pt was independent and active PTA. Anticipated changes in roles/activities/participation: Pt will not be pruning or doing other jobs that would potentially put him at risk of falling. Pt/family expectations/goals: To get pt  stronger and steady on his feet.  Community Resources Express Scripts: None Premorbid Home Care/DME Agencies: None Transportation available at discharge: family Resource referrals recommended: Neuropsychology  Discharge Planning Living Arrangements: Children Support Systems: Children, Other relatives Type of Residence: Private residence Insurance Resources: [employer], [employer] (specify county[place] ([place]) Museum/gallery curator Resources: Radio broadcast assistant Screen Referred: No Living Expenses: Lives with family Money Management: Family Does the patient have any problems obtaining your medications?: No Home Management: Pt and family take care of the home.  Family will take care of this while pt is recovering. Patient/Family Preliminary Plans: Pt plans to return to Dora's home at d/c and she is arranging 24/7 supervision. Barriers to Discharge: Steps Social Work Anticipated Follow Up Needs: HH/OP Expected length of stay: 7 to 14 days  Clinical Impression CSW met with pt, pt's dtr [name]), and a male visitor to introduce self and role of CSW as well as to complete assessment and provide team conference update.  Pt lives in dtr's home and she lives  in [place]. His other dtr lives 3 doors down.  Family is working to arrange 24/7 supervision with family, hired caregivers, and PCS.  CSW tried to be very clear that PCS cannot be set up until within 24 hours of d/c and that [facility] would not be set, scheduled time that could be counted on for supervision time at a regular time and that PCS was only a few hours a day and not everyday.  Strongly encouraged dtr to go ahead and make arrangements for 24/7 supervision now with date of 09-06-16 for d/c in mind, not to wait for PCS to be arranged.  She expressed understanding.  CSW will make PCS referral on 09-05-16 and will confirm pt's d/c date with dtr on 09-02-16.  CSW remains available to assist as needed. , Silvestre Mesi 08/31/2016, 12:32 PM

## 2016-08-31 NOTE — Plan of Care (Signed)
Problem: RH Dressing Goal: LTG Patient will perform upper body dressing (OT) LTG Patient will perform upper body dressing with assist, with/without cues (OT).  Downgraded due to decr cognition for safety JLS  Problem: RH Simple Meal Prep Goal: LTG Patient will perform simple meal prep w/assist (OT) LTG: Patient will perform simple meal prep with assistance, with/without cues (OT).  Downgraded due to decr cognition for safety JLS  Problem: RH Laundry Goal: LTG Patient will perform laundry w/assist, cues (OT) LTG: Patient will perform laundry with assistance, with/without cues (OT).  Downgraded due to decr cognition for safety JLS  Problem: RH Light Housekeeping Goal: LTG Patient will perform light housekeeping w/assist (OT) LTG: Patient will perform light housekeeping with assistance, with/without cues (OT).  Downgraded due to decr cognition for safety JLS  Problem: RH Memory Goal: LTG Patient will demonstrate ability for day to day (OT) LTG:  Patient will demonstrate ability for day to day recall/carryover during activities of daily living with assist  (OT)  Downgraded Will need supervision for safety awareness JLS

## 2016-08-31 NOTE — Progress Notes (Addendum)
Social Work Patient ID: [name], male   DOB: 1928-02-12, 83 y.o.   MRN: [id number]   CSW met with pt, pt's dtr [name]), and a male visitor to update them on team conference discussion and to meet them for assessment visit.  Explained how CIR works and that we have set a targeted d/c date of 09-06-16 for pt.  Dtr questioned if pt would be strong enough by then and CSW explained that we will continue to evaluate that he will meet the supervision level goals and make changes as needed.  CSW plans to check in with therapists on Friday to confirm date so that dtr will know in advance, but told her to anticipate Tuesday.  CSW will check in with [facility], but explained to dtr that usually CSW can only make referral within 24 hours.  CSW does know that pt's preferred agency is [facility].  Dtr thought that they were going to fax something to CSW, but they have not.  CSW will reach out to them.  Dtr does not want to talk about d/c plans with interpreter present, so she requested that CSW talk with dtr only re: plans.  CSW agreed to do so, but stated that she must keep pt included in what is discussed and planned.  She agreed.  Pt was appreciative of CSW visit.  CSW will continue to follow and assist as needed.

## 2016-08-31 NOTE — Progress Notes (Signed)
Physical Therapy Session Note  Patient Details  Name: [name] MRN: [id number] Date of Birth: 06-Mar-1928  Today's Date: 08/31/2016 PT Individual Time: 0915-1000 and 2993-7169 PT Individual Time Calculation (min): 45 min and 75 min  Short Term Goals: Week 1:  PT Short Term Goal 1 (Week 1): Pt will negotiate 3 steps without rails with min assist. PT Short Term Goal 2 (Week 1): Pt will ambulate 150 ft with LRAD & supervision. PT Short Term Goal 3 (Week 1): Pt will complete all transfers with supervision and LRAD.  Skilled Therapeutic Interventions/Progress Updates:  Treatment 1: Pt received in bed requesting to rest to let his food digest since he just finished eating breakfast. Therapist returned a few minutes later & pt agreeable to tx. Pt required cuing to log roll for supine>sitting EOB and cuing to prevent impulsivity; pt able to perform log rolling with supervision and use of hospital bed features. Educated pt on donning of TLSO at EOB and pt required max assist to don brace and maintain back precautions. Pt with difficulty securing straps. Pt ambulated room<>gym with RW & supervision. In gym pt stood on compliant surface without BUE support while assembling pipe tree shapes. Pt required min cuing for error correction when assembling simple & minimally complex shapes; pt only required close supervision for standing balance. Pt negotiated stairs with single rail and without rail with supervision and min assist respectively. Pt with improving balance on this date compared to yesterday, but still demonstrates impaired control and balance when descending stairs without use of rails. Reviewed pt's back precautions, donning of TLSO and injuries as pt continues to report he only injured his ribs. Pt able to recall back precautions with extra time. At end of session pt left sitting in w/c with QRB donned, all needs within reach & interpreter present.   Treatment 2: Pt received sitting on EOB eating  lunch but agreeable to therapy at this time. Pt without c/o pain during session. Pt ambulated room<>gym with RW & supervision. Gait training throughout obstacle course (weaving through cones, stepping over poles, negotiating uneven surface) with RW & close supervision with cuing for foot approximation to poles. Pt demonstrates shuffled gait pattern when weaving between cones and impaired balance when turning. Pt utilized Biodex Limits of Stability and Postural Control with BUE progressing to no UE support with task focusing on postural control and weight shifting. Pt with good ability to weight shift in all directions, but with increased challenges to balance with dynamic biodex base. Pt engaged in zoom ball for endurance training; pt able to participate 1 minute + 1 minute before noting fatigue & requiring a seated rest break. Pt completed 10x sit<>stand transfers without BUE support with mat table at lowest height for BLE strengthening. Pt utilized nu-step up to level 4 x 10 minutes with all four extremities with task focusing on endurance training. Pt returned to room and requesting to eat lunch but declining to eat with TLSO donned. Educated pt on need to have TLSO donned if sitting up but pt continued to decline and transferred sit>supine impulsively despite continued education for maintaining back precautions. Pt agreeable to eat lunch in bed & reports he will figure out something at home regarding wearing brace while eating. Reinforced need for pt to wear TLSO at all times at home when OOB. At end of session pt left in bed with alarm set & set up with meal tray, all needs within reach.  Professional interpreter present for both sessions on  this date.   Therapy Documentation Precautions:  Precautions Precautions: Fall, Back Required Braces or Orthoses: Spinal Brace Spinal Brace: Thoracolumbosacral orthotic, Applied in sitting position Restrictions Weight Bearing Restrictions: No   General: PT  Amount of Missed Time (min): 15 Minutes PT Missed Treatment Reason:  (pt requesting to rest after eating breakfast)    See Function Navigator for Current Functional Status.   Therapy/Group: Individual Therapy  [name] 08/31/2016, 3:41 PM

## 2016-08-31 NOTE — Progress Notes (Signed)
Inpatient Rehabilitation Center Individual Statement of Services  Patient Name:  [name]  Date:  08/31/2016  Welcome to the [facility].  Our goal is to provide you with an individualized program based on your diagnosis and situation, designed to meet your specific needs.  With this comprehensive rehabilitation program, you will be expected to participate in at least 3 hours of rehabilitation therapies Monday-Friday, with modified therapy programming on the weekends.  Your rehabilitation program will include the following services:  Physical Therapy (PT), Occupational Therapy (OT), 24 hour per day rehabilitation nursing, Case Management (Social Worker), Rehabilitation Medicine, Nutrition Services and Pharmacy Services  Weekly team conferences will be held on Tuesdays to discuss your progress.  Your Social Worker will talk with you frequently to get your input and to update you on team discussions.  Team conferences with you and your family in attendance may also be held.  Expected length of stay:  7 to 14 days  Overall anticipated outcome:  Supervision  Depending on your progress and recovery, your program may change. Your Social Worker will coordinate services and will keep you informed of any changes. Your Social Worker's name and contact numbers are listed  below.  The following services may also be recommended but are not provided by the [facility] will be made to provide these services after discharge if needed.  Arrangements include referral to agencies that provide these services.  Your insurance has been verified to be:  Medicare and Medicaid Your primary doctor is:  Dr. Lajean Manes  Pertinent information will be shared with your doctor and your insurance company.  Social Worker:  Alfonse Alpers, LCSW  [phone] or  ([phone]  Information discussed with and copy given to patient by: Trey Sailors, 08/31/2016, 12:10 PM

## 2016-08-31 NOTE — IPOC Note (Signed)
Overall Plan of Care [facility]) Patient Details Name: [name] MRN: [id number] DOB: Jun 26, 1927  Admitting Diagnosis: level 2  Hospital Problems: Principal Problem:   Closed T10 spinal fracture ([place]) Active Problems:   Ileus ([place])   Benign essential HTN   Pneumoperitoneum     Functional Problem List: Nursing Bladder, Bowel, Pain, Safety, Endurance, Medication Management  PT Balance, Endurance, Motor, Safety, Sensory  OT Balance, Endurance, Safety, Pain  SLP    TR         Basic ADL's: OT Grooming, Bathing, Dressing, Toileting     Advanced  ADL's: OT Simple Meal Preparation, Laundry, Light Housekeeping, Other (comment) (money management; medication management)     Transfers: PT Bed Mobility, Bed to Chair, Musician, Manufacturing systems engineer, Metallurgist: PT Ambulation, Emergency planning/management officer, Stairs     Additional Impairments: OT    SLP        TR      Anticipated Outcomes Item Anticipated Outcome  Self Feeding MOD I  Swallowing      Basic self-care  MOD I  Toileting  MOD I   Bathroom Transfers MOD I toilet; S for bathing  Bowel/Bladder  manage bowel and bladder with mod I assist  Transfers  Mod I with LRAD  Locomotion  Mod I with LRAD  Communication     Cognition     Pain  pain at or below level 3 with prn medication  Safety/Judgment  maintain safety with supervision/cues/reminders   Therapy Plan: PT Intensity: Minimum of 1-2 x/day ,45 to 90 minutes PT Frequency: 5 out of 7 days PT Duration Estimated Length of Stay: 1-2 weeks OT Intensity: Minimum of 1-2 x/day, 45 to 90 minutes OT Frequency: 5 out of 7 days OT Duration/Estimated Length of Stay: 10-14 days         Team Interventions: Nursing Interventions Patient/Family Education, Disease Management/Prevention, Skin Care/Wound Management, Discharge Planning, Pain Management, Bowel Management, Medication Management  PT interventions Ambulation/gait training, Community reintegration,  DME/adaptive equipment instruction, Neuromuscular re-education, Psychosocial support, Stair training, UE/LE Strength taining/ROM, Wheelchair propulsion/positioning, UE/LE Coordination activities, Therapeutic Activities, Pain management, Discharge planning, Training and development officer, Disease management/prevention, Cognitive remediation/compensation, Functional mobility training, Patient/family education, Splinting/orthotics, Therapeutic Exercise, Visual/perceptual remediation/compensation  OT Interventions Training and development officer, Academic librarian, Discharge planning, DME/adaptive equipment instruction, Functional mobility training, Patient/family education, Pain management, Psychosocial support, Self Care/advanced ADL retraining, Therapeutic Activities, Therapeutic Exercise, UE/LE Strength taining/ROM, UE/LE Coordination activities  SLP Interventions    TR Interventions    SW/CM Interventions Discharge Planning, Psychosocial Support, Patient/Family Education    Team Discharge Planning: Destination: PT-Home ,OT- Home , SLP-  Projected Follow-up: PT-Home health PT (intermittent supervision), OT-  Home health OT, SLP-  Projected Equipment Needs: PT-To be determined, OT- Tub/shower bench, SLP-  Equipment Details: PT- , OT-  Patient/family involved in discharge planning: PT- Family member/caregiver, Patient,  OT-Patient, Family member/caregiver, SLP-   MD ELOS: 10-14 days Medical Rehab Prognosis:  Excellent Assessment: The patient has been admitted for CIR therapies with the diagnosis of polytrauma including T10 fracture. The team will be addressing functional mobility, strength, stamina, balance, safety, adaptive techniques and equipment, self-care, bowel and bladder mgt, patient and caregiver education, NMR, brace don/doff, pain control, ego support, community reintegration. Goals have been set at mod I for basic mobility and self-care, supervision for brace/safety.    Meredith Staggers,  MD, FAAPMR      See Team Conference Notes for weekly updates to the plan of care

## 2016-08-31 NOTE — Progress Notes (Signed)
Social Work Patient ID: [name], male   DOB: 03/01/1928, 83 y.o.   MRN: [id number]   Lynnda Child, LCSW Social Worker Signed   Patient Care Conference Date of Service: 08/31/2016 11:39 AM      Hide copied text Hover for attribution information Inpatient RehabilitationTeam Conference and Plan of Care Update Date: 08/30/2016   Time: 2:40 PM      Patient Name: [name]      Medical Record Number: [id number]  Date of Birth: [birth year] Sex: Male         Room/Bed: 4W13C/4W13C-01 Payor Info: Payor: MEDICARE / Plan: MEDICARE PART A AND B / Product Type: *No Product type* /     Admitting Diagnosis: level 2  Admit Date/Time:  08/28/2016 12:11 PM Admission Comments: No comment available    Primary Diagnosis:  Closed T10 spinal fracture (HCC) Principal Problem: Closed T10 spinal fracture [facility])       Patient Active Problem List    Diagnosis Date Noted  . Closed T10 spinal fracture ([place]) 08/29/2016  . Abdominal distention    . Benign essential HTN    . Pneumoperitoneum    . Pain    . Acute blood loss anemia    . Tachypnea    . Hemoperitoneum    . Adynamic ileus ([place])    . Nausea in adult    . Acute diastolic heart failure ([place])    . Ileus ([place]) 08/20/2016  . Dysphagia 08/20/2016  . Chest pain 08/20/2016  . Pneumothorax 08/13/2016  . 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 Aortic stenosis 06/20/2014  . Chest tightness 06/20/2014  . Essential hypertension 06/20/2014  . Insomnia 06/20/2014  . Sinus bradycardia 06/20/2014  . Rectal bleeding 01/20/2014  . Aortic valve disorders 11/07/2013  . Rotator cuff syndrome of right shoulder 08/01/2013      Expected Discharge Date: Expected Discharge Date: 09/06/16   Team Members Present: Physician leading conference: Ilean Skill, PsyD;Dr. Alger Simons Social Worker Present: Alfonse Alpers, LCSW Nurse Present: Elliot Cousin, RN PT Present: Lavone Nian, PT OT Present: Roanna Epley,  [place], OT SLP Present: Weston Anna, SLP PPS Coordinator present : Daiva Nakayama, RN, CRRN       Current Status/Progress Goal Weekly Team Focus  Medical     polytrauma with PTX, rib fractures and T10 spine fractures  improve functional mobility  pain control, nutrition, ileus   Bowel/Bladder     pt is continent of bowel and bladder  remain continent of bowel and bladder  continue streghtening and increase independance   Swallow/Nutrition/ Hydration               ADL's     bathing-min A; LB dressing-min A; UB dressing-supervision but requires assistance with TLSO; functional transfers-steady A; fatigues easily  mod I overall  activity tolerance, BADL retraining, functional transfers,safety awareness, family educaiton   Mobility     min assist overall  mod I with LRAD  endurance, strength, balance, gait, transfers, stair negotiation   Communication               Safety/Cognition/ Behavioral Observations             Pain     pt denies pain with pain management plan ( tramadol q 6 hours)  continue pain regimen   continued stregthing and improvement without increase in pain   Skin     no skin issue only scattered brusing  resolve brusing  continue  healthy skin status and improve brused areas     Rehab Goals Patient on target to meet rehab goals: Yes Rehab Goals Revised: none - pt's first conference *See Care Plan and progress notes for long and short-term goals.   Barriers to Discharge: pain and activity tolerance, nturition     Possible Resolutions to Barriers:  continued nutritional management, pain control     Discharge Planning/Teaching Needs:  Pt to return to his home at d/c with 24/7 supervision from family and paid caregivers and hopefully PCS.  Family/caregivers can come for family education closer to d/c.   Team Discussion:  Pt fell pruning a tree and has rib and spine fractures and pneumothorax.  Pt with sore throat and Dr. Naaman Plummer thinks it's from the  thrush and prescribed mouthwash, diflucan, and mouth spray.  Pt with right calf DVT per RN.  Pt is asking to go home soon, but he is at min assist and goals are for supervision.  Home is set up well for ADLs.  Pt is impulsive and has poor balance.    Revisions to Treatment Plan:  none    Continued Need for Acute Rehabilitation Level of Care: The patient requires daily medical management by a physician with specialized training in physical medicine and rehabilitation for the following conditions: Daily direction of a multidisciplinary physical rehabilitation program to ensure safe treatment while eliciting the highest outcome that is of practical value to the patient.: Yes Daily medical management of patient stability for increased activity during participation in an intensive rehabilitation regime.: Yes Daily analysis of laboratory values and/or radiology reports with any subsequent need for medication adjustment of medical intervention for : Post surgical problems;Nutritional problems   Kourtney Montesinos, Silvestre Mesi 08/31/2016, 11:39 AM

## 2016-08-31 NOTE — Progress Notes (Signed)
Occupational Therapy Session Note  Patient Details  Name: [name] MRN: [id number] Date of Birth: [birth year]  Today's Date: 08/31/2016 OT Individual Time: 1000-1055 OT Individual Time Calculation (min): 55 min    Short Term Goals: Week 1:  OT Short Term Goal 1 (Week 1): pt will sit to stand with supervision in prep for clothing management OT Short Term Goal 2 (Week 1): Pt will groom in standing for 2 grooming items to improve functional endurance OT Short Term Goal 3 (Week 1): Pt will complete tub transfer with supervision and LRAD OT Short Term Goal 4 (Week 1): Pt will complete LB dressing with supervision and AE prn  Skilled Therapeutic Interventions/Progress Updates:    Pt engaged in BADL retraining including bathing at shower level and dressing with sit<>stand from chair.  Pt utilized LH sponge to assist with bathing tasks.  Pt amb with RW to bathroom to tub transfer bench for shower.  Pt continues to require min verbal cues for safety awareness and wanted to walk back into room before TLSO donned.  Pt continues to require assistance with donning/doffing TLSO.  Pt completed bathing/dressing tasks without assistance and min verbal cues for safety awareness.  Pt stated he was fatigued after shower and requested to return to bed at completion of session.  Pt remained in bed with all needs within reach and bed alarm activated.   Therapy Documentation Precautions:  Precautions Precautions: Fall, Back Precaution Comments: Pt accustomed to being independent so needs chair alarm when up.  Not safe to get up alone Required Braces or Orthoses: Spinal Brace Spinal Brace: Thoracolumbosacral orthotic, Applied in sitting position Restrictions Weight Bearing Restrictions: No  Pain: Pain Assessment Pain Assessment: No/denies pain  See Function Navigator for Current Functional Status.   Therapy/Group: Individual Therapy  Leroy Libman 08/31/2016, 10:56 AM

## 2016-08-31 NOTE — Patient Care Conference (Signed)
Inpatient RehabilitationTeam Conference and Plan of Care Update Date: 08/30/2016   Time: 2:40 PM    Patient Name: [name]      Medical Record Number: [id number]  Date of Birth: 1927-08-07 Sex: Male         Room/Bed: 4W13C/4W13C-01 Payor Info: Payor: MEDICARE / Plan: MEDICARE PART A AND B / Product Type: *No Product type* /    Admitting Diagnosis: level 2  Admit Date/Time:  08/28/2016 12:11 PM Admission Comments: No comment available   Primary Diagnosis:  Closed T10 spinal fracture (HCC) Principal Problem: Closed T10 spinal fracture [facility])  Patient Active Problem List   Diagnosis Date Noted  . Closed T10 spinal fracture ([place]) 08/29/2016  . Abdominal distention   . Benign essential HTN   . Pneumoperitoneum   . Pain   . Acute blood loss anemia   . Tachypnea   . Hemoperitoneum   . Adynamic ileus ([place])   . Nausea in adult   . Acute diastolic heart failure ([place])   . Ileus ([place]) 08/20/2016  . Dysphagia 08/20/2016  . Chest pain 08/20/2016  . Pneumothorax 08/13/2016  . 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 Aortic stenosis 06/20/2014  . Chest tightness 06/20/2014  . Essential hypertension 06/20/2014  . Insomnia 06/20/2014  . Sinus bradycardia 06/20/2014  . Rectal bleeding 01/20/2014  . Aortic valve disorders 11/07/2013  . Rotator cuff syndrome of right shoulder 08/01/2013    Expected Discharge Date: Expected Discharge Date: 09/06/16  Team Members Present: Physician leading conference: Ilean Skill, PsyD;Dr. Alger Simons Social Worker Present: Alfonse Alpers, LCSW Nurse Present: Elliot Cousin, RN PT Present: Lavone Nian, PT OT Present: Roanna Epley, [place], OT SLP Present: Weston Anna, SLP PPS Coordinator present : Daiva Nakayama, RN, CRRN     Current Status/Progress Goal Weekly Team Focus  Medical   polytrauma with PTX, rib fractures and T10 spine fractures  improve functional mobility  pain control, nutrition,  ileus   Bowel/Bladder   pt is continent of bowel and bladder  remain continent of bowel and bladder  continue streghtening and increase independance   Swallow/Nutrition/ Hydration             ADL's   bathing-min A; LB dressing-min A; UB dressing-supervision but requires assistance with TLSO; functional transfers-steady A; fatigues easily  mod I overall  activity tolerance, BADL retraining, functional transfers,safety awareness, family educaiton   Mobility   min assist overall  mod I with LRAD  endurance, strength, balance, gait, transfers, stair negotiation   Communication             Safety/Cognition/ Behavioral Observations            Pain   pt denies pain with pain management plan ( tramadol q 6 hours)  continue pain regimen   continued stregthing and improvement without increase in pain   Skin   no skin issue only scattered brusing  resolve brusing  continue healthy skin status and improve brused areas    Rehab Goals Patient on target to meet rehab goals: Yes Rehab Goals Revised: none - pt's first conference *See Care Plan and progress notes for long and short-term goals.  Barriers to Discharge: pain and activity tolerance, nturition    Possible Resolutions to Barriers:  continued nutritional management, pain control    Discharge Planning/Teaching Needs:  Pt to return to his home at d/c with 24/7 supervision from family and paid caregivers and hopefully PCS.  Family/caregivers can come  for family education closer to d/c.   Team Discussion:  Pt fell pruning a tree and has rib and spine fractures and pneumothorax.  Pt with sore throat and Dr. Naaman Plummer thinks it's from the thrush and prescribed mouthwash, diflucan, and mouth spray.  Pt with right calf DVT per RN.  Pt is asking to go home soon, but he is at min assist and goals are for supervision.  Home is set up well for ADLs.  Pt is impulsive and has poor balance.    Revisions to Treatment Plan:  none   Continued Need for  Acute Rehabilitation Level of Care: The patient requires daily medical management by a physician with specialized training in physical medicine and rehabilitation for the following conditions: Daily direction of a multidisciplinary physical rehabilitation program to ensure safe treatment while eliciting the highest outcome that is of practical value to the patient.: Yes Daily medical management of patient stability for increased activity during participation in an intensive rehabilitation regime.: Yes Daily analysis of laboratory values and/or radiology reports with any subsequent need for medication adjustment of medical intervention for : Post surgical problems;Nutritional problems  Jozey Janco, Silvestre Mesi 08/31/2016, 11:39 AM

## 2016-08-31 NOTE — Progress Notes (Signed)
Patient refused scheduled ultram and and flexeril at lunch. Patient did take ultram at 1800 dose for right rib pain. Patient took MMW and nystatin this pm and became strangled coughing and required 1-2 minutes to stop coughing. Instructed patient to swish and swallow MMW and swish nystatin and spit. Patient resting at this time. Continue with plan of care.  Mliss Sax

## 2016-09-01 ENCOUNTER — Inpatient Hospital Stay (HOSPITAL_COMMUNITY): Payer: Medicare Other

## 2016-09-01 NOTE — Progress Notes (Signed)
Physical Therapy Note  Patient Details  Name: [name] MRN: [id number] Date of Birth: 1927-10-05 Today's Date: 09/01/2016    Pt missed 75 min of skilled PT session due to refusal. Pt and daughter report pt having a coughing spell for the last hour that has worn him out and he is too exhausted to participate. Declined with several attempts and when PT suggested trying to come back later, pt stated he wouldn't be able to do anything today. Will follow up as schedule allows and per patient willingness to participate.   Medical interpreter was present.   Canary Brim Ivory Broad, PT, DPT  09/01/2016, 1:20 PM

## 2016-09-01 NOTE — Progress Notes (Signed)
Physical Therapy Session Note  Patient Details  Name: [name] MRN: [id number] Date of Birth: 07-20-1927  Today's Date: 09/01/2016 PT Individual Time: 0900-0955 PT Individual Time Calculation (min): 55 min   Short Term Goals: Week 1:  PT Short Term Goal 1 (Week 1): Pt will negotiate 3 steps without rails with min assist. PT Short Term Goal 2 (Week 1): Pt will ambulate 150 ft with LRAD & supervision. PT Short Term Goal 3 (Week 1): Pt will complete all transfers with supervision and LRAD.  Skilled Therapeutic Interventions/Progress Updates:    Pt completed bed mobility during session (supine <> sit) with overall supervision to recall need for log roll technique but then able to demonstrate correctly. Total assist to don TLSO EOB and cues to recall importance of donning it prior to standing. Pt practiced doffing TLSO at end of session with recommendation to leave one side fastened to make it easier to don independently next time (recommended to practice next session). Gait with RW down to therapy gym with supervision - narrow BOS noted and shuffled gait pattern. Progressed session to focus on neuro re-ed to address functional balance, balance reactions, postural control, and weightshifting during gait activities without AD, functional reach/squat task while on compliant foam surface with emphasis on maintaining back precautions in functional activity with min assist and no UE support, stair negotiation without rails with min assist due to decreased eccentric control while descending and decreased strength and balance ascending, alternating toe taps on 6" step, obstacle course (navigating tight turns, stepping over simulated thresholds, gait on compliant surface, and up/down 6" curb step) without AD to increase challenge of balance. Administered DGI to assess fall risk (see results in Doc Flowsheets) with score of 7 indicating fall risk. Pt requires close supervision to min assist depending on level of  difficulty with task. Cues throughout for adherence to back precautions functionally. Ended session 5 min early due to fatigue and pt with scheduled OT right at 10:00. Discussed LOS and recommendations for supervision (especially for higher level decision making) with CSW with daughter potentially coming in during next OT session - discussed with OT as well.    Medical interpreter present during session.   Therapy Documentation Precautions:  Precautions Precautions: Fall, Back Precaution Comments: Pt accustomed to being independent so needs chair alarm when up.  Not safe to get up alone Required Braces or Orthoses: Spinal Brace Spinal Brace: Thoracolumbosacral orthotic, Applied in sitting position Restrictions Weight Bearing Restrictions: No General: PT Amount of Missed Time (min): 5 Minutes PT Missed Treatment Reason: Patient fatigue Pain: Pain Assessment Pain Assessment: 0-10 Pain Score: 0-No pain    See Function Navigator for Current Functional Status.   Therapy/Group: Individual Therapy  Canary Brim Ivory Broad, PT, DPT  09/01/2016, 10:11 AM

## 2016-09-01 NOTE — Progress Notes (Signed)
Occupational Therapy Session Note  Patient Details  Name: [name] MRN: [id number] Date of Birth: 11-18-1927  Today's Date: 09/01/2016 OT Individual Time: 1000-1040 OT Individual Time Calculation (min): 40 min    Short Term Goals: Week 1:  OT Short Term Goal 1 (Week 1): pt will sit to stand with supervision in prep for clothing management OT Short Term Goal 2 (Week 1): Pt will groom in standing for 2 grooming items to improve functional endurance OT Short Term Goal 3 (Week 1): Pt will complete tub transfer with supervision and LRAD OT Short Term Goal 4 (Week 1): Pt will complete LB dressing with supervision and AE prn  Skilled Therapeutic Interventions/Progress Updates:    Pt seen supine in bed asleep, but is easily aroused. Focus of session on donning/doffing TLSO, ADL retraining and d/c planning. Interpreter present. No c/o pain, however reports feeling extremely fatigued. Pt decline opportunity to shower d/t PT session later in the day. Pt able to recall all 3 back precautions when asked. Pt supine>EOB with touching A and VC for log rolling to maintain back precautions. Pt sit EOB and attempts to stands without brace on. Pt educated on importance of brace. Pt doff/don shirt/pants with supervision and VC to maintain back bending precautions. Pt practices don/doff brace 2x with A for top strap. Daughter arrives during session and brings pt food. Pt educated on importance of eating to have energy throughout the day and participate in therapy. Educated pt and daughter on need for supervision, importance of orthotic device, and answered questions for d/c planning. Pt report too fatigued to continue therapy, and lies down. Exited session with pt asleep in bed with call light in reach and daughter present.   Therapy Documentation Precautions:  Precautions Precautions: Fall, Back Precaution Comments: Pt accustomed to being independent so needs chair alarm when up.  Not safe to get up  alone Required Braces or Orthoses: Spinal Brace Spinal Brace: Thoracolumbosacral orthotic, Applied in sitting position Restrictions Weight Bearing Restrictions: No General: General PT Missed Treatment Reason: Patient fatigue Vital Signs:  Pain: Pain Assessment Pain Assessment: No/denies pain Pain Score: 0-No pain ADL:   Exercises:   Other Treatments:    See Function Navigator for Current Functional Status.   Therapy/Group: Individual Therapy  Tonny Branch 09/01/2016, 12:31 PM

## 2016-09-01 NOTE — Progress Notes (Signed)
[place] [facility] MEDICINE & REHABILITATION     PROGRESS NOTE    Subjective/Complaints: No new issues. Pain under control  ROS: pt denies nausea, vomiting, diarrhea, cough, shortness of breath or chest pain     Objective: Vital Signs: Blood pressure (!) 111/59, pulse 80, temperature (!) 88.3 F (31.3 C), temperature source Oral, resp. rate 18, height 5\' 6"  (1.676 m), weight 71.2 kg (156 lb 15.5 oz), SpO2 98 %. (TEMP NOT VALID) No results found. No results for input(s): WBC, HGB, HCT, PLT in the last 72 hours. No results for input(s): NA, K, CL, GLUCOSE, BUN, CREATININE, CALCIUM in the last 72 hours.  Invalid input(s): CO CBG (last 3)  No results for input(s): GLUCAP in the last 72 hours.  Wt Readings from Last 3 Encounters:  09/01/16 71.2 kg (156 lb 15.5 oz)  08/28/16 75 kg (165 lb 6.4 oz)  05/05/15 77.1 kg (170 lb)    Physical Exam:  Constitutional. He appears well-developed. Mood and behavior appear normal Obese HEENT.oral mucosa with decreased white debris/coating.  Head normocephalic Eyes pupils round and reactive to light no discharge Neck. Supple nontender no JVD CardiacRRR Chest- CTA B GI. Abdomen mildly distended nontender he does have bowel sounds Musculoskeletal. He exhibits no edema. Low and mid back tender to palpation Skin. Warm and dry.  Neurological: He is alert.   Motor: B/LUE, RLE 5/5 proximal to distal LLE: HF and KE4/5, distally 5/5--no changes Sensation appears to be intact to light touchin all 4 limbs No obvious cognitive issues. Nods "yes" frequently although he doesn't understand Psych: pleasant and cooperative  Assessment/Plan: 1. Functional and mobility deficits secondary to polytrauma which require 3+ hours per day of interdisciplinary therapy in a comprehensive inpatient rehab setting. Physiatrist is providing close team supervision and 24 hour management of active medical problems listed below. Physiatrist and rehab team continue  to assess barriers to discharge/monitor patient progress toward functional and medical goals.  Function:  Bathing Bathing position   Position: Shower  Bathing parts Body parts bathed by patient: Right arm, Left arm, Chest, Abdomen, Front perineal area, Buttocks, Right upper leg, Left upper leg, Left lower leg, Back, Right lower leg Body parts bathed by helper: Right lower leg, Back  Bathing assist Assist Level: Supervision or verbal cues      Upper Body Dressing/Undressing Upper body dressing   What is the patient wearing?: Pull over shirt/dress, Orthosis     Pull over shirt/dress - Perfomed by patient: Thread/unthread right sleeve, Thread/unthread left sleeve, Put head through opening, Pull shirt over trunk Pull over shirt/dress - Perfomed by helper: Pull shirt over trunk        Upper body assist Assist Level: Set up, Supervision or verbal cues   Set up : To apply TLSO, cervical collar (tot A to don TLSO)  Lower Body Dressing/Undressing Lower body dressing   What is the patient wearing?: Ted Hose Underwear - Performed by patient: Thread/unthread right underwear leg, Thread/unthread left underwear leg, Pull underwear up/down   Pants- Performed by patient: Thread/unthread right pants leg, Thread/unthread left pants leg, Pull pants up/down       Socks - Performed by patient: Don/doff right sock, Don/doff left sock   Shoes - Performed by patient: Don/doff right shoe, Don/doff left shoe Shoes - Performed by helper: Don/doff right shoe, Don/doff left shoe       TED Hose - Performed by helper: Don/doff right TED hose, Don/doff left TED hose  Lower body assist Assist for lower  body dressing: Touching or steadying assistance (Pt > 75%)      Toileting Toileting Toileting activity did not occur: No continent bowel/bladder event Toileting steps completed by patient: Performs perineal hygiene, Adjust clothing prior to toileting, Adjust clothing after toileting   Toileting  Assistive Devices: Grab bar or rail  Toileting assist Assist level: More than reasonable time   Transfers Chair/bed transfer Chair/bed transfer activity did not occur: Safety/medical concerns Chair/bed transfer method: Stand pivot Chair/bed transfer assist level: Touching or steadying assistance (Pt > 75%) Chair/bed transfer assistive device: Armrests     Locomotion Ambulation     Max distance: 150 ft Assist level: Supervision or verbal cues   Wheelchair Wheelchair activity did not occur: Safety/medical concerns   Max wheelchair distance: 100 Assist Level: Supervision or verbal cues  Cognition Comprehension Comprehension assist level: Understands basic 90% of the time/cues < 10% of the time  Expression Expression assist level: Expresses basic 90% of the time/requires cueing < 10% of the time.  Social Interaction Social Interaction assist level: Interacts appropriately with others with medication or extra time (anti-anxiety, antidepressant).  Problem Solving Problem solving assist level: Solves basic 90% of the time/requires cueing < 10% of the time  Memory Memory assist level: Recognizes or recalls 90% of the time/requires cueing < 10% of the time   Medical Problem List and Plan: 1. Polytrauma, right pneumothorax with right chest wall subcutaneous emphysema, right costochondral fractures, small anterior mediastinal hematoma, pneumoperitoneum as well as by vertebral fractures of T10, T11 -TLSO secondary to fall from a tree cutting branches -continue CIR therapies 2. DVT Prophylaxis/Anticoagulation: Subcutaneous Lovenox 40 mg daily. Check vascular study on admit 3. Pain Management: Flexeril 5 mg 3 times a day, Ultram 50 mg every 6 hours -fair control at present. Encourage pt to use pain medication when needed -TLSO when OOB 4. Mood: Lexapro 20 mg daily 5. Neuropsych: This patient iscapable of making decisions on hisown behalf. 6. Skin/Wound  Care: Routine skin checks 7. Fluids/Electrolytes/Nutrition: Routine I&O with follow-up chemistries personallly reviewed today -encourage PO 8.Blood loss anemia. Follow-up CBC shows improvement  (11.2)  9.Ileus. Tolerating diet 10.Hypertension. Avapro 75 mg daily, HCTZ 12.5 mg daily 11.Hyperlipidemia. Zocor 12.Constipation. Laxative assistance 13. Sore throat---improved  -dc diflucan, continue nystatin mouth/throat wash  -chloraseptic spray   LOS (Days) 4 A FACE TO FACE EVALUATION WAS PERFORMED  Meredith Staggers, MD 09/01/2016 8:59 AM

## 2016-09-01 NOTE — Progress Notes (Signed)
Occupational Therapy Session Note  Patient Details  Name: [name] MRN: [id number] Date of Birth: [birth year]  Today's Date: 09/01/2016 OT Missed Time:  20 min Missed Time Reason:     Pt participate in dressing activity however reporting too fatigued to continue with rest of therapy. Educated pt on importance of participating and pt verbalizes understanding.   Short Term Goals: Week 1:  OT Short Term Goal 1 (Week 1): pt will sit to stand with supervision in prep for clothing management OT Short Term Goal 2 (Week 1): Pt will groom in standing for 2 grooming items to improve functional endurance OT Short Term Goal 3 (Week 1): Pt will complete tub transfer with supervision and LRAD OT Short Term Goal 4 (Week 1): Pt will complete LB dressing with supervision and AE prn  Skilled Therapeutic Interventions/Progress Updates:      Therapy Documentation Precautions:  Precautions Precautions: Fall, Back Precaution Comments: Pt accustomed to being independent so needs chair alarm when up.  Not safe to get up alone Required Braces or Orthoses: Spinal Brace Spinal Brace: Thoracolumbosacral orthotic, Applied in sitting position Restrictions Weight Bearing Restrictions: No General: General PT Missed Treatment Reason: Patient fatigue Vital Signs:  Pain: Pain Assessment Pain Assessment: No/denies pain Pain Score: 0-No pain ADL:   Exercises:   Other Treatments:    See Function Navigator for Current Functional Status.   Therapy/Group: Individual Therapy  Tonny Branch 09/01/2016, 12:29 PM

## 2016-09-02 ENCOUNTER — Inpatient Hospital Stay (HOSPITAL_COMMUNITY): Payer: Medicare Other

## 2016-09-02 LAB — GLUCOSE, CAPILLARY: GLUCOSE-CAPILLARY: 123 mg/dL — AB (ref 65–99)

## 2016-09-02 NOTE — Progress Notes (Signed)
Occupational Therapy Session Note  Patient Details  Name: [name] MRN: [id number] Date of Birth: [birth year]  Today's Date: 09/02/2016 OT Individual Time: 0900-1000 OT Individual Time Calculation (min): 60 min    Short Term Goals: Week 1:  OT Short Term Goal 1 (Week 1): pt will sit to stand with supervision in prep for clothing management OT Short Term Goal 2 (Week 1): Pt will groom in standing for 2 grooming items to improve functional endurance OT Short Term Goal 3 (Week 1): Pt will complete tub transfer with supervision and LRAD OT Short Term Goal 4 (Week 1): Pt will complete LB dressing with supervision and AE prn  Skilled Therapeutic Interventions/Progress Updates:    Pt resting in bed upon arrival.  Pt stated he wanted to shower during his later OT session.  Pt amb with RW to therapy gym and initially engaged in Nustep (workload 4 X 6 mins X 2).  Pt transitioned to dynamic standing tasks tossing ball against mini trampoline on compliant and non compliant surface with steady A.  Pt amb without AD into ADL apartment and practiced simple home mgmt tasks without AD (steady A).  Pt returned to room (amb without AD) and returned to bed to rest.  All needs within reach and bed alarm activated.   Therapy Documentation Precautions:  Precautions Precautions: Fall, Back Precaution Comments: Pt accustomed to being independent so needs chair alarm when up.  Not safe to get up alone Required Braces or Orthoses: Spinal Brace Spinal Brace: Thoracolumbosacral orthotic, Applied in sitting position Restrictions Weight Bearing Restrictions: No Pain:  Pt denied pain  See Function Navigator for Current Functional Status.   Therapy/Group: Individual Therapy  Leroy Libman 09/02/2016, 3:00 PM

## 2016-09-02 NOTE — Progress Notes (Signed)
Occupational Therapy Note  Patient Details  Name: [name] MRN: [id number] Date of Birth: January 28, 1928  Today's Date: 09/02/2016 OT Missed Time: 2 Minutes Missed Time Reason: Pain  Pt missed 60 mins skilled OT services secondary to R lower back pain.  RN aware, meds administered prior to therapy, and Kpad ordered.    [name] [facility] 09/02/2016, 2:58 PM

## 2016-09-02 NOTE — Progress Notes (Signed)
Patient has refused the nystatin mouthwash since yesterday morning. His daughter again expressed their desire that this not be given due to the episodes of coughing that follow its administration.

## 2016-09-02 NOTE — Progress Notes (Signed)
[place] PHYSICAL MEDICINE & REHABILITATION     PROGRESS NOTE    Subjective/Complaints: Pt missed therapy time yesterday afternoon due to fatigue. States he feels well this morning. Has moderate back pain  ROS: pt denies nausea, vomiting, diarrhea, cough, shortness of breath or chest pain     Objective: Vital Signs: Blood pressure (!) 124/57, pulse 70, temperature 98.5 F (36.9 C), temperature source Oral, resp. rate 17, height 5\' 6"  (1.676 m), weight 70.1 kg (154 lb 9.6 oz), SpO2 94 %. (TEMP NOT VALID) No results found. No results for input(s): WBC, HGB, HCT, PLT in the last 72 hours. No results for input(s): NA, K, CL, GLUCOSE, BUN, CREATININE, CALCIUM in the last 72 hours.  Invalid input(s): CO CBG (last 3)  No results for input(s): GLUCAP in the last 72 hours.  Wt Readings from Last 3 Encounters:  09/02/16 70.1 kg (154 lb 9.6 oz)  08/28/16 75 kg (165 lb 6.4 oz)  05/05/15 77.1 kg (170 lb)    Physical Exam:  Constitutional. He appears well-developed. Mood and behavior appear normal Obese HEENT.oral mucosa pink and moist.  Head normocephalic Eyes pupils round and reactive to light no discharge Neck. No JVD Cardiac: RRR Chest- CTA B. Tender to palpation laterally GI. BS+, soft NT Musculoskeletal. He exhibits no edema. Low and mid back tender to palpation Skin. Warm and dry.  Neurological: He is alert.   Motor: B/LUE, RLE 5/5 unchanged LLE: HF and KE4/5, distally 5/5--unchanged Sensation appears to be intact to light touchin all 4 limbs No obvious cognitive issues. Nods "yes" frequently although he doesn't understand Psych: pleasant and cooperative  Assessment/Plan: 1. Functional and mobility deficits secondary to polytrauma which require 3+ hours per day of interdisciplinary therapy in a comprehensive inpatient rehab setting. Physiatrist is providing close team supervision and 24 hour management of active medical problems listed below. Physiatrist and rehab  team continue to assess barriers to discharge/monitor patient progress toward functional and medical goals.  Function:  Bathing Bathing position   Position: Shower  Bathing parts Body parts bathed by patient: Right arm, Left arm, Chest, Abdomen, Front perineal area, Buttocks, Right upper leg, Left upper leg, Left lower leg, Back, Right lower leg Body parts bathed by helper: Right lower leg, Back  Bathing assist Assist Level: Supervision or verbal cues      Upper Body Dressing/Undressing Upper body dressing   What is the patient wearing?: Pull over shirt/dress, Orthosis     Pull over shirt/dress - Perfomed by patient: Thread/unthread right sleeve, Thread/unthread left sleeve, Put head through opening, Pull shirt over trunk Pull over shirt/dress - Perfomed by helper: Pull shirt over trunk        Upper body assist Assist Level: Set up, Supervision or verbal cues   Set up : To apply TLSO, cervical collar (tot A to don TLSO)  Lower Body Dressing/Undressing Lower body dressing   What is the patient wearing?: Ted Hose Underwear - Performed by patient: Thread/unthread right underwear leg, Thread/unthread left underwear leg, Pull underwear up/down   Pants- Performed by patient: Thread/unthread right pants leg, Thread/unthread left pants leg, Pull pants up/down       Socks - Performed by patient: Don/doff right sock, Don/doff left sock   Shoes - Performed by patient: Don/doff right shoe, Don/doff left shoe Shoes - Performed by helper: Don/doff right shoe, Don/doff left shoe       TED Hose - Performed by helper: Don/doff right TED hose, Don/doff left TED hose  Lower  body assist Assist for lower body dressing: Touching or steadying assistance (Pt > 75%)      Toileting Toileting Toileting activity did not occur: No continent bowel/bladder event Toileting steps completed by patient: Performs perineal hygiene, Adjust clothing prior to toileting, Adjust clothing after toileting    Toileting Assistive Devices: Grab bar or rail  Toileting assist Assist level: More than reasonable time   Transfers Chair/bed transfer Chair/bed transfer activity did not occur: Safety/medical concerns Chair/bed transfer method: Ambulatory Chair/bed transfer assist level: Touching or steadying assistance (Pt > 75%) Chair/bed transfer assistive device: Orthosis     Locomotion Ambulation     Max distance: 200' Assist level: Touching or steadying assistance (Pt > 75%)   Wheelchair Wheelchair activity did not occur: Safety/medical concerns   Max wheelchair distance: 100 Assist Level: Supervision or verbal cues  Cognition Comprehension Comprehension assist level: Understands basic 90% of the time/cues < 10% of the time  Expression Expression assist level: Expresses basic 90% of the time/requires cueing < 10% of the time.  Social Interaction Social Interaction assist level: Interacts appropriately with others with medication or extra time (anti-anxiety, antidepressant).  Problem Solving Problem solving assist level: Solves basic 90% of the time/requires cueing < 10% of the time  Memory Memory assist level: Recognizes or recalls 90% of the time/requires cueing < 10% of the time   Medical Problem List and Plan: 1. Polytrauma, right pneumothorax with right chest wall subcutaneous emphysema, right costochondral fractures, small anterior mediastinal hematoma, pneumoperitoneum as well as by vertebral fractures of T10, T11 -TLSO secondary to fall from a tree cutting branches -continue CIR therapies. Pace/spread therapies as possible 2. DVT Prophylaxis/Anticoagulation: Subcutaneous Lovenox 40 mg daily. Check vascular study on admit 3. Pain Management: Flexeril 5 mg 3 times a day, Ultram 50 mg every 6 hours -fair control at present.  -TLSO when OOB 4. Mood: Lexapro 20 mg daily 5. Neuropsych: This patient iscapable of making decisions on hisown behalf. 6.  Skin/Wound Care: Routine skin checks 7. Fluids/Electrolytes/Nutrition: Routine I&O with follow-up chemistries personallly reviewed today -encourage PO 8.Blood loss anemia. Follow-up CBC shows improvement  (11.2)  9.Ileus. Tolerating diet 10.Hypertension. Avapro 75 mg daily, HCTZ 12.5 mg daily 11.Hyperlipidemia. Zocor 12.Constipation. Laxative assistance 13. Sore throat---improved  -dc diflucan, continue nystatin mouth/throat wash  -chloraseptic spray prn   LOS (Days) 5 A FACE TO FACE EVALUATION WAS PERFORMED  Meredith Staggers, MD 09/02/2016 9:34 AM

## 2016-09-02 NOTE — Progress Notes (Signed)
Patient began coughing after taking his nystatin mouthwash. The cough was unproductive and continued for approximately 2 hours, ceasing only after the PRN breathing treatment was administered. No other medicine appeared to cause any problem, he used magic mouthwash and swallowed his morning meds without difficulty. The same reaction happened with his 1200 dose of nystatin and at this time his daughter requested that we not give nystatin mouthwash again.

## 2016-09-02 NOTE — Progress Notes (Signed)
Physical Therapy Session Note  Patient Details  Name: [name] MRN: [id number] Date of Birth: 1927-08-14  Today's Date: 09/02/2016 PT Individual Time: 1045-1200 PT Individual Time Calculation (min): 75 min   Short Term Goals: Week 1:  PT Short Term Goal 1 (Week 1): Pt will negotiate 3 steps without rails with min assist. PT Short Term Goal 2 (Week 1): Pt will ambulate 150 ft with LRAD & supervision. PT Short Term Goal 3 (Week 1): Pt will complete all transfers with supervision and LRAD.  Skilled Therapeutic Interventions/Progress Updates:   Bed mobility mod I in hospital bed and ADL apartment bed with extra time - did not requires to adhere to back precautions either time for both supine <> sit. Required total assist for donning brace - allowed patient to attempt to don independently but unable to problem solve or get it lined up correctly. Gait on unit without AD to challenge balance with supervision to steadying assist and min assist for balance during turns. Simulated car transfer and gait/transfers without AD observed by daughter. Practiced gait up/down ramp and over mulched surface for community mobility training with steadying assist. Stair negotiation training up/down 12 steps without rails for blocked practice with close supervision to steady assist demonstrating improved control today compared to yesterday's session. Trial with SPC for stair negotiation (improved overall and approaching supervision without rails) and then trial with SPC through obstacle course and on unit to simulate community mobility navigating obstacles and uneven surfaces. pt demonstrates increased ability to regain balance with less physical assist by PT. Recommend continued practice and discussed this with both patient and daughter that this is my recommendation at this time for an AD. Focused on d/c planning and education with pt's daughter throughout the session when she was present reviewing recommendation for use  of SPC and discussed at length at end of session patient's progress, goals, safety recommendations, and DME. Pt very fatigued at end of session and returned back to bed the last 15 min that education was occurring.   Therapy Documentation Precautions:  Precautions Precautions: Fall, Back Precaution Comments: Pt accustomed to being independent so needs chair alarm when up.  Not safe to get up alone Required Braces or Orthoses: Spinal Brace Spinal Brace: Thoracolumbosacral orthotic, Applied in sitting position Restrictions Weight Bearing Restrictions: No  Pain: Reports pain in low back with breathing - RN aware.   See Function Navigator for Current Functional Status.   Therapy/Group: Individual Therapy  Canary Brim Ivory Broad, PT, DPT  09/02/2016, 12:15 PM

## 2016-09-03 ENCOUNTER — Inpatient Hospital Stay (HOSPITAL_COMMUNITY): Payer: Medicare Other

## 2016-09-03 NOTE — Progress Notes (Signed)
[facility] PHYSICAL MEDICINE & REHABILITATION     PROGRESS NOTE    Subjective/Complaints: No complaints. Slept well.   ROS: pt denies nausea, vomiting, diarrhea, cough, shortness of breath or chest pain     Objective: Vital Signs: Blood pressure 112/61, pulse 71, temperature 98 F (36.7 C), temperature source Oral, resp. rate 18, height 5\' 6"  (1.676 m), weight 70.6 kg (155 lb 9.6 oz), SpO2 98 %. (TEMP NOT VALID) No results found. No results for input(s): WBC, HGB, HCT, PLT in the last 72 hours. No results for input(s): NA, K, CL, GLUCOSE, BUN, CREATININE, CALCIUM in the last 72 hours.  Invalid input(s): CO CBG (last 3)   Recent Labs  09/02/16 1147  GLUCAP 123*    Wt Readings from Last 3 Encounters:  09/03/16 70.6 kg (155 lb 9.6 oz)  08/28/16 75 kg (165 lb 6.4 oz)  05/05/15 77.1 kg (170 lb)    Physical Exam:  Constitutional. He appears well-developed. Mood and behavior appear normal Obese HEENT.oral mucosa pink and moist.  Head normocephalic Eyes pupils round and reactive to light no discharge Neck. No JVD Cardiac: RRR Chest- normal effort. CTA B GI. BS+, soft NT Musculoskeletal. He exhibits no edema. Low and mid back tender to palpation Skin. Warm and dry.  Neurological: He is alert.   Motor: B/LUE, RLE 5/5 unchanged LLE: HF and KE4/5, distally 5/5--unchanged Sensation appears to be intact to light touchin all 4 limbs No obvious cognitive issues. Nods "yes" frequently although he doesn't understand Psych: pleasant and cooperative  Assessment/Plan: 1. Functional and mobility deficits secondary to polytrauma which require 3+ hours per day of interdisciplinary therapy in a comprehensive inpatient rehab setting. Physiatrist is providing close team supervision and 24 hour management of active medical problems listed below. Physiatrist and rehab team continue to assess barriers to discharge/monitor patient progress toward functional and medical  goals.  Function:  Bathing Bathing position   Position: Shower  Bathing parts Body parts bathed by patient: Right arm, Left arm, Chest, Abdomen, Front perineal area, Buttocks, Right upper leg, Left upper leg, Left lower leg, Back, Right lower leg Body parts bathed by helper: Right lower leg, Back  Bathing assist Assist Level: Supervision or verbal cues      Upper Body Dressing/Undressing Upper body dressing   What is the patient wearing?: Pull over shirt/dress, Orthosis     Pull over shirt/dress - Perfomed by patient: Thread/unthread right sleeve, Thread/unthread left sleeve, Put head through opening, Pull shirt over trunk Pull over shirt/dress - Perfomed by helper: Pull shirt over trunk        Upper body assist Assist Level: Set up, Supervision or verbal cues   Set up : To apply TLSO, cervical collar (tot A to don TLSO)  Lower Body Dressing/Undressing Lower body dressing   What is the patient wearing?: Ted Hose Underwear - Performed by patient: Thread/unthread right underwear leg, Thread/unthread left underwear leg, Pull underwear up/down   Pants- Performed by patient: Thread/unthread right pants leg, Thread/unthread left pants leg, Pull pants up/down       Socks - Performed by patient: Don/doff right sock, Don/doff left sock   Shoes - Performed by patient: Don/doff right shoe, Don/doff left shoe Shoes - Performed by helper: Don/doff right shoe, Don/doff left shoe       TED Hose - Performed by helper: Don/doff right TED hose, Don/doff left TED hose  Lower body assist Assist for lower body dressing: Touching or steadying assistance (Pt > 75%)  Toileting Toileting Toileting activity did not occur: No continent bowel/bladder event Toileting steps completed by patient: Adjust clothing prior to toileting, Performs perineal hygiene, Adjust clothing after toileting   Toileting Assistive Devices: Grab bar or rail  Toileting assist Assist level: Touching or steadying  assistance (Pt.75%)   Transfers Chair/bed transfer Chair/bed transfer activity did not occur: Safety/medical concerns Chair/bed transfer method: Ambulatory Chair/bed transfer assist level: Touching or steadying assistance (Pt > 75%) Chair/bed transfer assistive device: Orthosis     Locomotion Ambulation     Max distance: 200' Assist level: Touching or steadying assistance (Pt > 75%)   Wheelchair Wheelchair activity did not occur: Safety/medical concerns   Max wheelchair distance: 100 Assist Level: Supervision or verbal cues  Cognition Comprehension Comprehension assist level: Understands basic 90% of the time/cues < 10% of the time  Expression Expression assist level: Expresses basic 90% of the time/requires cueing < 10% of the time.  Social Interaction Social Interaction assist level: Interacts appropriately with others with medication or extra time (anti-anxiety, antidepressant).  Problem Solving Problem solving assist level: Solves basic 90% of the time/requires cueing < 10% of the time  Memory Memory assist level: Recognizes or recalls 90% of the time/requires cueing < 10% of the time   Medical Problem List and Plan: 1. Polytrauma, right pneumothorax with right chest wall subcutaneous emphysema, right costochondral fractures, small anterior mediastinal hematoma, pneumoperitoneum as well as by vertebral fractures of T10, T11 -TLSO secondary to fall from a tree cutting branches -continue CIR therapies. Activity tolerance an issue at times 2. DVT Prophylaxis/Anticoagulation: Subcutaneous Lovenox 40 mg daily. Check vascular study on admit 3. Pain Management: Flexeril 5 mg 3 times a day, Ultram 50 mg every 6 hours -fair control in general  -TLSO when OOB 4. Mood: Lexapro 20 mg daily 5. Neuropsych: This patient iscapable of making decisions on hisown behalf. 6. Skin/Wound Care: Routine skin checks 7. Fluids/Electrolytes/Nutrition: Routine I&O  with follow-up chemistries personallly reviewed today -encourage PO 8.Blood loss anemia. Follow-up CBC shows improvement  (11.2)  9.Ileus. Tolerating diet 10.Hypertension. Avapro 75 mg daily, HCTZ 12.5 mg daily 11.Hyperlipidemia. Zocor 12.Constipation. Laxative assistance 13. Sore throat---improved  -  continue nystatin mouth/throat wash  -chloraseptic spray prn   LOS (Days) 6 A FACE TO FACE EVALUATION WAS PERFORMED  Meredith Staggers, MD 09/03/2016 8:48 AM

## 2016-09-03 NOTE — Progress Notes (Signed)
Occupational Therapy Session Note  Patient Details  Name: [name] MRN: [id number] Date of Birth: Mar 17, 1928  Today's Date: 09/03/2016 OT Individual Time: 8657-8469 OT Individual Time Calculation (min): 58 min    Short Term Goals: Week 1:  OT Short Term Goal 1 (Week 1): pt will sit to stand with supervision in prep for clothing management OT Short Term Goal 2 (Week 1): Pt will groom in standing for 2 grooming items to improve functional endurance OT Short Term Goal 3 (Week 1): Pt will complete tub transfer with supervision and LRAD OT Short Term Goal 4 (Week 1): Pt will complete LB dressing with supervision and AE prn  Skilled Therapeutic Interventions/Progress Updates:    1:1. Focus on education on importance of orthotic donning compliance and ADL retraining. Interpreter present. Pt supine in bed requesting to take a shower but refusing to don brace because, "It hurts me, it makes my back hurt and it is too big." Spent significant amount of time educating pt on importance of brace while standing and walking for spine stability to prevent injury to vertebrae/spinal cord. Educated pt on severity of potential paralysis if brace is not worn during standing and walking per MD orders. Pt continues to refuse to wear TLSO to walk into shower. Pt educated on lateral scoot transfer technique to eliminate standing to be able to transfer EOB<>w/c<>TTB in shower with MAX A for lifting and lowering to decrease stress on spine with VC to maintain back precautions during transfer and hand placement throughout transfer. Pt bathes seated on TTB with VC to use long handled sponge to reach toes and back. Pt dons pull over t-shirt with supervision while seated EOB, and requests to only don underwear, pt threads B feet into pant leg using reacher and log rolls bilaterally with VC for technique as OT advances pants past hips. Pt dons B socks with sock aide with VC for technique seated EOB. Pt requires frequent rest  breaks in supine during dressing. Exited session with pt supine in bed with call light in reach and bed exit alarm on.   Therapy Documentation Precautions:  Precautions Precautions: Fall, Back Precaution Comments: Pt accustomed to being independent so needs chair alarm when up.  Not safe to get up alone Required Braces or Orthoses: Spinal Brace Spinal Brace: Thoracolumbosacral orthotic, Applied in sitting position Restrictions Weight Bearing Restrictions: No  See Function Navigator for Current Functional Status.   Therapy/Group: Individual Therapy  Tonny Branch 09/03/2016, 6:22 PM

## 2016-09-04 ENCOUNTER — Inpatient Hospital Stay (HOSPITAL_COMMUNITY): Payer: Medicare Other | Admitting: Physical Therapy

## 2016-09-04 NOTE — Progress Notes (Signed)
Physical Therapy Session Note  Patient Details  Name: [name] MRN: [id number] Date of Birth: [birth year]  Today's Date: 09/04/2016 PT Individual Time: 0900-1000 PT Individual Time Calculation (min): 60 min   Short Term Goals: Week 1:  PT Short Term Goal 1 (Week 1): Pt will negotiate 3 steps without rails with min assist. PT Short Term Goal 2 (Week 1): Pt will ambulate 150 ft with LRAD & supervision. PT Short Term Goal 3 (Week 1): Pt will complete all transfers with supervision and LRAD.  Skilled Therapeutic Interventions/Progress Updates:  Pt received in bed reporting R rib & back pain but declined therapist requesting pain meds from RN. Pt transferred supine>sitting with very minimal cuing for log rolling. Once sitting at EOB pt observed to be incontinent of bowels. Pt required max assist for donning TLSO then in standing pt performed perihygiene with steady assist for balance. Pt able to don clean underwear, shorts, and socks with use of AE (reacher & sock aide) with min cuing for use of devices. Pt also required cuing to sit when threading underwear & pants to prevent LOB in standing. Educated pt on need to wear TLSO at all times when OOB at home & pt voiced understanding. Pt has personal cane in room and therapist adjusted it to appropriate height. Pt ambulated room<>gym with SPC & close supervision<>steady assist 2/2 reduced balance initially. In gym pt requesting exercises to complete at home. Provided pt with OTAGO level A exercises (except stair walking) and pt return demonstrated all exercises; pt required cuing for proper technique throughout exercises. Educated pt on recommendation of f/u HHPT after d/c. At end of session pt left in w/c with QRB donned in room with all needs within reach.  Professional interpreter present for session.   Therapy Documentation Precautions:  Precautions Precautions: Fall, Back Required Braces or Orthoses: Spinal Brace Spinal Brace:  Thoracolumbosacral orthotic, Applied in sitting position Restrictions Weight Bearing Restrictions: No   See Function Navigator for Current Functional Status.   Therapy/Group: Individual Therapy  [name] 09/04/2016, 10:30 AM

## 2016-09-04 NOTE — Progress Notes (Signed)
[facility] PHYSICAL MEDICINE & REHABILITATION     PROGRESS NOTE    Subjective/Complaints: Pt lying comfortably in bed. Back/flank still tender  ROS: Limited due to language    Objective: Vital Signs: Blood pressure (!) 118/55, pulse 67, temperature 97.7 F (36.5 C), temperature source Oral, resp. rate 18, height 5\' 6"  (1.676 m), weight 70.4 kg (155 lb 4.8 oz), SpO2 97 %. (TEMP NOT VALID) No results found. No results for input(s): WBC, HGB, HCT, PLT in the last 72 hours. No results for input(s): NA, K, CL, GLUCOSE, BUN, CREATININE, CALCIUM in the last 72 hours.  Invalid input(s): CO CBG (last 3)   Recent Labs  09/02/16 1147  GLUCAP 123*    Wt Readings from Last 3 Encounters:  09/04/16 70.4 kg (155 lb 4.8 oz)  08/28/16 75 kg (165 lb 6.4 oz)  05/05/15 77.1 kg (170 lb)    Physical Exam:  Constitutional. He appears well-developed. Mood and behavior appear normal Obese HEENT.oral mucosa pink and moist.  Head normocephalic Eyes pupils round and reactive to light no discharge Neck. No JVD Cardiac: RRR with Sys murmur Chest- CTA B. Normal effort GI. BS+, soft NT Musculoskeletal. He exhibits no edema. Low and mid back/flank tender to palpation Skin. Warm and dry.  Neurological: He is alert.   Motor: B/LUE, RLE 5/5 unchanged LLE: HF and KE4/5, distally 5/5--stable Sensation appears to be intact to light touchin all 4 limbs Cognitively intact Psych: pleasant and cooperative  Assessment/Plan: 1. Functional and mobility deficits secondary to polytrauma which require 3+ hours per day of interdisciplinary therapy in a comprehensive inpatient rehab setting. Physiatrist is providing close team supervision and 24 hour management of active medical problems listed below. Physiatrist and rehab team continue to assess barriers to discharge/monitor patient progress toward functional and medical goals.  Function:  Bathing Bathing position   Position: Shower  Bathing parts  Body parts bathed by patient: Right arm, Left arm, Chest, Abdomen, Front perineal area, Buttocks, Right upper leg, Left upper leg, Left lower leg, Back, Right lower leg Body parts bathed by helper: Right lower leg, Back  Bathing assist Assist Level: Supervision or verbal cues      Upper Body Dressing/Undressing Upper body dressing   What is the patient wearing?: Pull over shirt/dress     Pull over shirt/dress - Perfomed by patient: Thread/unthread right sleeve, Thread/unthread left sleeve, Put head through opening, Pull shirt over trunk Pull over shirt/dress - Perfomed by helper: Pull shirt over trunk        Upper body assist Assist Level: Set up, Supervision or verbal cues   Set up : To apply TLSO, cervical collar (tot A to don TLSO)  Lower Body Dressing/Undressing Lower body dressing   What is the patient wearing?: Underwear, Non-skid slipper socks Underwear - Performed by patient: Thread/unthread right underwear leg, Thread/unthread left underwear leg Underwear - Performed by helper: Pull underwear up/down Pants- Performed by patient: Thread/unthread right pants leg, Thread/unthread left pants leg, Pull pants up/down   Non-skid slipper socks- Performed by patient: Don/doff right sock, Don/doff left sock   Socks - Performed by patient: Don/doff right sock, Don/doff left sock   Shoes - Performed by patient: Don/doff right shoe, Don/doff left shoe Shoes - Performed by helper: Don/doff right shoe, Don/doff left shoe       TED Hose - Performed by helper: Don/doff right TED hose, Don/doff left TED hose  Lower body assist Assist for lower body dressing:  (reacher)  Toileting Toileting Toileting activity did not occur: No continent bowel/bladder event Toileting steps completed by patient: Adjust clothing prior to toileting, Performs perineal hygiene, Adjust clothing after toileting   Toileting Assistive Devices: Grab bar or rail  Toileting assist Assist level: Touching or  steadying assistance (Pt.75%)   Transfers Chair/bed transfer Chair/bed transfer activity did not occur: Safety/medical concerns Chair/bed transfer method: Ambulatory Chair/bed transfer assist level: Touching or steadying assistance (Pt > 75%) Chair/bed transfer assistive device: Orthosis     Locomotion Ambulation     Max distance: 200' Assist level: Touching or steadying assistance (Pt > 75%)   Wheelchair Wheelchair activity did not occur: Safety/medical concerns   Max wheelchair distance: 100 Assist Level: Supervision or verbal cues  Cognition Comprehension Comprehension assist level: Understands basic 90% of the time/cues < 10% of the time  Expression Expression assist level: Expresses basic 90% of the time/requires cueing < 10% of the time.  Social Interaction Social Interaction assist level: Interacts appropriately with others with medication or extra time (anti-anxiety, antidepressant).  Problem Solving Problem solving assist level: Solves basic 90% of the time/requires cueing < 10% of the time  Memory Memory assist level: Recognizes or recalls 90% of the time/requires cueing < 10% of the time   Medical Problem List and Plan: 1. Polytrauma, right pneumothorax with right chest wall subcutaneous emphysema, right costochondral fractures, small anterior mediastinal hematoma, pneumoperitoneum as well as by vertebral fractures of T10, T11 -TLSO secondary to fall from a tree cutting branches -continue CIR therapies. Activity tolerance an issue at times 2. DVT Prophylaxis/Anticoagulation: Subcutaneous Lovenox 40 mg daily. Check vascular study on admit 3. Pain Management: Flexeril 5 mg 3 times a day, Ultram 50 mg every 6 hours -fair control in general  -TLSO when OOB  -have spoken to the patient in simple terms regarding the reason for his pain. He has some concerns about a kidney stone. Reassured him that his recent labs were ok 4. Mood: Lexapro 20  mg daily 5. Neuropsych: This patient iscapable of making decisions on hisown behalf. 6. Skin/Wound Care: Routine skin checks 7. Fluids/Electrolytes/Nutrition: re-check labs this week -encourage PO 8.Blood loss anemia. Follow-up CBC shows improvement  (11.2)  9.Ileus. Tolerating diet 10.Hypertension. Avapro 75 mg daily, HCTZ 12.5 mg daily 11.Hyperlipidemia. Zocor 12.Constipation. Laxative assistance 13. Sore throat---improved  -  continue nystatin mouth/throat wash  -chloraseptic spray prn   LOS (Days) 7 A FACE TO FACE EVALUATION WAS PERFORMED  Meredith Staggers, MD 09/04/2016 9:34 AM

## 2016-09-05 ENCOUNTER — Inpatient Hospital Stay (HOSPITAL_COMMUNITY): Payer: Medicare Other

## 2016-09-05 ENCOUNTER — Inpatient Hospital Stay (HOSPITAL_COMMUNITY): Payer: Medicare Other | Admitting: Physical Therapy

## 2016-09-05 NOTE — Progress Notes (Signed)
Physical Therapy Discharge Summary  Patient Details  Name: [name] MRN: [id number] Date of Birth: 1927/11/21  Today's Date: 09/06/2016   Patient has met 8 of 9 long term goals due to improved activity tolerance, improved balance, improved postural control, increased strength, ability to compensate for deficits, improved attention, improved awareness and improved coordination.  Patient to discharge at an ambulatory level Supervision with [facility].   Patient's daughter Claiborne Rigg) was present for family education on this date. Ina participated in donning pt's TLSO and was verbally educated on assistance pt needs at d/c. Ina voices no concerns regarding pt's d/c home.  Reasons goals not met: pt requires cuing for safety  Recommendation:  Patient will benefit from ongoing skilled PT services in home health setting to continue to advance safe functional mobility, address ongoing impairments in impaired balance, decreased endurance, impaired safety awareness, and minimize fall risk.  Equipment: No equipment provided (pt already has [place])  Reasons for discharge: treatment goals met  Patient/family agrees with progress made and goals achieved: Yes  PT Discharge Precautions/Restrictions Precautions Precautions: Fall;Back Required Braces or Orthoses: Spinal Brace Spinal Brace: Thoracolumbosacral orthotic;Applied in sitting position Restrictions Weight Bearing Restrictions: No  Motor  Motor Motor - Skilled Clinical Observations: decreased endurance   Mobility Bed Mobility Bed Mobility: Rolling Right;Rolling Left;Supine to Sit;Sit to Supine Rolling Right: 5: Supervision Rolling Left: 5: Supervision Supine to Sit: 5: Supervision Sit to Supine: 5: Supervision Transfers Transfers: Yes Sit to Stand: 5: Supervision Stand to Sit: 5: Supervision Stand Pivot Transfers: 5: Supervision (with SPC)   Locomotion  Ambulation Ambulation: Yes Ambulation/Gait Assistance: 5: Supervision Ambulation  Distance (Feet): 200 Feet Assistive device: Straight cane Gait Gait: Yes Gait Pattern:  (decreased gait speed) Stairs / Additional Locomotion Stairs: Yes Stairs Assistance: 5: Supervision Stair Management Technique: No rails;With cane Number of Stairs: 24 Height of Stairs:  (6" + 3") Ramp: 5: Supervision (with SPC) Wheelchair Mobility Wheelchair Mobility: No   Balance Balance Balance Assessed: Yes Standardized Balance Assessment Standardized Balance Assessment: Timed Up and Go Test;Berg Balance Test  Berg Balance Test Sit to Stand: Able to stand without using hands and stabilize independently Standing Unsupported: Able to stand safely 2 minutes Sitting with Back Unsupported but Feet Supported on Floor or Stool: Able to sit safely and securely 2 minutes Stand to Sit: Sits safely with minimal use of hands Transfers: Able to transfer safely, minor use of hands Standing Unsupported with Eyes Closed: Able to stand 10 seconds safely Standing Ubsupported with Feet Together: Able to place feet together independently and stand 1 minute safely From Standing, Reach Forward with Outstretched Arm: Can reach forward >12 cm safely (5") From Standing Position, Pick up Object from Floor: Unable to try/needs assist to keep balance (unable to attempt 2/2 back precautions & TLSO) From Standing Position, Turn to Look Behind Over each Shoulder: Needs supervision when turning (limited by TLSO & back precautions) Turn 360 Degrees: Able to turn 360 degrees safely but slowly Standing Unsupported, Alternately Place Feet on Step/Stool: Able to complete 4 steps without aid or supervision (completes 8 steps in <20 seconds but requires supervision) Standing Unsupported, One Foot in Front: Able to take small step independently and hold 30 seconds Standing on One Leg: Tries to lift leg/unable to hold 3 seconds but remains standing independently Total Score: 39  Timed Up and Go Test TUG: Normal TUG Normal TUG  (seconds): 12.93 (12.93 seconds with SPC, 13.05 seconds without AD)   Extremity Assessment  RLE Assessment RLE Assessment:  Within Functional Limits LLE Assessment LLE Assessment: Within Functional Limits   See Function Navigator for Current Functional Status.  [name] 09/06/2016, 2:56 PM

## 2016-09-05 NOTE — Progress Notes (Signed)
[facility] PHYSICAL MEDICINE & REHABILITATION     PROGRESS NOTE    Subjective/Complaints: Pt pleasant. Lying in bed. Good appetite. Back pain better. No pain this morning. Swallowing without issues  ROS: pt denies nausea, vomiting, diarrhea, cough, shortness of breath or chest pain    Objective: Vital Signs: Blood pressure 134/63, pulse 69, temperature 97.7 F (36.5 C), temperature source Oral, resp. rate 18, height 5\' 6"  (1.676 m), weight 69.3 kg (152 lb 12.8 oz), SpO2 98 %. (TEMP NOT VALID) No results found. No results for input(s): WBC, HGB, HCT, PLT in the last 72 hours. No results for input(s): NA, K, CL, GLUCOSE, BUN, CREATININE, CALCIUM in the last 72 hours.  Invalid input(s): CO CBG (last 3)   Recent Labs  09/02/16 1147  GLUCAP 123*    Wt Readings from Last 3 Encounters:  09/05/16 69.3 kg (152 lb 12.8 oz)  08/28/16 75 kg (165 lb 6.4 oz)  05/05/15 77.1 kg (170 lb)    Physical Exam:  Constitutional. He appears well-developed. Mood and behavior appear normal Obese HEENT.oral mucosa pink and moist.  Head normocephalic Eyes pupils round and reactive to light no discharge Neck. No JVD Cardiac: RRR Chest- CTA B GI. BS+, soft NT Musculoskeletal. He exhibits no edema. Low and mid back/flank tender to palpation Skin. Warm and dry.  Neurological: He is alert.   Motor: B/LUE, RLE 5/5 unchanged LLE: HF and KE4/5, distally 5/5--stable Sensory exam intact Cognitively intact Psych: pleasant and cooperative  Assessment/Plan: 1. Functional and mobility deficits secondary to polytrauma which require 3+ hours per day of interdisciplinary therapy in a comprehensive inpatient rehab setting. Physiatrist is providing close team supervision and 24 hour management of active medical problems listed below. Physiatrist and rehab team continue to assess barriers to discharge/monitor patient progress toward functional and medical goals.  Function:  Bathing Bathing position    Position: Shower  Bathing parts Body parts bathed by patient: Right arm, Left arm, Chest, Abdomen, Front perineal area, Buttocks, Right upper leg, Left upper leg, Left lower leg, Back, Right lower leg Body parts bathed by helper: Right lower leg, Back  Bathing assist Assist Level: Supervision or verbal cues      Upper Body Dressing/Undressing Upper body dressing   What is the patient wearing?: Pull over shirt/dress     Pull over shirt/dress - Perfomed by patient: Thread/unthread right sleeve, Thread/unthread left sleeve, Put head through opening, Pull shirt over trunk Pull over shirt/dress - Perfomed by helper: Pull shirt over trunk        Upper body assist Assist Level: Set up, Supervision or verbal cues   Set up : To apply TLSO, cervical collar (tot A to don TLSO)  Lower Body Dressing/Undressing Lower body dressing   What is the patient wearing?: Underwear, Non-skid slipper socks Underwear - Performed by patient: Thread/unthread right underwear leg, Thread/unthread left underwear leg Underwear - Performed by helper: Pull underwear up/down Pants- Performed by patient: Thread/unthread right pants leg, Thread/unthread left pants leg, Pull pants up/down   Non-skid slipper socks- Performed by patient: Don/doff right sock, Don/doff left sock   Socks - Performed by patient: Don/doff right sock, Don/doff left sock   Shoes - Performed by patient: Don/doff right shoe, Don/doff left shoe Shoes - Performed by helper: Don/doff right shoe, Don/doff left shoe       TED Hose - Performed by helper: Don/doff right TED hose, Don/doff left TED hose  Lower body assist Assist for lower body dressing:  (reacher)  Toileting Toileting Toileting activity did not occur: No continent bowel/bladder event Toileting steps completed by patient: Adjust clothing prior to toileting, Performs perineal hygiene, Adjust clothing after toileting   Toileting Assistive Devices: Grab bar or rail  Toileting  assist Assist level: Touching or steadying assistance (Pt.75%)   Transfers Chair/bed transfer Chair/bed transfer activity did not occur: Safety/medical concerns Chair/bed transfer method: Ambulatory Chair/bed transfer assist level: Touching or steadying assistance (Pt > 75%) Chair/bed transfer assistive device: Orthosis     Locomotion Ambulation     Max distance: 150 ft Assist level: Supervision or verbal cues   Wheelchair Wheelchair activity did not occur: Safety/medical concerns   Max wheelchair distance: 100 Assist Level: Supervision or verbal cues  Cognition Comprehension Comprehension assist level: Understands basic 90% of the time/cues < 10% of the time  Expression Expression assist level: Expresses basic 90% of the time/requires cueing < 10% of the time.  Social Interaction Social Interaction assist level: Interacts appropriately 90% of the time - Needs monitoring or encouragement for participation or interaction.  Problem Solving Problem solving assist level: Solves basic 90% of the time/requires cueing < 10% of the time  Memory Memory assist level: Recognizes or recalls 90% of the time/requires cueing < 10% of the time   Medical Problem List and Plan: 1. Polytrauma, right pneumothorax with right chest wall subcutaneous emphysema, right costochondral fractures, small anterior mediastinal hematoma, pneumoperitoneum as well as by vertebral fractures of T10, T11 -TLSO secondary to fall from a tree cutting branches -continue CIR therapies. Activity tolerance an issue at times  -had a lengthy discussion with daughter re: back/brace/need for kyphoplasty. Will discuss further with NS today. Plan has been conservative care with TLSO 2. DVT Prophylaxis/Anticoagulation: Subcutaneous Lovenox 40 mg daily.   -small dvt right posterior tibial vein on dopplers, 3/20---recheck today prior to discharge 3. Pain Management: Flexeril 5 mg 3 times a day, Ultram 50 mg every 6  hours -improved pain control  -TLSO when OOB   4. Mood: Lexapro 20 mg daily 5. Neuropsych: This patient iscapable of making decisions on hisown behalf. 6. Skin/Wound Care: Routine skin checks 7. Fluids/Electrolytes/Nutrition: re-check labs this week -encourage PO 8.Blood loss anemia. Follow-up CBC shows improvement  (11.2)  9.Ileus. Tolerating diet 10.Hypertension. Avapro 75 mg daily, HCTZ 12.5 mg daily 11.Hyperlipidemia. Zocor 12.Constipation. Laxative assistance 13. Sore throat---improved  -  continue nystatin mouth/throat wash  -chloraseptic spray prn   Greater than 50% of time during this encounter was spent counseling patient/family in regard to counseling regarding back/brace/options  .   LOS (Days) 8 A FACE TO FACE EVALUATION WAS PERFORMED  Meredith Staggers, MD 09/05/2016 10:52 AM

## 2016-09-05 NOTE — Progress Notes (Signed)
Occupational Therapy Session Note  Patient Details  Name: [name] MRN: [id number] Date of Birth: [birth year]  Today's Date: 09/05/2016 OT Individual Time: 0900-1000 OT Individual Time Calculation (min): 60 min    Short Term Goals: Week 1:  OT Short Term Goal 1 (Week 1): pt will sit to stand with supervision in prep for clothing management OT Short Term Goal 2 (Week 1): Pt will groom in standing for 2 grooming items to improve functional endurance OT Short Term Goal 3 (Week 1): Pt will complete tub transfer with supervision and LRAD OT Short Term Goal 4 (Week 1): Pt will complete LB dressing with supervision and AE prn  Skilled Therapeutic Interventions/Progress Updates:    Pt engaged in ongoing BADL retraining with focus on safety awareness, functional amb with SPC, standing balance, and activity tolerance.  Pt continues to require assistance with doffing/donning TLSO. Pt completes all other tasks without assistance.  Pt uses AE appropriately to assist with LB dressing tasks.  Pt stood at sink for approx 5 mins to complete grooming tasks (oral hygiene, brushing hair, shaving).  Pt verbalizes understanding of importance of wearing TLSO when standing/walking.  Pt performs walkin shower transfer with supervision and simulated tub transfer with supervision using tub transfer bench.    Therapy Documentation Precautions:  Precautions Precautions: Fall, Back Precaution Comments: Pt accustomed to being independent so needs chair alarm when up.  Not safe to get up alone Required Braces or Orthoses: Spinal Brace Spinal Brace: Thoracolumbosacral orthotic, Applied in sitting position Restrictions Weight Bearing Restrictions: No Pain: Pain Assessment Pain Assessment: 0-10 Pain Score: 0-No pain Faces Pain Scale: Hurts a little bit  See Function Navigator for Current Functional Status.   Therapy/Group: Individual Therapy  Leroy Libman 09/05/2016, 10:08 AM

## 2016-09-05 NOTE — Progress Notes (Signed)
Occupational Therapy Note  Patient Details  Name: [name] MRN: [id number] Date of Birth: 07/01/1927  Today's Date: 09/05/2016 OT Individual Time: 1330-1400 OT Individual Time Calculation (min): 30 min   Pt denied pain Individual therapy  Pt engaged in ongoing functional amb with [facility] training for simple home mgmt tasks and safety awareness.  Pt demonstrated improved safety awareness and adherence to precautions.  Pt verbalized understanding of TLSO donning schedule and verbalized understanding that he is not to walk around or stand up without TLSO on.  Pt verbalized understanding of possible physical/neurological consequences.Pt amb with SPC to ADL apartment from room and returned without any rest breaks.  Pt returned to bed with all needs within reach and bed alarm activated.   [name] [facility] 09/05/2016, 2:38 PM

## 2016-09-05 NOTE — Progress Notes (Signed)
Physical Therapy Session Note  Patient Details  Name: [name] MRN: [id number] Date of Birth: [birth year]  Today's Date: 09/05/2016 PT Individual Time: 1048-1200 and 1601-0932 PT Individual Time Calculation (min): 72 min and 28 min  Short Term Goals: Week 1:  PT Short Term Goal 1 (Week 1): Pt will negotiate 3 steps without rails with min assist. PT Short Term Goal 2 (Week 1): Pt will ambulate 150 ft with LRAD & supervision. PT Short Term Goal 3 (Week 1): Pt will complete all transfers with supervision and LRAD.  Skilled Therapeutic Interventions/Progress Updates:  Treatment 1: Pt received in bed & agreeable to tx. Pt noted rib pain with movement when attempting to don TLSO - provided assistance with donning brace. Session focused on grad day activities with pt completing all functional mobility tasks with supervision overall and use of SPC. Pt is able to recall 2/3 back precautions without assistance. Pt continues to require assistance to don TLSO sitting EOB as pt is unable to adjust straps. Pt utilized nu-step on level 4 x 10 minutes with BLE only with task focusing on endurance & strength training. Pt with less c/o fatigue on this date as compared to other dates. Pt completed TUG with SPC = 12.93 seconds and without AD in 13.05 seconds. Educated pt on when to wear TLSO at home, as well discussed d/c plans and recommendations with pt voicing no concerns regarding d/c home tomorrow. At end of session pt left sitting in w/c in room with QRB & chair alarm donned, all needs within reach, & set up with meal tray.   Treatment 2: Pt received in bed & reluctantly agreeable to tx. Pt without c/o pain. Pt transferred supine>sitting EOB & therapist provided max assist for donning TLSO for time management. Pt ambulated to gym with [facility] & supervision. Pt completed Berg Balance Test & scored [phone]; educated pt on interpretation of score & current fall risk. Patient demonstrates increased fall risk as noted by  score of 39/56 on Berg Balance Scale.  (<36= high risk for falls, close to 100%; 37-45 significant >80%; 46-51 moderate >50%; 52-55 lower >25%) Please see d/c summary for full Berg Balance Test details. At end of session pt left sitting in w/c in room with chair alarm on & interpreter present.   Professional interpreter present for sessions.  Therapy Documentation Precautions:  Precautions Precautions: Fall, Back Required Braces or Orthoses: Spinal Brace Spinal Brace: Thoracolumbosacral orthotic, Applied in sitting position Restrictions Weight Bearing Restrictions: No  Balance: Balance Balance Assessed: Yes Standardized Balance Assessment Standardized Balance Assessment: Timed Up and Go Test Timed Up and Go Test TUG: Normal TUG Normal TUG (seconds): 12.93 (12.93 seconds with SPC, 13.05 seconds without AD)   See Function Navigator for Current Functional Status.   Therapy/Group: Individual Therapy  [name] 09/05/2016, 2:21 PM

## 2016-09-05 NOTE — Discharge Instructions (Signed)
Inpatient Rehab Discharge Instructions  Ajani Rineer Discharge date and time: No discharge date for patient encounter.   Activities/Precautions/ Functional Status: Activity: Back brace when out of bed Diet: regular diet Wound Care: None needed Functional status:  ___ No restrictions     ___ Walk up steps independently ___ 24/7 supervision/assistance   ___ Walk up steps with assistance ___ Intermittent supervision/assistance  ___ Bathe/dress independently ___ Walk with walker     _x__ Bathe/dress with assistance ___ Walk Independently    ___ Shower independently ___ Walk with assistance    ___ Shower with assistance ___ No alcohol     ___ Return to work/school ________  COMMUNITY REFERRALS UPON DISCHARGE:   Home Health:   PT     OT  Agency:  [facility] Phone:  [phone] Medical Equipment/Items Ordered:  Tub transfer bench  Agency/Supplier:  [facility]       Phone:  [phone] Other:  Eugenio Saenz requested through [employer] and [employer] [place] requested to provide the in-home services.  Special Instructions: No driving   My questions have been answered and I understand these instructions. I will adhere to these goals and the provided educational materials after my discharge from the hospital.  Patient/Caregiver Signature _______________________________ Date __________  Clinician Signature _______________________________________ Date __________  Please bring this form and your medication list with you to all your follow-up doctor's appointments.

## 2016-09-06 ENCOUNTER — Inpatient Hospital Stay (HOSPITAL_COMMUNITY): Payer: Medicare Other | Admitting: Physical Therapy

## 2016-09-06 ENCOUNTER — Inpatient Hospital Stay (HOSPITAL_COMMUNITY): Payer: Medicare Other

## 2016-09-06 DIAGNOSIS — Z86718 Personal history of other venous thrombosis and embolism: Secondary | ICD-10-CM

## 2016-09-06 MED ORDER — OMEPRAZOLE 20 MG PO CPDR: 20.0000 mg | DELAYED_RELEASE_CAPSULE | Freq: Every day | ORAL | 0 refills | Status: DC

## 2016-09-06 MED ORDER — ESCITALOPRAM OXALATE 20 MG PO TABS: 20.0000 mg | ORAL_TABLET | Freq: Every day | ORAL | 4 refills | Status: DC

## 2016-09-06 MED ORDER — RIVAROXABAN 20 MG PO TABS: 20.0000 mg | ORAL_TABLET | Freq: Every day | ORAL | 1 refills | Status: DC

## 2016-09-06 MED ORDER — CYCLOBENZAPRINE HCL 5 MG PO TABS: 5.0000 mg | ORAL_TABLET | Freq: Three times a day (TID) | ORAL | 0 refills | Status: DC

## 2016-09-06 MED ORDER — LINACLOTIDE 145 MCG PO CAPS: 145.0000 ug | ORAL_CAPSULE | Freq: Every day | ORAL | 1 refills | Status: DC

## 2016-09-06 MED ORDER — RIVAROXABAN 20 MG PO TABS
20.0000 mg | ORAL_TABLET | Freq: Every day | ORAL | Status: DC
  Administered 2016-09-06: 20 mg via ORAL
  Filled 2016-09-06: qty 1

## 2016-09-06 MED ORDER — DOCUSATE SODIUM 100 MG PO CAPS: 100.0000 mg | ORAL_CAPSULE | Freq: Two times a day (BID) | ORAL | 0 refills | Status: DC

## 2016-09-06 MED ORDER — TRAMADOL HCL 50 MG PO TABS: 50.0000 mg | ORAL_TABLET | Freq: Four times a day (QID) | ORAL | 0 refills | Status: DC

## 2016-09-06 MED ORDER — POLYETHYLENE GLYCOL 3350 17 G PO PACK: 17.0000 g | PACK | Freq: Every day | ORAL | 0 refills | Status: DC

## 2016-09-06 MED ORDER — VALSARTAN 80 MG PO TABS: 80.0000 mg | ORAL_TABLET | Freq: Every morning | ORAL | 0 refills | Status: DC

## 2016-09-06 MED ORDER — HYDROCHLOROTHIAZIDE 12.5 MG PO CAPS: 12.5000 mg | ORAL_CAPSULE | Freq: Every day | ORAL | 0 refills | Status: DC

## 2016-09-06 NOTE — Plan of Care (Signed)
Problem: RH Bed to Chair Transfers Goal: LTG Patient will perform bed/chair transfers w/assist (PT) LTG: Patient will perform bed/chair transfers with assistance, with/without cues (PT).  Outcome: Not Met (add Reason) Cuing for safety  Problem: RH Ambulation Goal: LTG Patient will ambulate in controlled environment (PT) LTG: Patient will ambulate in a controlled environment, # of feet with assistance (PT).  Outcome: Completed/Met Date Met: 09/06/16 150 ft with SPC Goal: LTG Patient will ambulate in home environment (PT) LTG: Patient will ambulate in home environment, # of feet with assistance (PT).  Outcome: Completed/Met Date Met: 09/06/16 50 ft with SPC  Problem: RH Stairs Goal: LTG Patient will ambulate up and down stairs w/assist (PT) LTG: Patient will ambulate up and down # of stairs with assistance (PT)  Outcome: Completed/Met Date Met: 09/06/16 3 steps with [facility]

## 2016-09-06 NOTE — Progress Notes (Signed)
*  PRELIMINARY RESULTS* Vascular Ultrasound Right lower extremity venous duplex has been completed.  Preliminary findings: DVT noted in the right posterior tibial veins. Appears unchanged from exam on 3/20. No apparent extension of clot.    Landry Mellow, RDMS, RVT  09/06/2016, 9:17 AM

## 2016-09-06 NOTE — Plan of Care (Signed)
Problem: RH KNOWLEDGE DEFICIT GENERAL Goal: RH STG INCREASE KNOWLEDGE OF SELF CARE AFTER HOSPITALIZATION Patient will demonstrate and verbalize understanding of don on/off TLSO brace.   Outcome: Completed/Met Date Met: 09/06/16 Patient and daughter able to don TLSO on / off.

## 2016-09-06 NOTE — Discharge Summary (Signed)
Discharge summary job [id number]

## 2016-09-06 NOTE — Discharge Summary (Signed)
[name], [name]                    ACCOUNT NO.:  MEDICAL RECORD NO.:  [id number]  LOCATION:                                 FACILITY:  PHYSICIAN:  Lauraine Rinne, P.A.  DATE OF BIRTH:  05/06/1928  DATE OF ADMISSION:  08/29/2016 DATE OF DISCHARGE:  09/06/2016                              DISCHARGE SUMMARY   DISCHARGE DIAGNOSES: 1. Polytrauma with right pneumothorax with right chest wall     subcutaneous emphysema, right costochondral fractures, small     anterior mediastinal hematoma, pneumoperitoneum, as well as     vertebral fractures of T10, T11 with conservative care secondary to     a fall while cutting down a tree. 2. Small deep vein thrombosis, right posterior tibial vein per Doppler     study, August 30, 2016. 3. Pain management. 4. Depression. 5. Blood loss anemia. 6. Ileus, resolved. 7. Hypertension. 8. Hyperlipidemia. 9. Constipation. 10.Sore throat.  HISTORY OF PRESENT ILLNESS:  [name] with history of hypertension, who lives alone.  His daughter lives in the home next to him.  He reported to be independent prior to admission.  Presented on August 13, 2016, after a fall from a tree while cutting branches.  No loss of consciousness.  Complains of back pain.  Cranial CT scan negative.  CT cervical spine negative.  CT of the chest, abdomen, and pelvis showed a right pneumothorax with right chest wall subcutaneous emphysema.  Right costochondral fractures. Small anterior mediastinal hematoma.  No evidence for great vessel injury or acute intraabdominal injury.  Conservative care, right pneumothorax.  MRI of the thoracic spine for increasing back pain, August 24, 2016, showed acute-appearing T10 fracture without height loss.  Mild T11 acute compression fracture and edema.  Neurosurgery consulted Dr. Christella Noa.  Advised conservative care with TLSO back brace applied.  No plan for kyphoplasty.  The patient did undergo a right  thoracentesis for right pleural effusion, August 24, 2016, with a 900 mL yield of bloody fluid removed.  Decrease in hemoglobin.  Followup CT abdomen and pelvis showed pneumoperitoneum of which he was transfused and close monitoring. Developed nausea, abdominal bloating, as well as reports of chronic dysphagia with Gastroenterology consulted.  A modified barium swallow completed, maintained on a regular diet.  A plain abdominal film suggested mild large and small bowel ileus.  No signs of peritonitis. Initially, a nasogastric tube was placed for decompression.  Diet slowly advanced.  Physical and occupational therapy ongoing.  The patient was admitted for comprehensive rehab program.  PAST MEDICAL HISTORY:  See discharge diagnoses.  SOCIAL HISTORY:  By report, lives alone, independent prior to admission. His daughter lives next door.  FUNCTIONAL STATUS UPON ADMISSION TO REHAB SERVICES:  Minimal guard 170 feet, rolling walker; minimal guard sit to stand; min-to-mod assist activities of daily living.  PHYSICAL EXAMINATION:  VITAL SIGNS:  Blood pressure 136/61, pulse 62, temperature 98, respirations 16. GENERAL:  This was an alert male, somewhat anxious.  Followed basic commands.  Sensation appeared intact. LUNGS:  Clear to auscultation without wheeze. CARDIAC:  Regular rhythm, no murmur. ABDOMEN:  Mildly distended and nontender.  He does  have bowel sounds. MUSCULOSKELETAL:  Exhibited no edema. SKIN:  Warm and dry. EXTREMITIES:  Upper extremities:  5/5.  Lower extremities:  4/5 proximal, 5/5 distal.  REHABILITATION HOSPITAL COURSE:  The patient was admitted to Inpatient Rehab Services with therapies initiated on a 3-hour daily basis, consisting of physical therapy, occupational therapy, and rehabilitation nursing.  The following issues were addressed during the patient's rehabilitation stay.  Pertaining to multitrauma after falling from a tree, right pneumothorax, stable.  He did  undergo thoracentesis, conservative care, and right costochondral fractures.  Followed by Neurosurgery for vertebral fractures of T10-11, TLSO back brace was placed.  A long conversation with family on conservative care.  No plan for kyphoplasty at this time.  Routine venous Doppler studies on August 30, 2016, showed a small DVT, right posterior tibial vein.  The patient asymptomatic.  He continued on Lovenox.  Followup Doppler studies were Unchanged 3/37/2018 thus lovenox was discontinued and added Xarelto with recommendations to follow up Duplex one month.  Pain management with the use of Flexeril as well as Ultram and close monitoring of mental status.  He continued on Lexapro for depression.  He was participating with his therapies.  However, on numerous occasions, he did need ongoing encouragement to maintain his back brace.  Acute blood loss anemia, stable.  Latest hemoglobin 11.2. Blood pressure is well controlled on Avapro as well as low-dose HCTZ. Bouts of constipation resolved with laxative assistance.  The patient did have a mild sore throat, afebrile, received nystatin mouth and throat wash, Chloraseptic spray with good results.  His appetite continued to improve.  The patient received weekly collaborative interdisciplinary team conferences to discuss estimated length of stay, family teaching, any barriers to discharge.  He was ambulating with his TLSO back brace and assistive device.  Sessions focused on functional mobility tasks with supervision overall.  He graduated to a straight- point cane.  He continued to require some assistance with donning his brace.  Transfer supine to sit with supervision.  He could ambulate to the gymnasium for his therapies.  He scored 39/56 on Berg balance testing.  He could gather belongings for activities of daily living and homemaking.  Again, it was stressed with family the need for supervision for safety and maintaining his back brace.  Full  family teaching was completed and discharged to home.  DISCHARGE MEDICATIONS: 1. Xarelto 20 mg daily 2. Flexeril 5 mg p.o. t.i.d. 3. Colace 100 mg p.o. b.i.d. 4. Lexapro 20 mg p.o. daily. 5. HCTZ 12.5 mg p.o. daily. 6. Avapro 75 mg p.o. daily. 7. Linzess 145 mcg p.o. daily. 8. Protonix 40 mg p.o. daily. 9. MiraLAX daily. 10.Zocor 20 mg p.o. daily. 11.Ultram 50 mg p.o. every 6 hours as needed pain.  DIET:  Regular.  SPECIAL INSTRUCTIONS:  The patient would follow up with Dr. Alger Simons at the outpatient rehab service office as directed.  Dr. Ashok Pall, call for appointment; Dr. Lajean Manes, Medical Management. Special instructions, back brace when out of bed.  No driving.  Home health therapies are arranged.  Follow up Duplex one month evaluate DVT    Lauraine Rinne, P.A.     DA/MEDQ  D:  09/06/2016  T:  09/06/2016  Job:  [id number]  cc:   Meredith Staggers, M.D. Ashok Pall, M.D. Hal T. Stoneking, M.D.

## 2016-09-06 NOTE — Progress Notes (Signed)
Patient discharged to home at approximately 55 with daughter and grandson. Patient and daughter verbalized understanding of discharge instructions given and reviewed by D. [place], [place]. Daughter able to don TLSO brace on and off of patient. Daughter verbalized understanding of importance of patient wearing TLSO brace when out of bed.  [name]

## 2016-09-06 NOTE — Progress Notes (Addendum)
Physical Therapy Session Note  Patient Details  Name: Styles Fambro MRN: [id number] Date of Birth: [birth year]  Today's Date: 09/06/2016 PT Individual Time: 2409-7353 PT Individual Time Calculation (min): 32 min   Short Term Goals: Week 1:  PT Short Term Goal 1 (Week 1): Pt will negotiate 3 steps without rails with min assist. PT Short Term Goal 2 (Week 1): Pt will ambulate 150 ft with LRAD & supervision. PT Short Term Goal 3 (Week 1): Pt will complete all transfers with supervision and LRAD.  Skilled Therapeutic Interventions/Progress Updates:  Pt received in bed with daughter Claiborne Rigg) and grandson present for family education. Pt reports need to use restroom & transfers supine<>sitting EOB with supervision and bed rails with good adherence to back precautions. Educated Ina on donning brace with her assisting. Pt ambulated within room & bathroom with [facility] & supervision, completed toilet transfer, and with continent BM. Pt with urine spillage on pants & donned clean underwear & pants with supervision and use of reacher. Educated pt & family on need to use reacher & sock aide to assist with dressing. Verbally educated [place] on pt's need to wear TLSO anytime when OOB, positioning when pt negotiates stairs, need to use SPC at all times when ambulating, and sequencing for tub transfers & bathing. Claiborne Rigg reports no concerns regarding d/c home. At end of session pt left in bed with daughter present to supervise.  Educated Ina on pt's need to wear TLSO until told otherwise by MD.   Addendum: Lyn Hollingshead on HHPT f/u recommendations.   Therapy Documentation Precautions:  Precautions Precautions: Fall, Back Precaution Comments: Pt accustomed to being independent so needs chair alarm when up.  Not safe to get up alone Required Braces or Orthoses: Spinal Brace Spinal Brace: Thoracolumbosacral orthotic, Applied in sitting position Restrictions Weight Bearing Restrictions: No  Pain: No c/o pain reported.    See Function Navigator for Current Functional Status.   Therapy/Group: Individual Therapy  [name] 09/06/2016, 2:02 PM

## 2016-09-06 NOTE — Progress Notes (Signed)
[place] PHYSICAL MEDICINE & REHABILITATION     PROGRESS NOTE    Subjective/Complaints: Up standing at sink. No pain. In very good spirits. Excited to go home  ROS: pt denies nausea, vomiting, diarrhea, cough, shortness of breath or chest pain    Objective: Vital Signs: Blood pressure (!) 121/58, pulse 69, temperature 98 F (36.7 C), temperature source Oral, resp. rate 18, height 5\' 6"  (1.676 m), weight 72.7 kg (160 lb 3.2 oz), SpO2 94 %. (TEMP NOT VALID) No results found. No results for input(s): WBC, HGB, HCT, PLT in the last 72 hours. No results for input(s): NA, K, CL, GLUCOSE, BUN, CREATININE, CALCIUM in the last 72 hours.  Invalid input(s): CO CBG (last 3)  No results for input(s): GLUCAP in the last 72 hours.  Wt Readings from Last 3 Encounters:  09/06/16 72.7 kg (160 lb 3.2 oz)  08/28/16 75 kg (165 lb 6.4 oz)  05/05/15 77.1 kg (170 lb)    Physical Exam:  Constitutional. He appears well-developed. Mood and behavior appear normal Obese HEENT.oral mucosa pink and moist.  Head normocephalic Eyes pupils round and reactive to light no discharge Neck. No JVD Cardiac: RRR Chest- CTA B GI. BS+, soft NT Musculoskeletal. He exhibits no edema. Mild flank/trunk/back pain Skin. Warm and dry.  Neurological: He is alert.   Motor: B/LUE, RLE 5/5 unchanged LLE: HF and KE5/5, distally 5/5--stable. Excellent standing balance Sensory exam intact Cognitively intact Psych: pleasant and cooperative  Assessment/Plan: 1. Functional and mobility deficits secondary to polytrauma which require 3+ hours per day of interdisciplinary therapy in a comprehensive inpatient rehab setting. Physiatrist is providing close team supervision and 24 hour management of active medical problems listed below. Physiatrist and rehab team continue to assess barriers to discharge/monitor patient progress toward functional and medical goals.  Function:  Bathing Bathing position   Position: Shower   Bathing parts Body parts bathed by patient: Right arm, Left arm, Chest, Abdomen, Front perineal area, Buttocks, Right upper leg, Left upper leg, Left lower leg, Back, Right lower leg Body parts bathed by helper: Right lower leg, Back  Bathing assist Assist Level: Supervision or verbal cues      Upper Body Dressing/Undressing Upper body dressing   What is the patient wearing?: Pull over shirt/dress, Orthosis     Pull over shirt/dress - Perfomed by patient: Thread/unthread right sleeve, Thread/unthread left sleeve, Put head through opening, Pull shirt over trunk Pull over shirt/dress - Perfomed by helper: Pull shirt over trunk        Upper body assist Assist Level: Set up   Set up : To apply TLSO, cervical collar  Lower Body Dressing/Undressing Lower body dressing   What is the patient wearing?: Underwear, Non-skid slipper socks, Shoes Underwear - Performed by patient: Thread/unthread right underwear leg, Thread/unthread left underwear leg, Pull underwear up/down Underwear - Performed by helper: Pull underwear up/down Pants- Performed by patient: Thread/unthread right pants leg, Thread/unthread left pants leg, Pull pants up/down   Non-skid slipper socks- Performed by patient: Don/doff right sock, Don/doff left sock   Socks - Performed by patient: Don/doff right sock, Don/doff left sock   Shoes - Performed by patient: Don/doff right shoe, Don/doff left shoe Shoes - Performed by helper: Don/doff right shoe, Don/doff left shoe       TED Hose - Performed by helper: Don/doff right TED hose, Don/doff left TED hose  Lower body assist Assist for lower body dressing: Assistive device, More than reasonable time  Toileting Toileting Toileting activity did not occur: No continent bowel/bladder event Toileting steps completed by patient: Adjust clothing prior to toileting, Performs perineal hygiene, Adjust clothing after toileting   Toileting Assistive Devices: Grab bar or rail   Toileting assist Assist level: More than reasonable time   Transfers Chair/bed transfer Chair/bed transfer activity did not occur: Safety/medical concerns Chair/bed transfer method: Stand pivot Chair/bed transfer assist level: Supervision or verbal cues Chair/bed transfer assistive device: Librarian, academic     Max distance: 150 ft Assist level: Supervision or verbal cues   Wheelchair Wheelchair activity did not occur: N/A   Max wheelchair distance: 100 Assist Level: Supervision or verbal cues  Cognition Comprehension Comprehension assist level: Understands complex 90% of the time/cues 10% of the time  Expression Expression assist level: Expresses complex ideas: With no assist  Social Interaction Social Interaction assist level: Interacts appropriately with others with medication or extra time (anti-anxiety, antidepressant).  Problem Solving Problem solving assist level: Solves basic 90% of the time/requires cueing < 10% of the time  Memory Memory assist level: Requires cues to use assistive device   Medical Problem List and Plan: 1. Polytrauma, right pneumothorax with right chest wall subcutaneous emphysema, right costochondral fractures, small anterior mediastinal hematoma, pneumoperitoneum as well as by vertebral fractures of T10, T11 -TLSO secondary to fall from a tree cutting branches -home today with HH follow up  -reviewed TLSO use--needs to be on at [place] until NS follow up  -Patient to see MD in the office for transitional care encounter in 1-2 weeks. 2. DVT Prophylaxis/Anticoagulation: Subcutaneous Lovenox 40 mg daily currently.   -small dvt right posterior tibial vein on dopplers--still present on repeat dopplers today  -will send home on xarelto 20mg  daily---repeat dopplers in a month 3. Pain Management: Flexeril 5 mg 3 times a day, Ultram 50 mg every 6 hours -improved pain control  -TLSO when OOB   4. Mood:  Lexapro 20 mg daily 5. Neuropsych: This patient iscapable of making decisions on hisown behalf. 6. Skin/Wound Care: Routine skin checks 7. Fluids/Electrolytes/Nutrition: re-check labs this week -encourage PO 8.Blood loss anemia. Follow-up CBC shows improvement  (11.2)  9.Ileus. Tolerating diet 10.Hypertension. Avapro 75 mg daily, HCTZ 12.5 mg daily 11.Hyperlipidemia. Zocor 12.Constipation. Laxative assistance 13. Sore throat---improved  -chloraseptic spray prn     .   LOS (Days) 9 A FACE TO FACE EVALUATION WAS PERFORMED  Meredith Staggers, MD 09/06/2016 12:07 PM

## 2016-09-06 NOTE — Progress Notes (Signed)
Occupational Therapy Discharge Summary  Patient Details  Name: [name] MRN: [id number] Date of Birth: 1927/09/01  Patient has met 72 of 13 long term goals due to improved activity tolerance, improved balance, postural control, ability to compensate for deficits and improved coordination. Pt made steady progress with BADLs during this admission.  Pt is supervision/mod I for bathing, dressing, toileting tasks, and functional transfers.  Pt continues to require assistance with doffing/donning his TLSO.  Family has not been present for therapy.  Patient to discharge at overall Supervision level.  Patient's care partner have been recommended  to provide the necessary physical and cognitive (supervision and assistance with the brace) assistance at discharge.     Recommendation:  Patient will benefit from ongoing skilled OT services in home health setting to continue to advance functional skills in the area of BADL and Reduce care partner burden.  Equipment: tub transfer bench  Reasons for discharge: treatment goals met and discharge from hospital  Patient/family agrees with progress made and goals achieved: Yes  OT Discharge   Vision/Perception  Vision- History Baseline Vision/History: Wears glasses Wears Glasses: Distance only Patient Visual Report: No change from baseline Vision- Assessment Vision Assessment?: No apparent visual deficits  Cognition Overall Cognitive Status: Within Functional Limits for tasks assessed Arousal/Alertness: Awake/alert Orientation Level: Oriented X4 Attention: Selective Selective Attention: Appears intact Memory: Appears intact Awareness: Appears intact Problem Solving: Appears intact Problem Solving Impairment: Functional basic Safety/Judgment: Impaired Sensation Sensation Light Touch: Appears Intact Stereognosis: Appears Intact Hot/Cold: Appears Intact Proprioception: Appears Intact Coordination Gross Motor Movements are Fluid and  Coordinated: Yes Fine Motor Movements are Fluid and Coordinated: Yes Trunk/Postural Assessment  Cervical Assessment Cervical Assessment: Exceptions to [facility] (forward head) Thoracic Assessment Thoracic Assessment: Exceptions to WFL (TLSO) Lumbar Assessment Lumbar Assessment: Exceptions to WFL (TLSO) Postural Control Postural Control: Deficits on evaluation (slow protective responses)  Balance Dynamic Standing Balance Dynamic Standing - Level of Assistance: 6: Modified independent (Device/Increase time) Extremity/Trunk Assessment RUE Assessment RUE Assessment: Within Functional Limits LUE Assessment LUE Assessment: Within Functional Limits   See Function Navigator for Current Functional Status.  [name] [facility] 09/06/2016, 6:45 AM

## 2016-09-07 DIAGNOSIS — S2241XD Multiple fractures of ribs, right side, subsequent encounter for fracture with routine healing: Secondary | ICD-10-CM | POA: Diagnosis not present

## 2016-09-07 DIAGNOSIS — S22089D Unspecified fracture of T11-T12 vertebra, subsequent encounter for fracture with routine healing: Secondary | ICD-10-CM | POA: Diagnosis not present

## 2016-09-07 DIAGNOSIS — S22079D Unspecified fracture of T9-T10 vertebra, subsequent encounter for fracture with routine healing: Secondary | ICD-10-CM | POA: Diagnosis not present

## 2016-09-07 DIAGNOSIS — D649 Anemia, unspecified: Secondary | ICD-10-CM | POA: Diagnosis not present

## 2016-09-07 DIAGNOSIS — I82411 Acute embolism and thrombosis of right femoral vein: Secondary | ICD-10-CM | POA: Diagnosis not present

## 2016-09-07 DIAGNOSIS — I10 Essential (primary) hypertension: Secondary | ICD-10-CM | POA: Diagnosis not present

## 2016-09-07 DIAGNOSIS — W11XXXD Fall on and from ladder, subsequent encounter: Secondary | ICD-10-CM | POA: Diagnosis not present

## 2016-09-07 DIAGNOSIS — M069 Rheumatoid arthritis, unspecified: Secondary | ICD-10-CM | POA: Diagnosis not present

## 2016-09-07 DIAGNOSIS — F329 Major depressive disorder, single episode, unspecified: Secondary | ICD-10-CM | POA: Diagnosis not present

## 2016-09-09 DIAGNOSIS — S22079D Unspecified fracture of T9-T10 vertebra, subsequent encounter for fracture with routine healing: Secondary | ICD-10-CM | POA: Diagnosis not present

## 2016-09-09 DIAGNOSIS — I82411 Acute embolism and thrombosis of right femoral vein: Secondary | ICD-10-CM | POA: Diagnosis not present

## 2016-09-09 DIAGNOSIS — S22089D Unspecified fracture of T11-T12 vertebra, subsequent encounter for fracture with routine healing: Secondary | ICD-10-CM | POA: Diagnosis not present

## 2016-09-09 DIAGNOSIS — I10 Essential (primary) hypertension: Secondary | ICD-10-CM | POA: Diagnosis not present

## 2016-09-09 DIAGNOSIS — S2241XD Multiple fractures of ribs, right side, subsequent encounter for fracture with routine healing: Secondary | ICD-10-CM | POA: Diagnosis not present

## 2016-09-09 DIAGNOSIS — M069 Rheumatoid arthritis, unspecified: Secondary | ICD-10-CM | POA: Diagnosis not present

## 2016-09-09 NOTE — Progress Notes (Signed)
Social Work Discharge Note  The overall goal for the admission was met for:   Discharge location: Yes - home  Length of Stay: Yes - 9 days  Discharge activity level: Yes - supervision  Home/community participation: Yes  Services provided included: MD, RD, PT, OT, RN, Pharmacy and SW  Financial Services: Medicare and Medicaid  Follow-up services arranged: Home Health: PT/OT, DME: tub transfer bench and Patient/Family request agency HH: [place], DME: [facility]  Comments (or additional information): Pt to d/c to his home (Dora -dtr).  His other dtr, Claris Che, will provide supervision and they will coordinate 24/7 supervision as needed.  Pt will also have PCS at home, as well.    Patient/Family verbalized understanding of follow-up arrangements: Yes  Individual responsible for coordination of the follow-up plan: pt's dtrs  Confirmed correct DME delivered: Trey Sailors 09/09/2016    Laaibah Wartman, Silvestre Mesi

## 2016-09-12 DIAGNOSIS — I82411 Acute embolism and thrombosis of right femoral vein: Secondary | ICD-10-CM | POA: Diagnosis not present

## 2016-09-12 DIAGNOSIS — S2241XD Multiple fractures of ribs, right side, subsequent encounter for fracture with routine healing: Secondary | ICD-10-CM | POA: Diagnosis not present

## 2016-09-12 DIAGNOSIS — S22079D Unspecified fracture of T9-T10 vertebra, subsequent encounter for fracture with routine healing: Secondary | ICD-10-CM | POA: Diagnosis not present

## 2016-09-12 DIAGNOSIS — I10 Essential (primary) hypertension: Secondary | ICD-10-CM | POA: Diagnosis not present

## 2016-09-12 DIAGNOSIS — S22089D Unspecified fracture of T11-T12 vertebra, subsequent encounter for fracture with routine healing: Secondary | ICD-10-CM | POA: Diagnosis not present

## 2016-09-12 DIAGNOSIS — M069 Rheumatoid arthritis, unspecified: Secondary | ICD-10-CM | POA: Diagnosis not present

## 2016-09-14 DIAGNOSIS — I82411 Acute embolism and thrombosis of right femoral vein: Secondary | ICD-10-CM | POA: Diagnosis not present

## 2016-09-14 DIAGNOSIS — S2241XD Multiple fractures of ribs, right side, subsequent encounter for fracture with routine healing: Secondary | ICD-10-CM | POA: Diagnosis not present

## 2016-09-14 DIAGNOSIS — S22079D Unspecified fracture of T9-T10 vertebra, subsequent encounter for fracture with routine healing: Secondary | ICD-10-CM | POA: Diagnosis not present

## 2016-09-14 DIAGNOSIS — S22089D Unspecified fracture of T11-T12 vertebra, subsequent encounter for fracture with routine healing: Secondary | ICD-10-CM | POA: Diagnosis not present

## 2016-09-14 DIAGNOSIS — M069 Rheumatoid arthritis, unspecified: Secondary | ICD-10-CM | POA: Diagnosis not present

## 2016-09-14 DIAGNOSIS — I10 Essential (primary) hypertension: Secondary | ICD-10-CM | POA: Diagnosis not present

## 2016-09-19 DIAGNOSIS — M4854XA Collapsed vertebra, not elsewhere classified, thoracic region, initial encounter for fracture: Secondary | ICD-10-CM | POA: Diagnosis not present

## 2016-09-19 DIAGNOSIS — Z79899 Other long term (current) drug therapy: Secondary | ICD-10-CM | POA: Diagnosis not present

## 2016-09-19 DIAGNOSIS — Q253 Supravalvular aortic stenosis: Secondary | ICD-10-CM | POA: Diagnosis not present

## 2016-09-19 DIAGNOSIS — I10 Essential (primary) hypertension: Secondary | ICD-10-CM | POA: Diagnosis not present

## 2016-09-20 DIAGNOSIS — Z6825 Body mass index (BMI) 25.0-25.9, adult: Secondary | ICD-10-CM | POA: Diagnosis not present

## 2016-09-20 DIAGNOSIS — M546 Pain in thoracic spine: Secondary | ICD-10-CM | POA: Diagnosis not present

## 2016-09-21 DIAGNOSIS — M069 Rheumatoid arthritis, unspecified: Secondary | ICD-10-CM | POA: Diagnosis not present

## 2016-09-21 DIAGNOSIS — S22079D Unspecified fracture of T9-T10 vertebra, subsequent encounter for fracture with routine healing: Secondary | ICD-10-CM | POA: Diagnosis not present

## 2016-09-21 DIAGNOSIS — I10 Essential (primary) hypertension: Secondary | ICD-10-CM | POA: Diagnosis not present

## 2016-09-21 DIAGNOSIS — I82411 Acute embolism and thrombosis of right femoral vein: Secondary | ICD-10-CM | POA: Diagnosis not present

## 2016-09-21 DIAGNOSIS — S22089D Unspecified fracture of T11-T12 vertebra, subsequent encounter for fracture with routine healing: Secondary | ICD-10-CM | POA: Diagnosis not present

## 2016-09-21 DIAGNOSIS — S2241XD Multiple fractures of ribs, right side, subsequent encounter for fracture with routine healing: Secondary | ICD-10-CM | POA: Diagnosis not present

## 2016-09-22 DIAGNOSIS — I82411 Acute embolism and thrombosis of right femoral vein: Secondary | ICD-10-CM | POA: Diagnosis not present

## 2016-09-22 DIAGNOSIS — M069 Rheumatoid arthritis, unspecified: Secondary | ICD-10-CM | POA: Diagnosis not present

## 2016-09-22 DIAGNOSIS — I10 Essential (primary) hypertension: Secondary | ICD-10-CM | POA: Diagnosis not present

## 2016-09-22 DIAGNOSIS — S22089D Unspecified fracture of T11-T12 vertebra, subsequent encounter for fracture with routine healing: Secondary | ICD-10-CM | POA: Diagnosis not present

## 2016-09-22 DIAGNOSIS — S2241XD Multiple fractures of ribs, right side, subsequent encounter for fracture with routine healing: Secondary | ICD-10-CM | POA: Diagnosis not present

## 2016-09-22 DIAGNOSIS — S22079D Unspecified fracture of T9-T10 vertebra, subsequent encounter for fracture with routine healing: Secondary | ICD-10-CM | POA: Diagnosis not present

## 2016-09-23 DIAGNOSIS — S22079D Unspecified fracture of T9-T10 vertebra, subsequent encounter for fracture with routine healing: Secondary | ICD-10-CM | POA: Diagnosis not present

## 2016-09-23 DIAGNOSIS — S22089D Unspecified fracture of T11-T12 vertebra, subsequent encounter for fracture with routine healing: Secondary | ICD-10-CM | POA: Diagnosis not present

## 2016-09-23 DIAGNOSIS — S2241XD Multiple fractures of ribs, right side, subsequent encounter for fracture with routine healing: Secondary | ICD-10-CM | POA: Diagnosis not present

## 2016-09-23 DIAGNOSIS — I82411 Acute embolism and thrombosis of right femoral vein: Secondary | ICD-10-CM | POA: Diagnosis not present

## 2016-09-23 DIAGNOSIS — M069 Rheumatoid arthritis, unspecified: Secondary | ICD-10-CM | POA: Diagnosis not present

## 2016-09-23 DIAGNOSIS — I10 Essential (primary) hypertension: Secondary | ICD-10-CM | POA: Diagnosis not present

## 2016-09-28 DIAGNOSIS — I82411 Acute embolism and thrombosis of right femoral vein: Secondary | ICD-10-CM | POA: Diagnosis not present

## 2016-09-28 DIAGNOSIS — M069 Rheumatoid arthritis, unspecified: Secondary | ICD-10-CM | POA: Diagnosis not present

## 2016-09-28 DIAGNOSIS — S2241XD Multiple fractures of ribs, right side, subsequent encounter for fracture with routine healing: Secondary | ICD-10-CM | POA: Diagnosis not present

## 2016-09-28 DIAGNOSIS — I10 Essential (primary) hypertension: Secondary | ICD-10-CM | POA: Diagnosis not present

## 2016-09-28 DIAGNOSIS — S22089D Unspecified fracture of T11-T12 vertebra, subsequent encounter for fracture with routine healing: Secondary | ICD-10-CM | POA: Diagnosis not present

## 2016-09-28 DIAGNOSIS — S22079D Unspecified fracture of T9-T10 vertebra, subsequent encounter for fracture with routine healing: Secondary | ICD-10-CM | POA: Diagnosis not present

## 2016-09-30 DIAGNOSIS — S22089D Unspecified fracture of T11-T12 vertebra, subsequent encounter for fracture with routine healing: Secondary | ICD-10-CM | POA: Diagnosis not present

## 2016-09-30 DIAGNOSIS — S2241XD Multiple fractures of ribs, right side, subsequent encounter for fracture with routine healing: Secondary | ICD-10-CM | POA: Diagnosis not present

## 2016-09-30 DIAGNOSIS — I10 Essential (primary) hypertension: Secondary | ICD-10-CM | POA: Diagnosis not present

## 2016-09-30 DIAGNOSIS — M069 Rheumatoid arthritis, unspecified: Secondary | ICD-10-CM | POA: Diagnosis not present

## 2016-09-30 DIAGNOSIS — I82411 Acute embolism and thrombosis of right femoral vein: Secondary | ICD-10-CM | POA: Diagnosis not present

## 2016-09-30 DIAGNOSIS — S22079D Unspecified fracture of T9-T10 vertebra, subsequent encounter for fracture with routine healing: Secondary | ICD-10-CM | POA: Diagnosis not present

## 2016-10-12 ENCOUNTER — Encounter: Payer: Medicare Other | Attending: Physical Medicine & Rehabilitation | Admitting: Physical Medicine & Rehabilitation

## 2016-10-12 ENCOUNTER — Encounter: Payer: Self-pay | Admitting: Physical Medicine & Rehabilitation

## 2016-10-12 VITALS — BP 112/68 | HR 69

## 2016-10-12 DIAGNOSIS — Z833 Family history of diabetes mellitus: Secondary | ICD-10-CM | POA: Insufficient documentation

## 2016-10-12 DIAGNOSIS — R251 Tremor, unspecified: Secondary | ICD-10-CM | POA: Insufficient documentation

## 2016-10-12 DIAGNOSIS — Z87891 Personal history of nicotine dependence: Secondary | ICD-10-CM | POA: Insufficient documentation

## 2016-10-12 DIAGNOSIS — Z7901 Long term (current) use of anticoagulants: Secondary | ICD-10-CM | POA: Diagnosis not present

## 2016-10-12 DIAGNOSIS — K5900 Constipation, unspecified: Secondary | ICD-10-CM | POA: Insufficient documentation

## 2016-10-12 DIAGNOSIS — Z87442 Personal history of urinary calculi: Secondary | ICD-10-CM | POA: Diagnosis not present

## 2016-10-12 DIAGNOSIS — S22089D Unspecified fracture of T11-T12 vertebra, subsequent encounter for fracture with routine healing: Secondary | ICD-10-CM | POA: Insufficient documentation

## 2016-10-12 DIAGNOSIS — H9190 Unspecified hearing loss, unspecified ear: Secondary | ICD-10-CM | POA: Diagnosis not present

## 2016-10-12 DIAGNOSIS — I10 Essential (primary) hypertension: Secondary | ICD-10-CM | POA: Insufficient documentation

## 2016-10-12 DIAGNOSIS — N400 Enlarged prostate without lower urinary tract symptoms: Secondary | ICD-10-CM | POA: Insufficient documentation

## 2016-10-12 DIAGNOSIS — S2341XD Sprain of ribs, subsequent encounter: Secondary | ICD-10-CM | POA: Insufficient documentation

## 2016-10-12 DIAGNOSIS — W14XXXD Fall from tree, subsequent encounter: Secondary | ICD-10-CM | POA: Insufficient documentation

## 2016-10-12 DIAGNOSIS — Z823 Family history of stroke: Secondary | ICD-10-CM | POA: Insufficient documentation

## 2016-10-12 DIAGNOSIS — Z803 Family history of malignant neoplasm of breast: Secondary | ICD-10-CM | POA: Insufficient documentation

## 2016-10-12 DIAGNOSIS — F329 Major depressive disorder, single episode, unspecified: Secondary | ICD-10-CM | POA: Insufficient documentation

## 2016-10-12 DIAGNOSIS — I82401 Acute embolism and thrombosis of unspecified deep veins of right lower extremity: Secondary | ICD-10-CM | POA: Diagnosis not present

## 2016-10-12 DIAGNOSIS — S22079D Unspecified fracture of T9-T10 vertebra, subsequent encounter for fracture with routine healing: Secondary | ICD-10-CM | POA: Diagnosis not present

## 2016-10-12 DIAGNOSIS — Z86718 Personal history of other venous thrombosis and embolism: Secondary | ICD-10-CM | POA: Insufficient documentation

## 2016-10-12 DIAGNOSIS — S22079S Unspecified fracture of T9-T10 vertebra, sequela: Secondary | ICD-10-CM

## 2016-10-12 DIAGNOSIS — R531 Weakness: Secondary | ICD-10-CM | POA: Diagnosis not present

## 2016-10-12 DIAGNOSIS — M0609 Rheumatoid arthritis without rheumatoid factor, multiple sites: Secondary | ICD-10-CM | POA: Insufficient documentation

## 2016-10-12 DIAGNOSIS — K219 Gastro-esophageal reflux disease without esophagitis: Secondary | ICD-10-CM | POA: Diagnosis not present

## 2016-10-12 DIAGNOSIS — R52 Pain, unspecified: Secondary | ICD-10-CM | POA: Diagnosis not present

## 2016-10-12 NOTE — Patient Instructions (Signed)
I WANT TO YOU TO WALK AT LEAST 30 MINUTES PER DAY!!!  THE MORE YOU GET UP THE BETTER YOUR BACK WILL FEEL OVER TIME   IF YOUR DOPPLER TEST IS CLEAN, YOU CAN STOP THE XARELTO AND USE LOW DOSE IBUPROFEN. I WILL CALL YOU AND LET YOU KNOW.

## 2016-10-12 NOTE — Progress Notes (Signed)
Subjective:    Patient ID: [name], male    DOB: Sep 17, 1927, 83 y.o.   MRN: [id number]  HPI   [name] is here in follow up of his polytrauma. He is at home and has been very sedentary and has not been wearing his brace regularly at home although he has been prescribed to wear it daily. Apparently he saw Dr. Ellene Route last month and per daughter the report was encouraging and showed "healing" on xrays.   He is not getting out of the bed because of pain. He uses acetaminophen and tramadol for pain which don't provide much relief. He found ibuprofen helps more than anything else (although he's on xarelto). He is not using any heat or ice. Exercises don't help. He feels fine when he's in bed and is reluctant to get out of bed.    Pain Inventory Average Pain 8 Pain Right Now 2 My pain is aching  In the last 24 hours, has pain interfered with the following? General activity 9 Relation with others 8 Enjoyment of life 10 What TIME of day is your pain at its worst? daytime Sleep (in general) Fair  Pain is worse with: walking, bending and standing Pain improves with: rest Relief from Meds: 5  Mobility walk with assistance use a cane ability to climb steps?  yes do you drive?  no  Function I need assistance with the following:  dressing, bathing, meal prep, household duties and shopping  Neuro/Psych bladder control problems weakness tremor  Prior Studies Any changes since last visit?  no  Physicians involved in your care Any changes since last visit?  no   Family History  Problem Relation Age of Onset  . Pulmonary embolism Mother 67    caused by complications of surgery  . CVA Father   . Diabetes Brother   . Diabetes Brother   . Breast cancer Sister    Social History   Social History  . Marital status: Widowed    Spouse name: N/A  . Number of children: N/A  . Years of education: N/A   Social History Main Topics  . Smoking status: Former Smoker    Years:  40.00    Types: Cigarettes    Start date: 06/13/1973    Quit date: 04/03/1981  . Smokeless tobacco: Never Used  . Alcohol use Yes     Comment: occasional  . Drug use: No  . Sexual activity: Not on file   Other Topics Concern  . Not on file   Social History Narrative  . No narrative on file   Past Surgical History:  Procedure Laterality Date  . CARDIAC CATHETERIZATION  07-19-2010  dr Tressia Miners turner   normal coronary arteries,  ef 60%,  moderate aortic stenosis- gradient 16mmHg, normal right heart pressure  . CARDIOVASCULAR STRESS TEST  05/ 2011   dr Marlou Porch   normal low risk perfusion study  . CATARACT EXTRACTION W/ INTRAOCULAR LENS  IMPLANT, BILATERAL  2013  . COLONOSCOPY  2010  approx  . THYROID LOBECTOMY Right 05/05/2015   Procedure: RIGHT THYROID LOBECTOMY;  Surgeon: Armandina Gemma, MD;  Location: [place];  Service: General;  Laterality: Right;  . TRANSANAL HEMORRHOIDAL DEARTERIALIZATION N/A 04/09/2014   Procedure: TRANSANAL HEMORRHOIDAL DEARTERIALIZATION OF INTERNAL HEMORROIDS;  Surgeon: Leighton Ruff, MD;  Location: [facility];  Service: General;  Laterality: N/A;  . TRANSTHORACIC ECHOCARDIOGRAM  11-26-2013   moderate focal basal and mild LVH/  ef 57-32%/  grade I diastolic dysfunction/  mild LAE/  moderate calcification with stenosis AV with mild regurg,  gradients 35 abd 58 mmHg /  mild TR   Past Medical History:  Diagnosis Date  . At risk for sleep apnea    STOP-BANG= 4    SENT TO PCP 04-04-2014  . BPH (benign prostatic hypertrophy)   . Depression   . Diverticulosis of colon   . GERD (gastroesophageal reflux disease)   . Gilbert's syndrome   . Headache    migraines  . Hearing loss   . Heart murmur    "Calcified aortic valve"  . Hepatitis    Hep A as a child  . History of gastroenteritis   . History of kidney stones   . History of syncope    2002--  vasovagal  . HOH (hard of hearing)    refuses to wear his Hearing Aid  . Hypertension   . Inguinal  hernia    right  . Internal hemorrhoid, bleeding   . Moderate aortic stenosis    a. Last echo 11/2013, 06/2014.  Marland Kitchen Nocturia   . Normal coronary arteries    a. Cath 07/2010: normal coronaries. Felt to have noncardiac CP/SOB at that time possibly r/t anxiety.  . RA (rheumatoid arthritis) ([place])    bilateral hands/ wrist--  seronegative  . Rotator cuff syndrome of right shoulder 08/01/2013  . Shortness of breath dyspnea    with exertion  . Stone, kidney   . Thyroid cyst   . Thyroid nodule    noted 11-2009  . Wears dentures    There were no vitals taken for this visit.  Opioid Risk Score:   Fall Risk Score:  `1  Depression screen PHQ 2/9  No flowsheet data found.  Review of Systems  Constitutional: Negative.   HENT: Negative.   Eyes: Negative.   Respiratory: Negative.   Cardiovascular: Negative.   Gastrointestinal: Positive for constipation.  Endocrine: Negative.   Genitourinary: Positive for difficulty urinating.  Musculoskeletal: Negative.   Skin: Negative.   Allergic/Immunologic: Negative.   Neurological: Negative.   Hematological: Negative.   Psychiatric/Behavioral: Negative.   All other systems reviewed and are negative.      Objective:   Physical Exam  Constitutional. He appears well-developed. Mood and behavior appear normal Obese HEENT.oral mucosa pink and moist.  Head normocephalic Eyes pupils round and reactive to light no discharge Neck. No JVD Cardiac: RRR Chest- CTA B GI. BS+, soft NT Musculoskeletal. No edema. Wearing TLSO Skin. Warm and dry.  Neurological: He is alert.   Motor: B/LUE, RLE 5/5 unchanged LLE: HF and KE4-5/5 with some pain inhibition, distally 5/5--stable. Uses cane for balance and does well. Favors right side slightly.  Sensory exam intact Cognitively intact Psych: pleasant and cooperative      Assessment & Plan:  1. Polytrauma, right pneumothorax with right chest wall subcutaneous emphysema, right costochondral  fractures, small anterior mediastinal hematoma, pneumoperitoneum as well as by vertebralfractures of T10, T11 -TLSO secondary to fall from a tree cutting branches -remains sedentary mostly due to pain    -can consider low dose ibuprofen if dopplers clear 2. DVT Prophylaxis/Anticoagulation:            -small dvt right posterior tibial vein on dopplers           -repeat dopplers. If negative he may stop xarelto 3. Pain Management:  , Ultram 50 mg every 6 hours PRN -Encouraged increased mobility/ambulation. Reinforced that increased activity will help healing in his back -TLSO when  OOB           -tylenol for pain, can take up to 1000mg  twice daily  4. Mood: Lexapro 20 mg daily 6. RA: MTX   Fifteen minutes of face to face patient care time were spent during this visit. All questions were encouraged and answered.  Follow up in two months.

## 2016-10-20 ENCOUNTER — Ambulatory Visit: Payer: Medicare Other | Admitting: Sports Medicine

## 2016-10-24 ENCOUNTER — Ambulatory Visit (HOSPITAL_COMMUNITY)
Admission: RE | Admit: 2016-10-24 | Discharge: 2016-10-24 | Disposition: A | Discharge status: Home / Self Care | Payer: Medicare Other | Source: Ambulatory Visit | Attending: Surgery | Admitting: Surgery

## 2016-10-24 DIAGNOSIS — I82401 Acute embolism and thrombosis of unspecified deep veins of right lower extremity: Secondary | ICD-10-CM | POA: Insufficient documentation

## 2016-10-24 NOTE — Progress Notes (Signed)
Preliminary report was called to Dr. Charm Barges office and left as voicemail.

## 2016-10-25 ENCOUNTER — Telehealth: Payer: Self-pay | Admitting: *Deleted

## 2016-10-25 NOTE — Telephone Encounter (Signed)
Stephannie Li from [facility] called to report that patient was negative for acute DVT

## 2016-10-25 NOTE — Telephone Encounter (Signed)
Thank you, please contact pt/daughter to let them know he may stop the xarelto. If he has xarelto left, I would just continue until the bottle is empty.

## 2016-10-26 DIAGNOSIS — M546 Pain in thoracic spine: Secondary | ICD-10-CM | POA: Diagnosis not present

## 2016-10-26 DIAGNOSIS — S22000A Wedge compression fracture of unspecified thoracic vertebra, initial encounter for closed fracture: Secondary | ICD-10-CM | POA: Diagnosis not present

## 2016-10-26 NOTE — Telephone Encounter (Signed)
Message was relayed to patients daughter and they understand the message.

## 2016-11-16 DIAGNOSIS — M546 Pain in thoracic spine: Secondary | ICD-10-CM | POA: Diagnosis not present

## 2016-11-19 IMAGING — CR DG ABDOMEN 1V
2 series · 2 of 2 positions shown · non-contrast
Comparison: CT scan 08/19/2011

CLINICAL DATA: Nephrolithiasis

EXAM:
ABDOMEN - 1 VIEW

[t abdomen supine (1 of 2)]
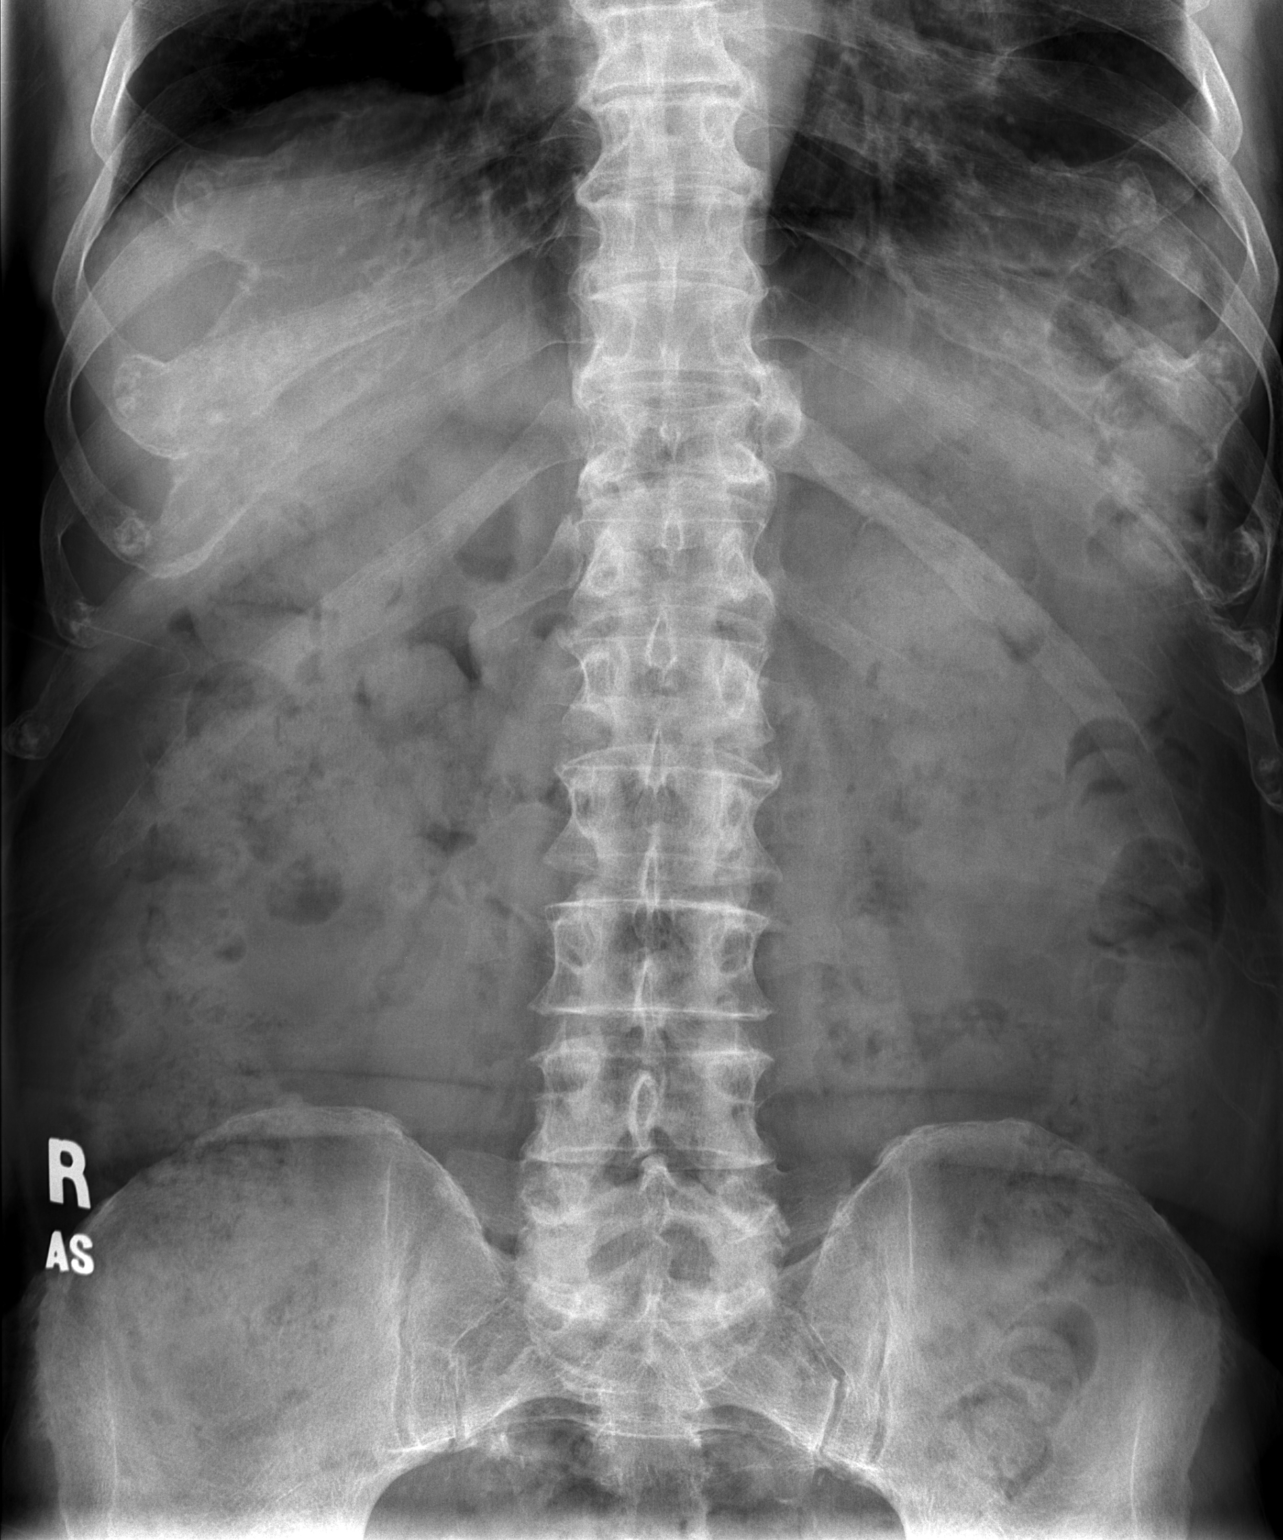

[t abdomen supine (2 of 2)]
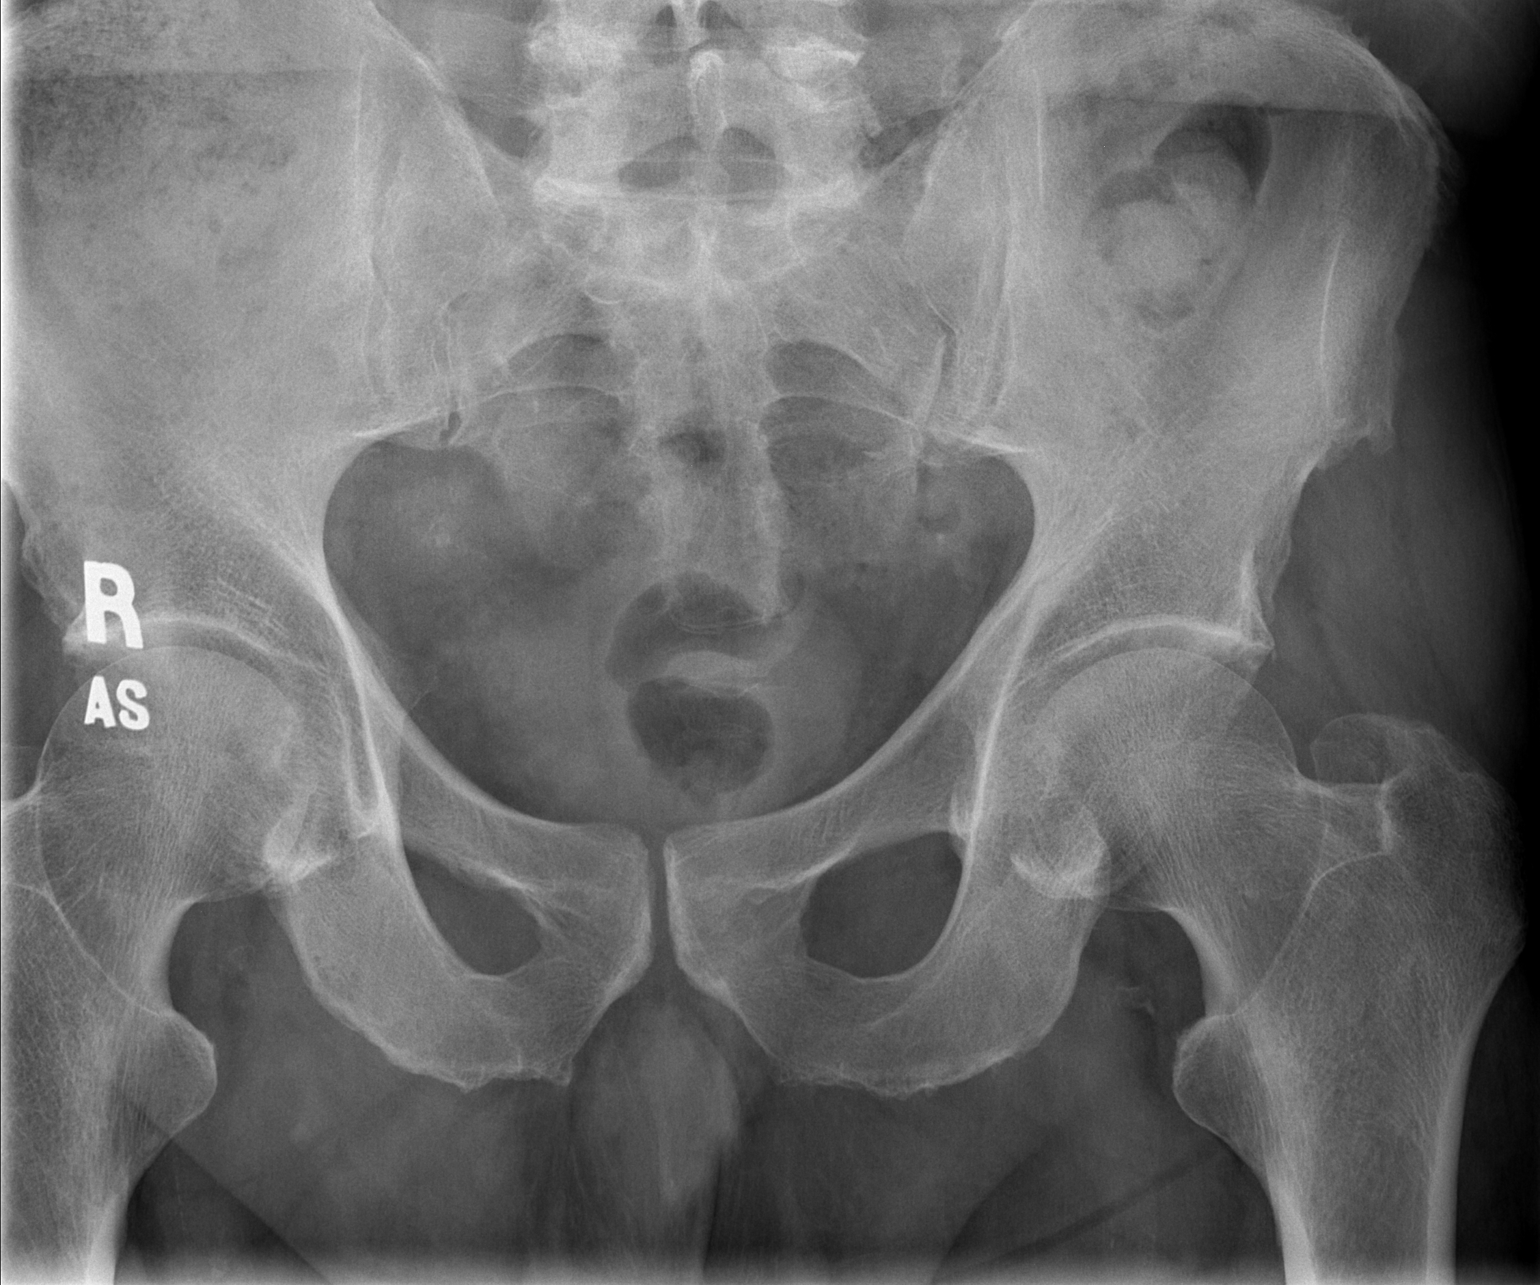

[2 of 2 positions shown; findings below may reference images not displayed]

FINDINGS: There is normal small bowel gas pattern. Moderate stool noted
throughout the colon. The kidneys are obscured by colonic stool. No
definite renal or ureteral calcifications are noted.
IMPRESSION: Normal small bowel gas pattern. No definite renal or ureteral
calcifications are noted. Moderate stool throughout the colon.

## 2016-11-29 ENCOUNTER — Ambulatory Visit: Payer: Medicare Other | Admitting: Physical Therapy

## 2016-12-13 ENCOUNTER — Encounter: Payer: Medicare Other | Attending: Physical Medicine & Rehabilitation | Admitting: Physical Medicine & Rehabilitation

## 2016-12-13 DIAGNOSIS — M0609 Rheumatoid arthritis without rheumatoid factor, multiple sites: Secondary | ICD-10-CM | POA: Insufficient documentation

## 2016-12-13 DIAGNOSIS — Z803 Family history of malignant neoplasm of breast: Secondary | ICD-10-CM | POA: Insufficient documentation

## 2016-12-13 DIAGNOSIS — Z87442 Personal history of urinary calculi: Secondary | ICD-10-CM | POA: Insufficient documentation

## 2016-12-13 DIAGNOSIS — Z86718 Personal history of other venous thrombosis and embolism: Secondary | ICD-10-CM | POA: Insufficient documentation

## 2016-12-13 DIAGNOSIS — K219 Gastro-esophageal reflux disease without esophagitis: Secondary | ICD-10-CM | POA: Insufficient documentation

## 2016-12-13 DIAGNOSIS — R52 Pain, unspecified: Secondary | ICD-10-CM | POA: Insufficient documentation

## 2016-12-13 DIAGNOSIS — R251 Tremor, unspecified: Secondary | ICD-10-CM | POA: Insufficient documentation

## 2016-12-13 DIAGNOSIS — H9190 Unspecified hearing loss, unspecified ear: Secondary | ICD-10-CM | POA: Insufficient documentation

## 2016-12-13 DIAGNOSIS — Z823 Family history of stroke: Secondary | ICD-10-CM | POA: Insufficient documentation

## 2016-12-13 DIAGNOSIS — S2341XD Sprain of ribs, subsequent encounter: Secondary | ICD-10-CM | POA: Insufficient documentation

## 2016-12-13 DIAGNOSIS — S22089D Unspecified fracture of T11-T12 vertebra, subsequent encounter for fracture with routine healing: Secondary | ICD-10-CM | POA: Insufficient documentation

## 2016-12-13 DIAGNOSIS — R531 Weakness: Secondary | ICD-10-CM | POA: Insufficient documentation

## 2016-12-13 DIAGNOSIS — N400 Enlarged prostate without lower urinary tract symptoms: Secondary | ICD-10-CM | POA: Insufficient documentation

## 2016-12-13 DIAGNOSIS — Z87891 Personal history of nicotine dependence: Secondary | ICD-10-CM | POA: Insufficient documentation

## 2016-12-13 DIAGNOSIS — K5900 Constipation, unspecified: Secondary | ICD-10-CM | POA: Insufficient documentation

## 2016-12-13 DIAGNOSIS — S22079D Unspecified fracture of T9-T10 vertebra, subsequent encounter for fracture with routine healing: Secondary | ICD-10-CM | POA: Insufficient documentation

## 2016-12-13 DIAGNOSIS — Z833 Family history of diabetes mellitus: Secondary | ICD-10-CM | POA: Insufficient documentation

## 2016-12-13 DIAGNOSIS — F329 Major depressive disorder, single episode, unspecified: Secondary | ICD-10-CM | POA: Insufficient documentation

## 2016-12-13 DIAGNOSIS — I10 Essential (primary) hypertension: Secondary | ICD-10-CM | POA: Insufficient documentation

## 2016-12-13 DIAGNOSIS — Z7901 Long term (current) use of anticoagulants: Secondary | ICD-10-CM | POA: Insufficient documentation

## 2016-12-21 DIAGNOSIS — S22000A Wedge compression fracture of unspecified thoracic vertebra, initial encounter for closed fracture: Secondary | ICD-10-CM | POA: Diagnosis not present

## 2017-01-10 DIAGNOSIS — I10 Essential (primary) hypertension: Secondary | ICD-10-CM | POA: Diagnosis not present

## 2017-01-10 DIAGNOSIS — N3941 Urge incontinence: Secondary | ICD-10-CM | POA: Diagnosis not present

## 2017-01-20 DIAGNOSIS — Q253 Supravalvular aortic stenosis: Secondary | ICD-10-CM | POA: Diagnosis not present

## 2017-01-20 DIAGNOSIS — R7301 Impaired fasting glucose: Secondary | ICD-10-CM | POA: Diagnosis not present

## 2017-01-20 DIAGNOSIS — G3184 Mild cognitive impairment, so stated: Secondary | ICD-10-CM | POA: Diagnosis not present

## 2017-01-20 DIAGNOSIS — Z79899 Other long term (current) drug therapy: Secondary | ICD-10-CM | POA: Diagnosis not present

## 2017-01-20 DIAGNOSIS — I10 Essential (primary) hypertension: Secondary | ICD-10-CM | POA: Diagnosis not present

## 2017-01-20 DIAGNOSIS — M069 Rheumatoid arthritis, unspecified: Secondary | ICD-10-CM | POA: Diagnosis not present

## 2017-01-20 DIAGNOSIS — E7800 Pure hypercholesterolemia, unspecified: Secondary | ICD-10-CM | POA: Diagnosis not present

## 2017-01-20 DIAGNOSIS — Z0000 Encounter for general adult medical examination without abnormal findings: Secondary | ICD-10-CM | POA: Diagnosis not present

## 2017-01-20 DIAGNOSIS — F325 Major depressive disorder, single episode, in full remission: Secondary | ICD-10-CM | POA: Diagnosis not present

## 2017-02-20 DIAGNOSIS — Z79899 Other long term (current) drug therapy: Secondary | ICD-10-CM | POA: Diagnosis not present

## 2017-02-20 DIAGNOSIS — M7581 Other shoulder lesions, right shoulder: Secondary | ICD-10-CM | POA: Diagnosis not present

## 2017-02-20 DIAGNOSIS — M0609 Rheumatoid arthritis without rheumatoid factor, multiple sites: Secondary | ICD-10-CM | POA: Diagnosis not present

## 2017-02-20 DIAGNOSIS — M25511 Pain in right shoulder: Secondary | ICD-10-CM | POA: Diagnosis not present

## 2017-02-21 DIAGNOSIS — N401 Enlarged prostate with lower urinary tract symptoms: Secondary | ICD-10-CM | POA: Diagnosis not present

## 2017-02-21 DIAGNOSIS — N289 Disorder of kidney and ureter, unspecified: Secondary | ICD-10-CM | POA: Diagnosis not present

## 2017-02-21 DIAGNOSIS — R351 Nocturia: Secondary | ICD-10-CM | POA: Diagnosis not present

## 2017-03-13 DIAGNOSIS — R0781 Pleurodynia: Secondary | ICD-10-CM | POA: Diagnosis not present

## 2017-03-13 DIAGNOSIS — H9201 Otalgia, right ear: Secondary | ICD-10-CM | POA: Diagnosis not present

## 2017-03-13 DIAGNOSIS — R109 Unspecified abdominal pain: Secondary | ICD-10-CM | POA: Diagnosis not present

## 2017-03-13 DIAGNOSIS — Z79899 Other long term (current) drug therapy: Secondary | ICD-10-CM | POA: Diagnosis not present

## 2017-03-15 ENCOUNTER — Ambulatory Visit
Admission: RE | Admit: 2017-03-15 | Discharge: 2017-03-15 | Disposition: A | Discharge status: Home / Self Care | Payer: Medicare Other | Source: Ambulatory Visit | Attending: Geriatric Medicine | Admitting: Geriatric Medicine

## 2017-03-15 ENCOUNTER — Other Ambulatory Visit: Payer: Self-pay | Admitting: Geriatric Medicine

## 2017-03-15 DIAGNOSIS — R0781 Pleurodynia: Secondary | ICD-10-CM

## 2017-03-17 ENCOUNTER — Other Ambulatory Visit: Payer: Self-pay | Admitting: Geriatric Medicine

## 2017-03-17 DIAGNOSIS — R109 Unspecified abdominal pain: Secondary | ICD-10-CM

## 2017-03-23 ENCOUNTER — Ambulatory Visit
Admission: RE | Admit: 2017-03-23 | Discharge: 2017-03-23 | Disposition: A | Discharge status: Home / Self Care | Payer: Medicare Other | Source: Ambulatory Visit | Attending: Geriatric Medicine | Admitting: Geriatric Medicine

## 2017-03-23 DIAGNOSIS — N200 Calculus of kidney: Secondary | ICD-10-CM | POA: Diagnosis not present

## 2017-03-23 DIAGNOSIS — R109 Unspecified abdominal pain: Secondary | ICD-10-CM

## 2017-03-23 DIAGNOSIS — K4090 Unilateral inguinal hernia, without obstruction or gangrene, not specified as recurrent: Secondary | ICD-10-CM | POA: Diagnosis not present

## 2017-03-29 DIAGNOSIS — Z23 Encounter for immunization: Secondary | ICD-10-CM | POA: Diagnosis not present

## 2017-06-01 DIAGNOSIS — M0609 Rheumatoid arthritis without rheumatoid factor, multiple sites: Secondary | ICD-10-CM | POA: Diagnosis not present

## 2017-06-01 DIAGNOSIS — M7061 Trochanteric bursitis, right hip: Secondary | ICD-10-CM | POA: Diagnosis not present

## 2017-06-01 DIAGNOSIS — M1990 Unspecified osteoarthritis, unspecified site: Secondary | ICD-10-CM | POA: Diagnosis not present

## 2017-06-01 DIAGNOSIS — Z79899 Other long term (current) drug therapy: Secondary | ICD-10-CM | POA: Diagnosis not present

## 2017-06-01 DIAGNOSIS — M25552 Pain in left hip: Secondary | ICD-10-CM | POA: Diagnosis not present

## 2017-06-01 DIAGNOSIS — M25551 Pain in right hip: Secondary | ICD-10-CM | POA: Diagnosis not present

## 2017-06-01 DIAGNOSIS — M7581 Other shoulder lesions, right shoulder: Secondary | ICD-10-CM | POA: Diagnosis not present

## 2017-06-08 DIAGNOSIS — M069 Rheumatoid arthritis, unspecified: Secondary | ICD-10-CM | POA: Diagnosis not present

## 2017-06-08 DIAGNOSIS — I10 Essential (primary) hypertension: Secondary | ICD-10-CM | POA: Diagnosis not present

## 2017-06-08 DIAGNOSIS — F325 Major depressive disorder, single episode, in full remission: Secondary | ICD-10-CM | POA: Diagnosis not present

## 2017-06-08 DIAGNOSIS — N400 Enlarged prostate without lower urinary tract symptoms: Secondary | ICD-10-CM | POA: Diagnosis not present

## 2017-07-28 DIAGNOSIS — B353 Tinea pedis: Secondary | ICD-10-CM | POA: Diagnosis not present

## 2017-07-28 DIAGNOSIS — M542 Cervicalgia: Secondary | ICD-10-CM | POA: Diagnosis not present

## 2017-07-28 DIAGNOSIS — I10 Essential (primary) hypertension: Secondary | ICD-10-CM | POA: Diagnosis not present

## 2017-10-29 IMAGING — CR DG THORACIC SPINE 2V
2 series · 2 of 2 positions shown · non-contrast
Comparison: 08/24/2016, 08/13/2016

CLINICAL DATA: T10 vertebral fracture

EXAM:
THORACIC SPINE 2 VIEWS

[t-spine ap]
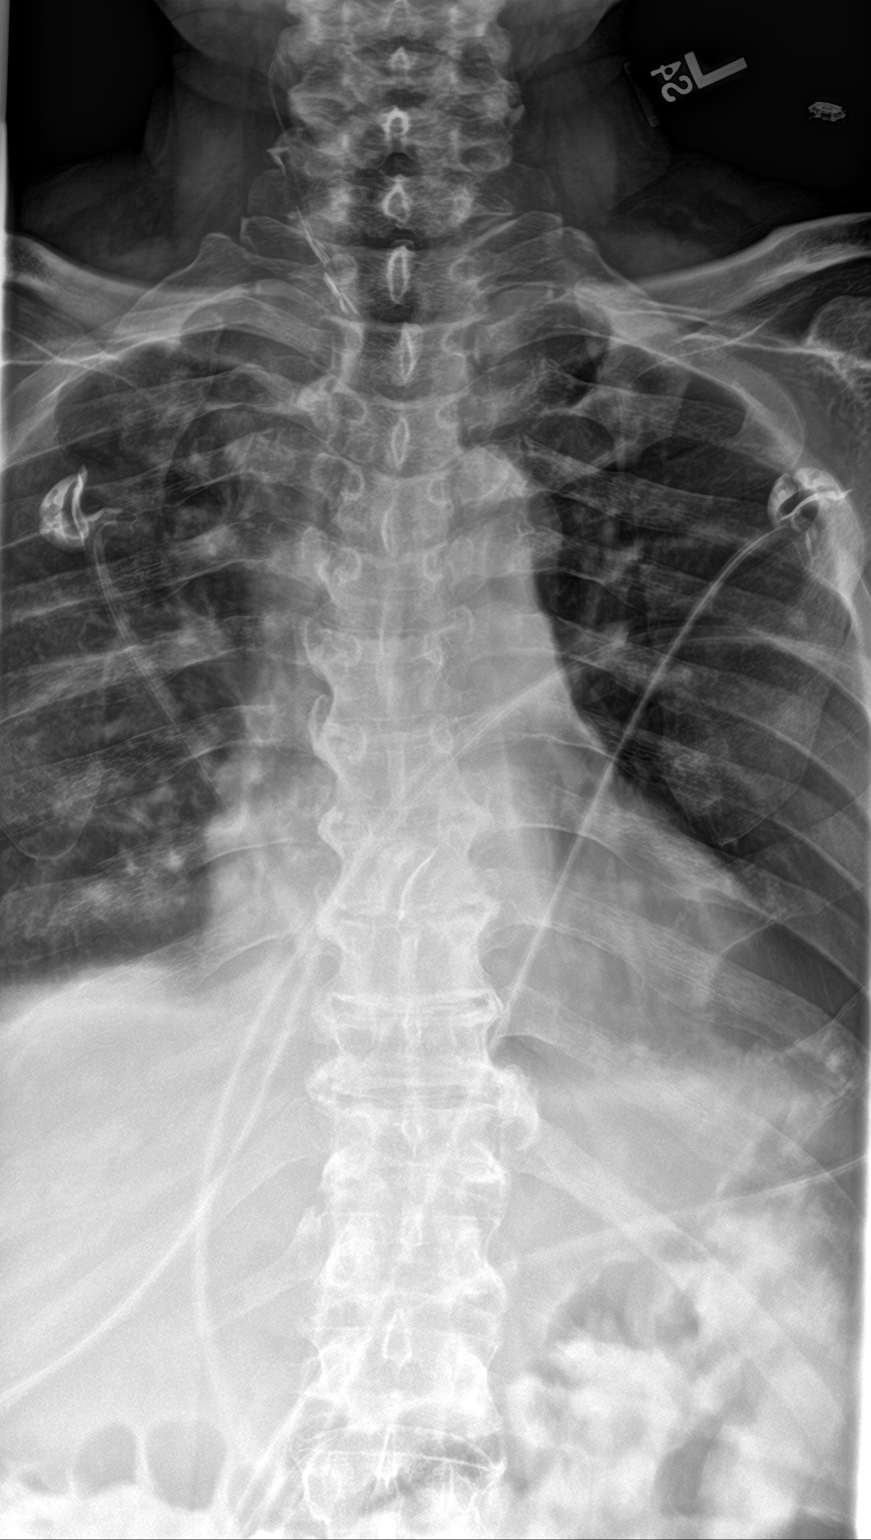

[t-spine lat]
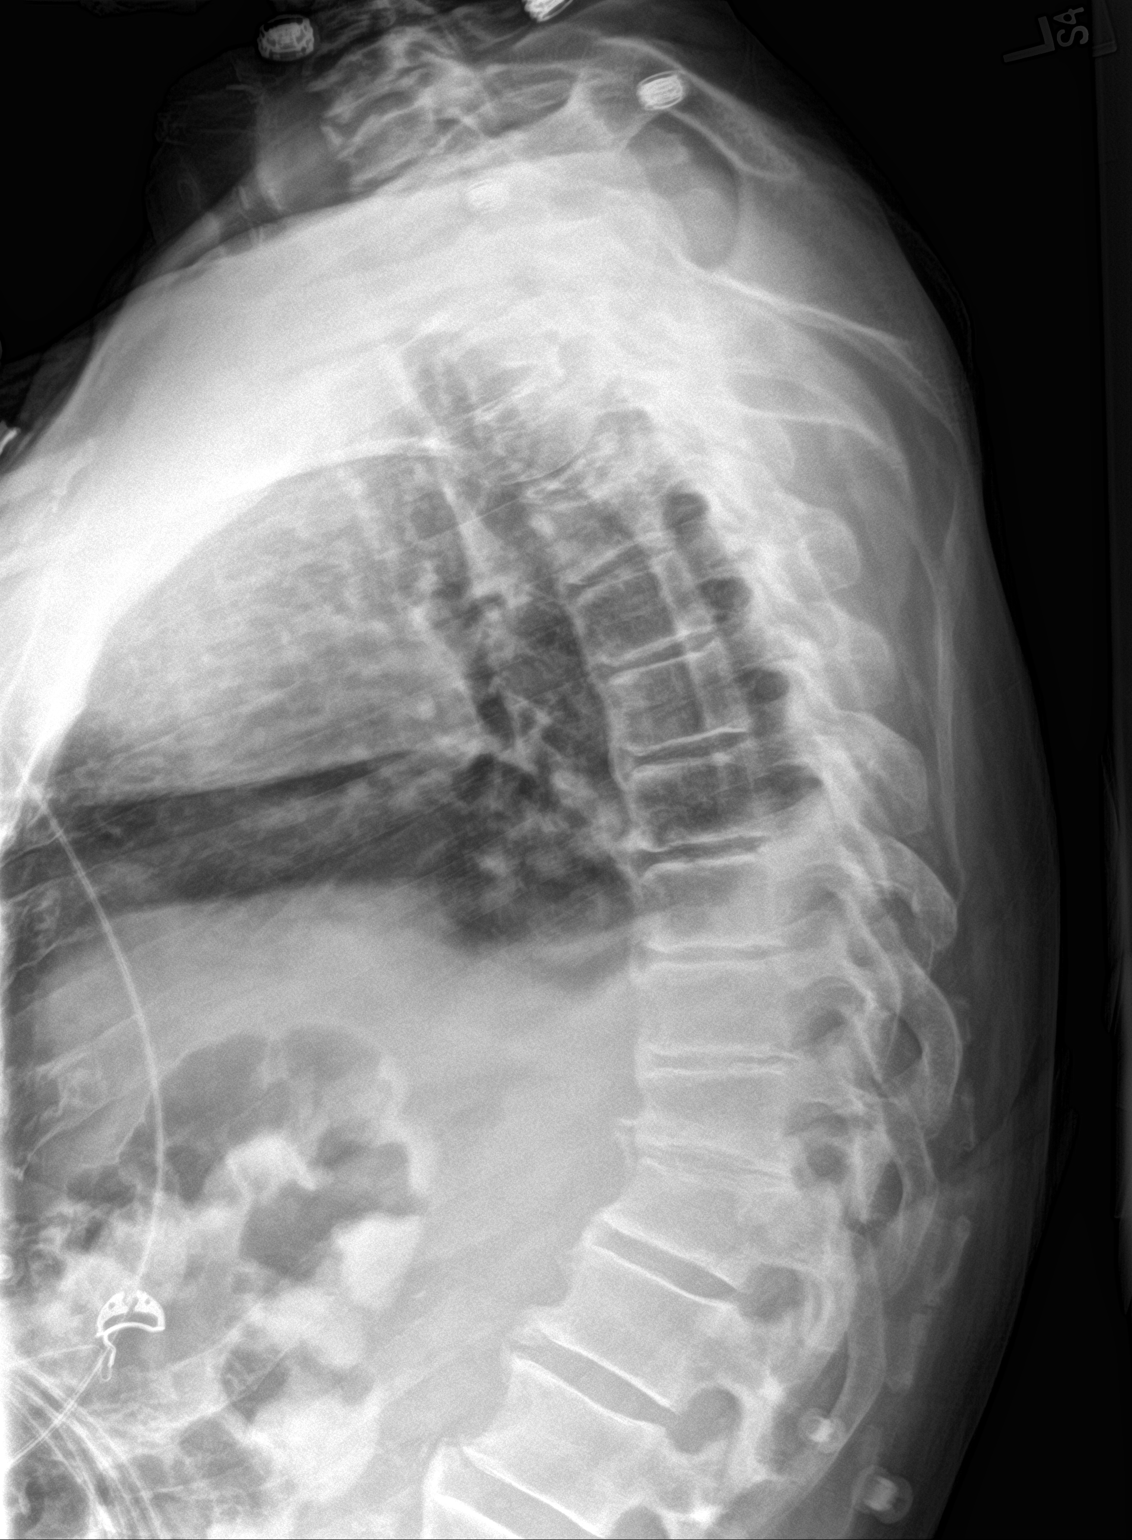

[2 of 2 positions shown; findings below may reference images not displayed]

FINDINGS: Mild loss of height of T10 and T11 vertebral bodies, stable compared
with MRI it T11, possibly slightly progressed at T10. Mild kyphosis
at T10-T11. Posterior extent of the fractures at T10 and 11 is
better seen on the cross-sectional images. Multilevel degenerative
osteophytes. Remaining vertebral bodies demonstrate normal stature.
IMPRESSION: Minimal kyphosis at T10-T11 with mild loss of the vertebral body
heights at these levels.

## 2017-11-20 DIAGNOSIS — L821 Other seborrheic keratosis: Secondary | ICD-10-CM | POA: Diagnosis not present

## 2017-11-20 DIAGNOSIS — L309 Dermatitis, unspecified: Secondary | ICD-10-CM | POA: Diagnosis not present

## 2017-11-20 DIAGNOSIS — B351 Tinea unguium: Secondary | ICD-10-CM | POA: Diagnosis not present

## 2017-11-21 DIAGNOSIS — B359 Dermatophytosis, unspecified: Secondary | ICD-10-CM | POA: Diagnosis not present

## 2017-11-27 DIAGNOSIS — R05 Cough: Secondary | ICD-10-CM | POA: Diagnosis not present

## 2017-11-27 DIAGNOSIS — J301 Allergic rhinitis due to pollen: Secondary | ICD-10-CM | POA: Diagnosis not present

## 2018-01-23 DIAGNOSIS — E875 Hyperkalemia: Secondary | ICD-10-CM | POA: Diagnosis not present

## 2018-01-23 DIAGNOSIS — M1990 Unspecified osteoarthritis, unspecified site: Secondary | ICD-10-CM | POA: Diagnosis not present

## 2018-01-23 DIAGNOSIS — M0609 Rheumatoid arthritis without rheumatoid factor, multiple sites: Secondary | ICD-10-CM | POA: Diagnosis not present

## 2018-01-23 DIAGNOSIS — Z79899 Other long term (current) drug therapy: Secondary | ICD-10-CM | POA: Diagnosis not present

## 2018-02-04 ENCOUNTER — Inpatient Hospital Stay (HOSPITAL_COMMUNITY): Payer: Medicare Other

## 2018-02-04 ENCOUNTER — Inpatient Hospital Stay (HOSPITAL_COMMUNITY)
Admission: EM | Admit: 2018-02-04 | Discharge: 2018-02-07 | DRG: 286 | Disposition: A | Discharge status: Home Health | Payer: Medicare Other | Attending: Family Medicine | Admitting: Family Medicine

## 2018-02-04 ENCOUNTER — Emergency Department (HOSPITAL_COMMUNITY): Payer: Medicare Other

## 2018-02-04 ENCOUNTER — Encounter (HOSPITAL_COMMUNITY): Payer: Self-pay

## 2018-02-04 ENCOUNTER — Other Ambulatory Visit: Payer: Self-pay

## 2018-02-04 DIAGNOSIS — Z888 Allergy status to other drugs, medicaments and biological substances status: Secondary | ICD-10-CM | POA: Diagnosis not present

## 2018-02-04 DIAGNOSIS — R079 Chest pain, unspecified: Secondary | ICD-10-CM | POA: Diagnosis not present

## 2018-02-04 DIAGNOSIS — N179 Acute kidney failure, unspecified: Secondary | ICD-10-CM | POA: Diagnosis present

## 2018-02-04 DIAGNOSIS — K5730 Diverticulosis of large intestine without perforation or abscess without bleeding: Secondary | ICD-10-CM | POA: Diagnosis not present

## 2018-02-04 DIAGNOSIS — R109 Unspecified abdominal pain: Secondary | ICD-10-CM

## 2018-02-04 DIAGNOSIS — I25119 Atherosclerotic heart disease of native coronary artery with unspecified angina pectoris: Secondary | ICD-10-CM | POA: Diagnosis present

## 2018-02-04 DIAGNOSIS — R140 Abdominal distension (gaseous): Secondary | ICD-10-CM | POA: Diagnosis not present

## 2018-02-04 DIAGNOSIS — R739 Hyperglycemia, unspecified: Secondary | ICD-10-CM | POA: Diagnosis present

## 2018-02-04 DIAGNOSIS — R001 Bradycardia, unspecified: Secondary | ICD-10-CM | POA: Diagnosis not present

## 2018-02-04 DIAGNOSIS — R0789 Other chest pain: Secondary | ICD-10-CM | POA: Diagnosis present

## 2018-02-04 DIAGNOSIS — Z87891 Personal history of nicotine dependence: Secondary | ICD-10-CM

## 2018-02-04 DIAGNOSIS — R1310 Dysphagia, unspecified: Secondary | ICD-10-CM | POA: Diagnosis not present

## 2018-02-04 DIAGNOSIS — I10 Essential (primary) hypertension: Secondary | ICD-10-CM | POA: Diagnosis not present

## 2018-02-04 DIAGNOSIS — I110 Hypertensive heart disease with heart failure: Secondary | ICD-10-CM | POA: Diagnosis present

## 2018-02-04 DIAGNOSIS — N281 Cyst of kidney, acquired: Secondary | ICD-10-CM | POA: Diagnosis not present

## 2018-02-04 DIAGNOSIS — R7401 Elevation of levels of liver transaminase levels: Secondary | ICD-10-CM

## 2018-02-04 DIAGNOSIS — E785 Hyperlipidemia, unspecified: Secondary | ICD-10-CM | POA: Diagnosis present

## 2018-02-04 DIAGNOSIS — R945 Abnormal results of liver function studies: Secondary | ICD-10-CM | POA: Diagnosis present

## 2018-02-04 DIAGNOSIS — M069 Rheumatoid arthritis, unspecified: Secondary | ICD-10-CM | POA: Diagnosis not present

## 2018-02-04 DIAGNOSIS — K219 Gastro-esophageal reflux disease without esophagitis: Secondary | ICD-10-CM | POA: Diagnosis present

## 2018-02-04 DIAGNOSIS — H9190 Unspecified hearing loss, unspecified ear: Secondary | ICD-10-CM | POA: Diagnosis present

## 2018-02-04 DIAGNOSIS — R0602 Shortness of breath: Secondary | ICD-10-CM | POA: Diagnosis not present

## 2018-02-04 DIAGNOSIS — R740 Nonspecific elevation of levels of transaminase and lactic acid dehydrogenase [LDH]: Secondary | ICD-10-CM | POA: Diagnosis not present

## 2018-02-04 DIAGNOSIS — I5033 Acute on chronic diastolic (congestive) heart failure: Secondary | ICD-10-CM | POA: Diagnosis not present

## 2018-02-04 DIAGNOSIS — I959 Hypotension, unspecified: Secondary | ICD-10-CM | POA: Diagnosis not present

## 2018-02-04 DIAGNOSIS — I350 Nonrheumatic aortic (valve) stenosis: Secondary | ICD-10-CM | POA: Diagnosis not present

## 2018-02-04 DIAGNOSIS — Z954 Presence of other heart-valve replacement: Secondary | ICD-10-CM | POA: Diagnosis not present

## 2018-02-04 DIAGNOSIS — R9431 Abnormal electrocardiogram [ECG] [EKG]: Secondary | ICD-10-CM | POA: Diagnosis not present

## 2018-02-04 DIAGNOSIS — N400 Enlarged prostate without lower urinary tract symptoms: Secondary | ICD-10-CM | POA: Diagnosis present

## 2018-02-04 DIAGNOSIS — I361 Nonrheumatic tricuspid (valve) insufficiency: Secondary | ICD-10-CM | POA: Diagnosis not present

## 2018-02-04 DIAGNOSIS — R911 Solitary pulmonary nodule: Secondary | ICD-10-CM | POA: Diagnosis present

## 2018-02-04 DIAGNOSIS — Z79899 Other long term (current) drug therapy: Secondary | ICD-10-CM

## 2018-02-04 HISTORY — DX: Nonrheumatic aortic (valve) stenosis: I35.0

## 2018-02-04 LAB — COMPREHENSIVE METABOLIC PANEL
ALBUMIN: 3.5 g/dL (ref 3.5–5.0)
ALT: 424 U/L — ABNORMAL HIGH (ref 0–44)
AST: 472 U/L — AB (ref 15–41)
Alkaline Phosphatase: 100 U/L (ref 38–126)
Anion gap: 11 (ref 5–15)
BUN: 16 mg/dL (ref 8–23)
CHLORIDE: 102 mmol/L (ref 98–111)
CO2: 24 mmol/L (ref 22–32)
Calcium: 8.5 mg/dL — ABNORMAL LOW (ref 8.9–10.3)
Creatinine, Ser: 1.25 mg/dL — ABNORMAL HIGH (ref 0.61–1.24)
GFR calc Af Amer: 58 mL/min — ABNORMAL LOW (ref >60)
GFR calc non Af Amer: 50 mL/min — ABNORMAL LOW (ref >60)
GLUCOSE: 170 mg/dL — AB (ref 70–99)
Potassium: 4.1 mmol/L (ref 3.5–5.1)
SODIUM: 137 mmol/L (ref 135–145)
Total Bilirubin: 3.1 mg/dL — ABNORMAL HIGH (ref 0.3–1.2)
Total Protein: 6.3 g/dL — ABNORMAL LOW (ref 6.5–8.1)

## 2018-02-04 LAB — CBC WITH DIFFERENTIAL/PLATELET
Abs Immature Granulocytes: 0 10*3/uL (ref 0.0–0.1)
BASOS ABS: 0 10*3/uL (ref 0.0–0.1)
Basophils Relative: 0 %
Eosinophils Absolute: 0.1 10*3/uL (ref 0.0–0.7)
Eosinophils Relative: 1 %
HEMATOCRIT: 38.4 % — AB (ref 39.0–52.0)
HEMOGLOBIN: 12.3 g/dL — AB (ref 13.0–17.0)
IMMATURE GRANULOCYTES: 0 %
LYMPHS ABS: 0.6 10*3/uL — AB (ref 0.7–4.0)
LYMPHS PCT: 7 %
MCH: 29.6 pg (ref 26.0–34.0)
MCHC: 32 g/dL (ref 30.0–36.0)
MCV: 92.3 fL (ref 78.0–100.0)
Monocytes Absolute: 0.6 10*3/uL (ref 0.1–1.0)
Monocytes Relative: 7 %
NEUTROS PCT: 85 %
Neutro Abs: 8.2 10*3/uL — ABNORMAL HIGH (ref 1.7–7.7)
Platelets: 148 10*3/uL — ABNORMAL LOW (ref 150–400)
RBC: 4.16 MIL/uL — ABNORMAL LOW (ref 4.22–5.81)
RDW: 11.6 % (ref 11.5–15.5)
WBC: 9.6 10*3/uL (ref 4.0–10.5)

## 2018-02-04 LAB — CREATININE, SERUM
CREATININE: 1.36 mg/dL — AB (ref 0.61–1.24)
GFR calc Af Amer: 52 mL/min — ABNORMAL LOW (ref >60)
GFR calc non Af Amer: 45 mL/min — ABNORMAL LOW (ref >60)

## 2018-02-04 LAB — HEMOGLOBIN A1C
Hgb A1c MFr Bld: 5.1 % (ref 4.8–5.6)
MEAN PLASMA GLUCOSE: 99.67 mg/dL

## 2018-02-04 LAB — CBC
HCT: 39.9 % (ref 39.0–52.0)
HEMOGLOBIN: 12.7 g/dL — AB (ref 13.0–17.0)
MCH: 29.3 pg (ref 26.0–34.0)
MCHC: 31.8 g/dL (ref 30.0–36.0)
MCV: 92.1 fL (ref 78.0–100.0)
Platelets: 143 10*3/uL — ABNORMAL LOW (ref 150–400)
RBC: 4.33 MIL/uL (ref 4.22–5.81)
RDW: 11.7 % (ref 11.5–15.5)
WBC: 8.9 10*3/uL (ref 4.0–10.5)

## 2018-02-04 LAB — TROPONIN I
Troponin I: <0.03 ng/mL (ref <0.03)
Troponin I: <0.03 ng/mL (ref <0.03)

## 2018-02-04 LAB — MAGNESIUM: Magnesium: 1.8 mg/dL (ref 1.7–2.4)

## 2018-02-04 LAB — PROTIME-INR
INR: 1.14
PROTHROMBIN TIME: 14.6 s (ref 11.4–15.2)

## 2018-02-04 LAB — CBG MONITORING, ED: GLUCOSE-CAPILLARY: 167 mg/dL — AB (ref 70–99)

## 2018-02-04 LAB — GLUCOSE, CAPILLARY
Glucose-Capillary: 102 mg/dL — ABNORMAL HIGH (ref 70–99)
Glucose-Capillary: 121 mg/dL — ABNORMAL HIGH (ref 70–99)

## 2018-02-04 LAB — BRAIN NATRIURETIC PEPTIDE: B NATRIURETIC PEPTIDE 5: 380 pg/mL — AB (ref 0.0–100.0)

## 2018-02-04 LAB — TSH: TSH: 5.467 u[IU]/mL — AB (ref 0.350–4.500)

## 2018-02-04 MED ORDER — ASPIRIN 81 MG PO CHEW: 324.0000 mg | CHEWABLE_TABLET | Freq: Once | ORAL | Status: DC

## 2018-02-04 MED ORDER — ESCITALOPRAM OXALATE 10 MG PO TABS
20.0000 mg | ORAL_TABLET | Freq: Every day | ORAL | Status: DC
  Administered 2018-02-04 – 2018-02-06 (×3): 20 mg via ORAL
  Filled 2018-02-04 (×3): qty 2

## 2018-02-04 MED ORDER — ONDANSETRON HCL 4 MG/2ML IJ SOLN: 4.0000 mg | Freq: Four times a day (QID) | INTRAMUSCULAR | Status: DC | PRN

## 2018-02-04 MED ORDER — ONDANSETRON HCL 4 MG PO TABS: 4.0000 mg | ORAL_TABLET | Freq: Four times a day (QID) | ORAL | Status: DC | PRN

## 2018-02-04 MED ORDER — ENOXAPARIN SODIUM 40 MG/0.4ML ~~LOC~~ SOLN
40.0000 mg | SUBCUTANEOUS | Status: DC
  Administered 2018-02-04 – 2018-02-05 (×2): 40 mg via SUBCUTANEOUS
  Filled 2018-02-04 (×2): qty 0.4

## 2018-02-04 MED ORDER — DOCUSATE SODIUM 100 MG PO CAPS: 100.0000 mg | ORAL_CAPSULE | Freq: Two times a day (BID) | ORAL | Status: DC

## 2018-02-04 MED ORDER — ASPIRIN EC 81 MG PO TBEC
81.0000 mg | DELAYED_RELEASE_TABLET | Freq: Every day | ORAL | Status: DC
  Administered 2018-02-05: 81 mg via ORAL
  Filled 2018-02-04: qty 1

## 2018-02-04 MED ORDER — METHOTREXATE 2.5 MG PO TABS: 22.5000 mg | ORAL_TABLET | ORAL | Status: DC

## 2018-02-04 MED ORDER — OXYCODONE HCL 5 MG PO TABS
5.0000 mg | ORAL_TABLET | ORAL | Status: DC | PRN
  Filled 2018-02-04: qty 1

## 2018-02-04 MED ORDER — FOLIC ACID 1 MG PO TABS
1.0000 mg | ORAL_TABLET | Freq: Every day | ORAL | Status: DC
  Administered 2018-02-04 – 2018-02-07 (×4): 1 mg via ORAL
  Filled 2018-02-04 (×4): qty 1

## 2018-02-04 MED ORDER — ESCITALOPRAM OXALATE 10 MG PO TABS: 20.0000 mg | ORAL_TABLET | Freq: Every day | ORAL | Status: DC

## 2018-02-04 MED ORDER — FUROSEMIDE 10 MG/ML IJ SOLN
20.0000 mg | Freq: Once | INTRAMUSCULAR | Status: AC
  Administered 2018-02-04: 20 mg via INTRAVENOUS
  Filled 2018-02-04: qty 2

## 2018-02-04 NOTE — H&P (Signed)
History and Physical    [name] HRC:[id number] DOB: 10-08-1927 DOA: 02/04/2018  PCP: Lajean Manes, MD  Patient coming from: home  Chief Complaint: Chest pain, dyspnea  HPI: [name] is a 84 y.o. male with medical history significant of rheumatoid arthritis, aortic stenosis (last TTE in March showed a peak velocity of 4.51m/s), GERD, HTN, BPH, hepatitis A as a child who presents for dyspnea and chest pressure.  Patient is [place] speaking, he understands some [place] and speaks some [place].  Daughter [name] translated for me.  Patient reports that over the last 2 days he had been having left arm pain from the shoulder to the arm.  Last night, he developed SOB and felt that his chest was heavy and was causing him to not be able to breath, he described as a "stone lying on his chest."  The chest heaviness was over the left chest and came on at rest.  He further had bloating and nausea and emesis X 1 this morning after water.  He reports that his BP was very high this morning at 195 SBP and he took 2 pills of losartan and he started to feel better.  For the nausea, he took zofran and this also helped.  He took 4 baby aspirin this morning and the chest pressure did improve.  Further symptoms include swimmy-headedness and unsteadiness and inability to stand up since last night as he felt he would fall if he stood up.  His last BM was yesterday.  Of note, he is concerned that he has had food poisoning from some poor seafood (shrimp).  His family reports that he does not check his diet and eats whatever salt and fluid he wants.  He has a history of dysphagia and is supposed to take small portions, which he also does not do.    His last TTE in March had difficulty seeing the aortic valve, but did record a peak valve velocity of 422cm/s (4.56m/s) which would place him in the severe AS category.    ED Course: In the ED, he received morphine and aspirin with improvement in his chest pressure.  He had blood  work which showed a Troponin of < 0.03, BNP of 380 (up from 98 in March) Cr of 1.25 (up from 1.19 in March), elevated LFTs ~ 10X ULN with an elevated tBili of 3.1.  He has a mild anemia, but normal WBC and Platelets were 148.  He had a chest xray which showed no active cardiopulmonary disease and a stable pulmonary nodule.  EKG with NSR and no apparent ST elevation (possibly lone j point elevation in lead V2).  He has a low HR in the 50s, not on beta blockade, and BP has been on the low side after he took 2 valsartan at home.   Review of Systems: As per HPI otherwise 10 point review of systems negative.    Past Medical History:  Diagnosis Date  . At risk for sleep apnea    STOP-BANG= 4    SENT TO PCP 04-04-2014  . BPH (benign prostatic hypertrophy)   . Depression   . Diverticulosis of colon   . GERD (gastroesophageal reflux disease)   . Gilbert's syndrome   . Headache    migraines  . Hearing loss   . Heart murmur    "Calcified aortic valve"  . Hepatitis    Hep A as a child  . History of gastroenteritis   . History of kidney stones   .  History of syncope    2002--  vasovagal  . HOH (hard of hearing)    refuses to wear his Hearing Aid  . Hypertension   . Inguinal hernia    right  . Internal hemorrhoid, bleeding   . Moderate aortic stenosis    a. Last echo 11/2013, 06/2014.  Marland Kitchen Nocturia   . Normal coronary arteries    a. Cath 07/2010: normal coronaries. Felt to have noncardiac CP/SOB at that time possibly r/t anxiety.  . RA (rheumatoid arthritis) ([place])    bilateral hands/ wrist--  seronegative  . Rotator cuff syndrome of right shoulder 08/01/2013  . Shortness of breath dyspnea    with exertion  . Stone, kidney   . Thyroid cyst   . Thyroid nodule    noted 11-2009  . Wears dentures     Past Surgical History:  Procedure Laterality Date  . CARDIAC CATHETERIZATION  07-19-2010  dr Tressia Miners turner   normal coronary arteries,  ef 60%,  moderate aortic stenosis- gradient 40mmHg, normal  right heart pressure  . CARDIOVASCULAR STRESS TEST  05/ 2011   dr Marlou Porch   normal low risk perfusion study  . CATARACT EXTRACTION W/ INTRAOCULAR LENS  IMPLANT, BILATERAL  2013  . COLONOSCOPY  2010  approx  . THYROID LOBECTOMY Right 05/05/2015   Procedure: RIGHT THYROID LOBECTOMY;  Surgeon: Armandina Gemma, MD;  Location: [facility];  Service: General;  Laterality: Right;  . TRANSANAL HEMORRHOIDAL DEARTERIALIZATION N/A 04/09/2014   Procedure: TRANSANAL HEMORRHOIDAL DEARTERIALIZATION OF INTERNAL HEMORROIDS;  Surgeon: Leighton Ruff, MD;  Location: [facility];  Service: General;  Laterality: N/A;  . TRANSTHORACIC ECHOCARDIOGRAM  11-26-2013   moderate focal basal and mild LVH/  ef 40-08%/  grade I diastolic dysfunction/ mild LAE/  moderate calcification with stenosis AV with mild regurg,  gradients 35 abd 58 mmHg /  mild TR   Reviewed with family  reports that he quit smoking about 36 years ago. His smoking use included cigarettes. He started smoking about 44 years ago. He quit after 40.00 years of use. He has never used smokeless tobacco. He reports that he drinks alcohol. He reports that he does not use drugs.  Allergies  Allergen Reactions  . Ativan [Lorazepam] Other (See Comments)    hallucinations  . Phenergan [Promethazine Hcl] Hypertension  . Citrus Rash    Small rash after eating for several days in a row  . Other Rash    Reaction to Honey   Reviewed Family History  Problem Relation Age of Onset  . Pulmonary embolism Mother 67       caused by complications of surgery  . CVA Father   . Diabetes Brother   . Diabetes Brother   . Breast cancer Sister      Prior to Admission medications   Medication Sig Start Date End Date Taking? Authorizing Provider  cyclobenzaprine (FLEXERIL) 5 MG tablet Take 1 tablet (5 mg total) by mouth 3 (three) times daily. 09/06/16   Angiulli, Lavon Paganini, PA-C  docusate sodium (COLACE) 100 MG capsule Take 1 capsule (100 mg total) by mouth 2 (two)  times daily. 09/06/16   Angiulli, Lavon Paganini, PA-C  escitalopram (LEXAPRO) 20 MG tablet Take 1 tablet (20 mg total) by mouth daily. 09/06/16   Angiulli, Lavon Paganini, PA-C  folic acid (FOLVITE) 1 MG tablet Take 1 mg by mouth daily.  01/27/15   [provider]         linaclotide Rolan Lipa) 145 MCG CAPS capsule  Take 1 capsule (145 mcg total) by mouth daily. 09/07/16   Angiulli, Lavon Paganini, PA-C  methotrexate (RHEUMATREX) 2.5 MG tablet Take 22.5 mg by mouth once a week. TAKE ON SUNDAYS 07/13/13   [provider]         polyethylene glycol (MIRALAX / GLYCOLAX) packet Take 17 g by mouth daily. 09/07/16   Angiulli, Lavon Paganini, PA-C  simvastatin (ZOCOR) 20 MG tablet TAKE 1 TABLET BY MOUTH EVERY NIGHT AT BEDTIME 05/06/14   Jerline Pain, MD  traMADol (ULTRAM) 50 MG tablet Take 1 tablet (50 mg total) by mouth every 6 (six) hours. 09/06/16   Angiulli, Lavon Paganini, PA-C  valsartan (DIOVAN) 80 MG tablet Take 1 tablet (80 mg total) by mouth every morning. 09/06/16   Cathlyn Parsons, PA-C    Physical Exam: Vitals:   02/04/18 1315 02/04/18 1330 02/04/18 1345 02/04/18 1400  BP: (!) 108/52 (!) 107/52 (!) 93/52 (!) 107/50  Pulse: (!) 49 (!) 49 (!) 51 (!) 49  Resp: 14 13 13 15   SpO2: 97% 97% 98% 97%  Weight:      Height:        Constitutional: NAD, calm, comfortable Eyes: Pinpoint pupils after receiving morphine in the ED, reactive, lids and conjunctivae normal ENMT: Mucous membranes are moist. Wearing [place] oxygen Neck: normal, supple, healed neck scar, JVD seen 2cm above sternal notch, does not appear grossly elevated.  Respiratory: mild crackles at bases, otherwise clear, no wheezing Cardiovascular: Bradycardic, holosystolic murmur heard at all precordial sites, pulses are equal in radial arteries.  No palpable chest pain Abdomen: Mild abdominal swelling, no pain over liver, +BS Musculoskeletal: He has some mildly swollen small joints of the hands, non tender, no serious joint deformities Skin: no  rashes, lesions, ulcers on exposed skin Neurologic: Grossly intact, moving all extremities.  Psychiatric: Normal judgment and insight. Alert and oriented x 3. Normal mood.    Labs on Admission: I have personally reviewed following labs and imaging studies  CBC: Recent Labs  Lab 02/04/18 1157  WBC 9.6  NEUTROABS 8.2*  HGB 12.3*  HCT 38.4*  MCV 92.3  PLT 683*   Basic Metabolic Panel: Recent Labs  Lab 02/04/18 1157  NA 137  K 4.1  CL 102  CO2 24  GLUCOSE 170*  BUN 16  CREATININE 1.25*  CALCIUM 8.5*  MG 1.8   GFR: Estimated Creatinine Clearance: 38.9 mL/min (A) (by C-G formula based on SCr of 1.25 mg/dL (H)). Liver Function Tests: Recent Labs  Lab 02/04/18 1157  AST 472*  ALT 424*  ALKPHOS 100  BILITOT 3.1*  PROT 6.3*  ALBUMIN 3.5   No results for input(s): LIPASE, AMYLASE in the last 168 hours. No results for input(s): AMMONIA in the last 168 hours. Coagulation Profile: Recent Labs  Lab 02/04/18 1157  INR 1.14   Cardiac Enzymes: Recent Labs  Lab 02/04/18 1157  TROPONINI <0.03   BNP (last 3 results) No results for input(s): PROBNP in the last 8760 hours. HbA1C: No results for input(s): HGBA1C in the last 72 hours. CBG: Recent Labs  Lab 02/04/18 1206  GLUCAP 167*   Lipid Profile: No results for input(s): CHOL, HDL, LDLCALC, TRIG, CHOLHDL, LDLDIRECT in the last 72 hours. Thyroid Function Tests: No results for input(s): TSH, T4TOTAL, FREET4, T3FREE, THYROIDAB in the last 72 hours. Anemia Panel: No results for input(s): VITAMINB12, FOLATE, FERRITIN, TIBC, IRON, RETICCTPCT in the last 72 hours. Urine analysis:    Component Value Date/Time   COLORURINE YELLOW  08/13/2016 [place] 08/13/2016 1820   LABSPEC 1.038 (H) 08/13/2016 1820   PHURINE 5.0 08/13/2016 1820   GLUCOSEU NEGATIVE 08/13/2016 1820   HGBUR LARGE (A) 08/13/2016 1820   BILIRUBINUR NEGATIVE 08/13/2016 1820   KETONESUR 20 (A) 08/13/2016 1820   PROTEINUR NEGATIVE  08/13/2016 1820   UROBILINOGEN 1.0 04/14/2014 0116   NITRITE NEGATIVE 08/13/2016 1820   LEUKOCYTESUR NEGATIVE 08/13/2016 1820    Radiological Exams on Admission: Dg Chest Portable 1 View  Result Date: 02/04/2018 CLINICAL DATA:  Chest pain EXAM: PORTABLE CHEST 1 VIEW COMPARISON:  03/15/2017 chest radiograph. FINDINGS: Stable cardiomediastinal silhouette with normal heart size. No pneumothorax. No pleural effusion. Left upper lobe 6 mm pulmonary nodule is stable since 08/24/2016 chest CT and considered benign. No pulmonary edema. No acute consolidative airspace disease. IMPRESSION: No active cardiopulmonary disease. Electronically Signed   By: Ilona Sorrel M.D.   On: 02/04/2018 12:17    EKG: Independently reviewed. NSR, bradycardic, J point elevated V2  Assessment/Plan  Chest tightness, SOB, lightheadedness with h/o Aortic stenosis - Last TTE with peak velocity of 4.62m/s, now with chest pressure and SOB X 1 day, this improved with aspirin and morphine.  CXR did not show any fluid accumulation.  BNP is elevated.  Reviewed Dr. Marlou Porch last note from 2016 - appeared to be considering surgical repair, but would need re-evaluation.  I have asked Cardiology to see him.   - Trend troponin, repeat EKG - Careful use of diuretics if needed, currently not SOB, pOx was > 90% on oxygen - Transaminitis may be related to congestive hepatopathy, see below - Patients daughter reports that he is not taking hctz.  I have held valsartan given his BP  - Repeat TTE ordered  Abdominal bloating, nausea, transaminitis - He reports eating some shrimp last night, which may not have agreed with him - Ratio would support possible viral hepatitis, check for HAV, HBV and HCV (history of HAV in the past), he is not a heavy drinker (daily 1 glass wine, sometimes more) - Family requesting abdominal imaging as he has a history of ileus, AXR ordered - Abdominal ultrasound ordered to start work up - Tylenol level pending -  Zofran for nausea  Essential hypertension - Held BP medications today, restart valsartan if BP starts to rise    Sinus bradycardia - Chronic, monitor on telemetry - Check TSH - history of thyroid lobectomy    Dysphagia - History per daughter in room, reviewed last note from 2018 and noted regular solids, but small bites.  Encourage patient to eat slowly.  Heart healthy diet ordered.    DVT prophylaxis: Lovenox Code Status: Full Family Communication: Daughter [name] at bedside, Niece (formerly worked for hospitalist group) Disposition Plan: Admit, evaluation by Cardiology Consults called: Dr. Johnsie Cancel, Cardiology Admission status: Admit, telemetry, inpatient   Gilles Chiquito MD [facility] Pager [phone]  If 7PM-7AM, please contact night-coverage [email or web address] Password [facility]  02/04/2018, 2:55 PM

## 2018-02-04 NOTE — ED Provider Notes (Signed)
[facility] EMERGENCY DEPARTMENT Provider Note   CSN: [id number] Arrival date & time: 02/04/18  1143     History   Chief Complaint Chief Complaint  Patient presents with  . Chest Pain    HPI [name] is a 84 y.o. male.  HPI Presents with his daughter who assists with the HPI. Patient has multiple medical issues, but is generally well, active. I took care of this patient 1 year ago, when he fell from a tree, while trying to stop squirrels from stealing his apples.  He now notes that over the past month he has had episodes of chest tightness on the left, with occasional dyspnea, but over the past 15 hours he has had discomfort described as pressure-like sensation, with waxing, waning severity, including tightness on the left chest, with some dyspnea. He was eating rewarmed shrimp prior to the onset of symptoms, voices some concern of possible food reaction however, the patient has no sustained nausea, has not had obvious diarrhea, nor vomiting aside from making himself do so once. No fever. Patient took 3 aspirin prior to ED arrival, pain has diminished substantially.  Past Medical History:  Diagnosis Date  . At risk for sleep apnea    STOP-BANG= 4    SENT TO PCP 04-04-2014  . BPH (benign prostatic hypertrophy)   . Depression   . Diverticulosis of colon   . GERD (gastroesophageal reflux disease)   . Gilbert's syndrome   . Headache    migraines  . Hearing loss   . Heart murmur    "Calcified aortic valve"  . Hepatitis    Hep A as a child  . History of gastroenteritis   . History of kidney stones   . History of syncope    2002--  vasovagal  . HOH (hard of hearing)    refuses to wear his Hearing Aid  . Hypertension   . Inguinal hernia    right  . Internal hemorrhoid, bleeding   . Moderate aortic stenosis    a. Last echo 11/2013, 06/2014.  Marland Kitchen Nocturia   . Normal coronary arteries    a. Cath 07/2010: normal coronaries. Felt to have noncardiac  CP/SOB at that time possibly r/t anxiety.  . RA (rheumatoid arthritis) ([place])    bilateral hands/ wrist--  seronegative  . Rotator cuff syndrome of right shoulder 08/01/2013  . Shortness of breath dyspnea    with exertion  . Stone, kidney   . Thyroid cyst   . Thyroid nodule    noted 11-2009  . Wears dentures     Patient Active Problem List   Diagnosis Date Noted  . Closed T10 spinal fracture ([place]) 08/29/2016  . Abdominal distention   . Benign essential HTN   . Pneumoperitoneum   . Pain   . Acute blood loss anemia   . Tachypnea   . Hemoperitoneum   . Adynamic ileus ([place])   . Nausea in adult   . Acute diastolic heart failure ([place])   . Ileus ([place]) 08/20/2016  . Dysphagia 08/20/2016  . Chest pain 08/20/2016  . Pneumothorax 08/13/2016  . 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 Aortic stenosis 06/20/2014  . Chest tightness 06/20/2014  . Essential hypertension 06/20/2014  . Insomnia 06/20/2014  . Sinus bradycardia 06/20/2014  . Rectal bleeding 01/20/2014  . Aortic valve disorders 11/07/2013  . Rotator cuff syndrome of right shoulder 08/01/2013    Past Surgical History:  Procedure Laterality Date  .  CARDIAC CATHETERIZATION  07-19-2010  dr Tressia Miners turner   normal coronary arteries,  ef 60%,  moderate aortic stenosis- gradient 30mmHg, normal right heart pressure  . CARDIOVASCULAR STRESS TEST  05/ 2011   dr Marlou Porch   normal low risk perfusion study  . CATARACT EXTRACTION W/ INTRAOCULAR LENS  IMPLANT, BILATERAL  2013  . COLONOSCOPY  2010  approx  . THYROID LOBECTOMY Right 05/05/2015   Procedure: RIGHT THYROID LOBECTOMY;  Surgeon: Armandina Gemma, MD;  Location: [facility];  Service: General;  Laterality: Right;  . TRANSANAL HEMORRHOIDAL DEARTERIALIZATION N/A 04/09/2014   Procedure: TRANSANAL HEMORRHOIDAL DEARTERIALIZATION OF INTERNAL HEMORROIDS;  Surgeon: Leighton Ruff, MD;  Location: [facility];  Service: General;  Laterality: N/A;  .  TRANSTHORACIC ECHOCARDIOGRAM  11-26-2013   moderate focal basal and mild LVH/  ef 16-10%/  grade I diastolic dysfunction/ mild LAE/  moderate calcification with stenosis AV with mild regurg,  gradients 35 abd 58 mmHg /  mild TR        Home Medications    Prior to Admission medications   Medication Sig Start Date End Date Taking? Authorizing Provider  cyclobenzaprine (FLEXERIL) 5 MG tablet Take 1 tablet (5 mg total) by mouth 3 (three) times daily. 09/06/16   Angiulli, Lavon Paganini, PA-C  docusate sodium (COLACE) 100 MG capsule Take 1 capsule (100 mg total) by mouth 2 (two) times daily. 09/06/16   Angiulli, Lavon Paganini, PA-C  escitalopram (LEXAPRO) 20 MG tablet Take 1 tablet (20 mg total) by mouth daily. 09/06/16   Angiulli, Lavon Paganini, PA-C  folic acid (FOLVITE) 1 MG tablet Take 1 mg by mouth daily.  01/27/15   [provider]  hydrochlorothiazide (MICROZIDE) 12.5 MG capsule Take 1 capsule (12.5 mg total) by mouth daily. 09/06/16   Angiulli, Lavon Paganini, PA-C  linaclotide (LINZESS) 145 MCG CAPS capsule Take 1 capsule (145 mcg total) by mouth daily. 09/07/16   Angiulli, Lavon Paganini, PA-C  methotrexate (RHEUMATREX) 2.5 MG tablet Take 22.5 mg by mouth once a week. TAKE ON SUNDAYS 07/13/13   [provider]  omeprazole (PRILOSEC) 20 MG capsule Take 1 capsule (20 mg total) by mouth daily. 09/06/16   Angiulli, Lavon Paganini, PA-C  polyethylene glycol (MIRALAX / GLYCOLAX) packet Take 17 g by mouth daily. 09/07/16   Angiulli, Lavon Paganini, PA-C  simvastatin (ZOCOR) 20 MG tablet TAKE 1 TABLET BY MOUTH EVERY NIGHT AT BEDTIME 05/06/14   Jerline Pain, MD  traMADol (ULTRAM) 50 MG tablet Take 1 tablet (50 mg total) by mouth every 6 (six) hours. 09/06/16   Angiulli, Lavon Paganini, PA-C  valsartan (DIOVAN) 80 MG tablet Take 1 tablet (80 mg total) by mouth every morning. 09/06/16   Angiulli, Lavon Paganini, PA-C    Family History Family History  Problem Relation Age of Onset  . Pulmonary embolism Mother 37       caused by  complications of surgery  . CVA Father   . Diabetes Brother   . Diabetes Brother   . Breast cancer Sister     Social History Social History   Tobacco Use  . Smoking status: Former Smoker    Years: 40.00    Types: Cigarettes    Start date: 06/13/1973    Last attempt to quit: 04/03/1981    Years since quitting: 36.8  . Smokeless tobacco: Never Used  Substance Use Topics  . Alcohol use: Yes    Comment: occasional  . Drug use: No     Allergies  Ativan [lorazepam] and Phenergan [promethazine hcl]   Review of Systems Review of Systems  Constitutional:       Per HPI, otherwise negative  HENT:       Per HPI, otherwise negative  Respiratory:       Per HPI, otherwise negative  Cardiovascular:       Per HPI, otherwise negative  Gastrointestinal: Positive for vomiting.  Endocrine:       Negative aside from HPI  Genitourinary:       Neg aside from HPI   Musculoskeletal:       Per HPI, otherwise negative  Skin: Negative.   Neurological: Negative for syncope.     Physical Exam Updated Vital Signs Ht 5\' 7"  (1.702 m)   Wt 76 kg   SpO2 98%   BMI 26.24 kg/m   Physical Exam  Constitutional: He is oriented to person, place, and time. He appears well-developed. No distress.  HENT:  Head: Normocephalic and atraumatic.  Eyes: Conjunctivae and EOM are normal.  Cardiovascular: Normal rate and regular rhythm.  Pulmonary/Chest: Effort normal. No stridor. No respiratory distress.  Abdominal: He exhibits no distension.  Musculoskeletal: He exhibits no edema.  Neurological: He is alert and oriented to person, place, and time.  Skin: Skin is warm and dry.  Psychiatric: He has a normal mood and affect.  Nursing note and vitals reviewed.    ED Treatments / Results  Labs (all labs ordered are listed, but only abnormal results are displayed) Labs Reviewed  COMPREHENSIVE METABOLIC PANEL - Abnormal; Notable for the following components:      Result Value   Glucose, Bld 170  (*)    Creatinine, Ser 1.25 (*)    Calcium 8.5 (*)    Total Protein 6.3 (*)    AST 472 (*)    ALT 424 (*)    Total Bilirubin 3.1 (*)    GFR calc non Af Amer 50 (*)    GFR calc Af Amer 58 (*)    All other components within normal limits  BRAIN NATRIURETIC PEPTIDE - Abnormal; Notable for the following components:   B Natriuretic Peptide 380.0 (*)    All other components within normal limits  CBC WITH DIFFERENTIAL/PLATELET - Abnormal; Notable for the following components:   RBC 4.16 (*)    Hemoglobin 12.3 (*)    HCT 38.4 (*)    Platelets 148 (*)    Neutro Abs 8.2 (*)    Lymphs Abs 0.6 (*)    All other components within normal limits  CBG MONITORING, ED - Abnormal; Notable for the following components:   Glucose-Capillary 167 (*)    All other components within normal limits  MAGNESIUM  TROPONIN I  PROTIME-INR    EKG EKG Interpretation  Date/Time:  Sunday February 04 2018 11:53:48 EDT Ventricular Rate:  55 PR Interval:    QRS Duration: 95 QT Interval:  419 QTC Calculation: 401 R Axis:   2 Text Interpretation:  Sinus rhythm Atrial premature complex Borderline ST elevation, anterior leads Artifact Abnormal ekg Confirmed by Carmin Muskrat [phone]) on 02/04/2018 12:20:17 PM   Radiology Dg Chest Portable 1 View  Result Date: 02/04/2018 CLINICAL DATA:  Chest pain EXAM: PORTABLE CHEST 1 VIEW COMPARISON:  03/15/2017 chest radiograph. FINDINGS: Stable cardiomediastinal silhouette with normal heart size. No pneumothorax. No pleural effusion. Left upper lobe 6 mm pulmonary nodule is stable since 08/24/2016 chest CT and considered benign. No pulmonary edema. No acute consolidative airspace disease. IMPRESSION: No active cardiopulmonary disease. Electronically  Signed   By: Ilona Sorrel M.D.   On: 02/04/2018 12:17    Procedures Procedures (including critical care time)  Medications Ordered in ED Medications  aspirin chewable tablet 324 mg (324 mg Oral Not Given 02/04/18 1159)      Initial Impression / Assessment and Plan / ED Course  I have reviewed the triage vital signs and the nursing notes.  Pertinent labs & imaging results that were available during my care of the patient were reviewed by me and considered in my medical decision making (see chart for details).     1:20 PM Patient in similar condition. I reviewed the patient's labs, imaging, x-ray. I also reviewed his chart, including echocardiogram from March of this year, results as below.  Study Conclusions   - Procedure narrative: Transthoracic echocardiography. The study   was technically difficult, as a result of poor acoustic windows   and poor sound wave transmission. Intravenous contrast (Definity)   was administered. - Left ventricle: The cavity size was normal. Systolic function was   vigorous. The estimated ejection fraction was in the range of 65%   to 70%. Wall motion was normal; there were no regional wall   motion abnormalities. Doppler parameters are consistent with   abnormal left ventricular relaxation (grade 1 diastolic   dysfunction). Doppler parameters are consistent with high   ventricular filling pressure. - Aortic valve: Transvalvular velocities are elevated. Poorly   visualized. Peak velocity (S): 422 cm/s. Mean gradient (S): 37 mm   Hg.  Exam patient is in similar condition. I discussed all findings with his daughter and niece, the latter of whom is a Designer, jewellery. We discussed evidence for heart failure, and with prior evidence for aortic stenosis, these may be related. No evidence for ongoing coronary ischemia, with a reassuring troponin, nonischemic EKG. Given the patient's prescription of chest pain, dyspnea, new elevated BNP, concern for heart failure, the patient received Lasix, will be admitted for further evaluation, management, possible echocardiogram. Final Clinical Impressions(s) / ED Diagnoses   Final diagnoses:  Atypical chest pain    ED  Discharge Orders    None       Carmin Muskrat, MD 02/04/18 1623

## 2018-02-04 NOTE — Consult Note (Signed)
Cardiology Consultation:   Patient ID: [name]; [id number]; June 24, 1927   Admit date: 02/04/2018 Date of Consult: 02/04/2018  Primary Care Provider: Lajean Manes, MD Primary Cardiologist: [facility] Primary Electrophysiologist:  None   Patient Profile:   84 y.o. admitted with dyspnea, chest pain and abdominal pain  .  History of Present Illness:   [name] is a 84 y.o. male with a hx of aortic stenosis  who is being seen today for the evaluation of chest pain and dyspnea at the request of Dr Daryll Drown. Patient is originally from [place] and came to [place] in 46 as refugee. 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 Speaks little english but daughter and niece present and speak good [place]. Niece use to be physician in old country and worked as a PA for [employer] with Dr Rollene Fare. He is still very independent, drives , cooks, lives independently Admitted with dyspnea, likely CHF chest pain and abdominal pain. No history of CAD normal myovue 2012. .Last echo done 08/21/16 showed normal EF with peak velocity of 4.22 mean gradient 37 and peak gradient of 71 mmHg. Images noted to be difficult. Rather sudden onset of symptoms last 24 hours Feels like he is suffocating. Chest pressure central no radiation Nausea abdominal bloating no vomiting History of right inguinal hernia but no other abdominal pathology on CT 03/23/17.  BNP is elevated at 380 WBC normal LFTls elevated AST 472 ALT 424 bilirubin up 3.1   Past Medical History:  Diagnosis Date  . At risk for sleep apnea    STOP-BANG= 4    SENT TO PCP 04-04-2014  . BPH (benign prostatic hypertrophy)   . Depression   . Diverticulosis of colon   . GERD (gastroesophageal reflux disease)   . Gilbert's syndrome   . Headache    migraines  . Hearing loss   . Heart murmur    "Calcified aortic valve"  . Hepatitis    Hep A as a child  . History of gastroenteritis   . History of kidney stones   . History of syncope    2002--  vasovagal  . HOH (hard of hearing)     refuses to wear his Hearing Aid  . Hypertension   . Inguinal hernia    right  . Internal hemorrhoid, bleeding   . Moderate aortic stenosis    a. Last echo 11/2013, 06/2014.  Marland Kitchen Nocturia   . Normal coronary arteries    a. Cath 07/2010: normal coronaries. Felt to have noncardiac CP/SOB at that time possibly r/t anxiety.  . RA (rheumatoid arthritis) ([place])    bilateral hands/ wrist--  seronegative  . Rotator cuff syndrome of right shoulder 08/01/2013  . Shortness of breath dyspnea    with exertion  . Stone, kidney   . Thyroid cyst   . Thyroid nodule    noted 11-2009  . Wears dentures     Past Surgical History:  Procedure Laterality Date  . CARDIAC CATHETERIZATION  07-19-2010  dr Tressia Miners turner   normal coronary arteries,  ef 60%,  moderate aortic stenosis- gradient 38mmHg, normal right heart pressure  . CARDIOVASCULAR STRESS TEST  05/ 2011   dr Marlou Porch   normal low risk perfusion study  . CATARACT EXTRACTION W/ INTRAOCULAR LENS  IMPLANT, BILATERAL  2013  . COLONOSCOPY  2010  approx  . THYROID LOBECTOMY Right 05/05/2015   Procedure: RIGHT THYROID LOBECTOMY;  Surgeon: Armandina Gemma, MD;  Location: [place];  Service: General;  Laterality: Right;  . TRANSANAL HEMORRHOIDAL DEARTERIALIZATION N/A  04/09/2014   Procedure: TRANSANAL HEMORRHOIDAL DEARTERIALIZATION OF INTERNAL HEMORROIDS;  Surgeon: Leighton Ruff, MD;  Location: [facility];  Service: General;  Laterality: N/A;  . TRANSTHORACIC ECHOCARDIOGRAM  11-26-2013   moderate focal basal and mild LVH/  ef 82-42%/  grade I diastolic dysfunction/ mild LAE/  moderate calcification with stenosis AV with mild regurg,  gradients 35 abd 58 mmHg /  mild TR     Home Medications:  Prior to Admission medications   Medication Sig Start Date End Date Taking? Authorizing Provider  Ascorbic Acid (VITAMIN C) 1000 MG tablet Take 1,000 mg by mouth daily after lunch.   Yes [provider]  dutasteride (AVODART) 0.5 MG capsule Take 0.5 mg  by mouth daily.   Yes [provider]  escitalopram (LEXAPRO) 20 MG tablet Take 1 tablet (20 mg total) by mouth daily. Patient taking differently: Take 20 mg by mouth at bedtime.  09/06/16  Yes Angiulli, Lavon Paganini, PA-C  folic acid (FOLVITE) 1 MG tablet Take 1 mg by mouth daily after lunch.  01/27/15  Yes [provider]  ibuprofen (ADVIL,MOTRIN) 800 MG tablet Take 800 mg by mouth daily. 12/18/17  Yes [provider]  losartan (COZAAR) 25 MG tablet Take 25 mg by mouth daily.  01/21/18  Yes [provider]  Melatonin 10 MG TABS Take 10 mg by mouth at bedtime as needed (sleep).   Yes [provider]  methotrexate (RHEUMATREX) 2.5 MG tablet Take 22.5 mg by mouth every Sunday.  07/13/13  Yes [provider]  Omega-3 Fatty Acids (FISH OIL) 1000 MG CAPS Take 1,000 mg by mouth daily.   Yes [provider]  simvastatin (ZOCOR) 20 MG tablet TAKE 1 TABLET BY MOUTH EVERY NIGHT AT BEDTIME Patient taking differently: Take 20 mg by mouth at bedtime.  05/06/14  Yes Jerline Pain, MD  vitamin E 400 UNIT capsule Take 400 Units by mouth daily after lunch.   Yes [provider]  cyclobenzaprine (FLEXERIL) 5 MG tablet Take 1 tablet (5 mg total) by mouth 3 (three) times daily. Patient not taking: Reported on 02/04/2018 09/06/16   Angiulli, Lavon Paganini, PA-C  docusate sodium (COLACE) 100 MG capsule Take 1 capsule (100 mg total) by mouth 2 (two) times daily. Patient not taking: Reported on 02/04/2018 09/06/16   Angiulli, Lavon Paganini, PA-C  hydrochlorothiazide (MICROZIDE) 12.5 MG capsule Take 1 capsule (12.5 mg total) by mouth daily. Patient not taking: Reported on 02/04/2018 09/06/16   Angiulli, Lavon Paganini, PA-C  linaclotide [facility]) 145 MCG CAPS capsule Take 1 capsule (145 mcg total) by mouth daily. Patient not taking: Reported on 02/04/2018 09/07/16   Angiulli, Lavon Paganini, PA-C  omeprazole (PRILOSEC) 20 MG capsule Take 1 capsule (20 mg total) by mouth daily. Patient  not taking: Reported on 02/04/2018 09/06/16   Angiulli, Lavon Paganini, PA-C  polyethylene glycol [facility] / GLYCOLAX) packet Take 17 g by mouth daily. Patient not taking: Reported on 02/04/2018 09/07/16   Angiulli, Lavon Paganini, PA-C  traMADol (ULTRAM) 50 MG tablet Take 1 tablet (50 mg total) by mouth every 6 (six) hours. Patient not taking: Reported on 02/04/2018 09/06/16   Angiulli, Lavon Paganini, PA-C  valsartan (DIOVAN) 80 MG tablet Take 1 tablet (80 mg total) by mouth every morning. Patient not taking: Reported on 02/04/2018 09/06/16   Cathlyn Parsons, PA-C    Inpatient Medications: Scheduled Meds: . aspirin  324 mg Oral Once  . aspirin EC  81 mg Oral Daily  .  enoxaparin (LOVENOX) injection  40 mg Subcutaneous Q24H  . escitalopram  20 mg Oral QHS  . folic acid  1 mg Oral Daily   Continuous Infusions:  PRN Meds: ondansetron **OR** ondansetron (ZOFRAN) IV, oxyCODONE  Allergies:    Allergies  Allergen Reactions  . Ativan [Lorazepam] Other (See Comments)    hallucinations  . Phenergan [Promethazine Hcl] Hypertension  . Citrus Rash    Small rash after eating for several days in a row  . Other Rash    Reaction to Honey    Social History:   Social History   Socioeconomic History  . Marital status: Widowed    Spouse name: Not on file  . Number of children: Not on file  . Years of education: Not on file  . Highest education level: Not on file  Occupational History  . Not on file  Social Needs  . Financial resource strain: Not on file  . Food insecurity:    Worry: Not on file    Inability: Not on file  . Transportation needs:    Medical: Not on file    Non-medical: Not on file  Tobacco Use  . Smoking status: Former Smoker    Years: 40.00    Types: Cigarettes    Start date: 06/13/1973    Last attempt to quit: 04/03/1981    Years since quitting: 36.8  . Smokeless tobacco: Never Used  Substance and Sexual Activity  . Alcohol use: Yes    Comment: occasional  . Drug use: No  .  Sexual activity: Not on file  Lifestyle  . Physical activity:    Days per week: Not on file    Minutes per session: Not on file  . Stress: Not on file  Relationships  . Social connections:    Talks on phone: Not on file    Gets together: Not on file    Attends religious service: Not on file    Active member of club or organization: Not on file    Attends meetings of clubs or organizations: Not on file    Relationship status: Not on file  . Intimate partner violence:    Fear of current or ex partner: Not on file    Emotionally abused: Not on file    Physically abused: Not on file    Forced sexual activity: Not on file  Other Topics Concern  . Not on file  Social History Narrative  . Not on file    Family History:    Family History  Problem Relation Age of Onset  . Pulmonary embolism Mother 26       caused by complications of surgery  . CVA Father   . Diabetes Brother   . Diabetes Brother   . Breast cancer Sister      ROS:  Please see the history of present illness.  No history of GB disease abdominal surgery CAD  All other ROS reviewed and negative.     Physical Exam/Data:   Vitals:   02/04/18 1430 02/04/18 1445 02/04/18 1500 02/04/18 1529  BP: (!) 114/55 (!) 105/55 (!) 115/53 (!) 121/53  Pulse: (!) 55 (!) 51 (!) 51 (!) 51  Resp: 16 14 15 18   Temp:    97.7 F (36.5 C)  TempSrc:    Oral  SpO2: 98% 99% 99% 100%  Weight:      Height:        Intake/Output Summary (Last 24 hours) at 02/04/2018 1609 Last data filed at  02/04/2018 1153 Gross per 24 hour  Intake 150 ml  Output -  Net 150 ml   Filed Weights   02/04/18 1206  Weight: 76 kg   Body mass index is 26.24 kg/m.  General:  ? Mild jaundice   HEENT: normal Lymph: no adenopathy Neck: no JVD Endocrine:  No thryomegaly Vascular: No carotid bruits; FA pulses 2+ bilaterally without bruits  Cardiac:  Severe AS murmur with muffled S2 Lungs:  clear to auscultation bilaterally, no wheezing, rhonchi or  rales  Abd: currently mildly distended not acute no rebound   Ext: no edema Musculoskeletal:  No deformities, BUE and BLE strength normal and equal Skin: warm and dry  Neuro:  CNs 2-12 intact, no focal abnormalities noted Psych:  Normal affect   EKG:  The EKG was personally reviewed and demonstrates:  SR rate 55 no acute changes  Telemetry:  Telemetry was personally reviewed and demonstrates:  NSR 02/04/2018   Relevant CV Studies: See HPI for TTE done 2018  Laboratory Data:  Chemistry Recent Labs  Lab 02/04/18 1157  NA 137  K 4.1  CL 102  CO2 24  GLUCOSE 170*  BUN 16  CREATININE 1.25*  CALCIUM 8.5*  GFRNONAA 50*  GFRAA 58*  ANIONGAP 11    Recent Labs  Lab 02/04/18 1157  PROT 6.3*  ALBUMIN 3.5  AST 472*  ALT 424*  ALKPHOS 100  BILITOT 3.1*   Hematology Recent Labs  Lab 02/04/18 1157  WBC 9.6  RBC 4.16*  HGB 12.3*  HCT 38.4*  MCV 92.3  MCH 29.6  MCHC 32.0  RDW 11.6  PLT 148*   Cardiac Enzymes Recent Labs  Lab 02/04/18 1157  TROPONINI <0.03   No results for input(s): TROPIPOC in the last 168 hours.  BNP Recent Labs  Lab 02/04/18 1158  BNP 380.0*    DDimer No results for input(s): DDIMER in the last 168 hours.  Radiology/Studies:  Dg Chest Portable 1 View  Result Date: 02/04/2018 CLINICAL DATA:  Chest pain EXAM: PORTABLE CHEST 1 VIEW COMPARISON:  03/15/2017 chest radiograph. FINDINGS: Stable cardiomediastinal silhouette with normal heart size. No pneumothorax. No pleural effusion. Left upper lobe 6 mm pulmonary nodule is stable since 08/24/2016 chest CT and considered benign. No pulmonary edema. No acute consolidative airspace disease. IMPRESSION: No active cardiopulmonary disease. Electronically Signed   By: Ilona Sorrel M.D.   On: 02/04/2018 12:17    Assessment and Plan:   1. Aortic stenosis:  Dyspnea and chest pain likely from severe AS. Given age not a surgical candidate but I think he is a good TAVR candidate. TTE ordered but AS was severe  to my review on echo 08/21/16 Agree with single dose iv lasix given CXR with NAD and no CHF but BNP 380. Do not think chest pain is from CAD or Angina more likely from valve disease. ECG normal troponin negative and no history of CAD with normal myovue 2012.  2. Abdominal Pain: Jaundice with elevated LFTls and bilirubin 3.1 Primary service has ordered US and KUB but suspect he may need repeat abdominal CT. Currently abdomen is not acute afebrile and WBC not elevated.   The family is very much in favor of TAVR if possible but GI/Abdominal issues will have to be sorted before proceeding with anything other than TTE.     For questions or updates, please contact [place] Please consult [email or web address] for contact info under Cardiology/STEMI.   Signed, Jenkins Rouge, MD  02/04/2018 4:09 PM

## 2018-02-04 NOTE — ED Triage Notes (Signed)
Patient felt chest pain and pressure all day yesterday with worsening pain starting last night. Per EMS patient given 4mg , 4mg  morphine, 324 ASA 0.4 nitro.

## 2018-02-05 ENCOUNTER — Inpatient Hospital Stay (HOSPITAL_COMMUNITY): Payer: Medicare Other

## 2018-02-05 ENCOUNTER — Ambulatory Visit (HOSPITAL_COMMUNITY): Payer: Medicare Other

## 2018-02-05 ENCOUNTER — Encounter (HOSPITAL_COMMUNITY): Payer: Self-pay | Admitting: Physician Assistant

## 2018-02-05 DIAGNOSIS — R740 Nonspecific elevation of levels of transaminase and lactic acid dehydrogenase [LDH]: Secondary | ICD-10-CM

## 2018-02-05 DIAGNOSIS — I361 Nonrheumatic tricuspid (valve) insufficiency: Secondary | ICD-10-CM

## 2018-02-05 DIAGNOSIS — R0602 Shortness of breath: Secondary | ICD-10-CM

## 2018-02-05 DIAGNOSIS — R0789 Other chest pain: Secondary | ICD-10-CM

## 2018-02-05 DIAGNOSIS — I10 Essential (primary) hypertension: Secondary | ICD-10-CM

## 2018-02-05 LAB — CBC
HEMATOCRIT: 44.7 % (ref 39.0–52.0)
HEMOGLOBIN: 14.2 g/dL (ref 13.0–17.0)
MCH: 29.2 pg (ref 26.0–34.0)
MCHC: 31.8 g/dL (ref 30.0–36.0)
MCV: 92 fL (ref 78.0–100.0)
Platelets: 104 10*3/uL — ABNORMAL LOW (ref 150–400)
RBC: 4.86 MIL/uL (ref 4.22–5.81)
RDW: 12 % (ref 11.5–15.5)
WBC: 8 10*3/uL (ref 4.0–10.5)

## 2018-02-05 LAB — COMPREHENSIVE METABOLIC PANEL
ALBUMIN: 3.4 g/dL — AB (ref 3.5–5.0)
ALT: 319 U/L — ABNORMAL HIGH (ref 0–44)
ANION GAP: 7 (ref 5–15)
AST: 238 U/L — ABNORMAL HIGH (ref 15–41)
Alkaline Phosphatase: 138 U/L — ABNORMAL HIGH (ref 38–126)
BUN: 20 mg/dL (ref 8–23)
CO2: 24 mmol/L (ref 22–32)
Calcium: 8 mg/dL — ABNORMAL LOW (ref 8.9–10.3)
Chloride: 104 mmol/L (ref 98–111)
Creatinine, Ser: 1.36 mg/dL — ABNORMAL HIGH (ref 0.61–1.24)
GFR calc Af Amer: 52 mL/min — ABNORMAL LOW (ref >60)
GFR, EST NON AFRICAN AMERICAN: 45 mL/min — AB (ref >60)
Glucose, Bld: 111 mg/dL — ABNORMAL HIGH (ref 70–99)
POTASSIUM: 3.9 mmol/L (ref 3.5–5.1)
Sodium: 135 mmol/L (ref 135–145)
Total Bilirubin: 5.6 mg/dL — ABNORMAL HIGH (ref 0.3–1.2)
Total Protein: 6.7 g/dL (ref 6.5–8.1)

## 2018-02-05 LAB — ECHOCARDIOGRAM COMPLETE
HEIGHTINCHES: 67 in
Weight: 2687.85 oz

## 2018-02-05 LAB — GLUCOSE, CAPILLARY
GLUCOSE-CAPILLARY: 121 mg/dL — AB (ref 70–99)
GLUCOSE-CAPILLARY: 129 mg/dL — AB (ref 70–99)
Glucose-Capillary: 111 mg/dL — ABNORMAL HIGH (ref 70–99)
Glucose-Capillary: 126 mg/dL — ABNORMAL HIGH (ref 70–99)
Glucose-Capillary: 115 mg/dL — ABNORMAL HIGH (ref 70–99)

## 2018-02-05 LAB — PROTIME-INR
INR: 1.17
PROTHROMBIN TIME: 14.8 s (ref 11.4–15.2)

## 2018-02-05 LAB — BILIRUBIN, FRACTIONATED(TOT/DIR/INDIR)
BILIRUBIN TOTAL: 5.4 mg/dL — AB (ref 0.3–1.2)
Bilirubin, Direct: 2.7 mg/dL — ABNORMAL HIGH (ref 0.0–0.2)
Indirect Bilirubin: 2.7 mg/dL — ABNORMAL HIGH (ref 0.3–0.9)

## 2018-02-05 LAB — ACETAMINOPHEN LEVEL

## 2018-02-05 LAB — TROPONIN I

## 2018-02-05 MED ORDER — SODIUM CHLORIDE 0.9 % WEIGHT BASED INFUSION: 1.0000 mL/kg/h | INTRAVENOUS | Status: DC

## 2018-02-05 MED ORDER — SODIUM CHLORIDE 0.9 % WEIGHT BASED INFUSION
3.0000 mL/kg/h | INTRAVENOUS | Status: DC
  Administered 2018-02-06: 3 mL/kg/h via INTRAVENOUS

## 2018-02-05 MED ORDER — FUROSEMIDE 20 MG PO TABS
20.0000 mg | ORAL_TABLET | Freq: Every day | ORAL | Status: DC
  Administered 2018-02-05: 20 mg via ORAL
  Filled 2018-02-05: qty 1

## 2018-02-05 MED ORDER — ASPIRIN EC 81 MG PO TBEC
81.0000 mg | DELAYED_RELEASE_TABLET | Freq: Every day | ORAL | Status: DC
  Administered 2018-02-07: 81 mg via ORAL
  Filled 2018-02-05: qty 1

## 2018-02-05 MED ORDER — ASPIRIN 81 MG PO CHEW
81.0000 mg | CHEWABLE_TABLET | ORAL | Status: AC
  Administered 2018-02-06: 81 mg via ORAL
  Filled 2018-02-05: qty 1

## 2018-02-05 MED ORDER — SODIUM CHLORIDE 0.9% FLUSH
3.0000 mL | Freq: Two times a day (BID) | INTRAVENOUS | Status: DC
  Administered 2018-02-05: 3 mL via INTRAVENOUS

## 2018-02-05 MED ORDER — SODIUM CHLORIDE 0.9% FLUSH: 3.0000 mL | INTRAVENOUS | Status: DC | PRN

## 2018-02-05 MED ORDER — SODIUM CHLORIDE 0.9 % IV SOLN: 250.0000 mL | INTRAVENOUS | Status: DC | PRN

## 2018-02-05 NOTE — H&P (View-Only) (Signed)
Progress Note  Patient Name: [name] Date of Encounter: 02/05/2018  Primary Cardiologist: No primary care provider on file.  Subjective   Breathing has improved.  Lying flat in bed without dyspnea. Abdominal pain is also improved.  Still has not had a bowel movement, minimal flatus.  Inpatient Medications    Scheduled Meds: . aspirin  324 mg Oral Once  . aspirin EC  81 mg Oral Daily  . enoxaparin (LOVENOX) injection  40 mg Subcutaneous Q24H  . escitalopram  20 mg Oral QHS  . folic acid  1 mg Oral Daily   Continuous Infusions:  PRN Meds: ondansetron **OR** ondansetron (ZOFRAN) IV, oxyCODONE   Vital Signs    Vitals:   02/04/18 2009 02/04/18 2145 02/05/18 0031 02/05/18 0450  BP: (!) 95/39 (!) 102/37 (!) 113/55 (!) 136/58  Pulse: (!) 58 61 67 66  Resp: 19  19 19   Temp: 98.2 F (36.8 C)  100.2 F (37.9 C) 99.7 F (37.6 C)  TempSrc: Oral  Oral Oral  SpO2: 91%  92% (!) 89%  Weight:    76.2 kg  Height:        Intake/Output Summary (Last 24 hours) at 02/05/2018 2505 Last data filed at 02/05/2018 0700 Gross per 24 hour  Intake 510 ml  Output 800 ml  Net -290 ml   Filed Weights   02/04/18 1206 02/04/18 1822 02/05/18 0450  Weight: 76 kg 75.6 kg 76.2 kg    Telemetry    SB - Personally Reviewed  ECG    SR - Personally Reviewed  Physical Exam   General: Thin older male appearing in no acute distress. Head: Normocephalic, atraumatic.  Neck: Supple, no JVD. Lungs:  Resp regular and unlabored, slight wheezing. Heart: RRR, S1, S2, 3/6 SEM at RUSB (C-D, high-pitched, harsh-radiates to carotids); no rub. Abdomen: Soft, non-tender, non-distended with normoactive bowel sounds.  Extremities: No clubbing, cyanosis, edema. Distal pedal pulses are 2+ bilaterally. Neuro: Alert and oriented X 3. Moves all extremities spontaneously. Psych: Normal affect.  Labs    Chemistry Recent Labs  Lab 02/04/18 1157 02/04/18 1545 02/05/18 0548  NA 137  --  135  K 4.1   --  3.9  CL 102  --  104  CO2 24  --  24  GLUCOSE 170*  --  111*  BUN 16  --  20  CREATININE 1.25* 1.36* 1.36*  CALCIUM 8.5*  --  8.0*  PROT 6.3*  --  6.7  ALBUMIN 3.5  --  3.4*  AST 472*  --  238*  ALT 424*  --  319*  ALKPHOS 100  --  138*  BILITOT 3.1*  --  5.6*  GFRNONAA 50* 45* 45*  GFRAA 58* 52* 52*  ANIONGAP 11  --  7     Hematology Recent Labs  Lab 02/04/18 1157 02/04/18 1545 02/05/18 0446  WBC 9.6 8.9 8.0  RBC 4.16* 4.33 4.86  HGB 12.3* 12.7* 14.2  HCT 38.4* 39.9 44.7  MCV 92.3 92.1 92.0  MCH 29.6 29.3 29.2  MCHC 32.0 31.8 31.8  RDW 11.6 11.7 12.0  PLT 148* 143* PENDING    Cardiac Enzymes Recent Labs  Lab 02/04/18 1157 02/04/18 1545 02/04/18 2201 02/05/18 0548  TROPONINI <0.03 <0.03 <0.03 <0.03   No results for input(s): TROPIPOC in the last 168 hours.   BNP Recent Labs  Lab 02/04/18 1158  BNP 380.0*     DDimer No results for input(s): DDIMER in the last 168 hours.  Radiology    Abd 1 View (kub)  Result Date: 02/04/2018 CLINICAL DATA:  Abdominal distension. EXAM: ABDOMEN - 1 VIEW COMPARISON:  None. FINDINGS: The bowel gas pattern is normal. No radio-opaque calculi or other significant radiographic abnormality are seen. IMPRESSION: Negative. Electronically Signed   By: Dorise Bullion III M.D   On: 02/04/2018 16:59   Dg Chest Portable 1 View  Result Date: 02/04/2018 CLINICAL DATA:  Chest pain EXAM: PORTABLE CHEST 1 VIEW COMPARISON:  03/15/2017 chest radiograph. FINDINGS: Stable cardiomediastinal silhouette with normal heart size. No pneumothorax. No pleural effusion. Left upper lobe 6 mm pulmonary nodule is stable since 08/24/2016 chest CT and considered benign. No pulmonary edema. No acute consolidative airspace disease. IMPRESSION: No active cardiopulmonary disease. Electronically Signed   By: Ilona Sorrel M.D.   On: 02/04/2018 12:17    Cardiac Studies   TTE: pending (preliminary evaluation does suggest severe AS).  Patient Profile       84 y.o. male with PMH of moderate AS, HTN, HL, GERD, Hep A (as a child), and depression who presented with chest pain and dyspnea.   Assessment & Plan    1. Acute on Chronic diastolic HF with hx of AS: Presented with worsening shortness of breath and chest pain. Echo back on 2018 showed concern for worsening AS.   Getting echo at the time of assessment. Would not a good surgical candidate but probably appropriate for TAVR pending GI work up. -->  Will ask the valve team to see him while he is inpatient if severe AS noted.  Does not appear volume overloaded on exam. Will add low dose oral lasix 20mg  daily today.   2. Abd pain: LFTs are trending down, Bili up from 3.1>>5.6 today. KUB was negative, with [place] abd pending. Work up per primary.    3. HTN: blood pressures are improved today. Will hold on adding back today. Follow up in the am.   4. HL: statin on hold given elevated LFTs.   Signed, Reino Bellis, NP  02/05/2018, 9:03 AM  Pager # [phone]   I have seen, examined and evaluated the patient this AM along with Reino Bellis, NP-C.  After reviewing all the available data and chart, we discussed the patients laboratory, study & physical findings as well as symptoms in detail. I agree with her findings, examination as well as impression recommendations as per our discussion.    Attending adjustments noted in italics.   Mostly awaiting echocardiogram results.  For now, agree with low-dose diuretic. Restart blood pressure medications as he stabilizes.    Glenetta Hew, M.D., M.S. Interventional Cardiologist   Pager # [phone] Phone # [phone] [address] [place], [place] [address]     For questions or updates, please contact [place] Please consult [email or web address] for contact info under Cardiology/STEMI.

## 2018-02-05 NOTE — Progress Notes (Addendum)
I told patient  not to eat or drink anything that he is going for an abdominal ultrasound later on today. Patient has been NPO since this time.  Daughter with him at bedside.  Patient had an episode of being shaky when he got out of bed around 1120am, vitals were obtained and MD notified. Will continue to monitor.

## 2018-02-05 NOTE — Progress Notes (Addendum)
Progress Note  Patient Name: [name] Date of Encounter: 02/05/2018  Primary Cardiologist: No primary care provider on file.  Subjective   Breathing has improved.  Lying flat in bed without dyspnea. Abdominal pain is also improved.  Still has not had a bowel movement, minimal flatus.  Inpatient Medications    Scheduled Meds: . aspirin  324 mg Oral Once  . aspirin EC  81 mg Oral Daily  . enoxaparin (LOVENOX) injection  40 mg Subcutaneous Q24H  . escitalopram  20 mg Oral QHS  . folic acid  1 mg Oral Daily   Continuous Infusions:  PRN Meds: ondansetron **OR** ondansetron (ZOFRAN) IV, oxyCODONE   Vital Signs    Vitals:   02/04/18 2009 02/04/18 2145 02/05/18 0031 02/05/18 0450  BP: (!) 95/39 (!) 102/37 (!) 113/55 (!) 136/58  Pulse: (!) 58 61 67 66  Resp: 19  19 19   Temp: 98.2 F (36.8 C)  100.2 F (37.9 C) 99.7 F (37.6 C)  TempSrc: Oral  Oral Oral  SpO2: 91%  92% (!) 89%  Weight:    76.2 kg  Height:        Intake/Output Summary (Last 24 hours) at 02/05/2018 2751 Last data filed at 02/05/2018 0700 Gross per 24 hour  Intake 510 ml  Output 800 ml  Net -290 ml   Filed Weights   02/04/18 1206 02/04/18 1822 02/05/18 0450  Weight: 76 kg 75.6 kg 76.2 kg    Telemetry    SB - Personally Reviewed  ECG    SR - Personally Reviewed  Physical Exam   General: Thin older male appearing in no acute distress. Head: Normocephalic, atraumatic.  Neck: Supple, no JVD. Lungs:  Resp regular and unlabored, slight wheezing. Heart: RRR, S1, S2, 3/6 SEM at RUSB (C-D, high-pitched, harsh-radiates to carotids); no rub. Abdomen: Soft, non-tender, non-distended with normoactive bowel sounds.  Extremities: No clubbing, cyanosis, edema. Distal pedal pulses are 2+ bilaterally. Neuro: Alert and oriented X 3. Moves all extremities spontaneously. Psych: Normal affect.  Labs    Chemistry Recent Labs  Lab 02/04/18 1157 02/04/18 1545 02/05/18 0548  NA 137  --  135  K 4.1   --  3.9  CL 102  --  104  CO2 24  --  24  GLUCOSE 170*  --  111*  BUN 16  --  20  CREATININE 1.25* 1.36* 1.36*  CALCIUM 8.5*  --  8.0*  PROT 6.3*  --  6.7  ALBUMIN 3.5  --  3.4*  AST 472*  --  238*  ALT 424*  --  319*  ALKPHOS 100  --  138*  BILITOT 3.1*  --  5.6*  GFRNONAA 50* 45* 45*  GFRAA 58* 52* 52*  ANIONGAP 11  --  7     Hematology Recent Labs  Lab 02/04/18 1157 02/04/18 1545 02/05/18 0446  WBC 9.6 8.9 8.0  RBC 4.16* 4.33 4.86  HGB 12.3* 12.7* 14.2  HCT 38.4* 39.9 44.7  MCV 92.3 92.1 92.0  MCH 29.6 29.3 29.2  MCHC 32.0 31.8 31.8  RDW 11.6 11.7 12.0  PLT 148* 143* PENDING    Cardiac Enzymes Recent Labs  Lab 02/04/18 1157 02/04/18 1545 02/04/18 2201 02/05/18 0548  TROPONINI <0.03 <0.03 <0.03 <0.03   No results for input(s): TROPIPOC in the last 168 hours.   BNP Recent Labs  Lab 02/04/18 1158  BNP 380.0*     DDimer No results for input(s): DDIMER in the last 168 hours.  Radiology    Abd 1 View (kub)  Result Date: 02/04/2018 CLINICAL DATA:  Abdominal distension. EXAM: ABDOMEN - 1 VIEW COMPARISON:  None. FINDINGS: The bowel gas pattern is normal. No radio-opaque calculi or other significant radiographic abnormality are seen. IMPRESSION: Negative. Electronically Signed   By: Dorise Bullion III M.D   On: 02/04/2018 16:59   Dg Chest Portable 1 View  Result Date: 02/04/2018 CLINICAL DATA:  Chest pain EXAM: PORTABLE CHEST 1 VIEW COMPARISON:  03/15/2017 chest radiograph. FINDINGS: Stable cardiomediastinal silhouette with normal heart size. No pneumothorax. No pleural effusion. Left upper lobe 6 mm pulmonary nodule is stable since 08/24/2016 chest CT and considered benign. No pulmonary edema. No acute consolidative airspace disease. IMPRESSION: No active cardiopulmonary disease. Electronically Signed   By: Ilona Sorrel M.D.   On: 02/04/2018 12:17    Cardiac Studies   TTE: pending (preliminary evaluation does suggest severe AS).  Patient Profile       84 y.o. male with PMH of moderate AS, HTN, HL, GERD, Hep A (as a child), and depression who presented with chest pain and dyspnea.   Assessment & Plan    1. Acute on Chronic diastolic HF with hx of AS: Presented with worsening shortness of breath and chest pain. Echo back on 2018 showed concern for worsening AS.   Getting echo at the time of assessment. Would not a good surgical candidate but probably appropriate for TAVR pending GI work up. -->  Will ask the valve team to see him while he is inpatient if severe AS noted.  Does not appear volume overloaded on exam. Will add low dose oral lasix 20mg  daily today.   2. Abd pain: LFTs are trending down, Bili up from 3.1>>5.6 today. KUB was negative, with [place] abd pending. Work up per primary.    3. HTN: blood pressures are improved today. Will hold on adding back today. Follow up in the am.   4. HL: statin on hold given elevated LFTs.   Signed, Reino Bellis, NP  02/05/2018, 9:03 AM  Pager # [phone]   I have seen, examined and evaluated the patient this AM along with Reino Bellis, NP-C.  After reviewing all the available data and chart, we discussed the patients laboratory, study & physical findings as well as symptoms in detail. I agree with her findings, examination as well as impression recommendations as per our discussion.    Attending adjustments noted in italics.   Mostly awaiting echocardiogram results.  For now, agree with low-dose diuretic. Restart blood pressure medications as he stabilizes.    Glenetta Hew, M.D., M.S. Interventional Cardiologist   Pager # [phone] Phone # [phone] [address] [place], [place] [address]     For questions or updates, please contact [place] Please consult [email or web address] for contact info under Cardiology/STEMI.

## 2018-02-05 NOTE — Progress Notes (Signed)
Preliminary notes--Bilateral carotid duplex exam completed. Right ICA within normal limits, Left ICA 1-39% stenosis according to the category of velocity.  Bilateral vertebral arteries patent with antegrade flow.  Hongying Zenobia Kuennen (RDMS RVT) 02/05/18 6:18 PM

## 2018-02-05 NOTE — Progress Notes (Signed)
[facility] PROGRESS NOTE  [name] KPV:[id number] DOB: [birth year] DOA: 02/04/2018  PCP: Lajean Manes, MD  Brief History/Interval Summary: 84 y.o. male with medical history significant of rheumatoid arthritis, aortic stenosis (last TTE in March showed a peak velocity of 4.69m/s), GERD, HTN, BPH, hepatitis A as a child who presented with dyspnea and chest pressure.  Patient is [place] speaking, he understands some [place] and speaks some [place].  In the ED, he received morphine and aspirin with improvement in his chest pressure.  He had blood work which showed a Troponin of < 0.03, BNP of 380 (up from 98 in March) Cr of 1.25 (up from 1.19 in March), elevated LFTs ~ 10X ULN with an elevated tBili of 3.1.  Patient was hospitalized for further management.  Reason for Visit: Chest pain.  Abnormal LFTs.  Consultants: Cardiology.  Procedures:   Transthoracic echocardiogram Study Conclusions  - Left ventricle: The cavity size was normal. Wall thickness was   increased in a pattern of moderate LVH. Systolic function was   vigorous. The estimated ejection fraction was in the range of 65%   to 70%. Wall motion was normal; there were no regional wall   motion abnormalities. Doppler parameters are consistent with   abnormal left ventricular relaxation (grade 1 diastolic   dysfunction). The E/e&' ratio is >15, suggesting elevated LV   filling pressure. - Aortic valve: Severe aortic stenosis. Trivial AI. Mean gradient   (S): 43 mm Hg. Peak gradient (S): 81 mm Hg. Valve area (VTI):   1.01 cm^2. Valve area (Vmax): 0.88 cm^2. Valve area (Vmean): 0.91   cm^2. - Mitral valve: Calcified annulus. Mildly thickened leaflets .   There was mild regurgitation. - Left atrium: The atrium was normal in size. - Tricuspid valve: There was mild regurgitation. - Pulmonary arteries: PA peak pressure: 46 mm Hg (S). - Inferior vena cava: The vessel was dilated. The respirophasic   diameter changes were  blunted (< 50%), consistent with elevated   central venous pressure.  Impressions:  - LVEF 65-70%, moderate LVH, normal wall motion, grade 1 DD,   elevated LV filling pressure, severe aortic stenosis - mean   gradient of 43 mmHg and calculated AVA of 0.9 cm2.  Antibiotics: None  Subjective/Interval History: Patient denies any chest pain this morning.  Minimally short of breath.  Denies any abdominal pain, nausea or vomiting.  ROS: Denies any headaches  Objective:  Vital Signs  Vitals:   02/04/18 2145 02/05/18 0031 02/05/18 0450 02/05/18 1053  BP: (!) 102/37 (!) 113/55 (!) 136/58 (!) 142/62  Pulse: 61 67 66 62  Resp:  19 19   Temp:  100.2 F (37.9 C) 99.7 F (37.6 C) 97.8 F (36.6 C)  TempSrc:  Oral Oral Oral  SpO2:  92% (!) 89% 95%  Weight:   76.2 kg   Height:        Intake/Output Summary (Last 24 hours) at 02/05/2018 1120 Last data filed at 02/05/2018 0700 Gross per 24 hour  Intake 510 ml  Output 800 ml  Net -290 ml   Filed Weights   02/04/18 1206 02/04/18 1822 02/05/18 0450  Weight: 76 kg 75.6 kg 76.2 kg    General appearance: alert, cooperative, appears stated age and no distress Head: Normocephalic, without obvious abnormality, atraumatic Resp: Normal effort at rest.  Diminished air entry at the bases.  No crackles or wheezing.  No rhonchi. Cardio: S1-S2 is normal regular.  Systolic murmur appreciated over the aortic area. GI: soft,  non-tender; bowel sounds normal; no masses,  no organomegaly Extremities: extremities normal, atraumatic, no cyanosis or edema Neurologic: No focal neurological deficits appreciated.  Lab Results:  Data Reviewed: I have personally reviewed following labs and imaging studies  CBC: Recent Labs  Lab 02/04/18 1157 02/04/18 1545 02/05/18 0446  WBC 9.6 8.9 8.0  NEUTROABS 8.2*  --   --   HGB 12.3* 12.7* 14.2  HCT 38.4* 39.9 44.7  MCV 92.3 92.1 92.0  PLT 148* 143* 104*    Basic Metabolic Panel: Recent Labs  Lab  02/04/18 1157 02/04/18 1545 02/05/18 0548  NA 137  --  135  K 4.1  --  3.9  CL 102  --  104  CO2 24  --  24  GLUCOSE 170*  --  111*  BUN 16  --  20  CREATININE 1.25* 1.36* 1.36*  CALCIUM 8.5*  --  8.0*  MG 1.8  --   --     GFR: Estimated Creatinine Clearance: 35.8 mL/min (A) (by C-G formula based on SCr of 1.36 mg/dL (H)).  Liver Function Tests: Recent Labs  Lab 02/04/18 1157 02/05/18 0548  AST 472* 238*  ALT 424* 319*  ALKPHOS 100 138*  BILITOT 3.1* 5.6*  PROT 6.3* 6.7  ALBUMIN 3.5 3.4*     Coagulation Profile: Recent Labs  Lab 02/04/18 1157 02/05/18 0446  INR 1.14 1.17    Cardiac Enzymes: Recent Labs  Lab 02/04/18 1157 02/04/18 1545 02/04/18 2201 02/05/18 0548  TROPONINI <0.03 <0.03 <0.03 <0.03    HbA1C: Recent Labs    02/04/18 1545  HGBA1C 5.1    CBG: Recent Labs  Lab 02/04/18 1206 02/04/18 1635 02/04/18 2118 02/05/18 0754  GLUCAP 167* 102* 121* 111*    Thyroid Function Tests: Recent Labs    02/04/18 1545  TSH 5.467*    Radiology Studies: Abd 1 View (kub)  Result Date: 02/04/2018 CLINICAL DATA:  Abdominal distension. EXAM: ABDOMEN - 1 VIEW COMPARISON:  None. FINDINGS: The bowel gas pattern is normal. No radio-opaque calculi or other significant radiographic abnormality are seen. IMPRESSION: Negative. Electronically Signed   By: Dorise Bullion III M.D   On: 02/04/2018 16:59   Dg Chest Portable 1 View  Result Date: 02/04/2018 CLINICAL DATA:  Chest pain EXAM: PORTABLE CHEST 1 VIEW COMPARISON:  03/15/2017 chest radiograph. FINDINGS: Stable cardiomediastinal silhouette with normal heart size. No pneumothorax. No pleural effusion. Left upper lobe 6 mm pulmonary nodule is stable since 08/24/2016 chest CT and considered benign. No pulmonary edema. No acute consolidative airspace disease. IMPRESSION: No active cardiopulmonary disease. Electronically Signed   By: Ilona Sorrel M.D.   On: 02/04/2018 12:17     Medications:  Scheduled: .  aspirin  324 mg Oral Once  . aspirin EC  81 mg Oral Daily  . enoxaparin (LOVENOX) injection  40 mg Subcutaneous Q24H  . escitalopram  20 mg Oral QHS  . folic acid  1 mg Oral Daily  . furosemide  20 mg Oral Daily   Continuous:  OIN:OMVEHMCNOBS **OR** ondansetron (ZOFRAN) IV, oxyCODONE  Assessment/Plan:    Chest tightness with dyspnea and lightheadedness Symptoms likely due to aortic stenosis.  Echocardiogram as above.  Patient is being followed by cardiology.  Patient has ruled out for ACS by serial troponins.  Continue aspirin.  Started on low-dose furosemide by cardiology.  Abnormal LFTs Patient noted to have elevated AST ALT and bilirubin.  Alkaline phosphatase also minimally elevated.  Hepatitis panel is pending.  Fractionate bilirubin.  Patient  does not drink alcohol heavily.  Abnormal LFTs could be due to cardiac congestion.  However patient was experiencing some abdominal bloating and nausea yesterday.  None today.  Abdomen is benign.  We will order ultrasound.  AST ALT noted to be mildly better today compared to yesterday.  Continue to trend.  Essential hypertension Blood pressures closely.  Some of his antihypertensives are on hold.  Sinus bradycardia TSH noted to be mildly elevated.  Unlikely he has significant hypothyroidism.  Recommend this be rechecked in the outpatient setting.  History of dysphagia Eat small bites and slowly.  Hyperglycemia Levels noted to be mildly elevated.  HbA1c 5.1.   DVT Prophylaxis: Lovenox    Code Status: Full code Family Communication: Discussed with the patient and his daughter Disposition Plan: Management as outlined above.  Await abdominal ultrasound.    LOS: 1 day   [facility] Pager [phone] 02/05/2018, 11:20 AM  If 7PM-7AM, please contact night-coverage at [email or web address], password [facility]

## 2018-02-05 NOTE — Consult Note (Addendum)
[facility]  Inpatient TAVR Consultation:   Patient IDNiel Boyle; [id number]; Jan 08, 1928   Admit date: 02/04/2018 Date of Consult: 02/05/2018  Primary Care Provider: Lajean Manes, MD Primary Cardiologist: Dr. Marlou Porch    Patient Profile:   [name] is a 84 y.o. male with a hx of HTN, HLD, RA on MTX, sinus bradycardia, depression, moderate aortic stenosis who is being seen today for the evaluation of severe AS at the request of Dr. Johnsie Cancel.  History of Present Illness:   Mr. Bruins is originally from [place]. He moved to the [place] in 1991. 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 He retired in 1998 and lost his wife in 2012. He has two daughters, one of which lives locally four houses down. His daughter and niece ( a physician in home country and previous cardiology PA here) serve as his translator today. He lives alone and takes care of all of his own cooking and cleaning. He is very active in the yard and his garden. He stays very active and loves to cook elaborate meals. He also exercises with a physical therapist in his house several times a week. Of note, he has full dentures.   He has a history of dyspnea and non cardiac chest pain with normal cath in 2012. He has had known aortic stenosis that was felt to be in the moderate range followed by Dr. Melvern Banker and then Dr. Marlou Porch. He was last seen in our office in 02/2015.   He had a prolonged admission in 08/2016 after a mechanical fall when he fell from a tree, while trying to stop squirrels from stealing his apples. He suffered a right pneumothorax and costochondral fractures as well as vertebral fractures.  The patient underwent a right thoracentesis for right pleural effusion.  He also had a pneumoperitoneum and was transfused blood.  He was also diagnosed with a small DVT in the right posterior tibial vein.  He was  treated with Lovenox and then Xarelto.  Transthoracic echocardiogram was done during this admission which showed a poorly visualized aortic valve but elevated velocities with a mean gradient of 37 mmHg.  Per daughter and niece, he miraculously recovered from this accident and regained physical strength and got back to his baseline physical status.  He was in his usual state of health until last Saturday when he had abrupt onset of abdominal bloating and nausea and vomiting.  He also noticed shortness of breath and elevated blood pressure.  He took an extra two losartan. The pain then progressed into his chest and he felt like he had a "rock on his chest".  He also developed orthopnea and could not lie flat because he felt like he was suffering but denies PND.  He denies palpitations.  He has never had dizziness until yesterday in the hospital.  No syncope.  He also admits to having left arm and shoulder pain prior to admission.  ED Course: In the ED, he received morphine and aspirin with improvement in his chest pressure.  Labs revealed troponin of < 0.03, BNP of 380 (up from 98 in March) Cr of 1.25 (up from 1.19 in March), elevated LFTs ~ 10X ULN with an elevated tBili of 3.1.  He has a mild anemia, but normal WBC and Platelets were 148.  He had a chest xray which showed no active cardiopulmonary disease and a stable pulmonary  nodule.  EKG with NSR and no acute ischemic changes.   He was admitted for further work-up.  He was given 1 dose of IV Lasix with improvement in his abdominal bloating and symptoms.  However, per daughter the patient is very lethargic and just "not himself ."  She says he has a history of in-hospital delirium.  Abdominal x-ray was nonacute and internal medicine has ordered abdominal ultrasound for further work-up of his elevated LFTs and elevated bilirubin.  Of note, the patient has a chart history of Gilbert syndrome.  Echocardiogram today shows EF 60 to 70%, G1 DD, severe aortic  stenosis with mean gradient 43 mmHg and peak gradient 81 mmHg.  The structural heart team is evaluated for possible TAVR work-up.  Past Medical History:  Diagnosis Date  . BPH (benign prostatic hypertrophy)   . Depression   . Diverticulosis of colon   . GERD (gastroesophageal reflux disease)   . Gilbert's syndrome   . History of kidney stones   . HOH (hard of hearing)    refuses to wear his Hearing Aid  . Hypertension   . Inguinal hernia    right  . Internal hemorrhoid, bleeding   . Normal coronary arteries    a. Cath 07/2010: normal coronaries. Felt to have noncardiac CP/SOB at that time possibly r/t anxiety.  . RA (rheumatoid arthritis) ([place])    bilateral hands/ wrist--  seronegative  . Severe aortic stenosis   . Thyroid nodule    noted 11-2009  . Wears dentures     Past Surgical History:  Procedure Laterality Date  . CARDIAC CATHETERIZATION  07-19-2010  dr Tressia Miners turner   normal coronary arteries,  ef 60%,  moderate aortic stenosis- gradient 45mmHg, normal right heart pressure  . CARDIOVASCULAR STRESS TEST  05/ 2011   dr Marlou Porch   normal low risk perfusion study  . CATARACT EXTRACTION W/ INTRAOCULAR LENS  IMPLANT, BILATERAL  2013  . COLONOSCOPY  2010  approx  . THYROID LOBECTOMY Right 05/05/2015   Procedure: RIGHT THYROID LOBECTOMY;  Surgeon: Armandina Gemma, MD;  Location: [facility];  Service: General;  Laterality: Right;  . TRANSANAL HEMORRHOIDAL DEARTERIALIZATION N/A 04/09/2014   Procedure: TRANSANAL HEMORRHOIDAL DEARTERIALIZATION OF INTERNAL HEMORROIDS;  Surgeon: Leighton Ruff, MD;  Location: [facility];  Service: General;  Laterality: N/A;  . TRANSTHORACIC ECHOCARDIOGRAM  11-26-2013   moderate focal basal and mild LVH/  ef 87-56%/  grade I diastolic dysfunction/ mild LAE/  moderate calcification with stenosis AV with mild regurg,  gradients 35 abd 58 mmHg /  mild TR     Inpatient Medications: Scheduled Meds: . aspirin  324 mg Oral Once  . aspirin EC  81 mg  Oral Daily  . enoxaparin (LOVENOX) injection  40 mg Subcutaneous Q24H  . escitalopram  20 mg Oral QHS  . folic acid  1 mg Oral Daily  . furosemide  20 mg Oral Daily   Continuous Infusions:  PRN Meds: ondansetron **OR** ondansetron (ZOFRAN) IV, oxyCODONE  Allergies:    Allergies  Allergen Reactions  . Ativan [Lorazepam] Other (See Comments)    hallucinations  . Phenergan [Promethazine Hcl] Hypertension  . Citrus Rash    Small rash after eating for several days in a row  . Other Rash    Reaction to Honey    Social History:   Social History   Socioeconomic History  . Marital status: Widowed    Spouse name: Not on file  . Number of children: Not on  file  . Years of education: Not on file  . Highest education level: Not on file  Occupational History  . Not on file  Social Needs  . Financial resource strain: Not on file  . Food insecurity:    Worry: Not on file    Inability: Not on file  . Transportation needs:    Medical: Not on file    Non-medical: Not on file  Tobacco Use  . Smoking status: Former Smoker    Years: 40.00    Types: Cigarettes    Start date: 06/13/1973    Last attempt to quit: 04/03/1981    Years since quitting: 36.8  . Smokeless tobacco: Never Used  Substance and Sexual Activity  . Alcohol use: Yes    Comment: occasional  . Drug use: No  . Sexual activity: Not on file  Lifestyle  . Physical activity:    Days per week: Not on file    Minutes per session: Not on file  . Stress: Not on file  Relationships  . Social connections:    Talks on phone: Not on file    Gets together: Not on file    Attends religious service: Not on file    Active member of club or organization: Not on file    Attends meetings of clubs or organizations: Not on file    Relationship status: Not on file  . Intimate partner violence:    Fear of current or ex partner: Not on file    Emotionally abused: Not on file    Physically abused: Not on file    Forced sexual  activity: Not on file  Other Topics Concern  . Not on file  Social History Narrative  . Not on file    Family History:   The patient's family history includes Breast cancer in his sister; CVA in his father; Diabetes in his brother and brother; Pulmonary embolism (age of onset: 108) in his mother.  ROS:  Please see the history of present illness.  ROS  All other ROS reviewed and negative.     Physical Exam/Data:   Vitals:   02/05/18 0031 02/05/18 0450 02/05/18 1053 02/05/18 1127  BP: (!) 113/55 (!) 136/58 (!) 142/62 (!) 142/69  Pulse: 67 66 62 68  Resp: 19 19    Temp: 100.2 F (37.9 C) 99.7 F (37.6 C) 97.8 F (36.6 C) 98.1 F (36.7 C)  TempSrc: Oral Oral Oral Oral  SpO2: 92% (!) 89% 95% 97%  Weight:  76.2 kg    Height:        Intake/Output Summary (Last 24 hours) at 02/05/2018 1151 Last data filed at 02/05/2018 0700 Gross per 24 hour  Intake 510 ml  Output 800 ml  Net -290 ml   Filed Weights   02/04/18 1206 02/04/18 1822 02/05/18 0450  Weight: 76 kg 75.6 kg 76.2 kg   Body mass index is 26.31 kg/m.  General:  Well nourished, well developed, in no acute distress HEENT: normal Lymph: no adenopathy Neck: no JVD Endocrine:  No thryomegaly Vascular: No carotid bruits; FA pulses 2+ bilaterally without bruits  Cardiac:  normal S1, S2; RRR; 3/6 harsh SEM. Lungs:  clear to auscultation bilaterally, no wheezing, rhonchi or rales  Abd: soft, nontender, no hepatomegaly  Ext: no edema Musculoskeletal:  No deformities, BUE and BLE strength normal and equal Skin: warm and dry  Neuro:  CNs 2-12 intact, no focal abnormalities noted Psych:  Normal affect   EKG:  The EKG  was personally reviewed and demonstrates: NSR HR 68 Telemetry:  Telemetry was personally reviewed and demonstrates:  Sinus with intermittent sinus bradycardia  Relevant CV Studies: Cath 2012 ASSESSMENT: 1. Moderate aortic stenosis. 2. Normal coronary arteries. 3. Normal left ventricular function. 4.  Normal right heart pressures.  2D ECHO 02/05/2018 Study Conclusions - Left ventricle: The cavity size was normal. Wall thickness was   increased in a pattern of moderate LVH. Systolic function was   vigorous. The estimated ejection fraction was in the range of 65%   to 70%. Wall motion was normal; there were no regional wall   motion abnormalities. Doppler parameters are consistent with   abnormal left ventricular relaxation (grade 1 diastolic   dysfunction). The E/e&' ratio is >15, suggesting elevated LV   filling pressure. - Aortic valve: Severe aortic stenosis. Trivial AI. Mean gradient   (S): 43 mm Hg. Peak gradient (S): 81 mm Hg. Valve area (VTI):   1.01 cm^2. Valve area (Vmax): 0.88 cm^2. Valve area (Vmean): 0.91   cm^2. - Mitral valve: Calcified annulus. Mildly thickened leaflets .   There was mild regurgitation. - Left atrium: The atrium was normal in size. - Tricuspid valve: There was mild regurgitation. - Pulmonary arteries: PA peak pressure: 46 mm Hg (S). - Inferior vena cava: The vessel was dilated. The respirophasic   diameter changes were blunted (< 50%), consistent with elevated   central venous pressure. Impressions: - LVEF 65-70%, moderate LVH, normal wall motion, grade 1 DD,   elevated LV filling pressure, severe aortic stenosis - mean   gradient of 43 mmHg and calculated AVA of 0.9 cm2.   Laboratory Data:  Chemistry Recent Labs  Lab 02/04/18 1157 02/04/18 1545 02/05/18 0548  NA 137  --  135  K 4.1  --  3.9  CL 102  --  104  CO2 24  --  24  GLUCOSE 170*  --  111*  BUN 16  --  20  CREATININE 1.25* 1.36* 1.36*  CALCIUM 8.5*  --  8.0*  GFRNONAA 50* 45* 45*  GFRAA 58* 52* 52*  ANIONGAP 11  --  7    Recent Labs  Lab 02/04/18 1157 02/05/18 0548  PROT 6.3* 6.7  ALBUMIN 3.5 3.4*  AST 472* 238*  ALT 424* 319*  ALKPHOS 100 138*  BILITOT 3.1* 5.6*   Hematology Recent Labs  Lab 02/04/18 1157 02/04/18 1545 02/05/18 0446  WBC 9.6 8.9 8.0  RBC  4.16* 4.33 4.86  HGB 12.3* 12.7* 14.2  HCT 38.4* 39.9 44.7  MCV 92.3 92.1 92.0  MCH 29.6 29.3 29.2  MCHC 32.0 31.8 31.8  RDW 11.6 11.7 12.0  PLT 148* 143* 104*   Cardiac Enzymes Recent Labs  Lab 02/04/18 1157 02/04/18 1545 02/04/18 2201 02/05/18 0548  TROPONINI <0.03 <0.03 <0.03 <0.03   No results for input(s): TROPIPOC in the last 168 hours.  BNP Recent Labs  Lab 02/04/18 1158  BNP 380.0*    DDimer No results for input(s): DDIMER in the last 168 hours.  Radiology/Studies:  Abd 1 View (kub)  Result Date: 02/04/2018 CLINICAL DATA:  Abdominal distension. EXAM: ABDOMEN - 1 VIEW COMPARISON:  None. FINDINGS: The bowel gas pattern is normal. No radio-opaque calculi or other significant radiographic abnormality are seen. IMPRESSION: Negative. Electronically Signed   By: Dorise Bullion III M.D   On: 02/04/2018 16:59   Dg Chest Portable 1 View  Result Date: 02/04/2018 CLINICAL DATA:  Chest pain EXAM: PORTABLE CHEST 1 VIEW COMPARISON:  03/15/2017 chest radiograph. FINDINGS: Stable cardiomediastinal silhouette with normal heart size. No pneumothorax. No pleural effusion. Left upper lobe 6 mm pulmonary nodule is stable since 08/24/2016 chest CT and considered benign. No pulmonary edema. No acute consolidative airspace disease. IMPRESSION: No active cardiopulmonary disease. Electronically Signed   By: Ilona Sorrel M.D.   On: 02/04/2018 12:17     STS Risk Calculator: Procedure: AV Replacement   Risk of Mortality:  3.042%   Renal Failure:  2.839%   Permanent Stroke:  1.692%   Prolonged Ventilation:  9.205%   DSW Infection:  0.096%   Reoperation:  5.355%   Morbidity or Mortality:  15.401%   Short Length of Stay:  28.416%   Long Length of Stay:  7.355%     Assessment and Plan:   Kobyn Kray is a 84 y.o. male with symptoms of severe, stage D1 aortic stenosis with NYHA Class III symptoms. I have reviewed the patient's recent echocardiogram which is notable for normal LV systolic  function and severe aortic stenosis with peak gradient of 26mmHg and mean transvalvular gradient of 43 mm Hg. The patient's dimensionless index is 0.4 and calculated aortic valve area is 0.91 cm.   I have reviewed the natural history of aortic stenosis with the patient and his daughter/niece. We have discussed the limitations of medical therapy and the poor prognosis associated with symptomatic aortic stenosis. We have reviewed potential treatment options, including palliative medical therapy, conventional surgical aortic valve replacement, and transcatheter aortic valve replacement. We discussed treatment options in the context of this patient's specific comorbid medical conditions.   The patient is a very active 84 year old who lives alone and functions independently.  He was in his usual state of health until this past Saturday when he had acute onset of chest pain or shortness of breath as well as abdominal bloating, nausea and vomiting.  Internal medicine admitted him and is working up elevated LFTs and bilirubin.  Per family, who gives most the history, he has had waxing and waning mental status since yesterday, which is very unlike him.  He appears stable from a cardiac standpoint and received 1 dose of IV Lasix with improvement of symptoms.  He was started on Lasix 20 mg daily which I have discontinued given plan for right and left heart cath tomorrow.  The patient's predicted risk of mortality with conventional aortic valve replacement is 3.042% primarily based on age, acute heart failure, HTN, mild CHF. Other significant comorbid conditions include rhuematoid arthritis on methotrexate. TAVR seems like a reasonable treatment option for this patient pending formal cardiac surgical consultation. We discussed typical evaluation which will require a gated cardiac CTA and a CTA of the chest/abdomen/pelvis to evaluate both his cardiac anatomy and peripheral vasculature. Follow-up testing will be arranged  as an inpatient and outpatient. We are planning for [facility] tomorrow. NPO after midnight.   Dr. Angelena Form to follow.     Signed, Angelena Form, PA-C  02/05/2018 11:51 AM   I have personally seen and examined this patient. I agree with the assessment and plan as outlined above.  Mr. [name] is a pleasant 57 [place] speaking male. He has history of HTN, HLD and RA with known AS. His AS has been moderate over the past few years and has been followed by Dr. Marlou Porch. He is now admitted with chest pain and dyspnea. He was felt to be mildly volume overloaded and was diuresed.   He is now feeling much better. Echo 02/05/18 with LVEF=65-70%. Severe  AS with mean gradient 43 mmHg, peak gradient 81 mmHg. AVA 0.88cm2. DVI 0.35.   My exam:  General: Well developed, well nourished, NAD  HEENT: OP clear, mucus membranes moist  SKIN: warm, dry. No rashes. Neuro: No focal deficits  Musculoskeletal: Muscle strength 5/5 all ext  Psychiatric: Mood and affect normal  Neck: No JVD, no carotid bruits, no thyromegaly, no lymphadenopathy.  Lungs:Clear bilaterally, no wheezes, rhonci, crackles Cardiovascular: Regular rate and rhythm. Loud, harsh, late peaking systolic murmur.  Abdomen:Soft. Bowel sounds present. Non-tender.  Extremities: No lower extremity edema. Pulses are 2 + in the bilateral DP/PT.  Labs reviewed by me. EKG reviewed by me. Echo images reviewed by me.   Plan:   1. Severe aortic stenosis: He has severe, stage D aortic valve stenosis. I have personally reviewed the echo images. The aortic valve is thickened, calcified with limited leaflet mobility. I think he would benefit from AVR. Given advanced age, he is not a good candidate for conventional AVR by surgical approach. I think he may be a good candidate for TAVR. STS risk score 3.04%.   I have reviewed the natural history of aortic stenosis with the patient and their family members  who are present today. We have discussed the limitations of  medical therapy and the poor prognosis associated with symptomatic aortic stenosis. We have reviewed potential treatment options, including palliative medical therapy, conventional surgical aortic valve replacement, and transcatheter aortic valve replacement. We discussed treatment options in the context of the patient's specific comorbid medical conditions.   He would like to proceed with planning for TAVR. I will perform a right and left heart catheterization at [facility] today. Risks and benefits of procedure reviewed with the patient. After the cath, we will continue planning for TAVR with a cardiac CT, CTA of the chest/abdomen and pelvis, PFTs, carotid dopplers and PT assessment.   Lauree Chandler 02/06/2018 11:04 AM

## 2018-02-05 NOTE — Progress Notes (Signed)
  Echocardiogram 2D Echocardiogram has been performed.  [name] 02/05/2018, 9:43 AM

## 2018-02-06 ENCOUNTER — Inpatient Hospital Stay (HOSPITAL_COMMUNITY): Payer: Medicare Other

## 2018-02-06 ENCOUNTER — Encounter (HOSPITAL_COMMUNITY)
Admission: EM | Disposition: A | Discharge status: Home Health | Payer: Self-pay | Source: Home / Self Care | Attending: Internal Medicine

## 2018-02-06 DIAGNOSIS — I350 Nonrheumatic aortic (valve) stenosis: Secondary | ICD-10-CM

## 2018-02-06 HISTORY — PX: RIGHT/LEFT HEART CATH AND CORONARY ANGIOGRAPHY: CATH118266

## 2018-02-06 LAB — CBC
HCT: 41.3 % (ref 39.0–52.0)
Hemoglobin: 13.1 g/dL (ref 13.0–17.0)
MCH: 29.1 pg (ref 26.0–34.0)
MCHC: 31.7 g/dL (ref 30.0–36.0)
MCV: 91.8 fL (ref 78.0–100.0)
PLATELETS: 117 10*3/uL — AB (ref 150–400)
RBC: 4.5 MIL/uL (ref 4.22–5.81)
RDW: 12 % (ref 11.5–15.5)
WBC: 6.6 10*3/uL (ref 4.0–10.5)

## 2018-02-06 LAB — POCT I-STAT 3, ART BLOOD GAS (G3+)
ACID-BASE DEFICIT: 1 mmol/L (ref 0.0–2.0)
Bicarbonate: 23.8 mmol/L (ref 20.0–28.0)
O2 SAT: 96 %
PO2 ART: 78 mmHg — AB (ref 83.0–108.0)
TCO2: 25 mmol/L (ref 22–32)
pCO2 arterial: 38 mmHg (ref 32.0–48.0)
pH, Arterial: 7.405 (ref 7.350–7.450)

## 2018-02-06 LAB — POCT I-STAT 3, VENOUS BLOOD GAS (G3P V)
Acid-Base Excess: 1 mmol/L (ref 0.0–2.0)
Bicarbonate: 26.6 mmol/L (ref 20.0–28.0)
O2 Saturation: 63 %
PCO2 VEN: 46 mmHg (ref 44.0–60.0)
PH VEN: 7.369 (ref 7.250–7.430)
TCO2: 28 mmol/L (ref 22–32)
pO2, Ven: 34 mmHg (ref 32.0–45.0)

## 2018-02-06 LAB — COMPREHENSIVE METABOLIC PANEL
ALT: 230 U/L — AB (ref 0–44)
AST: 133 U/L — ABNORMAL HIGH (ref 15–41)
Albumin: 3.3 g/dL — ABNORMAL LOW (ref 3.5–5.0)
Alkaline Phosphatase: 173 U/L — ABNORMAL HIGH (ref 38–126)
Anion gap: 11 (ref 5–15)
BUN: 24 mg/dL — ABNORMAL HIGH (ref 8–23)
CHLORIDE: 101 mmol/L (ref 98–111)
CO2: 25 mmol/L (ref 22–32)
CREATININE: 1.33 mg/dL — AB (ref 0.61–1.24)
Calcium: 8.4 mg/dL — ABNORMAL LOW (ref 8.9–10.3)
GFR, EST AFRICAN AMERICAN: 54 mL/min — AB (ref >60)
GFR, EST NON AFRICAN AMERICAN: 46 mL/min — AB (ref >60)
Glucose, Bld: 109 mg/dL — ABNORMAL HIGH (ref 70–99)
Potassium: 3.7 mmol/L (ref 3.5–5.1)
Sodium: 137 mmol/L (ref 135–145)
TOTAL PROTEIN: 6.7 g/dL (ref 6.5–8.1)
Total Bilirubin: 4.2 mg/dL — ABNORMAL HIGH (ref 0.3–1.2)

## 2018-02-06 LAB — GLUCOSE, CAPILLARY
GLUCOSE-CAPILLARY: 103 mg/dL — AB (ref 70–99)
GLUCOSE-CAPILLARY: 192 mg/dL — AB (ref 70–99)
GLUCOSE-CAPILLARY: 88 mg/dL (ref 70–99)

## 2018-02-06 LAB — HEPATITIS PANEL, ACUTE
HCV Ab: <0.1 s/co ratio (ref 0.0–0.9)
HEP B C IGM: NEGATIVE
Hep A IgM: NEGATIVE
Hepatitis B Surface Ag: NEGATIVE

## 2018-02-06 SURGERY — RIGHT/LEFT HEART CATH AND CORONARY ANGIOGRAPHY
Anesthesia: LOCAL

## 2018-02-06 MED ORDER — VERAPAMIL HCL 2.5 MG/ML IV SOLN
INTRAVENOUS | Status: DC | PRN
  Administered 2018-02-06: 10 mL via INTRA_ARTERIAL

## 2018-02-06 MED ORDER — VERAPAMIL HCL 2.5 MG/ML IV SOLN
INTRAVENOUS | Status: AC
  Filled 2018-02-06: qty 2

## 2018-02-06 MED ORDER — LIDOCAINE HCL (PF) 1 % IJ SOLN
INTRAMUSCULAR | Status: AC
  Filled 2018-02-06: qty 30

## 2018-02-06 MED ORDER — LIDOCAINE HCL (PF) 1 % IJ SOLN
INTRAMUSCULAR | Status: DC | PRN
  Administered 2018-02-06 (×2): 2 mL

## 2018-02-06 MED ORDER — IOPAMIDOL (ISOVUE-370) INJECTION 76%
INTRAVENOUS | Status: AC
  Filled 2018-02-06: qty 100

## 2018-02-06 MED ORDER — HEPARIN (PORCINE) IN NACL 1000-0.9 UT/500ML-% IV SOLN
INTRAVENOUS | Status: AC
  Filled 2018-02-06: qty 1000

## 2018-02-06 MED ORDER — ENOXAPARIN SODIUM 40 MG/0.4ML ~~LOC~~ SOLN
40.0000 mg | SUBCUTANEOUS | Status: DC
  Administered 2018-02-06: 40 mg via SUBCUTANEOUS
  Filled 2018-02-06: qty 0.4

## 2018-02-06 MED ORDER — HEPARIN (PORCINE) IN NACL 1000-0.9 UT/500ML-% IV SOLN
INTRAVENOUS | Status: DC | PRN
  Administered 2018-02-06: 500 mL

## 2018-02-06 MED ORDER — IOPAMIDOL (ISOVUE-370) INJECTION 76%
INTRAVENOUS | Status: DC | PRN
  Administered 2018-02-06: 50 mL via INTRA_ARTERIAL

## 2018-02-06 MED ORDER — HEPARIN SODIUM (PORCINE) 1000 UNIT/ML IJ SOLN
INTRAMUSCULAR | Status: DC | PRN
  Administered 2018-02-06: 4000 [IU] via INTRAVENOUS

## 2018-02-06 MED ORDER — SODIUM CHLORIDE 0.9 % IV SOLN: INTRAVENOUS | Status: DC

## 2018-02-06 MED ORDER — HEPARIN SODIUM (PORCINE) 1000 UNIT/ML IJ SOLN
INTRAMUSCULAR | Status: AC
  Filled 2018-02-06: qty 1

## 2018-02-06 MED ORDER — SODIUM CHLORIDE 0.9 % IV SOLN: 250.0000 mL | INTRAVENOUS | Status: DC | PRN

## 2018-02-06 MED ORDER — SODIUM CHLORIDE 0.9% FLUSH: 3.0000 mL | INTRAVENOUS | Status: DC | PRN

## 2018-02-06 MED ORDER — SENNOSIDES-DOCUSATE SODIUM 8.6-50 MG PO TABS
2.0000 | ORAL_TABLET | Freq: Every evening | ORAL | Status: DC | PRN
  Administered 2018-02-06: 2 via ORAL
  Filled 2018-02-06: qty 2

## 2018-02-06 MED ORDER — SODIUM CHLORIDE 0.9% FLUSH
3.0000 mL | Freq: Two times a day (BID) | INTRAVENOUS | Status: DC
  Administered 2018-02-07: 3 mL via INTRAVENOUS

## 2018-02-06 SURGICAL SUPPLY — 16 items
CATH 5FR JL3.5 JR4 ANG PIG MP (CATHETERS) ×2 IMPLANT
CATH BALLN WEDGE 5F 110CM (CATHETERS) ×2 IMPLANT
CATH INFINITI 5FR AL1 (CATHETERS) ×2 IMPLANT
DEVICE RAD COMP TR BAND LRG (VASCULAR PRODUCTS) ×2 IMPLANT
ELECT DEFIB PAD ADLT CADENCE (PAD) ×2 IMPLANT
GLIDESHEATH SLEND SS 6F .021 (SHEATH) ×2 IMPLANT
GUIDEWIRE .025 260CM (WIRE) ×2 IMPLANT
GUIDEWIRE ANGLED .035X260CM (WIRE) IMPLANT
GUIDEWIRE INQWIRE 1.5J.035X260 (WIRE) ×1 IMPLANT
INQWIRE 1.5J .035X260CM (WIRE) ×2
KIT HEART LEFT (KITS) ×2 IMPLANT
PACK CARDIAC CATHETERIZATION (CUSTOM PROCEDURE TRAY) ×2 IMPLANT
SHEATH GLIDE SLENDER 4/5FR (SHEATH) ×2 IMPLANT
TRANSDUCER W/STOPCOCK (MISCELLANEOUS) ×2 IMPLANT
TUBING CIL FLEX 10 FLL-RA (TUBING) ×2 IMPLANT
WIRE EMERALD ST .035X150CM (WIRE) ×2 IMPLANT

## 2018-02-06 NOTE — Interval H&P Note (Signed)
History and Physical Interval Note:  02/06/2018 10:45 AM  [name]  has presented today for cardiac cath with the diagnosis of Severe AS.  The various methods of treatment have been discussed with the patient and family. After consideration of risks, benefits and other options for treatment, the patient has consented to  Procedure(s): RIGHT/LEFT HEART CATH AND CORONARY ANGIOGRAPHY (N/A) as a surgical intervention .  The patient's history has been reviewed, patient examined, no change in status, stable for surgery.  I have reviewed the patient's chart and labs.  Questions were answered to the patient's satisfaction.    Cath Lab Visit (complete for each Cath Lab visit)  Clinical Evaluation Leading to the Procedure:   ACS: No.  Non-ACS:    Anginal Classification: CCS II  Anti-ischemic medical therapy: No Therapy  Non-Invasive Test Results: No non-invasive testing performed  Prior CABG: No previous CABG         Lauree Chandler

## 2018-02-06 NOTE — Progress Notes (Signed)
Received post Heart Cath. Patient alert and oriented, Right radial TR band with 11cc per report, site DCI level O. Right brachial with coban dressing CDI. Will monitor

## 2018-02-06 NOTE — Care Management Note (Signed)
Case Management Note  Patient Details  Name: [name] MRN: [id number] Date of Birth: 10/04/1927  Subjective/Objective:     Chest Pain              Action/Plan: Patient lives at home, speaks [place]; PCP: Lajean Manes, MD; has private insurance with Medicare; CM will continue to follow for progression of care.  Expected Discharge Date:     Possibly 02/09/2018             Expected Discharge Plan:  [place]  In-House Referral:   [facility]  Discharge planning Services  CM Consult  Status of Service:  In process, will continue to follow  Sherrilyn Rist [phone] 02/06/2018, 10:43 AM

## 2018-02-06 NOTE — Progress Notes (Signed)
Rehab Admissions Coordinator Note:  Patient was screened by Cleatrice Burke for appropriateness for an Inpatient Acute Rehab Consult per PT recommendation. Patient previously admitted to [facility] 08/2016 after his admission for polytrauma and returned home with family support. I would like to see how patient progresses over the next few days as medical workup proceeds prior to determining most appropriate rehab venue. I will follow. I recommend OT eval also.  Danne Baxter, RN, MSN Rehab Admissions Coordinator [phone] 02/06/2018 10:15 AM  I can be reached at [phone].

## 2018-02-06 NOTE — Progress Notes (Addendum)
[facility] PROGRESS NOTE  [name] JEH:[id number] DOB: 02-15-1928 DOA: 02/04/2018  PCP: Lajean Manes, MD  Brief History/Interval Summary: 84 y.o. male with medical history significant of rheumatoid arthritis, aortic stenosis (last TTE in March showed a peak velocity of 4.47m/s), GERD, HTN, BPH, hepatitis A as a child who presented with dyspnea and chest pressure.  Patient is [place] speaking, he understands some [place] and speaks some [place].  In the ED, he received morphine and aspirin with improvement in his chest pressure.  He had blood work which showed a Troponin of < 0.03, BNP of 380 (up from 98 in March) Cr of 1.25 (up from 1.19 in March), elevated LFTs ~ 10X ULN with an elevated tBili of 3.1.  Patient was hospitalized for further management.  Reason for Visit: Chest pain.  Abnormal LFTs.  Consultants: Cardiology.  Procedures:   Transthoracic echocardiogram Study Conclusions  - Left ventricle: The cavity size was normal. Wall thickness was   increased in a pattern of moderate LVH. Systolic function was   vigorous. The estimated ejection fraction was in the range of 65%   to 70%. Wall motion was normal; there were no regional wall   motion abnormalities. Doppler parameters are consistent with   abnormal left ventricular relaxation (grade 1 diastolic   dysfunction). The E/e&' ratio is >15, suggesting elevated LV   filling pressure. - Aortic valve: Severe aortic stenosis. Trivial AI. Mean gradient   (S): 43 mm Hg. Peak gradient (S): 81 mm Hg. Valve area (VTI):   1.01 cm^2. Valve area (Vmax): 0.88 cm^2. Valve area (Vmean): 0.91   cm^2. - Mitral valve: Calcified annulus. Mildly thickened leaflets .   There was mild regurgitation. - Left atrium: The atrium was normal in size. - Tricuspid valve: There was mild regurgitation. - Pulmonary arteries: PA peak pressure: 46 mm Hg (S). - Inferior vena cava: The vessel was dilated. The respirophasic   diameter changes were  blunted (< 50%), consistent with elevated   central venous pressure.  Impressions:  - LVEF 65-70%, moderate LVH, normal wall motion, grade 1 DD,   elevated LV filling pressure, severe aortic stenosis - mean   gradient of 43 mmHg and calculated AVA of 0.9 cm2.  Antibiotics: None  Subjective/Interval History: Patient denies any chest pain this morning.  No shortness of breath.  Denies any nausea vomiting or abdominal pain.    ROS: Denies any headaches  Objective:  Vital Signs  Vitals:   02/05/18 2025 02/06/18 0014 02/06/18 0501 02/06/18 0943  BP: (!) 133/56 (!) 127/51 (!) 127/53 131/61  Pulse: 64 (!) 54 (!) 58 (!) 57  Resp: 19 19 19    Temp: 100.1 F (37.8 C) 98.3 F (36.8 C) 99.3 F (37.4 C)   TempSrc: Oral Oral Oral   SpO2: 94% 97% 95% 93%  Weight:  75.2 kg    Height:        Intake/Output Summary (Last 24 hours) at 02/06/2018 1027 Last data filed at 02/06/2018 0900 Gross per 24 hour  Intake 511.78 ml  Output 700 ml  Net -188.22 ml   Filed Weights   02/04/18 1822 02/05/18 0450 02/06/18 0014  Weight: 75.6 kg 76.2 kg 75.2 kg    General appearance: He is awake alert.  In no distress Resp: Normal effort at rest.  Clear to auscultation bilaterally. Cardio: S1-S2 is normal regular.  Systolic murmur appreciated over the aortic area. GI: Abdomen is soft.  Nontender nondistended.  Bowel sounds are present.  No masses  organomegaly Extremities: No edema Neurologic: No focal neurological deficits  Lab Results:  Data Reviewed: I have personally reviewed following labs and imaging studies  CBC: Recent Labs  Lab 02/04/18 1157 02/04/18 1545 02/05/18 0446 02/06/18 0400  WBC 9.6 8.9 8.0 6.6  NEUTROABS 8.2*  --   --   --   HGB 12.3* 12.7* 14.2 13.1  HCT 38.4* 39.9 44.7 41.3  MCV 92.3 92.1 92.0 91.8  PLT 148* 143* 104* 117*    Basic Metabolic Panel: Recent Labs  Lab 02/04/18 1157 02/04/18 1545 02/05/18 0548 02/06/18 0400  NA 137  --  135 137  K 4.1  --  3.9  3.7  CL 102  --  104 101  CO2 24  --  24 25  GLUCOSE 170*  --  111* 109*  BUN 16  --  20 24*  CREATININE 1.25* 1.36* 1.36* 1.33*  CALCIUM 8.5*  --  8.0* 8.4*  MG 1.8  --   --   --     GFR: Estimated Creatinine Clearance: 36.6 mL/min (A) (by C-G formula based on SCr of 1.33 mg/dL (H)).  Liver Function Tests: Recent Labs  Lab 02/04/18 1157 02/05/18 0548 02/05/18 1427 02/06/18 0400  AST 472* 238*  --  133*  ALT 424* 319*  --  230*  ALKPHOS 100 138*  --  173*  BILITOT 3.1* 5.6* 5.4* 4.2*  PROT 6.3* 6.7  --  6.7  ALBUMIN 3.5 3.4*  --  3.3*     Coagulation Profile: Recent Labs  Lab 02/04/18 1157 02/05/18 0446  INR 1.14 1.17    Cardiac Enzymes: Recent Labs  Lab 02/04/18 1157 02/04/18 1545 02/04/18 2201 02/05/18 0548  TROPONINI <0.03 <0.03 <0.03 <0.03    HbA1C: Recent Labs    02/04/18 1545  HGBA1C 5.1    CBG: Recent Labs  Lab 02/05/18 1138 02/05/18 1402 02/05/18 1606 02/05/18 2145 02/06/18 0748  GLUCAP 126* 115* 121* 129* 103*    Thyroid Function Tests: Recent Labs    02/04/18 1545  TSH 5.467*    Radiology Studies: Abd 1 View (kub)  Result Date: 02/04/2018 CLINICAL DATA:  Abdominal distension. EXAM: ABDOMEN - 1 VIEW COMPARISON:  None. FINDINGS: The bowel gas pattern is normal. No radio-opaque calculi or other significant radiographic abnormality are seen. IMPRESSION: Negative. Electronically Signed   By: Dorise Bullion III M.D   On: 02/04/2018 16:59   US Abdomen Complete  Result Date: 02/06/2018 CLINICAL DATA:  Abdominal pain. EXAM: ABDOMEN ULTRASOUND COMPLETE COMPARISON:  KUB 02/04/2018.  CT 03/23/2017.  CT 08/13/2016. FINDINGS: Gallbladder: 2.5 scratched it 3.2 cm maximum diameter large nonshadowing echogenic focus is noted within the gallbladder. Although a gallbladder mass cannot be excluded. This most likely a sludge ball. 2.1 mm Common bile duct: Diameter: 5.7 mm. Liver: No focal lesion identified. Within normal limits in parenchymal  echogenicity. Portal vein is patent on color Doppler imaging with normal direction of blood flow towards the liver. IVC: No abnormality visualized. Pancreas: Visualized portion unremarkable. Spleen: Size and appearance within normal limits. Right Kidney: Length: 12.0 cm. Echogenicity within normal limits. No hydronephrosis visualized. 3.9 and 2.1 cm simple cysts. Left Kidney: Length: 12.7 cm. Echogenicity within normal limits. No hydronephrosis visualized. 2.2 cm simple cyst. Abdominal aorta: No aneurysm visualized. Other findings: None. IMPRESSION: 1. Prominent soft tissue density noted within the gallbladder most consistent with a sludge ball. No gallstones or biliary distention. No acute abnormality identified. 2.  Simple cysts both kidneys. Electronically Signed   By:  Liborio Negron Torres   On: 02/06/2018 09:17   Dg Chest Portable 1 View  Result Date: 02/04/2018 CLINICAL DATA:  Chest pain EXAM: PORTABLE CHEST 1 VIEW COMPARISON:  03/15/2017 chest radiograph. FINDINGS: Stable cardiomediastinal silhouette with normal heart size. No pneumothorax. No pleural effusion. Left upper lobe 6 mm pulmonary nodule is stable since 08/24/2016 chest CT and considered benign. No pulmonary edema. No acute consolidative airspace disease. IMPRESSION: No active cardiopulmonary disease. Electronically Signed   By: Ilona Sorrel M.D.   On: 02/04/2018 12:17     Medications:  Scheduled: . [START ON 02/07/2018] aspirin EC  81 mg Oral Daily  . enoxaparin (LOVENOX) injection  40 mg Subcutaneous Q24H  . escitalopram  20 mg Oral QHS  . folic acid  1 mg Oral Daily  . sodium chloride flush  3 mL Intravenous Q12H   Continuous: . sodium chloride    . sodium chloride 1 mL/kg/hr (02/06/18 0526)   AVW:UJWJXB chloride, ondansetron **OR** ondansetron (ZOFRAN) IV, oxyCODONE, sodium chloride flush  Assessment/Plan:    Chest tightness with dyspnea and lightheadedness Symptoms likely due to aortic stenosis.  Echocardiogram as above.   Cardiology was consulted.  Patient ruled out for acute coronary syndrome by serial troponins.  Continue aspirin.  Patient was started on low-dose furosemide but no held.  Patient to undergo cardiac catheterization today.    Abnormal LFTs Patient noted to have elevated AST ALT and bilirubin.  Alkaline phosphatase also minimally elevated.  Etiology for abnormal LFTs not clear but could be due to congestion from cardiac issues.  Hepatitis panel unremarkable.  Direct indirect bilirubin are equal.  Some of the abdomen does not show any biliary ductal dilatation.  No acute gallbladder issues however there is unspecified density within the gallbladder thought to be sludge.  We will discuss this further with radiology.  Discussed briefly with general surgery, no role for surgical intervention.  Patient is also asymptomatic and examination is benign.  May need to pursue further imaging studies.  LFTs are improving.    Essential hypertension BP is stable.  Some of his antihypertensives are on hold.  Sinus bradycardia TSH noted to be mildly elevated 4 6.  Unlikely he has significant hypothyroidism.  Recommend this be rechecked in the outpatient setting.  Mild acute renal failure Baseline renal function appears to be normal based on previous values.  BUN and creatinine noted to be mildly elevated.  Monitor urine output.  Continue to watch.  May need outpatient monitoring.  History of dysphagia Eat small bites and slowly.  Hyperglycemia Levels noted to be mildly elevated.  HbA1c 5.1.  ADDENDUM Discussed with the radiologist Dr. Register.  He reviewed the imaging studies again.  He does not think that doing MRCP will give additional information to what is seen on the ultrasound currently.  He recommends repeating the study in about 6 months time to see if there is any change.   DVT Prophylaxis: Lovenox    Code Status: Full code Family Communication: Discussed with the patient and his  daughter Disposition Plan: Management as outlined above.  Cardiac cath today.  May need additional imaging studies to evaluate abnormality noted on ultrasound.    LOS: 2 days   [facility] Pager [phone] 02/06/2018, 10:27 AM  If 7PM-7AM, please contact night-coverage at [email or web address], password [facility]

## 2018-02-06 NOTE — Evaluation (Signed)
Physical Therapy Evaluation Patient Details Name: [name] MRN: [id number] DOB: January 31, 1928 Today's Date: 02/06/2018   History of Present Illness  84 yo admitted with fatigue with severe AS pt with PMHx: HTN, HLD, RA on MTX, sinus bradycardia, depression, moderate aortic stenosis   Clinical Impression  PT pleasant and very willing to move around. Per pt and daughter (interpreter throughout session with interpreter release form signed) pt with acute decline in function with inability to walk and care for himself for normally very independent and active man. Pt with difficulty with all transfers with mod assist to stand, minguard for gait with slow speed 185' in 3min. Pt will benefit from acute therapy to maximize mobility, function, gait and safety to return to PLOF. PT has 2 supportive daughters but both work and one is visiting from [place]. PT with SpO2 90-93% with gait on RA with report of slight dizziness with transfers and midway through gait with SpO2 90% at these times and no orthostasis.  Orthostatic BPs  Supine 130/61  Sitting 129/60     Standing 131/51      6 Minute Walk Test  Distance - 185 ft      Pre    Post  BP     130/61    131/61  HR    55    56  SaO2    95    93  Modified Borg Dyspnea 2    7  RPE    10    14    5  Meter Walk Test  Trial   1) 41.6 sec   2) 31.3 sec  3) 26.8 sec  Average - 33.3 sec = .49 ft/sec   Clinical Frailty Scale - 7               Follow Up Recommendations CIR;Supervision/Assistance - 24 hour    Equipment Recommendations  None recommended by PT    Recommendations for Other Services       Precautions / Restrictions Precautions Precautions: Fall Restrictions Weight Bearing Restrictions: No      Mobility  Bed Mobility Overal bed mobility: Modified Independent             General bed mobility comments: with rail, increased time to roll to side then rise from surface  Transfers Overall transfer  level: Needs assistance   Transfers: Sit to/from Stand Sit to Stand: Mod assist         General transfer comment: assist to rise from surface, guarding to return to sitting  Ambulation/Gait Ambulation/Gait assistance: Min guard Gait Distance (Feet): 185 Feet Assistive device: Rolling walker (2 wheeled) Gait Pattern/deviations: Step-through pattern;Decreased stride length;Trunk flexed   Gait velocity interpretation: <1.8 ft/sec, indicate of risk for recurrent falls General Gait Details: pt with slow gait with improved speed after grossly 50' but reliant on RW and flexed posture. pt with cues to step into RW and increase stride and speed  Stairs            Wheelchair Mobility    Modified Rankin (Stroke Patients Only)       Balance Overall balance assessment: Needs assistance   Sitting balance-Leahy Scale: Good Sitting balance - Comments: sitting EOB     Standing balance-Leahy Scale: Poor Standing balance comment: reliant on RW with standing                             Pertinent Vitals/Pain Pain Assessment: No/denies pain  Home Living Family/patient expects to be discharged to:: Private residence Living Arrangements: Alone Available Help at Discharge: Family;Available PRN/intermittently Type of Home: House Home Access: Stairs to enter Entrance Stairs-Rails: None Entrance Stairs-Number of Steps: 3 Home Layout: One level Home Equipment: Grab bars - tub/shower;Walker - 2 wheels;Cane - single point;Adaptive equipment;Shower seat      Prior Function Level of Independence: Independent         Comments: driving     Hand Dominance   Dominant Hand: Right    Extremity/Trunk Assessment   Upper Extremity Assessment Upper Extremity Assessment: Generalized weakness    Lower Extremity Assessment Lower Extremity Assessment: Generalized weakness    Cervical / Trunk Assessment Cervical / Trunk Assessment: Kyphotic  Communication    Communication: Prefers language other than English(daughter as interpreter with release form signed)  Cognition Arousal/Alertness: Awake/alert Behavior During Therapy: WFL for tasks assessed/performed Overall Cognitive Status: Within Functional Limits for tasks assessed                                        General Comments      Exercises     Assessment/Plan    PT Assessment Patient needs continued PT services  PT Problem List Decreased strength;Decreased mobility;Decreased safety awareness;Decreased activity tolerance;Decreased balance;Decreased knowledge of use of DME;Cardiopulmonary status limiting activity       PT Treatment Interventions Gait training;Therapeutic exercise;Patient/family education;Stair training;Balance training;Functional mobility training;Neuromuscular re-education;DME instruction;Therapeutic activities    PT Goals (Current goals can be found in the Care Plan section)  Acute Rehab PT Goals Patient Stated Goal: return home and garden PT Goal Formulation: With patient/family Time For Goal Achievement: 02/20/18 Potential to Achieve Goals: Good    Frequency Min 3X/week   Barriers to discharge Decreased caregiver support      Co-evaluation               AM-PAC PT "6 Clicks" Daily Activity  Outcome Measure Difficulty turning over in bed (including adjusting bedclothes, sheets and blankets)?: A Lot Difficulty moving from lying on back to sitting on the side of the bed? : Unable Difficulty sitting down on and standing up from a chair with arms (e.g., wheelchair, bedside commode, etc,.)?: Unable Help needed moving to and from a bed to chair (including a wheelchair)?: A Little Help needed walking in hospital room?: A Little Help needed climbing 3-5 steps with a railing? : A Lot 6 Click Score: 12    End of Session Equipment Utilized During Treatment: Gait belt Activity Tolerance: Patient tolerated treatment well Patient left: in  bed;with call bell/phone within reach;with family/visitor present Nurse Communication: Mobility status PT Visit Diagnosis: Other abnormalities of gait and mobility (R26.89);Muscle weakness (generalized) (M62.81);Unsteadiness on feet (R26.81);Difficulty in walking, not elsewhere classified (R26.2)    Time: 3474-2595 PT Time Calculation (min) (ACUTE ONLY): 55 min   Charges:   PT Evaluation $PT Eval Moderate Complexity: 1 Mod PT Treatments $Gait Training: 8-22 mins $Therapeutic Activity: 8-22 mins        [address], [place]   [name] 02/06/2018, 10:01 AM

## 2018-02-07 ENCOUNTER — Inpatient Hospital Stay (HOSPITAL_COMMUNITY): Payer: Medicare Other

## 2018-02-07 ENCOUNTER — Encounter (HOSPITAL_COMMUNITY): Payer: Self-pay | Admitting: Cardiovascular Disease

## 2018-02-07 DIAGNOSIS — I350 Nonrheumatic aortic (valve) stenosis: Secondary | ICD-10-CM

## 2018-02-07 DIAGNOSIS — K5790 Diverticulosis of intestine, part unspecified, without perforation or abscess without bleeding: Secondary | ICD-10-CM | POA: Insufficient documentation

## 2018-02-07 DIAGNOSIS — R140 Abdominal distension (gaseous): Secondary | ICD-10-CM

## 2018-02-07 LAB — CBC
HEMATOCRIT: 38.6 % — AB (ref 39.0–52.0)
HEMOGLOBIN: 12.4 g/dL — AB (ref 13.0–17.0)
MCH: 29.4 pg (ref 26.0–34.0)
MCHC: 32.1 g/dL (ref 30.0–36.0)
MCV: 91.5 fL (ref 78.0–100.0)
Platelets: 131 10*3/uL — ABNORMAL LOW (ref 150–400)
RBC: 4.22 MIL/uL (ref 4.22–5.81)
RDW: 11.9 % (ref 11.5–15.5)
WBC: 5.7 10*3/uL (ref 4.0–10.5)

## 2018-02-07 LAB — BASIC METABOLIC PANEL
ANION GAP: 11 (ref 5–15)
BUN: 19 mg/dL (ref 8–23)
CALCIUM: 8.3 mg/dL — AB (ref 8.9–10.3)
CHLORIDE: 103 mmol/L (ref 98–111)
CO2: 23 mmol/L (ref 22–32)
Creatinine, Ser: 1.08 mg/dL (ref 0.61–1.24)
GFR calc Af Amer: >60 mL/min (ref >60)
GFR calc non Af Amer: 60 mL/min — ABNORMAL LOW (ref >60)
Glucose, Bld: 91 mg/dL (ref 70–99)
Potassium: 3.4 mmol/L — ABNORMAL LOW (ref 3.5–5.1)
Sodium: 137 mmol/L (ref 135–145)

## 2018-02-07 LAB — HEPATIC FUNCTION PANEL
ALK PHOS: 205 U/L — AB (ref 38–126)
ALT: 151 U/L — AB (ref 0–44)
AST: 71 U/L — ABNORMAL HIGH (ref 15–41)
Albumin: 2.9 g/dL — ABNORMAL LOW (ref 3.5–5.0)
BILIRUBIN DIRECT: 0.8 mg/dL — AB (ref 0.0–0.2)
BILIRUBIN INDIRECT: 1.3 mg/dL — AB (ref 0.3–0.9)
BILIRUBIN TOTAL: 2.1 mg/dL — AB (ref 0.3–1.2)
TOTAL PROTEIN: 6.1 g/dL — AB (ref 6.5–8.1)

## 2018-02-07 LAB — GLUCOSE, CAPILLARY
GLUCOSE-CAPILLARY: 96 mg/dL (ref 70–99)
Glucose-Capillary: 162 mg/dL — ABNORMAL HIGH (ref 70–99)

## 2018-02-07 LAB — MAGNESIUM: Magnesium: 2 mg/dL (ref 1.7–2.4)

## 2018-02-07 MED ORDER — IOPAMIDOL (ISOVUE-370) INJECTION 76%
100.0000 mL | Freq: Once | INTRAVENOUS | Status: AC | PRN
  Administered 2018-02-07: 100 mL via INTRAVENOUS

## 2018-02-07 MED ORDER — FUROSEMIDE 20 MG PO TABS: 20.0000 mg | ORAL_TABLET | Freq: Every day | ORAL | 11 refills | Status: DC

## 2018-02-07 MED ORDER — IOPAMIDOL (ISOVUE-370) INJECTION 76%
INTRAVENOUS | Status: AC
  Filled 2018-02-07: qty 100

## 2018-02-07 MED ORDER — POTASSIUM CHLORIDE CRYS ER 20 MEQ PO TBCR
40.0000 meq | EXTENDED_RELEASE_TABLET | Freq: Once | ORAL | Status: AC
  Administered 2018-02-07: 40 meq via ORAL
  Filled 2018-02-07: qty 2

## 2018-02-07 NOTE — Care Management Important Message (Signed)
Important Message  Patient Details  Name: [name] MRN: [id number] Date of Birth: 1927/10/02   Medicare Important Message Given:  Yes    Anne-Marie Genson P [place] 02/07/2018, 3:51 PM

## 2018-02-07 NOTE — Progress Notes (Signed)
Physical Therapy Treatment Patient Details Name: [name] MRN: [id number] DOB: 1927/07/02 Today's Date: 02/07/2018    History of Present Illness 84 yo admitted with fatigue with severe aortic stenosis pt with PMHx: HTN, HLD, RA on MTX, sinus bradycardia, depression, moderate aortic stenosis. Pt is from [place] (1989) and speaks very little [place].     PT Comments    Pt tolerating session well. HR well controlled in 60s BPM after 315ft AMB, some mild SOB which pt reports is similar to baseline. Gait speed ~4x faster than 1 day ago. Pt able to participate in gait based balance training without assistive device. Pt endorses some continued weakness and instability, but big overall improvement since yesteday. DAughter from [place] in attendance who provides [place] interpretation. PT recommendations upgraded to Home with HHPT services.     Follow Up Recommendations  Home health PT     Equipment Recommendations  Rolling walker with 5" wheels    Recommendations for Other Services       Precautions / Restrictions Precautions Precautions: Fall Restrictions Weight Bearing Restrictions: No    Mobility  Bed Mobility Overal bed mobility: Modified Independent             General bed mobility comments: additional time required  Transfers Overall transfer level: Needs assistance   Transfers: Sit to/from Stand Sit to Stand: Supervision         General transfer comment: RW during mobility, then able to perform hands free from chair without assistive device at Supervision (5x consecutively)   Ambulation/Gait Ambulation/Gait assistance: Min guard;Supervision Gait Distance (Feet): 375 Feet Assistive device: Rolling walker (2 wheeled) Gait Pattern/deviations: Step-through pattern;Decreased stride length;Trunk flexed Gait velocity: 0.63m/s this date (0.28m/s 1DA)  Gait velocity interpretation: 1.31 - 2.62 ft/sec, indicative of limited community ambulator General Gait  Details: improve stability and speed this session    Stairs             Wheelchair Mobility    Modified Rankin (Stroke Patients Only)       Balance     Sitting balance-Leahy Scale: Good       Standing balance-Leahy Scale: Good Standing balance comment: Able to perform 3x79ft side stepping Bilat, and 3x74ft retroAMB without AD or LOB, slow adn cautious                            Cognition Arousal/Alertness: Awake/alert Behavior During Therapy: WFL for tasks assessed/performed Overall Cognitive Status: Within Functional Limits for tasks assessed                                        Exercises Other Exercises Other Exercises: Retro AMB: 3x4ft no AD  Other Exercises: Left side stepping 3x57ft no AD  Other Exercises: Right side stepping 3x90ft no AD     General Comments        Pertinent Vitals/Pain Pain Assessment: No/denies pain    Home Living                      Prior Function            PT Goals (current goals can now be found in the care plan section) Acute Rehab PT Goals Patient Stated Goal: return home and garden PT Goal Formulation: With patient/family Time For Goal Achievement: 02/20/18 Potential to Achieve Goals: Good Progress towards PT  goals: Progressing toward goals    Frequency    Min 3X/week      PT Plan Current plan remains appropriate    Co-evaluation              AM-PAC PT "6 Clicks" Daily Activity  Outcome Measure  Difficulty turning over in bed (including adjusting bedclothes, sheets and blankets)?: A Little Difficulty moving from lying on back to sitting on the side of the bed? : A Little Difficulty sitting down on and standing up from a chair with arms (e.g., wheelchair, bedside commode, etc,.)?: A Little Help needed moving to and from a bed to chair (including a wheelchair)?: None Help needed walking in hospital room?: A Little Help needed climbing 3-5 steps with a railing? : A  Little 6 Click Score: 19    End of Session   Activity Tolerance: Patient tolerated treatment well;No increased pain Patient left: with call bell/phone within reach;with family/visitor present;in chair Nurse Communication: Mobility status PT Visit Diagnosis: Other abnormalities of gait and mobility (R26.89);Muscle weakness (generalized) (M62.81);Unsteadiness on feet (R26.81);Difficulty in walking, not elsewhere classified (R26.2)     Time: 0947-0962 PT Time Calculation (min) (ACUTE ONLY): 27 min  Charges:  $Therapeutic Exercise: 8-22 mins $Therapeutic Activity: 8-22 mins                     10:26 AM, 02/07/18 Etta Grandchild, PT, DPT Physical Therapist - [place] [phone] (Pager)  [phone] (Office)      Raford Brissett C 02/07/2018, 10:23 AM

## 2018-02-07 NOTE — Progress Notes (Addendum)
  [name] VALVE TEAM  Patient is undergoing ongoing TAVR work-up.  CT scans scheduled for this morning but there was a delay in the CT department due to a possible bedbug infestation, which is now resolved.  The patient just ate breakfast so we will have to wait a few hours before we can do the scans.  PFTs are scheduled.  I have asked respiratory to come to do them today.  He is cleared to be discharged home after CT scans today.  If PFTs are unable to be done, we will schedule them as an outpatient.  This should not delay his discharge.  Would send him home on a low-dose of Lasix 20 mg daily.  He is scheduled for cardiothoracic surgery follow-up on 9/12 with Dr. Roxy Manns.  Angelena Form PA-C  [facility]   [facility] will sign off.   Medication Recommendations:  Continue current cardiac meds Other recommendations (labs, testing, etc):  Per note above Follow up as an outpatient:  Set up per note above.  Glenetta Hew, MD

## 2018-02-07 NOTE — Care Management Note (Signed)
Case Management Note  Patient Details  Name: [name] MRN: [id number] Date of Birth: April 07, 1928  Subjective/Objective:  Chest pain                Action/Plan: Patient lives at home with family, he speaks [place], daughter at the bedside; PCP is Dr Felipa Eth; has private insurance with Medicare; pharmacy of choice is [facility] on [address]; daughter states no problem getting medication; [place] choice offered, daughter chose [facility]; [place] with [facility] called for arrangements; Rolling walker ordered and to be delivered to the room today prior to going home.  Expected Discharge Date:      Possibly 02/08/2018            Expected Discharge Plan:  [place]  Discharge planning Services  CM Consult Choice offered to:  Adult Children  DME Arranged:  Walker rolling DME Agency:  [place] Arranged:  RN, PT [facility] Agency:   [facility]  Status of Service:  In process, will continue to follow  Sherrilyn Rist [phone] 02/07/2018, 12:12 PM

## 2018-02-07 NOTE — Progress Notes (Signed)
Inpatient Rehabilitation Admissions Coordinator  Therapy has changed recommendations to [facility] At this time. He is no longer in need of an inpt rehab admission. I will sign off at this time.  Danne Baxter, RN, MSN Rehab Admissions Coordinator [phone] 02/07/2018 12:06 PM

## 2018-02-07 NOTE — Discharge Summary (Signed)
Physician Discharge Summary  [name] ALP:[id number] DOB: 1928-03-10 DOA: 02/04/2018  PCP: Lajean Manes, MD  Admit date: 02/04/2018 Discharge date: 02/07/2018  Time spent: 40 minutes  Recommendations for Outpatient Follow-up:  1. Follow up outpatient CBC/CMP (attention to LFT's at discharge)  2. Follow up CT chest/abdomen/pelvis as outpatient  3. Follow up repeat right upper quadrant ultrasound in about 6 months 4. Ensure pt follows up for PFT's 5. Repeat TSH as outpatient   Discharge Diagnoses:  Active Problems:   Aortic stenosis   Chest tightness   Essential hypertension   Sinus bradycardia   Dysphagia   Abdominal distention   Chest pain at rest   Abdominal bloating   Gilbert's syndrome   Severe aortic stenosis   Discharge Condition: stable  Diet recommendation: heart healthy  Filed Weights   02/05/18 0450 02/06/18 0014 02/07/18 0456  Weight: 76.2 kg 75.2 kg 75.4 kg    History of present illness:  84 y.o.malewith medical history significant ofrheumatoid arthritis, aortic stenosis (last TTE in March showed a peak velocity of 4.72ms), GERD, HTN, BPH, hepatitis A as a child who presented with dyspnea and chest pressure. Patient is [place], he understands some [place] speaks some [place] In the ED, he received morphine and aspirin with improvement in his chest pressure. He had blood work which showed a Troponin of <0.03, BNP of 380 (up from 98 in March) Cr of 1.25 (up from 1.19 in March), elevated LFTs ~ 10X ULN with an elevated tBili of 3.1.  Patient was hospitalized for further management.  Mrs. IGreenleywas admitted for chest discomfort and SOB.  His troponins were negative.  He had an echo which was notable for severe AS.  Cardiology was c/s and are now planning for TAVR.  Right and L heart cath notable for non obstructive CAD, severe AS, and mean wedge pressure of 12 mmHg.  He had carotid dopplers with 1-39% stenosis in L carotid.  CT chest  abdomen/pelvis was performed prior to d/c.  PFT's pending at time of discharge.  His LFT's were elevated on presentation, but have gradually improved.  See below for additional details  Hospital Course:  Chest tightness with dyspnea and lightheadedness Likely 2/2 aortic stenosis Echo with severe AS as noted below Cardiology working up pt for TAVR. Low dose lasix continued on discharge Continue to follow up for TAVR work up as an outpatient (PFT's still pending)  Abnormal LFTs Patient noted to have elevated AST ALT and bilirubin at presentation AST, ALT, and bilirubin improving Alk phos rising  Negative viral hepatitis panel Consider congestive hepatopathy  [place] abdomen with soft tissue density noted within gallbladder, thought c/w sludge ball (discussed with rads by prior physician who did not feel MRCP indicated and recommended repeat UKoreawithin about 6 months).  Discussed briefly with general surgery by previous physician, no role for surgical intervention.  Patient is also asymptomatic and examination is benign.   LFT's continuing to improve, should be ok to resume statin and methotrexate later this week (discussed with pt/daughter)  Essential hypertension Resume home BP meds (start lasix, resume arb)  Sinus bradycardia TSH noted to be mildly elevated 4 6. Recommend this be rechecked in the outpatient setting.  Mild acute renal failure Baseline renal function appears to be normal based on previous values.  Improved on day of discharge.  Follow.  History of dysphagia Eat small bites and slowly.  Hyperglycemia Levels noted to be mildly elevated.  HbA1c 5.1.  Procedures:  Prox Cx lesion is 20% stenosed.  Ost 1st Mrg lesion is 20% stenosed.  Prox LAD lesion is 20% stenosed.   1. Mild non-obstructive CAD 2. Severe aortic stenosis (mean gradient 54.9 mmHg, peak to peak gradient 53 mmHg, AVA 0.60 cm2) 3. Mean wedge pressure 12 mmHg  Recommendation: Will continue  workup for TAVR. We will try to get his scans in the hospital but he can be discharged home after his testing and come back for TAVR as an outpatient.   Final Interpretation: Right Carotid: There is no evidence of stenosis in the right ICA.  Left Carotid: Velocities in the left ICA are consistent with a 1-39% stenosis.  Vertebrals: Bilateral vertebral arteries demonstrate antegrade flow.  Study Conclusions  - Left ventricle: The cavity size was normal. Wall thickness was   increased in a pattern of moderate LVH. Systolic function was   vigorous. The estimated ejection fraction was in the range of 65%   to 70%. Wall motion was normal; there were no regional wall   motion abnormalities. Doppler parameters are consistent with   abnormal left ventricular relaxation (grade 1 diastolic   dysfunction). The E/e&' ratio is >15, suggesting elevated LV   filling pressure. - Aortic valve: Severe aortic stenosis. Trivial AI. Mean gradient   (S): 43 mm Hg. Peak gradient (S): 81 mm Hg. Valve area (VTI):   1.01 cm^2. Valve area (Vmax): 0.88 cm^2. Valve area (Vmean): 0.91   cm^2. - Mitral valve: Calcified annulus. Mildly thickened leaflets .   There was mild regurgitation. - Left atrium: The atrium was normal in size. - Tricuspid valve: There was mild regurgitation. - Pulmonary arteries: PA peak pressure: 46 mm Hg (S). - Inferior vena cava: The vessel was dilated. The respirophasic   diameter changes were blunted (< 50%), consistent with elevated   central venous pressure.  Impressions:  - LVEF 65-70%, moderate LVH, normal wall motion, grade 1 DD,   elevated LV filling pressure, severe aortic stenosis - mean   gradient of 43 mmHg and calculated AVA of 0.9 cm2.   Consultations:  cardiology  Discharge Exam: Vitals:   02/07/18 1017 02/07/18 1139  BP:  (!) 135/58  Pulse: 64 (!) 53  Resp:  20  Temp:  98.3 F (36.8 C)  SpO2:  96%   Denies any complaints. Daughter at bedside,  declined interpreter.    General: No acute distress. Cardiovascular: Heart sounds show a regular rate, and rhythm. Lungs: Clear to auscultation bilaterally with good air movement.  Abdomen: Soft, nontender, nondistended  Neurological: Alert and oriented 3. Moves all extremities 4. Cranial nerves II through XII grossly intact. Skin: Warm and dry. No rashes or lesions. Extremities: No clubbing or cyanosis. No edema. Pedal pulses 2+. Psychiatric: Mood and affect are normal. Insight and judgment are appropriate.  Discharge Instructions   Discharge Instructions    Call MD for:  difficulty breathing, headache or visual disturbances   Complete by:  As directed    Call MD for:  extreme fatigue   Complete by:  As directed    Call MD for:  persistant dizziness or light-headedness   Complete by:  As directed    Call MD for:  persistant nausea and vomiting   Complete by:  As directed    Call MD for:  redness, tenderness, or signs of infection (pain, swelling, redness, odor or green/yellow discharge around incision site)   Complete by:  As directed    Call MD for:  severe uncontrolled pain  Complete by:  As directed    Call MD for:  temperature >100.4   Complete by:  As directed    Diet - low sodium heart healthy   Complete by:  As directed    Discharge instructions   Complete by:  As directed    You were found to have severe aortic stenosis.    Cardiology is planning for an aortic valve replacement.    You had part of your work up for this here.  You should have pulmonary function tests set up as an outpatient.  Follow up the final results of your imaging here with cardiology.  Please follow up with your PCP.  Please follow up with CT surgery as scheduled.    You can resume your methotrexate on Sunday.  Please follow up your liver enzymes as an outpatient to make sure these continue to improve and you can continue taking your medications.  You should have a repeat ultrasound within 6  months.   Return if you have new, recurrent, or worsening symptoms.  Please ask your PCP to request records from this hospitalization so they know what was done and what the next steps will be.   Increase activity slowly   Complete by:  As directed      Allergies as of 02/07/2018      Reactions   Ativan [lorazepam] Other (See Comments)   hallucinations   Phenergan [promethazine Hcl] Hypertension   Citrus Rash   Small rash after eating for several days in a row   Other Rash   Reaction to Honey      Medication List    STOP taking these medications   hydrochlorothiazide 12.5 MG capsule Commonly known as:  MICROZIDE   valsartan 80 MG tablet Commonly known as:  DIOVAN     TAKE these medications   cyclobenzaprine 5 MG tablet Commonly known as:  FLEXERIL Take 1 tablet (5 mg total) by mouth 3 (three) times daily.   docusate sodium 100 MG capsule Commonly known as:  COLACE Take 1 capsule (100 mg total) by mouth 2 (two) times daily.   dutasteride 0.5 MG capsule Commonly known as:  AVODART Take 0.5 mg by mouth daily.   escitalopram 20 MG tablet Commonly known as:  LEXAPRO Take 1 tablet (20 mg total) by mouth daily. What changed:  when to take this   Fish Oil 1000 MG Caps Take 1,000 mg by mouth daily.   folic acid 1 MG tablet Commonly known as:  FOLVITE Take 1 mg by mouth daily after lunch.   furosemide 20 MG tablet Commonly known as:  LASIX Take 1 tablet (20 mg total) by mouth daily.   ibuprofen 800 MG tablet Commonly known as:  ADVIL,MOTRIN Take 800 mg by mouth daily.   linaclotide 145 MCG Caps capsule Commonly known as:  LINZESS Take 1 capsule (145 mcg total) by mouth daily.   losartan 25 MG tablet Commonly known as:  COZAAR Take 25 mg by mouth daily.   Melatonin 10 MG Tabs Take 10 mg by mouth at bedtime as needed (sleep).   methotrexate 2.5 MG tablet Commonly known as:  RHEUMATREX Take 22.5 mg by mouth every Sunday.   omeprazole 20 MG  capsule Commonly known as:  PRILOSEC Take 1 capsule (20 mg total) by mouth daily.   polyethylene glycol packet Commonly known as:  MIRALAX / GLYCOLAX Take 17 g by mouth daily.   simvastatin 20 MG tablet Commonly known as:  ZOCOR TAKE 1  TABLET BY MOUTH EVERY NIGHT AT BEDTIME   traMADol 50 MG tablet Commonly known as:  ULTRAM Take 1 tablet (50 mg total) by mouth every 6 (six) hours.   vitamin C 1000 MG tablet Take 1,000 mg by mouth daily after lunch.   vitamin E 400 UNIT capsule Take 400 Units by mouth daily after lunch.            Durable Medical Equipment  (From admission, onward)         Start     Ordered   02/07/18 1300  For home use only DME Walker rolling  Once    Question:  Patient needs a walker to treat with the following condition  Answer:  Severe aortic stenosis   02/07/18 1301         Allergies  Allergen Reactions  . Ativan [Lorazepam] Other (See Comments)    hallucinations  . Phenergan [Promethazine Hcl] Hypertension  . Citrus Rash    Small rash after eating for several days in a row  . Other Rash    Reaction to Honey   Follow-up Information    Rexene Alberts, MD. Go on 02/22/2018.   Specialty:  Cardiothoracic Surgery Why:  @ 4pm. Please call the office on the day of your appointment to make sure the surgeon will be there and on time as many times there are delays or rescheduling due to emergency surgery.  Contact information: [address] [place] [address] [phone]        [facility] Follow up.   Specialty:  Home Health Services Why:  They will do your home health care at your home Contact information: [place] [address] [phone]        Lajean Manes, MD.   Specialty:  Internal Medicine Why:  Please call for appointment Contact information: [address] [place] [address] [phone]            The results of significant diagnostics from  this hospitalization (including imaging, microbiology, ancillary and laboratory) are listed below for reference.    Significant Diagnostic Studies: Abd 1 View (kub)  Result Date: 02/04/2018 CLINICAL DATA:  Abdominal distension. EXAM: ABDOMEN - 1 VIEW COMPARISON:  None. FINDINGS: The bowel gas pattern is normal. No radio-opaque calculi or other significant radiographic abnormality are seen. IMPRESSION: Negative. Electronically Signed   By: Dorise Bullion III M.D   On: 02/04/2018 16:59   US Abdomen Complete  Result Date: 02/06/2018 CLINICAL DATA:  Abdominal pain. EXAM: ABDOMEN ULTRASOUND COMPLETE COMPARISON:  KUB 02/04/2018.  CT 03/23/2017.  CT 08/13/2016. FINDINGS: Gallbladder: 2.5 scratched it 3.2 cm maximum diameter large nonshadowing echogenic focus is noted within the gallbladder. Although a gallbladder mass cannot be excluded. This most likely a sludge ball. 2.1 mm Common bile duct: Diameter: 5.7 mm. Liver: No focal lesion identified. Within normal limits in parenchymal echogenicity. Portal vein is patent on color Doppler imaging with normal direction of blood flow towards the liver. IVC: No abnormality visualized. Pancreas: Visualized portion unremarkable. Spleen: Size and appearance within normal limits. Right Kidney: Length: 12.0 cm. Echogenicity within normal limits. No hydronephrosis visualized. 3.9 and 2.1 cm simple cysts. Left Kidney: Length: 12.7 cm. Echogenicity within normal limits. No hydronephrosis visualized. 2.2 cm simple cyst. Abdominal aorta: No aneurysm visualized. Other findings: None. IMPRESSION: 1. Prominent soft tissue density noted within the gallbladder most consistent with a sludge ball. No gallstones or biliary distention. No  acute abnormality identified. 2.  Simple cysts both kidneys. Electronically Signed   By: Marcello Moores  Register   On: 02/06/2018 09:17   Ct Coronary Morph W/cta Cor Nancy Fetter W/ca W/cm &/or Wo/cm  Addendum Date: 02/07/2018   ADDENDUM REPORT: 02/07/2018 17:16  CLINICAL DATA:  Aortic stenosis EXAM: Cardiac TAVR CT TECHNIQUE: The patient was scanned on a [employer] scanner. A 120 kV retrospective scan was triggered in the descending thoracic aorta at 111 HU's. Gantry rotation speed was 270 msecs and collimation was .9 mm. No beta blockade or nitro were given. The 3D data set was reconstructed in 5% intervals of the R-R cycle. Systolic and diastolic phases were analyzed on a dedicated work station using MPR, MIP and VRT modes. The patient received 80 cc of contrast. FINDINGS: Aortic Valve: Calcified and tri leaflet with restricted motion Aorta: Non aneurysmal with bovine arch and moderate calcific atherosclerotic debris Sinotubular Junction: 24 mm Ascending Thoracic Aorta: 31 mm Aortic Arch: 27 mm Descending Thoracic Aorta: 23 mm Sinus of Valsalva Measurements: Non-coronary: 28 mm Right - coronary: 29 mm Left - coronary: 30 mm Coronary Artery Height above Annulus: Left Main: Shallow at 9.1 mm above annulus Right Coronary: 11.6 mm above annulus Virtual Basal Annulus Measurements: Maximum/Minimum Diameter: 19.1 mm x 25 mm Perimeter: 70.7 mm Area: 362 mm2 Coronary Arteries: LM a bit shallow for deployment at 9.1 mm RCA is ok Optimum Fluoroscopic Angle for Delivery: LAO 14 degrees Caudal 17 degrees IMPRESSION: 1. Calcified tri leaflet AV with annular area of 362 suitable for a 23 mm Sapien 3 valve. Extensive calcium noted at the base of all 3 leaflets just above the annulus 2. Optimum angiographic angle for deployment LAO 14 Caudal 17 degrees 3.  No LAA thrombus 4.  Normal aortic root 3.1 cm with bovine arch 5. Shallow LM coronary only 9.1 mm above annulus and RCA is ok at 11.6 mm above annulus Jenkins Rouge Electronically Signed   By: Jenkins Rouge M.D.   On: 02/07/2018 17:16   Result Date: 02/07/2018 EXAM: OVER-READ INTERPRETATION  CT CHEST The following report is an over-read performed by radiologist Dr. Vinnie Langton of [facility], [place] on 02/07/2018. This  over-read does not include interpretation of cardiac or coronary anatomy or pathology. The coronary calcium score/coronary CTA interpretation by the cardiologist is attached. COMPARISON:  Chest CT 08/24/2016. FINDINGS: Extracardiac findings will be described separately under dictation for contemporaneously obtained CTA chest, abdomen and pelvis. IMPRESSION: Please see separate dictation for contemporaneously obtained CTA chest, abdomen and pelvis dated 01/30/2018 for full description of relevant extracardiac findings. Electronically Signed: By: Vinnie Langton M.D. On: 02/07/2018 14:07   Dg Chest Portable 1 View  Result Date: 02/04/2018 CLINICAL DATA:  Chest pain EXAM: PORTABLE CHEST 1 VIEW COMPARISON:  03/15/2017 chest radiograph. FINDINGS: Stable cardiomediastinal silhouette with normal heart size. No pneumothorax. No pleural effusion. Left upper lobe 6 mm pulmonary nodule is stable since 08/24/2016 chest CT and considered benign. No pulmonary edema. No acute consolidative airspace disease. IMPRESSION: No active cardiopulmonary disease. Electronically Signed   By: Ilona Sorrel M.D.   On: 02/04/2018 12:17   Ct Angio Chest Aorta W/cm &/or Wo/cm  Result Date: 02/07/2018 CLINICAL DATA:  84 year old male with history of severe aortic stenosis. Preprocedural study prior to potential transcatheter aortic valve replacement (TAVR) procedure. EXAM: CT ANGIOGRAPHY CHEST, ABDOMEN AND PELVIS TECHNIQUE: Multidetector CT imaging through the chest, abdomen and pelvis was performed using the standard protocol during bolus administration of intravenous contrast. Multiplanar reconstructed  images and MIPs were obtained and reviewed to evaluate the vascular anatomy. CONTRAST:  111m ISOVUE-370 IOPAMIDOL (ISOVUE-370) INJECTION 76% COMPARISON:  Chest CT 08/24/2016. CT the abdomen and pelvis 03/23/2017. FINDINGS: CTA CHEST FINDINGS Cardiovascular: Heart size is normal. There is no significant pericardial fluid, thickening or  pericardial calcification. There is aortic atherosclerosis, as well as atherosclerosis of the great vessels of the mediastinum and the coronary arteries, including calcified atherosclerotic plaque in the left main, left anterior descending, left circumflex and right coronary arteries. Severe thickening calcification of the aortic valve. Mediastinum/Lymph Nodes: No pathologically enlarged mediastinal or hilar lymph nodes. Several densely calcified right hilar lymph nodes are incidentally noted. Esophagus is unremarkable in appearance. No axillary lymphadenopathy. Lungs/Pleura: Smoothly marginated 5 mm right lower lobe pulmonary nodule (axial image 52 of series 5), and 5 mm left upper lobe nodule (axial image 27 of series 5), both unchanged in retrospect compared to remote prior study from 08/24/2016, considered definitively benign. No other larger more suspicious appearing pulmonary nodules or masses are noted. No acute consolidative airspace disease. No pleural effusions. Musculoskeletal/Soft Tissues: Multiple old healed rib fractures. There are no aggressive appearing lytic or blastic lesions noted in the visualized portions of the skeleton. CTA ABDOMEN AND PELVIS FINDINGS Hepatobiliary: Calcified granulomas in the liver. No suspicious cystic or solid hepatic lesions. No intra or extrahepatic biliary ductal dilatation. Gallbladder is normal in appearance. Pancreas: No pancreatic mass. No pancreatic ductal dilatation. No pancreatic or peripancreatic fluid or inflammatory changes. Spleen: Unremarkable. Adrenals/Urinary Tract: Multiple low-attenuation lesions in the kidneys bilaterally, largest of which are compatible with simple cysts, measuring up to 3.8 cm in the anterior aspect of the lower pole of the right kidney. Smaller subcentimeter low-attenuation lesions in the kidneys are too small to definitively characterize, but are also favored to represent tiny cysts. Bilateral adrenal glands are normal in appearance.  No hydroureteronephrosis. Urinary bladder is normal in appearance. Stomach/Bowel: Normal appearance of the stomach. No pathologic dilatation of small bowel or colon. Numerous colonic diverticulae are noted, particularly in the sigmoid colon, without surrounding inflammatory changes to suggest an acute diverticulitis at this time. Normal appendix. Short segment of ileum extends into a large right inguinal hernia. Vascular/Lymphatic: Aortic atherosclerosis with vascular findings and measurements pertinent to potential TAVR procedure, as detailed below. No aneurysm or dissection noted in the abdominal or pelvic vasculature. Circumaortic left renal vein (normal anatomical variant) incidentally noted. Celiac axis, superior mesenteric artery and inferior mesenteric artery are all widely patent without hemodynamically significant stenosis, as are single renal arteries bilaterally. No lymphadenopathy noted in the abdomen or pelvis. Reproductive: Prostate gland and seminal vesicles are unremarkable in appearance. Other: No significant volume of ascites.  No pneumoperitoneum. Musculoskeletal: There are no aggressive appearing lytic or blastic lesions noted in the visualized portions of the skeleton. VASCULAR MEASUREMENTS PERTINENT TO TAVR: AORTA: Minimal Aortic Diameter-15 x 15 mm Severity of Aortic Calcification-moderate RIGHT PELVIS: Right Common Iliac Artery - Minimal Diameter-9.6 x 8.6 mm Tortuosity-mild Calcification - mild Right External Iliac Artery - Minimal Diameter-7.7 x 6.3 mm Tortuosity-mild-to-moderate Calcification - mild Right Common Femoral Artery - Minimal Diameter-9.0 x 7.6 mm Tortuosity-mild Calcification - mild LEFT PELVIS: Left Common Iliac Artery - Minimal Diameter-10.3 x 9.7 mm Tortuosity-mild-to-moderate Calcification - mild Left External Iliac Artery - Minimal Diameter-8.1 x 6.3 mm Tortuosity-mild-to-moderate Calcification - mild Left Common Femoral Artery - Minimal Diameter-8.1 x 7.6 mm  Tortuosity-mild Calcification - mild Review of the MIP images confirms the above findings. IMPRESSION: 1. Vascular findings and  measurements pertinent to potential TAVR procedure, as detailed above. 2. Severe thickening calcification of the aortic valve, compatible with the reported clinical history of severe aortic stenosis. 3. Severe colonic diverticulosis without evidence to suggest an acute diverticulitis at this time. 4. Aortic atherosclerosis, in addition to left main and 3 vessel coronary artery disease. 5. Additional incidental findings, as above. Aortic Atherosclerosis (ICD10-I70.0). Electronically Signed   By: Vinnie Langton M.D.   On: 02/07/2018 15:53   Ct Angio Abd/pel W/ And/or W/o  Result Date: 02/07/2018 CLINICAL DATA:  84 year old male with history of severe aortic stenosis. Preprocedural study prior to potential transcatheter aortic valve replacement (TAVR) procedure. EXAM: CT ANGIOGRAPHY CHEST, ABDOMEN AND PELVIS TECHNIQUE: Multidetector CT imaging through the chest, abdomen and pelvis was performed using the standard protocol during bolus administration of intravenous contrast. Multiplanar reconstructed images and MIPs were obtained and reviewed to evaluate the vascular anatomy. CONTRAST:  13m ISOVUE-370 IOPAMIDOL (ISOVUE-370) INJECTION 76% COMPARISON:  Chest CT 08/24/2016. CT the abdomen and pelvis 03/23/2017. FINDINGS: CTA CHEST FINDINGS Cardiovascular: Heart size is normal. There is no significant pericardial fluid, thickening or pericardial calcification. There is aortic atherosclerosis, as well as atherosclerosis of the great vessels of the mediastinum and the coronary arteries, including calcified atherosclerotic plaque in the left main, left anterior descending, left circumflex and right coronary arteries. Severe thickening calcification of the aortic valve. Mediastinum/Lymph Nodes: No pathologically enlarged mediastinal or hilar lymph nodes. Several densely calcified right hilar  lymph nodes are incidentally noted. Esophagus is unremarkable in appearance. No axillary lymphadenopathy. Lungs/Pleura: Smoothly marginated 5 mm right lower lobe pulmonary nodule (axial image 52 of series 5), and 5 mm left upper lobe nodule (axial image 27 of series 5), both unchanged in retrospect compared to remote prior study from 08/24/2016, considered definitively benign. No other larger more suspicious appearing pulmonary nodules or masses are noted. No acute consolidative airspace disease. No pleural effusions. Musculoskeletal/Soft Tissues: Multiple old healed rib fractures. There are no aggressive appearing lytic or blastic lesions noted in the visualized portions of the skeleton. CTA ABDOMEN AND PELVIS FINDINGS Hepatobiliary: Calcified granulomas in the liver. No suspicious cystic or solid hepatic lesions. No intra or extrahepatic biliary ductal dilatation. Gallbladder is normal in appearance. Pancreas: No pancreatic mass. No pancreatic ductal dilatation. No pancreatic or peripancreatic fluid or inflammatory changes. Spleen: Unremarkable. Adrenals/Urinary Tract: Multiple low-attenuation lesions in the kidneys bilaterally, largest of which are compatible with simple cysts, measuring up to 3.8 cm in the anterior aspect of the lower pole of the right kidney. Smaller subcentimeter low-attenuation lesions in the kidneys are too small to definitively characterize, but are also favored to represent tiny cysts. Bilateral adrenal glands are normal in appearance. No hydroureteronephrosis. Urinary bladder is normal in appearance. Stomach/Bowel: Normal appearance of the stomach. No pathologic dilatation of small bowel or colon. Numerous colonic diverticulae are noted, particularly in the sigmoid colon, without surrounding inflammatory changes to suggest an acute diverticulitis at this time. Normal appendix. Short segment of ileum extends into a large right inguinal hernia. Vascular/Lymphatic: Aortic atherosclerosis  with vascular findings and measurements pertinent to potential TAVR procedure, as detailed below. No aneurysm or dissection noted in the abdominal or pelvic vasculature. Circumaortic left renal vein (normal anatomical variant) incidentally noted. Celiac axis, superior mesenteric artery and inferior mesenteric artery are all widely patent without hemodynamically significant stenosis, as are single renal arteries bilaterally. No lymphadenopathy noted in the abdomen or pelvis. Reproductive: Prostate gland and seminal vesicles are unremarkable in appearance. Other: No significant  volume of ascites.  No pneumoperitoneum. Musculoskeletal: There are no aggressive appearing lytic or blastic lesions noted in the visualized portions of the skeleton. VASCULAR MEASUREMENTS PERTINENT TO TAVR: AORTA: Minimal Aortic Diameter-15 x 15 mm Severity of Aortic Calcification-moderate RIGHT PELVIS: Right Common Iliac Artery - Minimal Diameter-9.6 x 8.6 mm Tortuosity-mild Calcification - mild Right External Iliac Artery - Minimal Diameter-7.7 x 6.3 mm Tortuosity-mild-to-moderate Calcification - mild Right Common Femoral Artery - Minimal Diameter-9.0 x 7.6 mm Tortuosity-mild Calcification - mild LEFT PELVIS: Left Common Iliac Artery - Minimal Diameter-10.3 x 9.7 mm Tortuosity-mild-to-moderate Calcification - mild Left External Iliac Artery - Minimal Diameter-8.1 x 6.3 mm Tortuosity-mild-to-moderate Calcification - mild Left Common Femoral Artery - Minimal Diameter-8.1 x 7.6 mm Tortuosity-mild Calcification - mild Review of the MIP images confirms the above findings. IMPRESSION: 1. Vascular findings and measurements pertinent to potential TAVR procedure, as detailed above. 2. Severe thickening calcification of the aortic valve, compatible with the reported clinical history of severe aortic stenosis. 3. Severe colonic diverticulosis without evidence to suggest an acute diverticulitis at this time. 4. Aortic atherosclerosis, in addition to  left main and 3 vessel coronary artery disease. 5. Additional incidental findings, as above. Aortic Atherosclerosis (ICD10-I70.0). Electronically Signed   By: Vinnie Langton M.D.   On: 02/07/2018 15:53    Microbiology: No results found for this or any previous visit (from the past 240 hour(s)).   Labs: Basic Metabolic Panel: Recent Labs  Lab 02/04/18 1157 02/04/18 1545 02/05/18 0548 02/06/18 0400 02/07/18 0448  NA 137  --  135 137 137  K 4.1  --  3.9 3.7 3.4*  CL 102  --  104 101 103  CO2 24  --  '24 25 23  ' GLUCOSE 170*  --  111* 109* 91  BUN 16  --  20 24* 19  CREATININE 1.25* 1.36* 1.36* 1.33* 1.08  CALCIUM 8.5*  --  8.0* 8.4* 8.3*  MG 1.8  --   --   --  2.0   Liver Function Tests: Recent Labs  Lab 02/04/18 1157 02/05/18 0548 02/05/18 1427 02/06/18 0400 02/07/18 0448  AST 472* 238*  --  133* 71*  ALT 424* 319*  --  230* 151*  ALKPHOS 100 138*  --  173* 205*  BILITOT 3.1* 5.6* 5.4* 4.2* 2.1*  PROT 6.3* 6.7  --  6.7 6.1*  ALBUMIN 3.5 3.4*  --  3.3* 2.9*   No results for input(s): LIPASE, AMYLASE in the last 168 hours. No results for input(s): AMMONIA in the last 168 hours. CBC: Recent Labs  Lab 02/04/18 1157 02/04/18 1545 02/05/18 0446 02/06/18 0400 02/07/18 0448  WBC 9.6 8.9 8.0 6.6 5.7  NEUTROABS 8.2*  --   --   --   --   HGB 12.3* 12.7* 14.2 13.1 12.4*  HCT 38.4* 39.9 44.7 41.3 38.6*  MCV 92.3 92.1 92.0 91.8 91.5  PLT 148* 143* 104* 117* 131*   Cardiac Enzymes: Recent Labs  Lab 02/04/18 1157 02/04/18 1545 02/04/18 2201 02/05/18 0548  TROPONINI <0.03 <0.03 <0.03 <0.03   BNP: BNP (last 3 results) Recent Labs    02/04/18 1158  BNP 380.0*    ProBNP (last 3 results) No results for input(s): PROBNP in the last 8760 hours.  CBG: Recent Labs  Lab 02/06/18 0748 02/06/18 1609 02/06/18 2106 02/07/18 0755 02/07/18 1136  GLUCAP 103* 192* 88 96 162*       Signed:  Fayrene Helper MD.  [facility] 02/07/2018, 5:25 PM

## 2018-02-08 ENCOUNTER — Encounter (HOSPITAL_COMMUNITY): Payer: Medicare Other

## 2018-02-09 ENCOUNTER — Ambulatory Visit (HOSPITAL_COMMUNITY)
Admission: RE | Admit: 2018-02-09 | Discharge: 2018-02-09 | Disposition: A | Discharge status: Home / Self Care | Payer: Medicare Other | Source: Ambulatory Visit | Attending: Physician Assistant | Admitting: Physician Assistant

## 2018-02-09 DIAGNOSIS — R0609 Other forms of dyspnea: Secondary | ICD-10-CM | POA: Diagnosis not present

## 2018-02-09 DIAGNOSIS — J984 Other disorders of lung: Secondary | ICD-10-CM | POA: Diagnosis not present

## 2018-02-09 LAB — PULMONARY FUNCTION TEST
DL/VA % PRED: 108 %
DL/VA: 4.64 ml/min/mmHg/L
DLCO UNC % PRED: 62 %
DLCO UNC: 16.34 ml/min/mmHg
FEF 25-75 POST: 1.14 L/s
FEF 25-75 Pre: 0.99 L/sec
FEF2575-%Change-Post: 15 %
FEF2575-%PRED-PRE: 84 %
FEF2575-%Pred-Post: 97 %
FEV1-%Change-Post: 7 %
FEV1-%PRED-PRE: 72 %
FEV1-%Pred-Post: 77 %
FEV1-Post: 1.54 L
FEV1-Pre: 1.43 L
FEV1FVC-%Change-Post: 7 %
FEV1FVC-%PRED-PRE: 104 %
FEV6-%Change-Post: 0 %
FEV6-%PRED-POST: 72 %
FEV6-%Pred-Pre: 72 %
FEV6-Post: 1.94 L
FEV6-Pre: 1.96 L
FEV6FVC-%CHANGE-POST: 0 %
FEV6FVC-%Pred-Post: 109 %
FEV6FVC-%Pred-Pre: 109 %
FVC-%Change-Post: 0 %
FVC-%Pred-Post: 66 %
FVC-%Pred-Pre: 66 %
FVC-PRE: 1.96 L
FVC-Post: 1.95 L
POST FEV1/FVC RATIO: 79 %
PRE FEV1/FVC RATIO: 73 %
Post FEV6/FVC ratio: 99 %
Pre FEV6/FVC Ratio: 100 %
RV % pred: 86 %
RV: 2.22 L
TLC % PRED: 72 %
TLC: 4.44 L

## 2018-02-09 MED ORDER — ALBUTEROL SULFATE (2.5 MG/3ML) 0.083% IN NEBU
2.5000 mg | INHALATION_SOLUTION | Freq: Once | RESPIRATORY_TRACT | Status: AC
  Administered 2018-02-09: 2.5 mg via RESPIRATORY_TRACT

## 2018-02-10 DIAGNOSIS — R351 Nocturia: Secondary | ICD-10-CM | POA: Diagnosis not present

## 2018-02-10 DIAGNOSIS — K567 Ileus, unspecified: Secondary | ICD-10-CM | POA: Diagnosis not present

## 2018-02-10 DIAGNOSIS — I350 Nonrheumatic aortic (valve) stenosis: Secondary | ICD-10-CM | POA: Diagnosis not present

## 2018-02-10 DIAGNOSIS — M069 Rheumatoid arthritis, unspecified: Secondary | ICD-10-CM | POA: Diagnosis not present

## 2018-02-10 DIAGNOSIS — F329 Major depressive disorder, single episode, unspecified: Secondary | ICD-10-CM | POA: Diagnosis not present

## 2018-02-10 DIAGNOSIS — I2510 Atherosclerotic heart disease of native coronary artery without angina pectoris: Secondary | ICD-10-CM | POA: Diagnosis not present

## 2018-02-10 DIAGNOSIS — D440 Neoplasm of uncertain behavior of thyroid gland: Secondary | ICD-10-CM | POA: Diagnosis not present

## 2018-02-10 DIAGNOSIS — I5031 Acute diastolic (congestive) heart failure: Secondary | ICD-10-CM | POA: Diagnosis not present

## 2018-02-10 DIAGNOSIS — B159 Hepatitis A without hepatic coma: Secondary | ICD-10-CM | POA: Diagnosis not present

## 2018-02-10 DIAGNOSIS — H9190 Unspecified hearing loss, unspecified ear: Secondary | ICD-10-CM | POA: Diagnosis not present

## 2018-02-10 DIAGNOSIS — Z9181 History of falling: Secondary | ICD-10-CM | POA: Diagnosis not present

## 2018-02-10 DIAGNOSIS — N401 Enlarged prostate with lower urinary tract symptoms: Secondary | ICD-10-CM | POA: Diagnosis not present

## 2018-02-10 DIAGNOSIS — R1310 Dysphagia, unspecified: Secondary | ICD-10-CM | POA: Diagnosis not present

## 2018-02-10 DIAGNOSIS — I110 Hypertensive heart disease with heart failure: Secondary | ICD-10-CM | POA: Diagnosis not present

## 2018-02-13 ENCOUNTER — Other Ambulatory Visit: Payer: Self-pay

## 2018-02-13 NOTE — Patient Outreach (Signed)
[place] [facility]) Care Management  02/13/2018  Shan Valdes [birth year] [id number]  EMMI: stroke red alert Referral date: 02/13/18 Referral reason: weighed themselves today: no Insurance: Medicare Day # 3  Telephone call to patient. Using pacific interpreter [phone]. Unable to reach. HIPAA compliant voice message left by pacific interpreter [phone] with call back phone number  PLAN: RNCm will attempt 2nd telephone call to patient within 4 business days.  RNCM will send outreach letter to attempt contact.   [name] RN,BSN,CCM [facility] Telephonic  [phone]

## 2018-02-14 ENCOUNTER — Other Ambulatory Visit: Payer: Self-pay

## 2018-02-14 NOTE — Patient Outreach (Signed)
[name]) Care Management  02/14/2018  [name] 1928/05/22 [id number]  EMMI: stroke red alert Referral date: 02/13/18 Referral reason: weighed themselves today: no Insurance: Medicare Day # 3 Attempt #2  Telephone call to patient using pacific interpreter [phone].  Contact answering phone states she is patients daughter, [name].  Daughter is listed as patients designated party release.   Daughter speaks [place].  RNCM informed pacific interpreter [phone] her services would not be needed at this time.  Daughter states she is not able to take call at this time due to arriving at an event.  Daughter requested call back on tomorrow.   PLAN: RNCM will attempt 3rd  telephone call to patient/and or daughter within 4 business days  [name] RN,BSN,CCM [facility] Telephonic  [phone]

## 2018-02-15 ENCOUNTER — Other Ambulatory Visit: Payer: Self-pay

## 2018-02-15 NOTE — Patient Outreach (Signed)
[name]) Care Management  02/15/2018  [name] April 30, 1928 [id number]   EMMI:stroke red alert Referral date:02/13/18 Referral reason:weighed themselves today: no Insurance: Medicare  Telephone call to patients daughter and designated party release, [name].  HIPAA verified with daughter for patient. Explained reason for call.  Daughter states patient is not weighing every day. She states he is weighing often but not daily.   RNCM discussed importance of patient weighing daily and recording weights.  Discussed heart failure signs/ symptoms. Advised to call physician for heart failure symptoms. Advised  To call 911 for more severe symptoms.  RNCM inquired if patients home health had started. Daughter states the home health agency saw patient on Saturday 02/11/18.  She states they have not heard back from the home care agency about further services.  Daughter states home health agency is [facility].   RNCM offered to call home health agency for start of care date. Daughter voiced consent and appreciation.  Daughter states patient has his medications that she puts in his pill box.  Daughter states, "as far as I know he is taking the medicine."   Daughter states patient has a follow up with his primary MD on 02/16/18 and an appointment with the thoracic surgeon on 02/22/18  Daughter states patients heart valve is calcified and the doctor wants to do an aortic valve replacement.  Daughter states she is unsure when this will be scheduled until they meet with doctor.   Daughter confirms patient has transportation to his appointments.  RNCM advised patient to notify MD of any changes in condition prior to scheduled appointment. RNCM verified patient aware of 911 services for urgent/ emergent needs.  PLAN: RNCM will follow up with patients daughter within 4 business days.   [name] RN,BSN,CCM [facility] Telephonic  [phone]

## 2018-02-16 DIAGNOSIS — Z23 Encounter for immunization: Secondary | ICD-10-CM | POA: Diagnosis not present

## 2018-02-16 DIAGNOSIS — Z79899 Other long term (current) drug therapy: Secondary | ICD-10-CM | POA: Diagnosis not present

## 2018-02-16 DIAGNOSIS — I10 Essential (primary) hypertension: Secondary | ICD-10-CM | POA: Diagnosis not present

## 2018-02-16 DIAGNOSIS — E7800 Pure hypercholesterolemia, unspecified: Secondary | ICD-10-CM | POA: Diagnosis not present

## 2018-02-16 DIAGNOSIS — Q253 Supravalvular aortic stenosis: Secondary | ICD-10-CM | POA: Diagnosis not present

## 2018-02-19 ENCOUNTER — Ambulatory Visit: Payer: Self-pay

## 2018-02-19 ENCOUNTER — Other Ambulatory Visit: Payer: Self-pay

## 2018-02-19 NOTE — Patient Outreach (Signed)
[place] [facility]) Care Management  02/19/2018  [name] 09-02-1927 [id number]  EMMI:stroke red alert Referral date:02/13/18 / 02/19/18 Referral reason:weighed themselves today: no Insurance: Medicare  Telephone call to [facility]. Spoke with [name], Marketing executive.  Confirmed that patient has been contacted by home health nurse.  [name] states nurse attempted to see patient on 02/16/18 but was unable to see due to patient having a scheduled appointment.   [name] states patient is scheduled to be seen on tomorrow 02/20/18.    RNCM contacted patients daughter and designate party release, Treyven Lafauci.  HIPAA verified for patient. Daughter states there is a problem with [facility] contacting them. She states she has spoken with the scheduler at [facility] but states the nurse that is assigned to see patient contacted patients other daughter.  Daughter, [name] states she is the one to be contacted to schedule all of patients appointments.  RNCM gave daughter contact phone number for [facility]. Advised her to contact [facility] to confirm the right contact persons and numbers are on file for patient. Daughter states patient is scheduled to have home physical therapy on tomorrow. Daughter states patient is compliant with weighing himself daily and recording. She states she is helping patient to comply with a low salt diet. RNCM discussed and offered ongoing follow up with [facility] care management. Daughter declined. Daughter verbally agreed to receive EMMI education material regarding congestive heart failure and low salt diet.  RNCM advised patient to notify MD of any changes in condition prior to scheduled appointment. RNCM provided contact name and number: [phone] or main office number [phone] and 24 hour nurse advise line [phone].  RNCM verified patient aware of 911 services for urgent/ emergent needs. RNCM advised daughter to call [facility] care management  if services are needed.   PLAN; RNCM will close patient due to refusal of services  RNCM will send closure notification to patients physician.  RNCM send [facility] brochure / magnet to patient on 02/15/18  [name] RN,BSN,CCM [facility] Telephonic  [phone]

## 2018-02-20 ENCOUNTER — Other Ambulatory Visit: Payer: Self-pay

## 2018-02-20 DIAGNOSIS — I350 Nonrheumatic aortic (valve) stenosis: Secondary | ICD-10-CM | POA: Diagnosis not present

## 2018-02-20 DIAGNOSIS — I5031 Acute diastolic (congestive) heart failure: Secondary | ICD-10-CM | POA: Diagnosis not present

## 2018-02-20 DIAGNOSIS — I2510 Atherosclerotic heart disease of native coronary artery without angina pectoris: Secondary | ICD-10-CM | POA: Diagnosis not present

## 2018-02-20 DIAGNOSIS — N401 Enlarged prostate with lower urinary tract symptoms: Secondary | ICD-10-CM | POA: Diagnosis not present

## 2018-02-20 DIAGNOSIS — I110 Hypertensive heart disease with heart failure: Secondary | ICD-10-CM | POA: Diagnosis not present

## 2018-02-20 DIAGNOSIS — M069 Rheumatoid arthritis, unspecified: Secondary | ICD-10-CM | POA: Diagnosis not present

## 2018-02-20 NOTE — Patient Outreach (Addendum)
[place] [facility]) Care Management  02/20/2018  [name] 1928-02-15 [id number]  EMMI:stroke red alert Referral date:02/13/18 / 02/19/18 Referral reason:weight 160 lbs Insurance: Medicare  Telephone call to patient's daughter and designated party release, [name].  HIPAA verified by daughter for patient. Explained reason call.  Daughter denies patient having any reported symptoms or concerns. RNCM noted patients weight per EMMI heart failure report 160 lbs.   Daughter states she is unsure what patients weight was at discharge.  Upon chart review patients weight recorded at 167.9 lbs.    Daughter states she and patient is aware to monitor patients weight daily and record.  Aware that if patient gains 3 lbs overnight or 5 lbs in a week to report this to doctor along with any other symptoms.  Advised patient's daughter  that they would continue to get automated EMMI- HF post discharge calls to assess how patient is   Doing and will receive a call from a nurse if any of their responses were abnormal.Daughter  voiced understanding.  She states her sister receives the automated calls for her father due to language barrier.  Daughter states she will discuss with her sister whether to continue with automated EMMI calls.   PLAN: RNCM will close patient due to being assessed and having no further needs.   [name] RN,BSN,CCM [facility] Telephonic  [phone]

## 2018-02-21 ENCOUNTER — Other Ambulatory Visit: Payer: Self-pay

## 2018-02-21 NOTE — Patient Outreach (Signed)
[facility]) Care Management  02/21/2018  [name] 1927-09-12 [id number]  Telephone call received from patients duaghter/ designated party release, [name] requesting EMMI heart failure calls to be discontinued.   PLAN: RNCM notified [facility] care management assistant to discontinue calls as  Requested.   [name] RN,BSN,CCM [facility] Telephonic  [phone]

## 2018-02-22 ENCOUNTER — Institutional Professional Consult (permissible substitution) (INDEPENDENT_AMBULATORY_CARE_PROVIDER_SITE_OTHER): Payer: Medicare Other | Admitting: Thoracic Surgery (Cardiothoracic Vascular Surgery)

## 2018-02-22 ENCOUNTER — Other Ambulatory Visit: Payer: Self-pay

## 2018-02-22 ENCOUNTER — Encounter: Payer: Self-pay | Admitting: Thoracic Surgery (Cardiothoracic Vascular Surgery)

## 2018-02-22 VITALS — BP 116/57 | HR 65 | Resp 16 | Ht 67.0 in | Wt 168.0 lb

## 2018-02-22 DIAGNOSIS — I2510 Atherosclerotic heart disease of native coronary artery without angina pectoris: Secondary | ICD-10-CM | POA: Diagnosis not present

## 2018-02-22 DIAGNOSIS — I350 Nonrheumatic aortic (valve) stenosis: Secondary | ICD-10-CM

## 2018-02-22 DIAGNOSIS — N401 Enlarged prostate with lower urinary tract symptoms: Secondary | ICD-10-CM | POA: Diagnosis not present

## 2018-02-22 DIAGNOSIS — I5031 Acute diastolic (congestive) heart failure: Secondary | ICD-10-CM | POA: Diagnosis not present

## 2018-02-22 DIAGNOSIS — I110 Hypertensive heart disease with heart failure: Secondary | ICD-10-CM | POA: Diagnosis not present

## 2018-02-22 DIAGNOSIS — M069 Rheumatoid arthritis, unspecified: Secondary | ICD-10-CM | POA: Diagnosis not present

## 2018-02-22 NOTE — Progress Notes (Signed)
[facility]  MULTIDISCIPLINARY HEART VALVE CLINIC  CARDIOTHORACIC SURGERY CONSULTATION REPORT  Referring Provider is Ryan Blanks, MD Primary Cardiologist is Ryan Furbish, MD PCP is Ryan Manes, MD  Chief Complaint  Patient presents with  . Aortic Stenosis    TAVR eval..PFT 8/26, CTAS 8/28, ECHO 8/26, CATH 02/06/18, CAROTID DUPLEX 02/05/18    HPI:  Patient is an 84 year old male originally from [place] with history of aortic stenosis, hypertension, hyperlipidemia, and rheumatoid arthritis on methotrexate who has been referred for surgical consultation.  Patient originally moved to [place] in 1991.  He has been remarkably physically active and healthy all of his life.  He developed rheumatoid arthritis which primarily afflicts his hands.  He has been treated chronically with methotrexate.  He was found to have a heart murmur on exam and has been followed for several years with known history of aortic stenosis initially by Dr. Melvern Boyle and more recently by Dr. Marlou Boyle.  His last follow-up visit was in September 2016 at which time echocardiogram revealed normal left ventricular function with moderate aortic stenosis.  In 2018 the patient was hospitalized for a prolonged period of time after a mechanical fall.  Echocardiogram performed during that admission revealed aortic stenosis with mean transvalvular gradient estimated 37 mmHg.  Patient ultimately recovered uneventfully and return to his normal active lifestyle.  The patient was recently hospitalized at [facility] with symptoms of shortness of breath, abdominal bloating, and some nausea and vomiting.  Prior to presentation he took an extra blood pressure pill then developed discomfort across his chest and dizziness without syncope.  He was hospitalized and evaluated comprehensively and found to have no significant acute problems with exception of the presence of severe aortic stenosis.  Peak  velocity across aortic valve was measured 4.5 m/s corresponding to mean transvalvular gradient estimated 43 mmHg.  There was normal left ventricular systolic function.  The patient was referred to the multidisciplinary heart valve team and evaluated by Dr. Angelena Boyle.  Left and right heart catheterization was performed February 06, 2018 notable for the presence of severe aortic stenosis.  Mean transvalvular gradient was measured 54.86 mmHg corresponding to aortic valve area calculated 0.60 cm.  Right heart pressures were normal.  The patient was noted to have normal coronary artery anatomy with mild nonobstructive coronary artery disease.  CT angiography was performed and the patient discharged home.  Cardiothoracic surgical consultation was requested.  Patient is a widower who lives alone locally in [place].  He does not speak any [place].  He has 2 adult daughters who live nearby and are supportive.  He is accompanied by one daughter and a Optometrist from the hospital for his consultation today.  His second daughter listened and over the telephone and participated in the conversation and consultation.  The patient has remained active physically and takes care of himself and all chores around the house.  He exercises with a physical therapist several times a week.  The patient admits to gradual progression of symptoms of exertional shortness of breath, culminating in the symptoms which brought him to the hospital recently.  He has not had resting shortness of breath, PND, orthopnea, or lower extremity edema.  He gets some vague "discomfort" across his chest.  Past Medical History:  Diagnosis Date  . BPH (benign prostatic hypertrophy)   . Depression   . Diverticulosis 02/07/2018  . Diverticulosis of colon   . GERD (gastroesophageal reflux disease)   . Gilbert's syndrome   .  History of kidney stones   . HOH (hard of hearing)    refuses to wear his Hearing Aid  . Hypertension   . Inguinal hernia     right  . Internal hemorrhoid, bleeding   . Normal coronary arteries    a. Cath 07/2010: normal coronaries. Felt to have noncardiac CP/SOB at that time possibly r/t anxiety.  . RA (rheumatoid arthritis) ([place])    bilateral hands/ wrist--  seronegative  . Severe aortic stenosis   . Thyroid nodule    noted 11-2009  . Wears dentures     Past Surgical History:  Procedure Laterality Date  . CARDIAC CATHETERIZATION  07-19-2010  dr Tressia Miners Boyle   normal coronary arteries,  ef 60%,  moderate aortic stenosis- gradient 1mmHg, normal right heart pressure  . CARDIOVASCULAR STRESS TEST  05/ 2011   dr [name]   normal low risk perfusion study  . CATARACT EXTRACTION W/ INTRAOCULAR LENS  IMPLANT, BILATERAL  2013  . COLONOSCOPY  2010  approx  . RIGHT/LEFT HEART CATH AND CORONARY ANGIOGRAPHY N/A 02/06/2018   Procedure: RIGHT/LEFT HEART CATH AND CORONARY ANGIOGRAPHY;  Surgeon: Ryan Blanks, MD;  Location: [place] [facility];  Service: Cardiovascular;  Laterality: N/A;  . THYROID LOBECTOMY Right 05/05/2015   Procedure: RIGHT THYROID LOBECTOMY;  Surgeon: Ryan Gemma, MD;  Location: [place];  Service: General;  Laterality: Right;  . TRANSANAL HEMORRHOIDAL DEARTERIALIZATION N/A 04/09/2014   Procedure: TRANSANAL HEMORRHOIDAL DEARTERIALIZATION OF INTERNAL HEMORROIDS;  Surgeon: Ryan Ruff, MD;  Location: [facility];  Service: General;  Laterality: N/A;  . TRANSTHORACIC ECHOCARDIOGRAM  11-26-2013   moderate focal basal and mild LVH/  ef 97-02%/  grade I diastolic dysfunction/ mild LAE/  moderate calcification with stenosis AV with mild regurg,  gradients 35 abd 58 mmHg /  mild TR    Family History  Problem Relation Age of Onset  . Pulmonary embolism Mother 55       caused by complications of surgery  . CVA Father   . Diabetes Brother   . Diabetes Brother   . Breast cancer Sister     Social History   Socioeconomic History  . Marital status: Widowed    Spouse name: Not on file    . Number of children: Not on file  . Years of education: Not on file  . Highest education level: Not on file  Occupational History  . Not on file  Social Needs  . Financial resource strain: Not on file  . Food insecurity:    Worry: Not on file    Inability: Not on file  . Transportation needs:    Medical: Not on file    Non-medical: Not on file  Tobacco Use  . Smoking status: Former Smoker    Years: 40.00    Types: Cigarettes    Start date: 06/13/1973    Last attempt to quit: 04/03/1981    Years since quitting: 36.9  . Smokeless tobacco: Never Used  Substance and Sexual Activity  . Alcohol use: Yes    Comment: occasional  . Drug use: No  . Sexual activity: Not on file  Lifestyle  . Physical activity:    Days per week: Not on file    Minutes per session: Not on file  . Stress: Not on file  Relationships  . Social connections:    Talks on phone: Not on file    Gets together: Not on file    Attends religious service: Not on file  Active member of club or organization: Not on file    Attends meetings of clubs or organizations: Not on file    Relationship status: Not on file  . Intimate partner violence:    Fear of current or ex partner: Not on file    Emotionally abused: Not on file    Physically abused: Not on file    Forced sexual activity: Not on file  Other Topics Concern  . Not on file  Social History Narrative  . Not on file    Current Outpatient Medications  Medication Sig Dispense Refill  . dutasteride (AVODART) 0.5 MG capsule Take 0.5 mg by mouth daily.    Marland Kitchen escitalopram (LEXAPRO) 20 MG tablet Take 1 tablet (20 mg total) by mouth daily. (Patient taking differently: Take 20 mg by mouth at bedtime. ) 30 tablet 4  . folic acid (FOLVITE) 1 MG tablet Take 1 mg by mouth daily after lunch.   0  . furosemide (LASIX) 20 MG tablet Take 1 tablet (20 mg total) by mouth daily. 30 tablet 11  . ibuprofen (ADVIL,MOTRIN) 800 MG tablet Take 800 mg by mouth daily.  1  .  Melatonin 10 MG TABS Take 10 mg by mouth at bedtime as needed (sleep).    . methotrexate (RHEUMATREX) 2.5 MG tablet Take 22.5 mg by mouth every Sunday.     . Omega-3 Fatty Acids (FISH OIL) 1000 MG CAPS Take 1,000 mg by mouth daily.    . simvastatin (ZOCOR) 20 MG tablet TAKE 1 TABLET BY MOUTH EVERY NIGHT AT BEDTIME (Patient taking differently: Take 20 mg by mouth at bedtime. ) 30 tablet 0  . vitamin E 400 UNIT capsule Take 400 Units by mouth daily after lunch.    . Ascorbic Acid (VITAMIN C) 1000 MG tablet Take 1,000 mg by mouth daily after lunch.    . cyclobenzaprine (FLEXERIL) 5 MG tablet Take 1 tablet (5 mg total) by mouth 3 (three) times daily. (Patient not taking: Reported on 02/04/2018) 90 tablet 0  . docusate sodium (COLACE) 100 MG capsule Take 1 capsule (100 mg total) by mouth 2 (two) times daily. (Patient not taking: Reported on 02/04/2018) 10 capsule 0  . linaclotide (LINZESS) 145 MCG CAPS capsule Take 1 capsule (145 mcg total) by mouth daily. (Patient not taking: Reported on 02/04/2018) 30 capsule 1  . losartan (COZAAR) 25 MG tablet Take 25 mg by mouth daily.   3  . omeprazole (PRILOSEC) 20 MG capsule Take 1 capsule (20 mg total) by mouth daily. (Patient not taking: Reported on 02/04/2018) 30 capsule 0  . polyethylene glycol (MIRALAX / GLYCOLAX) packet Take 17 g by mouth daily. (Patient not taking: Reported on 02/04/2018) 14 each 0  . traMADol (ULTRAM) 50 MG tablet Take 1 tablet (50 mg total) by mouth every 6 (six) hours. (Patient not taking: Reported on 02/04/2018) 120 tablet 0   No current facility-administered medications for this visit.     Allergies  Allergen Reactions  . Ativan [Lorazepam] Other (See Comments)    hallucinations  . Phenergan [Promethazine Hcl] Hypertension  . Citrus Rash    Small rash after eating for several days in a row  . Other Rash    Reaction to Honey      Review of Systems:   General:  normal appetite, normal energy, no weight gain, no weight loss, no  fever  Cardiac:  + chest pain with exertion, no chest pain at rest, + SOB with exertion, no resting SOB, no  PND, no orthopnea, no palpitations, no arrhythmia, no atrial fibrillation, no LE edema, + one dizzy spell, no syncope  Respiratory:  + exertional shortness of breath, no home oxygen, no productive cough, no dry cough, no bronchitis, no wheezing, no hemoptysis, no asthma, no pain with inspiration or cough, no sleep apnea, no CPAP at night  GI:   no difficulty swallowing, no reflux, no frequent heartburn, no hiatal hernia, no abdominal pain, no constipation, no diarrhea, no hematochezia, no hematemesis, no melena  GU:   no dysuria,  + frequency, no urinary tract infection, no hematuria, ? possible enlarged prostate, no kidney stones, no kidney disease  Vascular:  no pain suggestive of claudication, no pain in feet, no leg cramps, no varicose veins, no DVT, no non-healing foot ulcer  Neuro:   no stroke, no TIA's, no seizures, no headaches, no temporary blindness one eye,  no slurred speech, no peripheral neuropathy, no chronic pain, no instability of gait, no memory/cognitive dysfunction  Musculoskeletal: + arthritis, + joint swelling, no myalgias, no difficulty walking, normal mobility   Skin:   no rash, no itching, no skin infections, no pressure sores or ulcerations  Psych:   no anxiety, + depression, no nervousness, no unusual recent stress  Eyes:   no blurry vision, no floaters, no recent vision changes, no wears glasses or contacts  ENT:   + hearing loss, no loose or painful teeth, full dentures, last saw dentist 1 year ago  Hematologic:  no easy bruising, no abnormal bleeding, no clotting disorder, no frequent epistaxis  Endocrine:  no diabetes, does not check CBG's at home           Physical Exam:   BP (!) 116/57 (BP Location: Left Arm, Patient Position: Sitting, Cuff Size: Large)   Pulse 65   Resp 16   Ht 5\' 7"  (1.702 m)   Wt 167 lb 15.9 oz (76.2 kg)   SpO2 92% Comment: on ra   BMI 26.31 kg/m   General:    well-appearing  HEENT:  Unremarkable   Neck:   no JVD, no bruits, no adenopathy   Chest:   clear to auscultation, symmetrical breath sounds, no wheezes, no rhonchi   CV:   RRR, grade III/VI crescendo/decrescendo murmur heard best at RUSB,  no diastolic murmur  Abdomen:  soft, non-tender, no masses   Extremities:  warm, well-perfused, pulses palpable, no LE edema  Rectal/GU  Deferred  Neuro:   Grossly non-focal and symmetrical throughout  Skin:   Clean and dry, no rashes, no breakdown   Diagnostic Tests:  Transthoracic Echocardiography  Patient:    [name], [name] MR #:       [id number] Study Date: 02/05/2018 Gender:     M Age:        31 Height:     170.2 cm Weight:     76.2 kg BSA:        1.91 m^2 Pt. Status: Room:       [place], [place]  SONOGRAPHER  Johny Chess, RDCS, CCT  ADMITTING    Donia Guiles B  PERFORMING   [facility], Inpatient  cc:  ------------------------------------------------------------------- LV EF: 65% -   70%  ------------------------------------------------------------------- Indications:      Aortic stenosis 424.1.  ------------------------------------------------------------------- History:   PMH:   Dyspnea.  Risk factors:  Bradycardia. Hypertension.  ------------------------------------------------------------------- Study Conclusions  - Left ventricle: The cavity size was  normal. Wall thickness was   increased in a pattern of moderate LVH. Systolic function was   vigorous. The estimated ejection fraction was in the range of 65%   to 70%. Wall motion was normal; there were no regional wall   motion abnormalities. Doppler parameters are consistent with   abnormal left ventricular relaxation (grade 1 diastolic   dysfunction). The E/e&' ratio is >15, suggesting elevated LV   filling pressure. - Aortic valve: Severe aortic stenosis. Trivial AI.  Mean gradient   (S): 43 mm Hg. Peak gradient (S): 81 mm Hg. Valve area (VTI):   1.01 cm^2. Valve area (Vmax): 0.88 cm^2. Valve area (Vmean): 0.91   cm^2. - Mitral valve: Calcified annulus. Mildly thickened leaflets .   There was mild regurgitation. - Left atrium: The atrium was normal in size. - Tricuspid valve: There was mild regurgitation. - Pulmonary arteries: PA peak pressure: 46 mm Hg (S). - Inferior vena cava: The vessel was dilated. The respirophasic   diameter changes were blunted (< 50%), consistent with elevated   central venous pressure.  Impressions:  - LVEF 65-70%, moderate LVH, normal wall motion, grade 1 DD,   elevated LV filling pressure, severe aortic stenosis - mean   gradient of 43 mmHg and calculated AVA of 0.9 cm2.  ------------------------------------------------------------------- Study data:  Comparison was made to the study of 08/21/2016.  Study status:  Routine.  Procedure:  The patient reported no pain pre or post test. Transthoracic echocardiography. Image quality was fair. Study completion:  There were no complications. Transthoracic echocardiography.  M-mode, complete 2D, spectral Doppler, and color Doppler.  Birthdate:  Patient birthdate: 01-09-1928.  Age:  Patient is 84 yr old.  Sex:  Gender: male. BMI: 26.3 kg/m^2.  Blood pressure:     136/58  Patient status: Inpatient.  Study date:  Study date: 02/05/2018. Study time: 09:04 AM.  Location:  Bedside.  -------------------------------------------------------------------  ------------------------------------------------------------------- Left ventricle:  The cavity size was normal. Wall thickness was increased in a pattern of moderate LVH. Systolic function was vigorous. The estimated ejection fraction was in the range of 65% to 70%. Wall motion was normal; there were no regional wall motion abnormalities. Doppler parameters are consistent with abnormal left ventricular relaxation (grade 1  diastolic dysfunction). The E/e&' ratio is >15, suggesting elevated LV filling pressure.  ------------------------------------------------------------------- Aortic valve:  Severe aortic stenosis. Trivial AI.  Doppler: VTI ratio of LVOT to aortic valve: 0.4. Valve area (VTI): 1.01 cm^2. Indexed valve area (VTI): 0.53 cm^2/m^2. Peak velocity ratio of LVOT to aortic valve: 0.35. Valve area (Vmax): 0.88 cm^2. Indexed valve area (Vmax): 0.46 cm^2/m^2. Mean velocity ratio of LVOT to aortic valve: 0.36. Valve area (Vmean): 0.91 cm^2. Indexed valve area (Vmean): 0.48 cm^2/m^2.    Mean gradient (S): 43 mm Hg. Peak gradient (S): 81 mm Hg.  ------------------------------------------------------------------- Aorta:  Aortic root: The aortic root was normal in size. Ascending aorta: The ascending aorta was normal in size.  ------------------------------------------------------------------- Mitral valve:   Calcified annulus. Mildly thickened leaflets . Doppler:  There was mild regurgitation.    Peak gradient (D): 6 mm Hg.  ------------------------------------------------------------------- Left atrium:  The atrium was normal in size.  ------------------------------------------------------------------- Atrial septum:  No defect or patent foramen ovale was identified.   ------------------------------------------------------------------- Right ventricle:  The cavity size was normal. Wall thickness was normal. Systolic function was normal.  ------------------------------------------------------------------- Pulmonic valve:    The valve appears to be grossly normal. Doppler:  There was trivial regurgitation.  ------------------------------------------------------------------- Tricuspid valve:  Doppler:  There was mild regurgitation.  ------------------------------------------------------------------- Pulmonary artery:   The main pulmonary artery was  normal-sized.  ------------------------------------------------------------------- Right atrium:  The atrium was normal in size.  ------------------------------------------------------------------- Pericardium:  There was no pericardial effusion.  ------------------------------------------------------------------- Systemic veins: Inferior vena cava: The vessel was dilated. The respirophasic diameter changes were blunted (< 50%), consistent with elevated central venous pressure.  ------------------------------------------------------------------- Measurements   Left ventricle                           Value          Reference  LV ID, ED, PLAX chordal                  44    mm       43 - 52  LV ID, ES, PLAX chordal          (L)     22    mm       23 - 38  LV fx shortening, PLAX chordal           50    %        >=29  LV PW thickness, ED                      10    mm       ----------  IVS/LV PW ratio, ED                      0.9            <=1.3  Stroke volume, 2D                        107   ml       ----------  Stroke volume/bsa, 2D                    56    ml/m^2   ----------  LV e&', lateral                           9.03  cm/s     ----------  LV E/e&', lateral                         13.4           ----------  LV e&', medial                            5.98  cm/s     ----------  LV E/e&', medial                          20.23          ----------  LV e&', average                           7.51  cm/s     ----------  LV E/e&', average                         16.12          ----------    Ventricular septum  Value          Reference  IVS thickness, ED                        9     mm       ----------    LVOT                                     Value          Reference  LVOT ID, S                               18    mm       ----------  LVOT area                                2.54  cm^2     ----------  LVOT peak velocity, S                    156   cm/s      ----------  LVOT mean velocity, S                    109   cm/s     ----------  LVOT VTI, S                              34.2  cm       ----------  LVOT peak gradient, S                    10    mm Hg    ----------  Stroke volume (SV), LVOT DP              87    ml       ----------  Stroke index (SV/bsa), LVOT DP           45.5  ml/m^2   ----------    Aortic valve                             Value          Reference  Aortic valve peak velocity, S            450   cm/s     ----------  Aortic valve mean velocity, S            304   cm/s     ----------  Aortic valve VTI, S                      85.9  cm       ----------  Aortic mean gradient, S                  43    mm Hg    ----------  Aortic peak gradient, S                  81    mm Hg    ----------  VTI ratio, LVOT/AV  0.4            ----------  Aortic valve area, VTI                   1.01  cm^2     ----------  Aortic valve area/bsa, VTI               0.53  cm^2/m^2 ----------  Velocity ratio, peak, LVOT/AV            0.35           ----------  Aortic valve area, peak velocity         0.88  cm^2     ----------  Aortic valve area/bsa, peak              0.46  cm^2/m^2 ----------  velocity  Velocity ratio, mean, LVOT/AV            0.36           ----------  Aortic valve area, mean velocity         0.91  cm^2     ----------  Aortic valve area/bsa, mean              0.48  cm^2/m^2 ----------  velocity    Aorta                                    Value          Reference  Aortic root ID, ED                       29    mm       ----------    Left atrium                              Value          Reference  LA ID, A-P, ES                           35    mm       ----------  LA ID/bsa, A-P                           1.83  cm/m^2   <=2.2  LA volume, S                             56.3  ml       ----------  LA volume/bsa, S                         29.4  ml/m^2   ----------  LA volume, ES, 1-p A4C                   48.7  ml        ----------  LA volume/bsa, ES, 1-p A4C               25.5  ml/m^2   ----------  LA volume, ES, 1-p A2C                   66.4  ml       ----------  LA volume/bsa, ES, 1-p A2C               34.7  ml/m^2   ----------    Mitral valve                             Value          Reference  Mitral E-wave peak velocity              121   cm/s     ----------  Mitral A-wave peak velocity              147   cm/s     ----------  Mitral peak gradient, D                  6     mm Hg    ----------  Mitral E/A ratio, peak                   0.8            ----------    Pulmonary arteries                       Value          Reference  PA pressure, S, DP               (H)     46    mm Hg    <=30    Tricuspid valve                          Value          Reference  Tricuspid regurg peak velocity           328   cm/s     ----------  Tricuspid peak RV-RA gradient            43    mm Hg    ----------    Right atrium                             Value          Reference  RA ID, S-I, ES, A4C              (H)     50.6  mm       34 - 49  RA area, ES, A4C                         14.8  cm^2     8.3 - 19.5  RA volume, ES, A/L                       34.6  ml       ----------  RA volume/bsa, ES, A/L                   18.1  ml/m^2   ----------    Systemic veins                           Value          Reference  Estimated CVP  3     mm Hg    ----------    Right ventricle                          Value          Reference  TAPSE                                    27.2  mm       ----------  RV pressure, S, DP               (H)     46    mm Hg    <=30  RV s&', lateral, S                        18.9  cm/s     ----------  Legend: (L)  and  (H)  mark values outside specified reference range.  ------------------------------------------------------------------- Prepared and Electronically Authenticated by  Lyman Bishop MD 2019-08-26T10:42:13    RIGHT/LEFT HEART CATH AND CORONARY  ANGIOGRAPHY  Conclusion     Prox Cx lesion is 20% stenosed.  Ost 1st Mrg lesion is 20% stenosed.  Prox LAD lesion is 20% stenosed.   1. Mild non-obstructive CAD 2. Severe aortic stenosis (mean gradient 54.9 mmHg, peak to peak gradient 53 mmHg, AVA 0.60 cm2) 3. Mean wedge pressure 12 mmHg  Recommendation: Will continue workup for TAVR. We will try to get his scans in the hospital but he can be discharged home after his testing and come back for TAVR as an outpatient.     Indications   Severe aortic stenosis [I35.0 (ICD-10-CM)]  Procedural Details/Technique   Technical Details Indication: 84 yo male with HTN, HLD, RA and severe AS  Procedure: The risks, benefits, complications, treatment options, and expected outcomes were discussed with the patient. The patient and/or family concurred with the proposed plan, giving informed consent. The patient was brought to the cath lab after IV hydration was given. The patient was not sedated. The IV catheter present in the right antecubital vein was changed for a 5 [place] sheath. Right heart cath performed with a balloon tipped catheter. The right wrist was prepped and draped in a sterile fashion. 1% lidocaine was used for local anesthesia. Using the modified Seldinger access technique, a 5 French sheath was placed in the right radial artery. 3 mg Verapamil was given through the sheath. 4000 units IV heparin was given. Standard diagnostic catheters were used to perform selective coronary angiography. I crossed the aortic valve with an AL-1 and a straight wire. The sheath was removed from the right radial artery and a Terumo hemostasis band was applied at the arteriotomy site on the right wrist.     Estimated blood loss <50 mL.  During this procedure no sedation was administered.  Complications   Complications documented before study signed (02/06/2018 12:28 PM EDT)    RIGHT/LEFT HEART CATH AND CORONARY ANGIOGRAPHY   None Documented by  Ryan Blanks, MD 02/06/2018 12:08 PM EDT  Time Range: Intraprocedure      Coronary Findings   Diagnostic  Dominance: Left  Left Anterior Descending  Vessel is large.  Prox LAD lesion 20% stenosed  Prox LAD lesion is 20% stenosed. The lesion is calcified.  Left Circumflex  Prox Cx lesion 20% stenosed  Prox Cx lesion is  20% stenosed.  First Obtuse Marginal Branch  Vessel is moderate in size.  Ost 1st Mrg lesion 20% stenosed  Ost 1st Mrg lesion is 20% stenosed.  Right Coronary Artery  Vessel is small.  Intervention   No interventions have been documented.  Coronary Diagrams   Diagnostic Diagram       Implants    No implant documentation for this case.  MERGE Images   Show images for CARDIAC CATHETERIZATION   Link to Procedure Log   Procedure Log    Hemo Data    Most Recent Value  Fick Cardiac Output 4.24 L/min  Fick Cardiac Output Index 2.26 (L/min)/BSA  Aortic Mean Gradient 54.86 mmHg  Aortic Peak Gradient 53 mmHg  Aortic Valve Area 0.60  Aortic Value Area Index 0.32 cm2/BSA  RA A Wave 12 mmHg  RA V Wave 9 mmHg  RA Mean 7 mmHg  RV Systolic Pressure 36 mmHg  RV Diastolic Pressure 0 mmHg  RV EDP 8 mmHg  PA Systolic Pressure 35 mmHg  PA Diastolic Pressure 6 mmHg  PA Mean 20 mmHg  PW A Wave 16 mmHg  PW V Wave 16 mmHg  PW Mean 12 mmHg  AO Systolic Pressure 381 mmHg  AO Diastolic Pressure 48 mmHg  AO Mean 72 mmHg  LV Systolic Pressure 829 mmHg  LV Diastolic Pressure 11 mmHg  LV EDP 27 mmHg  AOp Systolic Pressure 937 mmHg  AOp Diastolic Pressure 56 mmHg  AOp Mean Pressure 74 mmHg  LVp Systolic Pressure 169 mmHg  LVp Diastolic Pressure 10 mmHg  LVp EDP Pressure 23 mmHg  QP/QS 1  TPVR Index 8.85 HRUI  TSVR Index 31.85 HRUI  PVR SVR Ratio 0.12  TPVR/TSVR Ratio 0.28     Cardiac TAVR CT  TECHNIQUE: The patient was scanned on a [employer] scanner. A 120 kV retrospective scan was triggered in the descending thoracic aorta at 111  HU's. Gantry rotation speed was 270 msecs and collimation was .9 mm. No beta blockade or nitro were given. The 3D data set was reconstructed in 5% intervals of the R-R cycle. Systolic and diastolic phases were analyzed on a dedicated work station using MPR, MIP and VRT modes. The patient received 80 cc of contrast.  FINDINGS: Aortic Valve: Calcified and tri leaflet with restricted motion  Aorta: Non aneurysmal with bovine arch and moderate calcific atherosclerotic debris  Sinotubular Junction: 24 mm  Ascending Thoracic Aorta: 31 mm  Aortic Arch: 27 mm  Descending Thoracic Aorta: 23 mm  Sinus of Valsalva Measurements:  Non-coronary: 28 mm  Right - coronary: 29 mm  Left - coronary: 30 mm  Coronary Artery Height above Annulus:  Left Main: Shallow at 9.1 mm above annulus  Right Coronary: 11.6 mm above annulus  Virtual Basal Annulus Measurements:  Maximum/Minimum Diameter: 19.1 mm x 25 mm  Perimeter: 70.7 mm  Area: 362 mm2  Coronary Arteries: LM a bit shallow for deployment at 9.1 mm RCA is ok  Optimum Fluoroscopic Angle for Delivery: LAO 14 degrees Caudal 17 degrees  IMPRESSION: 1. Calcified tri leaflet AV with annular area of 362 suitable for a 23 mm Sapien 3 valve. Extensive calcium noted at the base of all 3 leaflets just above the annulus  2. Optimum angiographic angle for deployment LAO 14 Caudal 17 degrees  3.  No LAA thrombus  4.  Normal aortic root 3.1 cm with bovine arch  5. Shallow LM coronary only 9.1 mm above annulus and RCA is ok at 11.6  mm above annulus  Jenkins Rouge   Electronically Signed   By: Jenkins Rouge M.D.   On: 02/07/2018 17:16    CT ANGIOGRAPHY CHEST, ABDOMEN AND PELVIS  TECHNIQUE: Multidetector CT imaging through the chest, abdomen and pelvis was performed using the standard protocol during bolus administration of intravenous contrast. Multiplanar reconstructed images and MIPs  were obtained and reviewed to evaluate the vascular anatomy.  CONTRAST:  135mL ISOVUE-370 IOPAMIDOL (ISOVUE-370) INJECTION 76%  COMPARISON:  Chest CT 08/24/2016. CT the abdomen and pelvis 03/23/2017.  FINDINGS: CTA CHEST FINDINGS  Cardiovascular: Heart size is normal. There is no significant pericardial fluid, thickening or pericardial calcification. There is aortic atherosclerosis, as well as atherosclerosis of the great vessels of the mediastinum and the coronary arteries, including calcified atherosclerotic plaque in the left main, left anterior descending, left circumflex and right coronary arteries. Severe thickening calcification of the aortic valve.  Mediastinum/Lymph Nodes: No pathologically enlarged mediastinal or hilar lymph nodes. Several densely calcified right hilar lymph nodes are incidentally noted. Esophagus is unremarkable in appearance. No axillary lymphadenopathy.  Lungs/Pleura: Smoothly marginated 5 mm right lower lobe pulmonary nodule (axial image 52 of series 5), and 5 mm left upper lobe nodule (axial image 27 of series 5), both unchanged in retrospect compared to remote prior study from 08/24/2016, considered definitively benign. No other larger more suspicious appearing pulmonary nodules or masses are noted. No acute consolidative airspace disease. No pleural effusions.  Musculoskeletal/Soft Tissues: Multiple old healed rib fractures. There are no aggressive appearing lytic or blastic lesions noted in the visualized portions of the skeleton.  CTA ABDOMEN AND PELVIS FINDINGS  Hepatobiliary: Calcified granulomas in the liver. No suspicious cystic or solid hepatic lesions. No intra or extrahepatic biliary ductal dilatation. Gallbladder is normal in appearance.  Pancreas: No pancreatic mass. No pancreatic ductal dilatation. No pancreatic or peripancreatic fluid or inflammatory changes.  Spleen: Unremarkable.  Adrenals/Urinary Tract:  Multiple low-attenuation lesions in the kidneys bilaterally, largest of which are compatible with simple cysts, measuring up to 3.8 cm in the anterior aspect of the lower pole of the right kidney. Smaller subcentimeter low-attenuation lesions in the kidneys are too small to definitively characterize, but are also favored to represent tiny cysts. Bilateral adrenal glands are normal in appearance. No hydroureteronephrosis. Urinary bladder is normal in appearance.  Stomach/Bowel: Normal appearance of the stomach. No pathologic dilatation of small bowel or colon. Numerous colonic diverticulae are noted, particularly in the sigmoid colon, without surrounding inflammatory changes to suggest an acute diverticulitis at this time. Normal appendix. Short segment of ileum extends into a large right inguinal hernia.  Vascular/Lymphatic: Aortic atherosclerosis with vascular findings and measurements pertinent to potential TAVR procedure, as detailed below. No aneurysm or dissection noted in the abdominal or pelvic vasculature. Circumaortic left renal vein (normal anatomical variant) incidentally noted. Celiac axis, superior mesenteric artery and inferior mesenteric artery are all widely patent without hemodynamically significant stenosis, as are single renal arteries bilaterally. No lymphadenopathy noted in the abdomen or pelvis.  Reproductive: Prostate gland and seminal vesicles are unremarkable in appearance.  Other: No significant volume of ascites.  No pneumoperitoneum.  Musculoskeletal: There are no aggressive appearing lytic or blastic lesions noted in the visualized portions of the skeleton.  VASCULAR MEASUREMENTS PERTINENT TO TAVR:  AORTA:  Minimal Aortic Diameter-15 x 15 mm  Severity of Aortic Calcification-moderate  RIGHT PELVIS:  Right Common Iliac Artery -  Minimal Diameter-9.6 x 8.6 mm  Tortuosity-mild  Calcification - mild  Right External Iliac  Artery -  Minimal Diameter-7.7 x 6.3 mm  Tortuosity-mild-to-moderate  Calcification - mild  Right Common Femoral Artery -  Minimal Diameter-9.0 x 7.6 mm  Tortuosity-mild  Calcification - mild  LEFT PELVIS:  Left Common Iliac Artery -  Minimal Diameter-10.3 x 9.7 mm  Tortuosity-mild-to-moderate  Calcification - mild  Left External Iliac Artery -  Minimal Diameter-8.1 x 6.3 mm  Tortuosity-mild-to-moderate  Calcification - mild  Left Common Femoral Artery -  Minimal Diameter-8.1 x 7.6 mm  Tortuosity-mild  Calcification - mild  Review of the MIP images confirms the above findings.  IMPRESSION: 1. Vascular findings and measurements pertinent to potential TAVR procedure, as detailed above. 2. Severe thickening calcification of the aortic valve, compatible with the reported clinical history of severe aortic stenosis. 3. Severe colonic diverticulosis without evidence to suggest an acute diverticulitis at this time. 4. Aortic atherosclerosis, in addition to left main and 3 vessel coronary artery disease. 5. Additional incidental findings, as above.  Aortic Atherosclerosis (ICD10-I70.0).   Electronically Signed   By: Vinnie Langton M.D.   On: 02/07/2018 15:53   Impression:  Patient has stage D severe symptomatic aortic stenosis.  He describes progressive symptoms of exertional shortness of breath consistent with chronic diastolic congestive heart failure, New York Heart Association functional class II.  I personally reviewed the patient's recent transthoracic echocardiogram, diagnostic cardiac catheterization, and CT angiograms.  Echocardiogram reveals the presence of severe aortic stenosis.  There is normal left ventricular systolic function.  Peak velocity across the aortic valve was measured 4.5 m/s corresponding to mean transvalvular gradient estimated 43 mmHg.  Diagnostic cardiac catheterization confirmed the presence of severe  aortic stenosis and was notable for the absence of significant coronary artery disease.  There is no question the patient would benefit from aortic valve replacement.  Cardiac-gated CTA of the heart reveals anatomical characteristics consistent with aortic stenosis suitable for treatment by transcatheter aortic valve replacement without any significant complicating features, although the size of the aortic annulus is relatively small and there is extensive calcification involving all 3 commissures which extends down to the level of the aortic annulus but not down into the LV outflow tract.  Risks associated with conventional surgical aortic valve replacement might be elevated somewhat higher than that predicted using the STS risk calculator because of the possibility of need for aortic root enlargement or root replacement to avoid patient prosthesis mismatch.  CTA of the aorta and iliac vessels demonstrate what appears to be adequate pelvic vascular access to facilitate a transfemoral approach.  Under the circumstances I would favor transcatheter aortic valve replacement as an alternative to conventional surgery because of the patient's advanced age and relatively small sized aortic root.     Plan:  With the patient's daughters and the interpreter from [facility] functioning as interpreters, the patient and his family were counseled at length regarding treatment alternatives for management of severe symptomatic aortic stenosis. Alternative approaches such as conventional aortic valve replacement, transcatheter aortic valve replacement, and continued medical therapy without intervention were compared and contrasted at length.  The risks associated with conventional surgical aortic valve replacement were discussed in detail, as were expectations for post-operative convalescence.  Issues specific to transcatheter aortic valve replacement were discussed including questions about long term valve durability, the  potential for paravalvular leak, possible increased risk of need for permanent pacemaker placement, and other technical complications related to the procedure itself.  Long-term prognosis with medical therapy was discussed. This discussion was placed in the  context of the patient's own specific clinical presentation and past medical history.  All of their questions have been addressed.  The patient hopes to proceed with transcatheter aortic valve replacement in the near future.  However, the patient's daughters wish to discuss the timing of the intervention amongst themselves before scheduling the procedure.  Following the decision to proceed with transcatheter aortic valve replacement, a discussion has been held regarding what types of management strategies would be attempted intraoperatively in the event of life-threatening complications, including whether or not the patient would be considered a candidate for the use of cardiopulmonary bypass and/or conversion to open sternotomy for attempted surgical intervention.  The patient has been advised of a variety of complications that might develop including but not limited to risks of death, stroke, paravalvular leak, aortic dissection or other major vascular complications, aortic annulus rupture, device embolization, cardiac rupture or perforation, mitral regurgitation, acute myocardial infarction, arrhythmia, heart block or bradycardia requiring permanent pacemaker placement, congestive heart failure, respiratory failure, renal failure, pneumonia, infection, other late complications related to structural valve deterioration or migration, or other complications that might ultimately cause a temporary or permanent loss of functional independence or other long term morbidity.  The patient provides full informed consent for the procedure as described and all questions were answered.    I spent in excess of 90 minutes during the conduct of this office consultation  and >50% of this time involved direct face-to-face encounter with the patient for counseling and/or coordination of their care.   Valentina Gu. Roxy Manns, MD 02/22/2018 4:22 PM

## 2018-02-22 NOTE — Patient Instructions (Signed)
Continue all previous medications without any changes at this time  

## 2018-02-26 ENCOUNTER — Other Ambulatory Visit: Payer: Self-pay

## 2018-02-26 DIAGNOSIS — I350 Nonrheumatic aortic (valve) stenosis: Secondary | ICD-10-CM

## 2018-02-26 DIAGNOSIS — R0602 Shortness of breath: Secondary | ICD-10-CM

## 2018-02-28 DIAGNOSIS — I5031 Acute diastolic (congestive) heart failure: Secondary | ICD-10-CM | POA: Diagnosis not present

## 2018-02-28 DIAGNOSIS — I110 Hypertensive heart disease with heart failure: Secondary | ICD-10-CM | POA: Diagnosis not present

## 2018-02-28 DIAGNOSIS — I2510 Atherosclerotic heart disease of native coronary artery without angina pectoris: Secondary | ICD-10-CM | POA: Diagnosis not present

## 2018-02-28 DIAGNOSIS — M069 Rheumatoid arthritis, unspecified: Secondary | ICD-10-CM | POA: Diagnosis not present

## 2018-02-28 DIAGNOSIS — N401 Enlarged prostate with lower urinary tract symptoms: Secondary | ICD-10-CM | POA: Diagnosis not present

## 2018-02-28 DIAGNOSIS — I350 Nonrheumatic aortic (valve) stenosis: Secondary | ICD-10-CM | POA: Diagnosis not present

## 2018-03-02 DIAGNOSIS — M069 Rheumatoid arthritis, unspecified: Secondary | ICD-10-CM | POA: Diagnosis not present

## 2018-03-02 DIAGNOSIS — I110 Hypertensive heart disease with heart failure: Secondary | ICD-10-CM | POA: Diagnosis not present

## 2018-03-02 DIAGNOSIS — I350 Nonrheumatic aortic (valve) stenosis: Secondary | ICD-10-CM | POA: Diagnosis not present

## 2018-03-02 DIAGNOSIS — N401 Enlarged prostate with lower urinary tract symptoms: Secondary | ICD-10-CM | POA: Diagnosis not present

## 2018-03-02 DIAGNOSIS — I5031 Acute diastolic (congestive) heart failure: Secondary | ICD-10-CM | POA: Diagnosis not present

## 2018-03-02 DIAGNOSIS — I2510 Atherosclerotic heart disease of native coronary artery without angina pectoris: Secondary | ICD-10-CM | POA: Diagnosis not present

## 2018-03-06 DIAGNOSIS — I5031 Acute diastolic (congestive) heart failure: Secondary | ICD-10-CM | POA: Diagnosis not present

## 2018-03-06 DIAGNOSIS — M069 Rheumatoid arthritis, unspecified: Secondary | ICD-10-CM | POA: Diagnosis not present

## 2018-03-06 DIAGNOSIS — I2510 Atherosclerotic heart disease of native coronary artery without angina pectoris: Secondary | ICD-10-CM | POA: Diagnosis not present

## 2018-03-06 DIAGNOSIS — N401 Enlarged prostate with lower urinary tract symptoms: Secondary | ICD-10-CM | POA: Diagnosis not present

## 2018-03-06 DIAGNOSIS — I350 Nonrheumatic aortic (valve) stenosis: Secondary | ICD-10-CM | POA: Diagnosis not present

## 2018-03-06 DIAGNOSIS — I110 Hypertensive heart disease with heart failure: Secondary | ICD-10-CM | POA: Diagnosis not present

## 2018-03-07 DIAGNOSIS — I2510 Atherosclerotic heart disease of native coronary artery without angina pectoris: Secondary | ICD-10-CM | POA: Diagnosis not present

## 2018-03-07 DIAGNOSIS — I110 Hypertensive heart disease with heart failure: Secondary | ICD-10-CM | POA: Diagnosis not present

## 2018-03-07 DIAGNOSIS — M069 Rheumatoid arthritis, unspecified: Secondary | ICD-10-CM | POA: Diagnosis not present

## 2018-03-07 DIAGNOSIS — I350 Nonrheumatic aortic (valve) stenosis: Secondary | ICD-10-CM | POA: Diagnosis not present

## 2018-03-07 DIAGNOSIS — N401 Enlarged prostate with lower urinary tract symptoms: Secondary | ICD-10-CM | POA: Diagnosis not present

## 2018-03-07 DIAGNOSIS — I5031 Acute diastolic (congestive) heart failure: Secondary | ICD-10-CM | POA: Diagnosis not present

## 2018-03-21 DIAGNOSIS — I2510 Atherosclerotic heart disease of native coronary artery without angina pectoris: Secondary | ICD-10-CM | POA: Diagnosis not present

## 2018-03-21 DIAGNOSIS — N401 Enlarged prostate with lower urinary tract symptoms: Secondary | ICD-10-CM | POA: Diagnosis not present

## 2018-03-21 DIAGNOSIS — I5031 Acute diastolic (congestive) heart failure: Secondary | ICD-10-CM | POA: Diagnosis not present

## 2018-03-21 DIAGNOSIS — M069 Rheumatoid arthritis, unspecified: Secondary | ICD-10-CM | POA: Diagnosis not present

## 2018-03-21 DIAGNOSIS — I110 Hypertensive heart disease with heart failure: Secondary | ICD-10-CM | POA: Diagnosis not present

## 2018-03-21 DIAGNOSIS — I350 Nonrheumatic aortic (valve) stenosis: Secondary | ICD-10-CM | POA: Diagnosis not present

## 2018-03-22 NOTE — Pre-Procedure Instructions (Signed)
[place]  03/22/2018      [id number] [address] [place] [address] Phone: [phone] Fax: [phone]  [facility] [phone], [place] [address] Phone: [phone] Fax: [phone]    Your procedure is scheduled on Tuesday October 15.  Report to [facility] Admitting at 5:30 A.M.  Call this number if you have problems the morning of surgery:  [phone]   Remember:  Do not eat or drink after midnight.    Take these medicines the morning of surgery with A SIP OF WATER: NONE  7 days prior to surgery STOP taking any Aleve, Naproxen, Ibuprofen, Motrin, Advil, Goody's, BC's, all herbal medications, fish oil, and all vitamins     Do not wear jewelry  Do not wear lotions, powders, or colognes, or deodorant.  Do not shave 48 hours prior to surgery.  Men may shave face and neck.  Do not bring valuables to the hospital.  [facility] is not responsible for any belongings or valuables.  Contacts, dentures or bridgework may not be worn into surgery.  Leave your suitcase in the car.  After surgery it may be brought to your room.  For patients admitted to the hospital, discharge time will be determined by your treatment team.  Patients discharged the day of surgery will not be allowed to drive home.   Special instructions:    [place]- Preparing For Surgery  Before surgery, you can play an important role. Because skin is not sterile, your skin needs to be as free of germs as possible. You can reduce the number of germs on your skin by washing with CHG (chlorahexidine gluconate) Soap before surgery.  CHG is an antiseptic cleaner which kills germs and bonds with the skin to continue killing germs even after washing.    Oral Hygiene is also important to reduce your risk of infection.   Remember - BRUSH YOUR TEETH THE MORNING OF SURGERY WITH YOUR REGULAR TOOTHPASTE  Please do not use if you have an allergy to CHG or antibacterial soaps. If your skin becomes reddened/irritated stop using the CHG.  Do not shave (including legs and underarms) for at least 48 hours prior to first CHG shower. It is OK to shave your face.  Please follow these instructions carefully.   1. Shower the NIGHT BEFORE SURGERY and the MORNING OF SURGERY with CHG.   2. If you chose to wash your hair, wash your hair first as usual with your normal shampoo.  3. After you shampoo, rinse your hair and body thoroughly to remove the shampoo.  4. Use CHG as you would any other liquid soap. You can apply CHG directly to the skin and wash gently with a scrungie or a clean washcloth.   5. Apply the CHG Soap to your body ONLY FROM THE NECK DOWN.  Do not use on open wounds or open sores. Avoid contact with your eyes, ears, mouth and genitals (private parts). Wash Face and genitals (private parts)  with your normal soap.  6. Wash thoroughly, paying special attention to the area where your surgery will be performed.  7. Thoroughly rinse your body with warm water from the neck down.  8. DO NOT shower/wash with your normal soap after using and rinsing off the CHG Soap.  9. [place]  yourself dry with a CLEAN TOWEL.  10. Wear CLEAN PAJAMAS to bed the night before surgery, wear comfortable clothes the morning of surgery  11. Place CLEAN SHEETS on your bed the night of your first shower and DO NOT SLEEP WITH PETS.    Day of Surgery:  Do not apply any deodorants/lotions.  Please wear clean clothes to the hospital/surgery center.   Remember to brush your teeth WITH YOUR REGULAR TOOTHPASTE.    Please read over the following fact sheets that you were given. Coughing and Deep Breathing, MRSA Information and Surgical Site Infection Prevention

## 2018-03-23 ENCOUNTER — Ambulatory Visit (HOSPITAL_COMMUNITY)
Admission: RE | Admit: 2018-03-23 | Discharge: 2018-03-23 | Disposition: A | Discharge status: Home / Self Care | Payer: Medicare Other | Source: Ambulatory Visit | Attending: Cardiovascular Disease | Admitting: Cardiovascular Disease

## 2018-03-23 ENCOUNTER — Encounter (HOSPITAL_COMMUNITY)
Admission: RE | Admit: 2018-03-23 | Discharge: 2018-03-23 | Disposition: A | Discharge status: Home / Self Care | Payer: Medicare Other | Source: Ambulatory Visit | Attending: Cardiovascular Disease | Admitting: Cardiovascular Disease

## 2018-03-23 ENCOUNTER — Encounter (HOSPITAL_COMMUNITY): Payer: Self-pay

## 2018-03-23 ENCOUNTER — Other Ambulatory Visit: Payer: Self-pay

## 2018-03-23 DIAGNOSIS — Z7902 Long term (current) use of antithrombotics/antiplatelets: Secondary | ICD-10-CM | POA: Insufficient documentation

## 2018-03-23 DIAGNOSIS — Z01818 Encounter for other preprocedural examination: Secondary | ICD-10-CM | POA: Diagnosis not present

## 2018-03-23 DIAGNOSIS — K219 Gastro-esophageal reflux disease without esophagitis: Secondary | ICD-10-CM | POA: Diagnosis not present

## 2018-03-23 DIAGNOSIS — Z79899 Other long term (current) drug therapy: Secondary | ICD-10-CM | POA: Insufficient documentation

## 2018-03-23 DIAGNOSIS — I10 Essential (primary) hypertension: Secondary | ICD-10-CM | POA: Diagnosis not present

## 2018-03-23 DIAGNOSIS — Z953 Presence of xenogenic heart valve: Secondary | ICD-10-CM | POA: Diagnosis not present

## 2018-03-23 DIAGNOSIS — M069 Rheumatoid arthritis, unspecified: Secondary | ICD-10-CM | POA: Diagnosis not present

## 2018-03-23 DIAGNOSIS — I350 Nonrheumatic aortic (valve) stenosis: Secondary | ICD-10-CM

## 2018-03-23 DIAGNOSIS — Z7982 Long term (current) use of aspirin: Secondary | ICD-10-CM | POA: Diagnosis not present

## 2018-03-23 DIAGNOSIS — R001 Bradycardia, unspecified: Secondary | ICD-10-CM | POA: Insufficient documentation

## 2018-03-23 DIAGNOSIS — E785 Hyperlipidemia, unspecified: Secondary | ICD-10-CM | POA: Diagnosis not present

## 2018-03-23 DIAGNOSIS — Z833 Family history of diabetes mellitus: Secondary | ICD-10-CM | POA: Diagnosis not present

## 2018-03-23 LAB — COMPREHENSIVE METABOLIC PANEL
ALT: 20 U/L (ref 0–44)
AST: 28 U/L (ref 15–41)
Albumin: 4 g/dL (ref 3.5–5.0)
Alkaline Phosphatase: 55 U/L (ref 38–126)
Anion gap: 9 (ref 5–15)
BUN: 13 mg/dL (ref 8–23)
CHLORIDE: 106 mmol/L (ref 98–111)
CO2: 23 mmol/L (ref 22–32)
Calcium: 8.9 mg/dL (ref 8.9–10.3)
Creatinine, Ser: 0.97 mg/dL (ref 0.61–1.24)
Glucose, Bld: 151 mg/dL — ABNORMAL HIGH (ref 70–99)
POTASSIUM: 4.3 mmol/L (ref 3.5–5.1)
SODIUM: 138 mmol/L (ref 135–145)
Total Bilirubin: 1.2 mg/dL (ref 0.3–1.2)
Total Protein: 7 g/dL (ref 6.5–8.1)

## 2018-03-23 LAB — TYPE AND SCREEN
ABO/RH(D): B POS
Antibody Screen: NEGATIVE

## 2018-03-23 LAB — URINALYSIS, ROUTINE W REFLEX MICROSCOPIC
Bilirubin Urine: NEGATIVE
GLUCOSE, UA: NEGATIVE mg/dL
Hgb urine dipstick: NEGATIVE
Ketones, ur: NEGATIVE mg/dL
Leukocytes, UA: NEGATIVE
Nitrite: NEGATIVE
PH: 5 (ref 5.0–8.0)
Protein, ur: NEGATIVE mg/dL
SPECIFIC GRAVITY, URINE: 1.01 (ref 1.005–1.030)

## 2018-03-23 LAB — HEMOGLOBIN A1C
HEMOGLOBIN A1C: 5.6 % (ref 4.8–5.6)
MEAN PLASMA GLUCOSE: 114.02 mg/dL

## 2018-03-23 LAB — BLOOD GAS, ARTERIAL
Acid-Base Excess: 1.5 mmol/L (ref 0.0–2.0)
Bicarbonate: 26 mmol/L (ref 20.0–28.0)
DRAWN BY: 449841
FIO2: 0.21
O2 Saturation: 97.2 %
PCO2 ART: 43.8 mmHg (ref 32.0–48.0)
PH ART: 7.391 (ref 7.350–7.450)
Patient temperature: 98.6
pO2, Arterial: 96.2 mmHg (ref 83.0–108.0)

## 2018-03-23 LAB — CBC
HCT: 41.7 % (ref 39.0–52.0)
Hemoglobin: 12.6 g/dL — ABNORMAL LOW (ref 13.0–17.0)
MCH: 28.4 pg (ref 26.0–34.0)
MCHC: 30.2 g/dL (ref 30.0–36.0)
MCV: 93.9 fL (ref 80.0–100.0)
NRBC: 0 % (ref 0.0–0.2)
PLATELETS: 167 10*3/uL (ref 150–400)
RBC: 4.44 MIL/uL (ref 4.22–5.81)
RDW: 13.2 % (ref 11.5–15.5)
WBC: 4.4 10*3/uL (ref 4.0–10.5)

## 2018-03-23 LAB — PROTIME-INR
INR: 1.1
PROTHROMBIN TIME: 14.1 s (ref 11.4–15.2)

## 2018-03-23 LAB — SURGICAL PCR SCREEN
MRSA, PCR: NEGATIVE
Staphylococcus aureus: POSITIVE — AB

## 2018-03-23 LAB — APTT: APTT: 32 s (ref 24–36)

## 2018-03-23 LAB — BRAIN NATRIURETIC PEPTIDE: B NATRIURETIC PEPTIDE 5: 78 pg/mL (ref 0.0–100.0)

## 2018-03-23 NOTE — Progress Notes (Signed)
PCP - Dr. Lajean Manes Cardiologist - Dr. Candee Furbish  Chest x-ray - 03/23/18 EKG - 03/23/18 ECHO - 02/05/18 Cardiac Cath - 02/06/18  Sleep Study - denies  Aspirin Instructions: N/A  Anesthesia review: Yes  Patient denies shortness of breath, fever, cough and chest pain at PAT appointment   Patient verbalized understanding of instructions that were given to them at the PAT appointment. Patient was also instructed that they will need to review over the PAT instructions again at home before surgery.

## 2018-03-26 MED ORDER — POTASSIUM CHLORIDE 2 MEQ/ML IV SOLN
80.0000 meq | INTRAVENOUS | Status: DC
  Filled 2018-03-26: qty 40

## 2018-03-26 MED ORDER — EPINEPHRINE PF 1 MG/ML IJ SOLN
0.0000 ug/min | INTRAVENOUS | Status: DC
  Filled 2018-03-26: qty 4

## 2018-03-26 MED ORDER — MAGNESIUM SULFATE 50 % IJ SOLN
40.0000 meq | INTRAMUSCULAR | Status: DC
  Filled 2018-03-26: qty 9.85

## 2018-03-26 MED ORDER — PHENYLEPHRINE HCL-NACL 20-0.9 MG/250ML-% IV SOLN
30.0000 ug/min | INTRAVENOUS | Status: DC
  Filled 2018-03-26: qty 250

## 2018-03-26 MED ORDER — SODIUM CHLORIDE 0.9 % IV SOLN
INTRAVENOUS | Status: DC
  Filled 2018-03-26: qty 30

## 2018-03-26 MED ORDER — DEXMEDETOMIDINE HCL IN NACL 400 MCG/100ML IV SOLN
0.1000 ug/kg/h | INTRAVENOUS | Status: AC
  Administered 2018-03-27: 7.6 ug/kg/h via INTRAVENOUS
  Filled 2018-03-26: qty 100

## 2018-03-26 MED ORDER — VANCOMYCIN HCL 10 G IV SOLR
1250.0000 mg | INTRAVENOUS | Status: AC
  Administered 2018-03-27: 1250 mg via INTRAVENOUS
  Filled 2018-03-26: qty 1250

## 2018-03-26 MED ORDER — DOPAMINE-DEXTROSE 3.2-5 MG/ML-% IV SOLN
0.0000 ug/kg/min | INTRAVENOUS | Status: DC
  Filled 2018-03-26: qty 250

## 2018-03-26 MED ORDER — NITROGLYCERIN IN D5W 200-5 MCG/ML-% IV SOLN
2.0000 ug/min | INTRAVENOUS | Status: DC
  Filled 2018-03-26: qty 250

## 2018-03-26 MED ORDER — SODIUM CHLORIDE 0.9 % IV SOLN
1.5000 g | INTRAVENOUS | Status: AC
  Administered 2018-03-27: 1.5 g via INTRAVENOUS
  Filled 2018-03-26: qty 1.5

## 2018-03-26 MED ORDER — NOREPINEPHRINE 4 MG/250ML-% IV SOLN
0.0000 ug/min | INTRAVENOUS | Status: AC
  Administered 2018-03-27: 1 ug/min via INTRAVENOUS
  Filled 2018-03-26: qty 250

## 2018-03-26 MED ORDER — INSULIN REGULAR(HUMAN) IN NACL 100-0.9 UT/100ML-% IV SOLN
INTRAVENOUS | Status: DC
  Filled 2018-03-26: qty 100

## 2018-03-27 ENCOUNTER — Encounter (HOSPITAL_COMMUNITY)
Admission: RE | Disposition: A | Discharge status: Home / Self Care | Payer: Self-pay | Source: Ambulatory Visit | Attending: Thoracic Surgery (Cardiothoracic Vascular Surgery)

## 2018-03-27 ENCOUNTER — Other Ambulatory Visit: Payer: Self-pay

## 2018-03-27 ENCOUNTER — Inpatient Hospital Stay (HOSPITAL_COMMUNITY): Payer: Medicare Other | Admitting: Certified Registered"

## 2018-03-27 ENCOUNTER — Encounter (HOSPITAL_COMMUNITY): Payer: Self-pay | Admitting: Surgery

## 2018-03-27 ENCOUNTER — Inpatient Hospital Stay (HOSPITAL_COMMUNITY): Payer: Medicare Other

## 2018-03-27 ENCOUNTER — Inpatient Hospital Stay (HOSPITAL_COMMUNITY): Payer: Medicare Other | Admitting: Emergency Medicine

## 2018-03-27 ENCOUNTER — Inpatient Hospital Stay (HOSPITAL_COMMUNITY)
Admission: RE | Admit: 2018-03-27 | Discharge: 2018-03-28 | DRG: 267 | Disposition: A | Discharge status: Home / Self Care | Payer: Medicare Other | Source: Ambulatory Visit | Attending: Thoracic Surgery (Cardiothoracic Vascular Surgery) | Admitting: Thoracic Surgery (Cardiothoracic Vascular Surgery)

## 2018-03-27 DIAGNOSIS — I350 Nonrheumatic aortic (valve) stenosis: Secondary | ICD-10-CM

## 2018-03-27 DIAGNOSIS — I110 Hypertensive heart disease with heart failure: Secondary | ICD-10-CM | POA: Diagnosis not present

## 2018-03-27 DIAGNOSIS — R001 Bradycardia, unspecified: Secondary | ICD-10-CM | POA: Diagnosis present

## 2018-03-27 DIAGNOSIS — I712 Thoracic aortic aneurysm, without rupture: Secondary | ICD-10-CM | POA: Diagnosis not present

## 2018-03-27 DIAGNOSIS — Z006 Encounter for examination for normal comparison and control in clinical research program: Secondary | ICD-10-CM

## 2018-03-27 DIAGNOSIS — H9190 Unspecified hearing loss, unspecified ear: Secondary | ICD-10-CM | POA: Diagnosis not present

## 2018-03-27 DIAGNOSIS — Z952 Presence of prosthetic heart valve: Secondary | ICD-10-CM

## 2018-03-27 DIAGNOSIS — Z87891 Personal history of nicotine dependence: Secondary | ICD-10-CM | POA: Diagnosis not present

## 2018-03-27 DIAGNOSIS — K219 Gastro-esophageal reflux disease without esophagitis: Secondary | ICD-10-CM | POA: Diagnosis not present

## 2018-03-27 DIAGNOSIS — F32A Depression, unspecified: Secondary | ICD-10-CM | POA: Diagnosis present

## 2018-03-27 DIAGNOSIS — I5032 Chronic diastolic (congestive) heart failure: Secondary | ICD-10-CM | POA: Diagnosis present

## 2018-03-27 DIAGNOSIS — M069 Rheumatoid arthritis, unspecified: Secondary | ICD-10-CM | POA: Diagnosis present

## 2018-03-27 DIAGNOSIS — F329 Major depressive disorder, single episode, unspecified: Secondary | ICD-10-CM | POA: Diagnosis present

## 2018-03-27 DIAGNOSIS — Z888 Allergy status to other drugs, medicaments and biological substances status: Secondary | ICD-10-CM

## 2018-03-27 DIAGNOSIS — Z79899 Other long term (current) drug therapy: Secondary | ICD-10-CM

## 2018-03-27 DIAGNOSIS — I2510 Atherosclerotic heart disease of native coronary artery without angina pectoris: Secondary | ICD-10-CM | POA: Diagnosis present

## 2018-03-27 DIAGNOSIS — I10 Essential (primary) hypertension: Secondary | ICD-10-CM | POA: Diagnosis present

## 2018-03-27 DIAGNOSIS — K5790 Diverticulosis of intestine, part unspecified, without perforation or abscess without bleeding: Secondary | ICD-10-CM | POA: Diagnosis present

## 2018-03-27 DIAGNOSIS — I361 Nonrheumatic tricuspid (valve) insufficiency: Secondary | ICD-10-CM | POA: Diagnosis not present

## 2018-03-27 DIAGNOSIS — N400 Enlarged prostate without lower urinary tract symptoms: Secondary | ICD-10-CM | POA: Diagnosis not present

## 2018-03-27 DIAGNOSIS — E785 Hyperlipidemia, unspecified: Secondary | ICD-10-CM | POA: Diagnosis not present

## 2018-03-27 HISTORY — PX: INTRAOPERATIVE TRANSTHORACIC ECHOCARDIOGRAM: SHX6523

## 2018-03-27 HISTORY — PX: TRANSCATHETER AORTIC VALVE REPLACEMENT, TRANSFEMORAL: SHX6400

## 2018-03-27 HISTORY — DX: Presence of prosthetic heart valve: Z95.2

## 2018-03-27 HISTORY — DX: Hyperlipidemia, unspecified: E78.5

## 2018-03-27 LAB — POCT I-STAT, CHEM 8
BUN: 15 mg/dL (ref 8–23)
BUN: 14 mg/dL (ref 8–23)
BUN: 14 mg/dL (ref 8–23)
BUN: 14 mg/dL (ref 8–23)
CALCIUM ION: 1.2 mmol/L (ref 1.15–1.40)
CALCIUM ION: 1.21 mmol/L (ref 1.15–1.40)
CHLORIDE: 103 mmol/L (ref 98–111)
CHLORIDE: 103 mmol/L (ref 98–111)
CREATININE: 0.9 mg/dL (ref 0.61–1.24)
CREATININE: 0.9 mg/dL (ref 0.61–1.24)
CREATININE: 0.9 mg/dL (ref 0.61–1.24)
Calcium, Ion: 1.19 mmol/L (ref 1.15–1.40)
Calcium, Ion: 1.21 mmol/L (ref 1.15–1.40)
Chloride: 103 mmol/L (ref 98–111)
Chloride: 104 mmol/L (ref 98–111)
Creatinine, Ser: 0.9 mg/dL (ref 0.61–1.24)
GLUCOSE: 130 mg/dL — AB (ref 70–99)
GLUCOSE: 147 mg/dL — AB (ref 70–99)
Glucose, Bld: 152 mg/dL — ABNORMAL HIGH (ref 70–99)
Glucose, Bld: 160 mg/dL — ABNORMAL HIGH (ref 70–99)
HCT: 37 % — ABNORMAL LOW (ref 39.0–52.0)
HCT: 34 % — ABNORMAL LOW (ref 39.0–52.0)
HEMATOCRIT: 32 % — AB (ref 39.0–52.0)
HEMATOCRIT: 34 % — AB (ref 39.0–52.0)
HEMOGLOBIN: 11.6 g/dL — AB (ref 13.0–17.0)
Hemoglobin: 12.6 g/dL — ABNORMAL LOW (ref 13.0–17.0)
Hemoglobin: 10.9 g/dL — ABNORMAL LOW (ref 13.0–17.0)
Hemoglobin: 11.6 g/dL — ABNORMAL LOW (ref 13.0–17.0)
POTASSIUM: 4.4 mmol/L (ref 3.5–5.1)
Potassium: 4.2 mmol/L (ref 3.5–5.1)
Potassium: 4.1 mmol/L (ref 3.5–5.1)
Potassium: 4.3 mmol/L (ref 3.5–5.1)
Sodium: 141 mmol/L (ref 135–145)
Sodium: 140 mmol/L (ref 135–145)
Sodium: 140 mmol/L (ref 135–145)
Sodium: 141 mmol/L (ref 135–145)
TCO2: 26 mmol/L (ref 22–32)
TCO2: 27 mmol/L (ref 22–32)
TCO2: 27 mmol/L (ref 22–32)
TCO2: 27 mmol/L (ref 22–32)

## 2018-03-27 SURGERY — IMPLANTATION, AORTIC VALVE, TRANSCATHETER, FEMORAL APPROACH
Anesthesia: Monitor Anesthesia Care | Site: Chest

## 2018-03-27 MED ORDER — OXYCODONE HCL 5 MG PO TABS: 5.0000 mg | ORAL_TABLET | ORAL | Status: DC | PRN

## 2018-03-27 MED ORDER — ACETAMINOPHEN 325 MG PO TABS
650.0000 mg | ORAL_TABLET | Freq: Four times a day (QID) | ORAL | Status: DC | PRN
  Administered 2018-03-27 (×2): 650 mg via ORAL
  Filled 2018-03-27 (×2): qty 2

## 2018-03-27 MED ORDER — PHENYLEPHRINE 40 MCG/ML (10ML) SYRINGE FOR IV PUSH (FOR BLOOD PRESSURE SUPPORT)
PREFILLED_SYRINGE | INTRAVENOUS | Status: AC
  Filled 2018-03-27: qty 10

## 2018-03-27 MED ORDER — ONDANSETRON HCL 4 MG/2ML IJ SOLN: 4.0000 mg | Freq: Four times a day (QID) | INTRAMUSCULAR | Status: DC | PRN

## 2018-03-27 MED ORDER — ESCITALOPRAM OXALATE 20 MG PO TABS
20.0000 mg | ORAL_TABLET | Freq: Every day | ORAL | Status: DC
  Administered 2018-03-27: 20 mg via ORAL
  Filled 2018-03-27: qty 1

## 2018-03-27 MED ORDER — LACTATED RINGERS IV SOLN
INTRAVENOUS | Status: DC | PRN
  Administered 2018-03-27: 07:00:00 via INTRAVENOUS

## 2018-03-27 MED ORDER — LACTATED RINGERS IV SOLN
INTRAVENOUS | Status: DC | PRN
  Administered 2018-03-27: 08:00:00 via INTRAVENOUS

## 2018-03-27 MED ORDER — SODIUM CHLORIDE 0.9 % IV SOLN
INTRAVENOUS | Status: AC
  Filled 2018-03-27 (×3): qty 1.2

## 2018-03-27 MED ORDER — SIMVASTATIN 20 MG PO TABS
20.0000 mg | ORAL_TABLET | Freq: Every day | ORAL | Status: DC
  Administered 2018-03-27: 20 mg via ORAL
  Filled 2018-03-27: qty 1

## 2018-03-27 MED ORDER — ASPIRIN 81 MG PO CHEW
81.0000 mg | CHEWABLE_TABLET | Freq: Every day | ORAL | Status: DC
  Administered 2018-03-28: 81 mg via ORAL
  Filled 2018-03-27: qty 1

## 2018-03-27 MED ORDER — FENTANYL CITRATE (PF) 100 MCG/2ML IJ SOLN
INTRAMUSCULAR | Status: DC | PRN
  Administered 2018-03-27 (×2): 50 ug via INTRAVENOUS

## 2018-03-27 MED ORDER — CYCLOBENZAPRINE HCL 5 MG PO TABS: 5.0000 mg | ORAL_TABLET | Freq: Three times a day (TID) | ORAL | Status: DC

## 2018-03-27 MED ORDER — SODIUM CHLORIDE 0.9% FLUSH: 3.0000 mL | INTRAVENOUS | Status: DC | PRN

## 2018-03-27 MED ORDER — PROPOFOL 10 MG/ML IV BOLUS
INTRAVENOUS | Status: AC
  Filled 2018-03-27: qty 20

## 2018-03-27 MED ORDER — SODIUM CHLORIDE 0.9 % IV SOLN: 250.0000 mL | INTRAVENOUS | Status: DC | PRN

## 2018-03-27 MED ORDER — SODIUM CHLORIDE 0.9 % IV SOLN
INTRAVENOUS | Status: DC | PRN
  Administered 2018-03-27: 1500 mL

## 2018-03-27 MED ORDER — ONDANSETRON HCL 4 MG/2ML IJ SOLN
INTRAMUSCULAR | Status: DC | PRN
  Administered 2018-03-27: 4 mg via INTRAVENOUS

## 2018-03-27 MED ORDER — HEPARIN SODIUM (PORCINE) 1000 UNIT/ML IJ SOLN
INTRAMUSCULAR | Status: AC
  Filled 2018-03-27: qty 1

## 2018-03-27 MED ORDER — METHOTREXATE 2.5 MG PO TABS: 22.5000 mg | ORAL_TABLET | ORAL | Status: DC

## 2018-03-27 MED ORDER — CHLORHEXIDINE GLUCONATE 4 % EX LIQD: 30.0000 mL | CUTANEOUS | Status: DC

## 2018-03-27 MED ORDER — CLOPIDOGREL BISULFATE 75 MG PO TABS
75.0000 mg | ORAL_TABLET | Freq: Every day | ORAL | Status: DC
  Administered 2018-03-28: 75 mg via ORAL
  Filled 2018-03-27: qty 1

## 2018-03-27 MED ORDER — SODIUM CHLORIDE 0.9 % IV SOLN
1.5000 g | Freq: Two times a day (BID) | INTRAVENOUS | Status: DC
  Administered 2018-03-27 – 2018-03-28 (×2): 1.5 g via INTRAVENOUS
  Filled 2018-03-27 (×3): qty 1.5

## 2018-03-27 MED ORDER — SODIUM CHLORIDE 0.9% FLUSH
3.0000 mL | Freq: Two times a day (BID) | INTRAVENOUS | Status: DC
  Administered 2018-03-27 – 2018-03-28 (×2): 3 mL via INTRAVENOUS

## 2018-03-27 MED ORDER — SODIUM CHLORIDE 0.9 % IV SOLN
INTRAVENOUS | Status: AC
  Administered 2018-03-27: 50 mL/h via INTRAVENOUS

## 2018-03-27 MED ORDER — MORPHINE SULFATE (PF) 2 MG/ML IV SOLN: 2.0000 mg | INTRAVENOUS | Status: DC | PRN

## 2018-03-27 MED ORDER — IODIXANOL 320 MG/ML IV SOLN
INTRAVENOUS | Status: DC | PRN
  Administered 2018-03-27: 60.8 mL via INTRAVENOUS

## 2018-03-27 MED ORDER — MUPIROCIN 2 % EX OINT
1.0000 "application " | TOPICAL_OINTMENT | Freq: Two times a day (BID) | CUTANEOUS | Status: DC
  Administered 2018-03-27 – 2018-03-28 (×2): 1 via NASAL
  Filled 2018-03-27: qty 22

## 2018-03-27 MED ORDER — LINACLOTIDE 145 MCG PO CAPS: 145.0000 ug | ORAL_CAPSULE | Freq: Every day | ORAL | Status: DC

## 2018-03-27 MED ORDER — CHLORHEXIDINE GLUCONATE 4 % EX LIQD: 60.0000 mL | Freq: Once | CUTANEOUS | Status: DC

## 2018-03-27 MED ORDER — CHLORHEXIDINE GLUCONATE 0.12 % MT SOLN
15.0000 mL | Freq: Once | OROMUCOSAL | Status: DC
  Filled 2018-03-27: qty 15

## 2018-03-27 MED ORDER — HEPARIN SODIUM (PORCINE) 1000 UNIT/ML IJ SOLN
INTRAMUSCULAR | Status: DC | PRN
  Administered 2018-03-27: 7000 [IU] via INTRAVENOUS

## 2018-03-27 MED ORDER — LIDOCAINE HCL 1 % IJ SOLN
INTRAMUSCULAR | Status: DC | PRN
  Administered 2018-03-27: 9 mL

## 2018-03-27 MED ORDER — MORPHINE SULFATE (PF) 10 MG/ML IV SOLN: 2.0000 mg | INTRAVENOUS | Status: DC | PRN

## 2018-03-27 MED ORDER — ACETAMINOPHEN 650 MG RE SUPP: 650.0000 mg | Freq: Four times a day (QID) | RECTAL | Status: DC | PRN

## 2018-03-27 MED ORDER — VANCOMYCIN HCL IN DEXTROSE 1-5 GM/200ML-% IV SOLN
1000.0000 mg | Freq: Once | INTRAVENOUS | Status: AC
  Administered 2018-03-27: 1000 mg via INTRAVENOUS
  Filled 2018-03-27: qty 200

## 2018-03-27 MED ORDER — METOPROLOL TARTRATE 5 MG/5ML IV SOLN: 2.5000 mg | INTRAVENOUS | Status: DC | PRN

## 2018-03-27 MED ORDER — SODIUM CHLORIDE 0.9 % IV SOLN: INTRAVENOUS | Status: DC

## 2018-03-27 MED ORDER — CHLORHEXIDINE GLUCONATE CLOTH 2 % EX PADS
6.0000 | MEDICATED_PAD | Freq: Every day | CUTANEOUS | Status: DC
  Administered 2018-03-28: 6 via TOPICAL

## 2018-03-27 MED ORDER — SODIUM CHLORIDE 0.9 % IV SOLN
INTRAVENOUS | Status: DC | PRN
  Administered 2018-03-27: 25 ug/min via INTRAVENOUS

## 2018-03-27 MED ORDER — TRAMADOL HCL 50 MG PO TABS: 50.0000 mg | ORAL_TABLET | ORAL | Status: DC | PRN

## 2018-03-27 MED ORDER — LIDOCAINE HCL 1 % IJ SOLN
INTRAMUSCULAR | Status: AC
  Filled 2018-03-27: qty 20

## 2018-03-27 MED ORDER — ONDANSETRON HCL 4 MG/2ML IJ SOLN
INTRAMUSCULAR | Status: AC
  Filled 2018-03-27: qty 2

## 2018-03-27 MED ORDER — FENTANYL CITRATE (PF) 250 MCG/5ML IJ SOLN
INTRAMUSCULAR | Status: AC
  Filled 2018-03-27: qty 5

## 2018-03-27 MED ORDER — PROTAMINE SULFATE 10 MG/ML IV SOLN
INTRAVENOUS | Status: DC | PRN
  Administered 2018-03-27: 70 mg via INTRAVENOUS

## 2018-03-27 MED ORDER — NITROGLYCERIN IN D5W 200-5 MCG/ML-% IV SOLN: 0.0000 ug/min | INTRAVENOUS | Status: DC

## 2018-03-27 MED ORDER — FOLIC ACID 1 MG PO TABS: 1.0000 mg | ORAL_TABLET | Freq: Every day | ORAL | Status: DC

## 2018-03-27 MED ORDER — PROPOFOL 500 MG/50ML IV EMUL
INTRAVENOUS | Status: DC | PRN
  Administered 2018-03-27: 25 ug/kg/min via INTRAVENOUS

## 2018-03-27 MED ORDER — PHENYLEPHRINE 40 MCG/ML (10ML) SYRINGE FOR IV PUSH (FOR BLOOD PRESSURE SUPPORT)
PREFILLED_SYRINGE | INTRAVENOUS | Status: DC | PRN
  Administered 2018-03-27: 40 ug via INTRAVENOUS
  Administered 2018-03-27 (×3): 80 ug via INTRAVENOUS

## 2018-03-27 MED ORDER — DUTASTERIDE 0.5 MG PO CAPS
0.5000 mg | ORAL_CAPSULE | Freq: Every day | ORAL | Status: DC
  Administered 2018-03-27 – 2018-03-28 (×2): 0.5 mg via ORAL
  Filled 2018-03-27 (×2): qty 1

## 2018-03-27 MED ORDER — DOCUSATE SODIUM 100 MG PO CAPS
100.0000 mg | ORAL_CAPSULE | Freq: Two times a day (BID) | ORAL | Status: DC
  Administered 2018-03-27 – 2018-03-28 (×2): 100 mg via ORAL
  Filled 2018-03-27 (×2): qty 1

## 2018-03-27 MED ORDER — PROPOFOL 500 MG/50ML IV EMUL: INTRAVENOUS | Status: DC | PRN

## 2018-03-27 MED ORDER — MELATONIN 3 MG PO TABS
9.0000 mg | ORAL_TABLET | Freq: Every evening | ORAL | Status: DC | PRN
  Administered 2018-03-27: 9 mg via ORAL
  Filled 2018-03-27 (×2): qty 3

## 2018-03-27 MED ORDER — PROTAMINE SULFATE 10 MG/ML IV SOLN
INTRAVENOUS | Status: AC
  Filled 2018-03-27: qty 25

## 2018-03-27 SURGICAL SUPPLY — 94 items
BAG DECANTER FOR FLEXI CONT (MISCELLANEOUS) IMPLANT
BAG SNAP BAND KOVER 36X36 (MISCELLANEOUS) ×3 IMPLANT
BLADE CLIPPER SURG (BLADE) IMPLANT
BLADE OSCILLATING /SAGITTAL (BLADE) IMPLANT
BLADE STERNUM SYSTEM 6 (BLADE) IMPLANT
CABLE ADAPT CONN TEMP 6FT (ADAPTER) ×6 IMPLANT
CANNULA FEM VENOUS REMOTE 22FR (CANNULA) IMPLANT
CANNULA OPTISITE PERFUSION 16F (CANNULA) IMPLANT
CANNULA OPTISITE PERFUSION 18F (CANNULA) IMPLANT
CATH DIAG EXPO 6F VENT PIG 145 (CATHETERS) ×9 IMPLANT
CATH EXPO 5FR AL1 (CATHETERS) IMPLANT
CATH INFINITI 6F AL2 (CATHETERS) IMPLANT
CATH S G BIP PACING (SET/KITS/TRAYS/PACK) ×3 IMPLANT
CLIP VESOCCLUDE MED 24/CT (CLIP) IMPLANT
CLIP VESOCCLUDE SM WIDE 24/CT (CLIP) IMPLANT
CONT SPEC 4OZ CLIKSEAL STRL BL (MISCELLANEOUS) ×6 IMPLANT
COVER BACK TABLE 24X17X13 BIG (DRAPES) IMPLANT
COVER BACK TABLE 80X110 HD (DRAPES) ×3 IMPLANT
COVER DOME SNAP 22 D (MISCELLANEOUS) IMPLANT
COVER WAND RF STERILE (DRAPES) ×3 IMPLANT
CRADLE DONUT ADULT HEAD (MISCELLANEOUS) ×3 IMPLANT
DERMABOND ADVANCED (GAUZE/BANDAGES/DRESSINGS) ×1
DERMABOND ADVANCED .7 DNX12 (GAUZE/BANDAGES/DRESSINGS) ×2 IMPLANT
DEVICE CLOSURE PERCLS PRGLD 6F (VASCULAR PRODUCTS) ×4 IMPLANT
DRAPE INCISE IOBAN 66X45 STRL (DRAPES) IMPLANT
DRSG TEGADERM 4X4.75 (GAUZE/BANDAGES/DRESSINGS) ×6 IMPLANT
ELECT CAUTERY BLADE 6.4 (BLADE) IMPLANT
ELECT REM PT RETURN 9FT ADLT (ELECTROSURGICAL) ×6
ELECTRODE REM PT RTRN 9FT ADLT (ELECTROSURGICAL) ×4 IMPLANT
FELT TEFLON 6X6 (MISCELLANEOUS) IMPLANT
FEMORAL VENOUS CANN RAP (CANNULA) IMPLANT
GAUZE SPONGE 4X4 12PLY STRL (GAUZE/BANDAGES/DRESSINGS) ×3 IMPLANT
GLOVE BIO SURGEON STRL SZ7.5 (GLOVE) ×3 IMPLANT
GLOVE BIO SURGEON STRL SZ8 (GLOVE) IMPLANT
GLOVE EUDERMIC 7 POWDERFREE (GLOVE) IMPLANT
GLOVE ORTHO TXT STRL SZ7.5 (GLOVE) IMPLANT
GOWN STRL REUS W/ TWL LRG LVL3 (GOWN DISPOSABLE) IMPLANT
GOWN STRL REUS W/ TWL XL LVL3 (GOWN DISPOSABLE) ×2 IMPLANT
GOWN STRL REUS W/TWL LRG LVL3 (GOWN DISPOSABLE)
GOWN STRL REUS W/TWL XL LVL3 (GOWN DISPOSABLE) ×1
GUIDEWIRE SAFE TJ AMPLATZ EXST (WIRE) ×6 IMPLANT
GUIDEWIRE STRAIGHT .035 260CM (WIRE) ×3 IMPLANT
INSERT FOGARTY SM (MISCELLANEOUS) IMPLANT
KIT BASIN OR (CUSTOM PROCEDURE TRAY) ×3 IMPLANT
KIT DILATOR VASC 18G NDL (KITS) IMPLANT
KIT HEART LEFT (KITS) ×6 IMPLANT
KIT SUCTION CATH 14FR (SUCTIONS) IMPLANT
KIT TURNOVER KIT B (KITS) ×3 IMPLANT
LOOP VESSEL MAXI BLUE (MISCELLANEOUS) IMPLANT
LOOP VESSEL MINI RED (MISCELLANEOUS) IMPLANT
NEEDLE 22X1 1/2 (OR ONLY) (NEEDLE) IMPLANT
NEEDLE PERC 18GX7CM (NEEDLE) ×3 IMPLANT
NS IRRIG 1000ML POUR BTL (IV SOLUTION) ×3 IMPLANT
PACK ENDOVASCULAR (PACKS) ×3 IMPLANT
PAD ARMBOARD 7.5X6 YLW CONV (MISCELLANEOUS) ×6 IMPLANT
PAD ELECT DEFIB RADIOL ZOLL (MISCELLANEOUS) ×3 IMPLANT
PENCIL BUTTON HOLSTER BLD 10FT (ELECTRODE) IMPLANT
PERCLOSE PROGLIDE 6F (VASCULAR PRODUCTS) ×6
SET MICROPUNCTURE 5F STIFF (MISCELLANEOUS) ×3 IMPLANT
SHEATH BRITE TIP 6FR 35CM (SHEATH) ×6 IMPLANT
SHEATH PINNACLE 6F 10CM (SHEATH) ×3 IMPLANT
SHEATH PINNACLE 8F 10CM (SHEATH) ×6 IMPLANT
SLEEVE REPOSITIONING LENGTH 30 (MISCELLANEOUS) ×6 IMPLANT
SPONGE LAP 4X18 RFD (DISPOSABLE) IMPLANT
STOPCOCK MORSE 400PSI 3WAY (MISCELLANEOUS) ×3 IMPLANT
SUT ETHIBOND X763 2 0 SH 1 (SUTURE) IMPLANT
SUT GORETEX CV 4 TH 22 36 (SUTURE) IMPLANT
SUT GORETEX CV4 TH-18 (SUTURE) IMPLANT
SUT MNCRL AB 3-0 PS2 18 (SUTURE) IMPLANT
SUT PROLENE 5 0 C 1 36 (SUTURE) IMPLANT
SUT PROLENE 6 0 C 1 30 (SUTURE) IMPLANT
SUT SILK  1 MH (SUTURE) ×1
SUT SILK 1 MH (SUTURE) ×2 IMPLANT
SUT VIC AB 2-0 CT1 27 (SUTURE)
SUT VIC AB 2-0 CT1 TAPERPNT 27 (SUTURE) IMPLANT
SUT VIC AB 2-0 CTX 36 (SUTURE) IMPLANT
SUT VIC AB 3-0 SH 8-18 (SUTURE) IMPLANT
SYR 3ML LL SCALE MARK (SYRINGE) ×3 IMPLANT
SYR 50ML LL SCALE MARK (SYRINGE) ×3 IMPLANT
SYR BULB IRRIGATION 50ML (SYRINGE) IMPLANT
SYR CONTROL 10ML LL (SYRINGE) IMPLANT
TAPE CLOTH SURG 4X10 WHT LF (GAUZE/BANDAGES/DRESSINGS) ×3 IMPLANT
TOWEL GREEN STERILE (TOWEL DISPOSABLE) ×6 IMPLANT
TRANSDUCER DISP STR W/STOPCOCK (MISCELLANEOUS) ×3 IMPLANT
TRANSDUCER W/STOPCOCK (MISCELLANEOUS) ×6 IMPLANT
TRAY FOLEY SLVR 16FR TEMP STAT (SET/KITS/TRAYS/PACK) IMPLANT
TUBING ART PRESS 72  MALE/FEM (TUBING) ×2
TUBING ART PRESS 72 MALE/FEM (TUBING) ×4 IMPLANT
VALVE HEART TRANSCATH SZ3 23MM (Prosthesis & Implant Heart) ×3 IMPLANT
WIRE .035 3MM-J 145CM (WIRE) ×3 IMPLANT
WIRE AMPLATZ SS-J .035X180CM (WIRE) ×3 IMPLANT
WIRE EMERALD 3MM-J .035X150CM (WIRE) ×6 IMPLANT
WIRE EMERALD 3MM-J .035X260CM (WIRE) ×3 IMPLANT
WIRE EMERALD ST .035X260CM (WIRE) ×3 IMPLANT

## 2018-03-27 NOTE — Anesthesia Procedure Notes (Signed)
Central Venous Catheter Insertion Performed by: Roberts Gaudy, MD, anesthesiologist Start/End10/15/2019 6:50 AM, 03/27/2018 7:00 AM Patient location: Pre-op. Preanesthetic checklist: patient identified, IV checked, site marked, risks and benefits discussed, surgical consent, monitors and equipment checked, pre-op evaluation, timeout performed and anesthesia consent Lidocaine 1% used for infiltration and patient sedated Hand hygiene performed  and maximum sterile barriers used  Catheter size: 8 Fr Total catheter length 16. Central line was placed.Double lumen Procedure performed using ultrasound guided technique. Ultrasound Notes:image(s) printed for medical record Attempts: 1 Following insertion, dressing applied and line sutured. Post procedure assessment: blood return through all ports  Patient tolerated the procedure well with no immediate complications.

## 2018-03-27 NOTE — Anesthesia Postprocedure Evaluation (Signed)
Anesthesia Post Note  Patient: [name]  Procedure(s) Performed: TRANSCATHETER AORTIC VALVE REPLACEMENT, TRANSFEMORAL. Edwards SAPIEN 3 Transcatheter Heart Valve 28m. (N/A Chest) INTRAOPERATIVE TRANSTHORACIC ECHOCARDIOGRAM (N/A Chest)     Patient location during evaluation: Cath Lab Anesthesia Type: MAC Level of consciousness: awake and alert Pain management: pain level controlled Vital Signs Assessment: post-procedure vital signs reviewed and stable Respiratory status: spontaneous breathing, nonlabored ventilation, respiratory function stable and patient connected to nasal cannula oxygen Cardiovascular status: stable and blood pressure returned to baseline Postop Assessment: no apparent nausea or vomiting Anesthetic complications: no    Last Vitals:  Vitals:   03/27/18 1400 03/27/18 1500  BP: (!) 112/42 (!) 116/35  Pulse: (!) 50 (!) 52  Resp: 18 18  Temp:    SpO2: 99% 99%    Last Pain:  Vitals:   03/27/18 1439  TempSrc:   PainSc: 5                  Keiasia Christianson P Radhika Dershem

## 2018-03-27 NOTE — Progress Notes (Signed)
Patient arrived from cathlab to 4 east 20, with translator. Patient placed on monitor and vital signs obtained. CCMD made aware of patient. B Groins without hematoma or bleeding. Level 0. Patient with call bell with in reach. Will monitor patient. Kaylah Chiasson, Bettina Gavia RN

## 2018-03-27 NOTE — OR Nursing (Signed)
Patient arrived to cath lab holding room with pressure in 50'I sytolic. Before pulling arterial and venous sheaths from left groin, CRNA Almyra Free increased the Levophed to increase systolic BP  to 370. HR 40's. Pulled sheaths starting at 0945 and held pressure for 23 minutes. No hemotoma or bleeding present after hold. VS stable. Soft groin observed by Cath lab RN taking over his care.

## 2018-03-27 NOTE — Anesthesia Preprocedure Evaluation (Addendum)
Anesthesia Evaluation  Patient identified by MRN, date of birth, ID band Patient awake    Reviewed: Allergy & Precautions, NPO status , Patient's Chart, lab work & pertinent test results  Airway Mallampati: I  TM Distance: >3 FB Neck ROM: Full    Dental  (+) Edentulous Upper, Edentulous Lower   Pulmonary former smoker,    Pulmonary exam normal breath sounds clear to auscultation       Cardiovascular hypertension, Pt. on medications  Rhythm:Regular Rate:Normal + Systolic murmurs ECG: SB, rate 55  CATH Prox Cx lesion is 20% stenosed. Ost 1st Mrg lesion is 20% stenosed. Prox LAD lesion is 20% stenosed.   1. Mild non-obstructive CAD 2. Severe aortic stenosis (mean gradient 54.9 mmHg, peak to peak gradient 53 mmHg, AVA 0.60 cm2) 3. Mean wedge pressure 12 mmHg  ECHO: LVEF 65-70%, moderate LVH, normal wall motion, grade 1 DD, elevated LV filling pressure, severe aortic stenosis mean gradient of 43 mmHg and calculated AVA of 0.9 cm2.   Neuro/Psych PSYCHIATRIC DISORDERS Depression negative neurological ROS     GI/Hepatic Neg liver ROS, GERD  Medicated,  Endo/Other  negative endocrine ROS  Renal/GU negative Renal ROS     Musculoskeletal  (+) Arthritis , Rheumatoid disorders,    Abdominal   Peds  Hematology  (+) anemia , HLD   Anesthesia Other Findings Severe Aortic Stenosis  Reproductive/Obstetrics                            Anesthesia Physical Anesthesia Plan  ASA: IV  Anesthesia Plan: MAC   Post-op Pain Management:    Induction: Intravenous  PONV Risk Score and Plan: 1 and Treatment may vary due to age or medical condition  Airway Management Planned: Simple Face Mask  Additional Equipment: Arterial line, CVP and Ultrasound Guidance Line Placement  Intra-op Plan:   Post-operative Plan:   Informed Consent: I have reviewed the patients History and Physical, chart, labs and  discussed the procedure including the risks, benefits and alternatives for the proposed anesthesia with the patient or authorized representative who has indicated his/her understanding and acceptance.   Dental advisory given  Plan Discussed with: CRNA  Anesthesia Plan Comments:        Anesthesia Quick Evaluation

## 2018-03-27 NOTE — H&P (Signed)
Cardiology Admission History and Physical:   Patient ID: [name] MRN: [id number]; DOB: [birth year]   Admission date: 03/27/2018  Primary Care Provider: Lajean Manes, MD  Chief Complaint:  Shortness of breath  Patient Profile:   [name] is a 84 y.o. male with HTN, HLD, severe AS, RA here today for TAVR.   History of Present Illness:   84 yo male with HTN, HLD, RA, severe AS here today for TAVR. He has had recent worsened dyspnea. CT scans show suitable access from the femoral arteries. Echo with normal Lv function and severe AS. Normal renal function.    Past Medical History:  Diagnosis Date  . BPH (benign prostatic hypertrophy)   . Depression   . Diverticulosis 02/07/2018  . Diverticulosis of colon   . GERD (gastroesophageal reflux disease)   . Gilbert's syndrome   . History of kidney stones   . HOH (hard of hearing)    refuses to wear his Hearing Aid  . Hypertension   . Inguinal hernia    right  . Internal hemorrhoid, bleeding   . Normal coronary arteries    a. Cath 07/2010: normal coronaries. Felt to have noncardiac CP/SOB at that time possibly r/t anxiety.  . RA (rheumatoid arthritis) ([place])    bilateral hands/ wrist--  seronegative  . Severe aortic stenosis   . Thyroid nodule    noted 11-2009  . Wears dentures     Past Surgical History:  Procedure Laterality Date  . CARDIAC CATHETERIZATION  07-19-2010  dr Tressia Miners turner   normal coronary arteries,  ef 60%,  moderate aortic stenosis- gradient 60mmHg, normal right heart pressure  . CARDIOVASCULAR STRESS TEST  05/ 2011   dr Marlou Porch   normal low risk perfusion study  . CATARACT EXTRACTION W/ INTRAOCULAR LENS  IMPLANT, BILATERAL  2013  . CATARACT EXTRACTION, BILATERAL    . COLONOSCOPY  2010  approx  . RIGHT/LEFT HEART CATH AND CORONARY ANGIOGRAPHY N/A 02/06/2018   Procedure: RIGHT/LEFT HEART CATH AND CORONARY ANGIOGRAPHY;  Surgeon: Burnell Blanks, MD;  Location: [facility];  Service:  Cardiovascular;  Laterality: N/A;  . THYROID LOBECTOMY Right 05/05/2015   Procedure: RIGHT THYROID LOBECTOMY;  Surgeon: Armandina Gemma, MD;  Location: [facility];  Service: General;  Laterality: Right;  . TRANSANAL HEMORRHOIDAL DEARTERIALIZATION N/A 04/09/2014   Procedure: TRANSANAL HEMORRHOIDAL DEARTERIALIZATION OF INTERNAL HEMORROIDS;  Surgeon: Leighton Ruff, MD;  Location: [facility];  Service: General;  Laterality: N/A;  . TRANSTHORACIC ECHOCARDIOGRAM  11-26-2013   moderate focal basal and mild LVH/  ef 54-62%/  grade I diastolic dysfunction/ mild LAE/  moderate calcification with stenosis AV with mild regurg,  gradients 35 abd 58 mmHg /  mild TR     Medications Prior to Admission: Prior to Admission medications   Medication Sig Start Date End Date Taking? Authorizing Provider  Ascorbic Acid (VITAMIN C) 1000 MG tablet Take 1,000 mg by mouth daily after lunch.   Yes [provider]  dutasteride (AVODART) 0.5 MG capsule Take 0.5 mg by mouth daily.   Yes [provider]  escitalopram (LEXAPRO) 20 MG tablet Take 1 tablet (20 mg total) by mouth daily. Patient taking differently: Take 20 mg by mouth at bedtime.  09/06/16  Yes Angiulli, Lavon Paganini, PA-C  folic acid (FOLVITE) 1 MG tablet Take 1 mg by mouth daily after lunch.  01/27/15  Yes [provider]  furosemide (LASIX) 20 MG tablet Take 1 tablet (20 mg total) by mouth daily.  02/07/18 02/07/19 Yes Elodia Florence., MD  ibuprofen (ADVIL,MOTRIN) 800 MG tablet Take 800 mg by mouth daily. 12/18/17  Yes [provider]  losartan (COZAAR) 25 MG tablet Take 25 mg by mouth daily.  01/21/18  Yes [provider]  Melatonin 10 MG TABS Take 10 mg by mouth at bedtime as needed (for sleep).    Yes [provider]  methotrexate (RHEUMATREX) 2.5 MG tablet Take 22.5 mg by mouth every Sunday.  07/13/13  Yes [provider]  Omega-3 Fatty Acids (FISH OIL) 1000 MG CAPS Take 1,000 mg by mouth  daily.   Yes [provider]  simvastatin (ZOCOR) 20 MG tablet TAKE 1 TABLET BY MOUTH EVERY NIGHT AT BEDTIME Patient taking differently: Take 20 mg by mouth at bedtime.  05/06/14  Yes Jerline Pain, MD  vitamin E 400 UNIT capsule Take 400 Units by mouth daily after lunch.   Yes [provider]  cyclobenzaprine (FLEXERIL) 5 MG tablet Take 1 tablet (5 mg total) by mouth 3 (three) times daily. Patient not taking: Reported on 02/04/2018 09/06/16   Angiulli, Lavon Paganini, PA-C  docusate sodium (COLACE) 100 MG capsule Take 1 capsule (100 mg total) by mouth 2 (two) times daily. Patient not taking: Reported on 02/04/2018 09/06/16   Angiulli, Lavon Paganini, PA-C  linaclotide [facility]) 145 MCG CAPS capsule Take 1 capsule (145 mcg total) by mouth daily. Patient not taking: Reported on 02/04/2018 09/07/16   Angiulli, Lavon Paganini, PA-C  omeprazole (PRILOSEC) 20 MG capsule Take 1 capsule (20 mg total) by mouth daily. Patient not taking: Reported on 02/04/2018 09/06/16   Angiulli, Lavon Paganini, PA-C  polyethylene glycol [facility] / GLYCOLAX) packet Take 17 g by mouth daily. Patient not taking: Reported on 02/04/2018 09/07/16   Angiulli, Lavon Paganini, PA-C  traMADol (ULTRAM) 50 MG tablet Take 1 tablet (50 mg total) by mouth every 6 (six) hours. Patient not taking: Reported on 02/04/2018 09/06/16   Cathlyn Parsons, PA-C     Allergies:    Allergies  Allergen Reactions  . Ativan [Lorazepam] Other (See Comments)    hallucinations  . Phenergan [Promethazine Hcl] Hypertension  . Eggs Or Egg-Derived Products Rash    Small rash after eating for several days in a row    Social History:   Social History   Socioeconomic History  . Marital status: Widowed    Spouse name: Not on file  . Number of children: Not on file  . Years of education: Not on file  . Highest education level: Not on file  Occupational History  . Not on file  Social Needs  . Financial resource strain: Not on file  . Food insecurity:    Worry:  Not on file    Inability: Not on file  . Transportation needs:    Medical: Not on file    Non-medical: Not on file  Tobacco Use  . Smoking status: Former Smoker    Years: 40.00    Types: Cigarettes    Start date: 06/13/1973    Last attempt to quit: 04/03/1981    Years since quitting: 37.0  . Smokeless tobacco: Never Used  Substance and Sexual Activity  . Alcohol use: Yes    Comment: occasional  . Drug use: No  . Sexual activity: Not on file  Lifestyle  . Physical activity:    Days per week: Not on file    Minutes per session: Not on file  . Stress: Not on file  Relationships  .  Social connections:    Talks on phone: Not on file    Gets together: Not on file    Attends religious service: Not on file    Active member of club or organization: Not on file    Attends meetings of clubs or organizations: Not on file    Relationship status: Not on file  . Intimate partner violence:    Fear of current or ex partner: Not on file    Emotionally abused: Not on file    Physically abused: Not on file    Forced sexual activity: Not on file  Other Topics Concern  . Not on file  Social History Narrative  . Not on file    Family History:   The patient's family history includes Breast cancer in his sister; CVA in his father; Diabetes in his brother and brother; Pulmonary embolism (age of onset: 78) in his mother.    ROS:  Please see the history of present illness.  All other ROS reviewed and negative.     Physical Exam/Data:   Vitals:   03/27/18 0618  BP: (!) 165/60  Pulse: (!) 50  Resp: 20  Temp: (!) 97.5 F (36.4 C)  SpO2: 100%   No intake or output data in the 24 hours ending 03/27/18 0750 There were no vitals filed for this visit. There is no height or weight on file to calculate BMI.  General:  Well nourished, well developed, in no acute distress HEENT: normal Lymph: no adenopathy Neck: no JVD Endocrine:  No thryomegaly Vascular: No carotid bruits; FA pulses 2+  bilaterally without bruits  Cardiac:  normal S1, S2; RRR; loud systolic murmur.   Lungs:  clear to auscultation bilaterally, no wheezing, rhonchi or rales  Abd: soft, nontender, no hepatomegaly  Ext: no edema Musculoskeletal:  No deformities, BUE and BLE strength normal and equal Skin: warm and dry  Neuro:  CNs 2-12 intact, no focal abnormalities noted Psych:  Normal affect   Laboratory Data:  Chemistry Recent Labs  Lab 03/23/18 0952  NA 138  K 4.3  CL 106  CO2 23  GLUCOSE 151*  BUN 13  CREATININE 0.97  CALCIUM 8.9  GFRNONAA >60  GFRAA >60  ANIONGAP 9    Recent Labs  Lab 03/23/18 0952  PROT 7.0  ALBUMIN 4.0  AST 28  ALT 20  ALKPHOS 55  BILITOT 1.2   Hematology Recent Labs  Lab 03/23/18 0952  WBC 4.4  RBC 4.44  HGB 12.6*  HCT 41.7  MCV 93.9  MCH 28.4  MCHC 30.2  RDW 13.2  PLT 167   Cardiac EnzymesNo results for input(s): TROPONINI in the last 168 hours. No results for input(s): TROPIPOC in the last 168 hours.  BNP Recent Labs  Lab 03/23/18 0952  BNP 78.0    DDimer No results for input(s): DDIMER in the last 168 hours.  Radiology/Studies:  No results found.  Assessment and Plan:   1. Severe AS: Plans for TAVR today  Severity of Illness: The appropriate patient status for this patient is INPATIENT. Inpatient status is judged to be reasonable and necessary in order to provide the required intensity of service to ensure the patient's safety. The patient's presenting symptoms, physical exam findings, and initial radiographic and laboratory data in the context of their chronic comorbidities is felt to place them at high risk for further clinical deterioration. Furthermore, it is not anticipated that the patient will be medically stable for discharge from the hospital within 2  midnights of admission. The following factors support the patient status of inpatient.   " The patient's presenting symptoms include dyspnea. " The worrisome physical exam  findings include severe AS " The initial radiographic and laboratory data are worrisome because of  " The chronic co-morbidities include HTN, HLD, RA, AS.   * I certify that at the point of admission it is my clinical judgment that the patient will require inpatient hospital care spanning beyond 2 midnights from the point of admission due to high intensity of service, high risk for further deterioration and high frequency of surveillance required.*    For questions or updates, please contact [place] Please consult [email or web address] for contact info under        Signed, Lauree Chandler, MD  03/27/2018 7:50 AM

## 2018-03-27 NOTE — Interval H&P Note (Signed)
History and Physical Interval Note:  03/27/2018 7:57 AM  [name]  has presented today for surgery, with the diagnosis of Severe Aortic Stenosis  The various methods of treatment have been discussed with the patient and family. After consideration of risks, benefits and other options for treatment, the patient has consented to  Procedure(s): TRANSCATHETER AORTIC VALVE REPLACEMENT, TRANSFEMORAL (N/A) INTRAOPERATIVE TRANSTHORACIC ECHOCARDIOGRAM (N/A) as a surgical intervention .  The patient's history has been reviewed, patient examined, no change in status, stable for surgery.  I have reviewed the patient's chart and labs.  Questions were answered to the patient's satisfaction.     Lauree Chandler

## 2018-03-27 NOTE — Anesthesia Procedure Notes (Signed)
Arterial Line Insertion Start/End10/15/2019 6:53 AM, 03/27/2018 6:58 AM Performed by: Barrington Ellison, CRNA, CRNA  Preanesthetic checklist: patient identified Lidocaine 1% used for infiltration and patient sedated Left, radial was placed Catheter size: 20 G Hand hygiene performed  and maximum sterile barriers used  Allen's test indicative of satisfactory collateral circulation Attempts: 1 Procedure performed without using ultrasound guided technique. Following insertion, dressing applied and Biopatch. Post procedure assessment: normal  Patient tolerated the procedure well with no immediate complications.

## 2018-03-27 NOTE — Progress Notes (Signed)
Patient encouraged to get out of bed after bed rest completed. Patient declined at this time. Will monitor patient. Taletha Twiford, Bettina Gavia RN

## 2018-03-27 NOTE — CV Procedure (Addendum)
[facility]  TAVR OPERATIVE NOTE   Date of Procedure:  03/27/2018  Preoperative Diagnosis: Severe Aortic Stenosis   Postoperative Diagnosis: Same   Procedure:    Transcatheter Aortic Valve Replacement - Transfemoral Approach  Edwards Sapien 3 THV (size 23 mm, model # F048547, serial # [id number])   Co-Surgeons:  Lauree Chandler, MD and Valentina Gu. Roxy Manns, MD   Anesthesiologist:  Roanna Banning  Echocardiographer:  Meda Coffee  Pre-operative Echo Findings:  Severe aortic stenosis  Normal left ventricular systolic function  Post-operative Echo Findings:  Mild paravalvular leak  Normal left ventricular systolic function  BRIEF CLINICAL NOTE AND INDICATIONS FOR SURGERY  84 yo male with severe AS, HTN, HLD, RA with recent worsening of dyspnea and found to have progression of his aortic valve stenosis.   During the course of the patient's preoperative work up they have been evaluated comprehensively by a multidisciplinary team of specialists coordinated through the [facility] in the [facility].  They have been demonstrated to suffer from symptomatic severe aortic stenosis as noted above. The patient has been counseled extensively as to the relative risks and benefits of all options for the treatment of severe aortic stenosis including long term medical therapy, conventional surgery for aortic valve replacement, and transcatheter aortic valve replacement.  The patient has been independently evaluated by our CT surgery team, Dr Roxy Manns, and they are felt to be at high risk for conventional surgical aortic valve replacement. Both surgeons indicated the patient would be a poor candidate for conventional surgery. Based upon review of all of the patient's preoperative diagnostic tests they are felt to be candidate for transcatheter aortic valve replacement using the transfemoral approach as an alternative to high risk conventional  surgery.    Following the decision to proceed with transcatheter aortic valve replacement, a discussion has been held regarding what types of management strategies would be attempted intraoperatively in the event of life-threatening complications, including whether or not the patient would be considered a candidate for the use of cardiopulmonary bypass and/or conversion to open sternotomy for attempted surgical intervention.  The patient has been advised of a variety of complications that might develop peculiar to this approach including but not limited to risks of death, stroke, paravalvular leak, aortic dissection or other major vascular complications, aortic annulus rupture, device embolization, cardiac rupture or perforation, acute myocardial infarction, arrhythmia, heart block or bradycardia requiring permanent pacemaker placement, congestive heart failure, respiratory failure, renal failure, pneumonia, infection, other late complications related to structural valve deterioration or migration, or other complications that might ultimately cause a temporary or permanent loss of functional independence or other long term morbidity.  The patient provides full informed consent for the procedure as described and all questions were answered preoperatively.    DETAILS OF THE OPERATIVE PROCEDURE  PREPARATION:   The patient is brought to the operating room on the above mentioned date and central monitoring was established by the anesthesia team including placement of a radial arterial line. The patient is placed in the supine position on the operating table.  Intravenous antibiotics are administered. Conscious sedation is used.   Baseline transthoracic echo was performed. The patient's chest, abdomen, both groins, and both lower extremities are prepared and draped in a sterile manner. A time out procedure is performed.   PERIPHERAL ACCESS:   Using the modified Seldinger technique, femoral arterial and  venous access were obtained with placement of 6 Fr sheaths on the left side.  A pigtail diagnostic catheter was passed through the femoral arterial sheath under fluoroscopic guidance into the aortic root.  A temporary transvenous pacemaker catheter was passed through the femoral venous sheath under fluoroscopic guidance into the right ventricle.  The pacemaker was tested to ensure stable lead placement and pacemaker capture. Aortic root angiography was performed in order to determine the optimal angiographic angle for valve deployment.  TRANSFEMORAL ACCESS:  A micropuncture kit was used to gain access to the right femoral artery using u/s guidance. Position confirmed with angiography. Pre-closure with double ProGlide closure devices. The patient was heparinized systemically and ACT verified > 250 seconds.    A 14 Fr transfemoral E-sheath was introduced into the right femoral artery after progressively dilating over an Amplatz superstiff wire. An AL-2 catheter was used to direct a straight-tip exchange length wire across the native aortic valve into the left ventricle. This was exchanged out for a pigtail catheter and position was confirmed in the LV apex. Simultaneous LV and Ao pressures were recorded.  The pigtail catheter was then exchanged for an Amplatz Extra-stiff wire in the LV apex.   TRANSCATHETER HEART VALVE DEPLOYMENT:  An Edwards Sapien 3 THV (size 23 mm) was prepared and crimped per manufacturer's guidelines, and the proper orientation of the valve is confirmed on the [employer] delivery system. The valve was advanced through the introducer sheath using normal technique until in an appropriate position in the abdominal aorta beyond the sheath tip. The balloon was then retracted and using the fine-tuning wheel was centered on the valve. The valve was then advanced across the aortic arch using appropriate flexion of the catheter. The valve was carefully positioned across the aortic valve  annulus. The Commander catheter was retracted using normal technique. Once final position of the valve has been confirmed by angiographic assessment, the valve is deployed while temporarily holding ventilation and during rapid ventricular pacing to maintain systolic blood pressure < 50 mmHg and pulse pressure < 10 mmHg. The balloon inflation is held for >3 seconds after reaching full deployment volume. Once the balloon has fully deflated the balloon is retracted into the ascending aorta and valve function is assessed using TTE. There is felt to be mild paravalvular leak and no central aortic insufficiency.  The patient's hemodynamic recovery following valve deployment is good.  The deployment balloon and guidewire are both removed. Echo demostrated acceptable post-procedural gradients, stable mitral valve function, and mild AI.   PROCEDURE COMPLETION:  The sheath was then removed and closure devices were completed. Protamine was administered once femoral arterial repair was complete. The temporary pacemaker, pigtail catheters and femoral sheaths were removed with manual pressure used for hemostasis.   The patient tolerated the procedure well and is transported to the surgical intensive care in stable condition. There were no immediate intraoperative complications. All sponge instrument and needle counts are verified correct at completion of the operation.   No blood products were administered during the operation.  The patient received a total of 60.8 mL of intravenous contrast during the procedure.  Lauree Chandler MD 03/27/2018 9:39 AM

## 2018-03-27 NOTE — Progress Notes (Addendum)
  [facility] VALVE TEAM  Patient doing well s/p TAVR. He is hemodynamically stable and has been weaned off levophed. Groin sites stable. ECG with sinus brady & 1st deg AV block, but no high grade block. Arterial line discontinued and transferred  to 4E. Plan for early ambulation after bedrest completed and hopeful discharge over the next 24-48 hours.   Angelena Form PA-C  [facility]  Pager [phone]

## 2018-03-27 NOTE — Transfer of Care (Signed)
Immediate Anesthesia Transfer of Care Note  Patient: [name]  Procedure(s) Performed: TRANSCATHETER AORTIC VALVE REPLACEMENT, TRANSFEMORAL. Edwards SAPIEN 3 Transcatheter Heart Valve 39m. (N/A Chest) INTRAOPERATIVE TRANSTHORACIC ECHOCARDIOGRAM (N/A Chest)  Patient Location: Cath Lab  Anesthesia Type:MAC  Level of Consciousness: awake and oriented  Airway & Oxygen Therapy: Patient Spontanous Breathing and Patient connected to nasal cannula oxygen  Post-op Assessment: Report given to RN  Post vital signs: Reviewed and stable  Last Vitals:  Vitals Value Taken Time  BP 113/30 03/27/2018  9:50 AM  Temp 36 C 03/27/2018  9:40 AM  Pulse 42 03/27/2018  9:50 AM  Resp 17 03/27/2018  9:50 AM  SpO2 100 % 03/27/2018  9:50 AM  Vitals shown include unvalidated device data.  Last Pain:  Vitals:   03/27/18 0612  PainSc: 0-No pain      Patients Stated Pain Goal: 2 ([id number]  Complications: No apparent anesthesia complications

## 2018-03-27 NOTE — Discharge Summary (Addendum)
[facility] VALVE TEAM  Discharge Summary    Patient ID: [name] MRN: [id number]; DOB: 01-21-1928  Admit date: 03/27/2018 Discharge date: 03/28/2018  Primary Care Provider: Lajean Manes, MD  Primary Cardiologist: Dr. Marlou Porch / Dr. Angelena Form & Dr. Roxy Manns (TAVR)  Discharge Diagnoses    Principal Problem:   S/P TAVR (transcatheter aortic valve replacement) Active Problems:   Severe aortic stenosis   Sinus bradycardia   Benign essential HTN   Diverticulosis   RA (rheumatoid arthritis) ([place])   HOH (hard of hearing)   GERD (gastroesophageal reflux disease)   Benign prostatic hyperplasia   HLD (hyperlipidemia)   Depression   Allergies Allergies  Allergen Reactions  . Ativan [Lorazepam] Other (See Comments)    hallucinations  . Phenergan [Promethazine Hcl] Hypertension  . Eggs Or Egg-Derived Products Rash    Small rash after eating for several days in a row    Diagnostic Studies/Procedures    TAVR OPERATIVE NOTE   Date of Procedure:                03/27/2018  Preoperative Diagnosis:      Severe Aortic Stenosis  Procedure:        Transcatheter Aortic Valve Replacement - Percutaneous Right Transfemoral Approach             Edwards Sapien 3 THV (size 23 mm, model # 9600TFX, serial # [id number])              Co-Surgeons:                        Valentina Gu. Roxy Manns, MD and  Lauree Chandler, MD   Pre-operative Echo Findings: ? Severe aortic stenosis ? Normal left ventricular systolic function  Post-operative Echo Findings: ? Mild paravalvular leak ? Normal left ventricular systolic function  __________________  Echo 03/28/18: formal read pending at the time of discharge    History of Present Illness     [name] is an 84 y.o. male with a history of HTN, HLD, chronic diastolic CHF, RA on MTX, sinus bradycardia, depression and severe aortic stenosis who presented to [facility] on 03/27/18 for planned  TAVR.  Patient originally moved to [place] in 1991. He has been remarkably physically active and healthy all of his life. He developed rheumatoid arthritis which primarily afflicts his hands. He has been treated chronically with methotrexate. He was found to have a heart murmur on exam and has been followed for several years with known history of aortic stenosis initially by Dr. Melvern Banker and more recently by Dr. Marlou Porch. His last follow-up visit was in September 2016 at which time echocardiogram revealed normal left ventricular function with moderate aortic stenosis.  In 2018 the patient was hospitalized for a prolonged period of time after a mechanical fall. Echocardiogram performed during that admission revealed aortic stenosis with mean transvalvular gradient estimated 37 mmHg. Patient ultimately recovered uneventfully and return to his normal active lifestyle.  The patient was recently hospitalized at [facility] with symptoms of shortness of breath, abdominal bloating, and some nausea and vomiting. Prior to presentation he took an extra blood pressure pill then developed discomfort across his chest and dizziness without syncope. He was hospitalized and evaluated comprehensively and found to have no significant acute problems with exception of the presence of severe aortic stenosis. Peak velocity across aortic valve was measured 4.5 m/s corresponding to mean transvalvular gradient estimated 43 mmHg. There was normal  left ventricular systolic function. The patient was referred to the multidisciplinary heart valve team and evaluated by Dr. Angelena Form. Left and right heart catheterization was performed February 06, 2018 notable for the presence of severe aortic stenosis. Mean transvalvular gradient was measured 54.86 mmHg corresponding to aortic valve area calculated 0.60 cm. Right heart pressures were normal. The patient was noted to have normal coronary artery anatomy with  mild nonobstructive coronary artery disease.   He was evaluated by the multidisciplinary valve team and ultimately felt to be a good candidate for TAVR, which was set up for 03/28/18.   Hospital Course     Consultants: none   Severe AS: s/p successful TAVR with a 23 mm Edwards Sapien 3 THV via the TF approach on 03/27/18. Post operative echo completed but pending formal read.  Dr. Angelena Form has reviewed it and feels PVL is stable with mean gradient of 16 to 18 mmHg, which is expected and 23 mm valve. Groin sites are stable. Initial ECG with sinus brady with first degree AV block, which has now resolved. Continue on ASA and plavix.  I will see him back in the office in 1 week for a transition of care visit  Chronic diastolic CHF: appears euvolemic at this time.  He will be resumed on home Lasix 20 mg daily.  HTN: BP well controlled.  Resumed on home losartan 25 mg daily.  HLD: continue statin  Rheumatoid arthritis: continued on MTX _____________  Discharge Vitals Blood pressure (!) 129/53, pulse 60, temperature 98.5 F (36.9 C), temperature source Oral, resp. rate (!) 22, weight 76.5 kg, SpO2 96 %.  Filed Weights   03/28/18 0345  Weight: 76.5 kg   VS:  BP (!) 129/53 (BP Location: Left Arm)   Pulse 60   Temp 98.5 F (36.9 C) (Oral)   Resp (!) 22   Wt 76.5 kg   SpO2 96%   BMI 27.22 kg/m    GEN: Well nourished, well developed, in no acute distress HEENT: normal Neck: no JVD or masses Cardiac: RRR; soft flow murmur. No rubs, or gallops,no edema  Respiratory:  clear to auscultation bilaterally, normal work of breathing GI: soft, nontender, nondistended, + BS MS: no deformity or atrophy Skin: warm and dry, no rash.  Groin site are clear without hematoma or ecchymosis Neuro:  Alert and Oriented x 3, Strength and sensation are intact Psych: euthymic mood, full affect  Labs & Radiologic Studies    CBC Recent Labs    03/27/18 0943 03/28/18 0434  WBC  --  6.3  HGB 11.6* 10.4*   HCT 34.0* 34.3*  MCV  --  93.0  PLT  --  409*   Basic Metabolic Panel Recent Labs    03/27/18 0943 03/28/18 0434  NA 141 138  K 4.3 4.0  CL 103 107  CO2  --  26  GLUCOSE 147* 145*  BUN 14 14  CREATININE 0.90 1.13  CALCIUM  --  8.5*  MG  --  2.0   Liver Function Tests No results for input(s): AST, ALT, ALKPHOS, BILITOT, PROT, ALBUMIN in the last 72 hours. No results for input(s): LIPASE, AMYLASE in the last 72 hours. Cardiac Enzymes No results for input(s): CKTOTAL, CKMB, CKMBINDEX, TROPONINI in the last 72 hours. BNP Invalid input(s): POCBNP D-Dimer No results for input(s): DDIMER in the last 72 hours. Hemoglobin A1C No results for input(s): HGBA1C in the last 72 hours. Fasting Lipid Panel No results for input(s): CHOL, HDL, LDLCALC, TRIG, CHOLHDL, LDLDIRECT  in the last 72 hours. Thyroid Function Tests No results for input(s): TSH, T4TOTAL, T3FREE, THYROIDAB in the last 72 hours.  Invalid input(s): FREET3 _____________  Dg Chest 2 View  Result Date: 03/23/2018 CLINICAL DATA:  Preoperative evaluation for upcoming TAVR EXAM: CHEST - 2 VIEW COMPARISON:  02/04/2018 FINDINGS: Cardiac shadow is stable. The lungs are well aerated bilaterally. Previously seen nodular density in the left lung is not as well visualized on today's exam but was present on CT examination from 02/07/2018. No focal infiltrate or effusion is seen. Mild degenerative changes of the thoracic spine are noted. IMPRESSION: Previously seen left upper lobe nodule is not well appreciated on today's exam. No acute abnormality noted. Electronically Signed   By: Inez Catalina M.D.   On: 03/23/2018 14:53   Dg Chest Port 1 View  Result Date: 03/27/2018 CLINICAL DATA:  Status post transcatheter aortic valve replacement. EXAM: PORTABLE CHEST 1 VIEW COMPARISON:  PA and lateral chest x-ray of March 23, 2018. FINDINGS: The lungs are well-expanded. The interstitial markings are coarse though stable. There is no alveolar  infiltrate or pleural effusion. There is no pulmonary vascular congestion. The prosthetic aortic valve cage appears to be appropriately positioned. There is calcification in the wall of the aortic arch. The right internal jugular venous catheter tip projects over the midportion of the SVC. The observed bony thorax is unremarkable. IMPRESSION: There is no postprocedure complication following transcatheter aortic valve replacement. Thoracic aortic atherosclerosis. Electronically Signed   By: David  Martinique M.D.   On: 03/27/2018 12:33   Disposition   Pt is being discharged home today in good condition.  Follow-up Plans & Appointments    Follow-up Information    Eileen Stanford, PA-C. Go on 04/04/2018.   Specialties:  Cardiology, Radiology Why:  @ 2:30pm. please arrive at least 10 minutes early Contact information: [address] [place] [id number] [phone]            Discharge Medications   Allergies as of 03/28/2018      Reactions   Ativan [lorazepam] Other (See Comments)   hallucinations   Phenergan [promethazine Hcl] Hypertension   Eggs Or Egg-derived Products Rash   Small rash after eating for several days in a row      Medication List    STOP taking these medications   ibuprofen 800 MG tablet Commonly known as:  ADVIL,MOTRIN   omeprazole 20 MG capsule Commonly known as:  PRILOSEC     TAKE these medications   aspirin 81 MG chewable tablet Chew 1 tablet (81 mg total) by mouth daily. Start taking on:  03/29/2018   clopidogrel 75 MG tablet Commonly known as:  PLAVIX Take 1 tablet (75 mg total) by mouth daily with breakfast. Start taking on:  03/29/2018   cyclobenzaprine 5 MG tablet Commonly known as:  FLEXERIL Take 1 tablet (5 mg total) by mouth 3 (three) times daily.   docusate sodium 100 MG capsule Commonly known as:  COLACE Take 1 capsule (100 mg total) by mouth 2 (two) times daily.   dutasteride 0.5 MG capsule Commonly known  as:  AVODART Take 0.5 mg by mouth daily.   escitalopram 20 MG tablet Commonly known as:  LEXAPRO Take 1 tablet (20 mg total) by mouth daily. What changed:  when to take this   Fish Oil 1000 MG Caps Take 1,000 mg by mouth daily.   folic acid 1 MG tablet Commonly known as:  FOLVITE Take 1 mg by  mouth daily after lunch.   furosemide 20 MG tablet Commonly known as:  LASIX Take 1 tablet (20 mg total) by mouth daily.   linaclotide 145 MCG Caps capsule Commonly known as:  LINZESS Take 1 capsule (145 mcg total) by mouth daily.   losartan 25 MG tablet Commonly known as:  COZAAR Take 25 mg by mouth daily.   Melatonin 10 MG Tabs Take 10 mg by mouth at bedtime as needed (for sleep).   methotrexate 2.5 MG tablet Commonly known as:  RHEUMATREX Take 22.5 mg by mouth every Sunday.   polyethylene glycol packet Commonly known as:  MIRALAX / GLYCOLAX Take 17 g by mouth daily.   simvastatin 20 MG tablet Commonly known as:  ZOCOR TAKE 1 TABLET BY MOUTH EVERY NIGHT AT BEDTIME   traMADol 50 MG tablet Commonly known as:  ULTRAM Take 1 tablet (50 mg total) by mouth every 6 (six) hours.   vitamin C 1000 MG tablet Take 1,000 mg by mouth daily after lunch.   vitamin E 400 UNIT capsule Take 400 Units by mouth daily after lunch.         Outstanding Labs/Studies   none  Duration of Discharge Encounter   Greater than 30 minutes including physician time.  SignedAngelena Form, PA-C 03/28/2018, 10:37 AM [phone]

## 2018-03-27 NOTE — Progress Notes (Signed)
Patient interviewed in the preop area. Hospital contracted interpreter, Rosalyn Charters, at the bedside and translated interview. Patient able to confirm name, DOB, procedure, npo status, metal in body (dental implants) and no pain at this time. Patient family at bedside.   Leatha Gilding, RN

## 2018-03-27 NOTE — Op Note (Signed)
HEART AND VASCULAR CENTER   MULTIDISCIPLINARY HEART VALVE TEAM   TAVR OPERATIVE NOTE   Date of Procedure:  03/27/2018  Preoperative Diagnosis: Severe Aortic Stenosis   Postoperative Diagnosis: Same   Procedure:    Transcatheter Aortic Valve Replacement - Percutaneous Right Transfemoral Approach  Edwards Sapien 3 THV (size 23 mm, model # 9600TFX, serial # [id number])   Co-Surgeons:  Valentina Gu. Roxy Manns, MD and  Lauree Chandler, MD  Anesthesiologist:  Adele Barthel, MD  Echocardiographer:  Ena Dawley, MD  Pre-operative Echo Findings:  Severe aortic stenosis  Normal left ventricular systolic function  Post-operative Echo Findings:  Mild paravalvular leak  Normal left ventricular systolic function   BRIEF CLINICAL NOTE AND INDICATIONS FOR SURGERY  Patient is an 84 year old male originally from [place] with history of aortic stenosis, hypertension, hyperlipidemia, and rheumatoid arthritis on methotrexate who has been referred for surgical consultation.  Patient originally moved to [place] in 1991.  He has been remarkably physically active and healthy all of his life.  He developed rheumatoid arthritis which primarily afflicts his hands.  He has been treated chronically with methotrexate.  He was found to have a heart murmur on exam and has been followed for several years with known history of aortic stenosis initially by Dr. Melvern Banker and more recently by Dr. Marlou Porch.  His last follow-up visit was in September 2016 at which time echocardiogram revealed normal left ventricular function with moderate aortic stenosis.  In 2018 the patient was hospitalized for a prolonged period of time after a mechanical fall.  Echocardiogram performed during that admission revealed aortic stenosis with mean transvalvular gradient estimated 37 mmHg.  Patient ultimately recovered uneventfully and return to his normal active lifestyle.  The patient was recently hospitalized at [facility] with symptoms of shortness of breath, abdominal bloating, and some nausea and vomiting.  Prior to presentation he took an extra blood pressure pill then developed discomfort across his chest and dizziness without syncope.  He was hospitalized and evaluated comprehensively and found to have no significant acute problems with exception of the presence of severe aortic stenosis.  Peak velocity across aortic valve was measured 4.5 m/s corresponding to mean transvalvular gradient estimated 43 mmHg.  There was normal left ventricular systolic function.  The patient was referred to the multidisciplinary heart valve team and evaluated by Dr. Angelena Form.  Left and right heart catheterization was performed February 06, 2018 notable for the presence of severe aortic stenosis.  Mean transvalvular gradient was measured 54.86 mmHg corresponding to aortic valve area calculated 0.60 cm.  Right heart pressures were normal.  The patient was noted to have normal coronary artery anatomy with mild nonobstructive coronary artery disease.  CT angiography was performed and the patient discharged home.  Cardiothoracic surgical consultation was requested.  During the course of the patient's preoperative work up they have been evaluated comprehensively by a multidisciplinary team of specialists coordinated through the [facility] in the [facility].  They have been demonstrated to suffer from symptomatic severe aortic stenosis as noted above. The patient has been counseled extensively as to the relative risks and benefits of all options for the treatment of severe aortic stenosis including long term medical therapy, conventional surgery for aortic valve replacement, and transcatheter aortic valve replacement.  All questions have been answered, and the patient provides full informed consent for the operation as described.   DETAILS OF THE OPERATIVE  PROCEDURE  PREPARATION:    The  patient is brought to the operating room on the above mentioned date and central monitoring was established by the anesthesia team including placement of a central venous line and radial arterial line. The patient is placed in the supine position on the operating table.  Intravenous antibiotics are administered. The patient is monitored closely throughout the procedure under conscious sedation.  Baseline transthoracic echocardiogram was performed. The patient's chest, abdomen, both groins, and both lower extremities are prepared and draped in a sterile manner. A time out procedure is performed.   PERIPHERAL ACCESS:    Using the modified Seldinger technique, femoral arterial and venous access was obtained with placement of 6 Fr sheaths on the left side.  A pigtail diagnostic catheter was passed through the left arterial sheath under fluoroscopic guidance into the aortic root.  A temporary transvenous pacemaker catheter was passed through the left femoral venous sheath under fluoroscopic guidance into the right ventricle.  The pacemaker was tested to ensure stable lead placement and pacemaker capture. Aortic root angiography was performed in order to determine the optimal angiographic angle for valve deployment.   TRANSFEMORAL ACCESS:   Percutaneous transfemoral access and sheath placement was performed using ultrasound guidance.  The right common femoral artery was cannulated using a micropuncture needle and appropriate location was verified using hand injection angiogram.  A pair of Abbott Perclose percutaneous closure devices were placed and a 6 French sheath replaced into the femoral artery.  The patient was heparinized systemically and ACT verified > 250 seconds.    A 14 Fr transfemoral E-sheath was introduced into the right common femoral artery after progressively dilating over an Amplatz superstiff wire. An AL-2 catheter was used to direct a straight-tip  exchange length wire across the native aortic valve into the left ventricle. This was exchanged out for a pigtail catheter and position was confirmed in the LV apex. Simultaneous LV and Ao pressures were recorded.  The pigtail catheter was exchanged for an Amplatz Extra-stiff wire in the LV apex.  Echocardiography was utilized to confirm appropriate wire position and no sign of entanglement in the mitral subvalvular apparatus.    TRANSCATHETER HEART VALVE DEPLOYMENT:   An Edwards Sapien 3 transcatheter heart valve (size 23 mm, model #9600TFX, serial #[id number]) was prepared and crimped per manufacturer's guidelines, and the proper orientation of the valve is confirmed on the [employer] delivery system. The valve was advanced through the introducer sheath using normal technique until in an appropriate position in the abdominal aorta beyond the sheath tip. The balloon was then retracted and using the fine-tuning wheel was centered on the valve. The valve was then advanced across the aortic arch using appropriate flexion of the catheter. The valve was carefully positioned across the aortic valve annulus. The Commander catheter was retracted using normal technique. Once final position of the valve has been confirmed by angiographic assessment, the valve is deployed while temporarily holding ventilation and during rapid ventricular pacing to maintain systolic blood pressure < 50 mmHg and pulse pressure < 10 mmHg. The balloon inflation is held for >3 seconds after reaching full deployment volume. Once the balloon has fully deflated the balloon is retracted into the ascending aorta and valve function is assessed using echocardiography. There is felt to be mild to moderate paravalvular leak and no central aortic insufficiency.  The patient's hemodynamic recovery following valve deployment is good.  Subsequently 1 mL of saline is added to the balloon and post-deployment balloon dilatation is performed under rapid  pacing.  The  patient tolerated balloon valvuloplasty well and repeat echocardiography revealed improvement in the appearance of the paravalvular leak.  The deployment balloon and guidewire are both removed.    PROCEDURE COMPLETION:   The sheath was removed and femoral artery closure performed.  Protamine was administered once femoral arterial repair was complete. The temporary pacemaker, pigtail catheters and femoral sheaths were removed with manual pressure used for hemostasis.   The patient tolerated the procedure well and is transported to the surgical intensive care in stable condition. There were no immediate intraoperative complications. All sponge instrument and needle counts are verified correct at completion of the operation.   No blood products were administered during the operation.  The patient received a total of 60.6 mL of intravenous contrast during the procedure.   Rexene Alberts, MD 03/27/2018 9:32 AM

## 2018-03-28 ENCOUNTER — Other Ambulatory Visit: Payer: Self-pay | Admitting: Physician Assistant

## 2018-03-28 ENCOUNTER — Inpatient Hospital Stay (HOSPITAL_COMMUNITY): Payer: Medicare Other

## 2018-03-28 ENCOUNTER — Encounter (HOSPITAL_COMMUNITY): Payer: Self-pay | Admitting: Cardiovascular Disease

## 2018-03-28 ENCOUNTER — Telehealth: Payer: Self-pay | Admitting: Cardiology

## 2018-03-28 DIAGNOSIS — Z952 Presence of prosthetic heart valve: Secondary | ICD-10-CM

## 2018-03-28 DIAGNOSIS — I361 Nonrheumatic tricuspid (valve) insufficiency: Secondary | ICD-10-CM

## 2018-03-28 LAB — CBC
HCT: 34.3 % — ABNORMAL LOW (ref 39.0–52.0)
Hemoglobin: 10.4 g/dL — ABNORMAL LOW (ref 13.0–17.0)
MCH: 28.2 pg (ref 26.0–34.0)
MCHC: 30.3 g/dL (ref 30.0–36.0)
MCV: 93 fL (ref 80.0–100.0)
Platelets: 103 10*3/uL — ABNORMAL LOW (ref 150–400)
RBC: 3.69 MIL/uL — ABNORMAL LOW (ref 4.22–5.81)
RDW: 13.4 % (ref 11.5–15.5)
WBC: 6.3 10*3/uL (ref 4.0–10.5)
nRBC: 0 % (ref 0.0–0.2)

## 2018-03-28 LAB — ECHOCARDIOGRAM COMPLETE: Weight: 2698.43 oz

## 2018-03-28 LAB — BASIC METABOLIC PANEL
Anion gap: 5 (ref 5–15)
BUN: 14 mg/dL (ref 8–23)
CO2: 26 mmol/L (ref 22–32)
CREATININE: 1.13 mg/dL (ref 0.61–1.24)
Calcium: 8.5 mg/dL — ABNORMAL LOW (ref 8.9–10.3)
Chloride: 107 mmol/L (ref 98–111)
GFR calc non Af Amer: 56 mL/min — ABNORMAL LOW (ref >60)
Glucose, Bld: 145 mg/dL — ABNORMAL HIGH (ref 70–99)
Potassium: 4 mmol/L (ref 3.5–5.1)
SODIUM: 138 mmol/L (ref 135–145)

## 2018-03-28 LAB — MAGNESIUM: Magnesium: 2 mg/dL (ref 1.7–2.4)

## 2018-03-28 MED ORDER — ASPIRIN 81 MG PO CHEW: 81.0000 mg | CHEWABLE_TABLET | Freq: Every day | ORAL | Status: DC

## 2018-03-28 MED ORDER — CLOPIDOGREL BISULFATE 75 MG PO TABS: 75.0000 mg | ORAL_TABLET | Freq: Every day | ORAL | 1 refills | Status: DC

## 2018-03-28 NOTE — Progress Notes (Signed)
Pt ambulated 840 feet this am , tolerated well, Denied any discomfort. Wanted to lay back in bed after walking, bilateral groin site level zero. bilateral pedal pulses +2. Call light within reach. Will continue to monitor.

## 2018-03-28 NOTE — Progress Notes (Signed)
  Echocardiogram 2D Echocardiogram has been performed.  [name] 03/28/2018, 10:02 AM

## 2018-03-28 NOTE — Telephone Encounter (Signed)
Received outpatient page regarding patient having a 99.5 temp at home without chills, nausea or vomiting. Pt was discharged earlier today after TAVR procedure preformed 03/27/18. Spoke with daughter on the phone who was able to translate questions and answers for [place]. Pt states that the surgical site is without discharge, no acute pain or redness, however is sore. I have suggested that she give him Tylenol tonight and monitor for worsening symptoms. I have instructed her to take him to the ED if he becomes acutely worse, but will otherwise need to call the office tomorrow for further instruction. Upon chart review, the patient was discharged in stable condition today at approximately 12pm without s/s of infection or fever.   Kathyrn Drown NP-C [place] Pager: [phone]

## 2018-03-28 NOTE — Discharge Instructions (Signed)

## 2018-03-28 NOTE — Progress Notes (Signed)
1100-1130 Pt was offered interpreter. Pt agreed that daughter could interpret for him. Had daughter sign appropriate sheet and put on front of chart. Discussed ex ed which is walking instructions modified for his age, gave low sodium diets and discussed CRP 2. Referred to [facility] program. Graylon Good RN BSN 03/28/2018 11:35 AM

## 2018-03-28 NOTE — Progress Notes (Signed)
CARDIAC REHAB PHASE I   PRE:  Rate/Rhythm: 64 SR  BP:  Sitting: 124/55        SaO2: 98 RA  MODE:  Ambulation: 390 ft   POST:  Rate/Rhythm: 73 SR  BP:  Sitting: 137/50        SaO2: 100 RA  0945 - 1019  Pt ambulated 390 ft with assistance of gait belt and hand held. Tolerated well. Back to bed. Will follow up with Ed when interpreter is present.   Philis Kendall, MS 03/28/2018 10:14 AM

## 2018-03-29 ENCOUNTER — Telehealth: Payer: Self-pay | Admitting: Physician Assistant

## 2018-03-29 MED FILL — Heparin Sodium (Porcine) Inj 1000 Unit/ML: INTRAMUSCULAR | Qty: 30 | Status: AC

## 2018-03-29 MED FILL — Potassium Chloride Inj 2 mEq/ML: INTRAVENOUS | Qty: 40 | Status: AC

## 2018-03-29 MED FILL — Magnesium Sulfate Inj 50%: INTRAMUSCULAR | Qty: 10 | Status: AC

## 2018-03-29 NOTE — Telephone Encounter (Signed)
  HEART AND [place] AND VASCULAR CENTER   MULTIDISCIPLINARY HEART VALVE TEAM   Patient contacted regarding discharge from [facility] on 03/29/18  Patient understands to follow up with provider Nell Range on 10/23 at 230 at [place].  Patient understands discharge instructions? yes Patient understands medications and regiment? yes Patient understands to bring all medications to this visit? yes  Patient has been having issues with what sounds like urge incontinence.  He feels like he has to go to the bathroom and then wets himself before getting to the toilet.  This is very embarrassing for him.  He does have a history of BPH and takes Avodart.  Discussed with his daughter [name] who serves as his Optometrist.  The patient denies any burning with urination.  His TAVR surgery was done under conscious sedation so I doubt a Foley was used, but the patient is unsure.  The patient and daughter deny any seizure-like activity with incontinence or focal neurologic deficits.  I reassured them that this is likely a sequela of anesthesia and may get better.  If it continues to occur or worsen I have asked him to let me know at their appointment next week and I can refer them to outpatient neurology.  The daughter also asked me to fill out some airline form as his niece had to make a change in her airline ticket to be here for surgery.  I will take care of this in the office next week.  Angelena Form PA-C  [facility]

## 2018-03-29 NOTE — Telephone Encounter (Signed)
New message   Patient's daughter states that her father had TAVR on 03/28/18. Patient daughter states that he is having side effects from the surgery and she wants to discuss side effects. Patient's daughter states that he has involuntary urination.

## 2018-03-30 ENCOUNTER — Telehealth (HOSPITAL_COMMUNITY): Payer: Self-pay

## 2018-03-30 NOTE — Telephone Encounter (Signed)
Called interpreter services to contact patients daughter on behalf of patient in regards to Cardiac Rehab - Daughter stated she does not need an interpreter. Daughter stated pt is interested in the program. Explained scheduling process and she verbalized understanding. Will contact pt once f/u appt has been completed and Medicaid form has been returned completed.  Pts paperwork in f/u appt bin.

## 2018-03-30 NOTE — Telephone Encounter (Signed)
Patients insurance is active and benefits verified through Medicare A/B - No co-pay, no deductible, no out of pocket, no co-insurance, and no pre-authorization is required. Passport/reference [phone]  Patients insurance is active through [facility] - Passport/reference [phone] Will fax over [facility] Reimbursement form to Dr.Jordan to fill, sign, and complete.  Will make initial call to patient in regards to Cardiac Rehab. If interested, patient will need to complete follow up appt. Once completed, patient will be contacted for scheduling.

## 2018-04-03 ENCOUNTER — Ambulatory Visit (HOSPITAL_COMMUNITY)
Admission: RE | Admit: 2018-04-03 | Discharge: 2018-04-03 | Disposition: A | Discharge status: Home / Self Care | Payer: Medicare Other | Source: Ambulatory Visit | Attending: Physician Assistant | Admitting: Physician Assistant

## 2018-04-03 ENCOUNTER — Ambulatory Visit (INDEPENDENT_AMBULATORY_CARE_PROVIDER_SITE_OTHER): Payer: Medicare Other | Admitting: Physician Assistant

## 2018-04-03 ENCOUNTER — Telehealth: Payer: Self-pay | Admitting: Physician Assistant

## 2018-04-03 VITALS — BP 146/60 | Wt 168.1 lb

## 2018-04-03 DIAGNOSIS — I5032 Chronic diastolic (congestive) heart failure: Secondary | ICD-10-CM | POA: Diagnosis not present

## 2018-04-03 DIAGNOSIS — Z952 Presence of prosthetic heart valve: Secondary | ICD-10-CM

## 2018-04-03 DIAGNOSIS — S301XXA Contusion of abdominal wall, initial encounter: Secondary | ICD-10-CM

## 2018-04-03 DIAGNOSIS — I10 Essential (primary) hypertension: Secondary | ICD-10-CM

## 2018-04-03 DIAGNOSIS — E785 Hyperlipidemia, unspecified: Secondary | ICD-10-CM

## 2018-04-03 NOTE — Telephone Encounter (Signed)
New Message   Pt's daughter is calling because the pt is having some tenderness on his left side near his incision area and having some pain when he walks. Please call

## 2018-04-03 NOTE — Progress Notes (Deleted)
[facility]                                       Cardiology Office Note    Date:  04/03/2018   ID:  [name], DOB 1927-10-04, MRN [id number]  PCP:  Lajean Manes, MD  Cardiologist: Dr. Marlou Porch / Dr. Angelena Form & Dr. Roxy Manns (TAVR)  CC: TOC s/p TAVR, appt moved up due to new left groin pain  History of Present Illness:  [name] is a 84 y.o. male with a history of HTN, HLD, chronic diastolic CHF, RA on MTX,sinus bradycardia,depression and severe aortic stenosis s/p TAVR (03/27/18) who presents to clinic for follow up.   Patient originally moved to [place] in 1991. He has been remarkably physically active and healthy all of his life. He developed rheumatoid arthritis which primarily afflicts his hands. He has been treated chronically with methotrexate. He was found to have a heart murmur on exam and has been followed for several years with known history of aortic stenosis initially by Dr. Melvern Banker and more recently by Dr. Marlou Porch. His last follow-up visit was in September 2016 at which time echocardiogram revealed normal left ventricular function with moderate aortic stenosis.  In 2018 the patient was hospitalized for a prolonged period of time after a mechanical fall. Echocardiogram performed during that admission revealed aortic stenosis with mean transvalvular gradient estimated 37 mmHg. Patient ultimately recovered uneventfully and return to his normal active lifestyle.  The patient was recently hospitalized at [facility] with symptoms of shortness of breath, abdominal bloating, and some nausea and vomiting. Prior to presentation he took an extra blood pressure pill then developed discomfort across his chest and dizziness without syncope. He was hospitalized and evaluated comprehensively and found to have no significant acute problems with exception of the presence of severe aortic stenosis. Peak velocity  across aortic valve was measured 4.5 m/s corresponding to mean transvalvular gradient estimated 43 mmHg. There was normal left ventricular systolic function. The patient was referred to the multidisciplinary heart valve team and evaluated by Dr. Angelena Form. Left and right heart catheterization was performed February 06, 2018 notable for the presence of severe aortic stenosis. Mean transvalvular gradient was measured 54.86 mmHg corresponding to aortic valve area calculated 0.60 cm. Right heart pressures were normal. The patient was noted to have normal coronary artery anatomy with mild nonobstructive coronary artery disease.   He was evaluated by the multidisciplinary valve team and ultimately underwent TAVR successful TAVR with a 23 mm Edwards Sapien 3 THV via the TF approach on 03/27/18. Post op echo showed EF 60%, normally functioning TAVR with mean gradient of 15 mm Hg and no mention of PVL on formal echo report.   He was originally scheduled for a follow up visit tomorrow but daughter called in to report him having new, severe left groin pain and he was added onto my schedule today.    Past Medical History:  Diagnosis Date  . BPH (benign prostatic hypertrophy)   . Depression   . Diverticulosis of colon   . GERD (gastroesophageal reflux disease)   . Gilbert's syndrome   . History of kidney stones   . HLD (hyperlipidemia)   . HOH (hard of hearing)    refuses to wear his Hearing Aid  . Hypertension   . Inguinal hernia    right  .  Internal hemorrhoid   . Normal coronary arteries    a. Cath 07/2010: normal coronaries. Felt to have noncardiac CP/SOB at that time possibly r/t anxiety.  . RA (rheumatoid arthritis) ([place])    bilateral hands/ wrist--  seronegative  . S/P TAVR (transcatheter aortic valve replacement) 03/27/2018   23 mm Edwards Sapien 3 transcatheter heart valve placed via percutaneous right transfemoral approach   . Severe aortic stenosis   . Thyroid nodule    noted 11-2009    . Wears dentures     Past Surgical History:  Procedure Laterality Date  . CARDIAC CATHETERIZATION  07-19-2010  dr Tressia Miners turner   normal coronary arteries,  ef 60%,  moderate aortic stenosis- gradient 42mmHg, normal right heart pressure  . CARDIOVASCULAR STRESS TEST  05/ 2011   dr Marlou Porch   normal low risk perfusion study  . CATARACT EXTRACTION W/ INTRAOCULAR LENS  IMPLANT, BILATERAL  2013  . CATARACT EXTRACTION, BILATERAL    . COLONOSCOPY  2010  approx  . INTRAOPERATIVE TRANSTHORACIC ECHOCARDIOGRAM N/A 03/27/2018   Procedure: INTRAOPERATIVE TRANSTHORACIC ECHOCARDIOGRAM;  Surgeon: Burnell Blanks, MD;  Location: [place];  Service: Open Heart Surgery;  Laterality: N/A;  . RIGHT/LEFT HEART CATH AND CORONARY ANGIOGRAPHY N/A 02/06/2018   Procedure: RIGHT/LEFT HEART CATH AND CORONARY ANGIOGRAPHY;  Surgeon: Burnell Blanks, MD;  Location: [place] [facility];  Service: Cardiovascular;  Laterality: N/A;  . THYROID LOBECTOMY Right 05/05/2015   Procedure: RIGHT THYROID LOBECTOMY;  Surgeon: Armandina Gemma, MD;  Location: [place];  Service: General;  Laterality: Right;  . TRANSANAL HEMORRHOIDAL DEARTERIALIZATION N/A 04/09/2014   Procedure: TRANSANAL HEMORRHOIDAL DEARTERIALIZATION OF INTERNAL HEMORROIDS;  Surgeon: Leighton Ruff, MD;  Location: [facility];  Service: General;  Laterality: N/A;  . TRANSCATHETER AORTIC VALVE REPLACEMENT, TRANSFEMORAL  03/27/2018  . TRANSCATHETER AORTIC VALVE REPLACEMENT, TRANSFEMORAL N/A 03/27/2018   Procedure: TRANSCATHETER AORTIC VALVE REPLACEMENT, TRANSFEMORAL. Edwards SAPIEN 3 Transcatheter Heart Valve 31mm.;  Surgeon: Burnell Blanks, MD;  Location: [place];  Service: Open Heart Surgery;  Laterality: N/A;  . TRANSTHORACIC ECHOCARDIOGRAM  11-26-2013   moderate focal basal and mild LVH/  ef 40-10%/  grade I diastolic dysfunction/ mild LAE/  moderate calcification with stenosis AV with mild regurg,  gradients 35 abd 58 mmHg /  mild TR     Current Medications: Outpatient Medications Prior to Visit  Medication Sig Dispense Refill  . Ascorbic Acid (VITAMIN C) 1000 MG tablet Take 1,000 mg by mouth daily after lunch.    Marland Kitchen aspirin 81 MG chewable tablet Chew 1 tablet (81 mg total) by mouth daily.    . clopidogrel (PLAVIX) 75 MG tablet Take 1 tablet (75 mg total) by mouth daily with breakfast. 90 tablet 1  . cyclobenzaprine (FLEXERIL) 5 MG tablet Take 1 tablet (5 mg total) by mouth 3 (three) times daily. (Patient not taking: Reported on 02/04/2018) 90 tablet 0  . docusate sodium (COLACE) 100 MG capsule Take 1 capsule (100 mg total) by mouth 2 (two) times daily. (Patient not taking: Reported on 02/04/2018) 10 capsule 0  . dutasteride (AVODART) 0.5 MG capsule Take 0.5 mg by mouth daily.    Marland Kitchen escitalopram (LEXAPRO) 20 MG tablet Take 1 tablet (20 mg total) by mouth daily. (Patient taking differently: Take 20 mg by mouth at bedtime. ) 30 tablet 4  . folic acid (FOLVITE) 1 MG tablet Take 1 mg by mouth daily after lunch.   0  . furosemide (LASIX) 20 MG tablet Take 1 tablet (20  mg total) by mouth daily. 30 tablet 11  . linaclotide (LINZESS) 145 MCG CAPS capsule Take 1 capsule (145 mcg total) by mouth daily. (Patient not taking: Reported on 02/04/2018) 30 capsule 1  . losartan (COZAAR) 25 MG tablet Take 25 mg by mouth daily.   3  . Melatonin 10 MG TABS Take 10 mg by mouth at bedtime as needed (for sleep).     . methotrexate (RHEUMATREX) 2.5 MG tablet Take 22.5 mg by mouth every Sunday.     . Omega-3 Fatty Acids (FISH OIL) 1000 MG CAPS Take 1,000 mg by mouth daily.    . polyethylene glycol (MIRALAX / GLYCOLAX) packet Take 17 g by mouth daily. (Patient not taking: Reported on 02/04/2018) 14 each 0  . simvastatin (ZOCOR) 20 MG tablet TAKE 1 TABLET BY MOUTH EVERY NIGHT AT BEDTIME (Patient taking differently: Take 20 mg by mouth at bedtime. ) 30 tablet 0  . traMADol (ULTRAM) 50 MG tablet Take 1 tablet (50 mg total) by mouth every 6 (six) hours.  (Patient not taking: Reported on 02/04/2018) 120 tablet 0  . vitamin E 400 UNIT capsule Take 400 Units by mouth daily after lunch.     No facility-administered medications prior to visit.      Allergies:   Ativan [lorazepam]; Phenergan [promethazine hcl]; and Eggs or egg-derived products   Social History   Socioeconomic History  . Marital status: Widowed    Spouse name: Not on file  . Number of children: Not on file  . Years of education: Not on file  . Highest education level: Not on file  Occupational History  . Not on file  Social Needs  . Financial resource strain: Not on file  . Food insecurity:    Worry: Not on file    Inability: Not on file  . Transportation needs:    Medical: Not on file    Non-medical: Not on file  Tobacco Use  . Smoking status: Former Smoker    Years: 40.00    Types: Cigarettes    Start date: 06/13/1973    Last attempt to quit: 04/03/1981    Years since quitting: 37.0  . Smokeless tobacco: Never Used  Substance and Sexual Activity  . Alcohol use: Yes    Comment: occasional  . Drug use: No  . Sexual activity: Not on file  Lifestyle  . Physical activity:    Days per week: Not on file    Minutes per session: Not on file  . Stress: Not on file  Relationships  . Social connections:    Talks on phone: Not on file    Gets together: Not on file    Attends religious service: Not on file    Active member of club or organization: Not on file    Attends meetings of clubs or organizations: Not on file    Relationship status: Not on file  Other Topics Concern  . Not on file  Social History Narrative  . Not on file     Family History:  The patient's family history includes Breast cancer in his sister; CVA in his father; Diabetes in his brother and brother; Pulmonary embolism (age of onset: 31) in his mother.      *** ROS/PE    Wt Readings from Last 3 Encounters:  03/28/18 168 lb 10.4 oz (76.5 kg)  03/23/18 167 lb 8 oz (76 kg)  02/22/18 167  lb 15.9 oz (76.2 kg)      Studies/Labs Reviewed:  EKG:  EKG is*** ordered today.  The ekg ordered today demonstrates ***  Recent Labs: 02/04/2018: TSH 5.467 03/23/2018: ALT 20; B Natriuretic Peptide 78.0 03/28/2018: BUN 14; Creatinine, Ser 1.13; Hemoglobin 10.4; Magnesium 2.0; Platelets 103; Potassium 4.0; Sodium 138   Lipid Panel    Component Value Date/Time   CHOL 169 12/05/2013 0808   TRIG 136.0 12/05/2013 0808   HDL 51.80 12/05/2013 0808   CHOLHDL 3 12/05/2013 0808   VLDL 27.2 12/05/2013 0808   LDLCALC 90 12/05/2013 0808    Additional studies/ records that were reviewed today include:  TAVR OPERATIVE NOTE   Date of Procedure:03/27/2018  Preoperative Diagnosis:Severe Aortic Stenosis  Procedure:   Transcatheter Aortic Valve Replacement - PercutaneousRightTransfemoral Approach Edwards Sapien 3 THV (size 30mm, model # 9600TFX, serial # [id number])  Co-Surgeons:Clarence H. Roxy Manns, MD and Lauree Chandler, MD   Pre-operative Echo Findings: ? Severe aortic stenosis ? Normalleft ventricular systolic function  Post-operative Echo Findings: ? Mildparavalvular leak ? Normalleft ventricular systolic function  __________________  Echo 03/28/18: Study Conclusions - Left ventricle: The cavity size was normal. There was moderate   concentric hypertrophy. Systolic function was normal. The   estimated ejection fraction was in the range of 60% to 65%. There   was dynamic obstruction at rest, with a peak velocity of 200   cm/sec and a peak gradient of 16 mm Hg. Wall motion was normal;   there were no regional wall motion abnormalities. Doppler   parameters are consistent with abnormal left ventricular   relaxation (grade 1 diastolic dysfunction). Doppler parameters   are consistent with indeterminate ventricular filling pressure. - Aortic valve: A 61mm Edwards Sapien TAVR bioprosthesis was    present and well-seated. Transvalvular velocity was mildly   increased. Peak velocity (S): 288 cm/s. Mean gradient (S): 15 mm   Hg. Valve area (VTI): 2.17 cm^2. Valve area (Vmax): 1.94 cm^2.   Valve area (Vmean): 2.09 cm^2. - Mitral valve: Transvalvular velocity was within the normal range.   There was no evidence for stenosis. There was no regurgitation. - Left atrium: The atrium was moderately dilated. - Right ventricle: The cavity size was normal. Wall thickness was   normal. Systolic function was normal. - Tricuspid valve: There was mild regurgitation. - Pulmonary arteries: Systolic pressure was within the normal   range. - Pericardium, extracardiac: A trivial pericardial effusion was   identified.  ASSESSMENT & PLAN:   Severe AS s/p TAVR:  Groin pain:   Chronic diastolic CHF:   HTN:   HLD:   Rheumatoid arthritis:    Medication Adjustments/Labs and Tests Ordered: Current medicines are reviewed at length with the patient today.  Concerns regarding medicines are outlined above.  Medication changes, Labs and Tests ordered today are listed in the Patient Instructions below. There are no Patient Instructions on file for this visit.   Signed, Angelena Form, PA-C  04/03/2018 1:56 PM    [facility] [address], [place], [place]  [address] Phone: [phone]; Fax: [phone]

## 2018-04-03 NOTE — Progress Notes (Signed)
[facility]                                       Cardiology Office Note    Date:  04/03/2018   ID:  [name], DOB August 31, 1927, MRN [id number]  PCP:  Lajean Manes, MD  Cardiologist: Dr.Skains/ Dr. Angelena Form & Dr. Roxy Manns (TAVR)  CC: TOC s/p TAVR, appt moved up due to new R groin pain  History of Present Illness:  [name] is a 84 y.o. male with a history of HTN, HLD, chronic diastolic CHF, RA on MTX,sinus bradycardia,depression and severe aortic stenosis s/p TAVR (03/27/18) who presents to clinic for follow up.    Patient originally moved to [place] in 1991. He has been remarkably physically active and healthy all of his life. He developed rheumatoid arthritis which primarily afflicts his hands. He has been treated chronically with methotrexate. He was found to have a heart murmur on exam and has been followed for several years with known history of aortic stenosis initially by Dr. Melvern Banker and more recently by Dr. Marlou Porch. His last follow-up visit was in September 2016 at which time echocardiogram revealed normal left ventricular function with moderate aortic stenosis.  In 2018 the patient was hospitalized for a prolonged period of time after a mechanical fall. Echocardiogram performed during that admission revealed aortic stenosis with mean transvalvular gradient estimated 37 mmHg. Patient ultimately recovered uneventfully and return to his normal active lifestyle.  The patient was recently hospitalized at [facility] with symptoms of shortness of breath, abdominal bloating, and some nausea and vomiting. Prior to presentation he took an extra blood pressure pill then developed discomfort across his chest and dizziness without syncope. He was hospitalized and evaluated comprehensively and found to have no significant acute problems with exception of the presence of severe aortic stenosis. Peak velocity  across aortic valve was measured 4.5 m/s corresponding to mean transvalvular gradient estimated 43 mmHg. There was normal left ventricular systolic function. The patient was referred to the multidisciplinary heart valve team and evaluated by Dr. Angelena Form. Left and right heart catheterization was performed February 06, 2018 notable for the presence of severe aortic stenosis. Mean transvalvular gradient was measured 54.86 mmHg corresponding to aortic valve area calculated 0.60 cm. Right heart pressures were normal. The patient was noted to have normal coronary artery anatomy with mild nonobstructive coronary artery disease.   He wasevaluated by the multidisciplinary valve team andultimately underwent TAVR successful TAVR with a 23 mm Edwards Sapien 3 THV via the TF approach on 03/27/18. Post op echo showed EF 60%, normally functioning TAVR with mean gradient of 15 mm Hg and no mention of PVL on formal echo report.   He was originally scheduled for a follow up visit tomorrow but daughter called in to report him having new, severe right groin pain and he was added onto my schedule today. He has been walking everyday up to 45 minutes. He initially had some shortness of breath with exertion but this has largely improved. No chest pain. No LE edema, orthopnea or PND. Occasional dizziness but no syncope. About three days ago he noticed pain in his right groin and worsening hematoma. It is tender to palpation and with certain movements.     Past Medical History:  Diagnosis Date  . BPH (benign prostatic hypertrophy)   . Depression   .  Diverticulosis of colon   . GERD (gastroesophageal reflux disease)   . Gilbert's syndrome   . History of kidney stones   . HLD (hyperlipidemia)   . HOH (hard of hearing)    refuses to wear his Hearing Aid  . Hypertension   . Inguinal hernia    right  . Internal hemorrhoid   . Normal coronary arteries    a. Cath 07/2010: normal coronaries. Felt to have noncardiac  CP/SOB at that time possibly r/t anxiety.  . RA (rheumatoid arthritis) ([place])    bilateral hands/ wrist--  seronegative  . S/P TAVR (transcatheter aortic valve replacement) 03/27/2018   23 mm Edwards Sapien 3 transcatheter heart valve placed via percutaneous right transfemoral approach   . Severe aortic stenosis   . Thyroid nodule    noted 11-2009  . Wears dentures     Past Surgical History:  Procedure Laterality Date  . CARDIAC CATHETERIZATION  07-19-2010  dr Tressia Miners turner   normal coronary arteries,  ef 60%,  moderate aortic stenosis- gradient 24mmHg, normal right heart pressure  . CARDIOVASCULAR STRESS TEST  05/ 2011   dr Marlou Porch   normal low risk perfusion study  . CATARACT EXTRACTION W/ INTRAOCULAR LENS  IMPLANT, BILATERAL  2013  . CATARACT EXTRACTION, BILATERAL    . COLONOSCOPY  2010  approx  . INTRAOPERATIVE TRANSTHORACIC ECHOCARDIOGRAM N/A 03/27/2018   Procedure: INTRAOPERATIVE TRANSTHORACIC ECHOCARDIOGRAM;  Surgeon: Burnell Blanks, MD;  Location: [facility];  Service: Open Heart Surgery;  Laterality: N/A;  . RIGHT/LEFT HEART CATH AND CORONARY ANGIOGRAPHY N/A 02/06/2018   Procedure: RIGHT/LEFT HEART CATH AND CORONARY ANGIOGRAPHY;  Surgeon: Burnell Blanks, MD;  Location: [facility];  Service: Cardiovascular;  Laterality: N/A;  . THYROID LOBECTOMY Right 05/05/2015   Procedure: RIGHT THYROID LOBECTOMY;  Surgeon: Armandina Gemma, MD;  Location: [place];  Service: General;  Laterality: Right;  . TRANSANAL HEMORRHOIDAL DEARTERIALIZATION N/A 04/09/2014   Procedure: TRANSANAL HEMORRHOIDAL DEARTERIALIZATION OF INTERNAL HEMORROIDS;  Surgeon: Leighton Ruff, MD;  Location: [facility] [place];  Service: General;  Laterality: N/A;  . TRANSCATHETER AORTIC VALVE REPLACEMENT, TRANSFEMORAL  03/27/2018  . TRANSCATHETER AORTIC VALVE REPLACEMENT, TRANSFEMORAL N/A 03/27/2018   Procedure: TRANSCATHETER AORTIC VALVE REPLACEMENT, TRANSFEMORAL. Edwards SAPIEN 3 Transcatheter Heart  Valve 20mm.;  Surgeon: Burnell Blanks, MD;  Location: [place];  Service: Open Heart Surgery;  Laterality: N/A;  . TRANSTHORACIC ECHOCARDIOGRAM  11-26-2013   moderate focal basal and mild LVH/  ef 27-03%/  grade I diastolic dysfunction/ mild LAE/  moderate calcification with stenosis AV with mild regurg,  gradients 35 abd 58 mmHg /  mild TR    Current Medications: Outpatient Medications Prior to Visit  Medication Sig Dispense Refill  . Ascorbic Acid (VITAMIN C) 1000 MG tablet Take 1,000 mg by mouth daily after lunch.    Marland Kitchen aspirin 81 MG chewable tablet Chew 1 tablet (81 mg total) by mouth daily.    . clopidogrel (PLAVIX) 75 MG tablet Take 1 tablet (75 mg total) by mouth daily with breakfast. 90 tablet 1  . cyclobenzaprine (FLEXERIL) 5 MG tablet Take 1 tablet (5 mg total) by mouth 3 (three) times daily. 90 tablet 0  . docusate sodium (COLACE) 100 MG capsule Take 1 capsule (100 mg total) by mouth 2 (two) times daily. 10 capsule 0  . dutasteride (AVODART) 0.5 MG capsule Take 0.5 mg by mouth daily.    Marland Kitchen escitalopram (LEXAPRO) 20 MG tablet Take 1 tablet (20 mg total) by mouth daily. (  Patient taking differently: Take 20 mg by mouth at bedtime. ) 30 tablet 4  . folic acid (FOLVITE) 1 MG tablet Take 1 mg by mouth daily after lunch.   0  . furosemide (LASIX) 20 MG tablet Take 1 tablet (20 mg total) by mouth daily. 30 tablet 11  . linaclotide (LINZESS) 145 MCG CAPS capsule Take 1 capsule (145 mcg total) by mouth daily. 30 capsule 1  . losartan (COZAAR) 25 MG tablet Take 25 mg by mouth daily.   3  . Melatonin 10 MG TABS Take 10 mg by mouth at bedtime as needed (for sleep).     . methotrexate (RHEUMATREX) 2.5 MG tablet Take 22.5 mg by mouth every Sunday.     . Omega-3 Fatty Acids (FISH OIL) 1000 MG CAPS Take 1,000 mg by mouth daily.    . polyethylene glycol (MIRALAX / GLYCOLAX) packet Take 17 g by mouth daily. 14 each 0  . simvastatin (ZOCOR) 20 MG tablet TAKE 1 TABLET BY MOUTH EVERY NIGHT AT BEDTIME  (Patient taking differently: Take 20 mg by mouth at bedtime. ) 30 tablet 0  . traMADol (ULTRAM) 50 MG tablet Take 1 tablet (50 mg total) by mouth every 6 (six) hours. 120 tablet 0  . vitamin E 400 UNIT capsule Take 400 Units by mouth daily after lunch.     No facility-administered medications prior to visit.      Allergies:   Ativan [lorazepam]; Phenergan [promethazine hcl]; and Eggs or egg-derived products   Social History   Socioeconomic History  . Marital status: Widowed    Spouse name: Not on file  . Number of children: Not on file  . Years of education: Not on file  . Highest education level: Not on file  Occupational History  . Not on file  Social Needs  . Financial resource strain: Not on file  . Food insecurity:    Worry: Not on file    Inability: Not on file  . Transportation needs:    Medical: Not on file    Non-medical: Not on file  Tobacco Use  . Smoking status: Former Smoker    Years: 40.00    Types: Cigarettes    Start date: 06/13/1973    Last attempt to quit: 04/03/1981    Years since quitting: 37.0  . Smokeless tobacco: Never Used  Substance and Sexual Activity  . Alcohol use: Yes    Comment: occasional  . Drug use: No  . Sexual activity: Not on file  Lifestyle  . Physical activity:    Days per week: Not on file    Minutes per session: Not on file  . Stress: Not on file  Relationships  . Social connections:    Talks on phone: Not on file    Gets together: Not on file    Attends religious service: Not on file    Active member of club or organization: Not on file    Attends meetings of clubs or organizations: Not on file    Relationship status: Not on file  Other Topics Concern  . Not on file  Social History Narrative  . Not on file     Family History:  The patient'sfamily history includes Breast cancer in his sister; CVA in his father; Diabetes in his brother and brother; Pulmonary embolism (age of onset: 61) in his mother.      ROS:     Please see the history of present illness.    ROS All other systems  reviewed and are negative.   PHYSICAL EXAM:   VS:  BP (!) 146/60   Wt 168 lb 1.9 oz (76.3 kg)   BMI 27.14 kg/m    GEN: Well nourished, well developed, in no acute distress HEENT: normal Neck: no JVD or masses Cardiac: RRR; 3/6 systolic ejection murmur. No rubs, or gallops,no edema  Respiratory:  clear to auscultation bilaterally, normal work of breathing GI: soft, nontender, nondistended, + BS MS: no deformity or atrophy Skin: warm and dry, no rash Neuro:  Alert and Oriented x 3, Strength and sensation are intact Psych: euthymic mood, full affect   Wt Readings from Last 3 Encounters:  04/03/18 168 lb 1.9 oz (76.3 kg)  03/28/18 168 lb 10.4 oz (76.5 kg)  03/23/18 167 lb 8 oz (76 kg)      Studies/Labs Reviewed:   EKG:  EKG is ordered today.  The ekg ordered today demonstrates sinus brady with PACs HR 58   Recent Labs: 02/04/2018: TSH 5.467 03/23/2018: ALT 20; B Natriuretic Peptide 78.0 03/28/2018: BUN 14; Creatinine, Ser 1.13; Hemoglobin 10.4; Magnesium 2.0; Platelets 103; Potassium 4.0; Sodium 138   Lipid Panel    Component Value Date/Time   CHOL 169 12/05/2013 0808   TRIG 136.0 12/05/2013 0808   HDL 51.80 12/05/2013 0808   CHOLHDL 3 12/05/2013 0808   VLDL 27.2 12/05/2013 0808   LDLCALC 90 12/05/2013 0808    Additional studies/ records that were reviewed today include:  TAVR OPERATIVE NOTE   Date of Procedure:03/27/2018  Preoperative Diagnosis:Severe Aortic Stenosis  Procedure:   Transcatheter Aortic Valve Replacement - PercutaneousRightTransfemoral Approach Edwards Sapien 3 THV (size 11mm, model # 9600TFX, serial # [id number])  Co-Surgeons:Clarence H. Roxy Manns, MD and Lauree Chandler, MD   Pre-operative Echo Findings: ? Severe aortic stenosis ? Normalleft ventricular systolic function  Post-operative Echo  Findings: ? Mildparavalvular leak ? Normalleft ventricular systolic function  __________________  Echo 03/28/18: Study Conclusions - Left ventricle: The cavity size was normal. There was moderate concentric hypertrophy. Systolic function was normal. The estimated ejection fraction was in the range of 60% to 65%. There was dynamic obstruction at rest, with a peak velocity of 200 cm/sec and a peak gradient of 16 mm Hg. Wall motion was normal; there were no regional wall motion abnormalities. Doppler parameters are consistent with abnormal left ventricular relaxation (grade 1 diastolic dysfunction). Doppler parameters are consistent with indeterminate ventricular filling pressure. - Aortic valve: A 34mm Edwards Sapien TAVR bioprosthesis was present and well-seated. Transvalvular velocity was mildly increased. Peak velocity (S): 288 cm/s. Mean gradient (S): 15 mm Hg. Valve area (VTI): 2.17 cm^2. Valve area (Vmax): 1.94 cm^2. Valve area (Vmean): 2.09 cm^2. - Mitral valve: Transvalvular velocity was within the normal range. There was no evidence for stenosis. There was no regurgitation. - Left atrium: The atrium was moderately dilated. - Right ventricle: The cavity size was normal. Wall thickness was normal. Systolic function was normal. - Tricuspid valve: There was mild regurgitation. - Pulmonary arteries: Systolic pressure was within the normal range. - Pericardium, extracardiac: A trivial pericardial effusion was identified.   ASSESSMENT & PLAN:   Severe AS s/p TAVR: doing well but developed a new right groin hematoma that is firm and tender to palpation. No bruit heard on exam. ECG with no high grade block. Continue ASA and plavix. Plan to do right groin [place] to rule out active bleeding or pseudoaneurysm. Also STAT CBC   Right groin hematoma: see above. I will see him back next week for  close follow up of his groin. I have asked him to limit  activity and not drive until we have cleared his groin.   Chronic diastolic CHF: appears euvolemic. Continue lasix at current dosing.   HTN: BP mildly elevated. No changes made.  HLD: continue statin  Rheumatoid arthritis: continue MTX   Medication Adjustments/Labs and Tests Ordered: Current medicines are reviewed at length with the patient today.  Concerns regarding medicines are outlined above.  Medication changes, Labs and Tests ordered today are listed in the Patient Instructions below. Patient Instructions  Medication Instructions:  Your provider recommends that you continue on your current medications as directed. Please refer to the Current Medication list given to you today.    Labwork: STAT CBC today at [facility]   Testing/Procedures: You have an ultrasound of your groin TODAY at our [facility] office.   Follow-Up: Please keep your appointments on 11/13 for echocardiogram and office visit. Please arrive to our office by 12:30PM.       Signed, Angelena Form, PA-C  04/03/2018 4:50 PM    [address], [place], [place]  [address] Phone: [phone]; Fax: [phone]

## 2018-04-03 NOTE — Telephone Encounter (Signed)
Scheduled patient to come in today for evaluation with Nell Range, PA.

## 2018-04-03 NOTE — Patient Instructions (Signed)
Medication Instructions:  Your provider recommends that you continue on your current medications as directed. Please refer to the Current Medication list given to you today.    Labwork: STAT CBC today at [facility]   Testing/Procedures: You have an ultrasound of your groin TODAY at our [facility] office.   Follow-Up: Please keep your appointments on 11/13 for echocardiogram and office visit. Please arrive to our office by 12:30PM.

## 2018-04-04 ENCOUNTER — Ambulatory Visit: Payer: Medicare Other | Admitting: Physician Assistant

## 2018-04-04 DIAGNOSIS — N401 Enlarged prostate with lower urinary tract symptoms: Secondary | ICD-10-CM | POA: Diagnosis not present

## 2018-04-04 DIAGNOSIS — I350 Nonrheumatic aortic (valve) stenosis: Secondary | ICD-10-CM | POA: Diagnosis not present

## 2018-04-04 DIAGNOSIS — I110 Hypertensive heart disease with heart failure: Secondary | ICD-10-CM | POA: Diagnosis not present

## 2018-04-04 DIAGNOSIS — M069 Rheumatoid arthritis, unspecified: Secondary | ICD-10-CM | POA: Diagnosis not present

## 2018-04-04 DIAGNOSIS — I2510 Atherosclerotic heart disease of native coronary artery without angina pectoris: Secondary | ICD-10-CM | POA: Diagnosis not present

## 2018-04-04 DIAGNOSIS — I5031 Acute diastolic (congestive) heart failure: Secondary | ICD-10-CM | POA: Diagnosis not present

## 2018-04-04 LAB — CBC WITH DIFFERENTIAL/PLATELET
BASOS: 0 %
Basophils Absolute: 0 10*3/uL (ref 0.0–0.2)
EOS (ABSOLUTE): 0.3 10*3/uL (ref 0.0–0.4)
EOS: 5 %
HEMATOCRIT: 33.6 % — AB (ref 37.5–51.0)
Hemoglobin: 11.3 g/dL — ABNORMAL LOW (ref 13.0–17.7)
IMMATURE GRANS (ABS): 0 10*3/uL (ref 0.0–0.1)
IMMATURE GRANULOCYTES: 0 %
Lymphocytes Absolute: 1.3 10*3/uL (ref 0.7–3.1)
Lymphs: 25 %
MCH: 29.9 pg (ref 26.6–33.0)
MCHC: 33.6 g/dL (ref 31.5–35.7)
MCV: 89 fL (ref 79–97)
MONOS ABS: 0.4 10*3/uL (ref 0.1–0.9)
Monocytes: 7 %
NEUTROS ABS: 3.3 10*3/uL (ref 1.4–7.0)
Neutrophils: 63 %
Platelets: 150 10*3/uL (ref 150–450)
RBC: 3.78 x10E6/uL — ABNORMAL LOW (ref 4.14–5.80)
RDW: 12.5 % (ref 12.3–15.4)
WBC: 5.2 10*3/uL (ref 3.4–10.8)

## 2018-04-06 ENCOUNTER — Encounter: Payer: Self-pay | Admitting: Thoracic Surgery (Cardiothoracic Vascular Surgery)

## 2018-04-06 ENCOUNTER — Telehealth: Payer: Self-pay | Admitting: Physician Assistant

## 2018-04-06 NOTE — Telephone Encounter (Signed)
° °  Granddaughter Loma Messing calling to request letter to provide airline. She is currently in [place]. Please call [phone]

## 2018-04-11 ENCOUNTER — Encounter: Payer: Self-pay | Admitting: Physician Assistant

## 2018-04-11 ENCOUNTER — Ambulatory Visit (INDEPENDENT_AMBULATORY_CARE_PROVIDER_SITE_OTHER): Payer: Medicare Other | Admitting: Physician Assistant

## 2018-04-11 VITALS — BP 132/58 | HR 62 | Ht 66.0 in | Wt 169.0 lb

## 2018-04-11 DIAGNOSIS — I5032 Chronic diastolic (congestive) heart failure: Secondary | ICD-10-CM

## 2018-04-11 DIAGNOSIS — S301XXA Contusion of abdominal wall, initial encounter: Secondary | ICD-10-CM | POA: Diagnosis not present

## 2018-04-11 DIAGNOSIS — I10 Essential (primary) hypertension: Secondary | ICD-10-CM | POA: Diagnosis not present

## 2018-04-11 DIAGNOSIS — Z952 Presence of prosthetic heart valve: Secondary | ICD-10-CM

## 2018-04-11 NOTE — Progress Notes (Signed)
[facility] [place]                                       Cardiology Office Note    Date:  04/11/2018   ID:  [name], DOB [birth year], MRN [id number]  PCP:  Lajean Manes, MD  Cardiologist:  Dr.Skains/ Dr. Angelena Form & Dr. Roxy Manns (TAVR)  CC: groin check  History of Present Illness:  [name] is a 84 y.o. male with a history of HTN, HLD, chronic diastolic CHF, RA on MTX,sinus bradycardia,depression and severe aortic stenosiss/p TAVR (03/27/18) who presents to clinic for follow up.   He wasevaluated by the multidisciplinary valve team andultimatelyunderwent TAVRsuccessful TAVR with a 23 mm Edwards Sapien 3 THV via the TF approach on 03/27/18.Post op echo showed EF 60%, normally functioning TAVR with mean gradient of 15 mm Hg and no mention of PVL on formal echo report.   The patient was seen in clinic on 04/03/18 for groin pain and found to have a new hematoma. Follow up groin doppler was negative for active bleeding or pseudoaneurysm and CBC showed stable blood counts.   Today he presents to clinic for follow up. He is doing well. He still get some pain in his groin with certain movements. Eager to walk and drive again. No CP or SOB. No LE edema, orthopnea or PND. No dizziness or syncope. No blood in stool or urine. No palpitations. Was only taking plavix and no aspirin.     Past Medical History:  Diagnosis Date  . BPH (benign prostatic hypertrophy)   . Depression   . Diverticulosis of colon   . GERD (gastroesophageal reflux disease)   . Gilbert's syndrome   . History of kidney stones   . HLD (hyperlipidemia)   . HOH (hard of hearing)    refuses to wear his Hearing Aid  . Hypertension   . Inguinal hernia    right  . Internal hemorrhoid   . Normal coronary arteries    a. Cath 07/2010: normal coronaries. Felt to have noncardiac CP/SOB at that time possibly r/t anxiety.  . RA (rheumatoid arthritis) ([place])    bilateral  hands/ wrist--  seronegative  . S/P TAVR (transcatheter aortic valve replacement) 03/27/2018   23 mm Edwards Sapien 3 transcatheter heart valve placed via percutaneous right transfemoral approach   . Severe aortic stenosis   . Thyroid nodule    noted 11-2009  . Wears dentures     Past Surgical History:  Procedure Laterality Date  . CARDIAC CATHETERIZATION  07-19-2010  dr Tressia Miners turner   normal coronary arteries,  ef 60%,  moderate aortic stenosis- gradient 8mmHg, normal right heart pressure  . CARDIOVASCULAR STRESS TEST  05/ 2011   dr Marlou Porch   normal low risk perfusion study  . CATARACT EXTRACTION W/ INTRAOCULAR LENS  IMPLANT, BILATERAL  2013  . CATARACT EXTRACTION, BILATERAL    . COLONOSCOPY  2010  approx  . INTRAOPERATIVE TRANSTHORACIC ECHOCARDIOGRAM N/A 03/27/2018   Procedure: INTRAOPERATIVE TRANSTHORACIC ECHOCARDIOGRAM;  Surgeon: Burnell Blanks, MD;  Location: [facility];  Service: Open Heart Surgery;  Laterality: N/A;  . RIGHT/LEFT HEART CATH AND CORONARY ANGIOGRAPHY N/A 02/06/2018   Procedure: RIGHT/LEFT HEART CATH AND CORONARY ANGIOGRAPHY;  Surgeon: Burnell Blanks, MD;  Location: [facility];  Service: Cardiovascular;  Laterality: N/A;  . THYROID LOBECTOMY Right 05/05/2015  Procedure: RIGHT THYROID LOBECTOMY;  Surgeon: Armandina Gemma, MD;  Location: [facility];  Service: General;  Laterality: Right;  . TRANSANAL HEMORRHOIDAL DEARTERIALIZATION N/A 04/09/2014   Procedure: TRANSANAL HEMORRHOIDAL DEARTERIALIZATION OF INTERNAL HEMORROIDS;  Surgeon: Leighton Ruff, MD;  Location: [facility];  Service: General;  Laterality: N/A;  . TRANSCATHETER AORTIC VALVE REPLACEMENT, TRANSFEMORAL  03/27/2018  . TRANSCATHETER AORTIC VALVE REPLACEMENT, TRANSFEMORAL N/A 03/27/2018   Procedure: TRANSCATHETER AORTIC VALVE REPLACEMENT, TRANSFEMORAL. Edwards SAPIEN 3 Transcatheter Heart Valve 64mm.;  Surgeon: Burnell Blanks, MD;  Location: [place];  Service: Open Heart  Surgery;  Laterality: N/A;  . TRANSTHORACIC ECHOCARDIOGRAM  11-26-2013   moderate focal basal and mild LVH/  ef 43-32%/  grade I diastolic dysfunction/ mild LAE/  moderate calcification with stenosis AV with mild regurg,  gradients 35 abd 58 mmHg /  mild TR    Current Medications: Outpatient Medications Prior to Visit  Medication Sig Dispense Refill  . Ascorbic Acid (VITAMIN C) 1000 MG tablet Take 1,000 mg by mouth daily after lunch.    Marland Kitchen aspirin 81 MG tablet Take 81 mg by mouth daily.    . clopidogrel (PLAVIX) 75 MG tablet Take 1 tablet (75 mg total) by mouth daily with breakfast. 90 tablet 1  . cyclobenzaprine (FLEXERIL) 5 MG tablet Take 1 tablet (5 mg total) by mouth 3 (three) times daily. 90 tablet 0  . docusate sodium (COLACE) 100 MG capsule Take 1 capsule (100 mg total) by mouth 2 (two) times daily. 10 capsule 0  . dutasteride (AVODART) 0.5 MG capsule Take 0.5 mg by mouth daily.    Marland Kitchen escitalopram (LEXAPRO) 20 MG tablet Take 1 tablet (20 mg total) by mouth daily. 30 tablet 4  . folic acid (FOLVITE) 1 MG tablet Take 1 mg by mouth daily after lunch.   0  . furosemide (LASIX) 20 MG tablet Take 1 tablet (20 mg total) by mouth daily. 30 tablet 11  . linaclotide (LINZESS) 145 MCG CAPS capsule Take 1 capsule (145 mcg total) by mouth daily. 30 capsule 1  . losartan (COZAAR) 25 MG tablet Take 25 mg by mouth daily.   3  . Melatonin 10 MG TABS Take 10 mg by mouth at bedtime as needed (for sleep).     . methotrexate (RHEUMATREX) 2.5 MG tablet Take 22.5 mg by mouth every Sunday.     . Omega-3 Fatty Acids (FISH OIL) 1000 MG CAPS Take 1,000 mg by mouth daily.    . polyethylene glycol (MIRALAX / GLYCOLAX) packet Take 17 g by mouth daily. 14 each 0  . simvastatin (ZOCOR) 20 MG tablet TAKE 1 TABLET BY MOUTH EVERY NIGHT AT BEDTIME 30 tablet 0  . traMADol (ULTRAM) 50 MG tablet Take 1 tablet (50 mg total) by mouth every 6 (six) hours. 120 tablet 0  . vitamin E 400 UNIT capsule Take 400 Units by mouth daily  after lunch.    Marland Kitchen aspirin 81 MG chewable tablet Chew 1 tablet (81 mg total) by mouth daily. (Patient not taking: Reported on 04/11/2018)     No facility-administered medications prior to visit.      Allergies:   Ativan [lorazepam]; Phenergan [promethazine hcl]; and Eggs or egg-derived products   Social History   Socioeconomic History  . Marital status: Widowed    Spouse name: Not on file  . Number of children: Not on file  . Years of education: Not on file  . Highest education level: Not on file  Occupational History  .  Not on file  Social Needs  . Financial resource strain: Not on file  . Food insecurity:    Worry: Not on file    Inability: Not on file  . Transportation needs:    Medical: Not on file    Non-medical: Not on file  Tobacco Use  . Smoking status: Former Smoker    Years: 40.00    Types: Cigarettes    Start date: 06/13/1973    Last attempt to quit: 04/03/1981    Years since quitting: 37.0  . Smokeless tobacco: Never Used  Substance and Sexual Activity  . Alcohol use: Yes    Comment: occasional  . Drug use: No  . Sexual activity: Not on file  Lifestyle  . Physical activity:    Days per week: Not on file    Minutes per session: Not on file  . Stress: Not on file  Relationships  . Social connections:    Talks on phone: Not on file    Gets together: Not on file    Attends religious service: Not on file    Active member of club or organization: Not on file    Attends meetings of clubs or organizations: Not on file    Relationship status: Not on file  Other Topics Concern  . Not on file  Social History Narrative  . Not on file     Family History:  The patient's family history includes Breast cancer in his sister; CVA in his father; Diabetes in his brother and brother; Pulmonary embolism (age of onset: 75) in his mother.      ROS:   Please see the history of present illness.    ROS All other systems reviewed and are negative.   PHYSICAL EXAM:     VS:  BP (!) 132/58   Pulse 62   Ht 5\' 6"  (1.676 m)   Wt 169 lb (76.7 kg)   SpO2 98%   BMI 27.28 kg/m    GEN: Well nourished, well developed, in no acute distress HEENT: normal Neck: no JVD or masses Cardiac: RRR; 2/6 SEM. No rubs, or gallops,no edema  Respiratory:  clear to auscultation bilaterally, normal work of breathing GI: soft, nontender, nondistended, + BS MS: no deformity or atrophy Skin: warm and dry, no rash Neuro:  Alert and Oriented x 3, Strength and sensation are intact Psych: euthymic mood, full affect   Wt Readings from Last 3 Encounters:  04/11/18 169 lb (76.7 kg)  04/03/18 168 lb 1.9 oz (76.3 kg)  03/28/18 168 lb 10.4 oz (76.5 kg)      Studies/Labs Reviewed:   EKG:  EKG is NOT ordered today.    Recent Labs: 02/04/2018: TSH 5.467 03/23/2018: ALT 20; B Natriuretic Peptide 78.0 03/28/2018: BUN 14; Creatinine, Ser 1.13; Magnesium 2.0; Potassium 4.0; Sodium 138 04/03/2018: Hemoglobin 11.3; Platelets 150   Lipid Panel    Component Value Date/Time   CHOL 169 12/05/2013 0808   TRIG 136.0 12/05/2013 0808   HDL 51.80 12/05/2013 0808   CHOLHDL 3 12/05/2013 0808   VLDL 27.2 12/05/2013 0808   LDLCALC 90 12/05/2013 0808    Additional studies/ records that were reviewed today include:  TAVR OPERATIVE NOTE   Date of Procedure:03/27/2018  Preoperative Diagnosis:Severe Aortic Stenosis  Procedure:   Transcatheter Aortic Valve Replacement - PercutaneousRightTransfemoral Approach Edwards Sapien 3 THV (size 68mm, model # 9600TFX, serial # [id number])  Co-Surgeons:Clarence H. Roxy Manns, MD and Lauree Chandler, MD   Pre-operative Echo Findings: ? Severe aortic stenosis ?  Normalleft ventricular systolic function  Post-operative Echo Findings: ? Mildparavalvular leak ? Normalleft ventricular systolic function  __________________  Echo 03/28/18: Study Conclusions - Left  ventricle: The cavity size was normal. There was moderate concentric hypertrophy. Systolic function was normal. The estimated ejection fraction was in the range of 60% to 65%. There was dynamic obstruction at rest, with a peak velocity of 200 cm/sec and a peak gradient of 16 mm Hg. Wall motion was normal; there were no regional wall motion abnormalities. Doppler parameters are consistent with abnormal left ventricular relaxation (grade 1 diastolic dysfunction). Doppler parameters are consistent with indeterminate ventricular filling pressure. - Aortic valve: A 54mm Edwards Sapien TAVR bioprosthesis was present and well-seated. Transvalvular velocity was mildly increased. Peak velocity (S): 288 cm/s. Mean gradient (S): 15 mm Hg. Valve area (VTI): 2.17 cm^2. Valve area (Vmax): 1.94 cm^2. Valve area (Vmean): 2.09 cm^2. - Mitral valve: Transvalvular velocity was within the normal range. There was no evidence for stenosis. There was no regurgitation. - Left atrium: The atrium was moderately dilated. - Right ventricle: The cavity size was normal. Wall thickness was normal. Systolic function was normal. - Tricuspid valve: There was mild regurgitation. - Pulmonary arteries: Systolic pressure was within the normal range. - Pericardium, extracardiac: A trivial pericardial effusion was identified.  ______________  Groin duplex 04/03/18 Summary: No evidence of pseudoaneurysm, AVF or DVT. The right external iliac, common femoral, proximal superficial femoral and deep femoral arteries are all patent with normal flow.    ASSESSMENT & PLAN:   Severe AS s/p TAVR: doing well. Groin site healing, but hematoma still present. Dr. Marlou Porch looked at groin as well. Plan is to continue to monitor.  He is cleared to drive and start walking again. Continue ASA and Plavix ( he was only taking plavix, but now clear that he has to take both for 6 months). I will see him back mid  November for echo and 1 month follow up.    Chronic diastolic CHF: appears euvolemic. Continue current therapy.   Hematoma:  negative for active bleeding or pseudoaneurysm and CBC showed stable blood counts. His hematoma is still present but improved from last week.   HTN: BP well controlled today   Medication Adjustments/Labs and Tests Ordered: Current medicines are reviewed at length with the patient today.  Concerns regarding medicines are outlined above.  Medication changes, Labs and Tests ordered today are listed in the Patient Instructions below. Patient Instructions  Medication Instructions:  Your provider recommends that you continue on your current medications as directed. Please refer to the Current Medication list given to you today.    Labwork: None   Testing/Procedures: None  Follow-Up: Please keep your appointments for your ultrasound and visit with Nell Range, PA scheduled on 04/25/2018. [facility] arrive at [facility] by 12:45PM.  Any Other Special Instructions Will Be Listed Below (If Applicable).     If you need a refill on your cardiac medications before your next appointment, please call your pharmacy.      Signed, Angelena Form, PA-C  04/11/2018 2:08 PM    [facility] [address], [place], [place]  [address] Phone: [phone]; Fax: [phone]

## 2018-04-11 NOTE — Patient Instructions (Addendum)
Medication Instructions:  Your provider recommends that you continue on your current medications as directed. Please refer to the Current Medication list given to you today.    Labwork: None   Testing/Procedures: None  Follow-Up: Please keep your appointments for your ultrasound and visit with Nell Range, PA scheduled on 04/25/2018. Please arrive at [facility] by 12:45PM.  Any Other Special Instructions Will Be Listed Below (If Applicable).     If you need a refill on your cardiac medications before your next appointment, please call your pharmacy.

## 2018-04-13 ENCOUNTER — Telehealth (HOSPITAL_COMMUNITY): Payer: Self-pay

## 2018-04-13 NOTE — Telephone Encounter (Signed)
Called patient to see if he is interested in the Cardiac Rehab Program. Pt speaks [place], was able to talk to pt daughter [name]. [name] stated she will talk to her father 1st and give [place] a call Tuesday.   If no update from pt daughter will f/u in a week.

## 2018-04-17 ENCOUNTER — Telehealth: Payer: Self-pay | Admitting: Physician Assistant

## 2018-04-17 NOTE — Telephone Encounter (Signed)
This patient has had pain in his right groin since TAVR. We did a duplex of his groin which was negative for pseudoaneurysm and femoral artery and veins looked normal. He continues to have pain, especially when he bears down. Review of CT abdomen prior to TAVR showed a large right inguinal hernia with a short segment of ileum. I have asked him to make an appointment with his primary care office. If he has intractable pain he has been instructed to go to the ER to rule out incarcerated hernia. Discussed in detail with [name], his daughter who verbalized understanding.

## 2018-04-17 NOTE — Telephone Encounter (Signed)
Patient's daughter is calling stating he is having increased pain at his incision site. It hurts more when he sits down, especially  when he uses the bathroom.  He will not show the incision to his daughter, so she does not know if it is red or draining.  S/P TAVR

## 2018-04-20 ENCOUNTER — Telehealth (HOSPITAL_COMMUNITY): Payer: Self-pay

## 2018-04-20 DIAGNOSIS — Z0000 Encounter for general adult medical examination without abnormal findings: Secondary | ICD-10-CM | POA: Diagnosis not present

## 2018-04-20 DIAGNOSIS — I10 Essential (primary) hypertension: Secondary | ICD-10-CM | POA: Diagnosis not present

## 2018-04-20 DIAGNOSIS — M069 Rheumatoid arthritis, unspecified: Secondary | ICD-10-CM | POA: Diagnosis not present

## 2018-04-20 DIAGNOSIS — Z79899 Other long term (current) drug therapy: Secondary | ICD-10-CM | POA: Diagnosis not present

## 2018-04-20 DIAGNOSIS — F325 Major depressive disorder, single episode, in full remission: Secondary | ICD-10-CM | POA: Diagnosis not present

## 2018-04-20 DIAGNOSIS — E7800 Pure hypercholesterolemia, unspecified: Secondary | ICD-10-CM | POA: Diagnosis not present

## 2018-04-20 DIAGNOSIS — K4090 Unilateral inguinal hernia, without obstruction or gangrene, not specified as recurrent: Secondary | ICD-10-CM | POA: Diagnosis not present

## 2018-04-20 DIAGNOSIS — Z1389 Encounter for screening for other disorder: Secondary | ICD-10-CM | POA: Diagnosis not present

## 2018-04-20 NOTE — Telephone Encounter (Signed)
Called and spoke with daughter of patient in regards to patient participating in [place] stated that patient has developed a lot of pain that she believes is a hernia. Pt has appt today to get it checked out. Daughter asked that we follow up in 2-3 weeks.

## 2018-04-24 NOTE — Progress Notes (Addendum)
[facility] [place]                                       Cardiology Office Note    Date:  04/25/2018   ID:  [name], DOB 1928-03-26, MRN [id number]  PCP:  Lajean Manes, MD  Cardiologist: Dr.Skains/ Dr. Angelena Form & Dr. Roxy Manns (TAVR)  CC: 1 month s/p TAVR  History of Present Illness:  [name] is a 84 y.o. male with a history of HTN, HLD, chronic diastolic CHF, RA on MTX,sinus bradycardia,depression and severe aortic stenosiss/p TAVR (03/27/18) who presents to clinic for follow up.   The patient was hospitalized to [facility] in 02/04/18 with symptoms of shortness of breath, abdominal bloating, chest pain and n/v. He was hospitalized and evaluated comprehensively and found to have no significant acute problems with exception of the presence of severe aortic stenosis. Peak velocity across aortic valve was measured 4.5 m/s corresponding to mean transvalvular gradient estimated 43 mmHg. There was normal left ventricular systolic function. The patient was referred to the multidisciplinary heart valve team and evaluated by Dr. Angelena Form. [facility] 02/06/18 showed normal mild nonobstructive coronary artery disease.   Heultimatelyunderwent TAVRsuccessful TAVR with a 23 mm Edwards Sapien 3 THV via the TF approach on 03/27/18.Post op echo showed EF 60%, normally functioning TAVR with mean gradient of 15 mm Hg and no mention of PVL on formal echo report.   The patient was seen in clinic on 04/03/18 for R groin pain and found to have what was thought to be a new hematoma. Follow up groin doppler was negative for active bleeding or pseudoaneurysm and CBC showed stable blood counts. He continued to complain about this and review of pre TAVR CT scan showed a large right inguinal hernia with a short segment of ileum inside. He was referred back to his PCP.  Today he presents to clinic for follow up. He still has significant pain from his inguinal  hernia. He is going to see a Psychologist, sport and exercise soon about this. He knows he needs to call us before scheduling any surgery. No CP or SOB. No LE edema, orthopnea or PND. No dizziness or syncope. No blood in stool or urine. No palpitations. He feels much better since having his surgery.     Past Medical History:  Diagnosis Date  . BPH (benign prostatic hypertrophy)   . Depression   . Diverticulosis of colon   . GERD (gastroesophageal reflux disease)   . Gilbert's syndrome   . History of kidney stones   . HLD (hyperlipidemia)   . HOH (hard of hearing)    refuses to wear his Hearing Aid  . Hypertension   . Inguinal hernia    right  . Internal hemorrhoid   . Normal coronary arteries    a. Cath 07/2010: normal coronaries. Felt to have noncardiac CP/SOB at that time possibly r/t anxiety.  . RA (rheumatoid arthritis) ([place])    bilateral hands/ wrist--  seronegative  . S/P TAVR (transcatheter aortic valve replacement) 03/27/2018   23 mm Edwards Sapien 3 transcatheter heart valve placed via percutaneous right transfemoral approach   . Severe aortic stenosis   . Thyroid nodule    noted 11-2009  . Wears dentures     Past Surgical History:  Procedure Laterality Date  . CARDIAC CATHETERIZATION  07-19-2010  dr Tressia Miners turner   normal coronary  arteries,  ef 60%,  moderate aortic stenosis- gradient 17mmHg, normal right heart pressure  . CARDIOVASCULAR STRESS TEST  05/ 2011   dr Marlou Porch   normal low risk perfusion study  . CATARACT EXTRACTION W/ INTRAOCULAR LENS  IMPLANT, BILATERAL  2013  . CATARACT EXTRACTION, BILATERAL    . COLONOSCOPY  2010  approx  . INTRAOPERATIVE TRANSTHORACIC ECHOCARDIOGRAM N/A 03/27/2018   Procedure: INTRAOPERATIVE TRANSTHORACIC ECHOCARDIOGRAM;  Surgeon: Burnell Blanks, MD;  Location: [place];  Service: Open Heart Surgery;  Laterality: N/A;  . RIGHT/LEFT HEART CATH AND CORONARY ANGIOGRAPHY N/A 02/06/2018   Procedure: RIGHT/LEFT HEART CATH AND CORONARY ANGIOGRAPHY;  Surgeon:  Burnell Blanks, MD;  Location: [facility];  Service: Cardiovascular;  Laterality: N/A;  . THYROID LOBECTOMY Right 05/05/2015   Procedure: RIGHT THYROID LOBECTOMY;  Surgeon: Armandina Gemma, MD;  Location: [place];  Service: General;  Laterality: Right;  . TRANSANAL HEMORRHOIDAL DEARTERIALIZATION N/A 04/09/2014   Procedure: TRANSANAL HEMORRHOIDAL DEARTERIALIZATION OF INTERNAL HEMORROIDS;  Surgeon: Leighton Ruff, MD;  Location: [facility];  Service: General;  Laterality: N/A;  . TRANSCATHETER AORTIC VALVE REPLACEMENT, TRANSFEMORAL  03/27/2018  . TRANSCATHETER AORTIC VALVE REPLACEMENT, TRANSFEMORAL N/A 03/27/2018   Procedure: TRANSCATHETER AORTIC VALVE REPLACEMENT, TRANSFEMORAL. Edwards SAPIEN 3 Transcatheter Heart Valve 93mm.;  Surgeon: Burnell Blanks, MD;  Location: [place];  Service: Open Heart Surgery;  Laterality: N/A;  . TRANSTHORACIC ECHOCARDIOGRAM  11-26-2013   moderate focal basal and mild LVH/  ef 15-17%/  grade I diastolic dysfunction/ mild LAE/  moderate calcification with stenosis AV with mild regurg,  gradients 35 abd 58 mmHg /  mild TR    Current Medications: Outpatient Medications Prior to Visit  Medication Sig Dispense Refill  . Ascorbic Acid (VITAMIN C) 1000 MG tablet Take 1,000 mg by mouth daily after lunch.    Marland Kitchen aspirin 81 MG tablet Take 81 mg by mouth daily.    . clopidogrel (PLAVIX) 75 MG tablet Take 1 tablet (75 mg total) by mouth daily with breakfast. 90 tablet 1  . cyclobenzaprine (FLEXERIL) 5 MG tablet Take 1 tablet (5 mg total) by mouth 3 (three) times daily. 90 tablet 0  . docusate sodium (COLACE) 100 MG capsule Take 100 mg by mouth daily.    Marland Kitchen dutasteride (AVODART) 0.5 MG capsule Take 0.5 mg by mouth daily.    Marland Kitchen escitalopram (LEXAPRO) 20 MG tablet Take 1 tablet (20 mg total) by mouth daily. 30 tablet 4  . folic acid (FOLVITE) 1 MG tablet Take 1 mg by mouth daily after lunch.   0  . furosemide (LASIX) 20 MG tablet Take 1 tablet (20 mg  total) by mouth daily. 30 tablet 11  . linaclotide (LINZESS) 145 MCG CAPS capsule Take 1 capsule (145 mcg total) by mouth daily. 30 capsule 1  . losartan (COZAAR) 25 MG tablet Take 25 mg by mouth daily.   3  . Melatonin 10 MG TABS Take 10 mg by mouth at bedtime as needed (for sleep).     . methotrexate (RHEUMATREX) 2.5 MG tablet Take 22.5 mg by mouth every Sunday.     . Omega-3 Fatty Acids (FISH OIL) 1000 MG CAPS Take 1,000 mg by mouth daily.    . polyethylene glycol (MIRALAX / GLYCOLAX) packet Take 17 g by mouth daily. 14 each 0  . simvastatin (ZOCOR) 20 MG tablet TAKE 1 TABLET BY MOUTH EVERY NIGHT AT BEDTIME 30 tablet 0  . traMADol (ULTRAM) 50 MG tablet Take 1 tablet (50  mg total) by mouth every 6 (six) hours. 120 tablet 0  . vitamin E 400 UNIT capsule Take 400 Units by mouth daily after lunch.    . docusate sodium (COLACE) 100 MG capsule Take 1 capsule (100 mg total) by mouth 2 (two) times daily. (Patient not taking: Reported on 04/25/2018) 10 capsule 0   No facility-administered medications prior to visit.      Allergies:   Ativan [lorazepam]; Phenergan [promethazine hcl]; and Eggs or egg-derived products   Social History   Socioeconomic History  . Marital status: Widowed    Spouse name: Not on file  . Number of children: Not on file  . Years of education: Not on file  . Highest education level: Not on file  Occupational History  . Not on file  Social Needs  . Financial resource strain: Not on file  . Food insecurity:    Worry: Not on file    Inability: Not on file  . Transportation needs:    Medical: Not on file    Non-medical: Not on file  Tobacco Use  . Smoking status: Former Smoker    Years: 40.00    Types: Cigarettes    Start date: 06/13/1973    Last attempt to quit: 04/03/1981    Years since quitting: 37.0  . Smokeless tobacco: Never Used  Substance and Sexual Activity  . Alcohol use: Yes    Comment: occasional  . Drug use: No  . Sexual activity: Not on file    Lifestyle  . Physical activity:    Days per week: Not on file    Minutes per session: Not on file  . Stress: Not on file  Relationships  . Social connections:    Talks on phone: Not on file    Gets together: Not on file    Attends religious service: Not on file    Active member of club or organization: Not on file    Attends meetings of clubs or organizations: Not on file    Relationship status: Not on file  Other Topics Concern  . Not on file  Social History Narrative  . Not on file     Family History:  The patient's family history includes Breast cancer in his sister; CVA in his father; Diabetes in his brother and brother; Pulmonary embolism (age of onset: 103) in his mother.      ROS:   Please see the history of present illness.    ROS All other systems reviewed and are negative.   PHYSICAL EXAM:   VS:  BP 132/62   Pulse (!) 56   Ht 5\' 6"  (1.676 m)   Wt 170 lb 1.9 oz (77.2 kg)   SpO2 95%   BMI 27.46 kg/m    GEN: Well nourished, well developed, in no acute distress HEENT: normal Neck: no JVD or masses Cardiac: RRR; 2/6 systolic murmurs. No rubs, or gallops,no edema  Respiratory:  clear to auscultation bilaterally, normal work of breathing GI: soft, nontender, nondistended, + BS MS: no deformity or atrophy Skin: warm and dry, no rash Neuro:  Alert and Oriented x 3, Strength and sensation are intact Psych: euthymic mood, full affect   Wt Readings from Last 3 Encounters:  04/25/18 170 lb 1.9 oz (77.2 kg)  04/11/18 169 lb (76.7 kg)  04/03/18 168 lb 1.9 oz (76.3 kg)      Studies/Labs Reviewed:   EKG:  EKG is NOT ordered today.    Recent Labs: 02/04/2018: TSH 5.467  03/23/2018: ALT 20; B Natriuretic Peptide 78.0 03/28/2018: BUN 14; Creatinine, Ser 1.13; Magnesium 2.0; Potassium 4.0; Sodium 138 04/03/2018: Hemoglobin 11.3; Platelets 150   Lipid Panel    Component Value Date/Time   CHOL 169 12/05/2013 0808   TRIG 136.0 12/05/2013 0808   HDL 51.80  12/05/2013 0808   CHOLHDL 3 12/05/2013 0808   VLDL 27.2 12/05/2013 0808   LDLCALC 90 12/05/2013 0808    Additional studies/ records that were reviewed today include:  TAVR OPERATIVE NOTE   Date of Procedure:03/27/2018  Preoperative Diagnosis:Severe Aortic Stenosis  Procedure:   Transcatheter Aortic Valve Replacement - PercutaneousRightTransfemoral Approach Edwards Sapien 3 THV (size 34mm, model # 9600TFX, serial # [id number])  Co-Surgeons:Clarence H. Roxy Manns, MD and Lauree Chandler, MD   Pre-operative Echo Findings: ? Severe aortic stenosis ? Normalleft ventricular systolic function  Post-operative Echo Findings: ? Mildparavalvular leak ? Normalleft ventricular systolic function  __________________  Echo 03/28/18  Study Conclusions - Left ventricle: The cavity size was normal. There was moderate concentric hypertrophy. Systolic function was normal. The estimated ejection fraction was in the range of 60% to 65%. There was dynamic obstruction at rest, with a peak velocity of 200 cm/sec and a peak gradient of 16 mm Hg. Wall motion was normal; there were no regional wall motion abnormalities. Doppler parameters are consistent with abnormal left ventricular relaxation (grade 1 diastolic dysfunction). Doppler parameters are consistent with indeterminate ventricular filling pressure. - Aortic valve: A 46mm Edwards Sapien TAVR bioprosthesis was present and well-seated. Transvalvular velocity was mildly increased. Peak velocity (S): 288 cm/s. Mean gradient (S): 15 mm Hg. Valve area (VTI): 2.17 cm^2. Valve area (Vmax): 1.94 cm^2. Valve area (Vmean): 2.09 cm^2. - Mitral valve: Transvalvular velocity was within the normal range. There was no evidence for stenosis. There was no regurgitation. - Left atrium: The atrium was moderately dilated. - Right ventricle: The cavity  size was normal. Wall thickness was normal. Systolic function was normal. - Tricuspid valve: There was mild regurgitation. - Pulmonary arteries: Systolic pressure was within the normal range. - Pericardium, extracardiac: A trivial pericardial effusion was identified.  ______________  Echo 04/25/18 (1 month s/p TAVR)  Study Conclusions  - Left ventricle: The cavity size was normal. There was mild   concentric hypertrophy. Systolic function was normal. The   estimated ejection fraction was in the range of 60% to 65%. Wall   motion was normal; there were no regional wall motion   abnormalities. Doppler parameters are consistent with abnormal   left ventricular relaxation (grade 1 diastolic dysfunction). - Aortic valve: S/P TAVR. A prosthesis was present and functioning   normally. The prosthesis had a normal range of motion. The sewing   ring appeared normal, had no rocking motion, and showed no   evidence of dehiscence. There was moderate perivalvular   regurgitation. Mean gradient (S): 14 mm Hg. Regurgitation   pressure half-time: 386 ms. - Left atrium: The atrium was mildly dilated. - Atrial septum: No defect or patent foramen ovale was identified. - Pulmonary arteries: PA peak pressure: 32 mm Hg (S). Impressions: - S/P TAVR. On this study, there is moderate aortic regurgitation   that appears most likel perivalvular in nature. It was not seen   on prior study. Mean gradient is similar to prior.    ASSESSMENT & PLAN:   Severe AS s/p TAVR: echo today shows EF 60%, normally functioning TAVR with mean gradient of 14 mm HG (previously 15 mm hg) and new moderate PVL. Will have Dr.  McAlhany review echo. He has NYHA class I symptoms. SBE prophylaxis discussed; Amoxicillin has been called into his pharmacy. ASA can be discontinued after 6 months of therapy (09/2018).   Right groin inguinal hernia:his PCP has referred him to Dr. Harlow Asa with [facility] and he has  an appointment 11/27. He understands that we would like to get him through at least 3 months of uninterrupted DAPT with ASA and plavix. If hernia incarcerated or his pain cannot be controlled, he may need surgery sooner. I will forward my note to Dr. Armandina Gemma   Chronic diastolic CHF: appears euvolemic. Continue lasix 20 mg daily  HTN: Bp well controlled today   HLD: continue statin   Rheumatoid arthritis:continue MTX  Addendum: discussed echo report of new PVL with Dr. Angelena Form and Dr. Roxy Manns. It was felt that there was nothing to do in the absence of significant heart failure symptoms or symptomatic anemia. This was discussed in length with his daughter over the phone.   Medication Adjustments/Labs and Tests Ordered: Current medicines are reviewed at length with the patient today.  Concerns regarding medicines are outlined above.  Medication changes, Labs and Tests ordered today are listed in the Patient Instructions below. Patient Instructions  Medication Instructions:  1) Nell Range, PA discussed the importance of taking an antibiotic prior to any dental visits to prevent damage to the heart valves from infection. You were given AMOXIL 2,000 mg to take 1 hour prior to dental visits.  2) STOP PLAVIX September 26, 2018   Labwork: None  Testing/Procedures: Your provider has requested that you have an echocardiogram in 1 year. Echocardiography is a painless test that uses sound waves to create images of your heart. It provides your doctor with information about the size and shape of your heart and how well your heart's chambers and valves are working. This procedure takes approximately one hour. There are no restrictions for this procedure.  Follow-Up: You have an appointment with Dr. Marlou Porch on July 24, 2018 at 1:20PM.  You will be called to arrange your 1 year TAVR echocardiogram and office visit!    Signed, Angelena Form, PA-C  04/25/2018 1:47 PM    [facility] [address], [place], [place]  [address] Phone: [phone]; Fax: [phone]

## 2018-04-25 ENCOUNTER — Other Ambulatory Visit: Payer: Self-pay

## 2018-04-25 ENCOUNTER — Ambulatory Visit (HOSPITAL_COMMUNITY): Payer: Medicare Other | Attending: Cardiology

## 2018-04-25 ENCOUNTER — Ambulatory Visit (INDEPENDENT_AMBULATORY_CARE_PROVIDER_SITE_OTHER): Payer: Medicare Other | Admitting: Physician Assistant

## 2018-04-25 ENCOUNTER — Encounter: Payer: Self-pay | Admitting: Physician Assistant

## 2018-04-25 VITALS — BP 132/62 | HR 56 | Ht 66.0 in | Wt 170.1 lb

## 2018-04-25 DIAGNOSIS — E785 Hyperlipidemia, unspecified: Secondary | ICD-10-CM

## 2018-04-25 DIAGNOSIS — I10 Essential (primary) hypertension: Secondary | ICD-10-CM

## 2018-04-25 DIAGNOSIS — M069 Rheumatoid arthritis, unspecified: Secondary | ICD-10-CM

## 2018-04-25 DIAGNOSIS — K4090 Unilateral inguinal hernia, without obstruction or gangrene, not specified as recurrent: Secondary | ICD-10-CM

## 2018-04-25 DIAGNOSIS — Z79899 Other long term (current) drug therapy: Secondary | ICD-10-CM | POA: Diagnosis not present

## 2018-04-25 DIAGNOSIS — Z952 Presence of prosthetic heart valve: Secondary | ICD-10-CM | POA: Diagnosis not present

## 2018-04-25 DIAGNOSIS — I5032 Chronic diastolic (congestive) heart failure: Secondary | ICD-10-CM | POA: Diagnosis not present

## 2018-04-25 DIAGNOSIS — M0609 Rheumatoid arthritis without rheumatoid factor, multiple sites: Secondary | ICD-10-CM | POA: Diagnosis not present

## 2018-04-25 LAB — ECHOCARDIOGRAM COMPLETE
HEIGHTINCHES: 66 in
WEIGHTICAEL: 2721.92 [oz_av]

## 2018-04-25 MED ORDER — AMOXICILLIN 500 MG PO TABS: ORAL_TABLET | ORAL | 6 refills | Status: DC

## 2018-04-25 NOTE — Patient Instructions (Addendum)
Medication Instructions:  1) Nell Range, PA discussed the importance of taking an antibiotic prior to any dental visits to prevent damage to the heart valves from infection. You were given AMOXIL 2,000 mg to take 1 hour prior to dental visits.  2) STOP PLAVIX September 26, 2018   Labwork: None  Testing/Procedures: Your provider has requested that you have an echocardiogram in 1 year. Echocardiography is a painless test that uses sound waves to create images of your heart. It provides your doctor with information about the size and shape of your heart and how well your heart's chambers and valves are working. This procedure takes approximately one hour. There are no restrictions for this procedure.  Follow-Up: You have an appointment with Dr. Marlou Porch on July 24, 2018 at 1:20PM.  You will be called to arrange your 1 year TAVR echocardiogram and office visit!

## 2018-05-04 DIAGNOSIS — R351 Nocturia: Secondary | ICD-10-CM | POA: Diagnosis not present

## 2018-05-04 DIAGNOSIS — R350 Frequency of micturition: Secondary | ICD-10-CM | POA: Diagnosis not present

## 2018-05-04 DIAGNOSIS — R3915 Urgency of urination: Secondary | ICD-10-CM | POA: Diagnosis not present

## 2018-05-09 DIAGNOSIS — K4090 Unilateral inguinal hernia, without obstruction or gangrene, not specified as recurrent: Secondary | ICD-10-CM | POA: Diagnosis not present

## 2018-05-14 ENCOUNTER — Telehealth (HOSPITAL_COMMUNITY): Payer: Self-pay

## 2018-05-14 NOTE — Telephone Encounter (Signed)
Called and spoke to pt daughter [name] who stated pt is not walking at this time and will like to hold off on CR. Will call when pt is ready.  Closed referral

## 2018-05-17 ENCOUNTER — Encounter: Payer: Self-pay | Admitting: Thoracic Surgery (Cardiothoracic Vascular Surgery)

## 2018-06-04 ENCOUNTER — Emergency Department (HOSPITAL_COMMUNITY): Payer: Medicare Other

## 2018-06-04 ENCOUNTER — Observation Stay (HOSPITAL_COMMUNITY)
Admission: EM | Admit: 2018-06-04 | Discharge: 2018-06-05 | Disposition: A | Discharge status: Home / Self Care | Payer: Medicare Other | Attending: Internal Medicine | Admitting: Internal Medicine

## 2018-06-04 ENCOUNTER — Encounter (HOSPITAL_COMMUNITY): Payer: Self-pay | Admitting: Emergency Medicine

## 2018-06-04 ENCOUNTER — Telehealth: Payer: Self-pay | Admitting: Cardiology

## 2018-06-04 ENCOUNTER — Other Ambulatory Visit: Payer: Self-pay

## 2018-06-04 DIAGNOSIS — R0789 Other chest pain: Principal | ICD-10-CM | POA: Diagnosis present

## 2018-06-04 DIAGNOSIS — I2510 Atherosclerotic heart disease of native coronary artery without angina pectoris: Secondary | ICD-10-CM | POA: Diagnosis not present

## 2018-06-04 DIAGNOSIS — Z7902 Long term (current) use of antithrombotics/antiplatelets: Secondary | ICD-10-CM | POA: Insufficient documentation

## 2018-06-04 DIAGNOSIS — M069 Rheumatoid arthritis, unspecified: Secondary | ICD-10-CM | POA: Insufficient documentation

## 2018-06-04 DIAGNOSIS — K219 Gastro-esophageal reflux disease without esophagitis: Secondary | ICD-10-CM | POA: Diagnosis not present

## 2018-06-04 DIAGNOSIS — N281 Cyst of kidney, acquired: Secondary | ICD-10-CM | POA: Diagnosis not present

## 2018-06-04 DIAGNOSIS — N400 Enlarged prostate without lower urinary tract symptoms: Secondary | ICD-10-CM | POA: Diagnosis not present

## 2018-06-04 DIAGNOSIS — E785 Hyperlipidemia, unspecified: Secondary | ICD-10-CM | POA: Insufficient documentation

## 2018-06-04 DIAGNOSIS — I350 Nonrheumatic aortic (valve) stenosis: Secondary | ICD-10-CM | POA: Insufficient documentation

## 2018-06-04 DIAGNOSIS — Z79899 Other long term (current) drug therapy: Secondary | ICD-10-CM | POA: Diagnosis not present

## 2018-06-04 DIAGNOSIS — Z79891 Long term (current) use of opiate analgesic: Secondary | ICD-10-CM | POA: Insufficient documentation

## 2018-06-04 DIAGNOSIS — R079 Chest pain, unspecified: Secondary | ICD-10-CM | POA: Diagnosis present

## 2018-06-04 DIAGNOSIS — R0602 Shortness of breath: Secondary | ICD-10-CM | POA: Diagnosis not present

## 2018-06-04 DIAGNOSIS — F329 Major depressive disorder, single episode, unspecified: Secondary | ICD-10-CM | POA: Diagnosis not present

## 2018-06-04 DIAGNOSIS — R001 Bradycardia, unspecified: Secondary | ICD-10-CM | POA: Insufficient documentation

## 2018-06-04 DIAGNOSIS — Z7982 Long term (current) use of aspirin: Secondary | ICD-10-CM | POA: Diagnosis not present

## 2018-06-04 DIAGNOSIS — I10 Essential (primary) hypertension: Secondary | ICD-10-CM | POA: Insufficient documentation

## 2018-06-04 DIAGNOSIS — Z952 Presence of prosthetic heart valve: Secondary | ICD-10-CM | POA: Diagnosis not present

## 2018-06-04 LAB — CBC
HCT: 42.7 % (ref 39.0–52.0)
Hemoglobin: 13.5 g/dL (ref 13.0–17.0)
MCH: 29.7 pg (ref 26.0–34.0)
MCHC: 31.6 g/dL (ref 30.0–36.0)
MCV: 94.1 fL (ref 80.0–100.0)
NRBC: 0 % (ref 0.0–0.2)
Platelets: 133 10*3/uL — ABNORMAL LOW (ref 150–400)
RBC: 4.54 MIL/uL (ref 4.22–5.81)
RDW: 13 % (ref 11.5–15.5)
WBC: 6.1 10*3/uL (ref 4.0–10.5)

## 2018-06-04 LAB — BASIC METABOLIC PANEL
ANION GAP: 8 (ref 5–15)
BUN: 19 mg/dL (ref 8–23)
CHLORIDE: 105 mmol/L (ref 98–111)
CO2: 27 mmol/L (ref 22–32)
Calcium: 9.1 mg/dL (ref 8.9–10.3)
Creatinine, Ser: 1.11 mg/dL (ref 0.61–1.24)
GFR calc Af Amer: >60 mL/min (ref >60)
GFR calc non Af Amer: 59 mL/min — ABNORMAL LOW (ref >60)
Glucose, Bld: 105 mg/dL — ABNORMAL HIGH (ref 70–99)
POTASSIUM: 4.1 mmol/L (ref 3.5–5.1)
SODIUM: 140 mmol/L (ref 135–145)

## 2018-06-04 LAB — I-STAT TROPONIN, ED: Troponin i, poc: 0 ng/mL (ref 0.00–0.08)

## 2018-06-04 LAB — PROTIME-INR
INR: 1.03
PROTHROMBIN TIME: 13.4 s (ref 11.4–15.2)

## 2018-06-04 LAB — BRAIN NATRIURETIC PEPTIDE: B Natriuretic Peptide: 45.6 pg/mL (ref 0.0–100.0)

## 2018-06-04 MED ORDER — IOPAMIDOL (ISOVUE-370) INJECTION 76%
INTRAVENOUS | Status: AC
  Filled 2018-06-04: qty 100

## 2018-06-04 MED ORDER — ONDANSETRON HCL 4 MG/2ML IJ SOLN: 4.0000 mg | Freq: Four times a day (QID) | INTRAMUSCULAR | Status: DC | PRN

## 2018-06-04 MED ORDER — MELATONIN 3 MG PO TABS: 3.0000 mg | ORAL_TABLET | Freq: Every evening | ORAL | Status: DC | PRN

## 2018-06-04 MED ORDER — NITROGLYCERIN 0.4 MG SL SUBL: 0.4000 mg | SUBLINGUAL_TABLET | SUBLINGUAL | Status: DC | PRN

## 2018-06-04 MED ORDER — MORPHINE SULFATE (PF) 2 MG/ML IV SOLN
2.0000 mg | Freq: Once | INTRAVENOUS | Status: AC
  Administered 2018-06-04: 2 mg via INTRAVENOUS
  Filled 2018-06-04: qty 1

## 2018-06-04 MED ORDER — LOSARTAN POTASSIUM 25 MG PO TABS
25.0000 mg | ORAL_TABLET | Freq: Every day | ORAL | Status: DC
  Administered 2018-06-05: 25 mg via ORAL
  Filled 2018-06-04: qty 1

## 2018-06-04 MED ORDER — ENOXAPARIN SODIUM 40 MG/0.4ML ~~LOC~~ SOLN
40.0000 mg | Freq: Every day | SUBCUTANEOUS | Status: DC
  Filled 2018-06-04 (×2): qty 0.4

## 2018-06-04 MED ORDER — ACETAMINOPHEN 325 MG PO TABS: 650.0000 mg | ORAL_TABLET | ORAL | Status: DC | PRN

## 2018-06-04 MED ORDER — ASPIRIN EC 81 MG PO TBEC
81.0000 mg | DELAYED_RELEASE_TABLET | Freq: Every day | ORAL | Status: DC
  Administered 2018-06-05: 81 mg via ORAL
  Filled 2018-06-04: qty 1

## 2018-06-04 MED ORDER — FUROSEMIDE 20 MG PO TABS
20.0000 mg | ORAL_TABLET | Freq: Every day | ORAL | Status: DC
  Administered 2018-06-05: 20 mg via ORAL
  Filled 2018-06-04: qty 1

## 2018-06-04 MED ORDER — SIMVASTATIN 20 MG PO TABS
20.0000 mg | ORAL_TABLET | Freq: Every day | ORAL | Status: DC
  Administered 2018-06-05: 20 mg via ORAL
  Filled 2018-06-04: qty 1

## 2018-06-04 MED ORDER — CLOPIDOGREL BISULFATE 75 MG PO TABS
75.0000 mg | ORAL_TABLET | Freq: Every day | ORAL | Status: DC
  Administered 2018-06-05: 75 mg via ORAL
  Filled 2018-06-04: qty 1

## 2018-06-04 MED ORDER — IOPAMIDOL (ISOVUE-370) INJECTION 76%
100.0000 mL | Freq: Once | INTRAVENOUS | Status: AC | PRN
  Administered 2018-06-04: 100 mL via INTRAVENOUS

## 2018-06-04 NOTE — ED Triage Notes (Signed)
Pt with left sided chest pain with radiation to the shoulder blade. Reports shortness of breath also. Recent TAVR procedure cardiology sending patient here for evaluation. A/o at triage.

## 2018-06-04 NOTE — Telephone Encounter (Signed)
Spoke with daughter [name] (on [place])  She is reporting last night pt had an episode that was a lot like the "Congestive heart failure he had before his TAVR."  She reports his BP became elevated and he was very short of breath.  Pt took extra Lasix which did give him some relief but he still c/o "tightness in his chest at his heart."  They report weight is stable, no edema, no abdominal distension, no cough, no fever.  He is c/o having trouble breathing.  Advised daughter difficult to treat without him being seen and advised since he is having that much trouble breathing, it would be best for him to be evaluated in the ED.  Daughter states she will see if he can be seen by his PCP.

## 2018-06-04 NOTE — ED Provider Notes (Signed)
[facility] EMERGENCY DEPARTMENT Provider Note   CSN: [id number] Arrival date & time: 06/04/18  1251     History   Chief Complaint Chief Complaint  Patient presents with  . Chest Pain  . Shortness of Breath    HPI Wilborn Membreno is a 84 y.o. male.  Patient is a 84 year old male with a history of hyperlipidemia, rheumatoid arthritis, hypertension and severe aortic stenosis status post TAVR procedure on October 15 of this year.  He presents with chest pain and shortness of breath.  He has been having some pressure feeling in his left chest that also radiates behind his right shoulder blade for the last 3 days.  He has had some associated shortness of breath.  No significant coughing.  No fevers.  No increased leg swelling.  His wife states they were waiting to see his cardiologist today but was told to go to the emergency room.  His symptoms have been fairly constant.  His wife states he had similar symptoms when he went into congestive heart failure prior to the TAVR.  His pain seems to be worse with deep inspiration.  He has not take anything at home for pain.     Past Medical History:  Diagnosis Date  . BPH (benign prostatic hypertrophy)   . Depression   . Diverticulosis of colon   . GERD (gastroesophageal reflux disease)   . Gilbert's syndrome   . History of kidney stones   . HLD (hyperlipidemia)   . HOH (hard of hearing)    refuses to wear his Hearing Aid  . Hypertension   . Inguinal hernia    right  . Internal hemorrhoid   . Normal coronary arteries    a. Cath 07/2010: normal coronaries. Felt to have noncardiac CP/SOB at that time possibly r/t anxiety.  . RA (rheumatoid arthritis) ([place])    bilateral hands/ wrist--  seronegative  . S/P TAVR (transcatheter aortic valve replacement) 03/27/2018   23 mm Edwards Sapien 3 transcatheter heart valve placed via percutaneous right transfemoral approach   . Severe aortic stenosis   . Thyroid nodule    noted  11-2009  . Wears dentures     Patient Active Problem List   Diagnosis Date Noted  . S/P TAVR (transcatheter aortic valve replacement) 03/27/2018  . RA (rheumatoid arthritis) ([place])   . HOH (hard of hearing)   . GERD (gastroesophageal reflux disease)   . Benign prostatic hyperplasia   . HLD (hyperlipidemia)   . Depression   . Diverticulosis 02/07/2018  . Severe aortic stenosis   . Benign essential HTN   . Chest pain 08/20/2016  . 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 Sinus bradycardia 06/20/2014    Past Surgical History:  Procedure Laterality Date  . CARDIAC CATHETERIZATION  07-19-2010  dr Tressia Miners turner   normal coronary arteries,  ef 60%,  moderate aortic stenosis- gradient 84mmHg, normal right heart pressure  . CARDIOVASCULAR STRESS TEST  05/ 2011   dr Marlou Porch   normal low risk perfusion study  . CATARACT EXTRACTION W/ INTRAOCULAR LENS  IMPLANT, BILATERAL  2013  . CATARACT EXTRACTION, BILATERAL    . COLONOSCOPY  2010  approx  . INTRAOPERATIVE TRANSTHORACIC ECHOCARDIOGRAM N/A 03/27/2018   Procedure: INTRAOPERATIVE TRANSTHORACIC ECHOCARDIOGRAM;  Surgeon: Burnell Blanks, MD;  Location: [facility];  Service: Open Heart Surgery;  Laterality: N/A;  . RIGHT/LEFT HEART CATH AND CORONARY ANGIOGRAPHY N/A 02/06/2018   Procedure: RIGHT/LEFT HEART CATH AND CORONARY  ANGIOGRAPHY;  Surgeon: Burnell Blanks, MD;  Location: [facility];  Service: Cardiovascular;  Laterality: N/A;  . THYROID LOBECTOMY Right 05/05/2015   Procedure: RIGHT THYROID LOBECTOMY;  Surgeon: Armandina Gemma, MD;  Location: [facility];  Service: General;  Laterality: Right;  . TRANSANAL HEMORRHOIDAL DEARTERIALIZATION N/A 04/09/2014   Procedure: TRANSANAL HEMORRHOIDAL DEARTERIALIZATION OF INTERNAL HEMORROIDS;  Surgeon: Leighton Ruff, MD;  Location: [facility];  Service: General;  Laterality: N/A;  . TRANSCATHETER AORTIC VALVE REPLACEMENT, TRANSFEMORAL  03/27/2018  .  TRANSCATHETER AORTIC VALVE REPLACEMENT, TRANSFEMORAL N/A 03/27/2018   Procedure: TRANSCATHETER AORTIC VALVE REPLACEMENT, TRANSFEMORAL. Edwards SAPIEN 3 Transcatheter Heart Valve 32mm.;  Surgeon: Burnell Blanks, MD;  Location: [facility];  Service: Open Heart Surgery;  Laterality: N/A;  . TRANSTHORACIC ECHOCARDIOGRAM  11-26-2013   moderate focal basal and mild LVH/  ef 56-43%/  grade I diastolic dysfunction/ mild LAE/  moderate calcification with stenosis AV with mild regurg,  gradients 35 abd 58 mmHg /  mild TR        Home Medications    Prior to Admission medications   Medication Sig Start Date End Date Taking? Authorizing Provider  Ascorbic Acid (VITAMIN C) 1000 MG tablet Take 1,000 mg by mouth daily after lunch.   Yes [provider]  clopidogrel (PLAVIX) 75 MG tablet Take 1 tablet (75 mg total) by mouth daily with breakfast. 03/29/18  Yes Eileen Stanford, PA-C  dutasteride (AVODART) 0.5 MG capsule Take 0.5 mg by mouth daily.   Yes [provider]  escitalopram (LEXAPRO) 20 MG tablet Take 1 tablet (20 mg total) by mouth daily. 09/06/16  Yes Angiulli, Lavon Paganini, PA-C  folic acid (FOLVITE) 1 MG tablet Take 1 mg by mouth daily after lunch.  01/27/15  Yes [provider]  furosemide (LASIX) 20 MG tablet Take 1 tablet (20 mg total) by mouth daily. 02/07/18 02/07/19 Yes Elodia Florence., MD  ibuprofen (ADVIL,MOTRIN) 800 MG tablet Take 800 mg by mouth every morning.   Yes [provider]  losartan (COZAAR) 25 MG tablet Take 25 mg by mouth daily.  01/21/18  Yes [provider]  Melatonin 10 MG TABS Take 10 mg by mouth at bedtime as needed (for sleep).    Yes [provider]  methotrexate (RHEUMATREX) 2.5 MG tablet Take 22.5 mg by mouth every Sunday.  07/13/13  Yes [provider]  Omega-3 Fatty Acids (FISH OIL) 1000 MG CAPS Take 1,000 mg by mouth daily.   Yes [provider]  simvastatin (ZOCOR) 20 MG tablet TAKE 1  TABLET BY MOUTH EVERY NIGHT AT BEDTIME Patient taking differently: Take 20 mg by mouth daily.  05/06/14  Yes Jerline Pain, MD  traMADol (ULTRAM) 50 MG tablet Take 1 tablet (50 mg total) by mouth every 6 (six) hours. Patient taking differently: Take 50 mg by mouth every 6 (six) hours as needed for moderate pain.  09/06/16  Yes Angiulli, Lavon Paganini, PA-C  vitamin E 400 UNIT capsule Take 400 Units by mouth daily after lunch.   Yes [provider]  amoxicillin (AMOXIL) 500 MG tablet Take 4 tablets (2,000 mg) 1 hour prior to dental visits. 04/25/18   Eileen Stanford, PA-C  aspirin 81 MG tablet Take 81 mg by mouth daily.    [provider]  cyclobenzaprine (FLEXERIL) 5 MG tablet Take 1 tablet (5 mg total) by mouth 3 (three) times daily. 09/06/16   Angiulli, Lavon Paganini, PA-C  docusate sodium (COLACE)  100 MG capsule Take 100 mg by mouth daily.    [provider]  linaclotide (LINZESS) 145 MCG CAPS capsule Take 1 capsule (145 mcg total) by mouth daily. 09/07/16   Angiulli, Lavon Paganini, PA-C  polyethylene glycol (MIRALAX / GLYCOLAX) packet Take 17 g by mouth daily. 09/07/16   Angiulli, Lavon Paganini, PA-C    Family History Family History  Problem Relation Age of Onset  . Pulmonary embolism Mother 62       caused by complications of surgery  . CVA Father   . Diabetes Brother   . Diabetes Brother   . Breast cancer Sister     Social History Social History   Tobacco Use  . Smoking status: Former Smoker    Years: 40.00    Types: Cigarettes    Start date: 06/13/1973    Last attempt to quit: 04/03/1981    Years since quitting: 37.1  . Smokeless tobacco: Never Used  Substance Use Topics  . Alcohol use: Yes    Comment: occasional  . Drug use: No     Allergies   Ativan [lorazepam]; Phenergan [promethazine hcl]; and Eggs or egg-derived products   Review of Systems Review of Systems  Constitutional: Negative for chills, diaphoresis, fatigue and fever.  HENT: Negative for  congestion, rhinorrhea and sneezing.   Eyes: Negative.   Respiratory: Positive for shortness of breath. Negative for cough and chest tightness.   Cardiovascular: Positive for chest pain. Negative for leg swelling.  Gastrointestinal: Negative for abdominal pain, blood in stool, diarrhea, nausea and vomiting.  Genitourinary: Negative for difficulty urinating, flank pain, frequency and hematuria.  Musculoskeletal: Negative for arthralgias and back pain.  Skin: Negative for rash.  Neurological: Negative for dizziness, speech difficulty, weakness, numbness and headaches.     Physical Exam Updated Vital Signs BP 131/64 (BP Location: Left Arm)   Pulse (!) 59   Temp 98 F (36.7 C) (Oral)   Resp 18   Ht 5\' 6"  (1.676 m)   Wt 76.8 kg   SpO2 97%   BMI 27.33 kg/m   Physical Exam Constitutional:      Appearance: He is well-developed.  HENT:     Head: Normocephalic and atraumatic.  Eyes:     Pupils: Pupils are equal, round, and reactive to light.  Neck:     Musculoskeletal: Normal range of motion and neck supple.  Cardiovascular:     Rate and Rhythm: Normal rate and regular rhythm.     Heart sounds: Murmur present.  Pulmonary:     Effort: Pulmonary effort is normal. No respiratory distress.     Breath sounds: Normal breath sounds. No wheezing or rales.  Chest:     Chest wall: No tenderness.  Abdominal:     General: Bowel sounds are normal.     Palpations: Abdomen is soft.     Tenderness: There is no abdominal tenderness. There is no guarding or rebound.  Musculoskeletal: Normal range of motion.     Right lower leg: No edema.     Left lower leg: No edema.  Lymphadenopathy:     Cervical: No cervical adenopathy.  Skin:    General: Skin is warm and dry.     Findings: No rash.  Neurological:     Mental Status: He is alert and oriented to person, place, and time.      ED Treatments / Results  Labs (all labs ordered are listed, but only abnormal results are displayed) Labs  Reviewed  BASIC METABOLIC  PANEL - Abnormal; Notable for the following components:      Result Value   Glucose, Bld 105 (*)    GFR calc non Af Amer 59 (*)    All other components within normal limits  CBC - Abnormal; Notable for the following components:   Platelets 133 (*)    All other components within normal limits  PROTIME-INR  BRAIN NATRIURETIC PEPTIDE  I-STAT TROPONIN, ED    EKG EKG Interpretation  Date/Time:  Monday June 04 2018 19:55:26 EST Ventricular Rate:  55 PR Interval:    QRS Duration: 104 QT Interval:  445 QTC Calculation: 426 R Axis:   45 Text Interpretation:  Sinus rhythm since last tracing no significant change Confirmed by Malvin Johns [phone]) on 06/04/2018 8:15:12 PM   Radiology Dg Chest 2 View  Result Date: 06/04/2018 CLINICAL DATA:  84 year old male with left side chest pain radiating to the shoulder blade. Shortness of breath. Recent TAVR. EXAM: CHEST - 2 VIEW COMPARISON:  Portable chest 03/27/2018 and earlier. FINDINGS: Since the prior portable examination the right IJ central line has been removed. Lung volumes and mediastinal contours are within normal limits. Sequelae of TAVR. Visualized tracheal air column is within normal limits. Both lungs appear clear. No pneumothorax, pulmonary edema, or pleural effusion. No acute osseous abnormality identified. Chronic surgical clips at the right thoracic inlet probably related to thyroidectomy. Negative visible bowel gas pattern. IMPRESSION: No acute cardiopulmonary abnormality. Electronically Signed   By: Genevie Ann M.D.   On: 06/04/2018 14:04   Ct Angio Chest/abd/pel For Dissection W And/or W/wo  Result Date: 06/04/2018 CLINICAL DATA:  Left-sided chest pain and shortness of breath EXAM: CT ANGIOGRAPHY CHEST, ABDOMEN AND PELVIS TECHNIQUE: Multidetector CT imaging through the chest, abdomen and pelvis was performed using the standard protocol during bolus administration of intravenous contrast. Multiplanar  reconstructed images and MIPs were obtained and reviewed to evaluate the vascular anatomy. CONTRAST:  166mL ISOVUE-370 IOPAMIDOL (ISOVUE-370) INJECTION 76% COMPARISON:  Plain film from earlier today, CT from 02/07/2018 FINDINGS: CTA CHEST FINDINGS Cardiovascular: Changes of prior TAVR are noted. Heart is at the upper limits of normal in size. Thoracic aorta demonstrates atherosclerotic calcification without aneurysmal dilatation or dissection. Pulmonary artery shows a normal branching pattern. No pulmonary emboli are seen. Coronary calcifications are noted. Mediastinum/Nodes: Thoracic inlet demonstrates changes consistent with hemithyroidectomy on the right. No significant lymphadenopathy is noted. No mediastinal or hilar adenopathy is seen. Some calcified right hilar nodes are noted consistent with prior granulomatous disease. The esophagus is within normal limits. Lungs/Pleura: Lungs are well aerated bilaterally. Right lower lobe and left upper lobe nodules are again seen and stable from the prior exam and again felt to be benign in etiology. No new nodules are seen. No focal infiltrate or sizable effusion is seen. Musculoskeletal: Degenerative changes of the thoracic spine are noted. No acute bony abnormality is seen. Review of the MIP images confirms the above findings. CTA ABDOMEN AND PELVIS FINDINGS VASCULAR Aorta: Abdominal aorta demonstrates mild atherosclerotic calcifications without aneurysmal dilatation or dissection. Celiac: Patent without evidence of aneurysm, dissection, vasculitis or significant stenosis. SMA: Patent without evidence of aneurysm, dissection, vasculitis or significant stenosis. Renals: Single renal arteries are identified bilaterally. No significant stenosis is seen. IMA: Patent without evidence of aneurysm, dissection, vasculitis or significant stenosis. Iliacs: Mild calcifications are noted without aneurysmal dilatation or dissection. Veins: No specific venous abnormality is noted.  Review of the MIP images confirms the above findings. NON-VASCULAR Hepatobiliary: No focal liver abnormality is  seen. No gallstones, gallbladder wall thickening, or biliary dilatation. Pancreas: Unremarkable. No pancreatic ductal dilatation or surrounding inflammatory changes. Spleen: Normal in size without focal abnormality. Adrenals/Urinary Tract: The adrenal glands are within normal limits bilaterally. Bilateral renal cysts are noted. The largest of these lies on the right and measures 3.5 cm in greatest dimension. No renal calculi or obstructive changes are seen. The bladder is well distended. Circumaortic left renal vein is noted. Stomach/Bowel: Diffuse diverticular change of the sigmoid colon is noted. No evidence of diverticulitis is seen. The appendix is well visualized and within normal limits. No obstructive or inflammatory changes of the bowel are seen. The stomach is unremarkable. Lymphatic: No significant lymphadenopathy is identified. Reproductive: Prostate is unremarkable. Other: Right inguinal hernia is noted with loops of small bowel within. No obstructive changes are noted. These changes are stable from the recent exam. No free fluid is seen. Musculoskeletal: Degenerative changes of lumbar spine are seen. No acute bony abnormality is noted. Review of the MIP images confirms the above findings. IMPRESSION: No evidence of aortic aneurysmal dilatation or dissection. No evidence of pulmonary emboli. Stable bilateral pulmonary nodules consistent with a benign etiology given their long-term stability. Diffuse diverticular change without diverticulitis. Right inguinal hernia containing loops of ileum without obstructive change. Multiple bilateral renal cysts. No acute abnormality is seen. Electronically Signed   By: Inez Catalina M.D.   On: 06/04/2018 20:00    Procedures Procedures (including critical care time)  Medications Ordered in ED Medications  iopamidol (ISOVUE-370) 76 % injection (has no  administration in time range)  nitroGLYCERIN (NITROSTAT) SL tablet 0.4 mg (has no administration in time range)  acetaminophen (TYLENOL) tablet 650 mg (has no administration in time range)  ondansetron (ZOFRAN) injection 4 mg (has no administration in time range)  enoxaparin (LOVENOX) injection 40 mg (has no administration in time range)  aspirin EC tablet 81 mg (has no administration in time range)  clopidogrel (PLAVIX) tablet 75 mg (has no administration in time range)  furosemide (LASIX) tablet 20 mg (has no administration in time range)  losartan (COZAAR) tablet 25 mg (has no administration in time range)  Melatonin TABS 3 mg (has no administration in time range)  simvastatin (ZOCOR) tablet 20 mg (20 mg Oral Given 06/05/18 0022)  iopamidol (ISOVUE-370) 76 % injection 100 mL (100 mLs Intravenous Contrast Given 06/04/18 1915)  morphine 2 MG/ML injection 2 mg (2 mg Intravenous Given 06/04/18 1957)     Initial Impression / Assessment and Plan / ED Course  I have reviewed the triage vital signs and the nursing notes.  Pertinent labs & imaging results that were available during my care of the patient were reviewed by me and considered in my medical decision making (see chart for details).     Patient is 84 year old male with significant risk factors and recent TAVR who presents with chest pain.  He has some mild associated shortness of breath.  Given that the pain was left-sided and radiated to his shoulder blade, CT Angie was performed which shows no evidence of dissection.  No evidence of PE.  There is no suggestions of fluid overload.  There is no ischemic changes on EKG.  His first troponin is negative.  I spoke with the cardiology fellow who will admit the patient for further treatment.  Final Clinical Impressions(s) / ED Diagnoses   Final diagnoses:  Chest pain, unspecified type    ED Discharge Orders    None       Carmita Boom,  Threasa Beards, MD 06/05/18 [id number]

## 2018-06-04 NOTE — ED Notes (Signed)
Spoke with family member about the long wait to see a provider.  Family member assured that the blood and tests ordered were done and they will be able to talk to a provider as soon as they can be placed in a room.  Family member okay with the process.

## 2018-06-04 NOTE — H&P (Signed)
Cardiology Admission History and Physical:   Patient ID: [name]; MRN: [id number]; DOB: [birth year]   Admission date: 06/04/2018  Primary Care Provider:  Lajean Manes, MD Primary Cardiologist:  Dr.Skains/ Dr. Angelena Form & Dr. Roxy Manns (TAVR)   Chief Complaint:  Left sided chest pain  History of Present Illness:   [name] is a 84 y.o. male with a history of severe aortic stenosis s/p TAVR (with Edwards Sapien 3 - 61mm valve on 03/27/2018), non-obstructive CAD (per cath on 02/06/18), sinus bradycardia, hypertension, BPH, depression, GERD, rheumatoid arthritis (on methotrexate), and hyperlipidemia who presents to the hospital with complaints of constant left sided chest pain and off and on shortness of breath. The pain is reproducible on palpation and there is also a pleuritic component to the discomfort. There are no fevers or chills. The pain is not related to exertion. He had some elevated blood pressures recently and felt better after taking a diuretic. The chest pain also radiates to the right shoulder blade. A CTA in the ED ruled out an aortic dissection. He denies any orthopnea, paroxysmal nocturnal dyspnea or lower extremity edema. He denies any productive cough. He states compliance with all of his medications.  In the ED the initial troponin was 0.0 with a BNP of 45.6.  The ECG revealed sinus bradycardia, rate 55 bpm, with no ischemic ST-T wave changes.  Recent Cardiac imaging/work-up Echo 03/28/18  - Left ventricle: The cavity size was normal. There was moderate concentric hypertrophy. Systolic function was normal. The estimated ejection fraction was in the range of 60% to 65%. There was dynamic obstruction at rest, with a peak velocity of 200 cm/sec and a peak gradient of 16 mm Hg. Wall motion was normal; there were no regional wall motion abnormalities. Doppler parameters are consistent with abnormal left ventricular relaxation (grade 1 diastolic  dysfunction). Doppler parameters are consistent with indeterminate ventricular filling pressure. - Aortic valve: A 29mm Edwards Sapien TAVR bioprosthesis was present and well-seated. Transvalvular velocity was mildly increased. Peak velocity (S): 288 cm/s. Mean gradient (S): 15 mm Hg. Valve area (VTI): 2.17 cm^2. Valve area (Vmax): 1.94 cm^2. Valve area (Vmean): 2.09 cm^2. - Mitral valve: Transvalvular velocity was within the normal range. There was no evidence for stenosis. There was no regurgitation. - Left atrium: The atrium was moderately dilated. - Right ventricle: The cavity size was normal. Wall thickness was normal. Systolic function was normal. - Tricuspid valve: There was mild regurgitation. - Pulmonary arteries: Systolic pressure was within the normal range. - Pericardium, extracardiac: A trivial pericardial effusion was identified.  Echo 04/25/18 - Left ventricle: The cavity size was normal. There was mild concentric hypertrophy. Systolic function was normal. The estimated ejection fraction was in the range of 60% to 65%. Wall motion was normal; there were no regional wall motion abnormalities. Doppler parameters are consistent with abnormal left ventricular relaxation (grade 1 diastolic dysfunction). - Aortic valve: S/P TAVR. A prosthesis was present and functioning normally. The prosthesis had a normal range of motion. The sewing ring appeared normal, had no rocking motion, and showed no evidence of dehiscence. There was moderate perivalvular regurgitation. Mean gradient (S): 14 mm Hg. Regurgitation pressure half-time: 386 ms. - Left atrium: The atrium was mildly dilated. - Atrial septum: No defect or patent foramen ovale was identified. - Pulmonary arteries: PA peak pressure: 32 mm Hg (S). Impressions: - S/P TAVR. On this study, there is moderate aortic regurgitation that appears most likel perivalvular in nature. It was not  seen on prior  study. Mean gradient is similar to prior.  Cardiac catheterization - 02/06/18 1. Mild non-obstructive CAD 2. Severe aortic stenosis (mean gradient 54.9 mmHg, peak to peak gradient 53 mmHg, AVA 0.60 cm2) 3. Mean wedge pressure 12 mmHg     Past Medical History:  Diagnosis Date  . BPH (benign prostatic hypertrophy)   . Depression   . Diverticulosis of colon   . GERD (gastroesophageal reflux disease)   . Gilbert's syndrome   . History of kidney stones   . HLD (hyperlipidemia)   . HOH (hard of hearing)    refuses to wear his Hearing Aid  . Hypertension   . Inguinal hernia    right  . Internal hemorrhoid   . Normal coronary arteries    a. Cath 07/2010: normal coronaries. Felt to have noncardiac CP/SOB at that time possibly r/t anxiety.  . RA (rheumatoid arthritis) ([place])    bilateral hands/ wrist--  seronegative  . S/P TAVR (transcatheter aortic valve replacement) 03/27/2018   23 mm Edwards Sapien 3 transcatheter heart valve placed via percutaneous right transfemoral approach   . Severe aortic stenosis   . Thyroid nodule    noted 11-2009  . Wears dentures     Past Surgical History:  Procedure Laterality Date  . CARDIAC CATHETERIZATION  07-19-2010  dr Tressia Miners turner   normal coronary arteries,  ef 60%,  moderate aortic stenosis- gradient 57mmHg, normal right heart pressure  . CARDIOVASCULAR STRESS TEST  05/ 2011   dr Marlou Porch   normal low risk perfusion study  . CATARACT EXTRACTION W/ INTRAOCULAR LENS  IMPLANT, BILATERAL  2013  . CATARACT EXTRACTION, BILATERAL    . COLONOSCOPY  2010  approx  . INTRAOPERATIVE TRANSTHORACIC ECHOCARDIOGRAM N/A 03/27/2018   Procedure: INTRAOPERATIVE TRANSTHORACIC ECHOCARDIOGRAM;  Surgeon: Burnell Blanks, MD;  Location: [facility];  Service: Open Heart Surgery;  Laterality: N/A;  . RIGHT/LEFT HEART CATH AND CORONARY ANGIOGRAPHY N/A 02/06/2018   Procedure: RIGHT/LEFT HEART CATH AND CORONARY ANGIOGRAPHY;  Surgeon: Burnell Blanks, MD;  Location: [facility];  Service: Cardiovascular;  Laterality: N/A;  . THYROID LOBECTOMY Right 05/05/2015   Procedure: RIGHT THYROID LOBECTOMY;  Surgeon: Armandina Gemma, MD;  Location: [facility];  Service: General;  Laterality: Right;  . TRANSANAL HEMORRHOIDAL DEARTERIALIZATION N/A 04/09/2014   Procedure: TRANSANAL HEMORRHOIDAL DEARTERIALIZATION OF INTERNAL HEMORROIDS;  Surgeon: Leighton Ruff, MD;  Location: [facility];  Service: General;  Laterality: N/A;  . TRANSCATHETER AORTIC VALVE REPLACEMENT, TRANSFEMORAL  03/27/2018  . TRANSCATHETER AORTIC VALVE REPLACEMENT, TRANSFEMORAL N/A 03/27/2018   Procedure: TRANSCATHETER AORTIC VALVE REPLACEMENT, TRANSFEMORAL. Edwards SAPIEN 3 Transcatheter Heart Valve 44mm.;  Surgeon: Burnell Blanks, MD;  Location: [place];  Service: Open Heart Surgery;  Laterality: N/A;  . TRANSTHORACIC ECHOCARDIOGRAM  11-26-2013   moderate focal basal and mild LVH/  ef 37-85%/  grade I diastolic dysfunction/ mild LAE/  moderate calcification with stenosis AV with mild regurg,  gradients 35 abd 58 mmHg /  mild TR     Medications Prior to Admission: Prior to Admission medications   Medication Sig Start Date End Date Taking? Authorizing Provider  amoxicillin (AMOXIL) 500 MG tablet Take 4 tablets (2,000 mg) 1 hour prior to dental visits. 04/25/18   Eileen Stanford, PA-C  Ascorbic Acid (VITAMIN C) 1000 MG tablet Take 1,000 mg by mouth daily after lunch.    [provider]  aspirin 81 MG tablet Take 81 mg by mouth daily.    [provider]  clopidogrel (  PLAVIX) 75 MG tablet Take 1 tablet (75 mg total) by mouth daily with breakfast. 03/29/18   Eileen Stanford, PA-C  cyclobenzaprine (FLEXERIL) 5 MG tablet Take 1 tablet (5 mg total) by mouth 3 (three) times daily. 09/06/16   Angiulli, Lavon Paganini, PA-C  docusate sodium (COLACE) 100 MG capsule Take 100 mg by mouth daily.    [provider]  dutasteride (AVODART) 0.5 MG capsule  Take 0.5 mg by mouth daily.    [provider]  escitalopram (LEXAPRO) 20 MG tablet Take 1 tablet (20 mg total) by mouth daily. 09/06/16   Angiulli, Lavon Paganini, PA-C  folic acid (FOLVITE) 1 MG tablet Take 1 mg by mouth daily after lunch.  01/27/15   [provider]  furosemide (LASIX) 20 MG tablet Take 1 tablet (20 mg total) by mouth daily. 02/07/18 02/07/19  Elodia Florence., MD  linaclotide [facility]) 145 MCG CAPS capsule Take 1 capsule (145 mcg total) by mouth daily. 09/07/16   Angiulli, Lavon Paganini, PA-C  losartan (COZAAR) 25 MG tablet Take 25 mg by mouth daily.  01/21/18   [provider]  Melatonin 10 MG TABS Take 10 mg by mouth at bedtime as needed (for sleep).     [provider]  methotrexate (RHEUMATREX) 2.5 MG tablet Take 22.5 mg by mouth every Sunday.  07/13/13   [provider]  Omega-3 Fatty Acids (FISH OIL) 1000 MG CAPS Take 1,000 mg by mouth daily.    [provider]  polyethylene glycol (MIRALAX / GLYCOLAX) packet Take 17 g by mouth daily. 09/07/16   Angiulli, Lavon Paganini, PA-C  simvastatin (ZOCOR) 20 MG tablet TAKE 1 TABLET BY MOUTH EVERY NIGHT AT BEDTIME 05/06/14   Jerline Pain, MD  traMADol (ULTRAM) 50 MG tablet Take 1 tablet (50 mg total) by mouth every 6 (six) hours. 09/06/16   Angiulli, Lavon Paganini, PA-C  vitamin E 400 UNIT capsule Take 400 Units by mouth daily after lunch.    [provider]     Allergies:    Allergies  Allergen Reactions  . Ativan [Lorazepam] Other (See Comments)    hallucinations  . Phenergan [Promethazine Hcl] Hypertension  . Eggs Or Egg-Derived Products Rash    Small rash after eating for several days in a row    Social History:   Social History   Socioeconomic History  . Marital status: Widowed    Spouse name: Not on file  . Number of children: Not on file  . Years of education: Not on file  . Highest education level: Not on file  Occupational History  . Not on file  Social Needs  .  Financial resource strain: Not on file  . Food insecurity:    Worry: Not on file    Inability: Not on file  . Transportation needs:    Medical: Not on file    Non-medical: Not on file  Tobacco Use  . Smoking status: Former Smoker    Years: 40.00    Types: Cigarettes    Start date: 06/13/1973    Last attempt to quit: 04/03/1981    Years since quitting: 37.1  . Smokeless tobacco: Never Used  Substance and Sexual Activity  . Alcohol use: Yes    Comment: occasional  . Drug use: No  . Sexual activity: Not on file  Lifestyle  . Physical activity:    Days per week: Not on file    Minutes per session: Not on file  . Stress:  Not on file  Relationships  . Social connections:    Talks on phone: Not on file    Gets together: Not on file    Attends religious service: Not on file    Active member of club or organization: Not on file    Attends meetings of clubs or organizations: Not on file    Relationship status: Not on file  . Intimate partner violence:    Fear of current or ex partner: Not on file    Emotionally abused: Not on file    Physically abused: Not on file    Forced sexual activity: Not on file  Other Topics Concern  . Not on file  Social History Narrative  . Not on file     Family History:   The patient's family history includes Breast cancer in his sister; CVA in his father; Diabetes in his brother and brother; Pulmonary embolism (age of onset: 39) in his mother.     Review of Systems: [y] = yes, [ ]  = no   . General: Weight gain [ ] ; Weight loss [ ] ; Anorexia [ ] ; Fatigue [y]; Fever [ ] ; Chills [ ] ; Weakness [ ]   . Cardiac: Chest pain/pressure [y]; Resting SOB [y]; Exertional SOB [ ] ; Orthopnea [ ] ; Pedal Edema [ ] ; Palpitations [ ] ; Syncope [ ] ; Presyncope [ ] ; Paroxysmal nocturnal dyspnea[ ]   . Pulmonary: Cough [ ] ; Wheezing[ ] ; Hemoptysis[ ] ; Sputum [ ] ; Snoring [ ]   . GI: Vomiting[ ] ; Dysphagia[ ] ; Melena[ ] ; Hematochezia [ ] ; Heartburn[ ] ; Abdominal pain [ ] ;  Constipation [ ] ; Diarrhea [ ] ; BRBPR [ ]   . GU: Hematuria[ ] ; Dysuria [ ] ; Nocturia[ ]   . Vascular: Pain in legs with walking [ ] ; Pain in feet with lying flat [ ] ; Non-healing sores [ ] ; Stroke [ ] ; TIA [ ] ; Slurred speech [ ] ;  . Neuro: Headaches[ ] ; Vertigo[ ] ; Seizures[ ] ; Paresthesias[ ] ;Blurred vision [ ] ; Diplopia [ ] ; Vision changes [ ]   . Ortho/Skin: Arthritis [ ] ; Joint pain [ ] ; Muscle pain [ ] ; Joint swelling [ ] ; Back Pain [ ] ; Rash [ ]   . Psych: Depression[ ] ; Anxiety[ ]   . Heme: Bleeding problems [ ] ; Clotting disorders [ ] ; Anemia [ ]   . Endocrine: Diabetes [ ] ; Thyroid dysfunction[ ]      Physical Exam/Data:   Vitals:   06/04/18 1830 06/04/18 1845 06/04/18 1915 06/04/18 2015  BP: (!) 156/48 (!) 150/53 (!) 161/56 (!) 145/66  Pulse: (!) 56 (!) 54 (!) 55 (!) 56  Resp: 18 20 18 16   Temp:      TempSrc:      SpO2: 100% 99% 99% 97%  Weight:      Height:       No intake or output data in the 24 hours ending 06/04/18 2053 Filed Weights   06/04/18 1320  Weight: 74.4 kg   Body mass index is 26.05 kg/m.  General:  Well nourished, well developed, in no acute distress HEENT: normal Lymph: no adenopathy Neck: no JVD Endocrine:  No thryomegaly Vascular: No carotid bruits; FA pulses 2+ bilaterally without bruits  Cardiac:  normal S1, S2; 2/6 systolic murmur;   Lungs:  clear to auscultation bilaterally, no wheezing, rhonchi or rales  Abd: soft, nontender, no hepatomegaly  Ext: no edema Musculoskeletal:  No deformities, BUE and BLE strength normal and equal Skin: warm and dry  Neuro:  CNs 2-12 intact, no focal abnormalities noted Psych:  Normal affect  EKG:  The ECG that was done 06/04/18 and was personally reviewed and demonstrates sinus bradycardia, rate 55 bpm, no ischemic ST-T wave changes.   Laboratory Data:  Chemistry Recent Labs  Lab 06/04/18 1326  NA 140  K 4.1  CL 105  CO2 27  GLUCOSE 105*  BUN 19  CREATININE 1.11  CALCIUM 9.1  GFRNONAA 59*    GFRAA >60  ANIONGAP 8    No results for input(s): PROT, ALBUMIN, AST, ALT, ALKPHOS, BILITOT in the last 168 hours. Hematology Recent Labs  Lab 06/04/18 1326  WBC 6.1  RBC 4.54  HGB 13.5  HCT 42.7  MCV 94.1  MCH 29.7  MCHC 31.6  RDW 13.0  PLT 133*   Cardiac EnzymesNo results for input(s): TROPONINI in the last 168 hours.  Recent Labs  Lab 06/04/18 1332  TROPIPOC 0.00    BNP Recent Labs  Lab 06/04/18 1950  BNP 45.6    DDimer No results for input(s): DDIMER in the last 168 hours.  Radiology/Studies:  Dg Chest 2 View - Result Date: 06/04/2018 FINDINGS: Since the prior portable examination the right IJ central line has been removed. Lung volumes and mediastinal contours are within normal limits. Sequelae of TAVR. Visualized tracheal air column is within normal limits. Both lungs appear clear. No pneumothorax, pulmonary edema, or pleural effusion. No acute osseous abnormality identified. Chronic surgical clips at the right thoracic inlet probably related to thyroidectomy. Negative visible bowel gas pattern. IMPRESSION: No acute cardiopulmonary abnormality   Ct Angio Chest/abd/pel For Dissection W And/or W/wo - Result Date: 06/04/2018 FINDINGS:  CTA CHEST FINDINGS Cardiovascular: Changes of prior TAVR are noted. Heart is at the upper limits of normal in size. Thoracic aorta demonstrates atherosclerotic calcification without aneurysmal dilatation or dissection. Pulmonary artery shows a normal branching pattern. No pulmonary emboli are seen. Coronary calcifications are noted. Mediastinum/Nodes: Thoracic inlet demonstrates changes consistent with hemithyroidectomy on the right. No significant lymphadenopathy is noted. No mediastinal or hilar adenopathy is seen. Some calcified right hilar nodes are noted consistent with prior granulomatous disease. The esophagus is within normal limits. Lungs/Pleura: Lungs are well aerated bilaterally. Right lower lobe and left upper lobe nodules are  again seen and stable from the prior exam and again felt to be benign in etiology. No new nodules are seen. No focal infiltrate or sizable effusion is seen. Musculoskeletal: Degenerative changes of the thoracic spine are noted. No acute bony abnormality is seen. Review of the MIP images confirms the above findings.  CTA ABDOMEN AND PELVIS FINDINGS VASCULAR Aorta: Abdominal aorta demonstrates mild atherosclerotic calcifications without aneurysmal dilatation or dissection. Celiac: Patent without evidence of aneurysm, dissection, vasculitis or significant stenosis. SMA: Patent without evidence of aneurysm, dissection, vasculitis or significant stenosis. Renals: Single renal arteries are identified bilaterally. No significant stenosis is seen. IMA: Patent without evidence of aneurysm, dissection, vasculitis or significant stenosis. Iliacs: Mild calcifications are noted without aneurysmal dilatation or dissection. Veins: No specific venous abnormality is noted. Review of the MIP images confirms the above findings.  NON-VASCULAR Hepatobiliary: No focal liver abnormality is seen. No gallstones, gallbladder wall thickening, or biliary dilatation. Pancreas: Unremarkable. No pancreatic ductal dilatation or surrounding inflammatory changes. Spleen: Normal in size without focal abnormality. Adrenals/Urinary Tract: The adrenal glands are within normal limits bilaterally. Bilateral renal cysts are noted. The largest of these lies on the right and measures 3.5 cm in greatest dimension. No renal calculi or obstructive changes are seen. The bladder is well distended. Circumaortic left renal vein is  noted. Stomach/Bowel: Diffuse diverticular change of the sigmoid colon is noted. No evidence of diverticulitis is seen. The appendix is well visualized and within normal limits. No obstructive or inflammatory changes of the bowel are seen. The stomach is unremarkable. Lymphatic: No significant lymphadenopathy is identified.  Reproductive: Prostate is unremarkable. Other: Right inguinal hernia is noted with loops of small bowel within. No obstructive changes are noted. These changes are stable from the recent exam. No free fluid is seen. Musculoskeletal: Degenerative changes of lumbar spine are seen. No acute bony abnormality is noted. Review of the MIP images confirms the above findings. IMPRESSION: No evidence of aortic aneurysmal dilatation or dissection. No evidence of pulmonary emboli. Stable bilateral pulmonary nodules consistent with a benign etiology given their long-term stability. Diffuse diverticular change without diverticulitis. Right inguinal hernia containing loops of ileum without obstructive change. Multiple bilateral renal cysts. No acute abnormality is seen.   Assessment and Plan:   1. Left sided chest pain and shortness of breath  The initial troponin is 0.0 and the BNP is 45.6. Chest pain is atypical and likely multifactorial in etiology. Also consider musculoskeletal origin. CTA reveals no evidence of aortic dissection. He also appears to be euvolemic on clinical exam.  - Admit to telemetry for observation - Repeat transthoracic echocardiogram to re-assess perivalvular leak seen on last echo - Cycle cardiac enzymes - Repeat serial ECGs - Continue ASA and clopidogrel - Continue maintenance dose Lasix - Continue losartan and simvastatin   Severity of Illness: The appropriate patient status for this patient is INPATIENT. Inpatient status is judged to be reasonable and necessary in order to provide the required intensity of service to ensure the patient's safety. The patient's presenting symptoms, physical exam findings, and initial radiographic and laboratory data in the context of their chronic comorbidities is felt to place them at high risk for further clinical deterioration. Furthermore, it is not anticipated that the patient will be medically stable for discharge from the hospital within 2  midnights of admission. The following factors support the patient status of inpatient.   " The patient's presenting symptoms include chest pain. " The worrisome physical exam findings include systolic murmur. " The initial radiographic and laboratory data are unremarkable. " The chronic co-morbidities include hypertension and hyperlipidemia.   * I certify that at the point of admission it is my clinical judgment that the patient will require inpatient hospital care spanning beyond 2 midnights from the point of admission due to high intensity of service, high risk for further deterioration and high frequency of surveillance required.*    For questions or updates, please contact [facility] Please consult [email or web address] for contact info under Cardiology/STEMI.    Signed, Meade Maw, MD  06/04/2018 8:53 PM

## 2018-06-04 NOTE — ED Notes (Signed)
Pt given [place] sandwich and tea at this time.

## 2018-06-04 NOTE — Telephone Encounter (Signed)
Daughter is calling because patient is having an episode of chest tightness and she would like to see if he can be seen. I checked but have no available appts.

## 2018-06-05 DIAGNOSIS — Z952 Presence of prosthetic heart valve: Secondary | ICD-10-CM

## 2018-06-05 DIAGNOSIS — I10 Essential (primary) hypertension: Secondary | ICD-10-CM | POA: Diagnosis not present

## 2018-06-05 DIAGNOSIS — R0602 Shortness of breath: Secondary | ICD-10-CM | POA: Diagnosis not present

## 2018-06-05 DIAGNOSIS — R0789 Other chest pain: Secondary | ICD-10-CM

## 2018-06-05 DIAGNOSIS — K219 Gastro-esophageal reflux disease without esophagitis: Secondary | ICD-10-CM | POA: Diagnosis not present

## 2018-06-05 DIAGNOSIS — N400 Enlarged prostate without lower urinary tract symptoms: Secondary | ICD-10-CM | POA: Diagnosis not present

## 2018-06-05 DIAGNOSIS — R001 Bradycardia, unspecified: Secondary | ICD-10-CM | POA: Diagnosis not present

## 2018-06-05 DIAGNOSIS — M069 Rheumatoid arthritis, unspecified: Secondary | ICD-10-CM | POA: Diagnosis not present

## 2018-06-05 DIAGNOSIS — E785 Hyperlipidemia, unspecified: Secondary | ICD-10-CM | POA: Diagnosis not present

## 2018-06-05 DIAGNOSIS — I2510 Atherosclerotic heart disease of native coronary artery without angina pectoris: Secondary | ICD-10-CM | POA: Diagnosis not present

## 2018-06-05 DIAGNOSIS — R079 Chest pain, unspecified: Secondary | ICD-10-CM | POA: Diagnosis present

## 2018-06-05 DIAGNOSIS — Z79899 Other long term (current) drug therapy: Secondary | ICD-10-CM | POA: Diagnosis not present

## 2018-06-05 DIAGNOSIS — F329 Major depressive disorder, single episode, unspecified: Secondary | ICD-10-CM | POA: Diagnosis not present

## 2018-06-05 MED ORDER — ASPIRIN 81 MG PO TABS: 81.0000 mg | ORAL_TABLET | Freq: Every day | ORAL | Status: DC

## 2018-06-05 MED ORDER — CYCLOBENZAPRINE HCL 10 MG PO TABS: 5.0000 mg | ORAL_TABLET | Freq: Three times a day (TID) | ORAL | Status: DC | PRN

## 2018-06-05 MED ORDER — METHOCARBAMOL 500 MG PO TABS: 500.0000 mg | ORAL_TABLET | Freq: Three times a day (TID) | ORAL | 0 refills | Status: DC

## 2018-06-05 NOTE — Discharge Summary (Signed)
Discharge Summary    Patient ID: [name],  MRN: [id number], DOB/AGE: 08-15-1927 84 y.o.  Admit date: 06/04/2018 Discharge date: 06/05/2018  Primary Care Provider: Lajean Manes Primary Cardiologist: Candee Furbish, MD TAVR: Lauree Chandler, MD   Discharge Diagnoses    Principal Problem:   Musculoskeletal chest pain Active Problems:   S/P TAVR (transcatheter aortic valve replacement)   Chest pain   Allergies Allergies  Allergen Reactions  . Ativan [Lorazepam] Other (See Comments)    hallucinations  . Phenergan [Promethazine Hcl] Hypertension  . Eggs Or Egg-Derived Products Rash    Small rash after eating for several days in a row    Diagnostic Studies/Procedures    None _____________   History of Present Illness     84 yo male w/ hx non-obstructive CAD on 02/06/18 and TAVR for severe AS on 03/27/18 was admitted 12/23 with chest pains.  Hospital Course     Consultants: None  He was having chest pain and dyspnea.  The dyspnea was related to it hurting more to take a deep breath.  The pain was reproducible over the left chest.  The ER performed a CT angiogram for PE which was negative.  Chest pain was not felt cardiac because he had minimal coronary artery disease by recent cath. BNP was normal.  On 12/24, he was seen by Dr. Debara Pickett and all data were reviewed.  He was ambulating without chest pain or shortness of breath.  No further inpatient work-up was indicated and he is considered stable for discharge, to follow-up as an outpatient. _____________  Discharge Vitals Blood pressure (!) 134/55, pulse 61, temperature 97.8 F (36.6 C), temperature source Oral, resp. rate 14, height 5\' 6"  (1.676 m), weight 76.8 kg, SpO2 99 %.  Filed Weights   06/04/18 1320 06/04/18 2336  Weight: 74.4 kg 76.8 kg    Labs & Radiologic Studies    CBC Recent Labs    06/04/18 1326  WBC 6.1  HGB 13.5  HCT 42.7  MCV 94.1  PLT 102*   Basic Metabolic Panel Recent  Labs    06/04/18 1326  NA 140  K 4.1  CL 105  CO2 27  GLUCOSE 105*  BUN 19  CREATININE 1.11  CALCIUM 9.1   BNP    Component Value Date/Time   BNP 45.6 06/04/2018 1950   _____________  Dg Chest 2 View  Result Date: 06/04/2018 CLINICAL DATA:  84 year old male with left side chest pain radiating to the shoulder blade. Shortness of breath. Recent TAVR. EXAM: CHEST - 2 VIEW COMPARISON:  Portable chest 03/27/2018 and earlier. FINDINGS: Since the prior portable examination the right IJ central line has been removed. Lung volumes and mediastinal contours are within normal limits. Sequelae of TAVR. Visualized tracheal air column is within normal limits. Both lungs appear clear. No pneumothorax, pulmonary edema, or pleural effusion. No acute osseous abnormality identified. Chronic surgical clips at the right thoracic inlet probably related to thyroidectomy. Negative visible bowel gas pattern. IMPRESSION: No acute cardiopulmonary abnormality. Electronically Signed   By: Genevie Ann M.D.   On: 06/04/2018 14:04   Ct Angio Chest/abd/pel For Dissection W And/or W/wo  Result Date: 06/04/2018 CLINICAL DATA:  Left-sided chest pain and shortness of breath EXAM: CT ANGIOGRAPHY CHEST, ABDOMEN AND PELVIS TECHNIQUE: Multidetector CT imaging through the chest, abdomen and pelvis was performed using the standard protocol during bolus administration of intravenous contrast. Multiplanar reconstructed images and MIPs were obtained and reviewed to evaluate the vascular anatomy.  CONTRAST:  163mL ISOVUE-370 IOPAMIDOL (ISOVUE-370) INJECTION 76% COMPARISON:  Plain film from earlier today, CT from 02/07/2018 FINDINGS: CTA CHEST FINDINGS Cardiovascular: Changes of prior TAVR are noted. Heart is at the upper limits of normal in size. Thoracic aorta demonstrates atherosclerotic calcification without aneurysmal dilatation or dissection. Pulmonary artery shows a normal branching pattern. No pulmonary emboli are seen. Coronary  calcifications are noted. Mediastinum/Nodes: Thoracic inlet demonstrates changes consistent with hemithyroidectomy on the right. No significant lymphadenopathy is noted. No mediastinal or hilar adenopathy is seen. Some calcified right hilar nodes are noted consistent with prior granulomatous disease. The esophagus is within normal limits. Lungs/Pleura: Lungs are well aerated bilaterally. Right lower lobe and left upper lobe nodules are again seen and stable from the prior exam and again felt to be benign in etiology. No new nodules are seen. No focal infiltrate or sizable effusion is seen. Musculoskeletal: Degenerative changes of the thoracic spine are noted. No acute bony abnormality is seen. Review of the MIP images confirms the above findings. CTA ABDOMEN AND PELVIS FINDINGS VASCULAR Aorta: Abdominal aorta demonstrates mild atherosclerotic calcifications without aneurysmal dilatation or dissection. Celiac: Patent without evidence of aneurysm, dissection, vasculitis or significant stenosis. SMA: Patent without evidence of aneurysm, dissection, vasculitis or significant stenosis. Renals: Single renal arteries are identified bilaterally. No significant stenosis is seen. IMA: Patent without evidence of aneurysm, dissection, vasculitis or significant stenosis. Iliacs: Mild calcifications are noted without aneurysmal dilatation or dissection. Veins: No specific venous abnormality is noted. Review of the MIP images confirms the above findings. NON-VASCULAR Hepatobiliary: No focal liver abnormality is seen. No gallstones, gallbladder wall thickening, or biliary dilatation. Pancreas: Unremarkable. No pancreatic ductal dilatation or surrounding inflammatory changes. Spleen: Normal in size without focal abnormality. Adrenals/Urinary Tract: The adrenal glands are within normal limits bilaterally. Bilateral renal cysts are noted. The largest of these lies on the right and measures 3.5 cm in greatest dimension. No renal  calculi or obstructive changes are seen. The bladder is well distended. Circumaortic left renal vein is noted. Stomach/Bowel: Diffuse diverticular change of the sigmoid colon is noted. No evidence of diverticulitis is seen. The appendix is well visualized and within normal limits. No obstructive or inflammatory changes of the bowel are seen. The stomach is unremarkable. Lymphatic: No significant lymphadenopathy is identified. Reproductive: Prostate is unremarkable. Other: Right inguinal hernia is noted with loops of small bowel within. No obstructive changes are noted. These changes are stable from the recent exam. No free fluid is seen. Musculoskeletal: Degenerative changes of lumbar spine are seen. No acute bony abnormality is noted. Review of the MIP images confirms the above findings. IMPRESSION: No evidence of aortic aneurysmal dilatation or dissection. No evidence of pulmonary emboli. Stable bilateral pulmonary nodules consistent with a benign etiology given their long-term stability. Diffuse diverticular change without diverticulitis. Right inguinal hernia containing loops of ileum without obstructive change. Multiple bilateral renal cysts. No acute abnormality is seen. Electronically Signed   By: Inez Catalina M.D.   On: 06/04/2018 20:00   Disposition   Pt is being discharged home today in good condition.  Follow-up Plans & Appointments    Follow-up Information    Jerline Pain, MD Follow up.   Specialty:  Cardiology Why:  Keep scheduled appointment in February. Call if you need to be seen sooner Contact information: [address] [place] [address] [phone]          Discharge Instructions    Diet - low sodium heart healthy  Complete by:  As directed    Increase activity slowly   Complete by:  As directed       Discharge Medications   Allergies as of 06/05/2018      Reactions   Ativan [lorazepam] Other (See Comments)   hallucinations   Phenergan  [promethazine Hcl] Hypertension   Eggs Or Egg-derived Products Rash   Small rash after eating for several days in a row      Medication List    STOP taking these medications   linaclotide 145 MCG Caps capsule Commonly known as:  LINZESS   polyethylene glycol packet Commonly known as:  MIRALAX / GLYCOLAX     TAKE these medications   amoxicillin 500 MG tablet Commonly known as:  AMOXIL Take 4 tablets (2,000 mg) 1 hour prior to dental visits.   aspirin 81 MG tablet Take 1 tablet (81 mg total) by mouth daily.   clopidogrel 75 MG tablet Commonly known as:  PLAVIX Take 1 tablet (75 mg total) by mouth daily with breakfast.   dutasteride 0.5 MG capsule Commonly known as:  AVODART Take 0.5 mg by mouth daily.   escitalopram 20 MG tablet Commonly known as:  LEXAPRO Take 1 tablet (20 mg total) by mouth daily.   Fish Oil 1000 MG Caps Take 1,000 mg by mouth daily.   folic acid 1 MG tablet Commonly known as:  FOLVITE Take 1 mg by mouth daily after lunch.   furosemide 20 MG tablet Commonly known as:  LASIX Take 1 tablet (20 mg total) by mouth daily.   ibuprofen 800 MG tablet Commonly known as:  ADVIL,MOTRIN Take 800 mg by mouth every morning.   losartan 25 MG tablet Commonly known as:  COZAAR Take 25 mg by mouth daily.   Melatonin 10 MG Tabs Take 10 mg by mouth at bedtime as needed (for sleep).   methocarbamol 500 MG tablet Commonly known as:  ROBAXIN Take 1 tablet (500 mg total) by mouth 3 (three) times daily.   methotrexate 2.5 MG tablet Commonly known as:  RHEUMATREX Take 22.5 mg by mouth every Sunday.   simvastatin 20 MG tablet Commonly known as:  ZOCOR TAKE 1 TABLET BY MOUTH EVERY NIGHT AT BEDTIME What changed:  when to take this   traMADol 50 MG tablet Commonly known as:  ULTRAM Take 1 tablet (50 mg total) by mouth every 6 (six) hours. What changed:    when to take this  reasons to take this   vitamin C 1000 MG tablet Take 1,000 mg by mouth daily  after lunch.   vitamin E 400 UNIT capsule Take 400 Units by mouth daily after lunch.         Outstanding Labs/Studies   None  Duration of Discharge Encounter   Greater than 30 minutes including physician time.  Ryan Drought  NP 06/05/2018, 11:56 AM

## 2018-06-05 NOTE — Care Management Obs Status (Signed)
[facility] NOTIFICATION   Patient Details  Name: [name] MRN: [id number] Date of Birth: [birth year]   Medicare Observation Status Notification Given:  Yes    Dawayne Patricia, RN 06/05/2018, 10:54 AM

## 2018-06-05 NOTE — Progress Notes (Signed)
Discharge AVS meds take and those due reviewed with pt. Follow up appointments and when to call MD reviewed. All questions and concerns addressed. No further questions at this time. D/c IV and TELE, CCMD notified. D/C home per orders. Brought down via wheelchair with family and RN.  Amanda Cockayne, RN

## 2018-06-05 NOTE — Progress Notes (Signed)
DAILY PROGRESS NOTE   Patient Name: [name] Date of Encounter: 06/05/2018  Chief Complaint   No complaints  Patient Profile   84 yo male with chest pain and dyspnea, s/p recent LHC with non-obstructive CAD on 02/06/18 and TAVR for severe AS on 03/27/18.  Subjective   Presented with chest pain and dyspnea. Pain was reproducible over the left chest and into the shoulder blade, hurt to take a deep breath - so was dyspneic. CTA negative for dissection or PE as pain radiated to shoulder blade. Not likely coronary with minimal CAD by cath on 8/27. Initial trop negative - BNP normal. EKG sinus at 55. Labs otherwise normal. Able to lay flat today- no further chest pain or dyspnea. Daughter reports significant anxiety.  Objective   Vitals:   06/05/18 0200 06/05/18 0449 06/05/18 0450 06/05/18 0901  BP:  (!) 143/67 (!) 143/67 (!) 134/55  Pulse: (!) 51 (!) 59 70 61  Resp: 15 (!) 26 14 14   Temp:  97.8 F (36.6 C)  97.8 F (36.6 C)  TempSrc:  Oral  Oral  SpO2: 97% 98% 99% 99%  Weight:      Height:        Intake/Output Summary (Last 24 hours) at 06/05/2018 9562 Last data filed at 06/05/2018 0450 Gross per 24 hour  Intake -  Output 325 ml  Net -325 ml   Filed Weights   06/04/18 1320 06/04/18 2336  Weight: 74.4 kg 76.8 kg    Physical Exam   General appearance: alert and no distress Lungs: clear to auscultation bilaterally Heart: regular rate and rhythm, S1, S2 normal and diastolic murmur: early diastolic 2/6, blowing at apex Extremities: extremities normal, atraumatic, no cyanosis or edema Neurologic: Grossly normal  Inpatient Medications    Scheduled Meds: . aspirin EC  81 mg Oral Daily  . clopidogrel  75 mg Oral Q breakfast  . enoxaparin (LOVENOX) injection  40 mg Subcutaneous Daily  . furosemide  20 mg Oral Daily  . losartan  25 mg Oral Daily  . simvastatin  20 mg Oral QHS    Continuous Infusions:   PRN Meds: acetaminophen, Melatonin, nitroGLYCERIN,  ondansetron (ZOFRAN) IV   Labs   Results for orders placed or performed during the hospital encounter of 06/04/18 (from the past 48 hour(s))  Basic metabolic panel     Status: Abnormal   Collection Time: 06/04/18  1:26 PM  Result Value Ref Range   Sodium 140 135 - 145 mmol/L   Potassium 4.1 3.5 - 5.1 mmol/L   Chloride 105 98 - 111 mmol/L   CO2 27 22 - 32 mmol/L   Glucose, Bld 105 (H) 70 - 99 mg/dL   BUN 19 8 - 23 mg/dL   Creatinine, Ser 1.11 0.61 - 1.24 mg/dL   Calcium 9.1 8.9 - 10.3 mg/dL   GFR calc non Af Amer 59 (L) >60 mL/min   GFR calc Af Amer >60 >60 mL/min   Anion gap 8 5 - 15    Comment: Performed at [facility] [address] [place] [address]  CBC     Status: Abnormal   Collection Time: 06/04/18  1:26 PM  Result Value Ref Range   WBC 6.1 4.0 - 10.5 K/uL   RBC 4.54 4.22 - 5.81 MIL/uL   Hemoglobin 13.5 13.0 - 17.0 g/dL   HCT 42.7 39.0 - 52.0 %   MCV 94.1 80.0 - 100.0 fL   MCH 29.7 26.0 - 34.0  pg   MCHC 31.6 30.0 - 36.0 g/dL   RDW 13.0 11.5 - 15.5 %   Platelets 133 (L) 150 - 400 K/uL   nRBC 0.0 0.0 - 0.2 %    Comment: Performed at [facility], [place] [address] [place], [place] [address]  Protime-INR (order if Patient is taking Coumadin / Warfarin)     Status: None   Collection Time: 06/04/18  1:26 PM  Result Value Ref Range   Prothrombin Time 13.4 11.4 - 15.2 seconds   INR 1.03     Comment: Performed at [facility] [address], [place], [place] [address]  I-stat troponin, ED     Status: None   Collection Time: 06/04/18  1:32 PM  Result Value Ref Range   Troponin i, poc 0.00 0.00 - 0.08 ng/mL   Comment 3            Comment: Due to the release kinetics of cTnI, a negative result within the first hours of the onset of symptoms does not rule out myocardial infarction with certainty. If myocardial infarction is still suspected, repeat the test at appropriate intervals.   Brain natriuretic peptide     Status: None   Collection  Time: 06/04/18  7:50 PM  Result Value Ref Range   B Natriuretic Peptide 45.6 0.0 - 100.0 pg/mL    Comment: Performed at [facility], [place] [address], [place], [place] [address]    ECG   Sinus bradycardia at 49 - Personally Reviewed  Telemetry   Sinus bradycardia  - Personally Reviewed  Radiology    Dg Chest 2 View  Result Date: 06/04/2018 CLINICAL DATA:  84 year old male with left side chest pain radiating to the shoulder blade. Shortness of breath. Recent TAVR. EXAM: CHEST - 2 VIEW COMPARISON:  Portable chest 03/27/2018 and earlier. FINDINGS: Since the prior portable examination the right IJ central line has been removed. Lung volumes and mediastinal contours are within normal limits. Sequelae of TAVR. Visualized tracheal air column is within normal limits. Both lungs appear clear. No pneumothorax, pulmonary edema, or pleural effusion. No acute osseous abnormality identified. Chronic surgical clips at the right thoracic inlet probably related to thyroidectomy. Negative visible bowel gas pattern. IMPRESSION: No acute cardiopulmonary abnormality. Electronically Signed   By: Genevie Ann M.D.   On: 06/04/2018 14:04   Ct Angio Chest/abd/pel For Dissection W And/or W/wo  Result Date: 06/04/2018 CLINICAL DATA:  Left-sided chest pain and shortness of breath EXAM: CT ANGIOGRAPHY CHEST, ABDOMEN AND PELVIS TECHNIQUE: Multidetector CT imaging through the chest, abdomen and pelvis was performed using the standard protocol during bolus administration of intravenous contrast. Multiplanar reconstructed images and MIPs were obtained and reviewed to evaluate the vascular anatomy. CONTRAST:  193mL ISOVUE-370 IOPAMIDOL (ISOVUE-370) INJECTION 76% COMPARISON:  Plain film from earlier today, CT from 02/07/2018 FINDINGS: CTA CHEST FINDINGS Cardiovascular: Changes of prior TAVR are noted. Heart is at the upper limits of normal in size. Thoracic aorta demonstrates atherosclerotic calcification without aneurysmal  dilatation or dissection. Pulmonary artery shows a normal branching pattern. No pulmonary emboli are seen. Coronary calcifications are noted. Mediastinum/Nodes: Thoracic inlet demonstrates changes consistent with hemithyroidectomy on the right. No significant lymphadenopathy is noted. No mediastinal or hilar adenopathy is seen. Some calcified right hilar nodes are noted consistent with prior granulomatous disease. The esophagus is within normal limits. Lungs/Pleura: Lungs are well aerated bilaterally. Right lower lobe and left upper lobe nodules are again seen and stable from the  prior exam and again felt to be benign in etiology. No new nodules are seen. No focal infiltrate or sizable effusion is seen. Musculoskeletal: Degenerative changes of the thoracic spine are noted. No acute bony abnormality is seen. Review of the MIP images confirms the above findings. CTA ABDOMEN AND PELVIS FINDINGS VASCULAR Aorta: Abdominal aorta demonstrates mild atherosclerotic calcifications without aneurysmal dilatation or dissection. Celiac: Patent without evidence of aneurysm, dissection, vasculitis or significant stenosis. SMA: Patent without evidence of aneurysm, dissection, vasculitis or significant stenosis. Renals: Single renal arteries are identified bilaterally. No significant stenosis is seen. IMA: Patent without evidence of aneurysm, dissection, vasculitis or significant stenosis. Iliacs: Mild calcifications are noted without aneurysmal dilatation or dissection. Veins: No specific venous abnormality is noted. Review of the MIP images confirms the above findings. NON-VASCULAR Hepatobiliary: No focal liver abnormality is seen. No gallstones, gallbladder wall thickening, or biliary dilatation. Pancreas: Unremarkable. No pancreatic ductal dilatation or surrounding inflammatory changes. Spleen: Normal in size without focal abnormality. Adrenals/Urinary Tract: The adrenal glands are within normal limits bilaterally. Bilateral  renal cysts are noted. The largest of these lies on the right and measures 3.5 cm in greatest dimension. No renal calculi or obstructive changes are seen. The bladder is well distended. Circumaortic left renal vein is noted. Stomach/Bowel: Diffuse diverticular change of the sigmoid colon is noted. No evidence of diverticulitis is seen. The appendix is well visualized and within normal limits. No obstructive or inflammatory changes of the bowel are seen. The stomach is unremarkable. Lymphatic: No significant lymphadenopathy is identified. Reproductive: Prostate is unremarkable. Other: Right inguinal hernia is noted with loops of small bowel within. No obstructive changes are noted. These changes are stable from the recent exam. No free fluid is seen. Musculoskeletal: Degenerative changes of lumbar spine are seen. No acute bony abnormality is noted. Review of the MIP images confirms the above findings. IMPRESSION: No evidence of aortic aneurysmal dilatation or dissection. No evidence of pulmonary emboli. Stable bilateral pulmonary nodules consistent with a benign etiology given their long-term stability. Diffuse diverticular change without diverticulitis. Right inguinal hernia containing loops of ileum without obstructive change. Multiple bilateral renal cysts. No acute abnormality is seen. Electronically Signed   By: Inez Catalina M.D.   On: 06/04/2018 20:00    Cardiac Studies   N/A  Assessment   Principal Problem:   Musculoskeletal chest pain Active Problems:   S/P TAVR (transcatheter aortic valve replacement)   Plan   1. Atypical chest pain, likely musculoskeletal. Has been doing some heavy lifting. Will provide flexeril. Labs unremarkable, trop negative, BNP normal, EKG normal. Feels better today. Ok to d/c. Recommend outpatient follow-up echo for moderate TAVR perivalvular leak per structural heart clinic and follow-up with Dr. Marlou Porch.  Time Spent Directly with Patient:  I have spent a total  of 25 minutes with the patient reviewing hospital notes, telemetry, EKGs, labs and examining the patient as well as establishing an assessment and plan that was discussed personally with the patient.  > 50% of time was spent in direct patient care.  Length of Stay:  LOS: 1 day   Pixie Casino, MD, [facility], [place] Director of the Advanced Lipid Disorders &  Cardiovascular Risk Reduction Clinic Diplomate of the American Board of Clinical Lipidology Attending Cardiologist  Direct Dial: [phone]  Fax: [phone]  Website:  [email or web address] 06/05/2018, 9:04 AM

## 2018-06-05 NOTE — Progress Notes (Signed)
Monitor alarmed HR 46-50. Pt was asymptomatically sleeping. BP stable. EKG 12 leads posted in Pt's chart. No distress, no chest pain. Will monitor.  Kennyth Lose, RN

## 2018-06-05 NOTE — Progress Notes (Signed)
Pt transfers from ED, arrives [facility] unit at 11:36 pm, alert and oriented x 4, chest pain 2-3/10 scale especially when he does deep breathing. He refuses to get morphine at this time. Vital signs stable, no distress at arrival. Sinus bradycardia on monitor, HR 55-59. Call CCMD with 2nd person verified, CHG bath given. Pt's daughter and grandson at bed side . Pt has limited English.His native language is [place] lanquage. Daughter, Ms. Claris Che and grandson are more fluent in [place] and support well with care plan. She lives 15 minutes from hospital and prefers to be the first call if MD needed. Will continue to monitor tonight.  Kennyth Lose, RN

## 2018-06-05 NOTE — Care Management CC44 (Signed)
Condition Code 44 Documentation Completed  Patient Details  Name: [name] MRN: [id number] Date of Birth: Nov 23, 1927   Condition Code 44 given:  Yes Patient signature on Condition Code 44 notice:  Yes Documentation of 2 MD's agreement:  Yes Code 44 added to claim:  Yes    Dawayne Patricia, RN 06/05/2018, 10:54 AM

## 2018-06-14 DIAGNOSIS — I10 Essential (primary) hypertension: Secondary | ICD-10-CM | POA: Diagnosis not present

## 2018-06-14 DIAGNOSIS — R0789 Other chest pain: Secondary | ICD-10-CM | POA: Diagnosis not present

## 2018-06-14 DIAGNOSIS — I351 Nonrheumatic aortic (valve) insufficiency: Secondary | ICD-10-CM | POA: Diagnosis not present

## 2018-07-11 ENCOUNTER — Telehealth: Payer: Self-pay | Admitting: *Deleted

## 2018-07-11 DIAGNOSIS — K4090 Unilateral inguinal hernia, without obstruction or gangrene, not specified as recurrent: Secondary | ICD-10-CM | POA: Diagnosis not present

## 2018-07-11 NOTE — Telephone Encounter (Signed)
   [facility] Pre-operative Risk Assessment    Request for surgical clearance:  1. What type of surgery is being performed? RIGHT INGUINAL HERNIA REPAIR   2. When is this surgery scheduled? TBD   3. What type of clearance is required (medical clearance vs. Pharmacy clearance to hold med vs. Both)? MEDICAL  4. Are there any medications that need to be held prior to surgery and how long?HOLD PLAVIX X 5 DAYS PRIOR, PT ALSO ON ASA   5. Practice name and name of physician performing surgery? [facility]; DR. Sherren Mocha GERKIN   6. What is your office phone number [phone]    7.   What is your office fax number [phone]  8.   Anesthesia type (None, local, MAC, general) ? GENERAL   Julaine Hua 07/11/2018, 4:23 PM  _________________________________________________________________   (provider comments below)

## 2018-07-18 NOTE — Telephone Encounter (Signed)
   Primary Cardiologist:Mark Marlou Porch, MD  Chart reviewed as part of pre-operative protocol coverage. Because of Adil Salay's past medical history and time since last visit, he/she will require a follow-up visit in order to better assess preoperative cardiovascular risk.  Pre-op covering staff: - Pleasecall patient to inform them to keep appt 07/24/18 at 1:20 P for clearance. . - Please contact requesting surgeon's office via preferred method (i.e, phone, fax) to inform them of need for appointment prior to surgery.  If applicable, this message will also be routed to pharmacy pool and/or primary cardiologist for input on holding anticoagulant/antiplatelet agent as requested below so that this information is available at time of patient's appointment.   Cecilie Kicks, NP  07/18/2018, 3:57 PM

## 2018-07-18 NOTE — Telephone Encounter (Signed)
Left a detailed message for the pt re: surgical clearance below and that it will be addressed at his upcoming appt, 07/24/2018, and if he had any questions, to call the office.  Routed to requesting surgeon's office to make them aware.

## 2018-07-24 ENCOUNTER — Encounter: Payer: Self-pay | Admitting: Cardiology

## 2018-07-24 ENCOUNTER — Telehealth: Payer: Self-pay | Admitting: *Deleted

## 2018-07-24 ENCOUNTER — Ambulatory Visit (INDEPENDENT_AMBULATORY_CARE_PROVIDER_SITE_OTHER): Payer: Medicare Other | Admitting: Cardiology

## 2018-07-24 VITALS — BP 120/60 | HR 55 | Ht 66.0 in | Wt 172.4 lb

## 2018-07-24 DIAGNOSIS — I10 Essential (primary) hypertension: Secondary | ICD-10-CM | POA: Diagnosis not present

## 2018-07-24 DIAGNOSIS — I350 Nonrheumatic aortic (valve) stenosis: Secondary | ICD-10-CM | POA: Diagnosis not present

## 2018-07-24 DIAGNOSIS — I5032 Chronic diastolic (congestive) heart failure: Secondary | ICD-10-CM

## 2018-07-24 DIAGNOSIS — M069 Rheumatoid arthritis, unspecified: Secondary | ICD-10-CM | POA: Diagnosis not present

## 2018-07-24 DIAGNOSIS — I739 Peripheral vascular disease, unspecified: Secondary | ICD-10-CM

## 2018-07-24 DIAGNOSIS — I779 Disorder of arteries and arterioles, unspecified: Secondary | ICD-10-CM | POA: Insufficient documentation

## 2018-07-24 DIAGNOSIS — Z952 Presence of prosthetic heart valve: Secondary | ICD-10-CM | POA: Diagnosis not present

## 2018-07-24 NOTE — Patient Instructions (Signed)
Medication Instructions:  The current medical regimen is effective;  continue present plan and medications.  You may hold your Plavix 7 days before your planned surgery.  You do not need to resume it after surgery but please continue your Aspirin.  If you need a refill on your cardiac medications before your next appointment, please call your pharmacy.   Follow-Up: At [facility], you and your health needs are our priority.  As part of our continuing mission to provide you with exceptional heart care, we have created designated Provider Care Teams.  These Care Teams include your primary Cardiologist (physician) and Advanced Practice Providers (APPs -  Physician Assistants and Nurse Practitioners) who all work together to provide you with the care you need, when you need it. You will need a follow up appointment in 6 months Truitt Merle, NP and 1 year with Dr Marlou Porch.  Please call our office 2 months in advance to schedule this appointment.  You may see Candee Furbish, MD or one of the following Advanced Practice Providers on your designated Care Team:   Truitt Merle, NP Cecilie Kicks, NP . Kathyrn Drown, NP  Thank you for choosing [facility]!!

## 2018-07-24 NOTE — Progress Notes (Signed)
Cardiology Office Note:    Date:  07/24/2018   ID:  [name], DOB 29-Aug-1927, MRN [id number]  PCP:  Lajean Manes, MD  Cardiologist:  Candee Furbish, MD  Electrophysiologist:  None   Referring MD: Lajean Manes, MD     History of Present Illness:    [name] is a 85 y.o. male here with follow-up for TAVR valve 23 mm placed on 03/27/2018 with nonobstructive CAD on catheterization 02/06/2018, rheumatoid arthritis on methotrexate here with presurgical evaluation prior to inguinal hernia repair.  Was in hospital on 06/05/2018 with atypical chest discomfort.  Noted on echocardiogram to have moderate TAVR perivalvular leak but was felt to have musculoskeletal discomfort.  Post TAVR via the transfemoral approach did demonstrate a large right inguinal hernia.  This was thought to be potentially hematoma or pseudoaneurysm at first but this was negative.  He has been having some discomfort with his hernia.  Plan was to get him through at least 3 months of uninterrupted dual antiplatelet therapy with aspirin and Plavix prior to surgery.  He has completed this.  He also has chronic diastolic heart failure which appears stable.  Low-dose Lasix.  Past Medical History:  Diagnosis Date  . BPH (benign prostatic hypertrophy)   . Depression   . Diverticulosis of colon   . GERD (gastroesophageal reflux disease)   . Gilbert's syndrome   . History of kidney stones   . HLD (hyperlipidemia)   . HOH (hard of hearing)    refuses to wear his Hearing Aid  . Hypertension   . Inguinal hernia    right  . Internal hemorrhoid   . Normal coronary arteries    a. Cath 07/2010: normal coronaries. Felt to have noncardiac CP/SOB at that time possibly r/t anxiety.  . RA (rheumatoid arthritis) ([place])    bilateral hands/ wrist--  seronegative  . S/P TAVR (transcatheter aortic valve replacement) 03/27/2018   23 mm Edwards Sapien 3 transcatheter heart valve placed via percutaneous right transfemoral approach   .  Severe aortic stenosis   . Thyroid nodule    noted 11-2009  . Wears dentures     Past Surgical History:  Procedure Laterality Date  . CARDIAC CATHETERIZATION  07-19-2010  dr Tressia Miners turner   normal coronary arteries,  ef 60%,  moderate aortic stenosis- gradient 56mmHg, normal right heart pressure  . CARDIOVASCULAR STRESS TEST  05/ 2011   dr [name]   normal low risk perfusion study  . CATARACT EXTRACTION W/ INTRAOCULAR LENS  IMPLANT, BILATERAL  2013  . CATARACT EXTRACTION, BILATERAL    . COLONOSCOPY  2010  approx  . INTRAOPERATIVE TRANSTHORACIC ECHOCARDIOGRAM N/A 03/27/2018   Procedure: INTRAOPERATIVE TRANSTHORACIC ECHOCARDIOGRAM;  Surgeon: Burnell Blanks, MD;  Location: [place];  Service: Open Heart Surgery;  Laterality: N/A;  . RIGHT/LEFT HEART CATH AND CORONARY ANGIOGRAPHY N/A 02/06/2018   Procedure: RIGHT/LEFT HEART CATH AND CORONARY ANGIOGRAPHY;  Surgeon: Burnell Blanks, MD;  Location: [place] [facility];  Service: Cardiovascular;  Laterality: N/A;  . THYROID LOBECTOMY Right 05/05/2015   Procedure: RIGHT THYROID LOBECTOMY;  Surgeon: Armandina Gemma, MD;  Location: [place];  Service: General;  Laterality: Right;  . TRANSANAL HEMORRHOIDAL DEARTERIALIZATION N/A 04/09/2014   Procedure: TRANSANAL HEMORRHOIDAL DEARTERIALIZATION OF INTERNAL HEMORROIDS;  Surgeon: Leighton Ruff, MD;  Location: [facility];  Service: General;  Laterality: N/A;  . TRANSCATHETER AORTIC VALVE REPLACEMENT, TRANSFEMORAL  03/27/2018  . TRANSCATHETER AORTIC VALVE REPLACEMENT, TRANSFEMORAL N/A 03/27/2018   Procedure: TRANSCATHETER AORTIC VALVE REPLACEMENT,  TRANSFEMORAL. Edwards SAPIEN 3 Transcatheter Heart Valve 68mm.;  Surgeon: Burnell Blanks, MD;  Location: [place];  Service: Open Heart Surgery;  Laterality: N/A;  . TRANSTHORACIC ECHOCARDIOGRAM  11-26-2013   moderate focal basal and mild LVH/  ef 75-17%/  grade I diastolic dysfunction/ mild LAE/  moderate calcification with stenosis AV  with mild regurg,  gradients 35 abd 58 mmHg /  mild TR    Current Medications: Current Meds  Medication Sig  . Ascorbic Acid (VITAMIN C) 1000 MG tablet Take 1,000 mg by mouth daily after lunch.  Marland Kitchen aspirin 81 MG tablet Take 1 tablet (81 mg total) by mouth daily.  . clopidogrel (PLAVIX) 75 MG tablet Take 1 tablet (75 mg total) by mouth daily with breakfast.  . dutasteride (AVODART) 0.5 MG capsule Take 0.5 mg by mouth daily.  Marland Kitchen escitalopram (LEXAPRO) 20 MG tablet Take 1 tablet (20 mg total) by mouth daily.  . folic acid (FOLVITE) 1 MG tablet Take 1 mg by mouth daily after lunch.   . ibuprofen (ADVIL,MOTRIN) 800 MG tablet Take 800 mg by mouth every morning.  Marland Kitchen losartan (COZAAR) 25 MG tablet Take 25 mg by mouth daily.   . Melatonin 10 MG TABS Take 10 mg by mouth at bedtime as needed (for sleep).   . methotrexate (RHEUMATREX) 2.5 MG tablet Take 22.5 mg by mouth every Sunday.   . Omega-3 Fatty Acids (FISH OIL) 1000 MG CAPS Take 1,000 mg by mouth daily.  . simvastatin (ZOCOR) 20 MG tablet TAKE 1 TABLET BY MOUTH EVERY NIGHT AT BEDTIME  . vitamin E 400 UNIT capsule Take 400 Units by mouth daily after lunch.     Allergies:   Ativan [lorazepam]; Phenergan [promethazine hcl]; and Eggs or egg-derived products   Social History   Socioeconomic History  . Marital status: Widowed    Spouse name: Not on file  . Number of children: Not on file  . Years of education: Not on file  . Highest education level: Not on file  Occupational History  . Not on file  Social Needs  . Financial resource strain: Not on file  . Food insecurity:    Worry: Not on file    Inability: Not on file  . Transportation needs:    Medical: Not on file    Non-medical: Not on file  Tobacco Use  . Smoking status: Former Smoker    Years: 40.00    Types: Cigarettes    Start date: 06/13/1973    Last attempt to quit: 04/03/1981    Years since quitting: 37.3  . Smokeless tobacco: Never Used  Substance and Sexual Activity  .  Alcohol use: Yes    Comment: occasional  . Drug use: No  . Sexual activity: Not on file  Lifestyle  . Physical activity:    Days per week: Not on file    Minutes per session: Not on file  . Stress: Not on file  Relationships  . Social connections:    Talks on phone: Not on file    Gets together: Not on file    Attends religious service: Not on file    Active member of club or organization: Not on file    Attends meetings of clubs or organizations: Not on file    Relationship status: Not on file  Other Topics Concern  . Not on file  Social History Narrative  . Not on file     Family History: The patient's family history includes Breast cancer  in his sister; CVA in his father; Diabetes in his brother and brother; Pulmonary embolism (age of onset: 38) in his mother.  ROS:   Please see the history of present illness.     All other systems reviewed and are negative.  EKGs/Labs/Other Studies Reviewed:    The following studies were reviewed today: Prior office notes echocardiogram lab work EKG  EKG:  EKG is  ordered today.  The ekg ordered today demonstrates sinus bradycardia 55 no other significant abnormalities.  Personally reviewed and interpreted.  Recent Labs: 02/04/2018: TSH 5.467 03/23/2018: ALT 20 03/28/2018: Magnesium 2.0 06/04/2018: B Natriuretic Peptide 45.6; BUN 19; Creatinine, Ser 1.11; Hemoglobin 13.5; Platelets 133; Potassium 4.1; Sodium 140  Recent Lipid Panel    Component Value Date/Time   CHOL 169 12/05/2013 0808   TRIG 136.0 12/05/2013 0808   HDL 51.80 12/05/2013 0808   CHOLHDL 3 12/05/2013 0808   VLDL 27.2 12/05/2013 0808   LDLCALC 90 12/05/2013 0808    Physical Exam:    VS:  BP 120/60   Pulse (!) 55   Ht 5\' 6"  (1.676 m)   Wt 172 lb 6.4 oz (78.2 kg)   SpO2 94%   BMI 27.83 kg/m     Wt Readings from Last 3 Encounters:  07/24/18 172 lb 6.4 oz (78.2 kg)  06/04/18 169 lb 5 oz (76.8 kg)  04/25/18 170 lb 1.9 oz (77.2 kg)     GEN: Well  nourished, well developed in no acute distress HEENT: Normal NECK: No JVD; No carotid bruits LYMPHATICS: No lymphadenopathy CARDIAC: RRR, no significant murmurs, rubs, gallops RESPIRATORY:  Clear to auscultation without rales, wheezing or rhonchi  ABDOMEN: Soft, non-tender, non-distended MUSCULOSKELETAL:  No edema; No deformity  SKIN: Warm and dry NEUROLOGIC:  Alert and oriented x 3 PSYCHIATRIC:  Normal affect   ASSESSMENT:    1. Chronic diastolic CHF (congestive heart failure) (HCC) Chronic  2. Essential hypertension   3. Severe aortic stenosis   4. S/P TAVR (transcatheter aortic valve replacement)   5. Rheumatoid arthritis, involving unspecified site, unspecified rheumatoid factor presence (HCC) Chronic  6. Left-sided carotid artery disease, unspecified type ([place])    PLAN:    In order of problems listed above:  Preoperative risk stratification, hernia repair - He has completed greater than 3 months of dual antiplatelet therapy.  We would like for him to continue his aspirin given his prosthetic aortic valve.  He may proceed with inguinal hernia repair with low to moderate overall risk since correction of his aortic stenosis.  TAVR valve.  Normal EF.  I spoke with Dr. Angelena Form, structural interventional cardiologist regarding our plan.  Right groin inguinal hernia - Dr. Harlow Asa.  Intermittent discomfort.  Chronic diastolic heart failure -Euvolemic.  Lasix 20 mg.  Rheumatoid arthritis -Continuing with methotrexate.  Severe aortic stenosis status post TAVR valve - Small perivalvular leak noted.  Left carotid artery plaque/disease -Mild 1 to 39%.  Continue with secondary prevention.  Medication Adjustments/Labs and Tests Ordered: Current medicines are reviewed at length with the patient today.  Concerns regarding medicines are outlined above.  Orders Placed This Encounter  Procedures  . EKG 12-Lead   No orders of the defined types were placed in this  encounter.   Patient Instructions  Medication Instructions:  The current medical regimen is effective;  continue present plan and medications.  You may hold your Plavix 7 days before your planned surgery.  You do not need to resume it after surgery but please continue your  Aspirin.  If you need a refill on your cardiac medications before your next appointment, please call your pharmacy.   Follow-Up: At [facility], you and your health needs are our priority.  As part of our continuing mission to provide you with exceptional heart care, we have created designated Provider Care Teams.  These Care Teams include your primary Cardiologist (physician) and Advanced Practice Providers (APPs -  Physician Assistants and Nurse Practitioners) who all work together to provide you with the care you need, when you need it. You will need a follow up appointment in 6 months Ryan Merle, [name] and 1 year with Dr [name].  Please call our office 2 months in advance to schedule this appointment.  You may see Candee Furbish, MD or one of the following Advanced Practice Providers on your designated Care Team:   Ryan Merle, [name] Cecilie Kicks, [name] . Kathyrn Drown, [name]  Thank you for choosing [facility]!!         Signed, Candee Furbish, MD  07/24/2018 3:39 PM    [place]

## 2018-07-24 NOTE — Telephone Encounter (Signed)
   [facility] Pre-operative Risk Assessment    Request for surgical clearance:  1. What type of surgery is being performed? RIGHT INGUINAL HERNIA REPAIR   2. When is this surgery scheduled? TBD   3. What type of clearance is required (medical clearance vs. Pharmacy clearance to hold med vs. Both)? MEDICAL  4. Are there any medications that need to be held prior to surgery and how long?HOLD PLAVIX X 5 DAYS PRIOR, PT ALSO ON ASA   5. Practice name and name of physician performing surgery? [facility]; DR. Sherren Mocha GERKIN   6. What is your office phone number [phone]    7.   What is your office fax number [phone]  8.   Anesthesia type (None, local, MAC, general) ? GENERAL  Per Dr Candee Furbish - pt may proceed with upcoming hernia surgery at a low to moderate cardiac risk secondary to previous transcatheter aortic valve replacement.  He may discontinue his Plavix 7 days before the surgery and does not need to resume it afterwards.  He will need to please continue his Aspirin without holding it for the surgery.  Thank you,  Kelli Churn, RN for Dr Candee Furbish

## 2018-07-25 DIAGNOSIS — M25512 Pain in left shoulder: Secondary | ICD-10-CM | POA: Diagnosis not present

## 2018-07-25 DIAGNOSIS — M8589 Other specified disorders of bone density and structure, multiple sites: Secondary | ICD-10-CM | POA: Diagnosis not present

## 2018-07-25 DIAGNOSIS — Z13820 Encounter for screening for osteoporosis: Secondary | ICD-10-CM | POA: Diagnosis not present

## 2018-07-25 DIAGNOSIS — M7582 Other shoulder lesions, left shoulder: Secondary | ICD-10-CM | POA: Diagnosis not present

## 2018-07-25 DIAGNOSIS — M0609 Rheumatoid arthritis without rheumatoid factor, multiple sites: Secondary | ICD-10-CM | POA: Diagnosis not present

## 2018-07-25 DIAGNOSIS — M1990 Unspecified osteoarthritis, unspecified site: Secondary | ICD-10-CM | POA: Diagnosis not present

## 2018-07-25 DIAGNOSIS — Z79899 Other long term (current) drug therapy: Secondary | ICD-10-CM | POA: Diagnosis not present

## 2018-07-26 ENCOUNTER — Ambulatory Visit: Payer: Self-pay | Admitting: Surgery

## 2018-08-16 NOTE — Progress Notes (Signed)
07-24-18 (Epic) Cardiac Clearance from Dr. Gillian Shields  07-24-18 (Epic) EKG  06-04-18 (Epic) CXR  04-25-18 (Epic) ECHO

## 2018-08-16 NOTE — Patient Instructions (Addendum)
[name]  08/16/2018   Your procedure is scheduled on: 08-23-18    Report to [facility] Main  Entrance    Report to Admitting at 9:00 AM    Call this number if you have problems the morning of surgery [phone]    Remember: Do not eat food or drink liquids :After Midnight.    BRUSH YOUR TEETH MORNING OF SURGERY AND RINSE YOUR MOUTH OUT, NO CHEWING GUM CANDY OR MINTS.     Take these medicines the morning of surgery with A SIP OF WATER: None                                You may not have any metal on your body including hair pins and              piercings  Do not wear jewelry, cologne, lotions, powders or perfumes, deodorant             Men may shave face and neck.   Do not bring valuables to the hospital. [place].  Contacts, dentures or bridgework may not be worn into surgery.     Patients discharged the day of surgery will not be allowed to drive home. IF YOU ARE HAVING SURGERY AND GOING HOME THE SAME DAY, YOU MUST HAVE AN ADULT TO DRIVE YOU HOME AND BE WITH YOU FOR 24 HOURS. YOU MAY GO HOME BY TAXI OR UBER OR ORTHERWISE, BUT AN ADULT MUST ACCOMPANY YOU HOME AND STAY WITH YOU FOR 24 HOURS.  Name and phone number of your driver: Jovann Luse [phone]   Special Instructions:               Please read over the following fact sheets you were given: _____________________________________________________________________             [facility] - Preparing for Surgery Before surgery, you can play an important role.  Because skin is not sterile, your skin needs to be as free of germs as possible.  You can reduce the number of germs on your skin by washing with CHG (chlorahexidine gluconate) soap before surgery.  CHG is an antiseptic cleaner which kills germs and bonds with the skin to continue killing germs even after washing. Please DO NOT use if you have an allergy to CHG or antibacterial soaps.   If your skin becomes reddened/irritated stop using the CHG and inform your nurse when you arrive at Short Stay. Do not shave (including legs and underarms) for at least 48 hours prior to the first CHG shower.  You may shave your face/neck. Please follow these instructions carefully:  1.  Shower with CHG Soap the night before surgery and the  morning of Surgery.  2.  If you choose to wash your hair, wash your hair first as usual with your  normal  shampoo.  3.  After you shampoo, rinse your hair and body thoroughly to remove the  shampoo.                           4.  Use CHG as you would any other liquid soap.  You can apply chg directly  to the skin and wash  Gently with a scrungie or clean washcloth.  5.  Apply the CHG Soap to your body ONLY FROM THE NECK DOWN.   Do not use on face/ open                           Wound or open sores. Avoid contact with eyes, ears mouth and genitals (private parts).                       Wash face,  Genitals (private parts) with your normal soap.             6.  Wash thoroughly, paying special attention to the area where your surgery  will be performed.  7.  Thoroughly rinse your body with warm water from the neck down.  8.  DO NOT shower/wash with your normal soap after using and rinsing off  the CHG Soap.                9.  Pat yourself dry with a clean towel.            10.  Wear clean pajamas.            11.  Place clean sheets on your bed the night of your first shower and do not  sleep with pets. Day of Surgery : Do not apply any lotions/deodorants the morning of surgery.  Please wear clean clothes to the hospital/surgery center.  FAILURE TO FOLLOW THESE INSTRUCTIONS MAY RESULT IN THE CANCELLATION OF YOUR SURGERY PATIENT SIGNATURE_________________________________  NURSE SIGNATURE__________________________________  ________________________________________________________________________

## 2018-08-17 ENCOUNTER — Encounter (HOSPITAL_COMMUNITY): Payer: Self-pay

## 2018-08-17 ENCOUNTER — Encounter (HOSPITAL_COMMUNITY)
Admission: RE | Admit: 2018-08-17 | Discharge: 2018-08-17 | Disposition: A | Discharge status: Home / Self Care | Payer: Medicare Other | Source: Ambulatory Visit | Attending: Surgery | Admitting: Surgery

## 2018-08-17 ENCOUNTER — Other Ambulatory Visit: Payer: Self-pay

## 2018-08-17 DIAGNOSIS — Z01812 Encounter for preprocedural laboratory examination: Secondary | ICD-10-CM | POA: Insufficient documentation

## 2018-08-17 LAB — CBC
HCT: 45.8 % (ref 39.0–52.0)
Hemoglobin: 14.2 g/dL (ref 13.0–17.0)
MCH: 29.6 pg (ref 26.0–34.0)
MCHC: 31 g/dL (ref 30.0–36.0)
MCV: 95.6 fL (ref 80.0–100.0)
Platelets: 130 10*3/uL — ABNORMAL LOW (ref 150–400)
RBC: 4.79 MIL/uL (ref 4.22–5.81)
RDW: 13.2 % (ref 11.5–15.5)
WBC: 5.6 10*3/uL (ref 4.0–10.5)
nRBC: 0 % (ref 0.0–0.2)

## 2018-08-17 LAB — BASIC METABOLIC PANEL
Anion gap: 7 (ref 5–15)
BUN: 20 mg/dL (ref 8–23)
CO2: 28 mmol/L (ref 22–32)
Calcium: 9.4 mg/dL (ref 8.9–10.3)
Chloride: 103 mmol/L (ref 98–111)
Creatinine, Ser: 1.23 mg/dL (ref 0.61–1.24)
GFR calc Af Amer: >60 mL/min (ref >60)
GFR calc non Af Amer: 52 mL/min — ABNORMAL LOW (ref >60)
Glucose, Bld: 104 mg/dL — ABNORMAL HIGH (ref 70–99)
Potassium: 4.1 mmol/L (ref 3.5–5.1)
SODIUM: 138 mmol/L (ref 135–145)

## 2018-08-20 NOTE — Anesthesia Preprocedure Evaluation (Addendum)
Anesthesia Evaluation  Patient identified by MRN, date of birth, ID band Patient awake    Reviewed: Allergy & Precautions, NPO status , Patient's Chart, lab work & pertinent test results  History of Anesthesia Complications Negative for: history of anesthetic complications  Airway Mallampati: II  TM Distance: >3 FB Neck ROM: Full    Dental  (+) Partial Upper, Dental Advisory Given,    Pulmonary former smoker,    breath sounds clear to auscultation       Cardiovascular hypertension, Pt. on medications +CHF   Rhythm:Regular     Neuro/Psych PSYCHIATRIC DISORDERS Depression    GI/Hepatic Neg liver ROS, GERD  Medicated and Controlled,  Endo/Other  negative endocrine ROS  Renal/GU negative Renal ROS     Musculoskeletal  (+) Arthritis ,   Abdominal   Peds  Hematology negative hematology ROS (+)   Anesthesia Other Findings   Reproductive/Obstetrics                            Anesthesia Physical Anesthesia Plan  ASA: II  Anesthesia Plan: General and Regional   Post-op Pain Management:  Regional for Post-op pain   Induction: Intravenous  PONV Risk Score and Plan: 2 and Ondansetron and Dexamethasone  Airway Management Planned: LMA  Additional Equipment: None  Intra-op Plan:   Post-operative Plan: Extubation in OR  Informed Consent: I have reviewed the patients History and Physical, chart, labs and discussed the procedure including the risks, benefits and alternatives for the proposed anesthesia with the patient or authorized representative who has indicated his/her understanding and acceptance.     Dental advisory given  Plan Discussed with: CRNA and Surgeon  Anesthesia Plan Comments: (See PAT note 08/17/18, Konrad Felix, PA-C)       Anesthesia Quick Evaluation

## 2018-08-20 NOTE — Progress Notes (Signed)
Anesthesia Chart Review   Case:  [id number] Date/Time:  08/23/18 1045   Procedure:  OPEN RIGHT INGUINAL HERNIA REPAIR WITH MESH (Right )   Anesthesia type:  General   Pre-op diagnosis:  right inguinal hernia, reducible   Location:  [facility] ROOM 05 / [facility]   Surgeon:  Armandina Gemma, MD      DISCUSSION: 85 yo former smoker (quit 04/03/81) with h/o Gilbert's syndrome, HTN, BPH, AS (s/p TAVR 03/2018), depression, RA, GERD, HLD, right inguinal hernia scheduled for above procedure 08/23/18 with Dr. Armandina Gemma.   Pt last seen by cardiologist, Dr. Candee Furbish, 07/24/18.  Per his note, "He has completed greater than 3 months of dual antiplatelet therapy.  We would like for him to continue his aspirin given his prosthetic aortic valve.  He may proceed with inguinal hernia repair with low to moderate overall risk since correction of his aortic stenosis.  TAVR valve.  Normal EF.  I spoke with Dr. Angelena Form, structural interventional cardiologist regarding our plan."  Pt can proceed with planned procedure barring acute status change.  VS: BP 133/66 (BP Location: Right Arm)   Pulse 61   Temp 36.8 C (Oral)   Resp 18   Ht 5\' 6"  (1.676 m)   Wt 77.3 kg   SpO2 98%   BMI 27.50 kg/m   PROVIDERS: Lajean Manes, MD is PCP   Candee Furbish, MD is Cardiologist  LABS: Labs reviewed: Acceptable for surgery. (all labs ordered are listed, but only abnormal results are displayed)  Labs Reviewed  CBC - Abnormal; Notable for the following components:      Result Value   Platelets 130 (*)    All other components within normal limits  BASIC METABOLIC PANEL - Abnormal; Notable for the following components:   Glucose, Bld 104 (*)    GFR calc non Af Amer 52 (*)    All other components within normal limits     IMAGES: CT Angio Chest/Abd/Pelvis 06/04/18  IMPRESSION: No evidence of aortic aneurysmal dilatation or dissection.  No evidence of pulmonary emboli.  Stable bilateral pulmonary nodules consistent with  a benign etiology given their long-term stability.  Diffuse diverticular change without diverticulitis.  Right inguinal hernia containing loops of ileum without obstructive change.  Multiple bilateral renal cysts.  No acute abnormality is seen.  Carotid Doppler 02/05/18 Final Interpretation: Right Carotid: There is no evidence of stenosis in the right ICA.  Left Carotid: Velocities in the left ICA are consistent with a 1-39% stenosis.  Vertebrals: Bilateral vertebral arteries demonstrate antegrade flow.  EKG: 07/24/18 Rate 55 bpm Sinus bradycardia   CV: Echo 04/25/18 Study Conclusions  - Left ventricle: The cavity size was normal. There was mild   concentric hypertrophy. Systolic function was normal. The   estimated ejection fraction was in the range of 60% to 65%. Wall   motion was normal; there were no regional wall motion   abnormalities. Doppler parameters are consistent with abnormal   left ventricular relaxation (grade 1 diastolic dysfunction). - Aortic valve: S/P TAVR. A prosthesis was present and functioning   normally. The prosthesis had a normal range of motion. The sewing   ring appeared normal, had no rocking motion, and showed no   evidence of dehiscence. There was moderate perivalvular   regurgitation. Mean gradient (S): 14 mm Hg. Regurgitation   pressure half-time: 386 ms. - Left atrium: The atrium was mildly dilated. - Atrial septum: No defect or patent foramen ovale was identified. - Pulmonary arteries:  PA peak pressure: 32 mm Hg (S).  Impressions:  - S/P TAVR. On this study, there is moderate aortic regurgitation   that appears most likel perivalvular in nature. It was not seen   on prior study. Mean gradient is similar to prior.  Cardiac Cath 02/06/18 (subsequent TAVR)  Prox Cx lesion is 20% stenosed.  Ost 1st Mrg lesion is 20% stenosed.  Prox LAD lesion is 20% stenosed.   1. Mild non-obstructive CAD 2. Severe aortic stenosis  (mean gradient 54.9 mmHg, peak to peak gradient 53 mmHg, AVA 0.60 cm2) 3. Mean wedge pressure 12 mmHg  Recommendation: Will continue workup for TAVR. We will try to get his scans in the hospital but he can be discharged home after his testing and come back for TAVR as an outpatient.  Past Medical History:  Diagnosis Date  . BPH (benign prostatic hypertrophy)   . Depression   . Diverticulosis of colon   . GERD (gastroesophageal reflux disease)   . Gilbert's syndrome   . History of kidney stones   . HLD (hyperlipidemia)   . HOH (hard of hearing)    refuses to wear his Hearing Aid  . Hypertension   . Inguinal hernia    right  . Internal hemorrhoid   . Normal coronary arteries    a. Cath 07/2010: normal coronaries. Felt to have noncardiac CP/SOB at that time possibly r/t anxiety.  . RA (rheumatoid arthritis) ([place])    bilateral hands/ wrist--  seronegative  . S/P TAVR (transcatheter aortic valve replacement) 03/27/2018   23 mm Edwards Sapien 3 transcatheter heart valve placed via percutaneous right transfemoral approach   . Severe aortic stenosis   . Thyroid nodule    noted 11-2009  . Wears dentures     Past Surgical History:  Procedure Laterality Date  . CARDIAC CATHETERIZATION  07-19-2010  dr Tressia Miners turner   normal coronary arteries,  ef 60%,  moderate aortic stenosis- gradient 66mmHg, normal right heart pressure  . CARDIOVASCULAR STRESS TEST  05/ 2011   dr Marlou Porch   normal low risk perfusion study  . CATARACT EXTRACTION W/ INTRAOCULAR LENS  IMPLANT, BILATERAL  2013  . CATARACT EXTRACTION, BILATERAL    . COLONOSCOPY  2010  approx  . INTRAOPERATIVE TRANSTHORACIC ECHOCARDIOGRAM N/A 03/27/2018   Procedure: INTRAOPERATIVE TRANSTHORACIC ECHOCARDIOGRAM;  Surgeon: Burnell Blanks, MD;  Location: [place];  Service: Open Heart Surgery;  Laterality: N/A;  . RIGHT/LEFT HEART CATH AND CORONARY ANGIOGRAPHY N/A 02/06/2018   Procedure: RIGHT/LEFT HEART CATH AND CORONARY ANGIOGRAPHY;   Surgeon: Burnell Blanks, MD;  Location: [place] [facility];  Service: Cardiovascular;  Laterality: N/A;  . THYROID LOBECTOMY Right 05/05/2015   Procedure: RIGHT THYROID LOBECTOMY;  Surgeon: Armandina Gemma, MD;  Location: [place];  Service: General;  Laterality: Right;  . TRANSANAL HEMORRHOIDAL DEARTERIALIZATION N/A 04/09/2014   Procedure: TRANSANAL HEMORRHOIDAL DEARTERIALIZATION OF INTERNAL HEMORROIDS;  Surgeon: Leighton Ruff, MD;  Location: [facility];  Service: General;  Laterality: N/A;  . TRANSCATHETER AORTIC VALVE REPLACEMENT, TRANSFEMORAL  03/27/2018  . TRANSCATHETER AORTIC VALVE REPLACEMENT, TRANSFEMORAL N/A 03/27/2018   Procedure: TRANSCATHETER AORTIC VALVE REPLACEMENT, TRANSFEMORAL. Edwards SAPIEN 3 Transcatheter Heart Valve 54mm.;  Surgeon: Burnell Blanks, MD;  Location: [place];  Service: Open Heart Surgery;  Laterality: N/A;  . TRANSTHORACIC ECHOCARDIOGRAM  11-26-2013   moderate focal basal and mild LVH/  ef 40-98%/  grade I diastolic dysfunction/ mild LAE/  moderate calcification with stenosis AV with mild regurg,  gradients 35  abd 58 mmHg /  mild TR    MEDICATIONS: . Ascorbic Acid (VITAMIN C) 1000 MG tablet  . aspirin 81 MG tablet  . cholecalciferol (VITAMIN D3) 25 MCG (1000 UT) tablet  . clopidogrel (PLAVIX) 75 MG tablet  . dutasteride (AVODART) 0.5 MG capsule  . escitalopram (LEXAPRO) 20 MG tablet  . folic acid (FOLVITE) 1 MG tablet  . furosemide (LASIX) 20 MG tablet  . ibuprofen (ADVIL,MOTRIN) 800 MG tablet  . losartan (COZAAR) 25 MG tablet  . Melatonin 5 MG TABS  . methotrexate (RHEUMATREX) 2.5 MG tablet  . Omega-3 Fatty Acids (FISH OIL) 1000 MG CAPS  . simvastatin (ZOCOR) 20 MG tablet  . vitamin E 400 UNIT capsule   No current facility-administered medications for this encounter.     Maia Plan [facility] Pre-Surgical Testing [phone] 08/20/18 4:50 PM

## 2018-08-22 ENCOUNTER — Encounter (HOSPITAL_COMMUNITY): Payer: Self-pay | Admitting: Surgery

## 2018-08-22 DIAGNOSIS — K4090 Unilateral inguinal hernia, without obstruction or gangrene, not specified as recurrent: Secondary | ICD-10-CM

## 2018-08-22 NOTE — H&P (Signed)
General Surgery [facility]  [name] DOB: 05/30/1928 Widowed / Language: [place] / Race: White Male   History of Present Illness   The patient is a 85 year old male who presents with an inguinal hernia.  CHIEF COMPLAINT: right inguinal hernia  Patient returns for follow-up of symptomatic right inguinal hernia. He is accompanied by his daughter. She acts as Optometrist. Patient continues to take Plavix and aspirin after undergoing aortic valve repair. He is scheduled to see his cardiologist, Dr. Mel Almond, in the near future. Patient continues to note the right inguinal hernia. It does cause him intermittent discomfort. He denies having any problems with his bowel movements and is not currently taking a laxative. His daughter is interested in proceeding with hernia surgery in March of 2020 if possible.   Problem List/Past Medical  POST-OPERATIVE STATE [phone])  3 weeks status post T. H. D. Done for rectal bleeding NEOPLASM OF UNCERTAIN BEHAVIOR OF THYROID GLAND (D44.0)  INGUINAL HERNIA OF RIGHT SIDE WITHOUT OBSTRUCTION OR GANGRENE (K40.90)   Past Surgical History Hemorrhoidectomy   Diagnostic Studies History Colonoscopy  >10 years ago  Allergies  Eggs  CITRUS FRUIT  Allergies Reconciled   Medication History Ibuprofen (800MG  Tablet, Oral) Active. Simvastatin (20MG  Tablet, Oral) Active. Omeprazole (20MG  Capsule DR, Oral) Active. Methotrexate (2.5MG  Tablet, Oral) Active. Hydrochlorothiazide (12.5MG  Capsule, Oral) Active. Escitalopram Oxalate (20MG  Tablet, Oral) Active. Diovan (80MG  Tablet, Oral) Active. Metamucil (58.6% Packet, Oral) Active. Medications Reconciled  Social History  Alcohol use  Occasional alcohol use. Caffeine use  Coffee, Tea. No drug use  Tobacco use  Former smoker.  Family History First Degree Relatives  No pertinent family history   Other Problems Arthritis  Bladder Problems   Hemorrhoids  Hypercholesterolemia  Kidney Stone  Migraine Headache   Vitals  Weight: 173.8 lb Height: 66in Body Surface Area: 1.88 m Body Mass Index: 28.05 kg/m  Temp.: 98.66F  BP: 128/84 (Sitting, Left Arm, Standard)  Physical Exam  See vital signs recorded above  GENERAL APPEARANCE Development: normal Nutritional status: normal Gross deformities: none  SKIN Rash, lesions, ulcers: none Induration, erythema: none Nodules: none palpable  EYES Conjunctiva and lids: normal Pupils: equal and reactive Iris: normal bilaterally  EARS, NOSE, MOUTH, THROAT External ears: no lesion or deformity External nose: no lesion or deformity Hearing: grossly normal Lips: no lesion or deformity Dentition: normal for age Oral mucosa: moist  NECK Symmetric: yes Trachea: midline Thyroid: no palpable nodules in the thyroid bed  CHEST Respiratory effort: normal Retraction or accessory muscle use: no Breath sounds: normal bilaterally Rales, rhonchi, wheeze: none  CARDIOVASCULAR Auscultation: regular rhythm, normal rate Murmurs: Grade 3 systolic murmur left upper sternal border Pulses: carotid and radial pulse 2+ palpable Lower extremity edema: none Lower extremity varicosities: none  ABDOMEN Distension: none Masses: none palpable Tenderness: none Hepatosplenomegaly: not present Hernia: Small umbilical hernia, reducible, nontender, asymptomatic  GENITOURINARY Penis: no lesions Scrotum: no masses Palpation in the left inguinal canal with cough and Valsalva shows no sign of hernia. There is an obvious bulge in the right groin. On palpation there is a moderately large hernia which augments with coughing and Valsalva. It is mildly tender. It is reducible with some manipulation.  MUSCULOSKELETAL Station and gait: normal Digits and nails: no clubbing or cyanosis Muscle strength: grossly normal all extremities Range of motion: grossly normal all  extremities Deformity: none  LYMPHATIC Cervical: none palpable Supraclavicular: none palpable  PSYCHIATRIC Oriented to person, place, and time: yes Mood  and affect: normal for situation Judgment and insight: appropriate for situation    Assessment & Plan  INGUINAL HERNIA OF RIGHT SIDE WITHOUT OBSTRUCTION OR GANGRENE (K40.90)  Follow Up - Call [facility] office after tests / studies doneto discuss further plans  Patient returns to the office accompanied by his daughter to again discuss right inguinal hernia repair. While the patient is having less symptoms and he has had in the past, he is still having intermittent discomfort. On physical examination the hernia is moderately large and likely does contain bowel. Patient is at risk for incarceration or obstruction.  I would like to proceed with hernia repair on a semi-electively based as later this spring. Patient will require cardiac evaluation and cardiac clearance prior to any surgery. He is currently taking Plavix and I would like to have him off of the Plavix for his surgical procedure, even if it is just for 5 days prior to the procedure and then resume the Plavix the day following his procedure. We will ask for guidance from his cardiologist. Patient does have a significant murmur on examination. I expect that he will need to have a cardiac echo examination prior to any operative intervention.  We discussed inguinal hernia repair. We discussed the use of prosthetic mesh. We discussed the potential overnight hospital stay following the procedure. We discussed his recovery at home and his gradual return to normal activities. The patient and his daughter understand and wish to proceed as outlined above.   Armandina Gemma, [place] [facility] Office: [phone]

## 2018-08-23 ENCOUNTER — Ambulatory Visit (HOSPITAL_COMMUNITY)
Admission: RE | Admit: 2018-08-23 | Discharge: 2018-08-23 | Disposition: A | Discharge status: Home / Self Care | Payer: Medicare Other | Attending: Surgery | Admitting: Surgery

## 2018-08-23 ENCOUNTER — Ambulatory Visit (HOSPITAL_COMMUNITY): Payer: Medicare Other | Admitting: Anesthesiology

## 2018-08-23 ENCOUNTER — Ambulatory Visit (HOSPITAL_COMMUNITY): Payer: Medicare Other | Admitting: Physician Assistant

## 2018-08-23 ENCOUNTER — Encounter (HOSPITAL_COMMUNITY)
Admission: RE | Disposition: A | Discharge status: Home / Self Care | Payer: Self-pay | Source: Home / Self Care | Attending: Surgery

## 2018-08-23 ENCOUNTER — Other Ambulatory Visit: Payer: Self-pay

## 2018-08-23 ENCOUNTER — Encounter (HOSPITAL_COMMUNITY): Payer: Self-pay | Admitting: Emergency Medicine

## 2018-08-23 DIAGNOSIS — K4090 Unilateral inguinal hernia, without obstruction or gangrene, not specified as recurrent: Secondary | ICD-10-CM | POA: Diagnosis not present

## 2018-08-23 DIAGNOSIS — Z9889 Other specified postprocedural states: Secondary | ICD-10-CM | POA: Insufficient documentation

## 2018-08-23 DIAGNOSIS — Z7902 Long term (current) use of antithrombotics/antiplatelets: Secondary | ICD-10-CM | POA: Insufficient documentation

## 2018-08-23 DIAGNOSIS — E7800 Pure hypercholesterolemia, unspecified: Secondary | ICD-10-CM | POA: Diagnosis not present

## 2018-08-23 DIAGNOSIS — Z791 Long term (current) use of non-steroidal anti-inflammatories (NSAID): Secondary | ICD-10-CM | POA: Insufficient documentation

## 2018-08-23 DIAGNOSIS — Z79899 Other long term (current) drug therapy: Secondary | ICD-10-CM | POA: Insufficient documentation

## 2018-08-23 DIAGNOSIS — G8918 Other acute postprocedural pain: Secondary | ICD-10-CM | POA: Diagnosis not present

## 2018-08-23 DIAGNOSIS — K219 Gastro-esophageal reflux disease without esophagitis: Secondary | ICD-10-CM | POA: Diagnosis not present

## 2018-08-23 DIAGNOSIS — I110 Hypertensive heart disease with heart failure: Secondary | ICD-10-CM | POA: Diagnosis not present

## 2018-08-23 DIAGNOSIS — Z7982 Long term (current) use of aspirin: Secondary | ICD-10-CM | POA: Diagnosis not present

## 2018-08-23 DIAGNOSIS — F329 Major depressive disorder, single episode, unspecified: Secondary | ICD-10-CM | POA: Insufficient documentation

## 2018-08-23 DIAGNOSIS — Z87891 Personal history of nicotine dependence: Secondary | ICD-10-CM | POA: Diagnosis not present

## 2018-08-23 DIAGNOSIS — M1990 Unspecified osteoarthritis, unspecified site: Secondary | ICD-10-CM | POA: Insufficient documentation

## 2018-08-23 DIAGNOSIS — I509 Heart failure, unspecified: Secondary | ICD-10-CM | POA: Insufficient documentation

## 2018-08-23 HISTORY — PX: INGUINAL HERNIA REPAIR: SHX194

## 2018-08-23 SURGERY — REPAIR, HERNIA, INGUINAL, ADULT
Anesthesia: Regional | Site: Inguinal | Laterality: Right

## 2018-08-23 MED ORDER — CEFAZOLIN SODIUM-DEXTROSE 2-4 GM/100ML-% IV SOLN
2.0000 g | INTRAVENOUS | Status: AC
  Administered 2018-08-23: 2 g via INTRAVENOUS
  Filled 2018-08-23: qty 100

## 2018-08-23 MED ORDER — OXYCODONE HCL 5 MG/5ML PO SOLN: 5.0000 mg | Freq: Once | ORAL | Status: DC | PRN

## 2018-08-23 MED ORDER — ONDANSETRON HCL 4 MG/2ML IJ SOLN
INTRAMUSCULAR | Status: DC | PRN
  Administered 2018-08-23: 4 mg via INTRAVENOUS

## 2018-08-23 MED ORDER — ONDANSETRON HCL 4 MG/2ML IJ SOLN
INTRAMUSCULAR | Status: AC
  Filled 2018-08-23: qty 2

## 2018-08-23 MED ORDER — PROPOFOL 10 MG/ML IV BOLUS
INTRAVENOUS | Status: DC | PRN
  Administered 2018-08-23: 160 mg via INTRAVENOUS

## 2018-08-23 MED ORDER — BUPIVACAINE HCL (PF) 0.5 % IJ SOLN
INTRAMUSCULAR | Status: DC | PRN
  Administered 2018-08-23: 20 mL

## 2018-08-23 MED ORDER — DEXAMETHASONE SODIUM PHOSPHATE 10 MG/ML IJ SOLN
INTRAMUSCULAR | Status: DC | PRN
  Administered 2018-08-23: 8 mg via INTRAVENOUS

## 2018-08-23 MED ORDER — ROCURONIUM BROMIDE 100 MG/10ML IV SOLN
INTRAVENOUS | Status: DC | PRN
  Administered 2018-08-23: 5 mg via INTRAVENOUS
  Administered 2018-08-23: 55 mg via INTRAVENOUS
  Administered 2018-08-23: 10 mg via INTRAVENOUS

## 2018-08-23 MED ORDER — FENTANYL CITRATE (PF) 100 MCG/2ML IJ SOLN
INTRAMUSCULAR | Status: DC | PRN
  Administered 2018-08-23 (×2): 50 ug via INTRAVENOUS

## 2018-08-23 MED ORDER — FENTANYL CITRATE (PF) 100 MCG/2ML IJ SOLN: 25.0000 ug | INTRAMUSCULAR | Status: DC | PRN

## 2018-08-23 MED ORDER — CHLORHEXIDINE GLUCONATE CLOTH 2 % EX PADS: 6.0000 | MEDICATED_PAD | Freq: Once | CUTANEOUS | Status: DC

## 2018-08-23 MED ORDER — TRAMADOL HCL 50 MG PO TABS: 50.0000 mg | ORAL_TABLET | Freq: Four times a day (QID) | ORAL | 0 refills | Status: DC | PRN

## 2018-08-23 MED ORDER — FENTANYL CITRATE (PF) 100 MCG/2ML IJ SOLN
50.0000 ug | INTRAMUSCULAR | Status: DC
  Filled 2018-08-23: qty 2

## 2018-08-23 MED ORDER — OXYCODONE HCL 5 MG PO TABS: 5.0000 mg | ORAL_TABLET | Freq: Once | ORAL | Status: DC | PRN

## 2018-08-23 MED ORDER — BUPIVACAINE HCL (PF) 0.5 % IJ SOLN
INTRAMUSCULAR | Status: AC
  Filled 2018-08-23: qty 30

## 2018-08-23 MED ORDER — 0.9 % SODIUM CHLORIDE (POUR BTL) OPTIME
TOPICAL | Status: DC | PRN
  Administered 2018-08-23: 1000 mL

## 2018-08-23 MED ORDER — LIDOCAINE 2% (20 MG/ML) 5 ML SYRINGE
INTRAMUSCULAR | Status: AC
  Filled 2018-08-23: qty 5

## 2018-08-23 MED ORDER — MIDAZOLAM HCL 2 MG/2ML IJ SOLN
1.0000 mg | INTRAMUSCULAR | Status: DC
  Filled 2018-08-23: qty 2

## 2018-08-23 MED ORDER — LACTATED RINGERS IV SOLN
INTRAVENOUS | Status: DC
  Administered 2018-08-23: 11:00:00 via INTRAVENOUS

## 2018-08-23 MED ORDER — ROCURONIUM BROMIDE 10 MG/ML (PF) SYRINGE
PREFILLED_SYRINGE | INTRAVENOUS | Status: AC
  Filled 2018-08-23: qty 10

## 2018-08-23 MED ORDER — PROPOFOL 10 MG/ML IV BOLUS
INTRAVENOUS | Status: AC
  Filled 2018-08-23: qty 20

## 2018-08-23 MED ORDER — ACETAMINOPHEN 500 MG PO TABS: 1000.0000 mg | ORAL_TABLET | Freq: Once | ORAL | Status: DC | PRN

## 2018-08-23 MED ORDER — SUGAMMADEX SODIUM 200 MG/2ML IV SOLN
INTRAVENOUS | Status: DC | PRN
  Administered 2018-08-23: 200 mg via INTRAVENOUS

## 2018-08-23 MED ORDER — ACETAMINOPHEN 10 MG/ML IV SOLN: 1000.0000 mg | Freq: Once | INTRAVENOUS | Status: DC | PRN

## 2018-08-23 MED ORDER — FENTANYL CITRATE (PF) 250 MCG/5ML IJ SOLN
INTRAMUSCULAR | Status: AC
  Filled 2018-08-23: qty 5

## 2018-08-23 MED ORDER — SUGAMMADEX SODIUM 200 MG/2ML IV SOLN
INTRAVENOUS | Status: AC
  Filled 2018-08-23: qty 2

## 2018-08-23 MED ORDER — ACETAMINOPHEN 160 MG/5ML PO SOLN: 1000.0000 mg | Freq: Once | ORAL | Status: DC | PRN

## 2018-08-23 MED ORDER — BUPIVACAINE-EPINEPHRINE (PF) 0.5% -1:200000 IJ SOLN
INTRAMUSCULAR | Status: DC | PRN
  Administered 2018-08-23: 30 mL via PERINEURAL

## 2018-08-23 MED ORDER — PHENYLEPHRINE HCL 10 MG/ML IJ SOLN
INTRAMUSCULAR | Status: DC | PRN
  Administered 2018-08-23: 160 ug via INTRAVENOUS
  Administered 2018-08-23: 80 ug via INTRAVENOUS

## 2018-08-23 MED ORDER — EPHEDRINE SULFATE 50 MG/ML IJ SOLN
INTRAMUSCULAR | Status: DC | PRN
  Administered 2018-08-23: 5 mg via INTRAVENOUS
  Administered 2018-08-23: 10 mg via INTRAVENOUS

## 2018-08-23 MED ORDER — PHENYLEPHRINE 40 MCG/ML (10ML) SYRINGE FOR IV PUSH (FOR BLOOD PRESSURE SUPPORT)
PREFILLED_SYRINGE | INTRAVENOUS | Status: AC
  Filled 2018-08-23: qty 10

## 2018-08-23 SURGICAL SUPPLY — 34 items
BLADE SURG 15 STRL LF DISP TIS (BLADE) ×1 IMPLANT
BLADE SURG 15 STRL SS (BLADE) ×1
CHLORAPREP W/TINT 26ML (MISCELLANEOUS) ×2 IMPLANT
COVER SURGICAL LIGHT HANDLE (MISCELLANEOUS) ×2 IMPLANT
COVER WAND RF STERILE (DRAPES) IMPLANT
DECANTER SPIKE VIAL GLASS SM (MISCELLANEOUS) ×2 IMPLANT
DERMABOND ADVANCED (GAUZE/BANDAGES/DRESSINGS) ×1
DERMABOND ADVANCED .7 DNX12 (GAUZE/BANDAGES/DRESSINGS) ×1 IMPLANT
DRAIN PENROSE 18X1/2 LTX STRL (DRAIN) ×2 IMPLANT
DRAPE LAPAROTOMY TRNSV 102X78 (DRAPE) ×2 IMPLANT
ELECT PENCIL ROCKER SW 15FT (MISCELLANEOUS) ×2 IMPLANT
ELECT REM PT RETURN 15FT ADLT (MISCELLANEOUS) ×2 IMPLANT
GLOVE BIOGEL PI IND STRL 7.0 (GLOVE) ×1 IMPLANT
GLOVE BIOGEL PI INDICATOR 7.0 (GLOVE) ×1
GLOVE SURG ORTHO 8.0 STRL STRW (GLOVE) ×2 IMPLANT
GOWN STRL REUS W/TWL XL LVL3 (GOWN DISPOSABLE) ×4 IMPLANT
KIT BASIN OR (CUSTOM PROCEDURE TRAY) ×2 IMPLANT
KIT TURNOVER KIT A (KITS) IMPLANT
MESH ULTRAPRO 3X6 7.6X15CM (Mesh General) ×2 IMPLANT
NEEDLE HYPO 25X1 1.5 SAFETY (NEEDLE) ×2 IMPLANT
NS IRRIG 1000ML POUR BTL (IV SOLUTION) ×2 IMPLANT
PACK BASIC VI WITH GOWN DISP (CUSTOM PROCEDURE TRAY) ×2 IMPLANT
SPONGE LAP 4X18 RFD (DISPOSABLE) ×6 IMPLANT
STRIP CLOSURE SKIN 1/2X4 (GAUZE/BANDAGES/DRESSINGS) ×2 IMPLANT
SUT MNCRL AB 4-0 PS2 18 (SUTURE) ×2 IMPLANT
SUT NOVA NAB DX-16 0-1 5-0 T12 (SUTURE) ×2 IMPLANT
SUT NOVA NAB GS-21 0 18 T12 DT (SUTURE) IMPLANT
SUT NOVA NAB GS-22 2 0 T19 (SUTURE) ×4 IMPLANT
SUT SILK 2 0 SH (SUTURE) ×4 IMPLANT
SUT VIC AB 3-0 SH 18 (SUTURE) ×2 IMPLANT
SYR BULB IRRIGATION 50ML (SYRINGE) ×2 IMPLANT
SYR CONTROL 10ML LL (SYRINGE) ×2 IMPLANT
TOWEL OR 17X26 10 PK STRL BLUE (TOWEL DISPOSABLE) ×2 IMPLANT
YANKAUER SUCT BULB TIP 10FT TU (MISCELLANEOUS) ×2 IMPLANT

## 2018-08-23 NOTE — Transfer of Care (Signed)
Immediate Anesthesia Transfer of Care Note  Patient: [name]  Procedure(s) Performed: OPEN RIGHT INGUINAL HERNIA REPAIR WITH MESH (Right Inguinal)  Patient Location: PACU  Anesthesia Type:General  Level of Consciousness: drowsy, patient cooperative and responds to stimulation  Airway & Oxygen Therapy: Patient Spontanous Breathing and Patient connected to face mask oxygen  Post-op Assessment: Report given to RN and Post -op Vital signs reviewed and stable  Post vital signs: Reviewed and stable  Last Vitals:  Vitals Value Taken Time  BP 147/54 08/23/2018  1:15 PM  Temp    Pulse 72 08/23/2018  1:16 PM  Resp 19 08/23/2018  1:16 PM  SpO2 100 % 08/23/2018  1:16 PM  Vitals shown include unvalidated device data.  Last Pain:  Vitals:   08/23/18 1037  TempSrc:   PainSc: 0-No pain      Patients Stated Pain Goal: 4 (03/55/97 4163)  Complications: No apparent anesthesia complications

## 2018-08-23 NOTE — Anesthesia Procedure Notes (Signed)
Anesthesia Regional Block: TAP block   Pre-Anesthetic Checklist: ,, timeout performed, Correct Patient, Correct Site, Correct Laterality, Correct Procedure, Correct Position, site marked, Risks and benefits discussed,  Surgical consent,  Pre-op evaluation,  At surgeon's request and post-op pain management  Laterality: Right  Prep: chloraprep       Needles:  Injection technique: Single-shot     Needle Length: 9cm  Needle Gauge: 22     Additional Needles: Arrow StimuQuik ECHO Echogenic Stimulating PNB Needle  Procedures:,,,, ultrasound used (permanent image in chart),,,,  Narrative:  Start time: 08/23/2018 12:58 PM End time: 08/23/2018 1:01 PM Injection made incrementally with aspirations every 5 mL.  Performed by: Personally  Anesthesiologist: Oleta Mouse, MD

## 2018-08-23 NOTE — Anesthesia Postprocedure Evaluation (Signed)
Anesthesia Post Note  Patient: [name]  Procedure(s) Performed: OPEN RIGHT INGUINAL HERNIA REPAIR WITH MESH (Right Inguinal)     Patient location during evaluation: PACU Anesthesia Type: Regional and General Level of consciousness: awake and alert Pain management: pain level controlled Vital Signs Assessment: post-procedure vital signs reviewed and stable Respiratory status: spontaneous breathing, nonlabored ventilation, respiratory function stable and patient connected to nasal cannula oxygen Cardiovascular status: blood pressure returned to baseline and stable Postop Assessment: no apparent nausea or vomiting Anesthetic complications: no    Last Vitals:  Vitals:   08/23/18 1355 08/23/18 1520  BP: (!) 141/77 (!) 150/66  Pulse: 73 71  Resp: 18 16  Temp: (!) 36.4 C 36.5 C  SpO2: 95% 96%    Last Pain:  Vitals:   08/23/18 1520  TempSrc:   PainSc: 0-No pain                 Antone Summons

## 2018-08-23 NOTE — Op Note (Signed)
Inguinal Hernia, Open, Procedure Note  Pre-operative Diagnosis:  Right inguinal hernia, reducible  Post-operative Diagnosis: same  Surgeon:  Earnstine Regal, MD, FACS  Anesthesia:  General  Preparation:  Chlora-prep  Estimated Blood Loss: minimal  Complications:  none  Indications: Patient returns for follow-up of symptomatic right inguinal hernia. He is accompanied by his daughter. She acts as Optometrist. Patient continues to take Plavix and aspirin after undergoing aortic valve repair. He is scheduled to see his cardiologist, Dr. Mel Almond, in the near future. Patient continues to note the right inguinal hernia. It does cause him intermittent discomfort. He denies having any problems with his bowel movements and is not currently taking a laxative. He is interested in proceeding with hernia surgery in March of 2020 if possible.  Procedure Details  The patient was evaluated in the holding area. All of the patient's questions were answered and the proposed procedure was confirmed. The site of the procedure was properly marked. The patient was taken to the Operating Room, identified by name, and the procedure verified as inguinal hernia repair.  The patient was placed in the supine position and underwent induction of anesthesia. A "Time Out" was performed per routine. The lower abdomen and groin were prepped and draped in the usual aseptic fashion.  After ascertaining that an adequate level of anesthesia had been obtained, an incision was made in the groin with a #10 blade.  Dissection was carried through the subcutaneous tissues and hemostasis obtained with the electrocautery.  A Gelpi retractor was placed for exposure.  The external oblique fascia was incised in line with it's fibers and extended through the external inguinal ring.  The cord structures were dissected out of the inguinal canal and encircled with a Penrose drain.  The floor of the inguinal canal was dissected out.  There  is a large direct inguinal hernia present.  The sac is dissected away from the cord structures and reduced.  The defect in the floor of the canal is closed with interrupted 1-0 Novofil sutures.  The cord was explored and a large lipoma of the cord was excised at the level of the internal ring with 2-0 silk suture ligatures.  The floor of the inguinal canal was reconstructed with Ethicon Ultrapro mesh cut to the appropriate dimensions.  It was secured to the pubic tubercle with a 2-0 Novafil suture and along the inguinal ligament with a running 2-0 Novafil suture.  Mesh was split to accommodate the cord structures.  The superior margin of the mesh was secured to the transversalis and internal oblique musculature with interrupted 2-0 Novafil sutures.  The tails of the mesh were overlapped lateral to the cord structures and secured to the inguinal ligament with interrupted 2-0 Novafil sutures to recreate the internal inguinal ring.  Cord structures were returned to the inguinal canal.  Local anesthetic was infiltrated throughout the field.  External oblique fascia was closed with interrupted 3-0 Vicryl sutures.  Subcutaneous tissues were closed with interrupted 3-0 Vicryl sutures.  Skin was anesthetized with local anesthetic, and the skin edges were re-approximated with a running 4-0 Monocryl suture.  Wound was washed and dried and Dermabond was applied.  Instrument, sponge, and needle counts were correct prior to closure and at the conclusion of the case.  The patient tolerated the procedure well.  The patient was awakened from anesthesia and brought to the recovery room in stable condition.  Armandina Gemma, MD [facility] Office: [phone]

## 2018-08-23 NOTE — Interval H&P Note (Signed)
History and Physical Interval Note:  08/23/2018 11:05 AM  [name]  has presented today for surgery, with the diagnosis of right inguinal hernia, reducible.  The various methods of treatment have been discussed with the patient and family. After consideration of risks, benefits and other options for treatment, the patient has consented to    Procedure(s): OPEN RIGHT INGUINAL HERNIA REPAIR WITH MESH (Right) as a surgical intervention.    The patient's history has been reviewed, patient examined, no change in status, stable for surgery.  I have reviewed the patient's chart and labs.  Questions were answered to the patient's satisfaction.    Armandina Gemma, [place] [facility] Office: [place]

## 2018-08-23 NOTE — Anesthesia Procedure Notes (Signed)
Procedure Name: Intubation Date/Time: 08/23/2018 11:36 AM Performed by: Glory Buff, CRNA Pre-anesthesia Checklist: Patient identified, Emergency Drugs available, Suction available and Patient being monitored Patient Re-evaluated:Patient Re-evaluated prior to induction Oxygen Delivery Method: Circle system utilized Preoxygenation: Pre-oxygenation with 100% oxygen Induction Type: IV induction Ventilation: Mask ventilation without difficulty Laryngoscope Size: Miller and 3 Grade View: Grade I Tube type: Oral Number of attempts: 1 Airway Equipment and Method: Stylet Placement Confirmation: ETT inserted through vocal cords under direct vision,  positive ETCO2 and breath sounds checked- equal and bilateral Secured at: 22 cm Tube secured with: Tape Dental Injury: Teeth and Oropharynx as per pre-operative assessment

## 2018-08-24 ENCOUNTER — Encounter (HOSPITAL_COMMUNITY): Payer: Self-pay | Admitting: Surgery

## 2018-09-22 ENCOUNTER — Inpatient Hospital Stay (HOSPITAL_COMMUNITY)
Admission: EM | Admit: 2018-09-22 | Discharge: 2018-09-26 | DRG: 445 | Disposition: A | Discharge status: Home / Self Care | Payer: Medicare Other | Attending: Internal Medicine | Admitting: Internal Medicine

## 2018-09-22 ENCOUNTER — Other Ambulatory Visit: Payer: Self-pay

## 2018-09-22 ENCOUNTER — Emergency Department (HOSPITAL_COMMUNITY): Payer: Medicare Other

## 2018-09-22 ENCOUNTER — Encounter (HOSPITAL_COMMUNITY): Payer: Self-pay

## 2018-09-22 DIAGNOSIS — Z87891 Personal history of nicotine dependence: Secondary | ICD-10-CM

## 2018-09-22 DIAGNOSIS — R932 Abnormal findings on diagnostic imaging of liver and biliary tract: Secondary | ICD-10-CM

## 2018-09-22 DIAGNOSIS — K269 Duodenal ulcer, unspecified as acute or chronic, without hemorrhage or perforation: Secondary | ICD-10-CM | POA: Diagnosis present

## 2018-09-22 DIAGNOSIS — K8050 Calculus of bile duct without cholangitis or cholecystitis without obstruction: Secondary | ICD-10-CM | POA: Diagnosis not present

## 2018-09-22 DIAGNOSIS — K219 Gastro-esophageal reflux disease without esophagitis: Secondary | ICD-10-CM | POA: Diagnosis not present

## 2018-09-22 DIAGNOSIS — Z961 Presence of intraocular lens: Secondary | ICD-10-CM | POA: Diagnosis present

## 2018-09-22 DIAGNOSIS — R945 Abnormal results of liver function studies: Secondary | ICD-10-CM | POA: Diagnosis not present

## 2018-09-22 DIAGNOSIS — Z79890 Hormone replacement therapy (postmenopausal): Secondary | ICD-10-CM | POA: Diagnosis not present

## 2018-09-22 DIAGNOSIS — H9190 Unspecified hearing loss, unspecified ear: Secondary | ICD-10-CM | POA: Diagnosis present

## 2018-09-22 DIAGNOSIS — K8000 Calculus of gallbladder with acute cholecystitis without obstruction: Secondary | ICD-10-CM | POA: Diagnosis not present

## 2018-09-22 DIAGNOSIS — Z9842 Cataract extraction status, left eye: Secondary | ICD-10-CM

## 2018-09-22 DIAGNOSIS — Z6830 Body mass index (BMI) 30.0-30.9, adult: Secondary | ICD-10-CM

## 2018-09-22 DIAGNOSIS — I10 Essential (primary) hypertension: Secondary | ICD-10-CM | POA: Diagnosis not present

## 2018-09-22 DIAGNOSIS — I2510 Atherosclerotic heart disease of native coronary artery without angina pectoris: Secondary | ICD-10-CM | POA: Diagnosis not present

## 2018-09-22 DIAGNOSIS — E7800 Pure hypercholesterolemia, unspecified: Secondary | ICD-10-CM | POA: Diagnosis not present

## 2018-09-22 DIAGNOSIS — Z91012 Allergy to eggs: Secondary | ICD-10-CM

## 2018-09-22 DIAGNOSIS — Z7982 Long term (current) use of aspirin: Secondary | ICD-10-CM

## 2018-09-22 DIAGNOSIS — N400 Enlarged prostate without lower urinary tract symptoms: Secondary | ICD-10-CM | POA: Diagnosis not present

## 2018-09-22 DIAGNOSIS — R000 Tachycardia, unspecified: Secondary | ICD-10-CM | POA: Diagnosis not present

## 2018-09-22 DIAGNOSIS — R7989 Other specified abnormal findings of blood chemistry: Secondary | ICD-10-CM | POA: Diagnosis present

## 2018-09-22 DIAGNOSIS — I110 Hypertensive heart disease with heart failure: Secondary | ICD-10-CM | POA: Diagnosis not present

## 2018-09-22 DIAGNOSIS — R001 Bradycardia, unspecified: Secondary | ICD-10-CM | POA: Diagnosis present

## 2018-09-22 DIAGNOSIS — K8070 Calculus of gallbladder and bile duct without cholecystitis without obstruction: Secondary | ICD-10-CM | POA: Diagnosis not present

## 2018-09-22 DIAGNOSIS — E785 Hyperlipidemia, unspecified: Secondary | ICD-10-CM | POA: Diagnosis present

## 2018-09-22 DIAGNOSIS — Z20828 Contact with and (suspected) exposure to other viral communicable diseases: Secondary | ICD-10-CM | POA: Diagnosis not present

## 2018-09-22 DIAGNOSIS — R1013 Epigastric pain: Secondary | ICD-10-CM | POA: Diagnosis not present

## 2018-09-22 DIAGNOSIS — Z952 Presence of prosthetic heart valve: Secondary | ICD-10-CM | POA: Diagnosis not present

## 2018-09-22 DIAGNOSIS — Z87442 Personal history of urinary calculi: Secondary | ICD-10-CM

## 2018-09-22 DIAGNOSIS — M069 Rheumatoid arthritis, unspecified: Secondary | ICD-10-CM | POA: Diagnosis present

## 2018-09-22 DIAGNOSIS — E890 Postprocedural hypothyroidism: Secondary | ICD-10-CM | POA: Diagnosis present

## 2018-09-22 DIAGNOSIS — Z66 Do not resuscitate: Secondary | ICD-10-CM | POA: Diagnosis not present

## 2018-09-22 DIAGNOSIS — Z803 Family history of malignant neoplasm of breast: Secondary | ICD-10-CM

## 2018-09-22 DIAGNOSIS — R9431 Abnormal electrocardiogram [ECG] [EKG]: Secondary | ICD-10-CM | POA: Diagnosis not present

## 2018-09-22 DIAGNOSIS — Z79899 Other long term (current) drug therapy: Secondary | ICD-10-CM | POA: Diagnosis not present

## 2018-09-22 DIAGNOSIS — I350 Nonrheumatic aortic (valve) stenosis: Secondary | ICD-10-CM

## 2018-09-22 DIAGNOSIS — N179 Acute kidney failure, unspecified: Secondary | ICD-10-CM | POA: Diagnosis present

## 2018-09-22 DIAGNOSIS — K8020 Calculus of gallbladder without cholecystitis without obstruction: Secondary | ICD-10-CM | POA: Diagnosis not present

## 2018-09-22 DIAGNOSIS — R748 Abnormal levels of other serum enzymes: Secondary | ICD-10-CM | POA: Diagnosis not present

## 2018-09-22 DIAGNOSIS — Z7902 Long term (current) use of antithrombotics/antiplatelets: Secondary | ICD-10-CM

## 2018-09-22 DIAGNOSIS — I5032 Chronic diastolic (congestive) heart failure: Secondary | ICD-10-CM | POA: Diagnosis not present

## 2018-09-22 DIAGNOSIS — R0902 Hypoxemia: Secondary | ICD-10-CM | POA: Diagnosis not present

## 2018-09-22 DIAGNOSIS — I213 ST elevation (STEMI) myocardial infarction of unspecified site: Secondary | ICD-10-CM | POA: Diagnosis not present

## 2018-09-22 DIAGNOSIS — R079 Chest pain, unspecified: Secondary | ICD-10-CM | POA: Diagnosis not present

## 2018-09-22 DIAGNOSIS — Z9841 Cataract extraction status, right eye: Secondary | ICD-10-CM

## 2018-09-22 DIAGNOSIS — E669 Obesity, unspecified: Secondary | ICD-10-CM | POA: Diagnosis present

## 2018-09-22 DIAGNOSIS — Z972 Presence of dental prosthetic device (complete) (partial): Secondary | ICD-10-CM

## 2018-09-22 DIAGNOSIS — Z953 Presence of xenogenic heart valve: Secondary | ICD-10-CM | POA: Diagnosis not present

## 2018-09-22 DIAGNOSIS — Z888 Allergy status to other drugs, medicaments and biological substances status: Secondary | ICD-10-CM

## 2018-09-22 DIAGNOSIS — K831 Obstruction of bile duct: Secondary | ICD-10-CM | POA: Diagnosis not present

## 2018-09-22 LAB — TROPONIN I
Troponin I: <0.03 ng/mL (ref <0.03)
Troponin I: <0.03 ng/mL (ref <0.03)

## 2018-09-22 LAB — CBC WITH DIFFERENTIAL/PLATELET
Abs Immature Granulocytes: 0.01 10*3/uL (ref 0.00–0.07)
Basophils Absolute: 0 10*3/uL (ref 0.0–0.1)
Basophils Relative: 0 %
Eosinophils Absolute: 0.2 10*3/uL (ref 0.0–0.5)
Eosinophils Relative: 3 %
HCT: 40 % (ref 39.0–52.0)
Hemoglobin: 13 g/dL (ref 13.0–17.0)
Immature Granulocytes: 0 %
Lymphocytes Relative: 10 %
Lymphs Abs: 0.7 10*3/uL (ref 0.7–4.0)
MCH: 29.9 pg (ref 26.0–34.0)
MCHC: 32.5 g/dL (ref 30.0–36.0)
MCV: 92 fL (ref 80.0–100.0)
Monocytes Absolute: 0.6 10*3/uL (ref 0.1–1.0)
Monocytes Relative: 8 %
Neutro Abs: 5.7 10*3/uL (ref 1.7–7.7)
Neutrophils Relative %: 79 %
Platelets: DECREASED 10*3/uL (ref 150–400)
RBC: 4.35 MIL/uL (ref 4.22–5.81)
RDW: 12.8 % (ref 11.5–15.5)
WBC: 7.3 10*3/uL (ref 4.0–10.5)
nRBC: 0 % (ref 0.0–0.2)

## 2018-09-22 LAB — COMPREHENSIVE METABOLIC PANEL
ALT: 496 U/L — ABNORMAL HIGH (ref 0–44)
AST: 542 U/L — ABNORMAL HIGH (ref 15–41)
Albumin: 3.9 g/dL (ref 3.5–5.0)
Alkaline Phosphatase: 152 U/L — ABNORMAL HIGH (ref 38–126)
Anion gap: 12 (ref 5–15)
BUN: 11 mg/dL (ref 8–23)
CO2: 23 mmol/L (ref 22–32)
Calcium: 9 mg/dL (ref 8.9–10.3)
Chloride: 102 mmol/L (ref 98–111)
Creatinine, Ser: 1.04 mg/dL (ref 0.61–1.24)
GFR calc Af Amer: >60 mL/min (ref >60)
GFR calc non Af Amer: >60 mL/min (ref >60)
Glucose, Bld: 157 mg/dL — ABNORMAL HIGH (ref 70–99)
Potassium: 3.9 mmol/L (ref 3.5–5.1)
Sodium: 137 mmol/L (ref 135–145)
Total Bilirubin: 4.3 mg/dL — ABNORMAL HIGH (ref 0.3–1.2)
Total Protein: 7 g/dL (ref 6.5–8.1)

## 2018-09-22 LAB — URINALYSIS, ROUTINE W REFLEX MICROSCOPIC
Glucose, UA: NEGATIVE mg/dL
Hgb urine dipstick: NEGATIVE
Ketones, ur: NEGATIVE mg/dL
Leukocytes,Ua: NEGATIVE
Nitrite: NEGATIVE
Protein, ur: NEGATIVE mg/dL
Specific Gravity, Urine: 1.017 (ref 1.005–1.030)
pH: 7 (ref 5.0–8.0)

## 2018-09-22 LAB — LACTIC ACID, PLASMA
Lactic Acid, Venous: 1.6 mmol/L (ref 0.5–1.9)
Lactic Acid, Venous: 1.1 mmol/L (ref 0.5–1.9)

## 2018-09-22 LAB — LIPASE, BLOOD: Lipase: 41 U/L (ref 11–51)

## 2018-09-22 LAB — ACETAMINOPHEN LEVEL: Acetaminophen (Tylenol), Serum: <10 ug/mL — ABNORMAL LOW (ref 10–30)

## 2018-09-22 LAB — C-REACTIVE PROTEIN: CRP: 6 mg/dL — ABNORMAL HIGH (ref <1.0)

## 2018-09-22 LAB — BRAIN NATRIURETIC PEPTIDE: B Natriuretic Peptide: 142.5 pg/mL — ABNORMAL HIGH (ref 0.0–100.0)

## 2018-09-22 MED ORDER — METHOTREXATE 2.5 MG PO TABS: 22.5000 mg | ORAL_TABLET | ORAL | Status: DC

## 2018-09-22 MED ORDER — PIPERACILLIN-TAZOBACTAM 3.375 G IVPB
3.3750 g | Freq: Three times a day (TID) | INTRAVENOUS | Status: DC
  Administered 2018-09-22 – 2018-09-26 (×11): 3.375 g via INTRAVENOUS
  Filled 2018-09-22 (×13): qty 50

## 2018-09-22 MED ORDER — ONDANSETRON HCL 4 MG/2ML IJ SOLN
4.0000 mg | Freq: Four times a day (QID) | INTRAMUSCULAR | Status: DC | PRN
  Administered 2018-09-23: 23:00:00 4 mg via INTRAVENOUS
  Filled 2018-09-22: qty 2

## 2018-09-22 MED ORDER — PIPERACILLIN-TAZOBACTAM 3.375 G IVPB 30 MIN
3.3750 g | Freq: Once | INTRAVENOUS | Status: AC
  Administered 2018-09-22: 16:00:00 3.375 g via INTRAVENOUS
  Filled 2018-09-22: qty 50

## 2018-09-22 MED ORDER — ONDANSETRON HCL 4 MG/2ML IJ SOLN
4.0000 mg | Freq: Once | INTRAMUSCULAR | Status: AC
  Administered 2018-09-22: 4 mg via INTRAVENOUS
  Filled 2018-09-22: qty 2

## 2018-09-22 MED ORDER — DUTASTERIDE 0.5 MG PO CAPS
0.5000 mg | ORAL_CAPSULE | Freq: Every day | ORAL | Status: DC
  Administered 2018-09-22 – 2018-09-26 (×5): 0.5 mg via ORAL
  Filled 2018-09-22 (×6): qty 1

## 2018-09-22 MED ORDER — ESCITALOPRAM OXALATE 20 MG PO TABS
20.0000 mg | ORAL_TABLET | Freq: Every day | ORAL | Status: DC
  Administered 2018-09-22 – 2018-09-26 (×5): 20 mg via ORAL
  Filled 2018-09-22: qty 2
  Filled 2018-09-22 (×2): qty 1
  Filled 2018-09-22: qty 2
  Filled 2018-09-22: qty 1

## 2018-09-22 MED ORDER — ONDANSETRON HCL 4 MG PO TABS: 4.0000 mg | ORAL_TABLET | Freq: Four times a day (QID) | ORAL | Status: DC | PRN

## 2018-09-22 MED ORDER — FUROSEMIDE 20 MG PO TABS
20.0000 mg | ORAL_TABLET | Freq: Every day | ORAL | Status: DC
  Administered 2018-09-22: 20 mg via ORAL
  Filled 2018-09-22 (×2): qty 1

## 2018-09-22 MED ORDER — SODIUM CHLORIDE 0.9 % IV SOLN
INTRAVENOUS | Status: DC | PRN
  Administered 2018-09-22: 22:00:00 100 mL via INTRAVENOUS

## 2018-09-22 MED ORDER — ENOXAPARIN SODIUM 40 MG/0.4ML ~~LOC~~ SOLN
40.0000 mg | SUBCUTANEOUS | Status: DC
  Administered 2018-09-22 – 2018-09-25 (×4): 40 mg via SUBCUTANEOUS
  Filled 2018-09-22 (×4): qty 0.4

## 2018-09-22 MED ORDER — KETOROLAC TROMETHAMINE 30 MG/ML IJ SOLN
15.0000 mg | Freq: Once | INTRAMUSCULAR | Status: AC
  Administered 2018-09-22: 15:00:00 15 mg via INTRAVENOUS
  Filled 2018-09-22: qty 1

## 2018-09-22 MED ORDER — FOLIC ACID 1 MG PO TABS
1.0000 mg | ORAL_TABLET | Freq: Every evening | ORAL | Status: DC
  Administered 2018-09-22 – 2018-09-25 (×3): 1 mg via ORAL
  Filled 2018-09-22 (×3): qty 1

## 2018-09-22 MED ORDER — OXYCODONE HCL 5 MG PO TABS
5.0000 mg | ORAL_TABLET | ORAL | Status: DC | PRN
  Administered 2018-09-23: 5 mg via ORAL
  Filled 2018-09-22: qty 1

## 2018-09-22 MED ORDER — LOSARTAN POTASSIUM 25 MG PO TABS
25.0000 mg | ORAL_TABLET | Freq: Every day | ORAL | Status: DC
  Administered 2018-09-22: 25 mg via ORAL
  Filled 2018-09-22 (×2): qty 1

## 2018-09-22 NOTE — H&P (Signed)
History and Physical    [name] CWC:[id number] DOB: 1928-02-20  DOA: 09/22/2018 PCP: Lajean Manes, MD  Patient coming from: Home   Chief Complaint: SOB with nausea and vomiting.   HPI: [name] is a 85 y.o. male with medical history significant of severe aortic stenosis status post TAVR, hypertension, BPH, diastolic CHF,GERD rheumatoid arthritis on methotrexate who presented to the emergency department complaining of chest pain/shortness of breath with nausea and vomiting.  Patient is Russian-speaking and history was obtained via daughter through phone call who is a physician.  Apparently patient developed slight shortness of breath yesterday along with nausea and vomiting.  Shortness of breath has improved now however he was vomiting last night.  This morning patient developed chest/epigastric pain and continues to be severely nauseous.  Patient's daughter reported patient denies any fever, cough, weakness, COVID-19 positive contacts and recent travel.  Patient family called his PCP who recommended to be evaluated in the ED.  ED Course: Upon ED evaluation work-up revealed elevated LFTs, BNP 142, Right upper quadrant ultrasound shows no evidence of cholecystitis however there is a hyperechoic masslike structure within the gallbladder measures 1.3 cm possible sludge.  No bile duct dilation.  Probable liver cirrhosis.  Chest x-ray with no signs of cardiopulmonary disease.  While in the ED patient developed fever T-max of 1.1.  Patient feels that his shortness of breath has significantly improved and his main concern now is abdominal pain and nausea.  Patient was treated with Zosyn.  GI was consulted and recommended MRCP. [facility] called for admission.   Review of Systems:    General: no changes in body weight, no fever chills or decrease in energy.  HEENT: no blurry vision, hearing changes or sore throat Respiratory: no coughing, wheezing CV: no chest pain, no palpitations GI:  See HPI GU: no dysuria, burning on urination, increased urinary frequency, hematuria  Ext:. No deformities,  Neuro: no unilateral weakness, numbness, or tingling, no vision change or hearing loss Skin: No rashes, lesions or wounds. MSK: No muscle spasm, no deformity, no limitation of range of movement in spin Heme: No easy bruising.  Travel history: No recent long distant travel.   Past Medical History:  Diagnosis Date  . BPH (benign prostatic hypertrophy)   . Depression   . Diverticulosis of colon   . GERD (gastroesophageal reflux disease)   . Gilbert's syndrome   . History of kidney stones   . HLD (hyperlipidemia)   . HOH (hard of hearing)    refuses to wear his Hearing Aid  . Hypertension   . Inguinal hernia    right  . Internal hemorrhoid   . Normal coronary arteries    a. Cath 07/2010: normal coronaries. Felt to have noncardiac CP/SOB at that time possibly r/t anxiety.  . RA (rheumatoid arthritis) ([place])    bilateral hands/ wrist--  seronegative  . S/P TAVR (transcatheter aortic valve replacement) 03/27/2018   23 mm Edwards Sapien 3 transcatheter heart valve placed via percutaneous right transfemoral approach   . Severe aortic stenosis   . Thyroid nodule    noted 11-2009  . Wears dentures     Past Surgical History:  Procedure Laterality Date  . CARDIAC CATHETERIZATION  07-19-2010  dr Tressia Miners turner   normal coronary arteries,  ef 60%,  moderate aortic stenosis- gradient 63mmHg, normal right heart pressure  . CARDIOVASCULAR STRESS TEST  05/ 2011   dr Marlou Porch   normal low risk perfusion study  . CATARACT EXTRACTION W/  INTRAOCULAR LENS  IMPLANT, BILATERAL  2013  . CATARACT EXTRACTION, BILATERAL    . COLONOSCOPY  2010  approx  . INGUINAL HERNIA REPAIR Right 08/23/2018   Procedure: OPEN RIGHT INGUINAL HERNIA REPAIR WITH MESH;  Surgeon: Armandina Gemma, MD;  Location: [facility];  Service: General;  Laterality: Right;  . INTRAOPERATIVE TRANSTHORACIC ECHOCARDIOGRAM N/A 03/27/2018    Procedure: INTRAOPERATIVE TRANSTHORACIC ECHOCARDIOGRAM;  Surgeon: Burnell Blanks, MD;  Location: [facility];  Service: Open Heart Surgery;  Laterality: N/A;  . RIGHT/LEFT HEART CATH AND CORONARY ANGIOGRAPHY N/A 02/06/2018   Procedure: RIGHT/LEFT HEART CATH AND CORONARY ANGIOGRAPHY;  Surgeon: Burnell Blanks, MD;  Location: [facility];  Service: Cardiovascular;  Laterality: N/A;  . THYROID LOBECTOMY Right 05/05/2015   Procedure: RIGHT THYROID LOBECTOMY;  Surgeon: Armandina Gemma, MD;  Location: [facility];  Service: General;  Laterality: Right;  . TRANSANAL HEMORRHOIDAL DEARTERIALIZATION N/A 04/09/2014   Procedure: TRANSANAL HEMORRHOIDAL DEARTERIALIZATION OF INTERNAL HEMORROIDS;  Surgeon: Leighton Ruff, MD;  Location: [facility];  Service: General;  Laterality: N/A;  . TRANSCATHETER AORTIC VALVE REPLACEMENT, TRANSFEMORAL  03/27/2018  . TRANSCATHETER AORTIC VALVE REPLACEMENT, TRANSFEMORAL N/A 03/27/2018   Procedure: TRANSCATHETER AORTIC VALVE REPLACEMENT, TRANSFEMORAL. Edwards SAPIEN 3 Transcatheter Heart Valve 51mm.;  Surgeon: Burnell Blanks, MD;  Location: [facility];  Service: Open Heart Surgery;  Laterality: N/A;  . TRANSTHORACIC ECHOCARDIOGRAM  11-26-2013   moderate focal basal and mild LVH/  ef 08-81%/  grade I diastolic dysfunction/ mild LAE/  moderate calcification with stenosis AV with mild regurg,  gradients 35 abd 58 mmHg /  mild TR     reports that he quit smoking about 37 years ago. His smoking use included cigarettes. He started smoking about 45 years ago. He quit after 40.00 years of use. He has never used smokeless tobacco. He reports current alcohol use. He reports that he does not use drugs.  Allergies  Allergen Reactions  . Ativan [Lorazepam] Other (See Comments)    hallucinations  . Phenergan [Promethazine Hcl] Hypertension  . Eggs Or Egg-Derived Products Rash    Small rash after eating for several days in a row    Family History  Problem  Relation Age of Onset  . Pulmonary embolism Mother 48       caused by complications of surgery  . CVA Father   . Diabetes Brother   . Diabetes Brother   . Breast cancer Sister    Family history reviewed and nonpertinent.  Prior to Admission medications   Medication Sig Start Date End Date Taking? Authorizing Provider  Ascorbic Acid (VITAMIN C) 1000 MG tablet Take 1,000 mg by mouth daily after lunch.   Yes [provider]  aspirin 81 MG tablet Take 1 tablet (81 mg total) by mouth daily. Patient taking differently: Take 81 mg by mouth daily.  06/05/18  Yes Barrett, Evelene Croon, PA-C  cholecalciferol (VITAMIN D3) 25 MCG (1000 UT) tablet Take 1,000 Units by mouth daily.   Yes [provider]  dutasteride (AVODART) 0.5 MG capsule Take 0.5 mg by mouth daily.   Yes [provider]  escitalopram (LEXAPRO) 20 MG tablet Take 1 tablet (20 mg total) by mouth daily. Patient taking differently: Take 20 mg by mouth at bedtime.  09/06/16  Yes Angiulli, Lavon Paganini, PA-C  folic acid (FOLVITE) 1 MG tablet Take 1 mg by mouth every evening.  01/27/15  Yes [provider]  furosemide (LASIX) 20 MG tablet Take 20 mg by mouth.  Yes [provider]  ibuprofen (ADVIL,MOTRIN) 800 MG tablet Take 800 mg by mouth every morning.   Yes [provider]  losartan (COZAAR) 25 MG tablet Take 25 mg by mouth daily.  01/21/18  Yes [provider]  Melatonin 5 MG TABS Take 5 mg by mouth at bedtime as needed (for sleep).    Yes [provider]  methotrexate (RHEUMATREX) 2.5 MG tablet Take 22.5 mg by mouth every Sunday.  07/13/13  Yes [provider]  Omega-3 Fatty Acids (FISH OIL) 1000 MG CAPS Take 1,000 mg by mouth daily.   Yes [provider]  simvastatin (ZOCOR) 20 MG tablet TAKE 1 TABLET BY MOUTH EVERY NIGHT AT BEDTIME Patient taking differently: Take 20 mg by mouth every evening.  05/06/14  Yes Jerline Pain, MD  traMADol (ULTRAM) 50 MG  tablet Take 1-2 tablets (50-100 mg total) by mouth every 6 (six) hours as needed. Patient taking differently: Take 50-100 mg by mouth every 6 (six) hours as needed for moderate pain.  08/23/18  Yes Armandina Gemma, MD  vitamin E 400 UNIT capsule Take 400 Units by mouth daily after lunch.   Yes [provider]  clopidogrel (PLAVIX) 75 MG tablet Take 1 tablet (75 mg total) by mouth daily with breakfast. Patient not taking: Reported on 09/22/2018 03/29/18   Eileen Stanford, PA-C    Physical Exam: Vitals:   09/22/18 1515 09/22/18 1530 09/22/18 1545 09/22/18 1716  BP: 135/67 133/62 (!) 119/59   Pulse: 77 78 77 73  Resp: (!) 29 (!) 29 (!) 26   Temp:    98.6 F (37 C)  TempSrc:    Oral  SpO2: 93% 91% 93% 95%  Weight:      Height:       Constitutional: NAD, calm, comfortable Eyes: PERRL, lids and conjunctivae normal ENMT: Mucous membranes are moist. Posterior pharynx clear of any exudate or lesions. Neck: normal, supple, no masses, no thyromegaly Respiratory: clear to auscultation bilaterally, no wheezing, no crackles. Normal respiratory effort.  Cardiovascular: Regular rate and rhythm, positive systolic murmur Abdomen: Mild right upper quadrant tenderness, no palpable mass, no rebound or guarding.  Positive bowel sounds.  Musculoskeletal: no clubbing / cyanosis. No joint deformity upper and lower extremities. Good ROM, no contractures. Normal muscle tone.  Skin: no rashes, lesions, ulcers. No induration Neurologic: CN 2-12 grossly intact. Sensation intact, DTR normal. Strength 5/5 in all 4.  Psychiatric: Normal judgment and insight. Alert and oriented x 3. Normal mood.   Labs on Admission: I have personally reviewed following labs and imaging studies  CBC: Recent Labs  Lab 09/22/18 1044  WBC 7.3  NEUTROABS 5.7  HGB 13.0  HCT 40.0  MCV 92.0  PLT PLATELET CLUMPS NOTED ON SMEAR, COUNT APPEARS DECREASED   Basic Metabolic Panel: Recent Labs  Lab 09/22/18 1044  NA 137  K  3.9  CL 102  CO2 23  GLUCOSE 157*  BUN 11  CREATININE 1.04  CALCIUM 9.0   GFR: Estimated Creatinine Clearance: 45.5 mL/min (by C-G formula based on SCr of 1.04 mg/dL). Liver Function Tests: Recent Labs  Lab 09/22/18 1044  AST 542*  ALT 496*  ALKPHOS 152*  BILITOT 4.3*  PROT 7.0  ALBUMIN 3.9   Recent Labs  Lab 09/22/18 1044  LIPASE 41   No results for input(s): AMMONIA in the last 168 hours. Coagulation Profile: No results for input(s): INR, PROTIME in the last 168 hours. Cardiac Enzymes: Recent Labs  Lab 09/22/18  1044 09/22/18 1339  TROPONINI <0.03 <0.03   BNP (last 3 results) No results for input(s): PROBNP in the last 8760 hours. HbA1C: No results for input(s): HGBA1C in the last 72 hours. CBG: No results for input(s): GLUCAP in the last 168 hours. Lipid Profile: No results for input(s): CHOL, HDL, LDLCALC, TRIG, CHOLHDL, LDLDIRECT in the last 72 hours. Thyroid Function Tests: No results for input(s): TSH, T4TOTAL, FREET4, T3FREE, THYROIDAB in the last 72 hours. Anemia Panel: No results for input(s): VITAMINB12, FOLATE, FERRITIN, TIBC, IRON, RETICCTPCT in the last 72 hours. Urine analysis:    Component Value Date/Time   COLORURINE AMBER (A) 09/22/2018 1442   APPEARANCEUR CLEAR 09/22/2018 1442   LABSPEC 1.017 09/22/2018 1442   PHURINE 7.0 09/22/2018 1442   GLUCOSEU NEGATIVE 09/22/2018 1442   HGBUR NEGATIVE 09/22/2018 1442   BILIRUBINUR SMALL (A) 09/22/2018 1442   KETONESUR NEGATIVE 09/22/2018 1442   PROTEINUR NEGATIVE 09/22/2018 1442   UROBILINOGEN 1.0 04/14/2014 0116   NITRITE NEGATIVE 09/22/2018 1442   LEUKOCYTESUR NEGATIVE 09/22/2018 1442   Sepsis Labs: !!!!!!!!!!!!!!!!!!!!!!!!!!!!!!!!!!!!!!!!!!!! @LABRCNTIP (procalcitonin:4,lacticidven:4) ) Recent Results (from the past 240 hour(s))  Blood culture (routine x 2)     Status: None (Preliminary result)   Collection Time: 09/22/18  3:09 PM  Result Value Ref Range Status   Specimen Description  BLOOD LEFT FOREARM  Final   Special Requests   Final    NONE Performed at [facility], [place] [address] [place], [place] [address]    Culture PENDING  Incomplete   Report Status PENDING  Incomplete  Blood culture (routine x 2)     Status: None (Preliminary result)   Collection Time: 09/22/18  3:14 PM  Result Value Ref Range Status   Specimen Description BLOOD RIGHT WRIST  Final   Special Requests   Final    NONE Performed at [facility], [place] [address] [place], [place] [address]    Culture PENDING  Incomplete   Report Status PENDING  Incomplete     Radiological Exams on Admission: Dg Chest Port 1 View  Result Date: 09/22/2018 CLINICAL DATA:  Acute onset of mid chest pain that began last night. Current history of CHF and hypertension. Prior [place]. EXAM: PORTABLE CHEST 1 VIEW COMPARISON:  CTA chest 06/04/2018. Chest x-rays 06/04/2018 and earlier. FINDINGS: Cardiac silhouette normal in size, unchanged. Prior CABG. Thoracic aorta mildly atherosclerotic and tortuous. Hilar and mediastinal contours otherwise unremarkable. Mildly prominent bronchovascular markings diffusely and mild central peribronchial thickening, unchanged. Lungs otherwise clear. No localized airspace consolidation. No pleural effusions. No pneumothorax. Normal pulmonary vascularity. IMPRESSION: Stable mild changes of chronic bronchitis and/or asthma. No acute cardiopulmonary disease. Electronically Signed   By: Evangeline Dakin M.D.   On: 09/22/2018 11:25   US Abdomen Limited Ruq  Result Date: 09/22/2018 CLINICAL DATA:  Elevated liver function test. EXAM: ULTRASOUND ABDOMEN LIMITED RIGHT UPPER QUADRANT COMPARISON:  None. FINDINGS: Gallbladder: Mixed echogenicity material within the gallbladder, presumably sludge. Hyperechoic masslike focus within the gallbladder measures 1.3 x 1.2 cm, most likely tumefactive sludge. No gallbladder wall thickening or pericholecystic fluid. No sonographic Murphy's sign elicited  during the exam. Common bile duct: Diameter: 5 mm Liver: Liver echotexture is coarse and there is nodularity of the peripheral liver margins suggesting cirrhosis. No focal mass or lesion within the liver. Portal vein is patent on color Doppler imaging with normal direction of blood flow towards the liver. Incidental note of a RIGHT renal cyst, measuring 3.6 cm, corresponding to a benign cyst  demonstrated on CT dated 02/07/2018. IMPRESSION: 1. No evidence of cholecystitis. No discrete gallstones identified. Hyperechoic masslike structure within the gallbladder measures 1.3 cm, most likely tumefactive sludge. Additional layering sludge within the gallbladder. 2. No bile duct dilatation. 3. Probable liver cirrhosis. Electronically Signed   By: Franki Cabot M.D.   On: 09/22/2018 13:22    EKG: Independently reviewed.   Assessment/Plan Elevated LFTs Unclear etiology at this time, will admit to [facility] for further evaluation.  Ultrasound with sludge/mass, patient now developed fever ?  Cholangitis.  Will treat empirically with Zosyn, ?  Viral etiology vs medications. Will check hepatitis panel.  Patient on starting on methotrexate.  MRCP has been ordered and GI has been consulted.  Zofran for nausea as needed.  Oxycodone for pain as needed.  Shortness of breath Very confusing picture here as patient has history of TAVR and diastolic CHF.  However given patient immunocompromised and developed a fever with mild tachypnea, will need to rule out COVID-19. I have sent respiratory panel and COVID-19 test.  His chest x-ray does not show any signs of pneumonia, fluid overload, his BNP is only 142 and he is not requiring any oxygen, therefore low risk for COVID-19. Will placed on droplet and contact precaution.  HTN BP stable, continue to home meds.   BPH  Stable, continue home meds  RA On methotrexate will hold for now due to elevated LFT's   HLD Hold statin  DVT prophylaxis: Lovenox  Code Status: DNR,  confirmed by granddaughter. Family Communication: Granddaughter, Dr. Loma Messing [phone] Disposition Plan: Anticipate discharge to previous home environment.  Consults called: GI, Dr. Therisa Doyne called by EDP Admission status: Inpatient/MedSurg  Chipper Oman MD [facility] Pager: Text Page via [email or web address]   If 7PM-7AM, please contact night-coverage [email or web address]  09/22/2018, 5:26 PM

## 2018-09-22 NOTE — ED Notes (Signed)
ED Provider at bedside. 

## 2018-09-22 NOTE — Progress Notes (Signed)
NURSING PROGRESS NOTE  Giulio Bertino [id number] Admission Data: 09/22/2018 7:21 PM Attending Provider: Patrecia Pour, Christean Grief, MD IRJ:JOACZYSAY, Hal, MD Code Status: DNR     Daevion Navarette is a 85 y.o. male patient admitted from ED:  -No acute distress noted.  -No complaints of shortness of breath.  -No complaints of chest pain.   Cardiac Monitoring:  Blood pressure (!) 138/59, pulse 66, temperature 98.6 F (37 C), temperature source Oral, resp. rate (!) 26, height 5' 2.99" (1.6 m), weight 78.5 kg, SpO2 97 %.   IV Fluids:  IV in place, occlusive dsg intact without redness, IV cath forearm left, condition patent and no redness none.   Allergies:  Ativan [lorazepam]; Phenergan [promethazine hcl]; and Eggs or egg-derived products  Past Medical History:   has a past medical history of BPH (benign prostatic hypertrophy), Depression, Diverticulosis of colon, GERD (gastroesophageal reflux disease), Gilbert's syndrome, History of kidney stones, HLD (hyperlipidemia), HOH (hard of hearing), Hypertension, Inguinal hernia, Internal hemorrhoid, Normal coronary arteries, RA (rheumatoid arthritis) ([place]), S/P TAVR (transcatheter aortic valve replacement) (03/27/2018), Severe aortic stenosis, Thyroid nodule, and Wears dentures.  Past Surgical History:   has a past surgical history that includes Cardiovascular stress test (05/ 2011   dr Marlou Porch); transthoracic echocardiogram (11-26-2013); Cardiac catheterization (07-19-2010  dr Tressia Miners turner); Colonoscopy (2010  approx); Cataract extraction w/ intraocular lens  implant, bilateral (2013); Transanal hemorrhoidal dearterialization (N/A, 04/09/2014); Thyroid lobectomy (Right, 05/05/2015); RIGHT/LEFT HEART CATH AND CORONARY ANGIOGRAPHY (N/A, 02/06/2018); Cataract extraction, bilateral; Transcatheter aortic valve replacement, transfemoral (03/27/2018); Transcatheter aortic valve replacement, transfemoral (N/A, 03/27/2018); Intraoperative transthoracic echocardiogram (N/A,  03/27/2018); and Inguinal hernia repair (Right, 08/23/2018).  Social History:   reports that he quit smoking about 37 years ago. His smoking use included cigarettes. He started smoking about 45 years ago. He quit after 40.00 years of use. He has never used smokeless tobacco. He reports current alcohol use. He reports that he does not use drugs.  Skin: intact, dry, flaky  Patient/Family orientated to room. Information packet given to patient/family. Admission inpatient armband information verified with patient/family to include name and date of birth and placed on patient arm. Side rails up x 2, fall assessment and education completed with patient/family. Patient/family able to verbalize understanding of risk associated with falls and verbalized understanding to call for assistance before getting out of bed. Call light within reach. Patient/family able to voice and demonstrate understanding of unit orientation instructions via interpretor.

## 2018-09-22 NOTE — ED Triage Notes (Signed)
Pt arrives with [facility] from c/o chest pain. Pt was given 324 aspirin and 1 tab nitro SL. Pt has hx of CHF. BP prior to nitro was 168/60; after nitro 118/80. Pt a&o x4. VSS at this time.

## 2018-09-22 NOTE — ED Provider Notes (Signed)
Medical screening examination/treatment/procedure(s) were conducted as a shared visit with non-physician practitioner(s) and myself.  I personally evaluated the patient during the encounter. Briefly, the patient is a 85 y.o. male with history of aortic stenosis status post TAVR, depression, hypertension, recent heart catheterization that showed overall normal coronaries with mild disease who presents the ED with chest pain, shortness of breath.  Patient with unremarkable vitals.  No fever.  Patient currently with no chest pain.  Describes more of a burning chest pain earlier this morning.  Has had some shortness of breath but no cough, no sputum production.  It appears that patient is concerned for coronavirus.  He has no signs of respiratory distress.  No fever.  No known sick contacts.  Overall is very well-appearing.  Exam is unremarkable.  Will evaluate with labs, BNP, troponin, EKG.  Will get chest x-ray.  Possibly mild heart failure exacerbation.  Possible mild viral process.  EKG shows sinus rhythm.  No signs of ischemic changes.  Unchanged from prior EKG.  Patient with no significant anemia, leukocytosis, electrolyte abnormality.  Lipase normal and doubt pancreatitis.  Patient does have elevation of liver enzymes and bilirubin.  Does have a history of Gilbert's disease.  Did have some tenderness on exam and will evaluate with a right upper quadrant ultrasound.  Chest x-ray showed no signs of pneumonia, pneumothorax, pleural effusion.  Could be elevated from possible viral process.  Will rule out surgical cause.  Patient does not have fever, no white count.  RUQ u/s shows no evidence of cholecystitis, however there was a hyperechoic masslike structure within the gallbladder that could be sludge.  Patient ended up spiking a fever and now possibly concern for cholangitis.  Will cover with IV antibiotics.  Will collect blood cultures.  GI has been consulted and recommends MRCP.  Patient to be admitted to  medicine service for further care.  Hemodynamically stable throughout her care.  Less likely coronavirus at this time given source for likely GI infection.  [name] was evaluated in Emergency Department on 09/22/2018 for the symptoms described in the history of present illness. He was evaluated in the context of the global COVID-19 pandemic, which necessitated consideration that the patient might be at risk for infection with the SARS-CoV-2 virus that causes COVID-19. Institutional protocols and algorithms that pertain to the evaluation of patients at risk for COVID-19 are in a state of rapid change based on information released by regulatory bodies including the CDC and federal and state organizations. These policies and algorithms were followed during the patient's care in the ED.  This chart was dictated using voice recognition software.  Despite best efforts to proofread,  errors can occur which can change the documentation meaning.     EKG Interpretation  Date/Time:  Saturday September 22 2018 10:11:57 EDT Ventricular Rate:  75 PR Interval:    QRS Duration: 87 QT Interval:  366 QTC Calculation: 409 R Axis:   -7 Text Interpretation:  Sinus rhythm Confirmed by Lennice Sites [phone]) on 09/22/2018 10:19:34 AM           Lennice Sites, DO 09/22/18 1513

## 2018-09-22 NOTE — ED Notes (Signed)
ED TO INPATIENT HANDOFF REPORT  ED Nurse Name and Phone #: [phone]  S Name/Age/Gender [name] 85 y.o. male Room/Bed: 031C/031C  Code Status   Code Status: DNR  Home/SNF/Other Home Patient oriented to: self, place, time and situation Is this baseline? Yes   Triage Complete: Triage complete  Chief Complaint Chest Pain  Triage Note Pt arrives with [facility] from c/o chest pain. Pt was given 324 aspirin and 1 tab nitro SL. Pt has hx of CHF. BP prior to nitro was 168/60; after nitro 118/80. Pt a&o x4. VSS at this time.   Allergies Allergies  Allergen Reactions  . Ativan [Lorazepam] Other (See Comments)    hallucinations  . Phenergan [Promethazine Hcl] Hypertension  . Eggs Or Egg-Derived Products Rash    Small rash after eating for several days in a row    Level of Care/Admitting Diagnosis ED Disposition    ED Disposition Condition [facility] Area: [place] [[id number]]  Level of Care: Med-Surg [16]  Diagnosis: Elevated LFTs [321910]  Admitting Physician: Patrecia Pour, EDWIN [[id number]]  Attending Physician: Patrecia Pour, EDWIN [[id number]]  Estimated length of stay: past midnight tomorrow  Certification:: I certify this patient will need inpatient services for at least 2 midnights  PT Class (Do Not Modify): Inpatient [101]  PT Acc Code (Do Not Modify): Private [1]       B Medical/Surgery History Past Medical History:  Diagnosis Date  . BPH (benign prostatic hypertrophy)   . Depression   . Diverticulosis of colon   . GERD (gastroesophageal reflux disease)   . Gilbert's syndrome   . History of kidney stones   . HLD (hyperlipidemia)   . HOH (hard of hearing)    refuses to wear his Hearing Aid  . Hypertension   . Inguinal hernia    right  . Internal hemorrhoid   . Normal coronary arteries    a. Cath 07/2010: normal coronaries. Felt to have noncardiac CP/SOB at that time possibly r/t anxiety.  . RA (rheumatoid arthritis)  ([place])    bilateral hands/ wrist--  seronegative  . S/P TAVR (transcatheter aortic valve replacement) 03/27/2018   23 mm Edwards Sapien 3 transcatheter heart valve placed via percutaneous right transfemoral approach   . Severe aortic stenosis   . Thyroid nodule    noted 11-2009  . Wears dentures    Past Surgical History:  Procedure Laterality Date  . CARDIAC CATHETERIZATION  07-19-2010  dr Tressia Miners turner   normal coronary arteries,  ef 60%,  moderate aortic stenosis- gradient 16mmHg, normal right heart pressure  . CARDIOVASCULAR STRESS TEST  05/ 2011   dr Marlou Porch   normal low risk perfusion study  . CATARACT EXTRACTION W/ INTRAOCULAR LENS  IMPLANT, BILATERAL  2013  . CATARACT EXTRACTION, BILATERAL    . COLONOSCOPY  2010  approx  . INGUINAL HERNIA REPAIR Right 08/23/2018   Procedure: OPEN RIGHT INGUINAL HERNIA REPAIR WITH MESH;  Surgeon: Armandina Gemma, MD;  Location: [facility];  Service: General;  Laterality: Right;  . INTRAOPERATIVE TRANSTHORACIC ECHOCARDIOGRAM N/A 03/27/2018   Procedure: INTRAOPERATIVE TRANSTHORACIC ECHOCARDIOGRAM;  Surgeon: Burnell Blanks, MD;  Location: [facility];  Service: Open Heart Surgery;  Laterality: N/A;  . RIGHT/LEFT HEART CATH AND CORONARY ANGIOGRAPHY N/A 02/06/2018   Procedure: RIGHT/LEFT HEART CATH AND CORONARY ANGIOGRAPHY;  Surgeon: Burnell Blanks, MD;  Location: [facility];  Service: Cardiovascular;  Laterality: N/A;  . THYROID LOBECTOMY Right 05/05/2015   Procedure:  RIGHT THYROID LOBECTOMY;  Surgeon: Armandina Gemma, MD;  Location: [facility];  Service: General;  Laterality: Right;  . TRANSANAL HEMORRHOIDAL DEARTERIALIZATION N/A 04/09/2014   Procedure: TRANSANAL HEMORRHOIDAL DEARTERIALIZATION OF INTERNAL HEMORROIDS;  Surgeon: Leighton Ruff, MD;  Location: [facility];  Service: General;  Laterality: N/A;  . TRANSCATHETER AORTIC VALVE REPLACEMENT, TRANSFEMORAL  03/27/2018  . TRANSCATHETER AORTIC VALVE REPLACEMENT, TRANSFEMORAL N/A  03/27/2018   Procedure: TRANSCATHETER AORTIC VALVE REPLACEMENT, TRANSFEMORAL. Edwards SAPIEN 3 Transcatheter Heart Valve 48mm.;  Surgeon: Burnell Blanks, MD;  Location: [facility];  Service: Open Heart Surgery;  Laterality: N/A;  . TRANSTHORACIC ECHOCARDIOGRAM  11-26-2013   moderate focal basal and mild LVH/  ef 18-29%/  grade I diastolic dysfunction/ mild LAE/  moderate calcification with stenosis AV with mild regurg,  gradients 35 abd 58 mmHg /  mild TR     A IV Location/Drains/Wounds Patient Lines/Drains/Airways Status   Active Line/Drains/Airways    Name:   Placement date:   Placement time:   Site:   Days:   Peripheral IV 09/22/18 Left Forearm   09/22/18    1507    Forearm   less than 1   Incision (Closed) 08/23/18 Abdomen Right   08/23/18    1238     30          Intake/Output Last 24 hours No intake or output data in the 24 hours ending 09/22/18 1611  Labs/Imaging Results for orders placed or performed during the hospital encounter of 09/22/18 (from the past 48 hour(s))  CBC with Differential     Status: None   Collection Time: 09/22/18 10:44 AM  Result Value Ref Range   WBC 7.3 4.0 - 10.5 K/uL   RBC 4.35 4.22 - 5.81 MIL/uL   Hemoglobin 13.0 13.0 - 17.0 g/dL   HCT 40.0 39.0 - 52.0 %   MCV 92.0 80.0 - 100.0 fL   MCH 29.9 26.0 - 34.0 pg   MCHC 32.5 30.0 - 36.0 g/dL   RDW 12.8 11.5 - 15.5 %   Platelets  150 - 400 K/uL    PLATELET CLUMPS NOTED ON SMEAR, COUNT APPEARS DECREASED   nRBC 0.0 0.0 - 0.2 %   Neutrophils Relative % 79 %   Neutro Abs 5.7 1.7 - 7.7 K/uL   Lymphocytes Relative 10 %   Lymphs Abs 0.7 0.7 - 4.0 K/uL   Monocytes Relative 8 %   Monocytes Absolute 0.6 0.1 - 1.0 K/uL   Eosinophils Relative 3 %   Eosinophils Absolute 0.2 0.0 - 0.5 K/uL   Basophils Relative 0 %   Basophils Absolute 0.0 0.0 - 0.1 K/uL   Immature Granulocytes 0 %   Abs Immature Granulocytes 0.01 0.00 - 0.07 K/uL    Comment: Performed at [facility], [address],  [place], [place] [address]  Lipase, blood     Status: None   Collection Time: 09/22/18 10:44 AM  Result Value Ref Range   Lipase 41 11 - 51 U/L    Comment: Performed at [facility] [address] [place],  [address]  Comprehensive metabolic panel     Status: Abnormal   Collection Time: 09/22/18 10:44 AM  Result Value Ref Range   Sodium 137 135 - 145 mmol/L   Potassium 3.9 3.5 - 5.1 mmol/L   Chloride 102 98 - 111 mmol/L   CO2 23 22 - 32 mmol/L   Glucose, Bld 157 (H) 70 - 99 mg/dL   BUN  11 8 - 23 mg/dL   Creatinine, Ser 1.04 0.61 - 1.24 mg/dL   Calcium 9.0 8.9 - 10.3 mg/dL   Total Protein 7.0 6.5 - 8.1 g/dL   Albumin 3.9 3.5 - 5.0 g/dL   AST 542 (H) 15 - 41 U/L   ALT 496 (H) 0 - 44 U/L   Alkaline Phosphatase 152 (H) 38 - 126 U/L   Total Bilirubin 4.3 (H) 0.3 - 1.2 mg/dL   GFR calc non Af Amer >60 >60 mL/min   GFR calc Af Amer >60 >60 mL/min   Anion gap 12 5 - 15    Comment: Performed at [facility], [place] [address] [place], [place] [address]  Troponin I -     Status: None   Collection Time: 09/22/18 10:44 AM  Result Value Ref Range   Troponin I <0.03 <0.03 ng/mL    Comment: Performed at [facility] [address] [place], [place] [address]  Brain natriuretic peptide     Status: Abnormal   Collection Time: 09/22/18 10:45 AM  Result Value Ref Range   B Natriuretic Peptide 142.5 (H) 0.0 - 100.0 pg/mL    Comment: Performed at [facility] [address], [place], [place] [address]  Acetaminophen level     Status: Abnormal   Collection Time: 09/22/18  1:39 PM  Result Value Ref Range   Acetaminophen (Tylenol), Serum <10 (L) 10 - 30 ug/mL    Comment: (NOTE) Therapeutic concentrations vary significantly. A range of 10-30 ug/mL  may be an effective concentration for many patients. However, some  are best treated at concentrations outside of this range. Acetaminophen concentrations >150 ug/mL at 4 hours after ingestion  and >50 ug/mL at 12 hours  after ingestion are often associated with  toxic reactions. Performed at [facility], [place] [address] [place], [place] [address]   Troponin I - ONCE - STAT     Status: None   Collection Time: 09/22/18  1:39 PM  Result Value Ref Range   Troponin I <0.03 <0.03 ng/mL    Comment: Performed at [facility], [place] [address] [place],  [address]  Urinalysis, Routine w reflex microscopic     Status: Abnormal   Collection Time: 09/22/18  2:42 PM  Result Value Ref Range   Color, Urine AMBER (A) YELLOW    Comment: BIOCHEMICALS MAY BE AFFECTED BY COLOR   APPearance CLEAR CLEAR   Specific Gravity, Urine 1.017 1.005 - 1.030   pH 7.0 5.0 - 8.0   Glucose, UA NEGATIVE NEGATIVE mg/dL   Hgb urine dipstick NEGATIVE NEGATIVE   Bilirubin Urine SMALL (A) NEGATIVE   Ketones, ur NEGATIVE NEGATIVE mg/dL   Protein, ur NEGATIVE NEGATIVE mg/dL   Nitrite NEGATIVE NEGATIVE   Leukocytes,Ua NEGATIVE NEGATIVE    Comment: Performed at [facility] [address], [place],  [address]   Dg Chest Port 1 View  Result Date: 09/22/2018 CLINICAL DATA:  Acute onset of mid chest pain that began last night. Current history of CHF and hypertension. Prior [place]. EXAM: PORTABLE CHEST 1 VIEW COMPARISON:  CTA chest 06/04/2018. Chest x-rays 06/04/2018 and earlier. FINDINGS: Cardiac silhouette normal in size, unchanged. Prior CABG. Thoracic aorta mildly atherosclerotic and tortuous. Hilar and mediastinal contours otherwise unremarkable. Mildly prominent bronchovascular markings diffusely and mild central peribronchial thickening, unchanged. Lungs otherwise clear. No localized airspace consolidation. No pleural effusions. No pneumothorax. Normal pulmonary vascularity. IMPRESSION: Stable mild changes of chronic  bronchitis and/or asthma. No acute cardiopulmonary disease. Electronically Signed   By: Evangeline Dakin M.D.   On: 09/22/2018 11:25   US Abdomen Limited Ruq  Result Date: 09/22/2018 CLINICAL  DATA:  Elevated liver function test. EXAM: ULTRASOUND ABDOMEN LIMITED RIGHT UPPER QUADRANT COMPARISON:  None. FINDINGS: Gallbladder: Mixed echogenicity material within the gallbladder, presumably sludge. Hyperechoic masslike focus within the gallbladder measures 1.3 x 1.2 cm, most likely tumefactive sludge. No gallbladder wall thickening or pericholecystic fluid. No sonographic Murphy's sign elicited during the exam. Common bile duct: Diameter: 5 mm Liver: Liver echotexture is coarse and there is nodularity of the peripheral liver margins suggesting cirrhosis. No focal mass or lesion within the liver. Portal vein is patent on color Doppler imaging with normal direction of blood flow towards the liver. Incidental note of a RIGHT renal cyst, measuring 3.6 cm, corresponding to a benign cyst demonstrated on CT dated 02/07/2018. IMPRESSION: 1. No evidence of cholecystitis. No discrete gallstones identified. Hyperechoic masslike structure within the gallbladder measures 1.3 cm, most likely tumefactive sludge. Additional layering sludge within the gallbladder. 2. No bile duct dilatation. 3. Probable liver cirrhosis. Electronically Signed   By: Franki Cabot M.D.   On: 09/22/2018 13:22    Pending Labs Unresulted Labs (From admission, onward)    Start     Ordered   09/29/18 0500  Creatinine, serum  (enoxaparin (LOVENOX)    CrCl >/= 30 ml/min)  Weekly,   R    Comments:  while on enoxaparin therapy    09/22/18 1543   09/23/18 0500  Comprehensive metabolic panel  Tomorrow morning,   R     09/22/18 1543   09/23/18 0500  Protime-INR  Tomorrow morning,   R     09/22/18 1543   09/23/18 0500  CBC  Tomorrow morning,   R     09/22/18 1543   09/22/18 1542  CBC  (enoxaparin (LOVENOX)    CrCl >/= 30 ml/min)  Once,   R    Comments:  Baseline for enoxaparin therapy IF NOT ALREADY DRAWN.  Notify MD if PLT < 100 K.    09/22/18 1543   09/22/18 1542  Creatinine, serum  (enoxaparin (LOVENOX)    CrCl >/= 30 ml/min)  Once,   R     Comments:  Baseline for enoxaparin therapy IF NOT ALREADY DRAWN.    09/22/18 1543   09/22/18 1509  Blood culture (routine x 2)  BLOOD CULTURE X 2,   STAT     09/22/18 1508   09/22/18 1447  Lactic acid, plasma  Now then every 2 hours,   STAT     09/22/18 1446          Vitals/Pain Today's Vitals   09/22/18 1500 09/22/18 1515 09/22/18 1530 09/22/18 1545  BP: (!) 152/65 135/67 133/62 (!) 119/59  Pulse: 78 77 78 77  Resp: (!) 30 (!) 29 (!) 29 (!) 26  Temp:      TempSrc:      SpO2: 96% 93% 91% 93%  Weight:      Height:        Isolation Precautions No active isolations  Medications Medications  piperacillin-tazobactam (ZOSYN) IVPB 3.375 g (3.375 g Intravenous New Bag/Given 09/22/18 1605)  methotrexate (RHEUMATREX) tablet 22.5 mg (has no administration in time range)  furosemide (LASIX) tablet 20 mg (has no administration in time range)  losartan (COZAAR) tablet 25 mg (has no administration in time range)  escitalopram (LEXAPRO) tablet 20 mg (has no administration in  time range)  dutasteride (AVODART) capsule 0.5 mg (has no administration in time range)  folic acid (FOLVITE) tablet 1 mg (has no administration in time range)  enoxaparin (LOVENOX) injection 40 mg (has no administration in time range)  oxyCODONE (Oxy IR/ROXICODONE) immediate release tablet 5 mg (has no administration in time range)  ondansetron (ZOFRAN) tablet 4 mg (has no administration in time range)    Or  ondansetron (ZOFRAN) injection 4 mg (has no administration in time range)  ondansetron (ZOFRAN) injection 4 mg (4 mg Intravenous Given 09/22/18 1419)  ketorolac (TORADOL) 30 MG/ML injection 15 mg (15 mg Intravenous Given 09/22/18 1506)    Mobility walks Moderate fall risk   Focused Assessments Pulmonary Assessment Handoff:  Lung sounds:   O2 Device: Room Air        R Recommendations: See Admitting Provider Note  Report given to:   Additional Notes: Speaks [place]. Grand-daughter-an  ED-doctor is the main contact and has been helping with telling PT what needs to be done

## 2018-09-22 NOTE — Progress Notes (Signed)
The patient was transferred from 5w20 to 6e21 via bed.  The patient's daughter, [name], was called and informed of the transfer.  The transfer was explained to the patient through interpreter, patient indicated understanding.

## 2018-09-22 NOTE — Progress Notes (Signed)
  Pharmacy Antibiotic Note  [name] is a 85 y.o. male admitted on 09/22/2018 with intra-abd infection.  Pharmacy has been consulted for Zosyn dosing. Noted pt on methotrexate PTA - currently holding since LFTs elevated.  Pt received Zosyn 3.375gm in ED ~1600.  Plan: Zosyn 3.375gm IV q8h Will f/u micro data, pt's clinical condition, and renal function  Height: 5' 2.99" (160 cm) Weight: 173 lb (78.5 kg) IBW/kg (Calculated) : 56.88  Temp (24hrs), Avg:99.9 F (37.7 C), Min:98.6 F (37 C), Max:101.1 F (38.4 C)  Recent Labs  Lab 09/22/18 1044 09/22/18 1502  WBC 7.3  --   CREATININE 1.04  --   LATICACIDVEN  --  1.6    Estimated Creatinine Clearance: 45.5 mL/min (by C-G formula based on SCr of 1.04 mg/dL).    Allergies  Allergen Reactions  . Ativan [Lorazepam] Other (See Comments)    hallucinations  . Phenergan [Promethazine Hcl] Hypertension  . Eggs Or Egg-Derived Products Rash    Small rash after eating for several days in a row   Antimicrobials this admission: 4/11 Zosyn >>   Microbiology results: 4/11 BCx:  4/11 Covid:   Thank you for allowing pharmacy to be a part of this patient's care.  Sherlon Handing, PharmD, BCPS Clinical pharmacist  **Pharmacist phone directory can now be found on [email or web address] ([email or web address]).  Listed under [place]. 09/22/2018 6:06 PM

## 2018-09-22 NOTE — ED Notes (Signed)
Spoke to pt's daughter, [name]. Updated her, informed her pt is resting comfortably and is stable. ED provider spoke with pt's granddaughter, Clearnce Sorrel updating her on grandfather's status.   [name] [phone]

## 2018-09-22 NOTE — Progress Notes (Signed)
Utilized [employer] to communicate with patient during assessment and med administration.  Patient reported understanding through interpreter and denies any needs at this time.  Interpreter ID's [phone] and [phone].

## 2018-09-22 NOTE — ED Notes (Signed)
Attempted report 

## 2018-09-22 NOTE — ED Notes (Signed)
Family called to notify of transfer to [place]

## 2018-09-22 NOTE — ED Provider Notes (Signed)
[facility] EMERGENCY DEPARTMENT Provider Note   CSN: [id number] Arrival date & time: 09/22/18  1006    History   Chief Complaint Chief Complaint  Patient presents with   Chest Pain    HPI [name] is a 85 y.o. male with history of BPH, GERD, HLD, HTN, severe aortic stenosis status post TAVR, hard of hearing (does not wear hearing aid) presenting for evaluation of cute onset, progressively worsening shortness of breath since yesterday.  Patient is primarily [place] and history and physical examination are limited as he is quite hard of hearing.  EMS reports that he was also complaining of some nausea and vomiting though he denies this to me.  Reports he feels short of breath.  No fevers or cough.  No known sick contacts, however he is requesting testing for COVID-19.  He reports burning chest pain this morning. He received 324 mg of aspirin and 1 tablet of nitroglycerin sublingually with improvement in his chest pain.  Portions of the history were obtained by speaking with the patient's granddaughter Dr. Loma Messing with his permission. She can be reached at [phone].      The history is provided by the patient and the EMS personnel. The history is limited by a language barrier and the condition of the patient (Very hard of hearing). A language interpreter was used.    Past Medical History:  Diagnosis Date   BPH (benign prostatic hypertrophy)    Depression    Diverticulosis of colon    GERD (gastroesophageal reflux disease)    Gilbert's syndrome    History of kidney stones    HLD (hyperlipidemia)    HOH (hard of hearing)    refuses to wear his Hearing Aid   Hypertension    Inguinal hernia    right   Internal hemorrhoid    Normal coronary arteries    a. Cath 07/2010: normal coronaries. Felt to have noncardiac CP/SOB at that time possibly r/t anxiety.   RA (rheumatoid arthritis) (HCC)    bilateral hands/ wrist--   seronegative   S/P TAVR (transcatheter aortic valve replacement) 03/27/2018   23 mm Edwards Sapien 3 transcatheter heart valve placed via percutaneous right transfemoral approach    Severe aortic stenosis    Thyroid nodule    noted 11-2009   Wears dentures     Patient Active Problem List   Diagnosis Date Noted   Elevated LFTs 09/22/2018   Inguinal hernia of right side without obstruction or gangrene 08/22/2018   Chronic diastolic CHF (congestive heart failure) ([place]) 07/24/2018   Left-sided carotid artery disease ([place]) 07/24/2018   Chest pain 06/05/2018   S/P TAVR (transcatheter aortic valve replacement) 03/27/2018   RA (rheumatoid arthritis) ([place])    HOH (hard of hearing)    GERD (gastroesophageal reflux disease)    Benign prostatic hyperplasia    HLD (hyperlipidemia)    Depression    Diverticulosis 02/07/2018   Severe aortic stenosis    Benign essential HTN    Musculoskeletal chest pain 08/20/2016   Neoplasm of uncertain behavior of thyroid gland, 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   Sinus bradycardia 06/20/2014    Past Surgical History:  Procedure Laterality Date   CARDIAC CATHETERIZATION  07-19-2010  dr Tressia Miners turner   normal coronary arteries,  ef 60%,  moderate aortic stenosis- gradient 54mmHg, normal right heart pressure   CARDIOVASCULAR STRESS TEST  05/ 2011   dr Marlou Porch   normal low risk perfusion study   CATARACT EXTRACTION W/  INTRAOCULAR LENS  IMPLANT, BILATERAL  2013   CATARACT EXTRACTION, BILATERAL     COLONOSCOPY  2010  approx   INGUINAL HERNIA REPAIR Right 08/23/2018   Procedure: OPEN RIGHT INGUINAL HERNIA REPAIR WITH MESH;  Surgeon: Armandina Gemma, MD;  Location: [facility];  Service: General;  Laterality: Right;   INTRAOPERATIVE TRANSTHORACIC ECHOCARDIOGRAM N/A 03/27/2018   Procedure: INTRAOPERATIVE TRANSTHORACIC ECHOCARDIOGRAM;  Surgeon: Burnell Blanks, MD;  Location: [facility];  Service: Open Heart Surgery;  Laterality: N/A;   RIGHT/LEFT  HEART CATH AND CORONARY ANGIOGRAPHY N/A 02/06/2018   Procedure: RIGHT/LEFT HEART CATH AND CORONARY ANGIOGRAPHY;  Surgeon: Burnell Blanks, MD;  Location: [facility];  Service: Cardiovascular;  Laterality: N/A;   THYROID LOBECTOMY Right 05/05/2015   Procedure: RIGHT THYROID LOBECTOMY;  Surgeon: Armandina Gemma, MD;  Location: [facility];  Service: General;  Laterality: Right;   TRANSANAL HEMORRHOIDAL DEARTERIALIZATION N/A 04/09/2014   Procedure: TRANSANAL HEMORRHOIDAL DEARTERIALIZATION OF INTERNAL HEMORROIDS;  Surgeon: Leighton Ruff, MD;  Location: [facility];  Service: General;  Laterality: N/A;   TRANSCATHETER AORTIC VALVE REPLACEMENT, TRANSFEMORAL  03/27/2018   TRANSCATHETER AORTIC VALVE REPLACEMENT, TRANSFEMORAL N/A 03/27/2018   Procedure: TRANSCATHETER AORTIC VALVE REPLACEMENT, TRANSFEMORAL. Edwards SAPIEN 3 Transcatheter Heart Valve 12mm.;  Surgeon: Burnell Blanks, MD;  Location: [facility];  Service: Open Heart Surgery;  Laterality: N/A;   TRANSTHORACIC ECHOCARDIOGRAM  11-26-2013   moderate focal basal and mild LVH/  ef 46-96%/  grade I diastolic dysfunction/ mild LAE/  moderate calcification with stenosis AV with mild regurg,  gradients 35 abd 58 mmHg /  mild TR        Home Medications    Prior to Admission medications   Medication Sig Start Date End Date Taking? Authorizing Provider  Ascorbic Acid (VITAMIN C) 1000 MG tablet Take 1,000 mg by mouth daily after lunch.    [provider]  aspirin 81 MG tablet Take 1 tablet (81 mg total) by mouth daily. Patient taking differently: Take 81 mg by mouth daily.  06/05/18   Barrett, Evelene Croon, PA-C  cholecalciferol (VITAMIN D3) 25 MCG (1000 UT) tablet Take 1,000 Units by mouth daily.    [provider]  clopidogrel (PLAVIX) 75 MG tablet Take 1 tablet (75 mg total) by mouth daily with breakfast. Patient taking differently: Take 75 mg by mouth daily with breakfast.  03/29/18   Eileen Stanford,  PA-C  dutasteride (AVODART) 0.5 MG capsule Take 0.5 mg by mouth daily.    [provider]  escitalopram (LEXAPRO) 20 MG tablet Take 1 tablet (20 mg total) by mouth daily. Patient taking differently: Take 20 mg by mouth at bedtime.  09/06/16   Angiulli, Lavon Paganini, PA-C  folic acid (FOLVITE) 1 MG tablet Take 1 mg by mouth every evening.  01/27/15   [provider]  furosemide (LASIX) 20 MG tablet Take 20 mg by mouth.    [provider]  ibuprofen (ADVIL,MOTRIN) 800 MG tablet Take 800 mg by mouth every morning.    [provider]  losartan (COZAAR) 25 MG tablet Take 25 mg by mouth daily.  01/21/18   [provider]  Melatonin 5 MG TABS Take 5 mg by mouth at bedtime as needed (for sleep).     [provider]  methotrexate (RHEUMATREX) 2.5 MG tablet Take 22.5 mg by mouth every Sunday.  07/13/13   [provider]  Omega-3 Fatty Acids (FISH OIL) 1000 MG CAPS Take 1,000 mg by mouth daily.  [provider]  simvastatin (ZOCOR) 20 MG tablet TAKE 1 TABLET BY MOUTH EVERY NIGHT AT BEDTIME Patient taking differently: Take 20 mg by mouth every evening.  05/06/14   Jerline Pain, MD  traMADol (ULTRAM) 50 MG tablet Take 1-2 tablets (50-100 mg total) by mouth every 6 (six) hours as needed. 08/23/18   Armandina Gemma, MD  vitamin E 400 UNIT capsule Take 400 Units by mouth daily after lunch.    [provider]    Family History Family History  Problem Relation Age of Onset   Pulmonary embolism Mother 42       caused by complications of surgery   CVA Father    Diabetes Brother    Diabetes Brother    Breast cancer Sister     Social History Social History   Tobacco Use   Smoking status: Former Smoker    Years: 40.00    Types: Cigarettes    Start date: 06/13/1973    Last attempt to quit: 04/03/1981    Years since quitting: 37.4   Smokeless tobacco: Never Used  Substance Use Topics   Alcohol use: Yes    Comment:  occasional   Drug use: No     Allergies   Ativan [lorazepam]; Phenergan [promethazine hcl]; and Eggs or egg-derived products   Review of Systems Review of Systems  Constitutional: Negative for chills and fever.  Respiratory: Positive for shortness of breath. Negative for cough.   Cardiovascular: Positive for chest pain.  Gastrointestinal: Positive for nausea and vomiting. Negative for abdominal pain.  All other systems reviewed and are negative.    Physical Exam Updated Vital Signs BP (!) 119/59    Pulse 77    Temp (!) 101.1 F (38.4 C) Comment: EDP Notified   Resp (!) 26    Ht 5' 2.99" (1.6 m)    Wt 78.5 kg    SpO2 93%    BMI 30.65 kg/m   Physical Exam Vitals signs and nursing note reviewed.  Constitutional:      General: He is not in acute distress.    Appearance: He is well-developed.  HENT:     Head: Normocephalic and atraumatic.  Eyes:     General:        Right eye: No discharge.        Left eye: No discharge.     Conjunctiva/sclera: Conjunctivae normal.  Neck:     Vascular: No JVD.     Trachea: No tracheal deviation.  Cardiovascular:     Rate and Rhythm: Normal rate and regular rhythm.  Pulmonary:     Effort: Pulmonary effort is normal. Tachypnea present.     Breath sounds: Examination of the right-lower field reveals rales. Examination of the left-lower field reveals rales. Rales present.  Abdominal:     General: Bowel sounds are normal. There is no distension.     Palpations: Abdomen is soft. There is no mass.     Tenderness: There is abdominal tenderness in the right upper quadrant, epigastric area and left upper quadrant. There is no guarding or rebound.  Musculoskeletal:     Right lower leg: He exhibits no tenderness. No edema.     Left lower leg: He exhibits no tenderness. No edema.  Skin:    General: Skin is warm and dry.     Findings: No erythema.  Neurological:     Mental Status: He is alert.  Psychiatric:        Behavior: Behavior normal.  ED Treatments / Results  Labs (all labs ordered are listed, but only abnormal results are displayed) Labs Reviewed  BRAIN NATRIURETIC PEPTIDE - Abnormal; Notable for the following components:      Result Value   B Natriuretic Peptide 142.5 (*)    All other components within normal limits  URINALYSIS, ROUTINE W REFLEX MICROSCOPIC - Abnormal; Notable for the following components:   Color, Urine AMBER (*)    Bilirubin Urine SMALL (*)    All other components within normal limits  COMPREHENSIVE METABOLIC PANEL - Abnormal; Notable for the following components:   Glucose, Bld 157 (*)    AST 542 (*)    ALT 496 (*)    Alkaline Phosphatase 152 (*)    Total Bilirubin 4.3 (*)    All other components within normal limits  ACETAMINOPHEN LEVEL - Abnormal; Notable for the following components:   Acetaminophen (Tylenol), Serum <10 (*)    All other components within normal limits  CULTURE, BLOOD (ROUTINE X 2)  CULTURE, BLOOD (ROUTINE X 2)  CBC WITH DIFFERENTIAL/PLATELET  LIPASE, BLOOD  TROPONIN I  TROPONIN I  LACTIC ACID, PLASMA  LACTIC ACID, PLASMA  CBC  CREATININE, SERUM    EKG EKG Interpretation  Date/Time:  Saturday September 22 2018 10:11:57 EDT Ventricular Rate:  75 PR Interval:    QRS Duration: 87 QT Interval:  366 QTC Calculation: 409 R Axis:   -7 Text Interpretation:  Sinus rhythm Confirmed by Lennice Sites [phone]) on 09/22/2018 10:19:34 AM   Radiology Dg Chest Port 1 View  Result Date: 09/22/2018 CLINICAL DATA:  Acute onset of mid chest pain that began last night. Current history of CHF and hypertension. Prior [place]. EXAM: PORTABLE CHEST 1 VIEW COMPARISON:  CTA chest 06/04/2018. Chest x-rays 06/04/2018 and earlier. FINDINGS: Cardiac silhouette normal in size, unchanged. Prior CABG. Thoracic aorta mildly atherosclerotic and tortuous. Hilar and mediastinal contours otherwise unremarkable. Mildly prominent bronchovascular markings diffusely and mild central peribronchial  thickening, unchanged. Lungs otherwise clear. No localized airspace consolidation. No pleural effusions. No pneumothorax. Normal pulmonary vascularity. IMPRESSION: Stable mild changes of chronic bronchitis and/or asthma. No acute cardiopulmonary disease. Electronically Signed   By: Evangeline Dakin M.D.   On: 09/22/2018 11:25   US Abdomen Limited Ruq  Result Date: 09/22/2018 CLINICAL DATA:  Elevated liver function test. EXAM: ULTRASOUND ABDOMEN LIMITED RIGHT UPPER QUADRANT COMPARISON:  None. FINDINGS: Gallbladder: Mixed echogenicity material within the gallbladder, presumably sludge. Hyperechoic masslike focus within the gallbladder measures 1.3 x 1.2 cm, most likely tumefactive sludge. No gallbladder wall thickening or pericholecystic fluid. No sonographic Murphy's sign elicited during the exam. Common bile duct: Diameter: 5 mm Liver: Liver echotexture is coarse and there is nodularity of the peripheral liver margins suggesting cirrhosis. No focal mass or lesion within the liver. Portal vein is patent on color Doppler imaging with normal direction of blood flow towards the liver. Incidental note of a RIGHT renal cyst, measuring 3.6 cm, corresponding to a benign cyst demonstrated on CT dated 02/07/2018. IMPRESSION: 1. No evidence of cholecystitis. No discrete gallstones identified. Hyperechoic masslike structure within the gallbladder measures 1.3 cm, most likely tumefactive sludge. Additional layering sludge within the gallbladder. 2. No bile duct dilatation. 3. Probable liver cirrhosis. Electronically Signed   By: Franki Cabot M.D.   On: 09/22/2018 13:22    Procedures Procedures (including critical care time)  Medications Ordered in ED Medications  piperacillin-tazobactam (ZOSYN) IVPB 3.375 g (has no administration in time range)  methotrexate (RHEUMATREX) tablet 22.5 mg (has  no administration in time range)  furosemide (LASIX) tablet 20 mg (has no administration in time range)  losartan (COZAAR)  tablet 25 mg (has no administration in time range)  escitalopram (LEXAPRO) tablet 20 mg (has no administration in time range)  dutasteride (AVODART) capsule 0.5 mg (has no administration in time range)  folic acid (FOLVITE) tablet 1 mg (has no administration in time range)  enoxaparin (LOVENOX) injection 40 mg (has no administration in time range)  oxyCODONE (Oxy IR/ROXICODONE) immediate release tablet 5 mg (has no administration in time range)  ondansetron (ZOFRAN) tablet 4 mg (has no administration in time range)    Or  ondansetron (ZOFRAN) injection 4 mg (has no administration in time range)  ondansetron (ZOFRAN) injection 4 mg (4 mg Intravenous Given 09/22/18 1419)  ketorolac (TORADOL) 30 MG/ML injection 15 mg (15 mg Intravenous Given 09/22/18 1506)     Initial Impression / Assessment and Plan / ED Course  I have reviewed the triage vital signs and the nursing notes.  Pertinent labs & imaging results that were available during my care of the patient were reviewed by me and considered in my medical decision making (see chart for details).        Patient presenting for evaluation of nausea vomiting, upper abdominal pain, shortness of breath mostly beginning last night.  Initially afebrile, mildly tachypneic, otherwise vital signs stable.  History and physical examination somewhat limited due to language barrier and being hearing impaired.  Nontoxic in appearance.  He has some upper abdominal tenderness on examination.  Chest x-ray reviewed by me shows stable mild changes consistent with chronic bronchitis but no evidence of pneumonia or pleural effusion.  EKG shows normal sinus rhythm, initial troponin negative.  Lab work reviewed by me shows no leukocytosis, no anemia, no renal insufficiency or electrolyte abnormalities.  His LFTs are markedly elevated.  Chart review shows that they have been similarly elevated in August of last year.  It appears that he was admitted for shortness of breath  and chest pain and was found to have severe aortic stenosis which prompted cardiology/CT surgery evaluation with recommendation for TAVR which he underwent in October 2019.  With regards to his LFTs at that time, it appears the patient was asymptomatic and his examination was benign.  General surgery was consulted during the admission and it was deemed that there was no role for surgical intervention.  His ultrasound of the abdomen showed what was thought to be sludge ball within the gallbladder discussed with radiology who did not feel MRCP was indicated at the time.  They did recommend repeat ultrasound within about 6 months which was not completed.  Spoke with the patient's granddaughter Dr. Vladimir Faster on the phone with the patient's permission who provided some collateral information.  She reports that the patient has been known to sometimes take more of his home medications than prescribed including Tylenol.  We will obtain an acetaminophen level to rule out toxicity.  Right upper quadrant ultrasound today shows no evidence of acute cholecystitis but does show a 1.3 cm hyperechoic masslike structure within the gallbladder; radiology reports this is most consistent with tumefactive sludge with some additional layering sludge within the gallbladder.  No bile duct dilatation.  There is some evidence of liver cirrhosis.  Spoke with Dr. Therisa Doyne with [facility] gastro[facility]ology who recommends emergent MR/MRCP with hospitalist admission.   On reevaluation, patient continues to rest comfortably.  Repeat vitals show that patient has developed a fever with oral temp 101.1  F.  We will add lactate and blood cultures.  He does not appear to be septic at this time however.  No peritoneal signs on examination of the abdomen.  With elevated LFTs and fever, concern for ascending cholangitis.  Will give a dose of Zosyn in the ED.  Given patient is presenting in the midst of the coronavirus epidemic, we considered COVID-19 as a  possible diagnosis.  He appears low risk with no known sick contacts, no recent travel, no complaint of cough, and clear chest x-ray.  Spoke with Dr. Patrecia Pour with [facility] who agrees to assume care of patient and bring him into the hospital for further evaluation and management.  Patient seen and evaluated by Dr. Ronnald Nian who agrees with assessment and plan at this time.  Telvin Reinders was evaluated in Emergency Department on 09/22/2018 for the symptoms described in the history of present illness. He was evaluated in the context of the global COVID-19 pandemic, which necessitated consideration that the patient might be at risk for infection with the SARS-CoV-2 virus that causes COVID-19. Institutional protocols and algorithms that pertain to the evaluation of patients at risk for COVID-19 are in a state of rapid change based on information released by regulatory bodies including the CDC and federal and state organizations. These policies and algorithms were followed during the patient's care in the ED.  Final Clinical Impressions(s) / ED Diagnoses   Final diagnoses:  LFT elevation  Abnormal gallbladder ultrasound    ED Discharge Orders    None       Renita Papa, PA-C 09/22/18 1616    Lennice Sites, DO 09/22/18 1624

## 2018-09-22 NOTE — ED Notes (Signed)
Phlebotomist at bedside.

## 2018-09-23 LAB — RESPIRATORY PANEL BY PCR

## 2018-09-23 LAB — COMPREHENSIVE METABOLIC PANEL
ALT: 334 U/L — ABNORMAL HIGH (ref 0–44)
AST: 226 U/L — ABNORMAL HIGH (ref 15–41)
Albumin: 3.4 g/dL — ABNORMAL LOW (ref 3.5–5.0)
Alkaline Phosphatase: 179 U/L — ABNORMAL HIGH (ref 38–126)
Anion gap: 11 (ref 5–15)
BUN: 17 mg/dL (ref 8–23)
CO2: 25 mmol/L (ref 22–32)
Calcium: 8.5 mg/dL — ABNORMAL LOW (ref 8.9–10.3)
Chloride: 102 mmol/L (ref 98–111)
Creatinine, Ser: 1.46 mg/dL — ABNORMAL HIGH (ref 0.61–1.24)
GFR calc Af Amer: 49 mL/min — ABNORMAL LOW (ref >60)
GFR calc non Af Amer: 42 mL/min — ABNORMAL LOW (ref >60)
Glucose, Bld: 133 mg/dL — ABNORMAL HIGH (ref 70–99)
Potassium: 3.9 mmol/L (ref 3.5–5.1)
Sodium: 138 mmol/L (ref 135–145)
Total Bilirubin: 7.3 mg/dL — ABNORMAL HIGH (ref 0.3–1.2)
Total Protein: 6.5 g/dL (ref 6.5–8.1)

## 2018-09-23 LAB — CBC
HCT: 38.5 % — ABNORMAL LOW (ref 39.0–52.0)
Hemoglobin: 12.2 g/dL — ABNORMAL LOW (ref 13.0–17.0)
MCH: 28.7 pg (ref 26.0–34.0)
MCHC: 31.7 g/dL (ref 30.0–36.0)
MCV: 90.6 fL (ref 80.0–100.0)
Platelets: 100 10*3/uL — ABNORMAL LOW (ref 150–400)
RBC: 4.25 MIL/uL (ref 4.22–5.81)
RDW: 13.1 % (ref 11.5–15.5)
WBC: 6.8 10*3/uL (ref 4.0–10.5)
nRBC: 0 % (ref 0.0–0.2)

## 2018-09-23 LAB — PROTIME-INR
INR: 1.3 — ABNORMAL HIGH (ref 0.8–1.2)
Prothrombin Time: 15.8 seconds — ABNORMAL HIGH (ref 11.4–15.2)

## 2018-09-23 MED ORDER — SODIUM CHLORIDE 0.9 % IV SOLN
INTRAVENOUS | Status: AC
  Administered 2018-09-23 – 2018-09-24 (×2): via INTRAVENOUS

## 2018-09-23 MED ORDER — SODIUM CHLORIDE 0.9 % IV SOLN: INTRAVENOUS | Status: DC

## 2018-09-23 MED ORDER — MORPHINE SULFATE (PF) 2 MG/ML IV SOLN
1.0000 mg | INTRAVENOUS | Status: DC | PRN
  Administered 2018-09-23: 22:00:00 2 mg via INTRAVENOUS
  Filled 2018-09-23: qty 1

## 2018-09-23 NOTE — Progress Notes (Signed)
[facility] Progress Note  Patient: [name] UMP:[id number]   PCP: Lajean Manes, MD DOB: February 09, 1928   DOA: 09/22/2018   DOS: 09/23/2018   Date of Service: the patient was seen and examined on 09/23/2018  Brief hospital course: Pt. with PMH of severe aortic stenosis status post TAVR, hypertension, BPH, diastolic CHF,GERD rheumatoid arthritis on methotrexate; admitted on 09/22/2018, presented with complaint of shortnes of breath, was found to have CBD stone with biliary obstruction. Currently further plan is add IV Antibiotics and await the result of MRCP.  Subjective: Interpreter used.  Patient denies any nausea vomiting abdominal pain diarrhea.  No fever no chills.  Assessment and Plan: 1.  Biliary obstruction with biliary colic. Presents with abdominal pain. Elevated LFT.  CBD was dilated. CT scan showed gallstone. There is a concern of cholangitis and therefore patient was started on IV antibiotics. Unable to perform MRCP as of right now but MRI ordered. We will continue with IV antibiotics. Appreciate GI consultation for now. May require ERCP if the condition worsens.  2. Rule out COVID-19 Presented with [place], fever no Travel no Sick contacts No leukopenia, mild lymphopenia increased CRP,  CXR: mild changes of chronic bronchitis   Plan: Supportive measures  Date: 09/23/2018 2:25 PM  Patient Isolation: Droplet+Contact HCP PPE: Wearing all recommended PPE PAPR + eye protection + Gown + Gloves + Surgical Cap Patient PPE: None   3.  Rheumatoid arthritis. On methotrexate currently on hold.  4.  Essential hypertension. CAD Chronic diastolic CHF S/P TAVR Patient is on Lasix, losartan, aspirin and Plavix. We will continueTo hold this medication for now.  5. Body mass index is 30.43 kg/m.  Obesity Monitor   Diet: heart healthy DVT Prophylaxis: subcutaneous Heparin  Advance goals of care discussion: DNR DNI  Family Communication: nofamily was  present at bedside, at the time of interview.   Disposition:  Discharge to be determined .  Consultants: gastroenterology  Procedures: none  Scheduled Meds: . dutasteride  0.5 mg Oral Daily  . enoxaparin (LOVENOX) injection  40 mg Subcutaneous Q24H  . escitalopram  20 mg Oral Daily  . folic acid  1 mg Oral QPM   Continuous Infusions: . sodium chloride 10 mL/hr at 09/22/18 2300  . piperacillin-tazobactam (ZOSYN)  IV 3.375 g (09/23/18 1313)   PRN Meds: sodium chloride, ondansetron **OR** ondansetron (ZOFRAN) IV, oxyCODONE Antibiotics: Anti-infectives (From admission, onward)   Start     Dose/Rate Route Frequency Ordered Stop   09/22/18 2200  piperacillin-tazobactam (ZOSYN) IVPB 3.375 g     3.375 g 12.5 mL/hr over 240 Minutes Intravenous Every 8 hours 09/22/18 1809     09/22/18 1515  piperacillin-tazobactam (ZOSYN) IVPB 3.375 g     3.375 g 100 mL/hr over 30 Minutes Intravenous  Once 09/22/18 1508 09/22/18 1635       Objective: Physical Exam: Vitals:   09/22/18 2109 09/22/18 2317 09/23/18 0420 09/23/18 0836  BP: (!) 144/52 (!) 140/58 (!) 139/52 (!) 115/58  Pulse: 74 71 61   Resp: 18 15 15    Temp: 99.2 F (37.3 C) 100.2 F (37.9 C) 97.7 F (36.5 C)   TempSrc: Oral Oral Oral   SpO2: 92% 93% 95%   Weight:   77.9 kg   Height:        Intake/Output Summary (Last 24 hours) at 09/23/2018 1421 Last data filed at 09/23/2018 0900 Gross per 24 hour  Intake 280 ml  Output -  Net 280 ml   [employer]  09/22/18 1035 09/23/18 0420  Weight: 78.5 kg 77.9 kg   General: Alert, Awake and Oriented to Time, Place and Person. Appear in mild distress, affect appropriate Eyes: PERRL, Conjunctiva normal ENT: Oral Mucosa clear moist. Neck: no JVD, no Abnormal Mass Or lumps Cardiovascular: S1 and S2 Present, no Murmur, Peripheral Pulses Present Respiratory: normal respiratory effort, Bilateral Air entry equal and Decreased, no use of accessory muscle, Clear to Auscultation, no  Crackles, no wheezes Abdomen: Bowel Sound present, Soft and no tenderness, no hernia Skin: no redness, no Rash, no induration Extremities: no Pedal edema, no calf tenderness Neurologic: Grossly no focal neuro deficit. Bilaterally Equal motor strength  Data Reviewed: CBC: Recent Labs  Lab 09/22/18 1044 09/23/18 0537  WBC 7.3 6.8  NEUTROABS 5.7  --   HGB 13.0 12.2*  HCT 40.0 38.5*  MCV 92.0 90.6  PLT PLATELET CLUMPS NOTED ON SMEAR, COUNT APPEARS DECREASED 330*   Basic Metabolic Panel: Recent Labs  Lab 09/22/18 1044 09/23/18 0537  NA 137 138  K 3.9 3.9  CL 102 102  CO2 23 25  GLUCOSE 157* 133*  BUN 11 17  CREATININE 1.04 1.46*  CALCIUM 9.0 8.5*    Liver Function Tests: Recent Labs  Lab 09/22/18 1044 09/23/18 0537  AST 542* 226*  ALT 496* 334*  ALKPHOS 152* 179*  BILITOT 4.3* 7.3*  PROT 7.0 6.5  ALBUMIN 3.9 3.4*   Recent Labs  Lab 09/22/18 1044  LIPASE 41   No results for input(s): AMMONIA in the last 168 hours. Coagulation Profile: Recent Labs  Lab 09/23/18 0537  INR 1.3*   Cardiac Enzymes: Recent Labs  Lab 09/22/18 1044 09/22/18 1339  TROPONINI <0.03 <0.03   BNP (last 3 results) No results for input(s): PROBNP in the last 8760 hours. CBG: No results for input(s): GLUCAP in the last 168 hours. Studies: No results found.   Time spent: 35 minutes  Author: Berle Mull, MD [facility] 09/23/2018 2:21 PM  To reach On-call, see care teams to locate the attending and reach out to them via [email or web address]. If 7PM-7AM, please contact night-coverage If you still have difficulty reaching the attending provider, please page the [facility] (Director on Call) for [facility] on amion for assistance.

## 2018-09-23 NOTE — Plan of Care (Signed)
  Problem: Clinical Measurements: Goal: Ability to maintain clinical measurements within normal limits will improve Outcome: Progressing   Problem: Nutrition: Goal: Adequate nutrition will be maintained Outcome: Not Progressing   Problem: Pain Managment: Goal: General experience of comfort will improve Outcome: Progressing   

## 2018-09-23 NOTE — Progress Notes (Signed)
Paged by bedside RN regarding the pts family expressing concerns about pts plan of care. Spoke with the pts wife and daughter regarding the current plan of care. They raised concerns regarding pts pain control, PO intake, and the pts inability to have MRI and MRCP. Pt has been experiencing abdominal pain after his meals today. Made the pt NPO and added Morphine 1-2mg  IV Q4H PRN. Also added NS @ 40ml/hr for 12hrs since pt has had decreased PO intake. Will monitor for signs of fluid overload.  Also expressed their concern regarding the appropriateness of the pt being tested for COVID-19 and wanting the restrictions lifted before the negative test has resulted. I explained that the restrictions could not be discontinued tonight but that I would relay their concern to the attending. Daughter is an ED physician at [employer] and requested to talk to ID tomorrow regarding the COVID-19 test and having the restrictions lifted before his test has resulted. Wife and daughter expressed no more questions or concerns before ending the call.   Daughter would like to discuss case with attending and ID her name and number are: Dr. Rozetta Nunnery cell [phone]  Arby Barrette AGPCNP-BC, AGNP-C [facility] Pager [phone]

## 2018-09-23 NOTE — Progress Notes (Signed)
Pt alert and oriented x4. Arrived to unit in stable condition. Vitals WDL. Interpreter used to introduce self to patient and orient to unit. Pt able to understand some english. DNR placement band verified, placement is on R wrist. Pt verbalized understanding plan of care. Pt nose swabbed and panel sent to lab. Respiratory test in process. MRI called nurse and stated patient cannot have MRI until lab specimens has resulted.

## 2018-09-23 NOTE — Consult Note (Addendum)
[name] Consult  Referring Provider: Lennice Sites, DO/ER Primary Care Physician:  Ryan Manes, MD Primary Gastroenterologist: [name] Primary  Reason for Consultation: Abnormal liver enzymes  HPI: [name] is a 85 y.o. male was admitted with abnormal liver enzymes and fever.  Patient is currently under precautions as labs have been sent for possible coronavirus infection. As such patient was not seen or examined directly. Most of the history is obtained from documentation, phone conversation with patient's daughter [name], patient's nurse and patient's hospitalist.  As per patient's daughter he is independent of most activities, lives alone and has home health aide for assistance.  Patient called his daughter the day before yesterday with complains of shortness of breath and had episodes of vomiting, was advised by his primary care physician to proceed to the ER.  In the ER he had a temperature of 101.2 F and was found to have abnormal liver enzymes. Ultrasound from 09/22/2018 showed hyperechoic masslike focus within the gallbladder measuring 1.3 x 1.2 cm, most likely tumefactive sludge, normal CBD of 5 mm, nodularity of peripheral liver margin suggesting cirrhosis. In 8/19 when patient had abnormal liver enzymes, ultrasound showed prominent soft tissue density within the gallbladder most consistent with sludge ball, CBD of 5.7 mm.  As per his daughter, he denies acid reflux, heartburn, difficulty swallowing, pain on swallowing. According to his daughter, patient has not been on Plavix since 08/12/2018, and recently underwent an inguinal hernia repair in mid March.  He has been taking aspirin 81 mg daily.  As per his nurse he has not had anything to eat for 2 days, is requesting for a diet and has not had a bowel movement for 2 days.  He denied abdominal pain.  Last colonoscopy was performed in 2004 by Dr. Sallee Provencal, reported as normal except left-sided  diverticulosis.     Past Medical History:  Diagnosis Date  . BPH (benign prostatic hypertrophy)   . Depression   . Diverticulosis of colon   . GERD (gastroesophageal reflux disease)   . Gilbert's syndrome   . History of kidney stones   . HLD (hyperlipidemia)   . HOH (hard of hearing)    refuses to wear his Hearing Aid  . Hypertension   . Inguinal hernia    right  . Internal hemorrhoid   . Normal coronary arteries    a. Cath 07/2010: normal coronaries. Felt to have noncardiac CP/SOB at that time possibly r/t anxiety.  . RA (rheumatoid arthritis) ([place])    bilateral hands/ wrist--  seronegative  . S/P TAVR (transcatheter aortic valve replacement) 03/27/2018   23 mm Edwards Sapien 3 transcatheter heart valve placed via percutaneous right transfemoral approach   . Severe aortic stenosis   . Thyroid nodule    noted 11-2009  . Wears dentures     Past Surgical History:  Procedure Laterality Date  . CARDIAC CATHETERIZATION  07-19-2010  dr Tressia Miners turner   normal coronary arteries,  ef 60%,  moderate aortic stenosis- gradient 49mmHg, normal right heart pressure  . CARDIOVASCULAR STRESS TEST  05/ 2011   dr Marlou Porch   normal low risk perfusion study  . CATARACT EXTRACTION W/ INTRAOCULAR LENS  IMPLANT, BILATERAL  2013  . CATARACT EXTRACTION, BILATERAL    . COLONOSCOPY  2010  approx  . INGUINAL HERNIA REPAIR Right 08/23/2018   Procedure: OPEN RIGHT INGUINAL HERNIA REPAIR WITH MESH;  Surgeon: Armandina Gemma, MD;  Location: [facility];  Service: General;  Laterality: Right;  . INTRAOPERATIVE TRANSTHORACIC  ECHOCARDIOGRAM N/A 03/27/2018   Procedure: INTRAOPERATIVE TRANSTHORACIC ECHOCARDIOGRAM;  Surgeon: Burnell Blanks, MD;  Location: [facility];  Service: Open Heart Surgery;  Laterality: N/A;  . RIGHT/LEFT HEART CATH AND CORONARY ANGIOGRAPHY N/A 02/06/2018   Procedure: RIGHT/LEFT HEART CATH AND CORONARY ANGIOGRAPHY;  Surgeon: Burnell Blanks, MD;  Location: [facility];  Service:  Cardiovascular;  Laterality: N/A;  . THYROID LOBECTOMY Right 05/05/2015   Procedure: RIGHT THYROID LOBECTOMY;  Surgeon: Armandina Gemma, MD;  Location: [facility];  Service: General;  Laterality: Right;  . TRANSANAL HEMORRHOIDAL DEARTERIALIZATION N/A 04/09/2014   Procedure: TRANSANAL HEMORRHOIDAL DEARTERIALIZATION OF INTERNAL HEMORROIDS;  Surgeon: Leighton Ruff, MD;  Location: [facility];  Service: General;  Laterality: N/A;  . TRANSCATHETER AORTIC VALVE REPLACEMENT, TRANSFEMORAL  03/27/2018  . TRANSCATHETER AORTIC VALVE REPLACEMENT, TRANSFEMORAL N/A 03/27/2018   Procedure: TRANSCATHETER AORTIC VALVE REPLACEMENT, TRANSFEMORAL. Edwards SAPIEN 3 Transcatheter Heart Valve 63mm.;  Surgeon: Burnell Blanks, MD;  Location: [place] [facility];  Service: Open Heart Surgery;  Laterality: N/A;  . TRANSTHORACIC ECHOCARDIOGRAM  11-26-2013   moderate focal basal and mild LVH/  ef 34-74%/  grade I diastolic dysfunction/ mild LAE/  moderate calcification with stenosis AV with mild regurg,  gradients 35 abd 58 mmHg /  mild TR    Prior to Admission medications   Medication Sig Start Date End Date Taking? Authorizing Provider  Ascorbic Acid (VITAMIN C) 1000 MG tablet Take 1,000 mg by mouth daily after lunch.   Yes [provider]  aspirin 81 MG tablet Take 1 tablet (81 mg total) by mouth daily. Patient taking differently: Take 81 mg by mouth daily.  06/05/18  Yes Barrett, Evelene Croon, PA-C  cholecalciferol (VITAMIN D3) 25 MCG (1000 UT) tablet Take 1,000 Units by mouth daily.   Yes [provider]  dutasteride (AVODART) 0.5 MG capsule Take 0.5 mg by mouth daily.   Yes [provider]  escitalopram (LEXAPRO) 20 MG tablet Take 1 tablet (20 mg total) by mouth daily. Patient taking differently: Take 20 mg by mouth at bedtime.  09/06/16  Yes Angiulli, Lavon Paganini, PA-C  folic acid (FOLVITE) 1 MG tablet Take 1 mg by mouth every evening.  01/27/15  Yes [provider]  furosemide (LASIX) 20  MG tablet Take 20 mg by mouth.   Yes [provider]  ibuprofen (ADVIL,MOTRIN) 800 MG tablet Take 800 mg by mouth every morning.   Yes [provider]  losartan (COZAAR) 25 MG tablet Take 25 mg by mouth daily.  01/21/18  Yes [provider]  Melatonin 5 MG TABS Take 5 mg by mouth at bedtime as needed (for sleep).    Yes [provider]  methotrexate (RHEUMATREX) 2.5 MG tablet Take 22.5 mg by mouth every Sunday.  07/13/13  Yes [provider]  Omega-3 Fatty Acids (FISH OIL) 1000 MG CAPS Take 1,000 mg by mouth daily.   Yes [provider]  simvastatin (ZOCOR) 20 MG tablet TAKE 1 TABLET BY MOUTH EVERY NIGHT AT BEDTIME Patient taking differently: Take 20 mg by mouth every evening.  05/06/14  Yes Jerline Pain, MD  traMADol (ULTRAM) 50 MG tablet Take 1-2 tablets (50-100 mg total) by mouth every 6 (six) hours as needed. Patient taking differently: Take 50-100 mg by mouth every 6 (six) hours as needed for moderate pain.  08/23/18  Yes Armandina Gemma, MD  vitamin E 400 UNIT capsule Take 400 Units by mouth daily after lunch.   Yes [provider]  clopidogrel (PLAVIX) 75 MG tablet Take 1 tablet (75 mg total) by mouth daily with breakfast. Patient not taking: Reported on 09/22/2018 03/29/18   Eileen Stanford, PA-C    Current Facility-Administered Medications  Medication Dose Route Frequency Provider Last Rate Last Dose  . 0.9 %  sodium chloride infusion   Intravenous PRN Patrecia Pour, Christean Grief, MD 10 mL/hr at 09/22/18 2300    . dutasteride (AVODART) capsule 0.5 mg  0.5 mg Oral Daily Patrecia Pour, Christean Grief, MD   0.5 mg at 09/23/18 0839  . enoxaparin (LOVENOX) injection 40 mg  40 mg Subcutaneous Q24H Patrecia Pour, Christean Grief, MD   40 mg at 09/22/18 2218  . escitalopram (LEXAPRO) tablet 20 mg  20 mg Oral Daily Patrecia Pour, Christean Grief, MD   20 mg at 09/23/18 0839  . folic acid (FOLVITE) tablet 1 mg  1 mg Oral QPM Patrecia Pour, Christean Grief, MD   1 mg at 09/22/18 2218   . ondansetron (ZOFRAN) tablet 4 mg  4 mg Oral Q6H PRN Patrecia Pour, Christean Grief, MD       Or  . ondansetron [facility]) injection 4 mg  4 mg Intravenous Q6H PRN Patrecia Pour, Christean Grief, MD      . oxyCODONE (Oxy IR/ROXICODONE) immediate release tablet 5 mg  5 mg Oral Q4H PRN Patrecia Pour, Christean Grief, MD      . piperacillin-tazobactam (ZOSYN) IVPB 3.375 g  3.375 g Intravenous Q8H Franky Macho, RPH 12.5 mL/hr at 09/23/18 0703 3.375 g at 09/23/18 0703    Allergies as of 09/22/2018 - Review Complete 09/22/2018  Allergen Reaction Noted  . Ativan [lorazepam] Other (See Comments) 08/25/2016  . Phenergan [promethazine hcl] Hypertension 10/12/2016  . Eggs or egg-derived products Rash 08/01/2013    Family History  Problem Relation Age of Onset  . Pulmonary embolism Mother 29       caused by complications of surgery  . CVA Father   . Diabetes Brother   . Diabetes Brother   . Breast cancer Sister     Social History   Socioeconomic History  . Marital status: Widowed    Spouse name: Not on file  . Number of children: Not on file  . Years of education: Not on file  . Highest education level: Not on file  Occupational History  . Not on file  Social Needs  . Financial resource strain: Not on file  . Food insecurity:    Worry: Not on file    Inability: Not on file  . Transportation needs:    Medical: Not on file    Non-medical: Not on file  Tobacco Use  . Smoking status: Former Smoker    Years: 40.00    Types: Cigarettes    Start date: 06/13/1973    Last attempt to quit: 04/03/1981    Years since quitting: 37.4  . Smokeless tobacco: Never Used  Substance and Sexual Activity  . Alcohol use: Yes    Comment: occasional  . Drug use: No  . Sexual activity: Not on file  Lifestyle  . Physical activity:    Days per week: Not on file    Minutes per session: Not on file  . Stress: Not on file  Relationships  . Social connections:    Talks on phone: Not on file    Gets together: Not on file     Attends religious service: Not on file    Active member of club or organization: Not on file    Attends meetings of clubs  or organizations: Not on file    Relationship status: Not on file  . Intimate partner violence:    Fear of current or ex partner: Not on file    Emotionally abused: Not on file    Physically abused: Not on file    Forced sexual activity: Not on file  Other Topics Concern  . Not on file  Social History Narrative  . Not on file    Review of Systems: Positive for: GI: Described in detail in HPI.    Gen: Denies any fever, chills, rigors, night sweats, anorexia, fatigue, weakness, malaise, involuntary weight loss, and sleep disorder CV: Denies chest pain, angina, palpitations, syncope, orthopnea, PND, peripheral edema, and claudication. Resp:  dyspnea,Denies cough, sputum, wheezing, coughing up blood. GU : Denies urinary burning, blood in urine, urinary frequency, urinary hesitancy, nocturnal urination, and urinary incontinence. MS: Denies joint pain or swelling.  Denies muscle weakness, cramps, atrophy.  Derm: Denies rash, itching, oral ulcerations, hives, unhealing ulcers.  Psych: Denies depression, anxiety, memory loss, suicidal ideation, hallucinations,  and confusion. Heme: Denies bruising, bleeding, and enlarged lymph nodes. Neuro:  Denies any headaches, dizziness, paresthesias. Endo:  Denies any problems with DM, thyroid, adrenal function.  Physical Exam: Vital signs in last 24 hours: Temp:  [97.7 F (36.5 C)-101.1 F (38.4 C)] 97.7 F (36.5 C) (04/12 0420) Pulse Rate:  [61-78] 61 (04/12 0420) Resp:  [15-30] 15 (04/12 0420) BP: (115-155)/(52-93) 115/58 (04/12 0836) SpO2:  [91 %-97 %] 95 % (04/12 0420) Weight:  [77.9 kg-78.5 kg] 77.9 kg (04/12 0420) Last BM Date: (unknown per patient)  Physical examination was not performed as patient was under precautions, nasopharyngeal swab sent for coronavirus testing.  I spoke with patient over the phone, he speaks  little [place] and is hard of hearing, but denied abdominal pain, nausea or vomiting since admission.     Lab Results: Recent Labs    09/22/18 1044 09/23/18 0537  WBC 7.3 6.8  HGB 13.0 12.2*  HCT 40.0 38.5*  PLT PLATELET CLUMPS NOTED ON SMEAR, COUNT APPEARS DECREASED 100*   BMET Recent Labs    09/22/18 1044 09/23/18 0537  NA 137 138  K 3.9 3.9  CL 102 102  CO2 23 25  GLUCOSE 157* 133*  BUN 11 17  CREATININE 1.04 1.46*  CALCIUM 9.0 8.5*   LFT Recent Labs    09/23/18 0537  PROT 6.5  ALBUMIN 3.4*  AST 226*  ALT 334*  ALKPHOS 179*  BILITOT 7.3*   PT/INR Recent Labs    09/23/18 0537  LABPROT 15.8*  INR 1.3*    Studies/Results: Dg Chest Port 1 View  Result Date: 09/22/2018 CLINICAL DATA:  Acute onset of mid chest pain that began last night. Current history of CHF and hypertension. Prior [place]. EXAM: PORTABLE CHEST 1 VIEW COMPARISON:  CTA chest 06/04/2018. Chest x-rays 06/04/2018 and earlier. FINDINGS: Cardiac silhouette normal in size, unchanged. Prior CABG. Thoracic aorta mildly atherosclerotic and tortuous. Hilar and mediastinal contours otherwise unremarkable. Mildly prominent bronchovascular markings diffusely and mild central peribronchial thickening, unchanged. Lungs otherwise clear. No localized airspace consolidation. No pleural effusions. No pneumothorax. Normal pulmonary vascularity. IMPRESSION: Stable mild changes of chronic bronchitis and/or asthma. No acute cardiopulmonary disease. Electronically Signed   By: Evangeline Dakin M.D.   On: 09/22/2018 11:25   US Abdomen Limited Ruq  Result Date: 09/22/2018 CLINICAL DATA:  Elevated liver function test. EXAM: ULTRASOUND ABDOMEN LIMITED RIGHT UPPER QUADRANT COMPARISON:  None. FINDINGS: Gallbladder: Mixed echogenicity material within the  gallbladder, presumably sludge. Hyperechoic masslike focus within the gallbladder measures 1.3 x 1.2 cm, most likely tumefactive sludge. No gallbladder wall thickening or  pericholecystic fluid. No sonographic Murphy's sign elicited during the exam. Common bile duct: Diameter: 5 mm Liver: Liver echotexture is coarse and there is nodularity of the peripheral liver margins suggesting cirrhosis. No focal mass or lesion within the liver. Portal vein is patent on color Doppler imaging with normal direction of blood flow towards the liver. Incidental note of a RIGHT renal cyst, measuring 3.6 cm, corresponding to a benign cyst demonstrated on CT dated 02/07/2018. IMPRESSION: 1. No evidence of cholecystitis. No discrete gallstones identified. Hyperechoic masslike structure within the gallbladder measures 1.3 cm, most likely tumefactive sludge. Additional layering sludge within the gallbladder. 2. No bile duct dilatation. 3. Probable liver cirrhosis. Electronically Signed   By: Franki Cabot M.D.   On: 09/22/2018 13:22    Impression: Elevated liver enzymes, fever on presentation of 101F, low-grade fever of 99.2/100.2 F overnight MRI/MRCP ordered- on hold until results of coronavirus testing are back Patient has been started on IV Zosyn, is on oxycodone 5 mg p.o. as needed for moderate to severe pain along with Zofran as needed  No leukocytosis T bili has increased from 4.3 to 7.3, however AST has trended down from 542 to 226 and ALT has trended down from 496 to 334, ALP remains at 152/179 Normal lipase   Plan: Will await results of MRCP, discussed the same with patient's hospitalist and his daughter over the phone. CBD appeared normal on ultrasound, possible passed CBD stone. Other causes of abnormal LFTs include side effect of methotrexate, which is currently on hold.  Continue IV Zosyn and monitoring Meanwhile we will start patient on oral diet   LOS: 1 day   Ronnette Juniper, MD  09/23/2018, 9:07 AM  Pager [phone] If no answer or after 5 PM call [phone]

## 2018-09-24 ENCOUNTER — Inpatient Hospital Stay (HOSPITAL_COMMUNITY): Payer: Medicare Other

## 2018-09-24 DIAGNOSIS — I5032 Chronic diastolic (congestive) heart failure: Secondary | ICD-10-CM

## 2018-09-24 DIAGNOSIS — K8050 Calculus of bile duct without cholangitis or cholecystitis without obstruction: Secondary | ICD-10-CM

## 2018-09-24 DIAGNOSIS — I350 Nonrheumatic aortic (valve) stenosis: Secondary | ICD-10-CM

## 2018-09-24 DIAGNOSIS — K8000 Calculus of gallbladder with acute cholecystitis without obstruction: Secondary | ICD-10-CM

## 2018-09-24 DIAGNOSIS — Z952 Presence of prosthetic heart valve: Secondary | ICD-10-CM

## 2018-09-24 DIAGNOSIS — N179 Acute kidney failure, unspecified: Secondary | ICD-10-CM

## 2018-09-24 DIAGNOSIS — E7800 Pure hypercholesterolemia, unspecified: Secondary | ICD-10-CM

## 2018-09-24 DIAGNOSIS — I10 Essential (primary) hypertension: Secondary | ICD-10-CM

## 2018-09-24 DIAGNOSIS — R932 Abnormal findings on diagnostic imaging of liver and biliary tract: Secondary | ICD-10-CM

## 2018-09-24 LAB — CBC WITH DIFFERENTIAL/PLATELET
Abs Immature Granulocytes: 0.02 10*3/uL (ref 0.00–0.07)
Basophils Absolute: 0 10*3/uL (ref 0.0–0.1)
Basophils Relative: 0 %
Eosinophils Absolute: 0.2 10*3/uL (ref 0.0–0.5)
Eosinophils Relative: 3 %
HCT: 36.4 % — ABNORMAL LOW (ref 39.0–52.0)
Hemoglobin: 12 g/dL — ABNORMAL LOW (ref 13.0–17.0)
Immature Granulocytes: 0 %
Lymphocytes Relative: 11 %
Lymphs Abs: 0.9 10*3/uL (ref 0.7–4.0)
MCH: 29.9 pg (ref 26.0–34.0)
MCHC: 33 g/dL (ref 30.0–36.0)
MCV: 90.8 fL (ref 80.0–100.0)
Monocytes Absolute: 0.8 10*3/uL (ref 0.1–1.0)
Monocytes Relative: 10 %
Neutro Abs: 6.1 10*3/uL (ref 1.7–7.7)
Neutrophils Relative %: 76 %
Platelets: 107 10*3/uL — ABNORMAL LOW (ref 150–400)
RBC: 4.01 MIL/uL — ABNORMAL LOW (ref 4.22–5.81)
RDW: 12.8 % (ref 11.5–15.5)
WBC: 8.1 10*3/uL (ref 4.0–10.5)
nRBC: 0 % (ref 0.0–0.2)

## 2018-09-24 LAB — HEPATITIS PANEL, ACUTE
HCV Ab: 0.2 s/co ratio (ref 0.0–0.9)
Hep A IgM: NEGATIVE
Hep B C IgM: NEGATIVE
Hepatitis B Surface Ag: NEGATIVE

## 2018-09-24 LAB — COMPREHENSIVE METABOLIC PANEL
ALT: 217 U/L — ABNORMAL HIGH (ref 0–44)
AST: 110 U/L — ABNORMAL HIGH (ref 15–41)
Albumin: 3.1 g/dL — ABNORMAL LOW (ref 3.5–5.0)
Alkaline Phosphatase: 192 U/L — ABNORMAL HIGH (ref 38–126)
Anion gap: 11 (ref 5–15)
BUN: 17 mg/dL (ref 8–23)
CO2: 25 mmol/L (ref 22–32)
Calcium: 8.7 mg/dL — ABNORMAL LOW (ref 8.9–10.3)
Chloride: 103 mmol/L (ref 98–111)
Creatinine, Ser: 1.28 mg/dL — ABNORMAL HIGH (ref 0.61–1.24)
GFR calc Af Amer: 58 mL/min — ABNORMAL LOW (ref >60)
GFR calc non Af Amer: 50 mL/min — ABNORMAL LOW (ref >60)
Glucose, Bld: 120 mg/dL — ABNORMAL HIGH (ref 70–99)
Potassium: 4 mmol/L (ref 3.5–5.1)
Sodium: 139 mmol/L (ref 135–145)
Total Bilirubin: 3.3 mg/dL — ABNORMAL HIGH (ref 0.3–1.2)
Total Protein: 6.4 g/dL — ABNORMAL LOW (ref 6.5–8.1)

## 2018-09-24 LAB — NOVEL CORONAVIRUS, NAA (HOSP ORDER, SEND-OUT TO REF LAB; TAT 18-24 HRS): SARS-CoV-2, NAA: NOT DETECTED

## 2018-09-24 LAB — MAGNESIUM: Magnesium: 2 mg/dL (ref 1.7–2.4)

## 2018-09-24 MED ORDER — GADOBUTROL 1 MMOL/ML IV SOLN
8.0000 mL | Freq: Once | INTRAVENOUS | Status: AC | PRN
  Administered 2018-09-24: 11:00:00 8 mL via INTRAVENOUS

## 2018-09-24 NOTE — Progress Notes (Signed)
[facility] Progress Note  Subjective: The patient is doing well today. He has no complaints of abdominal pain. I reviewed his MRCP. He has gallstones and common bile duct stones. I called while in the room and spoke to his daughter who speaks very good [place]. I explained the situation. She understands. She is a PhD. I explained the next step would be ERCP with sphincterotomy and stone extraction from the CBD. I explained the potential risks of bleeding, infection, perforation, and pancreatitis. She translated the information to her father and he understands and agrees to the procedure.  Objective: Vital signs in last 24 hours: Temp:  [98 F (36.7 C)-98.1 F (36.7 C)] 98 F (36.7 C) (04/13 1231) Pulse Rate:  [57-67] 57 (04/13 1231) Resp:  [16-20] 20 (04/13 1231) BP: (138-169)/(58-69) 138/58 (04/13 1231) SpO2:  [94 %-96 %] 94 % (04/13 1231) Weight:  [76.7 kg] 76.7 kg (04/13 1231) Weight change:    PE:  No distress  Abdomen soft and nontender  Lab Results: Results for orders placed or performed during the hospital encounter of 09/22/18 (from the past 24 hour(s))  CBC with Differential/Platelet     Status: Abnormal   Collection Time: 09/24/18  6:40 AM  Result Value Ref Range   WBC 8.1 4.0 - 10.5 K/uL   RBC 4.01 (L) 4.22 - 5.81 MIL/uL   Hemoglobin 12.0 (L) 13.0 - 17.0 g/dL   HCT 36.4 (L) 39.0 - 52.0 %   MCV 90.8 80.0 - 100.0 fL   MCH 29.9 26.0 - 34.0 pg   MCHC 33.0 30.0 - 36.0 g/dL   RDW 12.8 11.5 - 15.5 %   Platelets 107 (L) 150 - 400 K/uL   nRBC 0.0 0.0 - 0.2 %   Neutrophils Relative % 76 %   Neutro Abs 6.1 1.7 - 7.7 K/uL   Lymphocytes Relative 11 %   Lymphs Abs 0.9 0.7 - 4.0 K/uL   Monocytes Relative 10 %   Monocytes Absolute 0.8 0.1 - 1.0 K/uL   Eosinophils Relative 3 %   Eosinophils Absolute 0.2 0.0 - 0.5 K/uL   Basophils Relative 0 %   Basophils Absolute 0.0 0.0 - 0.1 K/uL   Immature Granulocytes 0 %   Abs Immature Granulocytes 0.02 0.00 - 0.07 K/uL   Comprehensive metabolic panel     Status: Abnormal   Collection Time: 09/24/18  6:40 AM  Result Value Ref Range   Sodium 139 135 - 145 mmol/L   Potassium 4.0 3.5 - 5.1 mmol/L   Chloride 103 98 - 111 mmol/L   CO2 25 22 - 32 mmol/L   Glucose, Bld 120 (H) 70 - 99 mg/dL   BUN 17 8 - 23 mg/dL   Creatinine, Ser 1.28 (H) 0.61 - 1.24 mg/dL   Calcium 8.7 (L) 8.9 - 10.3 mg/dL   Total Protein 6.4 (L) 6.5 - 8.1 g/dL   Albumin 3.1 (L) 3.5 - 5.0 g/dL   AST 110 (H) 15 - 41 U/L   ALT 217 (H) 0 - 44 U/L   Alkaline Phosphatase 192 (H) 38 - 126 U/L   Total Bilirubin 3.3 (H) 0.3 - 1.2 mg/dL   GFR calc non Af Amer 50 (L) >60 mL/min   GFR calc Af Amer 58 (L) >60 mL/min   Anion gap 11 5 - 15  Magnesium     Status: None   Collection Time: 09/24/18  6:40 AM  Result Value Ref Range   Magnesium 2.0 1.7 - 2.4 mg/dL  Studies/Results: Mr 3d Recon At Scanner  Result Date: 09/24/2018 CLINICAL DATA:  85 year old male with history of common bile duct stone and biliary obstruction. EXAM: MRI ABDOMEN WITHOUT AND WITH CONTRAST (INCLUDING MRCP) TECHNIQUE: Multiplanar multisequence MR imaging of the abdomen was performed both before and after the administration of intravenous contrast. Heavily T2-weighted images of the biliary and pancreatic ducts were obtained, and three-dimensional MRCP images were rendered by post processing. CONTRAST:  8 mL of Gadavist. COMPARISON:  No priors. Abdominal ultrasound 09/22/2018. CT the abdomen and pelvis 02/07/2018. FINDINGS: Comment: Today's study is slightly limited by patient respiratory motion. Lower chest: Unremarkable. Hepatobiliary: No suspicious cystic or solid hepatic lesions. No intrahepatic biliary ductal dilatation. Common bile duct is borderline dilated measuring 7 mm in the porta hepatis (within normal limits for the patient's age). Several small filling defects are noted in the common bile duct, largest of which measures up to 5 mm distally shortly before the ampulla. In  addition, amorphous filling defects are noted in the gallbladder, indicative of a combination of biliary sludge and small gallstones. Gallbladder is moderately distended, with some mild gallbladder wall thickening (6 mm) and increased T2 signal intensity in the wall, indicative of edema. Trace volume of T2 signal intensity adjacent to the gallbladder, indicative of pericholecystic fluid. This is contiguous with increased T2 signal intensity adjacent to the descending portion of the duodenum. Pancreas: No pancreatic mass. Small amount of increased T2 signal intensity adjacent to the head of the pancreas and the adjacent descending duodenum. No pancreatic ductal dilatation noted on MRCP images. No peripancreatic fluid collections. Spleen:  Unremarkable. Adrenals/Urinary Tract: Multiple T1 hypointense, T2 hyperintense, nonenhancing lesions in both kidneys, compatible with simple cysts, largest of which is exophytic in the anterior aspect of the interpolar region of the right kidney measuring 3.6 cm in diameter. No definite suspicious renal lesions. No hydroureteronephrosis in the visualized portions of the abdomen. Bilateral adrenal glands are normal in appearance. Stomach/Bowel: Normal appearance of the stomach. Increased T2 signal intensity surrounding the descending portion of the duodenum. Remaining portions of visualized small bowel and colon are unremarkable. Vascular/Lymphatic: Aortic atherosclerosis, without evidence of aneurysm in the abdominal vasculature. No lymphadenopathy noted in the abdomen. Other: No significant volume of ascites in the visualized portions of the peritoneal cavity. Musculoskeletal: No aggressive appearing osseous lesions are noted in the visualized portions of the skeleton. IMPRESSION: 1. Study is positive for biliary sludge and cholelithiasis as well as choledocholithiasis. At this time, there is no evidence of frank biliary tract obstruction despite the presence of common bile duct  stones measuring up to 5 mm in the periampullary region. Notably, the gallbladder is moderately distended with some mild gallbladder wall thickening and edema, as well as trace amount of pericholecystic fluid. There is also some fluid adjacent to the head of the pancreas and descending portion of the duodenum, which could indicate an associated pancreatitis. 2. Aortic atherosclerosis. Electronically Signed   By: Vinnie Langton M.D.   On: 09/24/2018 11:50   Mr Abdomen Mrcp Moise Boring Contast  Result Date: 09/24/2018 CLINICAL DATA:  85 year old male with history of common bile duct stone and biliary obstruction. EXAM: MRI ABDOMEN WITHOUT AND WITH CONTRAST (INCLUDING MRCP) TECHNIQUE: Multiplanar multisequence MR imaging of the abdomen was performed both before and after the administration of intravenous contrast. Heavily T2-weighted images of the biliary and pancreatic ducts were obtained, and three-dimensional MRCP images were rendered by post processing. CONTRAST:  8 mL of Gadavist. COMPARISON:  No priors. Abdominal  ultrasound 09/22/2018. CT the abdomen and pelvis 02/07/2018. FINDINGS: Comment: Today's study is slightly limited by patient respiratory motion. Lower chest: Unremarkable. Hepatobiliary: No suspicious cystic or solid hepatic lesions. No intrahepatic biliary ductal dilatation. Common bile duct is borderline dilated measuring 7 mm in the porta hepatis (within normal limits for the patient's age). Several small filling defects are noted in the common bile duct, largest of which measures up to 5 mm distally shortly before the ampulla. In addition, amorphous filling defects are noted in the gallbladder, indicative of a combination of biliary sludge and small gallstones. Gallbladder is moderately distended, with some mild gallbladder wall thickening (6 mm) and increased T2 signal intensity in the wall, indicative of edema. Trace volume of T2 signal intensity adjacent to the gallbladder, indicative of  pericholecystic fluid. This is contiguous with increased T2 signal intensity adjacent to the descending portion of the duodenum. Pancreas: No pancreatic mass. Small amount of increased T2 signal intensity adjacent to the head of the pancreas and the adjacent descending duodenum. No pancreatic ductal dilatation noted on MRCP images. No peripancreatic fluid collections. Spleen:  Unremarkable. Adrenals/Urinary Tract: Multiple T1 hypointense, T2 hyperintense, nonenhancing lesions in both kidneys, compatible with simple cysts, largest of which is exophytic in the anterior aspect of the interpolar region of the right kidney measuring 3.6 cm in diameter. No definite suspicious renal lesions. No hydroureteronephrosis in the visualized portions of the abdomen. Bilateral adrenal glands are normal in appearance. Stomach/Bowel: Normal appearance of the stomach. Increased T2 signal intensity surrounding the descending portion of the duodenum. Remaining portions of visualized small bowel and colon are unremarkable. Vascular/Lymphatic: Aortic atherosclerosis, without evidence of aneurysm in the abdominal vasculature. No lymphadenopathy noted in the abdomen. Other: No significant volume of ascites in the visualized portions of the peritoneal cavity. Musculoskeletal: No aggressive appearing osseous lesions are noted in the visualized portions of the skeleton. IMPRESSION: 1. Study is positive for biliary sludge and cholelithiasis as well as choledocholithiasis. At this time, there is no evidence of frank biliary tract obstruction despite the presence of common bile duct stones measuring up to 5 mm in the periampullary region. Notably, the gallbladder is moderately distended with some mild gallbladder wall thickening and edema, as well as trace amount of pericholecystic fluid. There is also some fluid adjacent to the head of the pancreas and descending portion of the duodenum, which could indicate an associated pancreatitis. 2. Aortic  atherosclerosis. Electronically Signed   By: Vinnie Langton M.D.   On: 09/24/2018 11:50      Assessment: Gallstones  Choledocholithiasis  Plan:   ERCP with sphincterotomy and stone extraction. After that we will need to get surgery involved for cholecystectomy.    SAM F Jaelah Hauth 09/24/2018, 2:40 PM  Pager: [phone] If no answer or after 5 PM call [phone]

## 2018-09-24 NOTE — Progress Notes (Signed)
PROGRESS NOTE    [name]  ZOX:[id number] DOB: [birth year] DOA: 09/22/2018 PCP: Lajean Manes, MD   Brief Narrative:  85 y.o. WM RUSSIAN speaking PMHx Essential HTN, chronic diastolic CHF, severe aortic stenosis status post TAVR, BPH, GERD rheumatoid arthritis on methotrexate   Presented to the emergency department complaining of chest pain/shortness of breath with nausea and vomiting.  Patient is Russian-speaking and history was obtained via daughter through phone call who is a physician.  Apparently patient developed slight shortness of breath yesterday along with nausea and vomiting.  Shortness of breath has improved now however he was vomiting last night.  This morning patient developed chest/epigastric pain and continues to be severely nauseous.  Patient's daughter reported patient denies any fever, cough, weakness, COVID-19 positive contacts and recent travel.  Patient family called his PCP who recommended to be evaluated in the ED.   ED Course: Upon ED evaluation work-up revealed elevated LFTs, BNP 142, Right upper quadrant ultrasound shows no evidence of cholecystitis however there is a hyperechoic masslike structure within the gallbladder measures 1.3 cm possible sludge.  No bile duct dilation.  Probable liver cirrhosis.  Chest x-ray with no signs of cardiopulmonary disease.  While in the ED patient developed fever T-max of 1.1.  Patient feels that his shortness of breath has significantly improved and his main concern now is abdominal pain and nausea.  Patient was treated with Zosyn.  GI was consulted and recommended MRCP. [facility] called for admission.     Subjective: 4/13 (through translation service spoke with patient), negative S OB, negative CP, negative abdominal pain.   Assessment & Plan:   Active Problems:   Sinus bradycardia   Benign essential HTN   Severe aortic stenosis   S/P TAVR (transcatheter aortic valve replacement)   RA (rheumatoid arthritis) (HCC)  HLD (hyperlipidemia)   Chronic diastolic CHF (congestive heart failure) (HCC)   Elevated LFTs   AKI (acute kidney injury) ([place])  Cholelithiasis/Choledocholithiasis - MRCP confirms sludge with stones and biliary tract obstruction along with distended gallbladder. - Per GI note plan is for ERCP with sphincterotomy and stone extraction - Once GI has completed ERCP surgery will get involved for cholecystectomy. --Currently patient not experiencing any pain - Clear liquids as tolerated.  N.p.o. after midnight -Monitor closely liver enzymes.  Cholangitis - No evidence of cholangitis however, given that patient will undergo 2 surgical procedures will continue with his current antibiotics.  Chronic diastolic CHF - Appears euvolemic currently - Strict in and out -Daily weight  Essential HTN -Was on Lasix and losartan at home will hold currently BP controlled -See CHF  AKI - March 2020 creatinine WNL - Slightly elevated but trending down. -Monitor creatinine closely  Rule out COVID-19 -  COVID-19 negative -Patient's presenting S OB and fever most likely secondary to his cholelithiasis/choledocholithiasis.  Currently asymptomatic      DVT prophylaxis: Lovenox Code Status: DNR Family Communication: None Disposition Plan: Per GI and CCS   Consultants:  [facility]    Procedures/Significant Events:  4/13 MRCP abdomen:-POSITIVE  biliary sludge and cholelithiasis as well as choledocholithiasis. At this time, there is no evidence of frank biliary tract obstruction despite the presence of common bile duct stones measuring up to 5 mm in the periampullary region.  -Gallbladder is moderately distended with some mild gallbladder wall thickening and edema, as well as trace amount of pericholecystic fluid.  -fluid adjacent to the head of the pancreas and descending portion of the duodenum, which could indicate an  associated pancreatitis.    I have personally reviewed and interpreted all  radiology studies and my findings are as above.  VENTILATOR SETTINGS: None   Cultures 4/11 COVID-19 negative 4/11 blood pending 4/12 respiratory virus panel negative    Antimicrobials: Anti-infectives (From admission, onward)   Start     Stop   09/22/18 2200  piperacillin-tazobactam (ZOSYN) IVPB 3.375 g         09/22/18 1515  piperacillin-tazobactam (ZOSYN) IVPB 3.375 g     09/22/18 1635        Devices    LINES / TUBES:      Continuous Infusions:  sodium chloride 10 mL/hr at 09/22/18 2300   piperacillin-tazobactam (ZOSYN)  IV 3.375 g (09/24/18 1429)     Objective: Vitals:   09/23/18 1938 09/23/18 2044 09/24/18 0501 09/24/18 1231  BP: (!) 169/65 (!) 166/61 (!) 142/68 (!) 138/58  Pulse: (!) 58 (!) 59 67 (!) 57  Resp: 20 20  20   Temp: 98 F (36.7 C) 98.1 F (36.7 C) 98 F (36.7 C) 98 F (36.7 C)  TempSrc: Oral Oral Oral Oral  SpO2: 96% 95% 96% 94%  Weight:    76.7 kg  Height:        Intake/Output Summary (Last 24 hours) at 09/24/2018 1644 Last data filed at 09/24/2018 1431 Gross per 24 hour  Intake 579.84 ml  Output 350 ml  Net 229.84 ml   Filed Weights   09/22/18 1035 09/23/18 0420 09/24/18 1231  Weight: 78.5 kg 77.9 kg 76.7 kg    Examination:  General: A/O x4 no acute respiratory distress Eyes: negative scleral hemorrhage, negative anisocoria, negative icterus ENT: Negative Runny nose, negative gingival bleeding, Neck:  Negative scars, masses, torticollis, lymphadenopathy, JVD Lungs: Clear to auscultation bilaterally without wheezes or crackles Cardiovascular: Regular rate and rhythm without murmur gallop or rub normal S1 and S2 Abdomen: negative abdominal pain, nondistended, positive soft, bowel sounds, no rebound, no ascites, no appreciable mass Extremities: No significant cyanosis, clubbing, or edema bilateral lower extremities Skin: Negative rashes, lesions, ulcers Psychiatric:  Negative depression, negative anxiety, negative fatigue,  negative mania  Central nervous system:  Cranial nerves II through XII intact, tongue/uvula midline, all extremities muscle strength 5/5, sensation intact throughout, negative dysarthria, negative expressive aphasia, negative receptive aphasia.  .     Data Reviewed: Care during the described time interval was provided by me .  I have reviewed this patient's available data, including medical history, events of note, physical examination, and all test results as part of my evaluation.   CBC: Recent Labs  Lab 09/22/18 1044 09/23/18 0537 09/24/18 0640  WBC 7.3 6.8 8.1  NEUTROABS 5.7  --  6.1  HGB 13.0 12.2* 12.0*  HCT 40.0 38.5* 36.4*  MCV 92.0 90.6 90.8  PLT PLATELET CLUMPS NOTED ON SMEAR, COUNT APPEARS DECREASED 100* 841*   Basic Metabolic Panel: Recent Labs  Lab 09/22/18 1044 09/23/18 0537 09/24/18 0640  NA 137 138 139  K 3.9 3.9 4.0  CL 102 102 103  CO2 23 25 25   GLUCOSE 157* 133* 120*  BUN 11 17 17   CREATININE 1.04 1.46* 1.28*  CALCIUM 9.0 8.5* 8.7*  MG  --   --  2.0   GFR: Estimated Creatinine Clearance: 36.6 mL/min (A) (by C-G formula based on SCr of 1.28 mg/dL (H)). Liver Function Tests: Recent Labs  Lab 09/22/18 1044 09/23/18 0537 09/24/18 0640  AST 542* 226* 110*  ALT 496* 334* 217*  ALKPHOS 152* 179* 192*  BILITOT 4.3* 7.3* 3.3*  PROT 7.0 6.5 6.4*  ALBUMIN 3.9 3.4* 3.1*   Recent Labs  Lab 09/22/18 1044  LIPASE 41   No results for input(s): AMMONIA in the last 168 hours. Coagulation Profile: Recent Labs  Lab 09/23/18 0537  INR 1.3*   Cardiac Enzymes: Recent Labs  Lab 09/22/18 1044 09/22/18 1339  TROPONINI <0.03 <0.03   BNP (last 3 results) No results for input(s): PROBNP in the last 8760 hours. HbA1C: No results for input(s): HGBA1C in the last 72 hours. CBG: No results for input(s): GLUCAP in the last 168 hours. Lipid Profile: No results for input(s): CHOL, HDL, LDLCALC, TRIG, CHOLHDL, LDLDIRECT in the last 72 hours. Thyroid  Function Tests: No results for input(s): TSH, T4TOTAL, FREET4, T3FREE, THYROIDAB in the last 72 hours. Anemia Panel: No results for input(s): VITAMINB12, FOLATE, FERRITIN, TIBC, IRON, RETICCTPCT in the last 72 hours. Urine analysis:    Component Value Date/Time   COLORURINE AMBER (A) 09/22/2018 1442   APPEARANCEUR CLEAR 09/22/2018 1442   LABSPEC 1.017 09/22/2018 1442   PHURINE 7.0 09/22/2018 1442   GLUCOSEU NEGATIVE 09/22/2018 1442   HGBUR NEGATIVE 09/22/2018 1442   BILIRUBINUR SMALL (A) 09/22/2018 1442   KETONESUR NEGATIVE 09/22/2018 1442   PROTEINUR NEGATIVE 09/22/2018 1442   UROBILINOGEN 1.0 04/14/2014 0116   NITRITE NEGATIVE 09/22/2018 1442   LEUKOCYTESUR NEGATIVE 09/22/2018 1442   Sepsis Labs: @LABRCNTIP (procalcitonin:4,lacticidven:4)  ) Recent Results (from the past 240 hour(s))  Blood culture (routine x 2)     Status: None (Preliminary result)   Collection Time: 09/22/18  3:09 PM  Result Value Ref Range Status   Specimen Description BLOOD SITE NOT SPECIFIED  Final   Special Requests   Final    BOTTLES DRAWN AEROBIC AND ANAEROBIC Blood Culture results may not be optimal due to an inadequate volume of blood received in culture bottles   Culture   Final    NO GROWTH 1 DAY Performed at [facility], [place] [address], [place], [place] [address]    Report Status PENDING  Incomplete  Blood culture (routine x 2)     Status: None (Preliminary result)   Collection Time: 09/22/18  3:14 PM  Result Value Ref Range Status   Specimen Description BLOOD RIGHT ANTECUBITAL  Final   Special Requests   Final    BOTTLES DRAWN AEROBIC AND ANAEROBIC Blood Culture results may not be optimal due to an excessive volume of blood received in culture bottles   Culture   Final    NO GROWTH 1 DAY Performed at [facility], [place] [address] [place], [place] [address]    Report Status PENDING  Incomplete  Novel Coronavirus, NAA (hospital order; send-out to ref lab)     Status: None    Collection Time: 09/22/18 11:19 PM  Result Value Ref Range Status   SARS-CoV-2, NAA NOT DETECTED NOT DETECTED Final    Comment: Negative (Not Detected) results do not exclude infection caused by SARS CoV 2 and should not be used as the sole basis for treatment or other patient management decisions. Optimum specimen types and timing for peak viral levels during infections caused  by SARS CoV 2 have not been determined. Collection of multiple specimens (types and time points) from the same patient may be necessary to detect the virus. Improper specimen collection and handling, sequence variability underlying assay primers and or probes, or the presence of organisms in  quantities less than the limit of detection of the  assay may lead to false negative results. Positive and negative predictive values of testing are highly dependent on prevalence. False negative results are more likely when prevalence of disease is high. Performed at [facility] (NOTE) The expected result is Negative (Not Detected). The SARS CoV 2 test is intended for the presumptive qualitative  detection of nucleic acid from SARS C oV 2 in upper and lower  respiratory specimens. Testing methodology is real time RT PCR. Test results must be correlated with clinical presentation and  evaluated in the context of other laboratory and epidemiologic data.  Test performance can be affected because the epidemiology and  clinical spectrum of infection caused by SARS CoV 2 is not fully  known. For example, the optimum types of specimens to collect and  when during the course of infection these specimens are most likely  to contain detectable viral RNA may not be known. This test has not been Food and Drug Administration (FDA) cleared or  approved and has been authorized by FDA under an Emergency Use  Authorization (EUA). The test is only authorized for the duration of  the declaration that circumstances exist justifying the  authorization  of emergency use of in vitro diagnostic tests for detection and or  diagnosis of SARS CoV 2 under Section 564(b)(1) of the Act, 21 U.S.C.  section [phone] 3(b)(1), unless th e authorization is terminated or  revoked sooner. [facility] is certified under the  Clinical Laboratory Improvement Amendments of 1988 (CLIA), 42 U.S.C.  section [phone], to perform high complexity tests. Performed at [id number] [address], [address], [place], [place] [address] Laboratory Director: Loleta Books, MD Fact Sheet for Healthcare Providers  BankingDealers.co.za Fact Sheet for Patients  StrictlyIdeas.no    Coronavirus Source NASOPHARYNGEAL  Final    Comment: Performed at [facility], [address] [place], [place] [address]  Respiratory Panel by PCR     Status: None   Collection Time: 09/23/18 11:12 AM  Result Value Ref Range Status   Adenovirus NOT DETECTED NOT DETECTED Final   Coronavirus 229E NOT DETECTED NOT DETECTED Final    Comment: (NOTE) The Coronavirus on the Respiratory Panel, DOES NOT test for the novel  Coronavirus (2019 nCoV)    Coronavirus HKU1 NOT DETECTED NOT DETECTED Final   Coronavirus NL63 NOT DETECTED NOT DETECTED Final   Coronavirus OC43 NOT DETECTED NOT DETECTED Final   Metapneumovirus NOT DETECTED NOT DETECTED Final   Rhinovirus / Enterovirus NOT DETECTED NOT DETECTED Final   Influenza A NOT DETECTED NOT DETECTED Final   Influenza B NOT DETECTED NOT DETECTED Final   Parainfluenza Virus 1 NOT DETECTED NOT DETECTED Final   Parainfluenza Virus 2 NOT DETECTED NOT DETECTED Final   Parainfluenza Virus 3 NOT DETECTED NOT DETECTED Final   Parainfluenza Virus 4 NOT DETECTED NOT DETECTED Final   Respiratory Syncytial Virus NOT DETECTED NOT DETECTED Final   Bordetella pertussis NOT DETECTED NOT DETECTED Final   Chlamydophila pneumoniae NOT DETECTED NOT  DETECTED Final   Mycoplasma pneumoniae NOT DETECTED NOT DETECTED Final    Comment: Performed at [facility], [place], [place], [place] [address]         Radiology Studies: Mr 3d Recon At Scanner  Result Date: 09/24/2018 CLINICAL DATA:  85 year old male with history of common bile duct stone and biliary obstruction. EXAM: MRI ABDOMEN WITHOUT AND WITH CONTRAST (INCLUDING MRCP) TECHNIQUE: Multiplanar multisequence MR imaging  of the abdomen was performed both before and after the administration of intravenous contrast. Heavily T2-weighted images of the biliary and pancreatic ducts were obtained, and three-dimensional MRCP images were rendered by post processing. CONTRAST:  8 mL of Gadavist. COMPARISON:  No priors. Abdominal ultrasound 09/22/2018. CT the abdomen and pelvis 02/07/2018. FINDINGS: Comment: Today's study is slightly limited by patient respiratory motion. Lower chest: Unremarkable. Hepatobiliary: No suspicious cystic or solid hepatic lesions. No intrahepatic biliary ductal dilatation. Common bile duct is borderline dilated measuring 7 mm in the porta hepatis (within normal limits for the patient's age). Several small filling defects are noted in the common bile duct, largest of which measures up to 5 mm distally shortly before the ampulla. In addition, amorphous filling defects are noted in the gallbladder, indicative of a combination of biliary sludge and small gallstones. Gallbladder is moderately distended, with some mild gallbladder wall thickening (6 mm) and increased T2 signal intensity in the wall, indicative of edema. Trace volume of T2 signal intensity adjacent to the gallbladder, indicative of pericholecystic fluid. This is contiguous with increased T2 signal intensity adjacent to the descending portion of the duodenum. Pancreas: No pancreatic mass. Small amount of increased T2 signal intensity adjacent to the head of the pancreas and the adjacent descending duodenum. No  pancreatic ductal dilatation noted on MRCP images. No peripancreatic fluid collections. Spleen:  Unremarkable. Adrenals/Urinary Tract: Multiple T1 hypointense, T2 hyperintense, nonenhancing lesions in both kidneys, compatible with simple cysts, largest of which is exophytic in the anterior aspect of the interpolar region of the right kidney measuring 3.6 cm in diameter. No definite suspicious renal lesions. No hydroureteronephrosis in the visualized portions of the abdomen. Bilateral adrenal glands are normal in appearance. Stomach/Bowel: Normal appearance of the stomach. Increased T2 signal intensity surrounding the descending portion of the duodenum. Remaining portions of visualized small bowel and colon are unremarkable. Vascular/Lymphatic: Aortic atherosclerosis, without evidence of aneurysm in the abdominal vasculature. No lymphadenopathy noted in the abdomen. Other: No significant volume of ascites in the visualized portions of the peritoneal cavity. Musculoskeletal: No aggressive appearing osseous lesions are noted in the visualized portions of the skeleton. IMPRESSION: 1. Study is positive for biliary sludge and cholelithiasis as well as choledocholithiasis. At this time, there is no evidence of frank biliary tract obstruction despite the presence of common bile duct stones measuring up to 5 mm in the periampullary region. Notably, the gallbladder is moderately distended with some mild gallbladder wall thickening and edema, as well as trace amount of pericholecystic fluid. There is also some fluid adjacent to the head of the pancreas and descending portion of the duodenum, which could indicate an associated pancreatitis. 2. Aortic atherosclerosis. Electronically Signed   By: Vinnie Langton M.D.   On: 09/24/2018 11:50   Mr Abdomen Mrcp Moise Boring Contast  Result Date: 09/24/2018 CLINICAL DATA:  85 year old male with history of common bile duct stone and biliary obstruction. EXAM: MRI ABDOMEN WITHOUT AND WITH  CONTRAST (INCLUDING MRCP) TECHNIQUE: Multiplanar multisequence MR imaging of the abdomen was performed both before and after the administration of intravenous contrast. Heavily T2-weighted images of the biliary and pancreatic ducts were obtained, and three-dimensional MRCP images were rendered by post processing. CONTRAST:  8 mL of Gadavist. COMPARISON:  No priors. Abdominal ultrasound 09/22/2018. CT the abdomen and pelvis 02/07/2018. FINDINGS: Comment: Today's study is slightly limited by patient respiratory motion. Lower chest: Unremarkable. Hepatobiliary: No suspicious cystic or solid hepatic lesions. No intrahepatic biliary ductal dilatation. Common bile duct is  borderline dilated measuring 7 mm in the porta hepatis (within normal limits for the patient's age). Several small filling defects are noted in the common bile duct, largest of which measures up to 5 mm distally shortly before the ampulla. In addition, amorphous filling defects are noted in the gallbladder, indicative of a combination of biliary sludge and small gallstones. Gallbladder is moderately distended, with some mild gallbladder wall thickening (6 mm) and increased T2 signal intensity in the wall, indicative of edema. Trace volume of T2 signal intensity adjacent to the gallbladder, indicative of pericholecystic fluid. This is contiguous with increased T2 signal intensity adjacent to the descending portion of the duodenum. Pancreas: No pancreatic mass. Small amount of increased T2 signal intensity adjacent to the head of the pancreas and the adjacent descending duodenum. No pancreatic ductal dilatation noted on MRCP images. No peripancreatic fluid collections. Spleen:  Unremarkable. Adrenals/Urinary Tract: Multiple T1 hypointense, T2 hyperintense, nonenhancing lesions in both kidneys, compatible with simple cysts, largest of which is exophytic in the anterior aspect of the interpolar region of the right kidney measuring 3.6 cm in diameter. No  definite suspicious renal lesions. No hydroureteronephrosis in the visualized portions of the abdomen. Bilateral adrenal glands are normal in appearance. Stomach/Bowel: Normal appearance of the stomach. Increased T2 signal intensity surrounding the descending portion of the duodenum. Remaining portions of visualized small bowel and colon are unremarkable. Vascular/Lymphatic: Aortic atherosclerosis, without evidence of aneurysm in the abdominal vasculature. No lymphadenopathy noted in the abdomen. Other: No significant volume of ascites in the visualized portions of the peritoneal cavity. Musculoskeletal: No aggressive appearing osseous lesions are noted in the visualized portions of the skeleton. IMPRESSION: 1. Study is positive for biliary sludge and cholelithiasis as well as choledocholithiasis. At this time, there is no evidence of frank biliary tract obstruction despite the presence of common bile duct stones measuring up to 5 mm in the periampullary region. Notably, the gallbladder is moderately distended with some mild gallbladder wall thickening and edema, as well as trace amount of pericholecystic fluid. There is also some fluid adjacent to the head of the pancreas and descending portion of the duodenum, which could indicate an associated pancreatitis. 2. Aortic atherosclerosis. Electronically Signed   By: Vinnie Langton M.D.   On: 09/24/2018 11:50        Scheduled Meds:  dutasteride  0.5 mg Oral Daily   enoxaparin (LOVENOX) injection  40 mg Subcutaneous Q24H   escitalopram  20 mg Oral Daily   folic acid  1 mg Oral QPM   Continuous Infusions:  sodium chloride 10 mL/hr at 09/22/18 2300   piperacillin-tazobactam (ZOSYN)  IV 3.375 g (09/24/18 1429)     LOS: 2 days   The patient is critically ill with multiple organ systems failure and requires high complexity decision making for assessment and support, frequent evaluation and titration of therapies, application of advanced monitoring  technologies and extensive interpretation of multiple databases. Critical Care Time devoted to patient care services described in this note  Time spent: 40 minutes     Eran Windish, Geraldo Docker, MD [facility] Pager [phone]  If 7PM-7AM, please contact night-coverage [email or web address] Password [facility] 09/24/2018, 4:44 PM

## 2018-09-24 NOTE — Progress Notes (Signed)
Report called in to 5W RN . Family/Daughter called for transfer information. Also Updated with Covid19 result.

## 2018-09-25 ENCOUNTER — Inpatient Hospital Stay (HOSPITAL_COMMUNITY): Payer: Medicare Other | Admitting: Anesthesiology

## 2018-09-25 ENCOUNTER — Inpatient Hospital Stay (HOSPITAL_COMMUNITY): Payer: Medicare Other

## 2018-09-25 ENCOUNTER — Encounter (HOSPITAL_COMMUNITY): Payer: Self-pay | Admitting: Anesthesiology

## 2018-09-25 ENCOUNTER — Encounter (HOSPITAL_COMMUNITY)
Admission: EM | Disposition: A | Discharge status: Home / Self Care | Payer: Self-pay | Source: Home / Self Care | Attending: Internal Medicine

## 2018-09-25 HISTORY — PX: ERCP: SHX5425

## 2018-09-25 HISTORY — PX: SPHINCTEROTOMY: SHX5544

## 2018-09-25 HISTORY — PX: REMOVAL OF STONES: SHX5545

## 2018-09-25 LAB — CBC
HCT: 38.4 % — ABNORMAL LOW (ref 39.0–52.0)
Hemoglobin: 12.1 g/dL — ABNORMAL LOW (ref 13.0–17.0)
MCH: 28.8 pg (ref 26.0–34.0)
MCHC: 31.5 g/dL (ref 30.0–36.0)
MCV: 91.4 fL (ref 80.0–100.0)
Platelets: 121 10*3/uL — ABNORMAL LOW (ref 150–400)
RBC: 4.2 MIL/uL — ABNORMAL LOW (ref 4.22–5.81)
RDW: 12.7 % (ref 11.5–15.5)
WBC: 6.2 10*3/uL (ref 4.0–10.5)
nRBC: 0 % (ref 0.0–0.2)

## 2018-09-25 LAB — COMPREHENSIVE METABOLIC PANEL
ALT: 162 U/L — ABNORMAL HIGH (ref 0–44)
AST: 70 U/L — ABNORMAL HIGH (ref 15–41)
Albumin: 3.2 g/dL — ABNORMAL LOW (ref 3.5–5.0)
Alkaline Phosphatase: 183 U/L — ABNORMAL HIGH (ref 38–126)
Anion gap: 10 (ref 5–15)
BUN: 16 mg/dL (ref 8–23)
CO2: 27 mmol/L (ref 22–32)
Calcium: 9 mg/dL (ref 8.9–10.3)
Chloride: 103 mmol/L (ref 98–111)
Creatinine, Ser: 1.18 mg/dL (ref 0.61–1.24)
GFR calc Af Amer: >60 mL/min (ref >60)
GFR calc non Af Amer: 55 mL/min — ABNORMAL LOW (ref >60)
Glucose, Bld: 79 mg/dL (ref 70–99)
Potassium: 4.4 mmol/L (ref 3.5–5.1)
Sodium: 140 mmol/L (ref 135–145)
Total Bilirubin: 2.8 mg/dL — ABNORMAL HIGH (ref 0.3–1.2)
Total Protein: 6.6 g/dL (ref 6.5–8.1)

## 2018-09-25 SURGERY — ERCP, WITH INTERVENTION IF INDICATED
Anesthesia: General

## 2018-09-25 MED ORDER — CIPROFLOXACIN IN D5W 400 MG/200ML IV SOLN
INTRAVENOUS | Status: AC
  Filled 2018-09-25: qty 200

## 2018-09-25 MED ORDER — SODIUM CHLORIDE 0.9 % IV SOLN: INTRAVENOUS | Status: DC

## 2018-09-25 MED ORDER — DEXAMETHASONE SODIUM PHOSPHATE 10 MG/ML IJ SOLN
INTRAMUSCULAR | Status: DC | PRN
  Administered 2018-09-25: 10 mg via INTRAVENOUS

## 2018-09-25 MED ORDER — ROCURONIUM BROMIDE 50 MG/5ML IV SOSY
PREFILLED_SYRINGE | INTRAVENOUS | Status: DC | PRN
  Administered 2018-09-25: 40 mg via INTRAVENOUS

## 2018-09-25 MED ORDER — SUGAMMADEX SODIUM 200 MG/2ML IV SOLN
INTRAVENOUS | Status: DC | PRN
  Administered 2018-09-25: 400 mg via INTRAVENOUS

## 2018-09-25 MED ORDER — FENTANYL CITRATE (PF) 100 MCG/2ML IJ SOLN
INTRAMUSCULAR | Status: DC | PRN
  Administered 2018-09-25 (×2): 50 ug via INTRAVENOUS

## 2018-09-25 MED ORDER — SODIUM CHLORIDE 0.9 % IV SOLN
INTRAVENOUS | Status: DC
  Administered 2018-09-25: 50 mL via INTRAVENOUS

## 2018-09-25 MED ORDER — SUCCINYLCHOLINE CHLORIDE 20 MG/ML IJ SOLN
INTRAMUSCULAR | Status: DC | PRN
  Administered 2018-09-25: 100 mg via INTRAVENOUS

## 2018-09-25 MED ORDER — PANTOPRAZOLE SODIUM 40 MG PO TBEC
40.0000 mg | DELAYED_RELEASE_TABLET | Freq: Every day | ORAL | Status: DC
  Administered 2018-09-25 – 2018-09-26 (×2): 40 mg via ORAL
  Filled 2018-09-25 (×2): qty 1

## 2018-09-25 MED ORDER — LACTATED RINGERS IV SOLN
INTRAVENOUS | Status: DC | PRN
  Administered 2018-09-25: 12:00:00 via INTRAVENOUS

## 2018-09-25 MED ORDER — LIDOCAINE 2% (20 MG/ML) 5 ML SYRINGE
INTRAMUSCULAR | Status: DC | PRN
  Administered 2018-09-25: 100 mg via INTRAVENOUS

## 2018-09-25 MED ORDER — INDOMETHACIN 50 MG RE SUPP
RECTAL | Status: AC
  Filled 2018-09-25: qty 2

## 2018-09-25 MED ORDER — GLUCAGON HCL RDNA (DIAGNOSTIC) 1 MG IJ SOLR
INTRAMUSCULAR | Status: AC
  Filled 2018-09-25: qty 1

## 2018-09-25 MED ORDER — ONDANSETRON HCL 4 MG/2ML IJ SOLN
INTRAMUSCULAR | Status: DC | PRN
  Administered 2018-09-25: 4 mg via INTRAVENOUS

## 2018-09-25 MED ORDER — PROPOFOL 10 MG/ML IV BOLUS
INTRAVENOUS | Status: DC | PRN
  Administered 2018-09-25: 100 mg via INTRAVENOUS

## 2018-09-25 MED ORDER — SODIUM CHLORIDE 0.9 % IV SOLN
INTRAVENOUS | Status: DC | PRN
  Administered 2018-09-25: 50 ug/min via INTRAVENOUS

## 2018-09-25 MED ORDER — SODIUM CHLORIDE 0.9 % IV SOLN
INTRAVENOUS | Status: DC | PRN
  Administered 2018-09-25: 20 mL

## 2018-09-25 NOTE — Op Note (Signed)
[facility] Patient Name: [name] Procedure Date : 09/25/2018 MRN: [id number] Attending MD: Clarene Essex , MD Date of Birth: 11-04-1927 CSN: [id number] Age: 85 Admit Type: Inpatient Procedure:                ERCP Indications:              Bile duct stone(s) on MRCP with elevated liver                            tests and resolved abdominal pain nausea vomiting Providers:                Clarene Essex, MD, Vista Lawman, RN, Marguerita Merles,                            Technician, Charolette Child, Technician, Neldon Newport CRNA, CRNA Referring MD:              Medicines:                General Anesthesia Complications:            No immediate complications. Estimated Blood Loss:     Estimated blood loss was minimal. There was minimal                            oozing after pulling the balloon through the first                            time which stopped by the end of the procedure Procedure:                Pre-Anesthesia Assessment:                           - Prior to the procedure, a History and Physical                            was performed, and patient medications and                            allergies were reviewed. The patient's tolerance of                            previous anesthesia was also reviewed. The risks                            and benefits of the procedure and the sedation                            options and risks were discussed with the patient.                            All questions were answered, and informed consent  was obtained. Prior Anticoagulants: The patient has                            taken Lovenox (enoxaparin), last dose was 1 day                            prior to procedure. ASA Grade Assessment: III - A                            patient with severe systemic disease. After                            reviewing the risks and benefits, the patient was          deemed in satisfactory condition to undergo the                            procedure.                           After obtaining informed consent, the scope was                            passed under direct vision. Throughout the                            procedure, the patient's blood pressure, pulse, and                            oxygen saturations were monitored continuously. The                            TJF-Q180V ([id number] Olympus duodenoscope was                            introduced through the mouth, and used to inject                            contrast into and used to cannulate the bile duct.                            The ERCP was accomplished without difficulty. The                            patient tolerated the procedure well. Scope In: Scope Out: Findings:      The upper GI tract was traversed under direct vision without detailed       examination. Few non-bleeding cratered and superficial duodenal ulcers       with no stigmata of bleeding were found in the duodenal bulb and in the       first portion of the duodenum. The major papilla was normal. Although       there was some edema in the surrounding area and deep selective       cannulation was readily obtained and we proceeded with a biliary  sphincterotomy which was made with a Hydratome sphincterotome using ERBE       electrocautery. There was no post-sphincterotomy bleeding. There was no       pancreatic duct injection or wire advancement throughout the procedure       and the biliary tree was swept with an adjustable 9- 12 mm balloon       starting at the bifurcation. Initially a small stone fell out during       sphincterotomy and subsequently sludge was swept from the duct. We first       swept the duct with the 9 mm balloon and then switched to the 12 mm size       and all stones were removed. Both balloons passed readily through the       patent sphincterotomy site and nothing was found at the  end of the       procedure on occlusion cholangiogram. There was adequate biliary       drainage and the procedure was terminated at this point and the wire and       balloon and scope were removed Impression:               - Non-bleeding duodenal ulcers with no stigmata of                            bleeding.                           - The major papilla appeared normal. Although                            surrounding folds were a little edematous                           - Choledocholithiasis was found. Complete removal                            was accomplished by biliary sphincterotomy and                            balloon extraction.                           - A biliary sphincterotomy was performed.                           - The biliary tree was swept and nothing was found                            at the end of the procedure. And no pancreatic duct                            injection or wire advancement was done throughout                            the procedure Moderate Sedation:      moderate sedation-none Recommendation:           - Avoid aspirin and nonsteroidal anti-inflammatory  medicines for 1 month.                           - Continue present medications.                           - Return to GI clinic PRN.                           - Telephone GI clinic if symptomatic PRN.                           - Check liver enzymes (AST, ALT, alkaline                            phosphatase, bilirubin) in the morning.                           - Use Protonix (pantoprazole) 40 mg PO daily                            indefinitely. Can follow liver tests back to normal                            as an outpatient and consider a surgical                            consultation for cholecystectomy as well Procedure Code(s):        --- Professional ---                           [phone], Endoscopic retrograde                             cholangiopancreatography (ERCP); with removal of                            calculi/debris from biliary/pancreatic duct(s)                           43262, Endoscopic retrograde                            cholangiopancreatography (ERCP); with                            sphincterotomy/papillotomy Diagnosis Code(s):        --- Professional ---                           K26.9, Duodenal ulcer, unspecified as acute or                            chronic, without hemorrhage or perforation                           K80.50, Calculus of bile duct without  cholangitis                            or cholecystitis without obstruction CPT copyright 2019 [employer]. All rights reserved. The codes documented in this report are preliminary and upon coder review may  be revised to meet current compliance requirements. Clarene Essex, MD 09/25/2018 2:07:47 PM This report has been signed electronically. Number of Addenda: 0

## 2018-09-25 NOTE — Plan of Care (Signed)

## 2018-09-25 NOTE — Progress Notes (Signed)
[name] 1:09 PM  Subjective: Patient doing fine without any new complaints and case discussed with my partner Dr. Penelope Coop and hospital computer chart reviewed and the iPad translator was used Objective: Vital signs stable afebrile no acute distress exam please see preassessment evaluation LFTs decreased CBC okay MRCP reviewed previous CT and ultrasound reviewed  Assessment: Probable CBD stones  Plan: We rediscussed ERCP today and including the success rate and will proceed today with anesthesia assistance with further work-up and plans pending those findings  [facility]  Pager [phone] After 5PM or if no answer call [phone]

## 2018-09-25 NOTE — Anesthesia Procedure Notes (Signed)
Procedure Name: Intubation Date/Time: 09/25/2018 1:17 PM Performed by: Neldon Newport, CRNA Pre-anesthesia Checklist: Timeout performed, Patient being monitored, Suction available, Emergency Drugs available and Patient identified Patient Re-evaluated:Patient Re-evaluated prior to induction Oxygen Delivery Method: Circle system utilized Preoxygenation: Pre-oxygenation with 100% oxygen Induction Type: IV induction and Rapid sequence Laryngoscope Size: McGraph and 3 Grade View: Grade I Tube type: Oral Tube size: 7.5 mm Number of attempts: 1 Placement Confirmation: breath sounds checked- equal and bilateral,  positive ETCO2 and ETT inserted through vocal cords under direct vision Secured at: 22 cm Tube secured with: Tape Dental Injury: Teeth and Oropharynx as per pre-operative assessment

## 2018-09-25 NOTE — Progress Notes (Signed)
  Pharmacy Antibiotic Note  [name] is a 85 y.o. male admitted on 09/22/2018 with intra-abd infection.  Pharmacy has been consulted for Zosyn dosing. Noted pt on methotrexate PTA - currently holding since LFTs elevated.   Plan: Continue Zosyn 3.375gm IV q8h Will f/u micro data, pt's clinical condition, and renal function  Height: 5' 2.99" (160 cm) Weight: 164 lb 7.4 oz (74.6 kg) IBW/kg (Calculated) : 56.88  Temp (24hrs), Avg:98.6 F (37 C), Min:98 F (36.7 C), Max:99.1 F (37.3 C)  Recent Labs  Lab 09/22/18 1044 09/22/18 1502 09/22/18 1821 09/23/18 0537 09/24/18 0640 09/25/18 0229  WBC 7.3  --   --  6.8 8.1 6.2  CREATININE 1.04  --   --  1.46* 1.28* 1.18  LATICACIDVEN  --  1.6 1.1  --   --   --     Estimated Creatinine Clearance: 39.2 mL/min (by C-G formula based on SCr of 1.18 mg/dL).    Allergies  Allergen Reactions  . Ativan [Lorazepam] Other (See Comments)    hallucinations  . Phenergan [Promethazine Hcl] Hypertension  . Eggs Or Egg-Derived Products Rash    Small rash after eating for several days in a row   Antimicrobials this admission: 4/11 Zosyn >>   Microbiology results: 4/11 BCx: NGTD 4/11 Covid: negative  [name] A. Levada Dy, PharmD, [place] Please utilize Amion for appropriate phone number to reach the unit pharmacist ([place])   09/25/2018 9:13 AM

## 2018-09-25 NOTE — Anesthesia Preprocedure Evaluation (Addendum)
Anesthesia Evaluation  Patient identified by MRN, date of birth, ID band Patient awake    Reviewed: Allergy & Precautions, NPO status , Patient's Chart, lab work & pertinent test results  Airway Mallampati: II  TM Distance: >3 FB Neck ROM: Full    Dental no notable dental hx. (+) Dental Advidsory Given, Missing   Pulmonary neg pulmonary ROS, former smoker,    Pulmonary exam normal breath sounds clear to auscultation       Cardiovascular hypertension, +CHF   Rhythm:Regular Rate:Normal + Systolic murmurs S/P TAVR   Neuro/Psych negative neurological ROS  negative psych ROS   GI/Hepatic Neg liver ROS, GERD  ,  Endo/Other  negative endocrine ROS  Renal/GU negative Renal ROS  negative genitourinary   Musculoskeletal  (+) Arthritis , Rheumatoid disorders,    Abdominal   Peds negative pediatric ROS (+)  Hematology negative hematology ROS (+)   Anesthesia Other Findings   Reproductive/Obstetrics negative OB ROS                            Anesthesia Physical Anesthesia Plan  ASA: III  Anesthesia Plan: General   Post-op Pain Management:    Induction: Intravenous and Rapid sequence  PONV Risk Score and Plan: 2 and Ondansetron, Dexamethasone and Treatment may vary due to age or medical condition  Airway Management Planned: Oral ETT  Additional Equipment:   Intra-op Plan:   Post-operative Plan: Extubation in OR  Informed Consent: I have reviewed the patients History and Physical, chart, labs and discussed the procedure including the risks, benefits and alternatives for the proposed anesthesia with the patient or authorized representative who has indicated his/her understanding and acceptance.     Dental advisory given and Dental Advisory Given  Plan Discussed with: CRNA and Surgeon  Anesthesia Plan Comments:        Anesthesia Quick Evaluation

## 2018-09-25 NOTE — Transfer of Care (Signed)
Immediate Anesthesia Transfer of Care Note  Patient: [name]  Procedure(s) Performed: ENDOSCOPIC RETROGRADE CHOLANGIOPANCREATOGRAPHY (ERCP) (N/A ) SPHINCTEROTOMY REMOVAL OF STONES  Patient Location: PACU  Anesthesia Type:General  Level of Consciousness: awake, alert  and oriented  Airway & Oxygen Therapy: Patient Spontanous Breathing and Patient connected to face mask oxygen  Post-op Assessment: Report given to RN and Post -op Vital signs reviewed and stable  Post vital signs: Reviewed and stable  Last Vitals:  Vitals Value Taken Time  BP 171/56 09/25/2018  2:12 PM  Temp    Pulse 60 09/25/2018  2:14 PM  Resp 16 09/25/2018  2:14 PM  SpO2 100 % 09/25/2018  2:14 PM  Vitals shown include unvalidated device data.  Last Pain:  Vitals:   09/25/18 1241  TempSrc: Oral  PainSc: 0-No pain      Patients Stated Pain Goal: 0 (38/18/29 9371)  Complications: No apparent anesthesia complications

## 2018-09-25 NOTE — Progress Notes (Signed)
PROGRESS NOTE    [name]  TIR:[id number] DOB: January 18, 1928 DOA: 09/22/2018 PCP: Lajean Manes, MD   Brief Narrative:  85 y.o. WM RUSSIAN speaking PMHx Essential HTN, chronic diastolic CHF, severe aortic stenosis status post TAVR, BPH, GERD rheumatoid arthritis on methotrexate   Presented to the emergency department complaining of chest pain/shortness of breath with nausea and vomiting.  Patient is Russian-speaking and history was obtained via daughter through phone call who is a physician.  Apparently patient developed slight shortness of breath yesterday along with nausea and vomiting.  Thought to have biliary sludge and cholelithiasis as well as choledocholithiasis.    Subjective: (through translation service spoke with patient) denies chest pain/abdominal pain  Assessment & Plan:   Active Problems:   Sinus bradycardia   Benign essential HTN   Severe aortic stenosis   S/P TAVR (transcatheter aortic valve replacement)   RA (rheumatoid arthritis) (HCC)   HLD (hyperlipidemia)   Chronic diastolic CHF (congestive heart failure) (HCC)   Elevated LFTs   AKI (acute kidney injury) ([place])  Cholelithiasis/Choledocholithiasis - MRCP confirms sludge with stones and biliary tract obstruction along with distended gallbladder. - Per GI note plan is for ERCP with sphincterotomy and stone extraction on 4/14 - Once GI has completed ERCP surgery will get involved for cholecystectomy. --Currently patient not experiencing any pain - NPO -liver enzymes trending down  Cholangitis - No evidence of cholangitis however, given that patient will undergo 2 surgical procedures will continue with his current antibiotics.  Chronic diastolic CHF - Appears euvolemic currently - Strict in and out -Daily weight  Essential HTN -Was on Lasix and losartan at home will hold   AKI - March 2020 creatinine WNL - trending down -IVF while NPO  Rule out COVID-19 -  COVID-19 negative -Patient's  presenting SOB and fever most likely secondary to his cholelithiasis/choledocholithiasis.  Currently asymptomatic      DVT prophylaxis: Lovenox Code Status: DNR Family Communication: family can be updated after ERCP Disposition Plan: Per GI and CCS   Consultants:  [facility]    Procedures/Significant Events:  4/13 MRCP abdomen:-POSITIVE  biliary sludge and cholelithiasis as well as choledocholithiasis. At this time, there is no evidence of frank biliary tract obstruction despite the presence of common bile duct stones measuring up to 5 mm in the periampullary region.  -Gallbladder is moderately distended with some mild gallbladder wall thickening and edema, as well as trace amount of pericholecystic fluid.  -fluid adjacent to the head of the pancreas and descending portion of the duodenum, which could indicate an associated pancreatitis.     Cultures 4/11 COVID-19 negative 4/12 respiratory virus panel negative 4/14 ERCP    Antimicrobials: Anti-infectives (From admission, onward)   Start     Stop   09/22/18 2200  piperacillin-tazobactam (ZOSYN) IVPB 3.375 g         09/22/18 1515  piperacillin-tazobactam (ZOSYN) IVPB 3.375 g     09/22/18 1635         Continuous Infusions:  sodium chloride 10 mL/hr at 09/22/18 2300   sodium chloride     piperacillin-tazobactam (ZOSYN)  IV 3.375 g (09/25/18 0658)     Objective: Vitals:   09/24/18 0501 09/24/18 1231 09/24/18 2224 09/25/18 0502  BP: (!) 142/68 (!) 138/58 (!) 139/37 (!) 162/59  Pulse: 67 (!) 57 (!) 59 (!) 55  Resp:  20 14 16   Temp: 98 F (36.7 C) 98 F (36.7 C) 99.1 F (37.3 C) 98.8 F (37.1 C)  TempSrc: Oral Oral  Oral Oral  SpO2: 96% 94% 97% 95%  Weight:  76.7 kg  74.6 kg  Height:        Intake/Output Summary (Last 24 hours) at 09/25/2018 0950 Last data filed at 09/25/2018 0700 Gross per 24 hour  Intake 0 ml  Output 600 ml  Net -600 ml   Filed Weights   09/23/18 0420 09/24/18 1231 09/25/18 0502   Weight: 77.9 kg 76.7 kg 74.6 kg    Examination:  Spoke with patient through video interpreter A+Ox3 Pleasant and cooperative rrr +BS, soft, min tenderness No LE edema Up walking in room on own  .     Data Reviewed: Care during the described time interval was provided by me .  I have reviewed this patient's available data, including medical history, events of note, physical examination, and all test results as part of my evaluation.   CBC: Recent Labs  Lab 09/22/18 1044 09/23/18 0537 09/24/18 0640 09/25/18 0229  WBC 7.3 6.8 8.1 6.2  NEUTROABS 5.7  --  6.1  --   HGB 13.0 12.2* 12.0* 12.1*  HCT 40.0 38.5* 36.4* 38.4*  MCV 92.0 90.6 90.8 91.4  PLT PLATELET CLUMPS NOTED ON SMEAR, COUNT APPEARS DECREASED 100* 107* 193*   Basic Metabolic Panel: Recent Labs  Lab 09/22/18 1044 09/23/18 0537 09/24/18 0640 09/25/18 0229  NA 137 138 139 140  K 3.9 3.9 4.0 4.4  CL 102 102 103 103  CO2 23 25 25 27   GLUCOSE 157* 133* 120* 79  BUN 11 17 17 16   CREATININE 1.04 1.46* 1.28* 1.18  CALCIUM 9.0 8.5* 8.7* 9.0  MG  --   --  2.0  --    GFR: Estimated Creatinine Clearance: 39.2 mL/min (by C-G formula based on SCr of 1.18 mg/dL). Liver Function Tests: Recent Labs  Lab 09/22/18 1044 09/23/18 0537 09/24/18 0640 09/25/18 0229  AST 542* 226* 110* 70*  ALT 496* 334* 217* 162*  ALKPHOS 152* 179* 192* 183*  BILITOT 4.3* 7.3* 3.3* 2.8*  PROT 7.0 6.5 6.4* 6.6  ALBUMIN 3.9 3.4* 3.1* 3.2*   Recent Labs  Lab 09/22/18 1044  LIPASE 41   No results for input(s): AMMONIA in the last 168 hours. Coagulation Profile: Recent Labs  Lab 09/23/18 0537  INR 1.3*   Cardiac Enzymes: Recent Labs  Lab 09/22/18 1044 09/22/18 1339  TROPONINI <0.03 <0.03   BNP (last 3 results) No results for input(s): PROBNP in the last 8760 hours. HbA1C: No results for input(s): HGBA1C in the last 72 hours. CBG: No results for input(s): GLUCAP in the last 168 hours. Lipid Profile: No results for  input(s): CHOL, HDL, LDLCALC, TRIG, CHOLHDL, LDLDIRECT in the last 72 hours. Thyroid Function Tests: No results for input(s): TSH, T4TOTAL, FREET4, T3FREE, THYROIDAB in the last 72 hours. Anemia Panel: No results for input(s): VITAMINB12, FOLATE, FERRITIN, TIBC, IRON, RETICCTPCT in the last 72 hours. Urine analysis:    Component Value Date/Time   COLORURINE AMBER (A) 09/22/2018 1442   APPEARANCEUR CLEAR 09/22/2018 1442   LABSPEC 1.017 09/22/2018 1442   PHURINE 7.0 09/22/2018 1442   GLUCOSEU NEGATIVE 09/22/2018 1442   HGBUR NEGATIVE 09/22/2018 1442   BILIRUBINUR SMALL (A) 09/22/2018 1442   KETONESUR NEGATIVE 09/22/2018 1442   PROTEINUR NEGATIVE 09/22/2018 1442   UROBILINOGEN 1.0 04/14/2014 0116   NITRITE NEGATIVE 09/22/2018 1442   LEUKOCYTESUR NEGATIVE 09/22/2018 1442     ) Recent Results (from the past 240 hour(s))  Blood culture (routine x 2)  Status: None (Preliminary result)   Collection Time: 09/22/18  3:09 PM  Result Value Ref Range Status   Specimen Description BLOOD SITE NOT SPECIFIED  Final   Special Requests   Final    BOTTLES DRAWN AEROBIC AND ANAEROBIC Blood Culture results may not be optimal due to an inadequate volume of blood received in culture bottles   Culture   Final    NO GROWTH 1 DAY Performed at [facility], [place] [address], [place], [place] [address]    Report Status PENDING  Incomplete  Blood culture (routine x 2)     Status: None (Preliminary result)   Collection Time: 09/22/18  3:14 PM  Result Value Ref Range Status   Specimen Description BLOOD RIGHT ANTECUBITAL  Final   Special Requests   Final    BOTTLES DRAWN AEROBIC AND ANAEROBIC Blood Culture results may not be optimal due to an excessive volume of blood received in culture bottles   Culture   Final    NO GROWTH 1 DAY Performed at [facility], [place] [address], [place], [place] [address]    Report Status PENDING  Incomplete  Novel Coronavirus, NAA (hospital order; send-out to  ref lab)     Status: None   Collection Time: 09/22/18 11:19 PM  Result Value Ref Range Status   SARS-CoV-2, NAA NOT DETECTED NOT DETECTED Final    Comment: Negative (Not Detected) results do not exclude infection caused by SARS CoV 2 and should not be used as the sole basis for treatment or other patient management decisions. Optimum specimen types and timing for peak viral levels during infections caused  by SARS CoV 2 have not been determined. Collection of multiple specimens (types and time points) from the same patient may be necessary to detect the virus. Improper specimen collection and handling, sequence variability underlying assay primers and or probes, or the presence of organisms in  quantities less than the limit of detection of the assay may lead to false negative results. Positive and negative predictive values of testing are highly dependent on prevalence. False negative results are more likely when prevalence of disease is high. Performed at [facility] (NOTE) The expected result is Negative (Not Detected). The SARS CoV 2 test is intended for the presumptive qualitative  detection of nucleic acid from SARS C oV 2 in upper and lower  respiratory specimens. Testing methodology is real time RT PCR. Test results must be correlated with clinical presentation and  evaluated in the context of other laboratory and epidemiologic data.  Test performance can be affected because the epidemiology and  clinical spectrum of infection caused by SARS CoV 2 is not fully  known. For example, the optimum types of specimens to collect and  when during the course of infection these specimens are most likely  to contain detectable viral RNA may not be known. This test has not been Food and Drug Administration (FDA) cleared or  approved and has been authorized by FDA under an Emergency Use  Authorization (EUA). The test is only authorized for the duration of  the declaration that  circumstances exist justifying the authorization  of emergency use of in vitro diagnostic tests for detection and or  diagnosis of SARS CoV 2 under Section 564(b)(1) of the Act, 21 U.S.C.  section [phone] 3(b)(1), unless th e authorization is terminated or  revoked sooner. [facility] is certified under the  Clinical Laboratory Improvement Amendments of 1988 (CLIA), 42 U.S.C.  section M1613687, to perform high complexity tests. Performed at [facility] [id number], [place], [place] [address] Laboratory Director: Loleta Books, MD Fact Sheet for Healthcare Providers  BankingDealers.co.za Fact Sheet for Patients  StrictlyIdeas.no    Coronavirus Source NASOPHARYNGEAL  Final    Comment: Performed at [facility], [address], [place], [place] [address]  Respiratory Panel by PCR     Status: None   Collection Time: 09/23/18 11:12 AM  Result Value Ref Range Status   Adenovirus NOT DETECTED NOT DETECTED Final   Coronavirus 229E NOT DETECTED NOT DETECTED Final    Comment: (NOTE) The Coronavirus on the Respiratory Panel, DOES NOT test for the novel  Coronavirus (2019 nCoV)    Coronavirus HKU1 NOT DETECTED NOT DETECTED Final   Coronavirus NL63 NOT DETECTED NOT DETECTED Final   Coronavirus OC43 NOT DETECTED NOT DETECTED Final   Metapneumovirus NOT DETECTED NOT DETECTED Final   Rhinovirus / Enterovirus NOT DETECTED NOT DETECTED Final   Influenza A NOT DETECTED NOT DETECTED Final   Influenza B NOT DETECTED NOT DETECTED Final   Parainfluenza Virus 1 NOT DETECTED NOT DETECTED Final   Parainfluenza Virus 2 NOT DETECTED NOT DETECTED Final   Parainfluenza Virus 3 NOT DETECTED NOT DETECTED Final   Parainfluenza Virus 4 NOT DETECTED NOT DETECTED Final   Respiratory Syncytial Virus NOT DETECTED NOT DETECTED Final   Bordetella pertussis NOT DETECTED NOT DETECTED Final    Chlamydophila pneumoniae NOT DETECTED NOT DETECTED Final   Mycoplasma pneumoniae NOT DETECTED NOT DETECTED Final    Comment: Performed at [facility], [place] [place], [place] [address]         Radiology Studies: Mr 3d Recon At Scanner  Result Date: 09/24/2018 CLINICAL DATA:  85 year old male with history of common bile duct stone and biliary obstruction. EXAM: MRI ABDOMEN WITHOUT AND WITH CONTRAST (INCLUDING MRCP) TECHNIQUE: Multiplanar multisequence MR imaging of the abdomen was performed both before and after the administration of intravenous contrast. Heavily T2-weighted images of the biliary and pancreatic ducts were obtained, and three-dimensional MRCP images were rendered by post processing. CONTRAST:  8 mL of Gadavist. COMPARISON:  No priors. Abdominal ultrasound 09/22/2018. CT the abdomen and pelvis 02/07/2018. FINDINGS: Comment: Today's study is slightly limited by patient respiratory motion. Lower chest: Unremarkable. Hepatobiliary: No suspicious cystic or solid hepatic lesions. No intrahepatic biliary ductal dilatation. Common bile duct is borderline dilated measuring 7 mm in the porta hepatis (within normal limits for the patient's age). Several small filling defects are noted in the common bile duct, largest of which measures up to 5 mm distally shortly before the ampulla. In addition, amorphous filling defects are noted in the gallbladder, indicative of a combination of biliary sludge and small gallstones. Gallbladder is moderately distended, with some mild gallbladder wall thickening (6 mm) and increased T2 signal intensity in the wall, indicative of edema. Trace volume of T2 signal intensity adjacent to the gallbladder, indicative of pericholecystic fluid. This is contiguous with increased T2 signal intensity adjacent to the descending portion of the duodenum. Pancreas: No pancreatic mass. Small amount of increased T2 signal intensity adjacent to the head of the pancreas  and the adjacent descending duodenum. No pancreatic ductal dilatation noted on MRCP images. No peripancreatic fluid collections. Spleen:  Unremarkable. Adrenals/Urinary Tract: Multiple T1 hypointense, T2 hyperintense, nonenhancing lesions in both kidneys, compatible with simple cysts, largest of which is exophytic in the anterior aspect  of the interpolar region of the right kidney measuring 3.6 cm in diameter. No definite suspicious renal lesions. No hydroureteronephrosis in the visualized portions of the abdomen. Bilateral adrenal glands are normal in appearance. Stomach/Bowel: Normal appearance of the stomach. Increased T2 signal intensity surrounding the descending portion of the duodenum. Remaining portions of visualized small bowel and colon are unremarkable. Vascular/Lymphatic: Aortic atherosclerosis, without evidence of aneurysm in the abdominal vasculature. No lymphadenopathy noted in the abdomen. Other: No significant volume of ascites in the visualized portions of the peritoneal cavity. Musculoskeletal: No aggressive appearing osseous lesions are noted in the visualized portions of the skeleton. IMPRESSION: 1. Study is positive for biliary sludge and cholelithiasis as well as choledocholithiasis. At this time, there is no evidence of frank biliary tract obstruction despite the presence of common bile duct stones measuring up to 5 mm in the periampullary region. Notably, the gallbladder is moderately distended with some mild gallbladder wall thickening and edema, as well as trace amount of pericholecystic fluid. There is also some fluid adjacent to the head of the pancreas and descending portion of the duodenum, which could indicate an associated pancreatitis. 2. Aortic atherosclerosis. Electronically Signed   By: Vinnie Langton M.D.   On: 09/24/2018 11:50   Mr Abdomen Mrcp Moise Boring Contast  Result Date: 09/24/2018 CLINICAL DATA:  85 year old male with history of common bile duct stone and biliary  obstruction. EXAM: MRI ABDOMEN WITHOUT AND WITH CONTRAST (INCLUDING MRCP) TECHNIQUE: Multiplanar multisequence MR imaging of the abdomen was performed both before and after the administration of intravenous contrast. Heavily T2-weighted images of the biliary and pancreatic ducts were obtained, and three-dimensional MRCP images were rendered by post processing. CONTRAST:  8 mL of Gadavist. COMPARISON:  No priors. Abdominal ultrasound 09/22/2018. CT the abdomen and pelvis 02/07/2018. FINDINGS: Comment: Today's study is slightly limited by patient respiratory motion. Lower chest: Unremarkable. Hepatobiliary: No suspicious cystic or solid hepatic lesions. No intrahepatic biliary ductal dilatation. Common bile duct is borderline dilated measuring 7 mm in the porta hepatis (within normal limits for the patient's age). Several small filling defects are noted in the common bile duct, largest of which measures up to 5 mm distally shortly before the ampulla. In addition, amorphous filling defects are noted in the gallbladder, indicative of a combination of biliary sludge and small gallstones. Gallbladder is moderately distended, with some mild gallbladder wall thickening (6 mm) and increased T2 signal intensity in the wall, indicative of edema. Trace volume of T2 signal intensity adjacent to the gallbladder, indicative of pericholecystic fluid. This is contiguous with increased T2 signal intensity adjacent to the descending portion of the duodenum. Pancreas: No pancreatic mass. Small amount of increased T2 signal intensity adjacent to the head of the pancreas and the adjacent descending duodenum. No pancreatic ductal dilatation noted on MRCP images. No peripancreatic fluid collections. Spleen:  Unremarkable. Adrenals/Urinary Tract: Multiple T1 hypointense, T2 hyperintense, nonenhancing lesions in both kidneys, compatible with simple cysts, largest of which is exophytic in the anterior aspect of the interpolar region of the  right kidney measuring 3.6 cm in diameter. No definite suspicious renal lesions. No hydroureteronephrosis in the visualized portions of the abdomen. Bilateral adrenal glands are normal in appearance. Stomach/Bowel: Normal appearance of the stomach. Increased T2 signal intensity surrounding the descending portion of the duodenum. Remaining portions of visualized small bowel and colon are unremarkable. Vascular/Lymphatic: Aortic atherosclerosis, without evidence of aneurysm in the abdominal vasculature. No lymphadenopathy noted in the abdomen. Other: No significant volume of ascites  in the visualized portions of the peritoneal cavity. Musculoskeletal: No aggressive appearing osseous lesions are noted in the visualized portions of the skeleton. IMPRESSION: 1. Study is positive for biliary sludge and cholelithiasis as well as choledocholithiasis. At this time, there is no evidence of frank biliary tract obstruction despite the presence of common bile duct stones measuring up to 5 mm in the periampullary region. Notably, the gallbladder is moderately distended with some mild gallbladder wall thickening and edema, as well as trace amount of pericholecystic fluid. There is also some fluid adjacent to the head of the pancreas and descending portion of the duodenum, which could indicate an associated pancreatitis. 2. Aortic atherosclerosis. Electronically Signed   By: Vinnie Langton M.D.   On: 09/24/2018 11:50        Scheduled Meds:  dutasteride  0.5 mg Oral Daily   enoxaparin (LOVENOX) injection  40 mg Subcutaneous Q24H   escitalopram  20 mg Oral Daily   folic acid  1 mg Oral QPM   Continuous Infusions:  sodium chloride 10 mL/hr at 09/22/18 2300   sodium chloride     piperacillin-tazobactam (ZOSYN)  IV 3.375 g (09/25/18 0658)     LOS: 3 days      Geradine Girt, DO [facility]   If 7PM-7AM, please contact night-coverage [email or web address] Password [facility] 09/25/2018, 9:50 AM

## 2018-09-26 LAB — CBC WITH DIFFERENTIAL/PLATELET
Abs Immature Granulocytes: 0 10*3/uL (ref 0.00–0.07)
Basophils Absolute: 0 10*3/uL (ref 0.0–0.1)
Basophils Relative: 0 %
Eosinophils Absolute: 0 10*3/uL (ref 0.0–0.5)
Eosinophils Relative: 0 %
HCT: 33.5 % — ABNORMAL LOW (ref 39.0–52.0)
Hemoglobin: 11.8 g/dL — ABNORMAL LOW (ref 13.0–17.0)
Lymphocytes Relative: 9 %
Lymphs Abs: 0.7 10*3/uL (ref 0.7–4.0)
MCH: 30.5 pg (ref 26.0–34.0)
MCHC: 35.2 g/dL (ref 30.0–36.0)
MCV: 86.6 fL (ref 80.0–100.0)
Monocytes Absolute: 0.4 10*3/uL (ref 0.1–1.0)
Monocytes Relative: 6 %
Neutro Abs: 6.3 10*3/uL (ref 1.7–7.7)
Neutrophils Relative %: 85 %
Platelets: 122 10*3/uL — ABNORMAL LOW (ref 150–400)
RBC: 3.87 MIL/uL — ABNORMAL LOW (ref 4.22–5.81)
RDW: 12.5 % (ref 11.5–15.5)
WBC: 7.4 10*3/uL (ref 4.0–10.5)
nRBC: 0 % (ref 0.0–0.2)
nRBC: 2 /100 WBC — ABNORMAL HIGH

## 2018-09-26 LAB — COMPREHENSIVE METABOLIC PANEL
ALT: 121 U/L — ABNORMAL HIGH (ref 0–44)
AST: 69 U/L — ABNORMAL HIGH (ref 15–41)
Albumin: 3 g/dL — ABNORMAL LOW (ref 3.5–5.0)
Alkaline Phosphatase: 181 U/L — ABNORMAL HIGH (ref 38–126)
Anion gap: 11 (ref 5–15)
BUN: 16 mg/dL (ref 8–23)
CO2: 21 mmol/L — ABNORMAL LOW (ref 22–32)
Calcium: 8.8 mg/dL — ABNORMAL LOW (ref 8.9–10.3)
Chloride: 104 mmol/L (ref 98–111)
Creatinine, Ser: 1.13 mg/dL (ref 0.61–1.24)
GFR calc Af Amer: >60 mL/min (ref >60)
GFR calc non Af Amer: 58 mL/min — ABNORMAL LOW (ref >60)
Glucose, Bld: 149 mg/dL — ABNORMAL HIGH (ref 70–99)
Potassium: 4.9 mmol/L (ref 3.5–5.1)
Sodium: 136 mmol/L (ref 135–145)
Total Bilirubin: 2.3 mg/dL — ABNORMAL HIGH (ref 0.3–1.2)
Total Protein: 6.1 g/dL — ABNORMAL LOW (ref 6.5–8.1)

## 2018-09-26 MED ORDER — PANTOPRAZOLE SODIUM 40 MG PO TBEC: 40.0000 mg | DELAYED_RELEASE_TABLET | Freq: Every day | ORAL | 0 refills | Status: AC

## 2018-09-26 MED ORDER — AMOXICILLIN-POT CLAVULANATE 875-125 MG PO TABS: 1.0000 | ORAL_TABLET | Freq: Two times a day (BID) | ORAL | 0 refills | Status: DC

## 2018-09-26 MED ORDER — SENNA 8.6 MG PO TABS
1.0000 | ORAL_TABLET | Freq: Every day | ORAL | Status: DC | PRN
  Administered 2018-09-26: 8.6 mg via ORAL
  Filled 2018-09-26: qty 1

## 2018-09-26 MED ORDER — AMOXICILLIN-POT CLAVULANATE 875-125 MG PO TABS: 1.0000 | ORAL_TABLET | Freq: Two times a day (BID) | ORAL | Status: DC

## 2018-09-26 NOTE — Consult Note (Signed)
[facility] Surgery Consult Note  [name] 1928-06-11  [id number].    Requesting MD: Anson Fret Chief Complaint/Reason for Consult: choledocholithiasis  HPI:  [name] is an 85yo male PMH HTN, BPH, GERD, rheumatoid arthritis, diastolic CHF (EF 16-07% 37/10/62), aortic stenosis s/p TAVR 03/27/2018, who was admitted to [facility] 4/11 complaining of acute onset shortness of breath, nausea, vomiting, and chest/epigastric pain. States that he had never had pain like this before. He was found to have elevated LFTs with AST 542, ALT 496, AP 152, and Tbili 4.3. MRCP revealed choledocholithiasis. GI was consulted and performed ERCP 4/14 with complete removal of stones with biliary sphincterotomy and balloon extraction.  General surgery asked to see for consideration of cholecystectomy. Patient states that he feels well today. Denies abdominal pain, nausea, vomiting. Tolerated breakfast with any issues.  Translator used for history taking and during exam.  Abdominal surgical history: right inguinal hernia repair  Blood thinning medications: ASA81mg  daily Nonsmoker Drinks 1 glass wine weekly Lives home independently, has a Marine scientist and daughters that check on his daily [place] Does not use assistive device for ambulation  ROS: Review of Systems  Constitutional: Negative.  Negative for chills and fever.  HENT: Negative.   Eyes: Negative.   Respiratory: Negative.   Cardiovascular: Negative.   Gastrointestinal: Negative.  Negative for abdominal pain, nausea and vomiting.  Genitourinary: Negative.   Musculoskeletal: Negative.   Skin: Negative.   Neurological: Negative.    All systems reviewed and otherwise negative except for as above  Family History  Problem Relation Age of Onset  . Pulmonary embolism Mother 47       caused by complications of surgery  . CVA Father   . Diabetes Brother   . Diabetes Brother   . Breast cancer Sister     Past Medical History:  Diagnosis Date   . BPH (benign prostatic hypertrophy)   . Depression   . Diverticulosis of colon   . GERD (gastroesophageal reflux disease)   . Gilbert's syndrome   . History of kidney stones   . HLD (hyperlipidemia)   . HOH (hard of hearing)    refuses to wear his Hearing Aid  . Hypertension   . Inguinal hernia    right  . Internal hemorrhoid   . Normal coronary arteries    a. Cath 07/2010: normal coronaries. Felt to have noncardiac CP/SOB at that time possibly r/t anxiety.  . RA (rheumatoid arthritis) ([place])    bilateral hands/ wrist--  seronegative  . S/P TAVR (transcatheter aortic valve replacement) 03/27/2018   23 mm Edwards Sapien 3 transcatheter heart valve placed via percutaneous right transfemoral approach   . Severe aortic stenosis   . Thyroid nodule    noted 11-2009  . Wears dentures     Past Surgical History:  Procedure Laterality Date  . CARDIAC CATHETERIZATION  07-19-2010  dr Tressia Miners turner   normal coronary arteries,  ef 60%,  moderate aortic stenosis- gradient 32mmHg, normal right heart pressure  . CARDIOVASCULAR STRESS TEST  05/ 2011   dr Marlou Porch   normal low risk perfusion study  . CATARACT EXTRACTION W/ INTRAOCULAR LENS  IMPLANT, BILATERAL  2013  . CATARACT EXTRACTION, BILATERAL    . COLONOSCOPY  2010  approx  . INGUINAL HERNIA REPAIR Right 08/23/2018   Procedure: OPEN RIGHT INGUINAL HERNIA REPAIR WITH MESH;  Surgeon: Armandina Gemma, MD;  Location: [facility];  Service: General;  Laterality: Right;  . INTRAOPERATIVE TRANSTHORACIC ECHOCARDIOGRAM N/A 03/27/2018   Procedure: INTRAOPERATIVE  TRANSTHORACIC ECHOCARDIOGRAM;  Surgeon: Burnell Blanks, MD;  Location: [place];  Service: Open Heart Surgery;  Laterality: N/A;  . RIGHT/LEFT HEART CATH AND CORONARY ANGIOGRAPHY N/A 02/06/2018   Procedure: RIGHT/LEFT HEART CATH AND CORONARY ANGIOGRAPHY;  Surgeon: Burnell Blanks, MD;  Location: [facility];  Service: Cardiovascular;  Laterality: N/A;  . THYROID LOBECTOMY Right  05/05/2015   Procedure: RIGHT THYROID LOBECTOMY;  Surgeon: Armandina Gemma, MD;  Location: [place];  Service: General;  Laterality: Right;  . TRANSANAL HEMORRHOIDAL DEARTERIALIZATION N/A 04/09/2014   Procedure: TRANSANAL HEMORRHOIDAL DEARTERIALIZATION OF INTERNAL HEMORROIDS;  Surgeon: Leighton Ruff, MD;  Location: [facility];  Service: General;  Laterality: N/A;  . TRANSCATHETER AORTIC VALVE REPLACEMENT, TRANSFEMORAL  03/27/2018  . TRANSCATHETER AORTIC VALVE REPLACEMENT, TRANSFEMORAL N/A 03/27/2018   Procedure: TRANSCATHETER AORTIC VALVE REPLACEMENT, TRANSFEMORAL. Edwards SAPIEN 3 Transcatheter Heart Valve 78mm.;  Surgeon: Burnell Blanks, MD;  Location: [place];  Service: Open Heart Surgery;  Laterality: N/A;  . TRANSTHORACIC ECHOCARDIOGRAM  11-26-2013   moderate focal basal and mild LVH/  ef 51-76%/  grade I diastolic dysfunction/ mild LAE/  moderate calcification with stenosis AV with mild regurg,  gradients 35 abd 58 mmHg /  mild TR    Social History:  reports that he quit smoking about 37 years ago. His smoking use included cigarettes. He started smoking about 45 years ago. He quit after 40.00 years of use. He has never used smokeless tobacco. He reports current alcohol use. He reports that he does not use drugs.  Allergies:  Allergies  Allergen Reactions  . Ativan [Lorazepam] Other (See Comments)    hallucinations  . Phenergan [Promethazine Hcl] Hypertension  . Eggs Or Egg-Derived Products Rash    Small rash after eating for several days in a row    Medications Prior to Admission  Medication Sig Dispense Refill  . Ascorbic Acid (VITAMIN C) 1000 MG tablet Take 1,000 mg by mouth daily after lunch.    Marland Kitchen aspirin 81 MG tablet Take 1 tablet (81 mg total) by mouth daily. (Patient taking differently: Take 81 mg by mouth daily. ) 30 tablet   . cholecalciferol (VITAMIN D3) 25 MCG (1000 UT) tablet Take 1,000 Units by mouth daily.    Marland Kitchen dutasteride (AVODART) 0.5 MG capsule Take  0.5 mg by mouth daily.    Marland Kitchen escitalopram (LEXAPRO) 20 MG tablet Take 1 tablet (20 mg total) by mouth daily. (Patient taking differently: Take 20 mg by mouth at bedtime. ) 30 tablet 4  . folic acid (FOLVITE) 1 MG tablet Take 1 mg by mouth every evening.   0  . furosemide (LASIX) 20 MG tablet Take 20 mg by mouth.    Marland Kitchen ibuprofen (ADVIL,MOTRIN) 800 MG tablet Take 800 mg by mouth every morning.    Marland Kitchen losartan (COZAAR) 25 MG tablet Take 25 mg by mouth daily.   3  . Melatonin 5 MG TABS Take 5 mg by mouth at bedtime as needed (for sleep).     . methotrexate (RHEUMATREX) 2.5 MG tablet Take 22.5 mg by mouth every Sunday.     . Omega-3 Fatty Acids (FISH OIL) 1000 MG CAPS Take 1,000 mg by mouth daily.    . simvastatin (ZOCOR) 20 MG tablet TAKE 1 TABLET BY MOUTH EVERY NIGHT AT BEDTIME (Patient taking differently: Take 20 mg by mouth every evening. ) 30 tablet 0  . traMADol (ULTRAM) 50 MG tablet Take 1-2 tablets (50-100 mg total) by mouth every 6 (six) hours  as needed. (Patient taking differently: Take 50-100 mg by mouth every 6 (six) hours as needed for moderate pain. ) 24 tablet 0  . vitamin E 400 UNIT capsule Take 400 Units by mouth daily after lunch.    . clopidogrel (PLAVIX) 75 MG tablet Take 1 tablet (75 mg total) by mouth daily with breakfast. (Patient not taking: Reported on 09/22/2018) 90 tablet 1    Prior to Admission medications   Medication Sig Start Date End Date Taking? Authorizing Provider  Ascorbic Acid (VITAMIN C) 1000 MG tablet Take 1,000 mg by mouth daily after lunch.   Yes [provider]  aspirin 81 MG tablet Take 1 tablet (81 mg total) by mouth daily. Patient taking differently: Take 81 mg by mouth daily.  06/05/18  Yes Barrett, Evelene Croon, PA-C  cholecalciferol (VITAMIN D3) 25 MCG (1000 UT) tablet Take 1,000 Units by mouth daily.   Yes [provider]  dutasteride (AVODART) 0.5 MG capsule Take 0.5 mg by mouth daily.   Yes [provider]  escitalopram (LEXAPRO)  20 MG tablet Take 1 tablet (20 mg total) by mouth daily. Patient taking differently: Take 20 mg by mouth at bedtime.  09/06/16  Yes Angiulli, Lavon Paganini, PA-C  folic acid (FOLVITE) 1 MG tablet Take 1 mg by mouth every evening.  01/27/15  Yes [provider]  furosemide (LASIX) 20 MG tablet Take 20 mg by mouth.   Yes [provider]  ibuprofen (ADVIL,MOTRIN) 800 MG tablet Take 800 mg by mouth every morning.   Yes [provider]  losartan (COZAAR) 25 MG tablet Take 25 mg by mouth daily.  01/21/18  Yes [provider]  Melatonin 5 MG TABS Take 5 mg by mouth at bedtime as needed (for sleep).    Yes [provider]  methotrexate (RHEUMATREX) 2.5 MG tablet Take 22.5 mg by mouth every Sunday.  07/13/13  Yes [provider]  Omega-3 Fatty Acids (FISH OIL) 1000 MG CAPS Take 1,000 mg by mouth daily.   Yes [provider]  simvastatin (ZOCOR) 20 MG tablet TAKE 1 TABLET BY MOUTH EVERY NIGHT AT BEDTIME Patient taking differently: Take 20 mg by mouth every evening.  05/06/14  Yes Jerline Pain, MD  traMADol (ULTRAM) 50 MG tablet Take 1-2 tablets (50-100 mg total) by mouth every 6 (six) hours as needed. Patient taking differently: Take 50-100 mg by mouth every 6 (six) hours as needed for moderate pain.  08/23/18  Yes Armandina Gemma, MD  vitamin E 400 UNIT capsule Take 400 Units by mouth daily after lunch.   Yes [provider]  clopidogrel (PLAVIX) 75 MG tablet Take 1 tablet (75 mg total) by mouth daily with breakfast. Patient not taking: Reported on 09/22/2018 03/29/18   Eileen Stanford, PA-C    Blood pressure (!) 127/54, pulse (!) 49, temperature 98 F (36.7 C), resp. rate 16, height 5' 2.99" (1.6 m), weight 75.1 kg, SpO2 93 %. Physical Exam: General: pleasant, WD/WN white male who is laying in bed in NAD HEENT: head is normocephalic, atraumatic.  Sclera are noninjected.  Pupils equal and round.  Ears and nose without any masses or lesions.   Mouth is pink and moist. Dentition fair Heart: regular, rate, and rhythm.  No obvious murmurs, gallops, or rubs noted.  Palpable pedal pulses bilaterally Lungs: CTAB, no wheezes, rhonchi, or rales noted.  Respiratory effort nonlabored Abd: well healed right groin incisions, soft, NT/ND, +BS, no masses or organomegaly MS: all 4 extremities  are symmetrical with no cyanosis, clubbing, or edema. Skin: warm and dry with no masses, lesions, or rashes Psych: A&Ox3 with an appropriate affect. Neuro: cranial nerves grossly intact, extremity CSM intact bilaterally, normal speech  Results for orders placed or performed during the hospital encounter of 09/22/18 (from the past 48 hour(s))  Comprehensive metabolic panel     Status: Abnormal   Collection Time: 09/25/18  2:29 AM  Result Value Ref Range   Sodium 140 135 - 145 mmol/L   Potassium 4.4 3.5 - 5.1 mmol/L   Chloride 103 98 - 111 mmol/L   CO2 27 22 - 32 mmol/L   Glucose, Bld 79 70 - 99 mg/dL   BUN 16 8 - 23 mg/dL   Creatinine, Ser 1.18 0.61 - 1.24 mg/dL   Calcium 9.0 8.9 - 10.3 mg/dL   Total Protein 6.6 6.5 - 8.1 g/dL   Albumin 3.2 (L) 3.5 - 5.0 g/dL   AST 70 (H) 15 - 41 U/L   ALT 162 (H) 0 - 44 U/L   Alkaline Phosphatase 183 (H) 38 - 126 U/L   Total Bilirubin 2.8 (H) 0.3 - 1.2 mg/dL   GFR calc non Af Amer 55 (L) >60 mL/min   GFR calc Af Amer >60 >60 mL/min   Anion gap 10 5 - 15    Comment: Performed at [facility], [address], [place], [place] [address]  CBC     Status: Abnormal   Collection Time: 09/25/18  2:29 AM  Result Value Ref Range   WBC 6.2 4.0 - 10.5 K/uL   RBC 4.20 (L) 4.22 - 5.81 MIL/uL   Hemoglobin 12.1 (L) 13.0 - 17.0 g/dL   HCT 38.4 (L) 39.0 - 52.0 %   MCV 91.4 80.0 - 100.0 fL   MCH 28.8 26.0 - 34.0 pg   MCHC 31.5 30.0 - 36.0 g/dL   RDW 12.7 11.5 - 15.5 %   Platelets 121 (L) 150 - 400 K/uL    Comment: REPEATED TO VERIFY   nRBC 0.0 0.0 - 0.2 %    Comment: Performed at [facility], [place] [address] [place], [place] [address]  Comprehensive metabolic panel     Status: Abnormal   Collection Time: 09/26/18  2:36 AM  Result Value Ref Range   Sodium 136 135 - 145 mmol/L   Potassium 4.9 3.5 - 5.1 mmol/L   Chloride 104 98 - 111 mmol/L   CO2 21 (L) 22 - 32 mmol/L   Glucose, Bld 149 (H) 70 - 99 mg/dL   BUN 16 8 - 23 mg/dL   Creatinine, Ser 1.13 0.61 - 1.24 mg/dL   Calcium 8.8 (L) 8.9 - 10.3 mg/dL   Total Protein 6.1 (L) 6.5 - 8.1 g/dL   Albumin 3.0 (L) 3.5 - 5.0 g/dL   AST 69 (H) 15 - 41 U/L   ALT 121 (H) 0 - 44 U/L   Alkaline Phosphatase 181 (H) 38 - 126 U/L   Total Bilirubin 2.3 (H) 0.3 - 1.2 mg/dL   GFR calc non Af Amer 58 (L) >60 mL/min   GFR calc Af Amer >60 >60 mL/min   Anion gap 11 5 - 15    Comment: Performed at [facility], [place] [address], [place], [place] [address]  CBC with Differential/Platelet     Status: Abnormal (Preliminary result)   Collection Time: 09/26/18  2:36 AM  Result Value Ref Range   WBC 7.4 4.0 - 10.5 K/uL  RBC 3.87 (L) 4.22 - 5.81 MIL/uL   Hemoglobin 11.8 (L) 13.0 - 17.0 g/dL   HCT 33.5 (L) 39.0 - 52.0 %   MCV 86.6 80.0 - 100.0 fL   MCH 30.5 26.0 - 34.0 pg   MCHC 35.2 30.0 - 36.0 g/dL   RDW 12.5 11.5 - 15.5 %   Platelets 122 (L) 150 - 400 K/uL    Comment: REPEATED TO VERIFY PLATELET COUNT CONFIRMED BY SMEAR SPECIMEN CHECKED FOR CLOTS Immature Platelet Fraction may be clinically indicated, consider ordering this additional test [id number]    nRBC 0.0 0.0 - 0.2 %    Comment: Performed at [facility], [place] [address], [place], [place] [address]   Neutrophils Relative % PENDING %   Neutro Abs PENDING 1.7 - 7.7 K/uL   Band Neutrophils PENDING %   Lymphocytes Relative PENDING %   Lymphs Abs PENDING 0.7 - 4.0 K/uL   Monocytes Relative PENDING %   Monocytes Absolute PENDING 0.1 - 1.0 K/uL   Eosinophils Relative PENDING %   Eosinophils Absolute PENDING 0.0 - 0.5 K/uL   Basophils Relative PENDING %   Basophils Absolute PENDING 0.0 - 0.1  K/uL   WBC Morphology PENDING    RBC Morphology PENDING    Smear Review PENDING    Other PENDING %   nRBC PENDING 0 /100 WBC   Metamyelocytes Relative PENDING %   Myelocytes PENDING %   Promyelocytes Relative PENDING %   Blasts PENDING %   Mr 3d Recon At Scanner  Result Date: 09/24/2018 CLINICAL DATA:  85 year old male with history of common bile duct stone and biliary obstruction. EXAM: MRI ABDOMEN WITHOUT AND WITH CONTRAST (INCLUDING MRCP) TECHNIQUE: Multiplanar multisequence MR imaging of the abdomen was performed both before and after the administration of intravenous contrast. Heavily T2-weighted images of the biliary and pancreatic ducts were obtained, and three-dimensional MRCP images were rendered by post processing. CONTRAST:  8 mL of Gadavist. COMPARISON:  No priors. Abdominal ultrasound 09/22/2018. CT the abdomen and pelvis 02/07/2018. FINDINGS: Comment: Today's study is slightly limited by patient respiratory motion. Lower chest: Unremarkable. Hepatobiliary: No suspicious cystic or solid hepatic lesions. No intrahepatic biliary ductal dilatation. Common bile duct is borderline dilated measuring 7 mm in the porta hepatis (within normal limits for the patient's age). Several small filling defects are noted in the common bile duct, largest of which measures up to 5 mm distally shortly before the ampulla. In addition, amorphous filling defects are noted in the gallbladder, indicative of a combination of biliary sludge and small gallstones. Gallbladder is moderately distended, with some mild gallbladder wall thickening (6 mm) and increased T2 signal intensity in the wall, indicative of edema. Trace volume of T2 signal intensity adjacent to the gallbladder, indicative of pericholecystic fluid. This is contiguous with increased T2 signal intensity adjacent to the descending portion of the duodenum. Pancreas: No pancreatic mass. Small amount of increased T2 signal intensity adjacent to the head of  the pancreas and the adjacent descending duodenum. No pancreatic ductal dilatation noted on MRCP images. No peripancreatic fluid collections. Spleen:  Unremarkable. Adrenals/Urinary Tract: Multiple T1 hypointense, T2 hyperintense, nonenhancing lesions in both kidneys, compatible with simple cysts, largest of which is exophytic in the anterior aspect of the interpolar region of the right kidney measuring 3.6 cm in diameter. No definite suspicious renal lesions. No hydroureteronephrosis in the visualized portions of the abdomen. Bilateral adrenal glands are normal in appearance. Stomach/Bowel: Normal appearance of the stomach. Increased T2 signal  intensity surrounding the descending portion of the duodenum. Remaining portions of visualized small bowel and colon are unremarkable. Vascular/Lymphatic: Aortic atherosclerosis, without evidence of aneurysm in the abdominal vasculature. No lymphadenopathy noted in the abdomen. Other: No significant volume of ascites in the visualized portions of the peritoneal cavity. Musculoskeletal: No aggressive appearing osseous lesions are noted in the visualized portions of the skeleton. IMPRESSION: 1. Study is positive for biliary sludge and cholelithiasis as well as choledocholithiasis. At this time, there is no evidence of frank biliary tract obstruction despite the presence of common bile duct stones measuring up to 5 mm in the periampullary region. Notably, the gallbladder is moderately distended with some mild gallbladder wall thickening and edema, as well as trace amount of pericholecystic fluid. There is also some fluid adjacent to the head of the pancreas and descending portion of the duodenum, which could indicate an associated pancreatitis. 2. Aortic atherosclerosis. Electronically Signed   By: Vinnie Langton M.D.   On: 09/24/2018 11:50   Dg Ercp Biliary & Pancreatic Ducts  Result Date: 09/25/2018 CLINICAL DATA:  Common bile duct calculus EXAM: ERCP TECHNIQUE: Multiple  spot images obtained with the fluoroscopic device and submitted for interpretation post-procedure. FLUOROSCOPY TIME:  Fluoroscopy Time:  3 minutes and 17 seconds Radiation Exposure Index (if provided by the fluoroscopic device): Number of Acquired Spot Images: 0 COMPARISON:  None. FINDINGS: Images demonstrate cannulation of the common bile duct followed by balloon stone retrieval. IMPRESSION: See above. These images were submitted for radiologic interpretation only. Please see the procedural report for the amount of contrast and the fluoroscopy time utilized. Electronically Signed   By: Marybelle Killings M.D.   On: 09/25/2018 14:41   Mr Abdomen Mrcp Moise Boring Contast  Result Date: 09/24/2018 CLINICAL DATA:  85 year old male with history of common bile duct stone and biliary obstruction. EXAM: MRI ABDOMEN WITHOUT AND WITH CONTRAST (INCLUDING MRCP) TECHNIQUE: Multiplanar multisequence MR imaging of the abdomen was performed both before and after the administration of intravenous contrast. Heavily T2-weighted images of the biliary and pancreatic ducts were obtained, and three-dimensional MRCP images were rendered by post processing. CONTRAST:  8 mL of Gadavist. COMPARISON:  No priors. Abdominal ultrasound 09/22/2018. CT the abdomen and pelvis 02/07/2018. FINDINGS: Comment: Today's study is slightly limited by patient respiratory motion. Lower chest: Unremarkable. Hepatobiliary: No suspicious cystic or solid hepatic lesions. No intrahepatic biliary ductal dilatation. Common bile duct is borderline dilated measuring 7 mm in the porta hepatis (within normal limits for the patient's age). Several small filling defects are noted in the common bile duct, largest of which measures up to 5 mm distally shortly before the ampulla. In addition, amorphous filling defects are noted in the gallbladder, indicative of a combination of biliary sludge and small gallstones. Gallbladder is moderately distended, with some mild gallbladder wall  thickening (6 mm) and increased T2 signal intensity in the wall, indicative of edema. Trace volume of T2 signal intensity adjacent to the gallbladder, indicative of pericholecystic fluid. This is contiguous with increased T2 signal intensity adjacent to the descending portion of the duodenum. Pancreas: No pancreatic mass. Small amount of increased T2 signal intensity adjacent to the head of the pancreas and the adjacent descending duodenum. No pancreatic ductal dilatation noted on MRCP images. No peripancreatic fluid collections. Spleen:  Unremarkable. Adrenals/Urinary Tract: Multiple T1 hypointense, T2 hyperintense, nonenhancing lesions in both kidneys, compatible with simple cysts, largest of which is exophytic in the anterior aspect of the interpolar region of the right kidney measuring  3.6 cm in diameter. No definite suspicious renal lesions. No hydroureteronephrosis in the visualized portions of the abdomen. Bilateral adrenal glands are normal in appearance. Stomach/Bowel: Normal appearance of the stomach. Increased T2 signal intensity surrounding the descending portion of the duodenum. Remaining portions of visualized small bowel and colon are unremarkable. Vascular/Lymphatic: Aortic atherosclerosis, without evidence of aneurysm in the abdominal vasculature. No lymphadenopathy noted in the abdomen. Other: No significant volume of ascites in the visualized portions of the peritoneal cavity. Musculoskeletal: No aggressive appearing osseous lesions are noted in the visualized portions of the skeleton. IMPRESSION: 1. Study is positive for biliary sludge and cholelithiasis as well as choledocholithiasis. At this time, there is no evidence of frank biliary tract obstruction despite the presence of common bile duct stones measuring up to 5 mm in the periampullary region. Notably, the gallbladder is moderately distended with some mild gallbladder wall thickening and edema, as well as trace amount of pericholecystic  fluid. There is also some fluid adjacent to the head of the pancreas and descending portion of the duodenum, which could indicate an associated pancreatitis. 2. Aortic atherosclerosis. Electronically Signed   By: Vinnie Langton M.D.   On: 09/24/2018 11:50    Anti-infectives (From admission, onward)   Start     Dose/Rate Route Frequency Ordered Stop   09/22/18 2200  piperacillin-tazobactam (ZOSYN) IVPB 3.375 g     3.375 g 12.5 mL/hr over 240 Minutes Intravenous Every 8 hours 09/22/18 1809     09/22/18 1515  piperacillin-tazobactam (ZOSYN) IVPB 3.375 g     3.375 g 100 mL/hr over 30 Minutes Intravenous  Once 09/22/18 1508 09/22/18 1635       Assessment/Plan HTN BPH GERD Rheumatoid arthritis Diastolic CHF (EF 24-40% 04/08/24) Aortic stenosis s/p TAVR 03/27/2018 Code status DNR  Choledocholithiasis - Patient has eaten breakfast and cannot have surgery today. Will plan for laparoscopic cholecystectomy tomorrow. NPO after midnight. Will discuss with patient's daughter.  ID - zosyn 4/11>> VTE - SCDs, lovenox FEN - IVF, soft diet, NPO after MN Foley - none Follow up - TBD   Wellington Hampshire, [facility] Surgery 09/26/2018, 9:45 AM Pager: [phone] Mon-Thurs 7:00 am-4:30 pm Fri 7:00 am -11:30 AM Sat-Sun 7:00 am-11:30 am

## 2018-09-26 NOTE — Discharge Instructions (Signed)
????????????????? ??????????????? Laparoscopic Cholecystectomy ????????????????? ??????????????? ???????? ????????? ?? ???????? ???????? ??????. ??????? ?????? -- ??? ??????????? ?????, ????????????? ??? ??????? ?? ?????? ??????? ????. ? ??????? ?????? ???????? ????? -- ????????, ??????? ???????? ????????? ???????????? ????. ??????????????? ????? ?????????? ?? ?????? ?????????? ???????? ??????(???????????). ??? ??????????? ?????? ??????????? ??????????? ??????? ?????? ? ??????? ?????? (?????????????? ???????). ??????? ????? ????? ????????????? ??????? ???????, ??? ????? ????????? ? ?????????? ? ????????? ?????. ? ??????? ??????? ????? ????????????? ????????????? ?????????????. ??? ????????? ????????? ????? ????????? ??????? ? ?????? (??????? ????????????????? ????????). ????? ???? ?? ???? ???????? ??????????? ?????? ?????? ? ???????????? (??????????). ????? ?????? ????????? ???????? ?????? ????????????? ???????????. ? ????????? ??????? ????????????????? ????????? ????? ???????????? ? ????????, ??? ??????? ?????? ??????? ?????? (????? ????????? ?????????????? ?????????????). ???????? ???????? ????? ?:  ????? ????????? ?????????.  ???? ??????????? ??????????, ??????? ????????, ?????????????, ??????? ?????, ???? ? ????????????? ????????, ??????????? ??? ???????.  ????? ????????? ? ??????????? ??????????????, ??????? ????? ? ??? ??? ?????? ????? ?????.  ????? ????????? ???????????? ?????.  ????? ????? ???????????? ?????????.  ????? ????????? ????????????.  ???, ??? ?? ????????? ??? ?????? ???? ?????????. ?????? ?????? ??? ???????, ??? ?????????? ?????????. ?????? ????? ?????????? ??????????, ? ??? ?????:  ????????.  ????????????.  ????????????? ??????? ?? ????????????? ?????????.  ??????????? ?????? ???????? ??? ???????.  ?????? ????? ???????? ? ????? ??????? ???????. ????? ??????? ?????? ????? ????? ?? ???????? ?????? ? ????????.  ?????? ????? ?? ????? ???????? ???????, ??????? ????????,  ????? ????????? ??????? ??????. ??? ?????????? ????? ??????????? ??????????? ????????? ????????? ?????????? ??????????? ?????? ???????? ????? ? ????????? ?????? ??????????? ?????????, ??????? ????? ????????:  ???????? ?? 2 ???? ?? ????????? ?? ?????? ?????????? ???? ?????????? ????????, ????????, ????, ????????? ??? ??? ??????, ?????? ???? ? ?????? ???. ??????????? ??? ? ????? ?????????? ??????????? ?????? ???????? ????? ? ????????? ??????????? ??? ??? ?????, ? ??? ?????:  ?? 8 ????? ?? ????????? ?????????? ????? ??????? ???? ??? ????? ?????????, ??? ????, ??????? ????? ??? ?????? ????.  ?? 6 ????? ?? ????????? ?????????? ?????? ???????? ??? ???????????? ????? ?????????, ??? ????? ??? ????.  ?? 6 ????? ?? ????????? ?????????? ???? ?????? ??? ???????, ?????????? ??????.  ?? 2 ???? ?? ????????? ?????????? ???? ?????????? ????????. ????????????? ?????????  ???????? ?????? ???????? ????? ?: ? ????????? ??? ?????? ?????? ??????? ????????. ??? ???????? ?????, ???? ?? ?????????? ???????????????????? ????????? ??? ????????? ??? ?????????? ?????. ? ?????? ????? ????????, ??? ??????? ? ?????????. ??? ????????? ????? ?????????????? ?????????? ?????. ?? ?????????? ??? ????????? ????? ??????????, ???? ??????? ???? ??????????? ?? ????????? ??.  ??? ?????????????? ???????? ??? ????? ????????? ??????????. ????? ????????  ???? ? ??? ????????? ???????? ??? ?????? ???????????? ???????????, ??????? ?? ????????? ???????? ?? ???? ?????? ?????.  ????????????, ????? ???-?? ?????? ??? ????? ?? ???????? ??? ???????.  ???????? ? ?????? ???????? ?????, ??? ????? ????????????? ??? ???????????? ????? ?????????????? ???????. ??? ?????????? ?? ????? ??????????   ??? ?????????? ????? ????????????? ????????: ? ????????, ??????????? ? ?????????? ?????????, ?????? ??? ???????????????? ???? ????. ? ???? ???? ?????? ? ?????. ? ? ??????? ?????????????? ??????? ????? ???? ??????? ??????.  ? ???? ?? ??? ?????  ?????????? ??????????.  ??? ????? ???? ??? ????????? ?? ??????????: ? ?????????, ??????? ??????? ??? ??????????? (?????????? ????????). ? ????????, ??????? ??????? ??? (????? ?????????).  ??? ? ??? ?????? ??????????? ??????.  ?? ?????? ?????? ??????? ????????? ????????? ???????? (????????).  ????? ???? ?? ????????? ???????? ?????? ??????????. ?????? ??????????? ????? ?????????? ??????????? ?? ????????????? ????? (???????) ? ????????????. ??? ????????? ??????? ?????? ?????????? ??????? ???????.  ? ??????? ??????? ???????? ??????? ?? ?????? ???. ??? ???????? ????????? ?????? ??????, ????? ??????? ???? ????? ????????? ????????.  ????? ?????? ??????? ?????? ?????? ????????????? ???????????. ??????? ?????? ????? ?????? ????? ???? ?? ???? ????????.  ??? ???? ????? ???? ???????? ????? ??????? ??????. ???? ? ????? ??????? ??????? ????? ?????????? ?????, ?? ????? ???????.  ????? ???????? ???????? ?????? ??????? ????? ??????? ? ??????? ???? (???????), ?????? ??? ??????? ????.  ?? ????? ???????? ????? ???????? ??????? (??????). ?????? ????? ? ?????? ????????? ????? ????????? ??? ????????? ??-???????. ??? ?????????? ????? ??????????  ??? ????? ???????? ???????????? ????????, ??????? ????????? ??????????, ??????? ??????? ? ??????? ????????? ? ?????, ???? ?? ??????????? ???????? ????????? ????????.  ??? ????????????? ??? ????? ????????? ?????????????? ?????????.  ?? ??????? ???????? ?? ???? ? ???????  24 ?????, ???? ??? ?????? ?????????????? ????????. ??? ?????????? ?? ????? ???????? ??????, ??????????????? ????? ??????. ??????????? ???????? ????? ???????????? ??? ??????? ? ????? ??????? ??????. Document Released: 10/16/2008 Document Revised: 05/19/2017 Document Reviewed: 11/16/2015 Elsevier Interactive Patient Education  2019 [employer].

## 2018-09-26 NOTE — Discharge Summary (Addendum)
Physician Discharge Summary  Cirilo Canner IHK:[id number] DOB: 1927-09-06 DOA: 09/22/2018  PCP: Lajean Manes, MD  Admit date: 09/22/2018 Discharge date: 09/26/2018  Admitted From: home Discharge disposition: home   Recommendations for Outpatient Follow-Up:   Outpatient follow up with General Surgery to schedule cholecystectomy Patient to return to the ER if symptoms re-occur-- risk of another stone prior to surgery-- granddaughter and daughter aware CMP 1 week  Discharge Diagnosis:   Active Problems:   Sinus bradycardia   Benign essential HTN   Severe aortic stenosis   S/P TAVR (transcatheter aortic valve replacement)   RA (rheumatoid arthritis) (HCC)   HLD (hyperlipidemia)   Chronic diastolic CHF (congestive heart failure) (HCC)   Elevated LFTs   AKI (acute kidney injury) ([facility])    Discharge Condition: Improved.  Diet recommendation: low fat  Wound care: None.  Code status: dnr   History of Present Illness:   [name] is a 85 y.o. male with medical history significant of severe aortic stenosis status post TAVR, hypertension, BPH, diastolic CHF,GERD rheumatoid arthritis on methotrexate who presented to the emergency department complaining of chest pain/shortness of breath with nausea and vomiting.  Patient is Russian-speaking and history was obtained via daughter through phone call who is a physician.  Apparently patient developed slight shortness of breath yesterday along with nausea and vomiting.  Shortness of breath has improved now however he was vomiting last night.  This morning patient developed chest/epigastric pain and continues to be severely nauseous.  Patient's daughter reported patient denies any fever, cough, weakness, COVID-19 positive contacts and recent travel.  Patient family called his PCP who recommended to be evaluated in the ED.   Hospital Course by Problem:   Cholelithiasis/Choledocholithiasis - MRCP confirms sludge with stones and  biliary tract obstruction along with distended gallbladder. - s/p ERCP and now plan is for cholecystectomy as an outpatient -finish course of abx  Chronic diastolic CHF - Appears euvolemic currently  AKI - March 2020 creatinine WNL - trending down  Rule out COVID-19 - COVID-19 negative -Patient's presenting SOB and fever most likely secondary to his cholelithiasis/choledocholithiasis.  Currently asymptomatic    Medical Consultants:   GI General surgery   Discharge Exam:   Vitals:   09/25/18 2121 09/26/18 0535  BP: (!) 118/53 (!) 127/54  Pulse: (!) 58 (!) 49  Resp: 16 16  Temp: 98.4 F (36.9 C) 98 F (36.7 C)  SpO2: 96% 93%   Vitals:   09/25/18 1502 09/25/18 2121 09/26/18 0535 09/26/18 0637  BP: (!) 176/56 (!) 118/53 (!) 127/54   Pulse: (!) 52 (!) 58 (!) 49   Resp:  16 16   Temp: 97.8 F (36.6 C) 98.4 F (36.9 C) 98 F (36.7 C)   TempSrc: Oral Oral    SpO2: 97% 96% 93%   Weight:    75.1 kg  Height:           The results of significant diagnostics from this hospitalization (including imaging, microbiology, ancillary and laboratory) are listed below for reference.     Procedures and Diagnostic Studies:   Dg Chest Port 1 View  Result Date: 09/22/2018 CLINICAL DATA:  Acute onset of mid chest pain that began last night. Current history of CHF and hypertension. Prior [place]. EXAM: PORTABLE CHEST 1 VIEW COMPARISON:  CTA chest 06/04/2018. Chest x-rays 06/04/2018 and earlier. FINDINGS: Cardiac silhouette normal in size, unchanged. Prior CABG. Thoracic aorta mildly atherosclerotic and tortuous. Hilar and mediastinal contours otherwise unremarkable. Mildly  prominent bronchovascular markings diffusely and mild central peribronchial thickening, unchanged. Lungs otherwise clear. No localized airspace consolidation. No pleural effusions. No pneumothorax. Normal pulmonary vascularity. IMPRESSION: Stable mild changes of chronic bronchitis and/or asthma. No acute  cardiopulmonary disease. Electronically Signed   By: Evangeline Dakin M.D.   On: 09/22/2018 11:25   US Abdomen Limited Ruq  Result Date: 09/22/2018 CLINICAL DATA:  Elevated liver function test. EXAM: ULTRASOUND ABDOMEN LIMITED RIGHT UPPER QUADRANT COMPARISON:  None. FINDINGS: Gallbladder: Mixed echogenicity material within the gallbladder, presumably sludge. Hyperechoic masslike focus within the gallbladder measures 1.3 x 1.2 cm, most likely tumefactive sludge. No gallbladder wall thickening or pericholecystic fluid. No sonographic Murphy's sign elicited during the exam. Common bile duct: Diameter: 5 mm Liver: Liver echotexture is coarse and there is nodularity of the peripheral liver margins suggesting cirrhosis. No focal mass or lesion within the liver. Portal vein is patent on color Doppler imaging with normal direction of blood flow towards the liver. Incidental note of a RIGHT renal cyst, measuring 3.6 cm, corresponding to a benign cyst demonstrated on CT dated 02/07/2018. IMPRESSION: 1. No evidence of cholecystitis. No discrete gallstones identified. Hyperechoic masslike structure within the gallbladder measures 1.3 cm, most likely tumefactive sludge. Additional layering sludge within the gallbladder. 2. No bile duct dilatation. 3. Probable liver cirrhosis. Electronically Signed   By: Franki Cabot M.D.   On: 09/22/2018 13:22     Labs:   Basic Metabolic Panel: Recent Labs  Lab 09/22/18 1044 09/23/18 0537 09/24/18 0640 09/25/18 0229 09/26/18 0236  NA 137 138 139 140 136  K 3.9 3.9 4.0 4.4 4.9  CL 102 102 103 103 104  CO2 23 25 25 27  21*  GLUCOSE 157* 133* 120* 79 149*  BUN 11 17 17 16 16   CREATININE 1.04 1.46* 1.28* 1.18 1.13  CALCIUM 9.0 8.5* 8.7* 9.0 8.8*  MG  --   --  2.0  --   --    GFR Estimated Creatinine Clearance: 41 mL/min (by C-G formula based on SCr of 1.13 mg/dL). Liver Function Tests: Recent Labs  Lab 09/22/18 1044 09/23/18 0537 09/24/18 0640 09/25/18 0229  09/26/18 0236  AST 542* 226* 110* 70* 69*  ALT 496* 334* 217* 162* 121*  ALKPHOS 152* 179* 192* 183* 181*  BILITOT 4.3* 7.3* 3.3* 2.8* 2.3*  PROT 7.0 6.5 6.4* 6.6 6.1*  ALBUMIN 3.9 3.4* 3.1* 3.2* 3.0*   Recent Labs  Lab 09/22/18 1044  LIPASE 41   No results for input(s): AMMONIA in the last 168 hours. Coagulation profile Recent Labs  Lab 09/23/18 0537  INR 1.3*    CBC: Recent Labs  Lab 09/22/18 1044 09/23/18 0537 09/24/18 0640 09/25/18 0229 09/26/18 0236  WBC 7.3 6.8 8.1 6.2 7.4  NEUTROABS 5.7  --  6.1  --  6.3  HGB 13.0 12.2* 12.0* 12.1* 11.8*  HCT 40.0 38.5* 36.4* 38.4* 33.5*  MCV 92.0 90.6 90.8 91.4 86.6  PLT PLATELET CLUMPS NOTED ON SMEAR, COUNT APPEARS DECREASED 100* 107* 121* 122*   Cardiac Enzymes: Recent Labs  Lab 09/22/18 1044 09/22/18 1339  TROPONINI <0.03 <0.03   BNP: Invalid input(s): POCBNP CBG: No results for input(s): GLUCAP in the last 168 hours. D-Dimer No results for input(s): DDIMER in the last 72 hours. Hgb A1c No results for input(s): HGBA1C in the last 72 hours. Lipid Profile No results for input(s): CHOL, HDL, LDLCALC, TRIG, CHOLHDL, LDLDIRECT in the last 72 hours. Thyroid function studies No results for input(s): TSH, T4TOTAL, T3FREE, THYROIDAB  in the last 72 hours.  Invalid input(s): FREET3 Anemia work up No results for input(s): VITAMINB12, FOLATE, FERRITIN, TIBC, IRON, RETICCTPCT in the last 72 hours. Microbiology Recent Results (from the past 240 hour(s))  Blood culture (routine x 2)     Status: None (Preliminary result)   Collection Time: 09/22/18  3:09 PM  Result Value Ref Range Status   Specimen Description BLOOD SITE NOT SPECIFIED  Final   Special Requests   Final    BOTTLES DRAWN AEROBIC AND ANAEROBIC Blood Culture results may not be optimal due to an inadequate volume of blood received in culture bottles   Culture   Final    NO GROWTH 3 DAYS Performed at [facility], [place] [address], [place], [place]  [address]    Report Status PENDING  Incomplete  Blood culture (routine x 2)     Status: None (Preliminary result)   Collection Time: 09/22/18  3:14 PM  Result Value Ref Range Status   Specimen Description BLOOD RIGHT ANTECUBITAL  Final   Special Requests   Final    BOTTLES DRAWN AEROBIC AND ANAEROBIC Blood Culture results may not be optimal due to an excessive volume of blood received in culture bottles   Culture   Final    NO GROWTH 3 DAYS Performed at [facility], [place] [address] [place], [place] [address]    Report Status PENDING  Incomplete  Novel Coronavirus, NAA (hospital order; send-out to ref lab)     Status: None   Collection Time: 09/22/18 11:19 PM  Result Value Ref Range Status   SARS-CoV-2, NAA NOT DETECTED NOT DETECTED Final    Comment: Negative (Not Detected) results do not exclude infection caused by SARS CoV 2 and should not be used as the sole basis for treatment or other patient management decisions. Optimum specimen types and timing for peak viral levels during infections caused  by SARS CoV 2 have not been determined. Collection of multiple specimens (types and time points) from the same patient may be necessary to detect the virus. Improper specimen collection and handling, sequence variability underlying assay primers and or probes, or the presence of organisms in  quantities less than the limit of detection of the assay may lead to false negative results. Positive and negative predictive values of testing are highly dependent on prevalence. False negative results are more likely when prevalence of disease is high. Performed at [facility] (NOTE) The expected result is Negative (Not Detected). The SARS CoV 2 test is intended for the presumptive qualitative  detection of nucleic acid from SARS C oV 2 in upper and lower  respiratory specimens. Testing methodology is real time RT PCR. Test results must be correlated with clinical presentation and  evaluated  in the context of other laboratory and epidemiologic data.  Test performance can be affected because the epidemiology and  clinical spectrum of infection caused by SARS CoV 2 is not fully  known. For example, the optimum types of specimens to collect and  when during the course of infection these specimens are most likely  to contain detectable viral RNA may not be known. This test has not been Food and Drug Administration (FDA) cleared or  approved and has been authorized by FDA under an Emergency Use  Authorization (EUA). The test is only authorized for the duration of  the declaration that circumstances exist justifying the authorization  of emergency use of in vitro diagnostic tests for detection and or  diagnosis of SARS CoV 2 under Section 564(b)(1) of the Act, 21 U.S.C.  section [phone] 3(b)(1), unless th e authorization is terminated or  revoked sooner. [facility] is certified under the  Clinical Laboratory Improvement Amendments of 1988 (CLIA), 42 U.S.C.  section [phone], to perform high complexity tests. Performed at [facility] [id number] [address], [address], [place], [place] [address] Laboratory Director: Loleta Books, MD Fact Sheet for Healthcare Providers  BankingDealers.co.za Fact Sheet for Patients  StrictlyIdeas.no    Coronavirus Source NASOPHARYNGEAL  Final    Comment: Performed at [facility], [address] [place], [place] [address]  Respiratory Panel by PCR     Status: None   Collection Time: 09/23/18 11:12 AM  Result Value Ref Range Status   Adenovirus NOT DETECTED NOT DETECTED Final   Coronavirus 229E NOT DETECTED NOT DETECTED Final    Comment: (NOTE) The Coronavirus on the Respiratory Panel, DOES NOT test for the novel  Coronavirus (2019 nCoV)    Coronavirus HKU1 NOT DETECTED NOT DETECTED Final   Coronavirus NL63 NOT DETECTED NOT DETECTED Final    Coronavirus OC43 NOT DETECTED NOT DETECTED Final   Metapneumovirus NOT DETECTED NOT DETECTED Final   Rhinovirus / Enterovirus NOT DETECTED NOT DETECTED Final   Influenza A NOT DETECTED NOT DETECTED Final   Influenza B NOT DETECTED NOT DETECTED Final   Parainfluenza Virus 1 NOT DETECTED NOT DETECTED Final   Parainfluenza Virus 2 NOT DETECTED NOT DETECTED Final   Parainfluenza Virus 3 NOT DETECTED NOT DETECTED Final   Parainfluenza Virus 4 NOT DETECTED NOT DETECTED Final   Respiratory Syncytial Virus NOT DETECTED NOT DETECTED Final   Bordetella pertussis NOT DETECTED NOT DETECTED Final   Chlamydophila pneumoniae NOT DETECTED NOT DETECTED Final   Mycoplasma pneumoniae NOT DETECTED NOT DETECTED Final    Comment: Performed at [facility], [place], [place], [place] [address]     Discharge Instructions:   Discharge Instructions    Discharge instructions   Complete by:  As directed    Low fat diet CMP (liver enzymes) in 1 week   Increase activity slowly   Complete by:  As directed      Allergies as of 09/26/2018      Reactions   Ativan [lorazepam] Other (See Comments)   hallucinations   Phenergan [promethazine Hcl] Hypertension   Eggs Or Egg-derived Products Rash   Small rash after eating for several days in a row      Medication List    STOP taking these medications   clopidogrel 75 MG tablet Commonly known as:  PLAVIX   ibuprofen 800 MG tablet Commonly known as:  ADVIL,MOTRIN   methotrexate 2.5 MG tablet Commonly known as:  RHEUMATREX   simvastatin 20 MG tablet Commonly known as:  ZOCOR     TAKE these medications   amoxicillin-clavulanate 875-125 MG tablet Commonly known as:  AUGMENTIN Take 1 tablet by mouth every 12 (twelve) hours.   aspirin 81 MG tablet Take 1 tablet (81 mg total) by mouth daily.   cholecalciferol 25 MCG (1000 UT) tablet Commonly known as:  VITAMIN D3 Take 1,000 Units by mouth daily.   dutasteride 0.5 MG capsule Commonly  known as:  AVODART Take 0.5 mg by mouth daily.   escitalopram 20 MG tablet Commonly known as:  LEXAPRO Take 1 tablet (20 mg total) by mouth daily. What changed:  when to take this   Fish  Oil 1000 MG Caps Take 1,000 mg by mouth daily.   folic acid 1 MG tablet Commonly known as:  FOLVITE Take 1 mg by mouth every evening.   furosemide 20 MG tablet Commonly known as:  LASIX Take 20 mg by mouth.   losartan 25 MG tablet Commonly known as:  COZAAR Take 25 mg by mouth daily.   Melatonin 5 MG Tabs Take 5 mg by mouth at bedtime as needed (for sleep).   pantoprazole 40 MG tablet Commonly known as:  PROTONIX Take 1 tablet (40 mg total) by mouth daily. Start taking on:  September 27, 2018   traMADol 50 MG tablet Commonly known as:  ULTRAM Take 1-2 tablets (50-100 mg total) by mouth every 6 (six) hours as needed. What changed:  reasons to take this   vitamin C 1000 MG tablet Take 1,000 mg by mouth daily after lunch.   vitamin E 400 UNIT capsule Take 400 Units by mouth daily after lunch.      Follow-up Information    Ralene Ok, MD. Schedule an appointment as soon as possible for a visit in 1 month(s).   Specialty:  General Surgery Contact information: [address] [place] [address] [phone]        Lajean Manes, MD Follow up in 1 week(s).   Specialty:  Internal Medicine Why:  CMP Contact information: [address] [place] [address] [phone]            Time coordinating discharge: 35 min  Signed:  Geradine Girt DO  [facility] 09/26/2018, 2:24 PM

## 2018-09-26 NOTE — Progress Notes (Signed)
Pt discharged to home, will follow up outpatient for surgery. IV removed, AVS given. I discussed discharge instructions with patient, he asked me to call his daughters.  I called Claris Che [phone]) per pt request. She conference called her sister [name] and I discussed the AVS and plan with them. I answered their questions. They are coming to pick patient up.

## 2018-09-26 NOTE — Anesthesia Postprocedure Evaluation (Signed)
Anesthesia Post Note  Patient: [name]  Procedure(s) Performed: ENDOSCOPIC RETROGRADE CHOLANGIOPANCREATOGRAPHY (ERCP) (N/A ) SPHINCTEROTOMY REMOVAL OF STONES     Patient location during evaluation: PACU Anesthesia Type: General Level of consciousness: awake and alert Pain management: pain level controlled Vital Signs Assessment: post-procedure vital signs reviewed and stable Respiratory status: spontaneous breathing, nonlabored ventilation, respiratory function stable and patient connected to nasal cannula oxygen Cardiovascular status: blood pressure returned to baseline and stable Postop Assessment: no apparent nausea or vomiting Anesthetic complications: no    Last Vitals:  Vitals:   09/25/18 2121 09/26/18 0535  BP: (!) 118/53 (!) 127/54  Pulse: (!) 58 (!) 49  Resp: 16 16  Temp: 36.9 C 36.7 C  SpO2: 96% 93%    Last Pain:  Vitals:   09/25/18 2121  TempSrc: Oral  PainSc:                  Meisha Salone S

## 2018-09-26 NOTE — Progress Notes (Signed)
PROGRESS NOTE    [name]  OFB:[id number] DOB: 07-24-1927 DOA: 09/22/2018 PCP: Lajean Manes, MD   Brief Narrative:  85 y.o. WM RUSSIAN speaking PMHx Essential HTN, chronic diastolic CHF, severe aortic stenosis status post TAVR, BPH, GERD rheumatoid arthritis on methotrexate   Presented to the emergency department complaining of chest pain/shortness of breath with nausea and vomiting.  Patient is Russian-speaking and history was obtained via daughter through phone call who is a physician.  Apparently patient developed slight shortness of breath yesterday along with nausea and vomiting.  Thought to have biliary sludge and cholelithiasis as well as choledocholithiasis.    Subjective: Ate well this AM  Assessment & Plan:   Active Problems:   Sinus bradycardia   Benign essential HTN   Severe aortic stenosis   S/P TAVR (transcatheter aortic valve replacement)   RA (rheumatoid arthritis) (HCC)   HLD (hyperlipidemia)   Chronic diastolic CHF (congestive heart failure) (HCC)   Elevated LFTs   AKI (acute kidney injury) ([place])  Cholelithiasis/Choledocholithiasis - MRCP confirms sludge with stones and biliary tract obstruction along with distended gallbladder. - s/p ERCP and now plan is for cholecystectomy  Cholangitis - No evidence of cholangitis however, given that patient will undergo 2 surgical procedures will continue with his current antibiotics with a stop date of tomorrow  Chronic diastolic CHF - Appears euvolemic currently - Strict in and out -Daily weight  Essential HTN -Was on Lasix and losartan at home will hold   AKI - March 2020 creatinine WNL - trending down  Rule out COVID-19 -  COVID-19 negative -Patient's presenting SOB and fever most likely secondary to his cholelithiasis/choledocholithiasis.  Currently asymptomatic      DVT prophylaxis: Lovenox Code Status: DNR Family Communication: family spoken to by CCS Disposition Plan: plan for GB removal in  AM   Consultants:  [facility]    Procedures/Significant Events:  4/13 MRCP abdomen:-POSITIVE  biliary sludge and cholelithiasis as well as choledocholithiasis. At this time, there is no evidence of frank biliary tract obstruction despite the presence of common bile duct stones measuring up to 5 mm in the periampullary region.  -Gallbladder is moderately distended with some mild gallbladder wall thickening and edema, as well as trace amount of pericholecystic fluid.  -fluid adjacent to the head of the pancreas and descending portion of the duodenum, which could indicate an associated pancreatitis.     Cultures 4/11 COVID-19 negative 4/12 respiratory virus panel negative 4/14 ERCP 4/16 planned cholecystectomy    Antimicrobials: Anti-infectives (From admission, onward)   Start     Stop   09/22/18 2200  piperacillin-tazobactam (ZOSYN) IVPB 3.375 g         09/22/18 1515  piperacillin-tazobactam (ZOSYN) IVPB 3.375 g     09/22/18 1635         Continuous Infusions: . sodium chloride 10 mL/hr at 09/22/18 2300  . piperacillin-tazobactam (ZOSYN)  IV 3.375 g (09/26/18 0602)     Objective: Vitals:   09/25/18 1502 09/25/18 2121 09/26/18 0535 09/26/18 0637  BP: (!) 176/56 (!) 118/53 (!) 127/54   Pulse: (!) 52 (!) 58 (!) 49   Resp:  16 16   Temp: 97.8 F (36.6 C) 98.4 F (36.9 C) 98 F (36.7 C)   TempSrc: Oral Oral    SpO2: 97% 96% 93%   Weight:    75.1 kg  Height:        Intake/Output Summary (Last 24 hours) at 09/26/2018 1234 Last data filed at 09/26/2018 0900  Gross per 24 hour  Intake 980 ml  Output 300 ml  Net 680 ml   Filed Weights   09/24/18 1231 09/25/18 0502 09/26/18 0637  Weight: 76.7 kg 74.6 kg 75.1 kg    Examination:  Spoke with patient through video interpreter NAD No increased work of breathing ambulatory  .     Data Reviewed: Care during the described time interval was provided by me .  I have reviewed this patient's available data,  including medical history, events of note, physical examination, and all test results as part of my evaluation.   CBC: Recent Labs  Lab 09/22/18 1044 09/23/18 0537 09/24/18 0640 09/25/18 0229 09/26/18 0236  WBC 7.3 6.8 8.1 6.2 7.4  NEUTROABS 5.7  --  6.1  --  6.3  HGB 13.0 12.2* 12.0* 12.1* 11.8*  HCT 40.0 38.5* 36.4* 38.4* 33.5*  MCV 92.0 90.6 90.8 91.4 86.6  PLT PLATELET CLUMPS NOTED ON SMEAR, COUNT APPEARS DECREASED 100* 107* 121* 308*   Basic Metabolic Panel: Recent Labs  Lab 09/22/18 1044 09/23/18 0537 09/24/18 0640 09/25/18 0229 09/26/18 0236  NA 137 138 139 140 136  K 3.9 3.9 4.0 4.4 4.9  CL 102 102 103 103 104  CO2 23 25 25 27  21*  GLUCOSE 157* 133* 120* 79 149*  BUN 11 17 17 16 16   CREATININE 1.04 1.46* 1.28* 1.18 1.13  CALCIUM 9.0 8.5* 8.7* 9.0 8.8*  MG  --   --  2.0  --   --    GFR: Estimated Creatinine Clearance: 41 mL/min (by C-G formula based on SCr of 1.13 mg/dL). Liver Function Tests: Recent Labs  Lab 09/22/18 1044 09/23/18 0537 09/24/18 0640 09/25/18 0229 09/26/18 0236  AST 542* 226* 110* 70* 69*  ALT 496* 334* 217* 162* 121*  ALKPHOS 152* 179* 192* 183* 181*  BILITOT 4.3* 7.3* 3.3* 2.8* 2.3*  PROT 7.0 6.5 6.4* 6.6 6.1*  ALBUMIN 3.9 3.4* 3.1* 3.2* 3.0*   Recent Labs  Lab 09/22/18 1044  LIPASE 41   No results for input(s): AMMONIA in the last 168 hours. Coagulation Profile: Recent Labs  Lab 09/23/18 0537  INR 1.3*   Cardiac Enzymes: Recent Labs  Lab 09/22/18 1044 09/22/18 1339  TROPONINI <0.03 <0.03   BNP (last 3 results) No results for input(s): PROBNP in the last 8760 hours. HbA1C: No results for input(s): HGBA1C in the last 72 hours. CBG: No results for input(s): GLUCAP in the last 168 hours. Lipid Profile: No results for input(s): CHOL, HDL, LDLCALC, TRIG, CHOLHDL, LDLDIRECT in the last 72 hours. Thyroid Function Tests: No results for input(s): TSH, T4TOTAL, FREET4, T3FREE, THYROIDAB in the last 72 hours. Anemia  Panel: No results for input(s): VITAMINB12, FOLATE, FERRITIN, TIBC, IRON, RETICCTPCT in the last 72 hours. Urine analysis:    Component Value Date/Time   COLORURINE AMBER (A) 09/22/2018 1442   APPEARANCEUR CLEAR 09/22/2018 1442   LABSPEC 1.017 09/22/2018 1442   PHURINE 7.0 09/22/2018 1442   GLUCOSEU NEGATIVE 09/22/2018 1442   HGBUR NEGATIVE 09/22/2018 1442   BILIRUBINUR SMALL (A) 09/22/2018 1442   KETONESUR NEGATIVE 09/22/2018 1442   PROTEINUR NEGATIVE 09/22/2018 1442   UROBILINOGEN 1.0 04/14/2014 0116   NITRITE NEGATIVE 09/22/2018 1442   LEUKOCYTESUR NEGATIVE 09/22/2018 1442     ) Recent Results (from the past 240 hour(s))  Blood culture (routine x 2)     Status: None (Preliminary result)   Collection Time: 09/22/18  3:09 PM  Result Value Ref Range Status   Specimen  Description BLOOD SITE NOT SPECIFIED  Final   Special Requests   Final    BOTTLES DRAWN AEROBIC AND ANAEROBIC Blood Culture results may not be optimal due to an inadequate volume of blood received in culture bottles   Culture   Final    NO GROWTH 3 DAYS Performed at [facility], [place] [address] [place], [place] [address]    Report Status PENDING  Incomplete  Blood culture (routine x 2)     Status: None (Preliminary result)   Collection Time: 09/22/18  3:14 PM  Result Value Ref Range Status   Specimen Description BLOOD RIGHT ANTECUBITAL  Final   Special Requests   Final    BOTTLES DRAWN AEROBIC AND ANAEROBIC Blood Culture results may not be optimal due to an excessive volume of blood received in culture bottles   Culture   Final    NO GROWTH 3 DAYS Performed at [facility], [place] [address], [place], [place] [address]    Report Status PENDING  Incomplete  Novel Coronavirus, NAA (hospital order; send-out to ref lab)     Status: None   Collection Time: 09/22/18 11:19 PM  Result Value Ref Range Status   SARS-CoV-2, NAA NOT DETECTED NOT DETECTED Final    Comment: Negative (Not Detected) results do  not exclude infection caused by SARS CoV 2 and should not be used as the sole basis for treatment or other patient management decisions. Optimum specimen types and timing for peak viral levels during infections caused  by SARS CoV 2 have not been determined. Collection of multiple specimens (types and time points) from the same patient may be necessary to detect the virus. Improper specimen collection and handling, sequence variability underlying assay primers and or probes, or the presence of organisms in  quantities less than the limit of detection of the assay may lead to false negative results. Positive and negative predictive values of testing are highly dependent on prevalence. False negative results are more likely when prevalence of disease is high. Performed at [facility] (NOTE) The expected result is Negative (Not Detected). The SARS CoV 2 test is intended for the presumptive qualitative  detection of nucleic acid from SARS C oV 2 in upper and lower  respiratory specimens. Testing methodology is real time RT PCR. Test results must be correlated with clinical presentation and  evaluated in the context of other laboratory and epidemiologic data.  Test performance can be affected because the epidemiology and  clinical spectrum of infection caused by SARS CoV 2 is not fully  known. For example, the optimum types of specimens to collect and  when during the course of infection these specimens are most likely  to contain detectable viral RNA may not be known. This test has not been Food and Drug Administration (FDA) cleared or  approved and has been authorized by FDA under an Emergency Use  Authorization (EUA). The test is only authorized for the duration of  the declaration that circumstances exist justifying the authorization  of emergency use of in vitro diagnostic tests for detection and or  diagnosis of SARS CoV 2 under Section 564(b)(1) of the Act, 21 U.S.C.  section [phone]  3(b)(1), unless th e authorization is terminated or  revoked sooner. [facility] is certified under the  Clinical Laboratory Improvement Amendments of 1988 (CLIA), 42 U.S.C.  section [phone], to perform high complexity tests. Performed at [facility] [id number], [place], [place] [address] Laboratory Director: Loleta Books, MD Fact Sheet for Healthcare Providers  BankingDealers.co.za Fact Sheet for Patients  StrictlyIdeas.no    Coronavirus Source NASOPHARYNGEAL  Final    Comment: Performed at [facility], [place] [address], [place], [place] [address]  Respiratory Panel by PCR     Status: None   Collection Time: 09/23/18 11:12 AM  Result Value Ref Range Status   Adenovirus NOT DETECTED NOT DETECTED Final   Coronavirus 229E NOT DETECTED NOT DETECTED Final    Comment: (NOTE) The Coronavirus on the Respiratory Panel, DOES NOT test for the novel  Coronavirus (2019 nCoV)    Coronavirus HKU1 NOT DETECTED NOT DETECTED Final   Coronavirus NL63 NOT DETECTED NOT DETECTED Final   Coronavirus OC43 NOT DETECTED NOT DETECTED Final   Metapneumovirus NOT DETECTED NOT DETECTED Final   Rhinovirus / Enterovirus NOT DETECTED NOT DETECTED Final   Influenza A NOT DETECTED NOT DETECTED Final   Influenza B NOT DETECTED NOT DETECTED Final   Parainfluenza Virus 1 NOT DETECTED NOT DETECTED Final   Parainfluenza Virus 2 NOT DETECTED NOT DETECTED Final   Parainfluenza Virus 3 NOT DETECTED NOT DETECTED Final   Parainfluenza Virus 4 NOT DETECTED NOT DETECTED Final   Respiratory Syncytial Virus NOT DETECTED NOT DETECTED Final   Bordetella pertussis NOT DETECTED NOT DETECTED Final   Chlamydophila pneumoniae NOT DETECTED NOT DETECTED Final   Mycoplasma pneumoniae NOT DETECTED NOT DETECTED Final    Comment: Performed at [facility], [place]. [address], [place], [place] [address]          Radiology Studies: Dg Ercp Biliary & Pancreatic Ducts  Result Date: 09/25/2018 CLINICAL DATA:  Common bile duct calculus EXAM: ERCP TECHNIQUE: Multiple spot images obtained with the fluoroscopic device and submitted for interpretation post-procedure. FLUOROSCOPY TIME:  Fluoroscopy Time:  3 minutes and 17 seconds Radiation Exposure Index (if provided by the fluoroscopic device): Number of Acquired Spot Images: 0 COMPARISON:  None. FINDINGS: Images demonstrate cannulation of the common bile duct followed by balloon stone retrieval. IMPRESSION: See above. These images were submitted for radiologic interpretation only. Please see the procedural report for the amount of contrast and the fluoroscopy time utilized. Electronically Signed   By: Marybelle Killings M.D.   On: 09/25/2018 14:41        Scheduled Meds: . dutasteride  0.5 mg Oral Daily  . enoxaparin (LOVENOX) injection  40 mg Subcutaneous Q24H  . escitalopram  20 mg Oral Daily  . folic acid  1 mg Oral QPM  . pantoprazole  40 mg Oral Daily   Continuous Infusions: . sodium chloride 10 mL/hr at 09/22/18 2300  . piperacillin-tazobactam (ZOSYN)  IV 3.375 g (09/26/18 0602)     LOS: 4 days      Geradine Girt, DO [facility]   If 7PM-7AM, please contact night-coverage [email or web address] Password [facility] 09/26/2018, 12:34 PM

## 2018-09-26 NOTE — Progress Notes (Addendum)
Granddaughter - Dr Loma Messing [phone]  She is very concerned about the patient having surgery tomorrow as she states he is not consentable. I assured pt we were using interpreter to be sure of understanding and that her grandfather is able to consent for himself and she insisted that he is not consentable and so his family must be contacted urgently.   I paged the surgery number in consult and spoke with PA Margie Billet who said they were going to be in contact with the family.  Granddaughter confirmed that [name], who is in the chart as contact, is her mother and is the patient's MPoA (no documents on file, and patient is oriented and alert).

## 2018-09-27 ENCOUNTER — Encounter (HOSPITAL_COMMUNITY): Payer: Self-pay | Admitting: Gastroenterology

## 2018-09-27 SURGERY — LAPAROSCOPIC CHOLECYSTECTOMY
Anesthesia: General

## 2018-09-28 DIAGNOSIS — B372 Candidiasis of skin and nail: Secondary | ICD-10-CM | POA: Diagnosis not present

## 2018-09-28 DIAGNOSIS — M069 Rheumatoid arthritis, unspecified: Secondary | ICD-10-CM | POA: Diagnosis not present

## 2018-09-28 DIAGNOSIS — K7460 Unspecified cirrhosis of liver: Secondary | ICD-10-CM | POA: Diagnosis not present

## 2018-09-28 DIAGNOSIS — I10 Essential (primary) hypertension: Secondary | ICD-10-CM | POA: Diagnosis not present

## 2018-09-28 DIAGNOSIS — Z79899 Other long term (current) drug therapy: Secondary | ICD-10-CM | POA: Diagnosis not present

## 2018-09-28 DIAGNOSIS — K8020 Calculus of gallbladder without cholecystitis without obstruction: Secondary | ICD-10-CM | POA: Diagnosis not present

## 2018-09-28 LAB — CULTURE, BLOOD (ROUTINE X 2)
Culture: NO GROWTH
Culture: NO GROWTH

## 2018-10-05 DIAGNOSIS — Z79899 Other long term (current) drug therapy: Secondary | ICD-10-CM | POA: Diagnosis not present

## 2018-10-05 DIAGNOSIS — K7460 Unspecified cirrhosis of liver: Secondary | ICD-10-CM | POA: Diagnosis not present

## 2018-10-09 ENCOUNTER — Emergency Department (HOSPITAL_COMMUNITY): Payer: Medicare Other

## 2018-10-09 ENCOUNTER — Encounter (HOSPITAL_COMMUNITY): Payer: Self-pay | Admitting: Emergency Medicine

## 2018-10-09 ENCOUNTER — Inpatient Hospital Stay (HOSPITAL_COMMUNITY)
Admission: EM | Admit: 2018-10-09 | Discharge: 2018-10-12 | DRG: 418 | Disposition: A | Discharge status: Home Health | Payer: Medicare Other | Attending: Internal Medicine | Admitting: Internal Medicine

## 2018-10-09 ENCOUNTER — Other Ambulatory Visit: Payer: Self-pay

## 2018-10-09 DIAGNOSIS — Z952 Presence of prosthetic heart valve: Secondary | ICD-10-CM | POA: Diagnosis not present

## 2018-10-09 DIAGNOSIS — Z66 Do not resuscitate: Secondary | ICD-10-CM | POA: Diagnosis not present

## 2018-10-09 DIAGNOSIS — K5900 Constipation, unspecified: Secondary | ICD-10-CM | POA: Diagnosis not present

## 2018-10-09 DIAGNOSIS — Z87442 Personal history of urinary calculi: Secondary | ICD-10-CM

## 2018-10-09 DIAGNOSIS — H9190 Unspecified hearing loss, unspecified ear: Secondary | ICD-10-CM | POA: Diagnosis present

## 2018-10-09 DIAGNOSIS — N183 Chronic kidney disease, stage 3 unspecified: Secondary | ICD-10-CM | POA: Diagnosis present

## 2018-10-09 DIAGNOSIS — Z79890 Hormone replacement therapy (postmenopausal): Secondary | ICD-10-CM

## 2018-10-09 DIAGNOSIS — Z789 Other specified health status: Secondary | ICD-10-CM | POA: Diagnosis not present

## 2018-10-09 DIAGNOSIS — F32A Depression, unspecified: Secondary | ICD-10-CM | POA: Diagnosis present

## 2018-10-09 DIAGNOSIS — Z7982 Long term (current) use of aspirin: Secondary | ICD-10-CM

## 2018-10-09 DIAGNOSIS — I2510 Atherosclerotic heart disease of native coronary artery without angina pectoris: Secondary | ICD-10-CM | POA: Diagnosis present

## 2018-10-09 DIAGNOSIS — R001 Bradycardia, unspecified: Secondary | ICD-10-CM | POA: Diagnosis not present

## 2018-10-09 DIAGNOSIS — K8040 Calculus of bile duct with cholecystitis, unspecified, without obstruction: Secondary | ICD-10-CM | POA: Diagnosis not present

## 2018-10-09 DIAGNOSIS — M0609 Rheumatoid arthritis without rheumatoid factor, multiple sites: Secondary | ICD-10-CM | POA: Diagnosis present

## 2018-10-09 DIAGNOSIS — N281 Cyst of kidney, acquired: Secondary | ICD-10-CM | POA: Diagnosis present

## 2018-10-09 DIAGNOSIS — K7469 Other cirrhosis of liver: Secondary | ICD-10-CM | POA: Diagnosis not present

## 2018-10-09 DIAGNOSIS — Z953 Presence of xenogenic heart valve: Secondary | ICD-10-CM

## 2018-10-09 DIAGNOSIS — K8064 Calculus of gallbladder and bile duct with chronic cholecystitis without obstruction: Secondary | ICD-10-CM | POA: Diagnosis present

## 2018-10-09 DIAGNOSIS — I130 Hypertensive heart and chronic kidney disease with heart failure and stage 1 through stage 4 chronic kidney disease, or unspecified chronic kidney disease: Secondary | ICD-10-CM | POA: Diagnosis not present

## 2018-10-09 DIAGNOSIS — E785 Hyperlipidemia, unspecified: Secondary | ICD-10-CM | POA: Diagnosis present

## 2018-10-09 DIAGNOSIS — N400 Enlarged prostate without lower urinary tract symptoms: Secondary | ICD-10-CM | POA: Diagnosis present

## 2018-10-09 DIAGNOSIS — I5032 Chronic diastolic (congestive) heart failure: Secondary | ICD-10-CM | POA: Diagnosis present

## 2018-10-09 DIAGNOSIS — K429 Umbilical hernia without obstruction or gangrene: Secondary | ICD-10-CM | POA: Diagnosis not present

## 2018-10-09 DIAGNOSIS — K219 Gastro-esophageal reflux disease without esophagitis: Secondary | ICD-10-CM | POA: Diagnosis present

## 2018-10-09 DIAGNOSIS — Z87891 Personal history of nicotine dependence: Secondary | ICD-10-CM

## 2018-10-09 DIAGNOSIS — R1011 Right upper quadrant pain: Secondary | ICD-10-CM | POA: Diagnosis not present

## 2018-10-09 DIAGNOSIS — Z888 Allergy status to other drugs, medicaments and biological substances status: Secondary | ICD-10-CM

## 2018-10-09 DIAGNOSIS — I351 Nonrheumatic aortic (valve) insufficiency: Secondary | ICD-10-CM | POA: Diagnosis present

## 2018-10-09 DIAGNOSIS — Z419 Encounter for procedure for purposes other than remedying health state, unspecified: Secondary | ICD-10-CM

## 2018-10-09 DIAGNOSIS — Z79899 Other long term (current) drug therapy: Secondary | ICD-10-CM

## 2018-10-09 DIAGNOSIS — I10 Essential (primary) hypertension: Secondary | ICD-10-CM | POA: Diagnosis not present

## 2018-10-09 DIAGNOSIS — R109 Unspecified abdominal pain: Secondary | ICD-10-CM | POA: Diagnosis present

## 2018-10-09 DIAGNOSIS — F329 Major depressive disorder, single episode, unspecified: Secondary | ICD-10-CM | POA: Diagnosis present

## 2018-10-09 DIAGNOSIS — N182 Chronic kidney disease, stage 2 (mild): Secondary | ICD-10-CM | POA: Diagnosis present

## 2018-10-09 DIAGNOSIS — K8010 Calculus of gallbladder with chronic cholecystitis without obstruction: Secondary | ICD-10-CM | POA: Diagnosis not present

## 2018-10-09 DIAGNOSIS — K8020 Calculus of gallbladder without cholecystitis without obstruction: Secondary | ICD-10-CM | POA: Diagnosis not present

## 2018-10-09 LAB — COMPREHENSIVE METABOLIC PANEL
ALT: 20 U/L (ref 0–44)
AST: 21 U/L (ref 15–41)
Albumin: 3.6 g/dL (ref 3.5–5.0)
Alkaline Phosphatase: 88 U/L (ref 38–126)
Anion gap: 9 (ref 5–15)
BUN: 14 mg/dL (ref 8–23)
CO2: 27 mmol/L (ref 22–32)
Calcium: 9.2 mg/dL (ref 8.9–10.3)
Chloride: 100 mmol/L (ref 98–111)
Creatinine, Ser: 1.2 mg/dL (ref 0.61–1.24)
GFR calc Af Amer: >60 mL/min (ref >60)
GFR calc non Af Amer: 54 mL/min — ABNORMAL LOW (ref >60)
Glucose, Bld: 150 mg/dL — ABNORMAL HIGH (ref 70–99)
Potassium: 4.8 mmol/L (ref 3.5–5.1)
Sodium: 136 mmol/L (ref 135–145)
Total Bilirubin: 1.1 mg/dL (ref 0.3–1.2)
Total Protein: 6.9 g/dL (ref 6.5–8.1)

## 2018-10-09 LAB — APTT: aPTT: 34 seconds (ref 24–36)

## 2018-10-09 LAB — URINALYSIS, ROUTINE W REFLEX MICROSCOPIC
Bilirubin Urine: NEGATIVE
Glucose, UA: NEGATIVE mg/dL
Hgb urine dipstick: NEGATIVE
Ketones, ur: NEGATIVE mg/dL
Leukocytes,Ua: NEGATIVE
Nitrite: NEGATIVE
Protein, ur: NEGATIVE mg/dL
Specific Gravity, Urine: 1.012 (ref 1.005–1.030)
pH: 6 (ref 5.0–8.0)

## 2018-10-09 LAB — CBC
HCT: 39 % (ref 39.0–52.0)
Hemoglobin: 12.4 g/dL — ABNORMAL LOW (ref 13.0–17.0)
MCH: 29.1 pg (ref 26.0–34.0)
MCHC: 31.8 g/dL (ref 30.0–36.0)
MCV: 91.5 fL (ref 80.0–100.0)
Platelets: 207 10*3/uL (ref 150–400)
RBC: 4.26 MIL/uL (ref 4.22–5.81)
RDW: 12.2 % (ref 11.5–15.5)
WBC: 8.6 10*3/uL (ref 4.0–10.5)
nRBC: 0 % (ref 0.0–0.2)

## 2018-10-09 LAB — PROTIME-INR
INR: 1.1 (ref 0.8–1.2)
Prothrombin Time: 13.8 seconds (ref 11.4–15.2)

## 2018-10-09 LAB — LIPASE, BLOOD: Lipase: 39 U/L (ref 11–51)

## 2018-10-09 LAB — LACTIC ACID, PLASMA: Lactic Acid, Venous: 1 mmol/L (ref 0.5–1.9)

## 2018-10-09 LAB — BRAIN NATRIURETIC PEPTIDE: B Natriuretic Peptide: 52.5 pg/mL (ref 0.0–100.0)

## 2018-10-09 MED ORDER — OMEGA-3-ACID ETHYL ESTERS 1 G PO CAPS
1.0000 g | ORAL_CAPSULE | Freq: Every day | ORAL | Status: DC
  Administered 2018-10-10 – 2018-10-12 (×2): 1 g via ORAL
  Filled 2018-10-09 (×2): qty 1

## 2018-10-09 MED ORDER — SENNOSIDES-DOCUSATE SODIUM 8.6-50 MG PO TABS: 1.0000 | ORAL_TABLET | Freq: Every evening | ORAL | Status: DC | PRN

## 2018-10-09 MED ORDER — ONDANSETRON HCL 4 MG PO TABS: 4.0000 mg | ORAL_TABLET | Freq: Four times a day (QID) | ORAL | Status: DC | PRN

## 2018-10-09 MED ORDER — VITAMIN D 25 MCG (1000 UNIT) PO TABS
1000.0000 [IU] | ORAL_TABLET | Freq: Every day | ORAL | Status: DC
  Administered 2018-10-10: 1000 [IU] via ORAL
  Filled 2018-10-09: qty 1

## 2018-10-09 MED ORDER — ACETAMINOPHEN 325 MG PO TABS
650.0000 mg | ORAL_TABLET | Freq: Four times a day (QID) | ORAL | Status: DC | PRN
  Administered 2018-10-10: 22:00:00 650 mg via ORAL
  Filled 2018-10-09 (×2): qty 2

## 2018-10-09 MED ORDER — ACETAMINOPHEN 650 MG RE SUPP: 650.0000 mg | Freq: Four times a day (QID) | RECTAL | Status: DC | PRN

## 2018-10-09 MED ORDER — ESCITALOPRAM OXALATE 20 MG PO TABS
20.0000 mg | ORAL_TABLET | Freq: Every day | ORAL | Status: DC
  Administered 2018-10-09 – 2018-10-11 (×3): 20 mg via ORAL
  Filled 2018-10-09 (×2): qty 2
  Filled 2018-10-09: qty 1

## 2018-10-09 MED ORDER — ONDANSETRON HCL 4 MG/2ML IJ SOLN
4.0000 mg | Freq: Once | INTRAMUSCULAR | Status: AC
  Administered 2018-10-09: 4 mg via INTRAVENOUS
  Filled 2018-10-09: qty 2

## 2018-10-09 MED ORDER — ASPIRIN EC 81 MG PO TBEC
81.0000 mg | DELAYED_RELEASE_TABLET | Freq: Every day | ORAL | Status: DC
  Administered 2018-10-10 – 2018-10-12 (×2): 81 mg via ORAL
  Filled 2018-10-09 (×2): qty 1

## 2018-10-09 MED ORDER — SODIUM CHLORIDE 0.9 % IV BOLUS
500.0000 mL | Freq: Once | INTRAVENOUS | Status: AC
  Administered 2018-10-09: 18:00:00 500 mL via INTRAVENOUS

## 2018-10-09 MED ORDER — FOLIC ACID 1 MG PO TABS
1.0000 mg | ORAL_TABLET | Freq: Every evening | ORAL | Status: DC
  Administered 2018-10-09 – 2018-10-10 (×2): 1 mg via ORAL
  Filled 2018-10-09 (×2): qty 1

## 2018-10-09 MED ORDER — VITAMIN C 500 MG PO TABS
1000.0000 mg | ORAL_TABLET | Freq: Every day | ORAL | Status: DC
  Administered 2018-10-10 – 2018-10-12 (×2): 1000 mg via ORAL
  Filled 2018-10-09 (×2): qty 2

## 2018-10-09 MED ORDER — HYDRALAZINE HCL 20 MG/ML IJ SOLN: 5.0000 mg | INTRAMUSCULAR | Status: DC | PRN

## 2018-10-09 MED ORDER — FAMOTIDINE IN NACL 20-0.9 MG/50ML-% IV SOLN
20.0000 mg | Freq: Once | INTRAVENOUS | Status: AC
  Administered 2018-10-09: 20 mg via INTRAVENOUS
  Filled 2018-10-09: qty 50

## 2018-10-09 MED ORDER — MELATONIN 3 MG PO TABS
6.0000 mg | ORAL_TABLET | Freq: Every evening | ORAL | Status: DC | PRN
  Administered 2018-10-09: 6 mg via ORAL
  Filled 2018-10-09 (×2): qty 2

## 2018-10-09 MED ORDER — DUTASTERIDE 0.5 MG PO CAPS
0.5000 mg | ORAL_CAPSULE | Freq: Every day | ORAL | Status: DC
  Administered 2018-10-10 – 2018-10-12 (×2): 0.5 mg via ORAL
  Filled 2018-10-09 (×3): qty 1

## 2018-10-09 MED ORDER — VITAMIN E 180 MG (400 UNIT) PO CAPS
400.0000 [IU] | ORAL_CAPSULE | Freq: Every day | ORAL | Status: DC
  Administered 2018-10-10: 11:00:00 400 [IU] via ORAL
  Filled 2018-10-09 (×3): qty 1

## 2018-10-09 MED ORDER — LOSARTAN POTASSIUM 25 MG PO TABS
25.0000 mg | ORAL_TABLET | Freq: Every day | ORAL | Status: DC
  Administered 2018-10-10 – 2018-10-12 (×2): 25 mg via ORAL
  Filled 2018-10-09 (×2): qty 1

## 2018-10-09 MED ORDER — MORPHINE SULFATE (PF) 2 MG/ML IV SOLN: 0.5000 mg | INTRAVENOUS | Status: DC | PRN

## 2018-10-09 MED ORDER — ONDANSETRON HCL 4 MG/2ML IJ SOLN: 4.0000 mg | Freq: Four times a day (QID) | INTRAMUSCULAR | Status: DC | PRN

## 2018-10-09 MED ORDER — OXYCODONE-ACETAMINOPHEN 5-325 MG PO TABS
1.0000 | ORAL_TABLET | Freq: Four times a day (QID) | ORAL | Status: DC | PRN
  Administered 2018-10-12: 1 via ORAL
  Filled 2018-10-09: qty 1

## 2018-10-09 MED ORDER — FENTANYL CITRATE (PF) 100 MCG/2ML IJ SOLN
25.0000 ug | Freq: Once | INTRAMUSCULAR | Status: AC
  Administered 2018-10-09: 18:00:00 25 ug via INTRAVENOUS
  Filled 2018-10-09: qty 2

## 2018-10-09 NOTE — ED Notes (Signed)
Patient transported to Ultrasound 

## 2018-10-09 NOTE — ED Notes (Signed)
Report attempted 

## 2018-10-09 NOTE — ED Notes (Signed)
Pt's dtr [name] [phone]. Dtr is requesting to be Pt;s interpreter. Dtr does not want a hospital interpreter used for Pt. Dtr states there is a lack of interpretation between Pt's [place] and hospital's interpreter

## 2018-10-09 NOTE — Consult Note (Signed)
Reason for Consult:RUQ abdominal pain Referring Physician: Aliou Mealey is an 85 y.o. male.  HPI: [name] is an 85yo male PMH HTN, BPH, GERD, rheumatoid arthritis, diastolic CHF (EF 67-67% 20/94/70), aortic stenosis s/p TAVR 03/27/2018, who was admitted to [facility] 4/11 complaining of acute onset shortness of breath, nausea, vomiting, and chest/epigastric pain. States that he had never had pain like this before. He was found to have elevated LFTs with AST 542, ALT 496, AP 152, and Tbili 4.3. MRCP revealed choledocholithiasis. GI was consulted and performed ERCP 4/14 with complete removal of stones with biliary sphincterotomy and balloon extraction.   CCS saw the patient in consultation on 09/26/18 and recommended lap chole, however the patient's daughter and granddaughter did not want him to have surgery due to [place] visitor restrictions and the language barrier.  He presents now with worsening symptoms over the last couple of days of RUQ pain.  Some nausea, no vomiting.  Poor appetite.  No sign of jaundice.  Past Medical History:  Diagnosis Date  . BPH (benign prostatic hypertrophy)   . Depression   . Diverticulosis of colon   . GERD (gastroesophageal reflux disease)   . Gilbert's syndrome   . History of kidney stones   . HLD (hyperlipidemia)   . HOH (hard of hearing)    refuses to wear his Hearing Aid  . Hypertension   . Inguinal hernia    right  . Internal hemorrhoid   . Normal coronary arteries    a. Cath 07/2010: normal coronaries. Felt to have noncardiac CP/SOB at that time possibly r/t anxiety.  . RA (rheumatoid arthritis) ([place])    bilateral hands/ wrist--  seronegative  . S/P TAVR (transcatheter aortic valve replacement) 03/27/2018   23 mm Edwards Sapien 3 transcatheter heart valve placed via percutaneous right transfemoral approach   . Severe aortic stenosis   . Thyroid nodule    noted 11-2009  . Wears dentures     Past Surgical History:  Procedure Laterality Date   . CARDIAC CATHETERIZATION  07-19-2010  dr Tressia Miners turner   normal coronary arteries,  ef 60%,  moderate aortic stenosis- gradient 33mmHg, normal right heart pressure  . CARDIOVASCULAR STRESS TEST  05/ 2011   dr Marlou Porch   normal low risk perfusion study  . CATARACT EXTRACTION W/ INTRAOCULAR LENS  IMPLANT, BILATERAL  2013  . CATARACT EXTRACTION, BILATERAL    . COLONOSCOPY  2010  approx  . ERCP N/A 09/25/2018   Procedure: ENDOSCOPIC RETROGRADE CHOLANGIOPANCREATOGRAPHY (ERCP);  Surgeon: Clarene Essex, MD;  Location: [place];  Service: Endoscopy;  Laterality: N/A;  . INGUINAL HERNIA REPAIR Right 08/23/2018   Procedure: OPEN RIGHT INGUINAL HERNIA REPAIR WITH MESH;  Surgeon: Armandina Gemma, MD;  Location: [facility];  Service: General;  Laterality: Right;  . INTRAOPERATIVE TRANSTHORACIC ECHOCARDIOGRAM N/A 03/27/2018   Procedure: INTRAOPERATIVE TRANSTHORACIC ECHOCARDIOGRAM;  Surgeon: Burnell Blanks, MD;  Location: [place];  Service: Open Heart Surgery;  Laterality: N/A;  . REMOVAL OF STONES  09/25/2018   Procedure: REMOVAL OF STONES;  Surgeon: Clarene Essex, MD;  Location: [place];  Service: Endoscopy;;  . RIGHT/LEFT HEART CATH AND CORONARY ANGIOGRAPHY N/A 02/06/2018   Procedure: RIGHT/LEFT HEART CATH AND CORONARY ANGIOGRAPHY;  Surgeon: Burnell Blanks, MD;  Location: [facility];  Service: Cardiovascular;  Laterality: N/A;  . SPHINCTEROTOMY  09/25/2018   Procedure: SPHINCTEROTOMY;  Surgeon: Clarene Essex, MD;  Location: [place];  Service: Endoscopy;;  . THYROID LOBECTOMY Right 05/05/2015  Procedure: RIGHT THYROID LOBECTOMY;  Surgeon: Armandina Gemma, MD;  Location: [place];  Service: General;  Laterality: Right;  . TRANSANAL HEMORRHOIDAL DEARTERIALIZATION N/A 04/09/2014   Procedure: TRANSANAL HEMORRHOIDAL DEARTERIALIZATION OF INTERNAL HEMORROIDS;  Surgeon: Leighton Ruff, MD;  Location: [facility]  Service: General;  Laterality: N/A;  . TRANSCATHETER AORTIC VALVE  REPLACEMENT, TRANSFEMORAL  03/27/2018  . TRANSCATHETER AORTIC VALVE REPLACEMENT, TRANSFEMORAL N/A 03/27/2018   Procedure: TRANSCATHETER AORTIC VALVE REPLACEMENT, TRANSFEMORAL. Edwards SAPIEN 3 Transcatheter Heart Valve 54mm.;  Surgeon: Burnell Blanks, MD;  Location: [place];  Service: Open Heart Surgery;  Laterality: N/A;  . TRANSTHORACIC ECHOCARDIOGRAM  11-26-2013   moderate focal basal and mild LVH/  ef 00-93%/  grade I diastolic dysfunction/ mild LAE/  moderate calcification with stenosis AV with mild regurg,  gradients 35 abd 58 mmHg /  mild TR    Family History  Problem Relation Age of Onset  . Pulmonary embolism Mother 50       caused by complications of surgery  . CVA Father   . Diabetes Brother   . Diabetes Brother   . Breast cancer Sister     Social History:  reports that he quit smoking about 37 years ago. His smoking use included cigarettes. He started smoking about 45 years ago. He quit after 40.00 years of use. He has never used smokeless tobacco. He reports current alcohol use. He reports that he does not use drugs.  Allergies:  Allergies  Allergen Reactions  . Ativan [Lorazepam] Other (See Comments)    hallucinations  . Phenergan [Promethazine Hcl] Hypertension  . Eggs Or Egg-Derived Products Rash    Small rash after eating for several days in a row    Medications:  Prior to Admission medications   Medication Sig Start Date End Date Taking? Authorizing Provider  Ascorbic Acid (VITAMIN C) 1000 MG tablet Take 1,000 mg by mouth daily after lunch.   Yes [provider]  aspirin 81 MG tablet Take 1 tablet (81 mg total) by mouth daily. Patient taking differently: Take 81 mg by mouth daily.  06/05/18  Yes Barrett, Evelene Croon, PA-C  cholecalciferol (VITAMIN D3) 25 MCG (1000 UT) tablet Take 1,000 Units by mouth daily.   Yes [provider]  dutasteride (AVODART) 0.5 MG capsule Take 0.5 mg by mouth daily.   Yes [provider]  escitalopram  (LEXAPRO) 20 MG tablet Take 1 tablet (20 mg total) by mouth daily. Patient taking differently: Take 20 mg by mouth at bedtime.  09/06/16  Yes Angiulli, Lavon Paganini, PA-C  folic acid (FOLVITE) 1 MG tablet Take 1 mg by mouth every evening.  01/27/15  Yes [provider]  furosemide (LASIX) 20 MG tablet Take 20 mg by mouth.   Yes [provider]  losartan (COZAAR) 25 MG tablet Take 25 mg by mouth daily.  01/21/18  Yes [provider]  Melatonin 5 MG TABS Take 5 mg by mouth at bedtime as needed (for sleep).    Yes [provider]  Omega-3 Fatty Acids (FISH OIL) 1000 MG CAPS Take 1,000 mg by mouth daily.   Yes [provider]  vitamin E 400 UNIT capsule Take 400 Units by mouth daily after lunch.   Yes [provider]  amoxicillin-clavulanate (AUGMENTIN) 875-125 MG tablet Take 1 tablet by mouth every 12 (twelve) hours. Patient not taking: Reported on 10/09/2018 09/26/18   Geradine Girt, DO  pantoprazole (PROTONIX) 40 MG tablet Take 1 tablet (40 mg total)  by mouth daily. Patient not taking: Reported on 10/09/2018 09/27/18   Geradine Girt, DO  traMADol (ULTRAM) 50 MG tablet Take 1-2 tablets (50-100 mg total) by mouth every 6 (six) hours as needed. Patient not taking: Reported on 10/09/2018 08/23/18   Armandina Gemma, MD     Results for orders placed or performed during the hospital encounter of 10/09/18 (from the past 48 hour(s))  CBC     Status: Abnormal   Collection Time: 10/09/18  5:36 PM  Result Value Ref Range   WBC 8.6 4.0 - 10.5 K/uL   RBC 4.26 4.22 - 5.81 MIL/uL   Hemoglobin 12.4 (L) 13.0 - 17.0 g/dL   HCT 39.0 39.0 - 52.0 %   MCV 91.5 80.0 - 100.0 fL   MCH 29.1 26.0 - 34.0 pg   MCHC 31.8 30.0 - 36.0 g/dL   RDW 12.2 11.5 - 15.5 %   Platelets 207 150 - 400 K/uL   nRBC 0.0 0.0 - 0.2 %    Comment: Performed at [facility], [place] [address] [place], [place] [address]  Comprehensive metabolic panel     Status: Abnormal   Collection Time:  10/09/18  5:36 PM  Result Value Ref Range   Sodium 136 135 - 145 mmol/L   Potassium 4.8 3.5 - 5.1 mmol/L   Chloride 100 98 - 111 mmol/L   CO2 27 22 - 32 mmol/L   Glucose, Bld 150 (H) 70 - 99 mg/dL   BUN 14 8 - 23 mg/dL   Creatinine, Ser 1.20 0.61 - 1.24 mg/dL   Calcium 9.2 8.9 - 10.3 mg/dL   Total Protein 6.9 6.5 - 8.1 g/dL   Albumin 3.6 3.5 - 5.0 g/dL   AST 21 15 - 41 U/L   ALT 20 0 - 44 U/L   Alkaline Phosphatase 88 38 - 126 U/L   Total Bilirubin 1.1 0.3 - 1.2 mg/dL   GFR calc non Af Amer 54 (L) >60 mL/min   GFR calc Af Amer >60 >60 mL/min   Anion gap 9 5 - 15    Comment: Performed at [facility] [address] [place], [place] [address]  Lipase, blood     Status: None   Collection Time: 10/09/18  5:36 PM  Result Value Ref Range   Lipase 39 11 - 51 U/L    Comment: Performed at [facility] [address], [place], [place] [address]  Lactic acid, plasma     Status: None   Collection Time: 10/09/18  5:36 PM  Result Value Ref Range   Lactic Acid, Venous 1.0 0.5 - 1.9 mmol/L    Comment: Performed at [facility] [address] [place], [place] [address]  Urinalysis, Routine w reflex microscopic     Status: None   Collection Time: 10/09/18  5:36 PM  Result Value Ref Range   Color, Urine YELLOW YELLOW   APPearance CLEAR CLEAR   Specific Gravity, Urine 1.012 1.005 - 1.030   pH 6.0 5.0 - 8.0   Glucose, UA NEGATIVE NEGATIVE mg/dL   Hgb urine dipstick NEGATIVE NEGATIVE   Bilirubin Urine NEGATIVE NEGATIVE   Ketones, ur NEGATIVE NEGATIVE mg/dL   Protein, ur NEGATIVE NEGATIVE mg/dL   Nitrite NEGATIVE NEGATIVE   Leukocytes,Ua NEGATIVE NEGATIVE    Comment: Performed at [facility] [address], [place], [place] [address]    US Abdomen Limited Ruq  Result Date: 10/09/2018 CLINICAL DATA:  Initial  evaluation for acute right upper quadrant pain. EXAM: ULTRASOUND ABDOMEN LIMITED RIGHT UPPER QUADRANT COMPARISON:  Prior MRI from 09/24/2018 FINDINGS:  Gallbladder: Nonshadowing layering echogenic material within the gallbladder lumen most consistent with sludge. No frank shadowing cholelithiasis. Gallbladder wall measures within normal limits of 1.8 mm. No free pericholecystic fluid. No sonographic Murphy sign elicited on exam. Common bile duct: Diameter: 5.5 mm.  No choledocholithiasis. Liver: No focal lesion identified. Within normal limits in parenchymal echogenicity. Portal vein is patent on color Doppler imaging with normal direction of blood flow towards the liver. Incidental note made of 2 simple right renal cysts measuring 1.6 x 2.1 x 1.9 cm and 3.7 x 3.5 x 2.9 cm. IMPRESSION: 1. Gallbladder sludge without frank cholelithiasis. No sonographic features to suggest acute cholecystitis. 2. No biliary dilatation or choledocholithiasis by ultrasound. 3. Incidental simple right renal cysts as above. Electronically Signed   By: Jeannine Boga M.D.   On: 10/09/2018 18:42    Review of Systems  Constitutional: Negative for weight loss.  HENT: Negative for ear discharge, ear pain, hearing loss and tinnitus.   Eyes: Negative for blurred vision, double vision, photophobia and pain.  Respiratory: Negative for cough, sputum production and shortness of breath.   Cardiovascular: Negative for chest pain.  Gastrointestinal: Positive for abdominal pain and nausea. Negative for vomiting.  Genitourinary: Negative for dysuria, flank pain, frequency and urgency.  Musculoskeletal: Negative for back pain, falls, joint pain, myalgias and neck pain.  Neurological: Negative for dizziness, tingling, sensory change, focal weakness, loss of consciousness and headaches.  Endo/Heme/Allergies: Does not bruise/bleed easily.  Psychiatric/Behavioral: Negative for depression, memory loss and substance abuse. The patient is not nervous/anxious.    Blood pressure 125/80, pulse (!) 55, temperature 98.4 F (36.9 C), temperature source Oral, resp. rate 20, weight 75.1 kg, SpO2  97 %. Physical Exam WDWN in NAD Eyes:  Pupils equal, round; sclera anicteric HENT:  Oral mucosa moist; good dentition  Neck:  No masses palpated, no thyromegaly Lungs:  CTA bilaterally; normal respiratory effort CV:  Regular rate and rhythm; no murmurs; extremities well-perfused with no edema Abd:  +bowel sounds, soft,  Mildly tender in RUQ, no palpable organomegaly; no palpable hernias Skin:  Warm, dry; no sign of jaundice Psychiatric - alert and oriented x 4; calm mood and affect  Assessment/Plan:  HTN BPH GERD CHF RA Aortic stenosis s/p TAVR  Symptomatic cholelithiasis - no sign of choledocholithiasis or pancreatitis.    Needs cardiac clearance NPO p MN Possible surgery tomorrow.   Imogene Burn Yeng Frankie 10/09/2018, 7:28 PM

## 2018-10-09 NOTE — H&P (Signed)
History and Physical    [name] CXK:[id number] DOB: July 11, 1927 DOA: 10/09/2018  Referring MD/NP/PA:   PCP: Lajean Manes, MD   Patient coming from:  The patient is coming from home.  At baseline, pt is independent for most of ADL.        Chief Complaint: Abdominal pain  HPI: [name] is a 85 y.o. male with medical history significant of HTN, BPH, GERD, rheumatoid arthritis,dCHF(EF 60-65% 04/25/18), aortic stenosis s/p TAVR 03/27/2018, hyperlipidemia, depression, Gilbert syndrome, hard of hearing, CKD-III, who presents with abdominal pain.  Pt has HOH and is a poor historian. I called family and spoke with her daughter and granddaughter to have collected more information from them. Patient was recently hospitalized from 4/11-4/15 due to choledocholithiasis. He had MRCP revealedcholedocholithiasis. GI performed ERCP 4/14 with complete removalof stones withbiliary sphincterotomy and balloon extraction.  General surgeon was consulted, recommended lap chole, however the patient's family did not want him to have surgery due to [place] restrictions and the language barrier. Per family, patient developed abdominal pain again for more than 24 hours, which is located right upper quadrant, moderate, sharp, nonradiating.  Patient has nausea, and dry heaves no vomiting or diarrhea.  Patient does not have fever or chills.  He does not have chest pain, shortness of breath, cough.  No symptoms of UTI or unilateral weakness.  ED Course: pt was found to have WBC 8.6, lactic acid 1.0, lipase 39, normal LFT, negative urinalysis, stable renal function, temperature normal, heart rate 55, oxygen saturation 93-98% on room air.  Patient is admitted to telemetry bed as inpatient.  General surgeon, Dr. Georgette Dover was consulted.  RUQ-US: 1. Gallbladder sludge without frank cholelithiasis. No sonographic features to suggest acute cholecystitis. 2. No biliary dilatation or choledocholithiasis by ultrasound.  3. Incidental simple right renal cysts as above.  Review of Systems:   General: no fevers, chills, no body weight gain, has poor appetite, has fatigue HEENT: no blurry vision, hearing changes or sore throat Respiratory: no dyspnea, coughing, wheezing CV: no chest pain, no palpitations GI: has nausea, abdominal pain, no diarrhea, has mild constipation GU: no dysuria, burning on urination, increased urinary frequency, hematuria  Ext: no leg edema Neuro: no unilateral weakness, numbness, or tingling, no vision change or hearing loss Skin: no rash, no skin tear. MSK: No muscle spasm, no deformity, no limitation of range of movement in spin Heme: No easy bruising.  Travel history: No recent long distant travel.  Allergy:  Allergies  Allergen Reactions  . Ativan [Lorazepam] Other (See Comments)    hallucinations  . Phenergan [Promethazine Hcl] Hypertension  . Eggs Or Egg-Derived Products Rash    Small rash after eating for several days in a row    Past Medical History:  Diagnosis Date  . BPH (benign prostatic hypertrophy)   . Depression   . Diverticulosis of colon   . GERD (gastroesophageal reflux disease)   . Gilbert's syndrome   . History of kidney stones   . HLD (hyperlipidemia)   . HOH (hard of hearing)    refuses to wear his Hearing Aid  . Hypertension   . Inguinal hernia    right  . Internal hemorrhoid   . Normal coronary arteries    a. Cath 07/2010: normal coronaries. Felt to have noncardiac CP/SOB at that time possibly r/t anxiety.  . RA (rheumatoid arthritis) ([place])    bilateral hands/ wrist--  seronegative  . S/P TAVR (transcatheter aortic valve replacement) 03/27/2018   23 mm  Edwards Sapien 3 transcatheter heart valve placed via percutaneous right transfemoral approach   . Severe aortic stenosis   . Thyroid nodule    noted 11-2009  . Wears dentures     Past Surgical History:  Procedure Laterality Date  . CARDIAC CATHETERIZATION  07-19-2010  dr Tressia Miners turner    normal coronary arteries,  ef 60%,  moderate aortic stenosis- gradient 37mmHg, normal right heart pressure  . CARDIOVASCULAR STRESS TEST  05/ 2011   dr Marlou Porch   normal low risk perfusion study  . CATARACT EXTRACTION W/ INTRAOCULAR LENS  IMPLANT, BILATERAL  2013  . CATARACT EXTRACTION, BILATERAL    . COLONOSCOPY  2010  approx  . ERCP N/A 09/25/2018   Procedure: ENDOSCOPIC RETROGRADE CHOLANGIOPANCREATOGRAPHY (ERCP);  Surgeon: Clarene Essex, MD;  Location: [facility];  Service: Endoscopy;  Laterality: N/A;  . INGUINAL HERNIA REPAIR Right 08/23/2018   Procedure: OPEN RIGHT INGUINAL HERNIA REPAIR WITH MESH;  Surgeon: Armandina Gemma, MD;  Location: [facility];  Service: General;  Laterality: Right;  . INTRAOPERATIVE TRANSTHORACIC ECHOCARDIOGRAM N/A 03/27/2018   Procedure: INTRAOPERATIVE TRANSTHORACIC ECHOCARDIOGRAM;  Surgeon: Burnell Blanks, MD;  Location: [facility];  Service: Open Heart Surgery;  Laterality: N/A;  . REMOVAL OF STONES  09/25/2018   Procedure: REMOVAL OF STONES;  Surgeon: Clarene Essex, MD;  Location: [facility];  Service: Endoscopy;;  . RIGHT/LEFT HEART CATH AND CORONARY ANGIOGRAPHY N/A 02/06/2018   Procedure: RIGHT/LEFT HEART CATH AND CORONARY ANGIOGRAPHY;  Surgeon: Burnell Blanks, MD;  Location: [facility];  Service: Cardiovascular;  Laterality: N/A;  . SPHINCTEROTOMY  09/25/2018   Procedure: SPHINCTEROTOMY;  Surgeon: Clarene Essex, MD;  Location: [facility];  Service: Endoscopy;;  . THYROID LOBECTOMY Right 05/05/2015   Procedure: RIGHT THYROID LOBECTOMY;  Surgeon: Armandina Gemma, MD;  Location: [facility];  Service: General;  Laterality: Right;  . TRANSANAL HEMORRHOIDAL DEARTERIALIZATION N/A 04/09/2014   Procedure: TRANSANAL HEMORRHOIDAL DEARTERIALIZATION OF INTERNAL HEMORROIDS;  Surgeon: Leighton Ruff, MD;  Location: [facility];  Service: General;  Laterality: N/A;  . TRANSCATHETER AORTIC VALVE REPLACEMENT, TRANSFEMORAL  03/27/2018  . TRANSCATHETER AORTIC  VALVE REPLACEMENT, TRANSFEMORAL N/A 03/27/2018   Procedure: TRANSCATHETER AORTIC VALVE REPLACEMENT, TRANSFEMORAL. Edwards SAPIEN 3 Transcatheter Heart Valve 59mm.;  Surgeon: Burnell Blanks, MD;  Location: [place];  Service: Open Heart Surgery;  Laterality: N/A;  . TRANSTHORACIC ECHOCARDIOGRAM  11-26-2013   moderate focal basal and mild LVH/  ef 74-08%/  grade I diastolic dysfunction/ mild LAE/  moderate calcification with stenosis AV with mild regurg,  gradients 35 abd 58 mmHg /  mild TR    Social History:  reports that he quit smoking about 37 years ago. His smoking use included cigarettes. He started smoking about 45 years ago. He quit after 40.00 years of use. He has never used smokeless tobacco. He reports current alcohol use. He reports that he does not use drugs.  Family History:  Family History  Problem Relation Age of Onset  . Pulmonary embolism Mother 49       caused by complications of surgery  . CVA Father   . Diabetes Brother   . Diabetes Brother   . Breast cancer Sister      Prior to Admission medications   Medication Sig Start Date End Date Taking? Authorizing Provider  Ascorbic Acid (VITAMIN C) 1000 MG tablet Take 1,000 mg by mouth daily after lunch.   Yes [provider]  aspirin 81 MG tablet Take 1 tablet (81 mg total)  by mouth daily. Patient taking differently: Take 81 mg by mouth daily.  06/05/18  Yes Barrett, Evelene Croon, PA-C  cholecalciferol (VITAMIN D3) 25 MCG (1000 UT) tablet Take 1,000 Units by mouth daily.   Yes [provider]  dutasteride (AVODART) 0.5 MG capsule Take 0.5 mg by mouth daily.   Yes [provider]  escitalopram (LEXAPRO) 20 MG tablet Take 1 tablet (20 mg total) by mouth daily. Patient taking differently: Take 20 mg by mouth at bedtime.  09/06/16  Yes Angiulli, Lavon Paganini, PA-C  folic acid (FOLVITE) 1 MG tablet Take 1 mg by mouth every evening.  01/27/15  Yes [provider]  furosemide (LASIX) 20 MG tablet  Take 20 mg by mouth.   Yes [provider]  losartan (COZAAR) 25 MG tablet Take 25 mg by mouth daily.  01/21/18  Yes [provider]  Melatonin 5 MG TABS Take 5 mg by mouth at bedtime as needed (for sleep).    Yes [provider]  Omega-3 Fatty Acids (FISH OIL) 1000 MG CAPS Take 1,000 mg by mouth daily.   Yes [provider]  vitamin E 400 UNIT capsule Take 400 Units by mouth daily after lunch.   Yes [provider]  amoxicillin-clavulanate (AUGMENTIN) 875-125 MG tablet Take 1 tablet by mouth every 12 (twelve) hours. Patient not taking: Reported on 10/09/2018 09/26/18   Geradine Girt, DO  pantoprazole (PROTONIX) 40 MG tablet Take 1 tablet (40 mg total) by mouth daily. Patient not taking: Reported on 10/09/2018 09/27/18   Geradine Girt, DO  traMADol (ULTRAM) 50 MG tablet Take 1-2 tablets (50-100 mg total) by mouth every 6 (six) hours as needed. Patient not taking: Reported on 10/09/2018 08/23/18   Armandina Gemma, MD    Physical Exam: Vitals:   10/09/18 1845 10/09/18 1900 10/09/18 1915 10/09/18 2007  BP: 125/80 (!) 159/51 (!) 145/58 (!) 159/55  Pulse: (!) 55 67 61 60  Resp: 20 (!) 22 (!) 22 19  Temp:      TempSrc:      SpO2: 97% 95% 95% 97%  Weight:       General: Not in acute distress HEENT:       Eyes: PERRL, EOMI, no scleral icterus.       ENT: No discharge from the ears and nose, no pharynx injection, no tonsillar enlargement.        Neck: No JVD, no bruit, no mass felt. Heme: No neck lymph node enlargement. Cardiac: S1/S2, RRR, No murmurs, No gallops or rubs. Respiratory:  No rales, wheezing, rhonchi or rubs. GI: Soft, nondistended, has tenderness in RUQ, no rebound pain, no organomegaly, BS present. GU: No hematuria Ext: No pitting leg edema bilaterally. 2+DP/PT pulse bilaterally. Musculoskeletal: No joint deformities, No joint redness or warmth, no limitation of ROM in spin. Skin: No rashes.  Neuro: Alert, oriented X3, cranial nerves  II-XII grossly intact, moves all extremities normally.  Psych: Patient is not psychotic, no suicidal or hemocidal ideation.  Labs on Admission: I have personally reviewed following labs and imaging studies  CBC: Recent Labs  Lab 10/09/18 1736  WBC 8.6  HGB 12.4*  HCT 39.0  MCV 91.5  PLT 347   Basic Metabolic Panel: Recent Labs  Lab 10/09/18 1736  NA 136  K 4.8  CL 100  CO2 27  GLUCOSE 150*  BUN 14  CREATININE 1.20  CALCIUM 9.2   GFR: Estimated Creatinine Clearance: 38.6 mL/min (by C-G formula based on SCr of  1.2 mg/dL). Liver Function Tests: Recent Labs  Lab 10/09/18 1736  AST 21  ALT 20  ALKPHOS 88  BILITOT 1.1  PROT 6.9  ALBUMIN 3.6   Recent Labs  Lab 10/09/18 1736  LIPASE 39   No results for input(s): AMMONIA in the last 168 hours. Coagulation Profile: No results for input(s): INR, PROTIME in the last 168 hours. Cardiac Enzymes: No results for input(s): CKTOTAL, CKMB, CKMBINDEX, TROPONINI in the last 168 hours. BNP (last 3 results) No results for input(s): PROBNP in the last 8760 hours. HbA1C: No results for input(s): HGBA1C in the last 72 hours. CBG: No results for input(s): GLUCAP in the last 168 hours. Lipid Profile: No results for input(s): CHOL, HDL, LDLCALC, TRIG, CHOLHDL, LDLDIRECT in the last 72 hours. Thyroid Function Tests: No results for input(s): TSH, T4TOTAL, FREET4, T3FREE, THYROIDAB in the last 72 hours. Anemia Panel: No results for input(s): VITAMINB12, FOLATE, FERRITIN, TIBC, IRON, RETICCTPCT in the last 72 hours. Urine analysis:    Component Value Date/Time   COLORURINE YELLOW 10/09/2018 1736   APPEARANCEUR CLEAR 10/09/2018 1736   LABSPEC 1.012 10/09/2018 1736   PHURINE 6.0 10/09/2018 1736   GLUCOSEU NEGATIVE 10/09/2018 1736   HGBUR NEGATIVE 10/09/2018 1736   BILIRUBINUR NEGATIVE 10/09/2018 1736   KETONESUR NEGATIVE 10/09/2018 1736   PROTEINUR NEGATIVE 10/09/2018 1736   UROBILINOGEN 1.0 04/14/2014 0116   NITRITE  NEGATIVE 10/09/2018 1736   LEUKOCYTESUR NEGATIVE 10/09/2018 1736   Sepsis Labs: @LABRCNTIP (procalcitonin:4,lacticidven:4) )No results found for this or any previous visit (from the past 240 hour(s)).   Radiological Exams on Admission: US Abdomen Limited Ruq  Result Date: 10/09/2018 CLINICAL DATA:  Initial evaluation for acute right upper quadrant pain. EXAM: ULTRASOUND ABDOMEN LIMITED RIGHT UPPER QUADRANT COMPARISON:  Prior MRI from 09/24/2018 FINDINGS: Gallbladder: Nonshadowing layering echogenic material within the gallbladder lumen most consistent with sludge. No frank shadowing cholelithiasis. Gallbladder wall measures within normal limits of 1.8 mm. No free pericholecystic fluid. No sonographic Murphy sign elicited on exam. Common bile duct: Diameter: 5.5 mm.  No choledocholithiasis. Liver: No focal lesion identified. Within normal limits in parenchymal echogenicity. Portal vein is patent on color Doppler imaging with normal direction of blood flow towards the liver. Incidental note made of 2 simple right renal cysts measuring 1.6 x 2.1 x 1.9 cm and 3.7 x 3.5 x 2.9 cm. IMPRESSION: 1. Gallbladder sludge without frank cholelithiasis. No sonographic features to suggest acute cholecystitis. 2. No biliary dilatation or choledocholithiasis by ultrasound. 3. Incidental simple right renal cysts as above. Electronically Signed   By: Jeannine Boga M.D.   On: 10/09/2018 18:42     EKG: Independently reviewed.  Sinus rhythm, QTC 411, LAE, low voltage.   Assessment/Plan Principal Problem:   Abdominal pain Active Problems:   Benign essential HTN   S/P TAVR (transcatheter aortic valve replacement)   GERD (gastroesophageal reflux disease)   Benign prostatic hyperplasia   Depression   Chronic diastolic CHF (congestive heart failure) (HCC)   CKD (chronic kidney disease), stage III (HCC)   Abdominal pain: likely due to symptomatic cholelithiasis. US-RUQ showed gallbladder sludge without frank  cholelithiasis. No sonographic features to suggest acute cholecystitis. Lipase normal. Dr. Georgette Dover of general surgeon was consulted, planing to do surgery tomorrow. Dr. Georgette Dover asks for cardiac clearance.   -will admit to tele bed as inpt -IVF: 500 ml of NS in ED -INR/PTT/type & screen -prn morphine and percocet for pain -prn zofran for nausea. -card consult is requested for cardiac clearance via Epic -  NPO after MN  HTN:  -Continue home medications: Cozaar -IV hydralazine prn  GERD (gastroesophageal reflux disease): -protonix  Benign prostatic hyperplasia: -Avodart  Depression: -Lexapro  Chronic diastolic CHF: stable.  Patient does not have shortness breath.  No leg edema JVD.  CHF is compensated. -Hold Lasix -Check BMP  CKD (chronic kidney disease), stage III: Stable. Cre 1.2 and BUN 14. -f/u by BMP  S/P TAVR (transcatheter aortic valve replacement): -no acute issues      Inpatient status:  # Patient requires inpatient status due to high intensity of service, high risk for further deterioration and high frequency of surveillance required.  I certify that at the point of admission it is my clinical judgment that the patient will require inpatient hospital care spanning beyond 2 midnights from the point of admission.  . This patient has multiple chronic comorbidities including HTN, BPH, GERD, rheumatoid arthritis,dCHF(EF 60-65% 04/25/18), aortic stenosis s/p TAVR 03/27/2018, hyperlipidemia, depression, Gilbert syndrome, hard of hearing, CKD-III. Marland Kitchen Now patient has presenting with RUQ-AP . The worrisome physical exam findings include abdominal tenderness over right upper quadrant . The initial radiographic and laboratory data are worrisome because that US-RUQ showed gallbladder sludge without frank cholelithiasis.  . Current medical needs: please see my assessment and plan . Predictability of an adverse outcome (risk): Patient has multiple comorbidities, now presents with  recurrent abdominal pain due to cholelithiasis.  Patient will need surgery.  Given his old age and multiple comorbidities, patient is at high risk of deteriorating.  Patient will need to be treated in hospital for at least 2 days.   DVT ppx: SCD Code Status: DNR (I discussed with patient's daughter who is the POA and his granddaughter who is an ED physician on the phone, and explained the meaning of CODE STATUS. Per them, patient would want to be DNR). Family Communication:   Yes, patient's daughter and granddaughter Disposition Plan:  Anticipate discharge back to previous home environment Consults called: Dr. Georgette Dover of general surgeon Admission status:  Inpatient/tele      Date of Service 10/09/2018    [facility]   If 7PM-7AM, please contact night-coverage [email or web address] Password [facility] 10/09/2018, 8:29 PM

## 2018-10-09 NOTE — ED Provider Notes (Signed)
[facility] Emergency Department Provider Note MRN:  [id number]  Arrival date & time: 10/09/18     Chief Complaint   Abdominal Pain   History of Present Illness   [name] is a 85 y.o. year-old male with a history of aortic valve replacement, rheumatoid arthritis, GERD, CHF presenting to the ED with chief complaint of abdominal pain.  History obtained from daughter over the phone given that patient speaks very little [place].  Patient has had recent admission for biliary sludge and obstruction.  Was discharged on antibiotics to have his gallbladder removed as an outpatient on an elective basis.  Has had return of significant right upper quadrant pain 3 days ago, moderate in severity, constant.  Also associated with significant generalized weakness.  Denies fever.  Nausea but no vomiting, no chest pain or shortness of breath.  No exacerbating relieving factors.  Review of Systems  A complete 10 system review of systems was obtained and all systems are negative except as noted in the HPI and PMH.   Patient's Health History    Past Medical History:  Diagnosis Date  . BPH (benign prostatic hypertrophy)   . Depression   . Diverticulosis of colon   . GERD (gastroesophageal reflux disease)   . Gilbert's syndrome   . History of kidney stones   . HLD (hyperlipidemia)   . HOH (hard of hearing)    refuses to wear his Hearing Aid  . Hypertension   . Inguinal hernia    right  . Internal hemorrhoid   . Normal coronary arteries    a. Cath 07/2010: normal coronaries. Felt to have noncardiac CP/SOB at that time possibly r/t anxiety.  . RA (rheumatoid arthritis) ([place])    bilateral hands/ wrist--  seronegative  . S/P TAVR (transcatheter aortic valve replacement) 03/27/2018   23 mm Edwards Sapien 3 transcatheter heart valve placed via percutaneous right transfemoral approach   . Severe aortic stenosis   . Thyroid nodule    noted 11-2009  . Wears dentures     Past  Surgical History:  Procedure Laterality Date  . CARDIAC CATHETERIZATION  07-19-2010  dr Tressia Miners turner   normal coronary arteries,  ef 60%,  moderate aortic stenosis- gradient 42mmHg, normal right heart pressure  . CARDIOVASCULAR STRESS TEST  05/ 2011   dr Marlou Porch   normal low risk perfusion study  . CATARACT EXTRACTION W/ INTRAOCULAR LENS  IMPLANT, BILATERAL  2013  . CATARACT EXTRACTION, BILATERAL    . COLONOSCOPY  2010  approx  . ERCP N/A 09/25/2018   Procedure: ENDOSCOPIC RETROGRADE CHOLANGIOPANCREATOGRAPHY (ERCP);  Surgeon: Clarene Essex, MD;  Location: [place];  Service: Endoscopy;  Laterality: N/A;  . INGUINAL HERNIA REPAIR Right 08/23/2018   Procedure: OPEN RIGHT INGUINAL HERNIA REPAIR WITH MESH;  Surgeon: Armandina Gemma, MD;  Location: [facility];  Service: General;  Laterality: Right;  . INTRAOPERATIVE TRANSTHORACIC ECHOCARDIOGRAM N/A 03/27/2018   Procedure: INTRAOPERATIVE TRANSTHORACIC ECHOCARDIOGRAM;  Surgeon: Burnell Blanks, MD;  Location: [place];  Service: Open Heart Surgery;  Laterality: N/A;  . REMOVAL OF STONES  09/25/2018   Procedure: REMOVAL OF STONES;  Surgeon: Clarene Essex, MD;  Location: [place];  Service: Endoscopy;;  . RIGHT/LEFT HEART CATH AND CORONARY ANGIOGRAPHY N/A 02/06/2018   Procedure: RIGHT/LEFT HEART CATH AND CORONARY ANGIOGRAPHY;  Surgeon: Burnell Blanks, MD;  Location: [place] [facility];  Service: Cardiovascular;  Laterality: N/A;  . SPHINCTEROTOMY  09/25/2018   Procedure: SPHINCTEROTOMY;  Surgeon: Clarene Essex, MD;  Location: [place] [facility];  Service: Endoscopy;;  . THYROID LOBECTOMY Right 05/05/2015   Procedure: RIGHT THYROID LOBECTOMY;  Surgeon: Armandina Gemma, MD;  Location: [place];  Service: General;  Laterality: Right;  . TRANSANAL HEMORRHOIDAL DEARTERIALIZATION N/A 04/09/2014   Procedure: TRANSANAL HEMORRHOIDAL DEARTERIALIZATION OF INTERNAL HEMORROIDS;  Surgeon: Leighton Ruff, MD;  Location: [name];  Service: General;   Laterality: N/A;  . TRANSCATHETER AORTIC VALVE REPLACEMENT, TRANSFEMORAL  03/27/2018  . TRANSCATHETER AORTIC VALVE REPLACEMENT, TRANSFEMORAL N/A 03/27/2018   Procedure: TRANSCATHETER AORTIC VALVE REPLACEMENT, TRANSFEMORAL. Edwards SAPIEN 3 Transcatheter Heart Valve 48mm.;  Surgeon: Burnell Blanks, MD;  Location: [place];  Service: Open Heart Surgery;  Laterality: N/A;  . TRANSTHORACIC ECHOCARDIOGRAM  11-26-2013   moderate focal basal and mild LVH/  ef 22-48%/  grade I diastolic dysfunction/ mild LAE/  moderate calcification with stenosis AV with mild regurg,  gradients 35 abd 58 mmHg /  mild TR    Family History  Problem Relation Age of Onset  . Pulmonary embolism Mother 48       caused by complications of surgery  . CVA Father   . Diabetes Brother   . Diabetes Brother   . Breast cancer Sister     Social History   Socioeconomic History  . Marital status: Widowed    Spouse name: Not on file  . Number of children: Not on file  . Years of education: Not on file  . Highest education level: Not on file  Occupational History  . Not on file  Social Needs  . Financial resource strain: Not on file  . Food insecurity:    Worry: Not on file    Inability: Not on file  . Transportation needs:    Medical: Not on file    Non-medical: Not on file  Tobacco Use  . Smoking status: Former Smoker    Years: 40.00    Types: Cigarettes    Start date: 06/13/1973    Last attempt to quit: 04/03/1981    Years since quitting: 37.5  . Smokeless tobacco: Never Used  Substance and Sexual Activity  . Alcohol use: Yes    Comment: occasional  . Drug use: No  . Sexual activity: Not on file  Lifestyle  . Physical activity:    Days per week: Not on file    Minutes per session: Not on file  . Stress: Not on file  Relationships  . Social connections:    Talks on phone: Not on file    Gets together: Not on file    Attends religious service: Not on file    Active member of club or organization:  Not on file    Attends meetings of clubs or organizations: Not on file    Relationship status: Not on file  . Intimate partner violence:    Fear of current or ex partner: Not on file    Emotionally abused: Not on file    Physically abused: Not on file    Forced sexual activity: Not on file  Other Topics Concern  . Not on file  Social History Narrative  . Not on file     Physical Exam  Vital Signs and Nursing Notes reviewed Vitals:   10/09/18 1844 10/09/18 1845  BP: (!) 160/64 125/80  Pulse: (!) 59 (!) 55  Resp: 15 20  Temp:    SpO2: 96% 97%    CONSTITUTIONAL: Well-appearing, NAD NEURO:  Alert and oriented x 3, no focal deficits EYES:  eyes equal and reactive ENT/NECK:  no LAD, no JVD CARDIO: Regular rate, well-perfused, normal S1 and S2 PULM:  CTAB no wheezing or rhonchi GI/GU:  normal bowel sounds, non-distended, moderate right upper quadrant tenderness to palpation MSK/SPINE:  No gross deformities, no edema SKIN:  no rash, atraumatic PSYCH:  Appropriate speech and behavior  Diagnostic and Interventional Summary    EKG Interpretation  Date/Time:  Tuesday October 09 2018 17:50:50 EDT Ventricular Rate:  63 PR Interval:    QRS Duration: 90 QT Interval:  401 QTC Calculation: 411 R Axis:   -7 Text Interpretation:  Sinus rhythm Confirmed by Gerlene Fee [phone]) on 10/09/2018 5:52:10 PM      Labs Reviewed  CBC - Abnormal; Notable for the following components:      Result Value   Hemoglobin 12.4 (*)    All other components within normal limits  COMPREHENSIVE METABOLIC PANEL - Abnormal; Notable for the following components:   Glucose, Bld 150 (*)    GFR calc non Af Amer 54 (*)    All other components within normal limits  LIPASE, BLOOD  LACTIC ACID, PLASMA  URINALYSIS, ROUTINE W REFLEX MICROSCOPIC    US Abdomen Limited RUQ  Final Result      Medications  famotidine (PEPCID) IVPB 20 mg premix (20 mg Intravenous New Bag/Given 10/09/18 1849)  ondansetron  (ZOFRAN) injection 4 mg (4 mg Intravenous Given 10/09/18 1738)  sodium chloride 0.9 % bolus 500 mL (0 mLs Intravenous Stopped 10/09/18 1841)  fentaNYL (SUBLIMAZE) injection 25 mcg (25 mcg Intravenous Given 10/09/18 1738)     Procedures Critical Care  ED Course and Medical Decision Making  I have reviewed the triage vital signs and the nursing notes.  Pertinent labs & imaging results that were available during my care of the patient were reviewed by me and considered in my medical decision making (see below for details).  Will provide pain and nausea control, obtain labs, repeat right upper quadrant ultrasound to evaluate for worsening of his biliary pathology, considering cholecystitis.  Will likely need general surgery consultation and repeat admission.  Evaluated by general surgery, who recommends hospitalist admission, cardiac clearance, cholecystectomy tomorrow.  Barth Kirks. Sedonia Small, MD [place] [email or web address]  Final Clinical Impressions(s) / ED Diagnoses     ICD-10-CM   1. RUQ pain R10.11 US Abdomen Limited RUQ    US Abdomen Limited RUQ    ED Discharge Orders    None         Maudie Flakes, MD 10/09/18 Dre[facility]

## 2018-10-09 NOTE — ED Provider Notes (Signed)
Care assumed from Dr. Sedonia Small as patient was transferred to yellow zone, please see his note for full details, but in brief [name] is a 85 y.o. male with a history of known gallbladder disease, who presents with worsening right upper quadrant abdominal pain and nausea.  Patient was scheduled for outpatient cholecystectomy in June but presented today due to worsening symptoms.  Abdominal labs and right upper quadrant ultrasound pending at handoff.  Patient is afebrile and well-appearing.  Plan: Follow-up on labs and ultrasound, likely surgical consult.  Physical Exam  BP 125/80   Pulse (!) 55   Temp 98.4 F (36.9 C) (Oral)   Resp 20   Wt 75.1 kg   SpO2 97%   BMI 29.34 kg/m   Physical Exam Vitals signs and nursing note reviewed.  Constitutional:      General: He is not in acute distress.    Appearance: He is well-developed. He is not diaphoretic.  HENT:     Head: Normocephalic and atraumatic.  Eyes:     General:        Right eye: No discharge.        Left eye: No discharge.  Cardiovascular:     Rate and Rhythm: Bradycardia present.     Comments: Mild bradycardia Pulmonary:     Effort: Pulmonary effort is normal. No respiratory distress.  Abdominal:     Tenderness: There is abdominal tenderness in the right upper quadrant.  Neurological:     Mental Status: He is alert.     Coordination: Coordination normal.  Psychiatric:        Behavior: Behavior normal.     ED Course/Procedures   Labs Reviewed  CBC - Abnormal; Notable for the following components:      Result Value   Hemoglobin 12.4 (*)    All other components within normal limits  COMPREHENSIVE METABOLIC PANEL - Abnormal; Notable for the following components:   Glucose, Bld 150 (*)    GFR calc non Af Amer 54 (*)    All other components within normal limits  LIPASE, BLOOD  LACTIC ACID, PLASMA  URINALYSIS, ROUTINE W REFLEX MICROSCOPIC   US Abdomen Limited Ruq  Result Date: 10/09/2018 CLINICAL DATA:  Initial  evaluation for acute right upper quadrant pain. EXAM: ULTRASOUND ABDOMEN LIMITED RIGHT UPPER QUADRANT COMPARISON:  Prior MRI from 09/24/2018 FINDINGS: Gallbladder: Nonshadowing layering echogenic material within the gallbladder lumen most consistent with sludge. No frank shadowing cholelithiasis. Gallbladder wall measures within normal limits of 1.8 mm. No free pericholecystic fluid. No sonographic Murphy sign elicited on exam. Common bile duct: Diameter: 5.5 mm.  No choledocholithiasis. Liver: No focal lesion identified. Within normal limits in parenchymal echogenicity. Portal vein is patent on color Doppler imaging with normal direction of blood flow towards the liver. Incidental note made of 2 simple right renal cysts measuring 1.6 x 2.1 x 1.9 cm and 3.7 x 3.5 x 2.9 cm. IMPRESSION: 1. Gallbladder sludge without frank cholelithiasis. No sonographic features to suggest acute cholecystitis. 2. No biliary dilatation or choledocholithiasis by ultrasound. 3. Incidental simple right renal cysts as above. Electronically Signed   By: Jeannine Boga M.D.   On: 10/09/2018 18:42     Procedures  MDM   85 year old male with known gallbladder disease coming in with worsening right upper quadrant pain and nausea.  Had plans for outpatient cholecystectomy in June but presents today with worsening symptoms, is followed by Dr. Rosendo Gros.  Labs today are reassuring, no leukocytosis, no acute electrolyte derangements, normal  renal and liver function and normal lipase.  Right upper quadrant ultrasound shows gallbladder sludge without frank cholelithiasis or signs of acute cholecystitis but given worsening symptoms discussed with Dr. Georgette Dover with general surgery who is already aware of the patient.  Will plan for likely inpatient cholecystectomy but patient will require cardiology clearance first, request medicine admission.  Case discussed with Dr. Blaine Hamper with [facility] hospitalist who will see and admit the patient and  discussed with cardiology for surgical clearance.       Jacqlyn Larsen, PA-C 10/09/18 2006    Maudie Flakes, MD 10/14/18 [phone]

## 2018-10-09 NOTE — ED Notes (Signed)
ED TO INPATIENT HANDOFF REPORT  ED Nurse Name and Phone #: Benjamine Mola [phone]  S Name/Age/Gender [name] 85 y.o. male Room/Bed: 038C/038C  Code Status   Code Status: DNR  Home/SNF/Other Home Patient oriented to: self, place, time and situation Is this baseline? Yes   Triage Complete: Triage complete  Chief Complaint possible gall bladder problem/pre op appointment  Triage Note Pt in with c/o generalized upper abdominal pain, worsened x 24hrs. Has gallstones and is scheduled for gallbladder removal on 6/17. Had MRCP 2 wks ago and surgeons found stomach ulcer as well, family states they were not advised to stop taking Ibuprofen, denies any emesis or blood in stools, per daughter is just weaker than normal. Temp 98.5   Allergies Allergies  Allergen Reactions  . Ativan [Lorazepam] Other (See Comments)    hallucinations  . Phenergan [Promethazine Hcl] Hypertension  . Eggs Or Egg-Derived Products Rash    Small rash after eating for several days in a row    Level of Care/Admitting Diagnosis ED Disposition    ED Disposition Condition [facility] Area: [place] [[id number]]  Level of Care: Telemetry Medical [104]  Covid Evaluation: N/A  Diagnosis: Abdominal pain [353299]  Admitting Physician: Ivor Costa [[id number]]  Attending Physician: Ivor Costa [[id number]]  Estimated length of stay: past midnight tomorrow  Certification:: I certify this patient will need inpatient services for at least 2 midnights  PT Class (Do Not Modify): Inpatient [101]  PT Acc Code (Do Not Modify): Private [1]       B Medical/Surgery History Past Medical History:  Diagnosis Date  . BPH (benign prostatic hypertrophy)   . Depression   . Diverticulosis of colon   . GERD (gastroesophageal reflux disease)   . Gilbert's syndrome   . History of kidney stones   . HLD (hyperlipidemia)   . HOH (hard of hearing)    refuses to wear his Hearing Aid  . Hypertension   .  Inguinal hernia    right  . Internal hemorrhoid   . Normal coronary arteries    a. Cath 07/2010: normal coronaries. Felt to have noncardiac CP/SOB at that time possibly r/t anxiety.  . RA (rheumatoid arthritis) ([place])    bilateral hands/ wrist--  seronegative  . S/P TAVR (transcatheter aortic valve replacement) 03/27/2018   23 mm Edwards Sapien 3 transcatheter heart valve placed via percutaneous right transfemoral approach   . Severe aortic stenosis   . Thyroid nodule    noted 11-2009  . Wears dentures    Past Surgical History:  Procedure Laterality Date  . CARDIAC CATHETERIZATION  07-19-2010  dr Tressia Miners turner   normal coronary arteries,  ef 60%,  moderate aortic stenosis- gradient 56mmHg, normal right heart pressure  . CARDIOVASCULAR STRESS TEST  05/ 2011   dr Marlou Porch   normal low risk perfusion study  . CATARACT EXTRACTION W/ INTRAOCULAR LENS  IMPLANT, BILATERAL  2013  . CATARACT EXTRACTION, BILATERAL    . COLONOSCOPY  2010  approx  . ERCP N/A 09/25/2018   Procedure: ENDOSCOPIC RETROGRADE CHOLANGIOPANCREATOGRAPHY (ERCP);  Surgeon: Clarene Essex, MD;  Location: [facility];  Service: Endoscopy;  Laterality: N/A;  . INGUINAL HERNIA REPAIR Right 08/23/2018   Procedure: OPEN RIGHT INGUINAL HERNIA REPAIR WITH MESH;  Surgeon: Armandina Gemma, MD;  Location: [facility];  Service: General;  Laterality: Right;  . INTRAOPERATIVE TRANSTHORACIC ECHOCARDIOGRAM N/A 03/27/2018   Procedure: INTRAOPERATIVE TRANSTHORACIC ECHOCARDIOGRAM;  Surgeon: Burnell Blanks, MD;  Location: [facility];  Service: Open Heart Surgery;  Laterality: N/A;  . REMOVAL OF STONES  09/25/2018   Procedure: REMOVAL OF STONES;  Surgeon: Clarene Essex, MD;  Location: [facility];  Service: Endoscopy;;  . RIGHT/LEFT HEART CATH AND CORONARY ANGIOGRAPHY N/A 02/06/2018   Procedure: RIGHT/LEFT HEART CATH AND CORONARY ANGIOGRAPHY;  Surgeon: Burnell Blanks, MD;  Location: [facility];  Service: Cardiovascular;  Laterality: N/A;  .  SPHINCTEROTOMY  09/25/2018   Procedure: SPHINCTEROTOMY;  Surgeon: Clarene Essex, MD;  Location: [facility];  Service: Endoscopy;;  . THYROID LOBECTOMY Right 05/05/2015   Procedure: RIGHT THYROID LOBECTOMY;  Surgeon: Armandina Gemma, MD;  Location: [facility];  Service: General;  Laterality: Right;  . TRANSANAL HEMORRHOIDAL DEARTERIALIZATION N/A 04/09/2014   Procedure: TRANSANAL HEMORRHOIDAL DEARTERIALIZATION OF INTERNAL HEMORROIDS;  Surgeon: Leighton Ruff, MD;  Location: [facility];  Service: General;  Laterality: N/A;  . TRANSCATHETER AORTIC VALVE REPLACEMENT, TRANSFEMORAL  03/27/2018  . TRANSCATHETER AORTIC VALVE REPLACEMENT, TRANSFEMORAL N/A 03/27/2018   Procedure: TRANSCATHETER AORTIC VALVE REPLACEMENT, TRANSFEMORAL. Edwards SAPIEN 3 Transcatheter Heart Valve 76mm.;  Surgeon: Burnell Blanks, MD;  Location: [facility];  Service: Open Heart Surgery;  Laterality: N/A;  . TRANSTHORACIC ECHOCARDIOGRAM  11-26-2013   moderate focal basal and mild LVH/  ef 51-88%/  grade I diastolic dysfunction/ mild LAE/  moderate calcification with stenosis AV with mild regurg,  gradients 35 abd 58 mmHg /  mild TR     A IV Location/Drains/Wounds Patient Lines/Drains/Airways Status   Active Line/Drains/Airways    Name:   Placement date:   Placement time:   Site:   Days:   Peripheral IV 10/09/18 Left Antecubital   10/09/18    1736    Antecubital   less than 1   Incision (Closed) 08/23/18 Abdomen Right   08/23/18    1238     47          Intake/Output Last 24 hours  Intake/Output Summary (Last 24 hours) at 10/09/2018 2047 Last data filed at 10/09/2018 1919 Gross per 24 hour  Intake 543.61 ml  Output -  Net 543.61 ml    Labs/Imaging Results for orders placed or performed during the hospital encounter of 10/09/18 (from the past 48 hour(s))  CBC     Status: Abnormal   Collection Time: 10/09/18  5:36 PM  Result Value Ref Range   WBC 8.6 4.0 - 10.5 K/uL   RBC 4.26 4.22 - 5.81 MIL/uL    Hemoglobin 12.4 (L) 13.0 - 17.0 g/dL   HCT 39.0 39.0 - 52.0 %   MCV 91.5 80.0 - 100.0 fL   MCH 29.1 26.0 - 34.0 pg   MCHC 31.8 30.0 - 36.0 g/dL   RDW 12.2 11.5 - 15.5 %   Platelets 207 150 - 400 K/uL   nRBC 0.0 0.0 - 0.2 %    Comment: Performed at [facility], [place] [address], [place], [place] [address]  Comprehensive metabolic panel     Status: Abnormal   Collection Time: 10/09/18  5:36 PM  Result Value Ref Range   Sodium 136 135 - 145 mmol/L   Potassium 4.8 3.5 - 5.1 mmol/L   Chloride 100 98 - 111 mmol/L   CO2 27 22 - 32 mmol/L   Glucose, Bld 150 (H) 70 - 99 mg/dL   BUN 14 8 - 23 mg/dL   Creatinine, Ser 1.20 0.61 - 1.24 mg/dL   Calcium 9.2 8.9 - 10.3 mg/dL   Total Protein 6.9 6.5 -  8.1 g/dL   Albumin 3.6 3.5 - 5.0 g/dL   AST 21 15 - 41 U/L   ALT 20 0 - 44 U/L   Alkaline Phosphatase 88 38 - 126 U/L   Total Bilirubin 1.1 0.3 - 1.2 mg/dL   GFR calc non Af Amer 54 (L) >60 mL/min   GFR calc Af Amer >60 >60 mL/min   Anion gap 9 5 - 15    Comment: Performed at [facility] [address], [place], [place] [address]  Lipase, blood     Status: None   Collection Time: 10/09/18  5:36 PM  Result Value Ref Range   Lipase 39 11 - 51 U/L    Comment: Performed at [facility] [address] [place], [place] [address]  Lactic acid, plasma     Status: None   Collection Time: 10/09/18  5:36 PM  Result Value Ref Range   Lactic Acid, Venous 1.0 0.5 - 1.9 mmol/L    Comment: Performed at [facility] [address], [place], [place] [address]  Urinalysis, Routine w reflex microscopic     Status: None   Collection Time: 10/09/18  5:36 PM  Result Value Ref Range   Color, Urine YELLOW YELLOW   APPearance CLEAR CLEAR   Specific Gravity, Urine 1.012 1.005 - 1.030   pH 6.0 5.0 - 8.0   Glucose, UA NEGATIVE NEGATIVE mg/dL   Hgb urine dipstick NEGATIVE NEGATIVE   Bilirubin Urine NEGATIVE NEGATIVE   Ketones, ur NEGATIVE NEGATIVE mg/dL   Protein, ur NEGATIVE NEGATIVE  mg/dL   Nitrite NEGATIVE NEGATIVE   Leukocytes,Ua NEGATIVE NEGATIVE    Comment: Performed at [facility] [address], [place], [place] [address]  Protime-INR     Status: None   Collection Time: 10/09/18  8:17 PM  Result Value Ref Range   Prothrombin Time 13.8 11.4 - 15.2 seconds   INR 1.1 0.8 - 1.2    Comment: (NOTE) INR goal varies based on device and disease states. Performed at [facility], [place] [address] [place], [place] [address]   APTT     Status: None   Collection Time: 10/09/18  8:17 PM  Result Value Ref Range   aPTT 34 24 - 36 seconds    Comment: Performed at [facility] [address] [place], [place] [address]   US Abdomen Limited Ruq  Result Date: 10/09/2018 CLINICAL DATA:  Initial evaluation for acute right upper quadrant pain. EXAM: ULTRASOUND ABDOMEN LIMITED RIGHT UPPER QUADRANT COMPARISON:  Prior MRI from 09/24/2018 FINDINGS: Gallbladder: Nonshadowing layering echogenic material within the gallbladder lumen most consistent with sludge. No frank shadowing cholelithiasis. Gallbladder wall measures within normal limits of 1.8 mm. No free pericholecystic fluid. No sonographic Murphy sign elicited on exam. Common bile duct: Diameter: 5.5 mm.  No choledocholithiasis. Liver: No focal lesion identified. Within normal limits in parenchymal echogenicity. Portal vein is patent on color Doppler imaging with normal direction of blood flow towards the liver. Incidental note made of 2 simple right renal cysts measuring 1.6 x 2.1 x 1.9 cm and 3.7 x 3.5 x 2.9 cm. IMPRESSION: 1. Gallbladder sludge without frank cholelithiasis. No sonographic features to suggest acute cholecystitis. 2. No biliary dilatation or choledocholithiasis by ultrasound. 3. Incidental simple right renal cysts as above. Electronically Signed   By: Jeannine Boga M.D.   On: 10/09/2018 18:42    Pending Labs Unresulted Labs (From admission, onward)  Start     Ordered   10/10/18 5465   Basic metabolic panel  Tomorrow morning,   R     10/09/18 2005   10/10/18 0500  CBC  Tomorrow morning,   R     10/09/18 2005   10/09/18 2004  Brain natriuretic peptide  Once,   R     10/09/18 2004   10/09/18 2004  Type and screen [place]  Once,   R    Comments:  Early    10/09/18 2004          Vitals/Pain Today's Vitals   10/09/18 1845 10/09/18 1900 10/09/18 1915 10/09/18 2007  BP: 125/80 (!) 159/51 (!) 145/58 (!) 159/55  Pulse: (!) 55 67 61 60  Resp: 20 (!) 22 (!) 22 19  Temp:      TempSrc:      SpO2: 97% 95% 95% 97%  Weight:      PainSc:        Isolation Precautions No active isolations  Medications Medications  aspirin tablet 81 mg (has no administration in time range)  losartan (COZAAR) tablet 25 mg (has no administration in time range)  escitalopram (LEXAPRO) tablet 20 mg (has no administration in time range)  dutasteride (AVODART) capsule 0.5 mg (has no administration in time range)  folic acid (FOLVITE) tablet 1 mg (has no administration in time range)  Melatonin TABS 5 mg (has no administration in time range)  vitamin C (ASCORBIC ACID) tablet 1,000 mg (has no administration in time range)  cholecalciferol (VITAMIN D3) tablet 1,000 Units (has no administration in time range)  omega-3 acid ethyl esters (LOVAZA) capsule 1 g (has no administration in time range)  vitamin E capsule 400 Units (has no administration in time range)  morphine 2 MG/ML injection 0.5 mg (has no administration in time range)  oxyCODONE-acetaminophen (PERCOCET/ROXICET) 5-325 MG per tablet 1 tablet (has no administration in time range)  acetaminophen (TYLENOL) tablet 650 mg (has no administration in time range)    Or  acetaminophen (TYLENOL) suppository 650 mg (has no administration in time range)  senna-docusate (Senokot-S) tablet 1 tablet (has no administration in time range)  ondansetron (ZOFRAN) tablet 4 mg (has no administration in time range)     Or  ondansetron (ZOFRAN) injection 4 mg (has no administration in time range)  hydrALAZINE (APRESOLINE) injection 5 mg (has no administration in time range)  ondansetron (ZOFRAN) injection 4 mg (4 mg Intravenous Given 10/09/18 1738)  sodium chloride 0.9 % bolus 500 mL (0 mLs Intravenous Stopped 10/09/18 1841)  fentaNYL (SUBLIMAZE) injection 25 mcg (25 mcg Intravenous Given 10/09/18 1738)  famotidine (PEPCID) IVPB 20 mg premix (0 mg Intravenous Stopped 10/09/18 1919)    Mobility walks with person assist Moderate fall risk   Focused Assessments    R Recommendations: See Admitting Provider Note  Report given to:   Additional Notes:

## 2018-10-09 NOTE — ED Triage Notes (Signed)
Pt in with c/o generalized upper abdominal pain, worsened x 24hrs. Has gallstones and is scheduled for gallbladder removal on 6/17. Had MRCP 2 wks ago and surgeons found stomach ulcer as well, family states they were not advised to stop taking Ibuprofen, denies any emesis or blood in stools, per daughter is just weaker than normal. Temp 98.5

## 2018-10-10 DIAGNOSIS — R1011 Right upper quadrant pain: Secondary | ICD-10-CM

## 2018-10-10 DIAGNOSIS — N400 Enlarged prostate without lower urinary tract symptoms: Secondary | ICD-10-CM

## 2018-10-10 DIAGNOSIS — Z952 Presence of prosthetic heart valve: Secondary | ICD-10-CM

## 2018-10-10 DIAGNOSIS — I5032 Chronic diastolic (congestive) heart failure: Secondary | ICD-10-CM

## 2018-10-10 LAB — BASIC METABOLIC PANEL
Anion gap: 7 (ref 5–15)
BUN: 11 mg/dL (ref 8–23)
CO2: 25 mmol/L (ref 22–32)
Calcium: 8.8 mg/dL — ABNORMAL LOW (ref 8.9–10.3)
Chloride: 103 mmol/L (ref 98–111)
Creatinine, Ser: 1.01 mg/dL (ref 0.61–1.24)
GFR calc Af Amer: >60 mL/min (ref >60)
GFR calc non Af Amer: >60 mL/min (ref >60)
Glucose, Bld: 133 mg/dL — ABNORMAL HIGH (ref 70–99)
Potassium: 4 mmol/L (ref 3.5–5.1)
Sodium: 135 mmol/L (ref 135–145)

## 2018-10-10 LAB — MRSA PCR SCREENING: MRSA by PCR: NEGATIVE

## 2018-10-10 LAB — CBC
HCT: 35.8 % — ABNORMAL LOW (ref 39.0–52.0)
Hemoglobin: 11.6 g/dL — ABNORMAL LOW (ref 13.0–17.0)
MCH: 29 pg (ref 26.0–34.0)
MCHC: 32.4 g/dL (ref 30.0–36.0)
MCV: 89.5 fL (ref 80.0–100.0)
Platelets: 187 10*3/uL (ref 150–400)
RBC: 4 MIL/uL — ABNORMAL LOW (ref 4.22–5.81)
RDW: 12 % (ref 11.5–15.5)
WBC: 8.1 10*3/uL (ref 4.0–10.5)
nRBC: 0 % (ref 0.0–0.2)

## 2018-10-10 LAB — TYPE AND SCREEN
ABO/RH(D): B POS
Antibody Screen: NEGATIVE

## 2018-10-10 NOTE — Anesthesia Preprocedure Evaluation (Addendum)
Anesthesia Evaluation  Patient identified by MRN, date of birth, ID band Patient awake    Reviewed: Allergy & Precautions, NPO status , Patient's Chart, lab work & pertinent test results  Airway Mallampati: II  TM Distance: >3 FB Neck ROM: Full    Dental no notable dental hx. (+) Teeth Intact, Dental Advisory Given   Pulmonary former smoker,    Pulmonary exam normal breath sounds clear to auscultation       Cardiovascular hypertension, Pt. on medications Normal cardiovascular exam Rhythm:Regular Rate:Normal     Neuro/Psych    GI/Hepatic GERD  ,  Endo/Other    Renal/GU K+ 4.3     Musculoskeletal   Abdominal   Peds  Hematology Hgb 11.6 Plt 187   Anesthesia Other Findings Pt speaks [place]  Reproductive/Obstetrics                            Anesthesia Physical Anesthesia Plan  ASA: III  Anesthesia Plan: General   Post-op Pain Management:    Induction: Cricoid pressure planned, Rapid sequence and Intravenous  PONV Risk Score and Plan: 3 and Treatment may vary due to age or medical condition, Ondansetron and Dexamethasone  Airway Management Planned: Oral ETT  Additional Equipment:   Intra-op Plan:   Post-operative Plan: Extubation in OR  Informed Consent: I have reviewed the patients History and Physical, chart, labs and discussed the procedure including the risks, benefits and alternatives for the proposed anesthesia with the patient or authorized representative who has indicated his/her understanding and acceptance.     Dental advisory given  Plan Discussed with: CRNA  Anesthesia Plan Comments:        Anesthesia Quick Evaluation

## 2018-10-10 NOTE — Consult Note (Addendum)
Cardiology Consultation:   Patient ID: [name] MRN: [id number]; DOB: [birth year]  Admit date: 10/09/2018 Date of Consult: 10/10/2018  Primary Care Provider: Lajean Manes, MD Primary Cardiologist: Candee Furbish, MD  Primary Electrophysiologist:  None  Valve Clinic: Dr. Angelena Form    Patient Profile:   [name] is a 85 y.o. male with a hx of severe Aortic stenosis s/p TAVR, Aortic regurgitation, mild nonobstructive CAD, chronic diastolic HF, rheumatoid arthritis, HTN, HLD and GERD admitted for symptomatic cholelithiasis, who is being seen today for the evaluation of perioperative evaluation/ risk assessment, prior to anticipated lap chole, at the request of Dr. Georgette Dover, General Surgery.   History of Present Illness:   [name] is an 85 y/o [place] male with a h/o severe aortic stenosis, s/p recent TAVR 02/2018 w/ subsequent moderate perivalvular regurgitation noted on f/u echo 04/2018 w/ normal LVEF 60-10%, chronic diastolic HF, mild nonobstructive CAD by [facility] 8/19 (20% prox LAD, 20% prox Cx and 20% ostial 1st Mrg), HTN, rheumatoid arthritis, HLD, GERD and recent admission 4/11 for acute choledocholithiasis s/p ERCP 09/25/18 with complete removal of stones w/ biliary sphincterotomy and balloon extraction. He was discharged home on antibiotics with plans to later undergo cholecystectomy as an outpatient, on an elective basis. However, he was readmitted on 4/28 with worsening RUQ pain. General surgery has recommended lap chole. Cardiology asked to evaluate for preoperative risk assessment.   In the ED, pt denied fever, chills, SOB and CP. He had nausea but no vomiting. EKG showed NSR. He has remained afebrile. WBC ct WNL. BP has been elevated as high as the 932T systolic. Renal function and K WNL.   Today, pt denies CP and SOB. States that he is eager to get surgery done, hopefully today. Also spoke with RN who was present at bedside during encounter. Pt has not endorsed CP to her. No events  overnight.     Past Medical History:  Diagnosis Date  . BPH (benign prostatic hypertrophy)   . Depression   . Diverticulosis of colon   . GERD (gastroesophageal reflux disease)   . Gilbert's syndrome   . History of kidney stones   . HLD (hyperlipidemia)   . HOH (hard of hearing)    refuses to wear his Hearing Aid  . Hypertension   . Inguinal hernia    right  . Internal hemorrhoid   . Normal coronary arteries    a. Cath 07/2010: normal coronaries. Felt to have noncardiac CP/SOB at that time possibly r/t anxiety.  . RA (rheumatoid arthritis) ([place])    bilateral hands/ wrist--  seronegative  . S/P TAVR (transcatheter aortic valve replacement) 03/27/2018   23 mm Edwards Sapien 3 transcatheter heart valve placed via percutaneous right transfemoral approach   . Severe aortic stenosis   . Thyroid nodule    noted 11-2009  . Wears dentures     Past Surgical History:  Procedure Laterality Date  . CARDIAC CATHETERIZATION  07-19-2010  dr Tressia Miners turner   normal coronary arteries,  ef 60%,  moderate aortic stenosis- gradient 20mmHg, normal right heart pressure  . CARDIOVASCULAR STRESS TEST  05/ 2011   dr Marlou Porch   normal low risk perfusion study  . CATARACT EXTRACTION W/ INTRAOCULAR LENS  IMPLANT, BILATERAL  2013  . CATARACT EXTRACTION, BILATERAL    . COLONOSCOPY  2010  approx  . ERCP N/A 09/25/2018   Procedure: ENDOSCOPIC RETROGRADE CHOLANGIOPANCREATOGRAPHY (ERCP);  Surgeon: Clarene Essex, MD;  Location: [facility];  Service: Endoscopy;  Laterality: N/A;  .  INGUINAL HERNIA REPAIR Right 08/23/2018   Procedure: OPEN RIGHT INGUINAL HERNIA REPAIR WITH MESH;  Surgeon: Armandina Gemma, MD;  Location: [facility];  Service: General;  Laterality: Right;  . INTRAOPERATIVE TRANSTHORACIC ECHOCARDIOGRAM N/A 03/27/2018   Procedure: INTRAOPERATIVE TRANSTHORACIC ECHOCARDIOGRAM;  Surgeon: Burnell Blanks, MD;  Location: [place];  Service: Open Heart Surgery;  Laterality: N/A;  . REMOVAL OF STONES  09/25/2018    Procedure: REMOVAL OF STONES;  Surgeon: Clarene Essex, MD;  Location: [place];  Service: Endoscopy;;  . RIGHT/LEFT HEART CATH AND CORONARY ANGIOGRAPHY N/A 02/06/2018   Procedure: RIGHT/LEFT HEART CATH AND CORONARY ANGIOGRAPHY;  Surgeon: Burnell Blanks, MD;  Location: [facility] [facility];  Service: Cardiovascular;  Laterality: N/A;  . SPHINCTEROTOMY  09/25/2018   Procedure: SPHINCTEROTOMY;  Surgeon: Clarene Essex, MD;  Location: [place];  Service: Endoscopy;;  . THYROID LOBECTOMY Right 05/05/2015   Procedure: RIGHT THYROID LOBECTOMY;  Surgeon: Armandina Gemma, MD;  Location: [place];  Service: General;  Laterality: Right;  . TRANSANAL HEMORRHOIDAL DEARTERIALIZATION N/A 04/09/2014   Procedure: TRANSANAL HEMORRHOIDAL DEARTERIALIZATION OF INTERNAL HEMORROIDS;  Surgeon: Leighton Ruff, MD;  Location: [facility];  Service: General;  Laterality: N/A;  . TRANSCATHETER AORTIC VALVE REPLACEMENT, TRANSFEMORAL  03/27/2018  . TRANSCATHETER AORTIC VALVE REPLACEMENT, TRANSFEMORAL N/A 03/27/2018   Procedure: TRANSCATHETER AORTIC VALVE REPLACEMENT, TRANSFEMORAL. Edwards SAPIEN 3 Transcatheter Heart Valve 77mm.;  Surgeon: Burnell Blanks, MD;  Location: [place];  Service: Open Heart Surgery;  Laterality: N/A;  . TRANSTHORACIC ECHOCARDIOGRAM  11-26-2013   moderate focal basal and mild LVH/  ef 36-14%/  grade I diastolic dysfunction/ mild LAE/  moderate calcification with stenosis AV with mild regurg,  gradients 35 abd 58 mmHg /  mild TR     Home Medications:  Prior to Admission medications   Medication Sig Start Date End Date Taking? Authorizing Provider  Ascorbic Acid (VITAMIN C) 1000 MG tablet Take 1,000 mg by mouth daily after lunch.   Yes [provider]  aspirin 81 MG tablet Take 1 tablet (81 mg total) by mouth daily. Patient taking differently: Take 81 mg by mouth daily.  06/05/18  Yes Barrett, Evelene Croon, PA-C  cholecalciferol (VITAMIN D3) 25 MCG (1000 UT) tablet Take  1,000 Units by mouth daily.   Yes [provider]  dutasteride (AVODART) 0.5 MG capsule Take 0.5 mg by mouth daily.   Yes [provider]  escitalopram (LEXAPRO) 20 MG tablet Take 1 tablet (20 mg total) by mouth daily. Patient taking differently: Take 20 mg by mouth at bedtime.  09/06/16  Yes Angiulli, Lavon Paganini, PA-C  folic acid (FOLVITE) 1 MG tablet Take 1 mg by mouth every evening.  01/27/15  Yes [provider]  furosemide (LASIX) 20 MG tablet Take 20 mg by mouth.   Yes [provider]  losartan (COZAAR) 25 MG tablet Take 25 mg by mouth daily.  01/21/18  Yes [provider]  Melatonin 5 MG TABS Take 5 mg by mouth at bedtime as needed (for sleep).    Yes [provider]  Omega-3 Fatty Acids (FISH OIL) 1000 MG CAPS Take 1,000 mg by mouth daily.   Yes [provider]  vitamin E 400 UNIT capsule Take 400 Units by mouth daily after lunch.   Yes [provider]  amoxicillin-clavulanate (AUGMENTIN) 875-125 MG tablet Take 1 tablet by mouth every 12 (twelve) hours. Patient not taking: Reported on 10/09/2018 09/26/18   Geradine Girt, DO  pantoprazole (PROTONIX) 40  MG tablet Take 1 tablet (40 mg total) by mouth daily. Patient not taking: Reported on 10/09/2018 09/27/18   Geradine Girt, DO  traMADol (ULTRAM) 50 MG tablet Take 1-2 tablets (50-100 mg total) by mouth every 6 (six) hours as needed. Patient not taking: Reported on 10/09/2018 08/23/18   Armandina Gemma, MD    Inpatient Medications: Scheduled Meds: . aspirin EC  81 mg Oral Daily  . cholecalciferol  1,000 Units Oral Daily  . dutasteride  0.5 mg Oral Daily  . escitalopram  20 mg Oral QHS  . folic acid  1 mg Oral QPM  . losartan  25 mg Oral Daily  . omega-3 acid ethyl esters  1 g Oral Daily  . vitamin C  1,000 mg Oral QPC lunch  . vitamin E  400 Units Oral QPC lunch   Continuous Infusions:  PRN Meds: acetaminophen **OR** acetaminophen, hydrALAZINE, Melatonin, morphine  injection, ondansetron **OR** ondansetron (ZOFRAN) IV, oxyCODONE-acetaminophen, senna-docusate  Allergies:    Allergies  Allergen Reactions  . Ativan [Lorazepam] Other (See Comments)    hallucinations  . Phenergan [Promethazine Hcl] Hypertension  . Eggs Or Egg-Derived Products Rash    Small rash after eating for several days in a row    Social History:   Social History   Socioeconomic History  . Marital status: Widowed    Spouse name: Not on file  . Number of children: Not on file  . Years of education: Not on file  . Highest education level: Not on file  Occupational History  . Not on file  Social Needs  . Financial resource strain: Not on file  . Food insecurity:    Worry: Not on file    Inability: Not on file  . Transportation needs:    Medical: Not on file    Non-medical: Not on file  Tobacco Use  . Smoking status: Former Smoker    Years: 40.00    Types: Cigarettes    Start date: 06/13/1973    Last attempt to quit: 04/03/1981    Years since quitting: 37.5  . Smokeless tobacco: Never Used  Substance and Sexual Activity  . Alcohol use: Yes    Comment: occasional  . Drug use: No  . Sexual activity: Not on file  Lifestyle  . Physical activity:    Days per week: Not on file    Minutes per session: Not on file  . Stress: Not on file  Relationships  . Social connections:    Talks on phone: Not on file    Gets together: Not on file    Attends religious service: Not on file    Active member of club or organization: Not on file    Attends meetings of clubs or organizations: Not on file    Relationship status: Not on file  . Intimate partner violence:    Fear of current or ex partner: Not on file    Emotionally abused: Not on file    Physically abused: Not on file    Forced sexual activity: Not on file  Other Topics Concern  . Not on file  Social History Narrative  . Not on file    Family History:    Family History  Problem Relation Age of Onset  .  Pulmonary embolism Mother 39       caused by complications of surgery  . CVA Father   . Diabetes Brother   . Diabetes Brother   . Breast cancer Sister  ROS:  Please see the history of present illness.   All other ROS reviewed and negative.     Physical Exam/Data:   Vitals:   10/09/18 2030 10/09/18 2132 10/09/18 2225 10/10/18 0433  BP: (!) 139/53 (!) 159/75 (!) 178/61 (!) 163/54  Pulse: (!) 58 (!) 59 (!) 57 (!) 55  Resp: (!) 22  (!) 22 18  Temp:   98.4 F (36.9 C) 98.5 F (36.9 C)  TempSrc:   Oral Oral  SpO2: 94% 96% 99% 97%  Weight:   72.8 kg     Intake/Output Summary (Last 24 hours) at 10/10/2018 1113 Last data filed at 10/10/2018 0900 Gross per 24 hour  Intake 543.61 ml  Output 125 ml  Net 418.61 ml   Last 3 Weights 10/09/2018 10/09/2018 09/26/2018  Weight (lbs) 160 lb 8 oz 165 lb 9.1 oz 165 lb 9.1 oz  Weight (kg) 72.802 kg 75.1 kg 75.1 kg     Body mass index is 28.44 kg/m.  General:  Well nourished, well developed, in no acute distress HEENT: normal Lymph: no adenopathy Neck: no JVD Endocrine:  No thryomegaly Vascular: No carotid bruits Cardiac:  normal S1, S2; RRR; 2/6 systolic murmur Lungs:  clear to auscultation bilaterally, no wheezing, rhonchi or rales  Abd: soft, nontender, no hepatomegaly  Ext: no edema Musculoskeletal:  No deformities, BUE and BLE strength normal and equal Skin: warm and dry  Neuro:  CNs 2-12 intact, no focal abnormalities noted Psych:  Normal affect   EKG:  The EKG was personally reviewed and demonstrates:  NSR 63 bpm 10/09/18 Telemetry:  Telemetry was personally reviewed and demonstrates:  NSR  Relevant CV Studies: [facility] 02/06/18 RIGHT/LEFT HEART CATH AND CORONARY ANGIOGRAPHY  Conclusion     Prox Cx lesion is 20% stenosed.  Ost 1st Mrg lesion is 20% stenosed.  Prox LAD lesion is 20% stenosed.   1. Mild non-obstructive CAD 2. Severe aortic stenosis (mean gradient 54.9 mmHg, peak to peak gradient 53 mmHg, AVA 0.60 cm2)  3. Mean wedge pressure 12 mmHg   ________________ TAVR 03/27/18 (See Op Report)   _________________ Most Recent Transthoracic Echocardiogram 04/25/18 Study Conclusions  - Left ventricle: The cavity size was normal. There was mild   concentric hypertrophy. Systolic function was normal. The   estimated ejection fraction was in the range of 60% to 65%. Wall   motion was normal; there were no regional wall motion   abnormalities. Doppler parameters are consistent with abnormal   left ventricular relaxation (grade 1 diastolic dysfunction). - Aortic valve: S/P TAVR. A prosthesis was present and functioning   normally. The prosthesis had a normal range of motion. The sewing   ring appeared normal, had no rocking motion, and showed no   evidence of dehiscence. There was moderate perivalvular   regurgitation. Mean gradient (S): 14 mm Hg. Regurgitation   pressure half-time: 386 ms. - Left atrium: The atrium was mildly dilated. - Atrial septum: No defect or patent foramen ovale was identified. - Pulmonary arteries: PA peak pressure: 32 mm Hg (S).  Impressions:  - S/P TAVR. On this study, there is moderate aortic regurgitation   that appears most likel perivalvular in nature. It was not seen   on prior study. Mean gradient is similar to prior  Laboratory Data:  Chemistry Recent Labs  Lab 10/09/18 1736 10/10/18 0233  NA 136 135  K 4.8 4.0  CL 100 103  CO2 27 25  GLUCOSE 150* 133*  BUN 14 11  CREATININE  1.20 1.01  CALCIUM 9.2 8.8*  GFRNONAA 54* >60  GFRAA >60 >60  ANIONGAP 9 7    Recent Labs  Lab 10/09/18 1736  PROT 6.9  ALBUMIN 3.6  AST 21  ALT 20  ALKPHOS 88  BILITOT 1.1   Hematology Recent Labs  Lab 10/09/18 1736 10/10/18 0233  WBC 8.6 8.1  RBC 4.26 4.00*  HGB 12.4* 11.6*  HCT 39.0 35.8*  MCV 91.5 89.5  MCH 29.1 29.0  MCHC 31.8 32.4  RDW 12.2 12.0  PLT 207 187   Cardiac EnzymesNo results for input(s): TROPONINI in the last 168 hours. No results for  input(s): TROPIPOC in the last 168 hours.  BNP Recent Labs  Lab 10/09/18 2017  BNP 52.5    DDimer No results for input(s): DDIMER in the last 168 hours.  Radiology/Studies:  US Abdomen Limited Ruq  Result Date: 10/09/2018 CLINICAL DATA:  Initial evaluation for acute right upper quadrant pain. EXAM: ULTRASOUND ABDOMEN LIMITED RIGHT UPPER QUADRANT COMPARISON:  Prior MRI from 09/24/2018 FINDINGS: Gallbladder: Nonshadowing layering echogenic material within the gallbladder lumen most consistent with sludge. No frank shadowing cholelithiasis. Gallbladder wall measures within normal limits of 1.8 mm. No free pericholecystic fluid. No sonographic Murphy sign elicited on exam. Common bile duct: Diameter: 5.5 mm.  No choledocholithiasis. Liver: No focal lesion identified. Within normal limits in parenchymal echogenicity. Portal vein is patent on color Doppler imaging with normal direction of blood flow towards the liver. Incidental note made of 2 simple right renal cysts measuring 1.6 x 2.1 x 1.9 cm and 3.7 x 3.5 x 2.9 cm. IMPRESSION: 1. Gallbladder sludge without frank cholelithiasis. No sonographic features to suggest acute cholecystitis. 2. No biliary dilatation or choledocholithiasis by ultrasound. 3. Incidental simple right renal cysts as above. Electronically Signed   By: Jeannine Boga M.D.   On: 10/09/2018 18:42    Assessment and Plan:   [name] is a 85 y.o. male with a hx of severe Aortic stenosis s/p TAVR, Aortic regurgitation, mild nonobstructive CAD, chronic diastolic HF, rheumatoid arthritis, HTN, HLD and GERD admitted for symptomatic cholelithiasis, who is being seen today for the evaluation of perioperative evaluation/ risk assessment, prior to anticipated lap chole, at the request of Dr. Georgette Dover, General Surgery.   1. Symptomatic Cholelithiasis: recently admitted 4/11 for acute choledocholithiasis s/p ERCP 09/25/18 with complete removal of stones w/ biliary sphincterotomy and  balloon extraction. He was discharged home on antibiotics with plans to later undergo cholecystectomy as an outpatient, on an elective basis. However, he was readmitted on 4/28 with worsening RUQ pain. General surgery has recommended lap chole.  2. H/o Severe Aortic Stenosis, S/p TAVR: procedure date 03/27/18. F/u echo 04/2018 demonstrated prosthesis was present and functioning normally. The prosthesis had a normal range of motion. The sewing ring appeared normal, had no rocking motion, and showed no evidence of dehiscence. There was moderate perivalvular regurgitation. Mean gradient (S): 14 mm Hg. Regurgitation pressure half-time: 386 ms. He denies CP and dyspnea.   3. Aortic Regurgitation: There was moderate perivalvular regurgitation noted on f/u echo 04/2018 following TAVR. Mean gradient (S): 14 mm Hg. Regurgitation pressure half-time: 386 ms.  4. Chronic Diastolic HF: N4OE noted on echo 04/2018. BNP in the ED yesterday was normal at 52. Home Lasix currently on hold, per admitting team. Recommend judicious use of IVFs during the perioperative period. Monitor volume status closely.   5. CAD: mild nonobstructive CAD was noted on LHC prior to undergoing TAVR.  20% prox LAD, 20%  prox Cx and 20% ostial 1st Mrg. ASA ordered. Not on  blocker nor statin, per home med list. He denies CP.   6. HTN: BP has been elevated, but pain may be contributing. Pain meds and IV hydralazine ordered by primary team. Monitor closely.   7. HLD: on Lovaza for hypertriglyceridemia. Not on statin. No recent lipid panel on file. We can assess later as an outpatient.  LDL goal < 70 mg/dl.   8. Perioperative Assessment/ Risk Stratification: pt is s/p TAVR for severe aortic stenosis and had fairly recent LHC, less than 1 year ago, showing mild nonobstructive CAD and recent echo showed normal EF and stable aortic valve prosthesis with moderate perivalvular regurgitation. He denies any recent cardiac symptoms. No CP or dyspnea. EKG  shows NSR. No ischemic abnormalities. He would be of acceptable risk to proceed w/ noncardiac surgery. No indication for additional cardiac testing. OK to proceed with surgery with moderate cardiac risk. Continue ASA given TAVR valve.   Note was prepared with assistance by Lyda Jester, PA.   OK to proceed with surgery. Continue with tele. Spoke with daughter on phone. Questions answered.   For questions or updates, please contact [place] Please consult [email or web address] for contact info under     Signed, Candee Furbish, MD  10/10/2018 11:13 AM

## 2018-10-10 NOTE — Progress Notes (Signed)
   Subjective/Chief Complaint: Complains of RUQ pain   Objective: Vital signs in last 24 hours: Temp:  [98.4 F (36.9 C)-98.5 F (36.9 C)] 98.5 F (36.9 C) (04/29 0433) Pulse Rate:  [55-72] 55 (04/29 0433) Resp:  [14-22] 18 (04/29 0433) BP: (125-178)/(51-80) 163/54 (04/29 0433) SpO2:  [93 %-99 %] 97 % (04/29 0433) Weight:  [72.8 kg-75.1 kg] 72.8 kg (04/28 2225)    Intake/Output from previous day: 04/28 0701 - 04/29 0700 In: 543.6 [IV Piggyback:543.6] Out: 125 [Urine:125] Intake/Output this shift: No intake/output data recorded.  General appearance: alert and cooperative Resp: clear to auscultation bilaterally Cardio: regular rate and rhythm GI: soft, mild RUQ tenderness  Lab Results:  Recent Labs    10/09/18 1736 10/10/18 0233  WBC 8.6 8.1  HGB 12.4* 11.6*  HCT 39.0 35.8*  PLT 207 187   BMET Recent Labs    10/09/18 1736 10/10/18 0233  NA 136 135  K 4.8 4.0  CL 100 103  CO2 27 25  GLUCOSE 150* 133*  BUN 14 11  CREATININE 1.20 1.01  CALCIUM 9.2 8.8*   PT/INR Recent Labs    10/09/18 2017  LABPROT 13.8  INR 1.1   ABG No results for input(s): PHART, HCO3 in the last 72 hours.  Invalid input(s): PCO2, PO2  Studies/Results: US Abdomen Limited Ruq  Result Date: 10/09/2018 CLINICAL DATA:  Initial evaluation for acute right upper quadrant pain. EXAM: ULTRASOUND ABDOMEN LIMITED RIGHT UPPER QUADRANT COMPARISON:  Prior MRI from 09/24/2018 FINDINGS: Gallbladder: Nonshadowing layering echogenic material within the gallbladder lumen most consistent with sludge. No frank shadowing cholelithiasis. Gallbladder wall measures within normal limits of 1.8 mm. No free pericholecystic fluid. No sonographic Murphy sign elicited on exam. Common bile duct: Diameter: 5.5 mm.  No choledocholithiasis. Liver: No focal lesion identified. Within normal limits in parenchymal echogenicity. Portal vein is patent on color Doppler imaging with normal direction of blood flow towards  the liver. Incidental note made of 2 simple right renal cysts measuring 1.6 x 2.1 x 1.9 cm and 3.7 x 3.5 x 2.9 cm. IMPRESSION: 1. Gallbladder sludge without frank cholelithiasis. No sonographic features to suggest acute cholecystitis. 2. No biliary dilatation or choledocholithiasis by ultrasound. 3. Incidental simple right renal cysts as above. Electronically Signed   By: Jeannine Boga M.D.   On: 10/09/2018 18:42    Anti-infectives: Anti-infectives (From admission, onward)   None      Assessment/Plan: s/p * No surgery found * Pt seems to have bililary colic that is recurring. He has already had ERCP. Since his pain keeps recurring I think it is reasonable to remove gallbladder. Cardiology to see for clearance. Will discuss with family the timing of the surgery. They apparently wanted to wait the last time we had him scheduled.  LOS: 1 day    Autumn Messing III 10/10/2018

## 2018-10-10 NOTE — Progress Notes (Signed)
Patient ID: [name], male   DOB: [birth year], 85 y.o.   MRN: [id number] Spoke with family about surgery tomorrow. I discussed in detail the risks and benefits as well as some of the technical aspects and they understand and wish to proceed

## 2018-10-10 NOTE — H&P (View-Only) (Signed)
Patient ID: [name], male   DOB: Aug 27, 1927, 85 y.o.   MRN: [id number] Spoke with family about surgery tomorrow. I discussed in detail the risks and benefits as well as some of the technical aspects and they understand and wish to proceed

## 2018-10-10 NOTE — Progress Notes (Signed)
PROGRESS NOTE    [name]  DJM:[id number] DOB: [birth year] DOA: 10/09/2018 PCP: Lajean Manes, MD   Brief Narrative:  Per HPI: [name] is a 85 y.o. male with medical history significant of HTN, BPH, GERD, rheumatoid arthritis,dCHF(EF 60-65% 04/25/18), aortic stenosis s/p TAVR 03/27/2018, hyperlipidemia, depression, Gilbert syndrome, hard of hearing, CKD-III, who presents with abdominal pain.  Pt has HOH and is a poor historian. I called family and spoke with her daughter and granddaughter to have collected more information from them. Patient was recently hospitalized from 4/11-4/15 due to choledocholithiasis. He had MRCP revealedcholedocholithiasis. GI performed ERCP 4/14 with complete removalof stones withbiliary sphincterotomy and balloon extraction.  General surgeon was consulted, recommended lap chole, however the patient's family did not want him to have surgery due to [place] restrictions and the language barrier. Per family, patient developed abdominal pain again for more than 24 hours, which is located right upper quadrant, moderate, sharp, nonradiating.  Patient has nausea, and dry heaves no vomiting or diarrhea.  Patient does not have fever or chills.  He does not have chest pain, shortness of breath, cough.  No symptoms of UTI or unilateral weakness.  ED Course: pt was found to have WBC 8.6, lactic acid 1.0, lipase 39, normal LFT, negative urinalysis, stable renal function, temperature normal, heart rate 55, oxygen saturation 93-98% on room air.  Patient is admitted to telemetry bed as inpatient.  General surgeon, Dr. Georgette Dover was consulted.   Assessment & Plan:   Principal Problem:   Abdominal pain Active Problems:   Benign essential HTN   S/P TAVR (transcatheter aortic valve replacement)   GERD (gastroesophageal reflux disease)   Benign prostatic hyperplasia   Depression   Chronic diastolic CHF (congestive heart failure) (HCC)   CKD (chronic kidney disease),  stage III (HCC)   Abdominal pain/symptomatic cholelithiasis.  I agree this is likely due to symptomatic cholelithiasis with sludge is identified on the right upper quadrant ultrasound.  General surgery saw the patient in consultation plan for surgery tomorrow.  I discussed the case with patient's daughter who is in agreement with plan of care.  Continue with analgesics, antiemetics, n.p.o. status  Hypertension continue patient's home medications include Cozaar.  IV hydralazine PRN with parameters  Chronic diastolic CHF not in acute exacerbation: Patient is n.p.o. holding Lasix will monitor volume status closely, follow BMPs daily.  Status post TAVR.  No acute issues patient was seen by cardiology aortic valve stable.  CKD stage III.  Avoid nephrotoxic agents, follow BMPs as above.  DVT prophylaxis: SCD/Compression stockings  Code Status: DNR    Code Status Orders  (From admission, onward)         Start     Ordered   10/09/18 2006  Do not attempt resuscitation (DNR)  Continuous    Question Answer Comment  In the event of cardiac or respiratory ARREST Do not call a code blue   In the event of cardiac or respiratory ARREST Do not perform Intubation, CPR, defibrillation or ACLS   In the event of cardiac or respiratory ARREST Use medication by any route, position, wound care, and other measures to relive pain and suffering. May use oxygen, suction and manual treatment of airway obstruction as needed for comfort.      10/09/18 2005        Code Status History    Date Active Date Inactive Code Status Order ID Comments User Context   09/22/2018 1544 09/26/2018 1905 DNR [id number]  Patrecia Pour, Christean Grief,  MD ED   06/04/2018 2340 06/05/2018 1542 Full Code [id number]  Meade Maw, MD Inpatient   03/27/2018 1206 03/28/2018 1518 Full Code [id number]  Crista Luria Inpatient   02/04/2018 1537 02/07/2018 2109 Full Code [id number]  Sid Falcon, MD Inpatient   08/28/2016 1218  09/06/2016 1711 Full Code [id number]  Cathlyn Parsons, PA-C Inpatient   08/28/2016 1218 08/28/2016 1218 Full Code [id number]  Elizabeth Sauer Inpatient   08/13/2016 2032 08/28/2016 1216 Full Code [id number]  Rolm Bookbinder, MD Inpatient   05/05/2015 1142 05/06/2015 1303 Full Code [id number]  Armandina Gemma, MD Inpatient     Family Communication: By phone Disposition Plan:  Patient will remain inpatient with plan surgical intervention tomorrow for laparoscopic cholecystectomy with possible conversion open.  We will continue n.p.o. status, IV analgesics, IV fluids and labs. Consults called: None Admission status: Inpatient   Consultants:   General surgery  Procedures:  Mr 3d Recon At Scanner  Result Date: 09/24/2018 CLINICAL DATA:  85 year old male with history of common bile duct stone and biliary obstruction. EXAM: MRI ABDOMEN WITHOUT AND WITH CONTRAST (INCLUDING MRCP) TECHNIQUE: Multiplanar multisequence MR imaging of the abdomen was performed both before and after the administration of intravenous contrast. Heavily T2-weighted images of the biliary and pancreatic ducts were obtained, and three-dimensional MRCP images were rendered by post processing. CONTRAST:  8 mL of Gadavist. COMPARISON:  No priors. Abdominal ultrasound 09/22/2018. CT the abdomen and pelvis 02/07/2018. FINDINGS: Comment: Today's study is slightly limited by patient respiratory motion. Lower chest: Unremarkable. Hepatobiliary: No suspicious cystic or solid hepatic lesions. No intrahepatic biliary ductal dilatation. Common bile duct is borderline dilated measuring 7 mm in the porta hepatis (within normal limits for the patient's age). Several small filling defects are noted in the common bile duct, largest of which measures up to 5 mm distally shortly before the ampulla. In addition, amorphous filling defects are noted in the gallbladder, indicative of a combination of biliary sludge and small gallstones. Gallbladder is  moderately distended, with some mild gallbladder wall thickening (6 mm) and increased T2 signal intensity in the wall, indicative of edema. Trace volume of T2 signal intensity adjacent to the gallbladder, indicative of pericholecystic fluid. This is contiguous with increased T2 signal intensity adjacent to the descending portion of the duodenum. Pancreas: No pancreatic mass. Small amount of increased T2 signal intensity adjacent to the head of the pancreas and the adjacent descending duodenum. No pancreatic ductal dilatation noted on MRCP images. No peripancreatic fluid collections. Spleen:  Unremarkable. Adrenals/Urinary Tract: Multiple T1 hypointense, T2 hyperintense, nonenhancing lesions in both kidneys, compatible with simple cysts, largest of which is exophytic in the anterior aspect of the interpolar region of the right kidney measuring 3.6 cm in diameter. No definite suspicious renal lesions. No hydroureteronephrosis in the visualized portions of the abdomen. Bilateral adrenal glands are normal in appearance. Stomach/Bowel: Normal appearance of the stomach. Increased T2 signal intensity surrounding the descending portion of the duodenum. Remaining portions of visualized small bowel and colon are unremarkable. Vascular/Lymphatic: Aortic atherosclerosis, without evidence of aneurysm in the abdominal vasculature. No lymphadenopathy noted in the abdomen. Other: No significant volume of ascites in the visualized portions of the peritoneal cavity. Musculoskeletal: No aggressive appearing osseous lesions are noted in the visualized portions of the skeleton. IMPRESSION: 1. Study is positive for biliary sludge and cholelithiasis as well as choledocholithiasis. At this time, there is no evidence of frank biliary tract obstruction despite the presence of common bile duct  stones measuring up to 5 mm in the periampullary region. Notably, the gallbladder is moderately distended with some mild gallbladder wall thickening  and edema, as well as trace amount of pericholecystic fluid. There is also some fluid adjacent to the head of the pancreas and descending portion of the duodenum, which could indicate an associated pancreatitis. 2. Aortic atherosclerosis. Electronically Signed   By: Vinnie Langton M.D.   On: 09/24/2018 11:50   Dg Chest Port 1 View  Result Date: 09/22/2018 CLINICAL DATA:  Acute onset of mid chest pain that began last night. Current history of CHF and hypertension. Prior [place]. EXAM: PORTABLE CHEST 1 VIEW COMPARISON:  CTA chest 06/04/2018. Chest x-rays 06/04/2018 and earlier. FINDINGS: Cardiac silhouette normal in size, unchanged. Prior CABG. Thoracic aorta mildly atherosclerotic and tortuous. Hilar and mediastinal contours otherwise unremarkable. Mildly prominent bronchovascular markings diffusely and mild central peribronchial thickening, unchanged. Lungs otherwise clear. No localized airspace consolidation. No pleural effusions. No pneumothorax. Normal pulmonary vascularity. IMPRESSION: Stable mild changes of chronic bronchitis and/or asthma. No acute cardiopulmonary disease. Electronically Signed   By: Evangeline Dakin M.D.   On: 09/22/2018 11:25   Dg Ercp Biliary & Pancreatic Ducts  Result Date: 09/25/2018 CLINICAL DATA:  Common bile duct calculus EXAM: ERCP TECHNIQUE: Multiple spot images obtained with the fluoroscopic device and submitted for interpretation post-procedure. FLUOROSCOPY TIME:  Fluoroscopy Time:  3 minutes and 17 seconds Radiation Exposure Index (if provided by the fluoroscopic device): Number of Acquired Spot Images: 0 COMPARISON:  None. FINDINGS: Images demonstrate cannulation of the common bile duct followed by balloon stone retrieval. IMPRESSION: See above. These images were submitted for radiologic interpretation only. Please see the procedural report for the amount of contrast and the fluoroscopy time utilized. Electronically Signed   By: Marybelle Killings M.D.   On: 09/25/2018 14:41     Mr Abdomen Mrcp Moise Boring Contast  Result Date: 09/24/2018 CLINICAL DATA:  85 year old male with history of common bile duct stone and biliary obstruction. EXAM: MRI ABDOMEN WITHOUT AND WITH CONTRAST (INCLUDING MRCP) TECHNIQUE: Multiplanar multisequence MR imaging of the abdomen was performed both before and after the administration of intravenous contrast. Heavily T2-weighted images of the biliary and pancreatic ducts were obtained, and three-dimensional MRCP images were rendered by post processing. CONTRAST:  8 mL of Gadavist. COMPARISON:  No priors. Abdominal ultrasound 09/22/2018. CT the abdomen and pelvis 02/07/2018. FINDINGS: Comment: Today's study is slightly limited by patient respiratory motion. Lower chest: Unremarkable. Hepatobiliary: No suspicious cystic or solid hepatic lesions. No intrahepatic biliary ductal dilatation. Common bile duct is borderline dilated measuring 7 mm in the porta hepatis (within normal limits for the patient's age). Several small filling defects are noted in the common bile duct, largest of which measures up to 5 mm distally shortly before the ampulla. In addition, amorphous filling defects are noted in the gallbladder, indicative of a combination of biliary sludge and small gallstones. Gallbladder is moderately distended, with some mild gallbladder wall thickening (6 mm) and increased T2 signal intensity in the wall, indicative of edema. Trace volume of T2 signal intensity adjacent to the gallbladder, indicative of pericholecystic fluid. This is contiguous with increased T2 signal intensity adjacent to the descending portion of the duodenum. Pancreas: No pancreatic mass. Small amount of increased T2 signal intensity adjacent to the head of the pancreas and the adjacent descending duodenum. No pancreatic ductal dilatation noted on MRCP images. No peripancreatic fluid collections. Spleen:  Unremarkable. Adrenals/Urinary Tract: Multiple T1 hypointense, T2 hyperintense,  nonenhancing  lesions in both kidneys, compatible with simple cysts, largest of which is exophytic in the anterior aspect of the interpolar region of the right kidney measuring 3.6 cm in diameter. No definite suspicious renal lesions. No hydroureteronephrosis in the visualized portions of the abdomen. Bilateral adrenal glands are normal in appearance. Stomach/Bowel: Normal appearance of the stomach. Increased T2 signal intensity surrounding the descending portion of the duodenum. Remaining portions of visualized small bowel and colon are unremarkable. Vascular/Lymphatic: Aortic atherosclerosis, without evidence of aneurysm in the abdominal vasculature. No lymphadenopathy noted in the abdomen. Other: No significant volume of ascites in the visualized portions of the peritoneal cavity. Musculoskeletal: No aggressive appearing osseous lesions are noted in the visualized portions of the skeleton. IMPRESSION: 1. Study is positive for biliary sludge and cholelithiasis as well as choledocholithiasis. At this time, there is no evidence of frank biliary tract obstruction despite the presence of common bile duct stones measuring up to 5 mm in the periampullary region. Notably, the gallbladder is moderately distended with some mild gallbladder wall thickening and edema, as well as trace amount of pericholecystic fluid. There is also some fluid adjacent to the head of the pancreas and descending portion of the duodenum, which could indicate an associated pancreatitis. 2. Aortic atherosclerosis. Electronically Signed   By: Vinnie Langton M.D.   On: 09/24/2018 11:50   US Abdomen Limited Ruq  Result Date: 10/09/2018 CLINICAL DATA:  Initial evaluation for acute right upper quadrant pain. EXAM: ULTRASOUND ABDOMEN LIMITED RIGHT UPPER QUADRANT COMPARISON:  Prior MRI from 09/24/2018 FINDINGS: Gallbladder: Nonshadowing layering echogenic material within the gallbladder lumen most consistent with sludge. No frank shadowing  cholelithiasis. Gallbladder wall measures within normal limits of 1.8 mm. No free pericholecystic fluid. No sonographic Murphy sign elicited on exam. Common bile duct: Diameter: 5.5 mm.  No choledocholithiasis. Liver: No focal lesion identified. Within normal limits in parenchymal echogenicity. Portal vein is patent on color Doppler imaging with normal direction of blood flow towards the liver. Incidental note made of 2 simple right renal cysts measuring 1.6 x 2.1 x 1.9 cm and 3.7 x 3.5 x 2.9 cm. IMPRESSION: 1. Gallbladder sludge without frank cholelithiasis. No sonographic features to suggest acute cholecystitis. 2. No biliary dilatation or choledocholithiasis by ultrasound. 3. Incidental simple right renal cysts as above. Electronically Signed   By: Jeannine Boga M.D.   On: 10/09/2018 18:42   US Abdomen Limited Ruq  Result Date: 09/22/2018 CLINICAL DATA:  Elevated liver function test. EXAM: ULTRASOUND ABDOMEN LIMITED RIGHT UPPER QUADRANT COMPARISON:  None. FINDINGS: Gallbladder: Mixed echogenicity material within the gallbladder, presumably sludge. Hyperechoic masslike focus within the gallbladder measures 1.3 x 1.2 cm, most likely tumefactive sludge. No gallbladder wall thickening or pericholecystic fluid. No sonographic Murphy's sign elicited during the exam. Common bile duct: Diameter: 5 mm Liver: Liver echotexture is coarse and there is nodularity of the peripheral liver margins suggesting cirrhosis. No focal mass or lesion within the liver. Portal vein is patent on color Doppler imaging with normal direction of blood flow towards the liver. Incidental note of a RIGHT renal cyst, measuring 3.6 cm, corresponding to a benign cyst demonstrated on CT dated 02/07/2018. IMPRESSION: 1. No evidence of cholecystitis. No discrete gallstones identified. Hyperechoic masslike structure within the gallbladder measures 1.3 cm, most likely tumefactive sludge. Additional layering sludge within the gallbladder. 2. No  bile duct dilatation. 3. Probable liver cirrhosis. Electronically Signed   By: Franki Cabot M.D.   On: 09/22/2018 13:22     Antimicrobials:  Perioperative per general surgery   Subjective: No acute complaints overnight resting comfortably in bed.  General surgery discussed the case with patient's daughter who is a [place] interpreter.  They are in agreement with plan of care.  Objective: Vitals:   10/09/18 2132 10/09/18 2225 10/10/18 0433 10/10/18 1401  BP: (!) 159/75 (!) 178/61 (!) 163/54 (!) 136/105  Pulse: (!) 59 (!) 57 (!) 55 62  Resp:  (!) 22 18 16   Temp:  98.4 F (36.9 C) 98.5 F (36.9 C) 98.2 F (36.8 C)  TempSrc:  Oral Oral Oral  SpO2: 96% 99% 97% 96%  Weight:  72.8 kg      Intake/Output Summary (Last 24 hours) at 10/10/2018 1704 Last data filed at 10/10/2018 1532 Gross per 24 hour  Intake 1063.61 ml  Output 225 ml  Net 838.61 ml   Filed Weights   10/09/18 1657 10/09/18 2225  Weight: 75.1 kg 72.8 kg    Examination:  General exam: Appears calm and comfortable  Respiratory system: Clear to auscultation. Respiratory effort normal. Cardiovascular system: S1 & S2 heard, RRR. No JVD, murmurs, rubs, gallops or clicks. No pedal edema. Gastrointestinal system: Abdomen is nondistended, soft mildly tender right upper quadrant r. No organomegaly or masses felt. Normal bowel sounds heard. Central nervous system: Alert and oriented. No focal neurological deficits. Extremities: Symmetric 5 x 5 power. Skin: No rashes, lesions or ulcers Psychiatry: Judgement and insight appear normal. Mood & affect appropriate.     Data Reviewed: I have personally reviewed following labs and imaging studies  CBC: Recent Labs  Lab 10/09/18 1736 10/10/18 0233  WBC 8.6 8.1  HGB 12.4* 11.6*  HCT 39.0 35.8*  MCV 91.5 89.5  PLT 207 867   Basic Metabolic Panel: Recent Labs  Lab 10/09/18 1736 10/10/18 0233  NA 136 135  K 4.8 4.0  CL 100 103  CO2 27 25  GLUCOSE 150* 133*  BUN  14 11  CREATININE 1.20 1.01  CALCIUM 9.2 8.8*   GFR: Estimated Creatinine Clearance: 45.3 mL/min (by C-G formula based on SCr of 1.01 mg/dL). Liver Function Tests: Recent Labs  Lab 10/09/18 1736  AST 21  ALT 20  ALKPHOS 88  BILITOT 1.1  PROT 6.9  ALBUMIN 3.6   Recent Labs  Lab 10/09/18 1736  LIPASE 39   No results for input(s): AMMONIA in the last 168 hours. Coagulation Profile: Recent Labs  Lab 10/09/18 2017  INR 1.1   Cardiac Enzymes: No results for input(s): CKTOTAL, CKMB, CKMBINDEX, TROPONINI in the last 168 hours. BNP (last 3 results) No results for input(s): PROBNP in the last 8760 hours. HbA1C: No results for input(s): HGBA1C in the last 72 hours. CBG: No results for input(s): GLUCAP in the last 168 hours. Lipid Profile: No results for input(s): CHOL, HDL, LDLCALC, TRIG, CHOLHDL, LDLDIRECT in the last 72 hours. Thyroid Function Tests: No results for input(s): TSH, T4TOTAL, FREET4, T3FREE, THYROIDAB in the last 72 hours. Anemia Panel: No results for input(s): VITAMINB12, FOLATE, FERRITIN, TIBC, IRON, RETICCTPCT in the last 72 hours. Sepsis Labs: Recent Labs  Lab 10/09/18 1736  LATICACIDVEN 1.0    No results found for this or any previous visit (from the past 240 hour(s)).       Radiology Studies: US Abdomen Limited Ruq  Result Date: 10/09/2018 CLINICAL DATA:  Initial evaluation for acute right upper quadrant pain. EXAM: ULTRASOUND ABDOMEN LIMITED RIGHT UPPER QUADRANT COMPARISON:  Prior MRI from 09/24/2018 FINDINGS: Gallbladder: Nonshadowing layering echogenic material within the gallbladder  lumen most consistent with sludge. No frank shadowing cholelithiasis. Gallbladder wall measures within normal limits of 1.8 mm. No free pericholecystic fluid. No sonographic Murphy sign elicited on exam. Common bile duct: Diameter: 5.5 mm.  No choledocholithiasis. Liver: No focal lesion identified. Within normal limits in parenchymal echogenicity. Portal vein is  patent on color Doppler imaging with normal direction of blood flow towards the liver. Incidental note made of 2 simple right renal cysts measuring 1.6 x 2.1 x 1.9 cm and 3.7 x 3.5 x 2.9 cm. IMPRESSION: 1. Gallbladder sludge without frank cholelithiasis. No sonographic features to suggest acute cholecystitis. 2. No biliary dilatation or choledocholithiasis by ultrasound. 3. Incidental simple right renal cysts as above. Electronically Signed   By: Jeannine Boga M.D.   On: 10/09/2018 18:42        Scheduled Meds:  aspirin EC  81 mg Oral Daily   cholecalciferol  1,000 Units Oral Daily   dutasteride  0.5 mg Oral Daily   escitalopram  20 mg Oral QHS   folic acid  1 mg Oral QPM   losartan  25 mg Oral Daily   omega-3 acid ethyl esters  1 g Oral Daily   vitamin C  1,000 mg Oral QPC lunch   vitamin E  400 Units Oral QPC lunch   Continuous Infusions:   LOS: 1 day    Time spent: [place]    Nicolette Bang, MD [facility]  If 7PM-7AM, please contact night-coverage  10/10/2018, 5:04 PM

## 2018-10-11 ENCOUNTER — Inpatient Hospital Stay (HOSPITAL_COMMUNITY): Payer: Medicare Other

## 2018-10-11 ENCOUNTER — Inpatient Hospital Stay (HOSPITAL_COMMUNITY): Payer: Medicare Other | Admitting: Anesthesiology

## 2018-10-11 ENCOUNTER — Encounter (HOSPITAL_COMMUNITY)
Admission: EM | Disposition: A | Discharge status: Home Health | Payer: Self-pay | Source: Home / Self Care | Attending: Internal Medicine

## 2018-10-11 ENCOUNTER — Other Ambulatory Visit: Payer: Self-pay

## 2018-10-11 DIAGNOSIS — F329 Major depressive disorder, single episode, unspecified: Secondary | ICD-10-CM

## 2018-10-11 HISTORY — PX: CHOLECYSTECTOMY: SHX55

## 2018-10-11 SURGERY — LAPAROSCOPIC CHOLECYSTECTOMY WITH INTRAOPERATIVE CHOLANGIOGRAM
Anesthesia: General | Site: Abdomen

## 2018-10-11 MED ORDER — ONDANSETRON HCL 4 MG/2ML IJ SOLN
INTRAMUSCULAR | Status: AC
  Filled 2018-10-11: qty 2

## 2018-10-11 MED ORDER — SODIUM CHLORIDE 0.9 % IR SOLN
Status: DC | PRN
  Administered 2018-10-11: 1000 mL

## 2018-10-11 MED ORDER — ONDANSETRON HCL 4 MG/2ML IJ SOLN: 4.0000 mg | Freq: Once | INTRAMUSCULAR | Status: DC | PRN

## 2018-10-11 MED ORDER — LACTATED RINGERS IV SOLN
INTRAVENOUS | Status: DC
  Administered 2018-10-11 (×2): via INTRAVENOUS

## 2018-10-11 MED ORDER — BUPIVACAINE-EPINEPHRINE 0.25% -1:200000 IJ SOLN
INTRAMUSCULAR | Status: DC | PRN
  Administered 2018-10-11: 17 mL

## 2018-10-11 MED ORDER — MORPHINE SULFATE (PF) 2 MG/ML IV SOLN: 1.0000 mg | INTRAVENOUS | Status: DC | PRN

## 2018-10-11 MED ORDER — CHLORHEXIDINE GLUCONATE CLOTH 2 % EX PADS
6.0000 | MEDICATED_PAD | Freq: Once | CUTANEOUS | Status: AC
  Administered 2018-10-11: 6 via TOPICAL

## 2018-10-11 MED ORDER — CEFAZOLIN SODIUM 1 G IJ SOLR
INTRAMUSCULAR | Status: AC
  Filled 2018-10-11: qty 40

## 2018-10-11 MED ORDER — CALCIUM CHLORIDE 10 % IV SOLN
INTRAVENOUS | Status: AC
  Filled 2018-10-11: qty 10

## 2018-10-11 MED ORDER — ROCURONIUM BROMIDE 10 MG/ML (PF) SYRINGE
PREFILLED_SYRINGE | INTRAVENOUS | Status: AC
  Filled 2018-10-11: qty 20

## 2018-10-11 MED ORDER — BUPIVACAINE-EPINEPHRINE (PF) 0.25% -1:200000 IJ SOLN
INTRAMUSCULAR | Status: AC
  Filled 2018-10-11: qty 30

## 2018-10-11 MED ORDER — ETOMIDATE 2 MG/ML IV SOLN
INTRAVENOUS | Status: AC
  Filled 2018-10-11: qty 10

## 2018-10-11 MED ORDER — DEXAMETHASONE SODIUM PHOSPHATE 10 MG/ML IJ SOLN
INTRAMUSCULAR | Status: DC | PRN
  Administered 2018-10-11: 10 mg via INTRAVENOUS

## 2018-10-11 MED ORDER — PHENYLEPHRINE 40 MCG/ML (10ML) SYRINGE FOR IV PUSH (FOR BLOOD PRESSURE SUPPORT)
PREFILLED_SYRINGE | INTRAVENOUS | Status: AC
  Filled 2018-10-11: qty 10

## 2018-10-11 MED ORDER — EPHEDRINE SULFATE-NACL 50-0.9 MG/10ML-% IV SOSY
PREFILLED_SYRINGE | INTRAVENOUS | Status: DC | PRN
  Administered 2018-10-11 (×3): 10 mg via INTRAVENOUS

## 2018-10-11 MED ORDER — 0.9 % SODIUM CHLORIDE (POUR BTL) OPTIME
TOPICAL | Status: DC | PRN
  Administered 2018-10-11: 11:00:00 1000 mL

## 2018-10-11 MED ORDER — CEFAZOLIN SODIUM-DEXTROSE 2-3 GM-%(50ML) IV SOLR
INTRAVENOUS | Status: DC | PRN
  Administered 2018-10-11: 2 g via INTRAVENOUS

## 2018-10-11 MED ORDER — ACETAMINOPHEN 500 MG PO TABS
1000.0000 mg | ORAL_TABLET | ORAL | Status: AC
  Administered 2018-10-11: 10:00:00 1000 mg via ORAL
  Filled 2018-10-11: qty 2

## 2018-10-11 MED ORDER — ACETAMINOPHEN 10 MG/ML IV SOLN: 1000.0000 mg | Freq: Once | INTRAVENOUS | Status: DC | PRN

## 2018-10-11 MED ORDER — FENTANYL CITRATE (PF) 250 MCG/5ML IJ SOLN
INTRAMUSCULAR | Status: AC
  Filled 2018-10-11: qty 5

## 2018-10-11 MED ORDER — SUCCINYLCHOLINE CHLORIDE 200 MG/10ML IV SOSY
PREFILLED_SYRINGE | INTRAVENOUS | Status: AC
  Filled 2018-10-11: qty 20

## 2018-10-11 MED ORDER — GABAPENTIN 300 MG PO CAPS
300.0000 mg | ORAL_CAPSULE | ORAL | Status: AC
  Administered 2018-10-11: 300 mg via ORAL

## 2018-10-11 MED ORDER — ROCURONIUM BROMIDE 10 MG/ML (PF) SYRINGE
PREFILLED_SYRINGE | INTRAVENOUS | Status: DC | PRN
  Administered 2018-10-11: 20 mg via INTRAVENOUS
  Administered 2018-10-11: 60 mg via INTRAVENOUS

## 2018-10-11 MED ORDER — LIDOCAINE 2% (20 MG/ML) 5 ML SYRINGE
INTRAMUSCULAR | Status: DC | PRN
  Administered 2018-10-11: 80 mg via INTRAVENOUS

## 2018-10-11 MED ORDER — FENTANYL CITRATE (PF) 250 MCG/5ML IJ SOLN
INTRAMUSCULAR | Status: DC | PRN
  Administered 2018-10-11: 50 ug via INTRAVENOUS
  Administered 2018-10-11: 100 ug via INTRAVENOUS
  Administered 2018-10-11 (×2): 50 ug via INTRAVENOUS
  Administered 2018-10-11: 100 ug via INTRAVENOUS
  Administered 2018-10-11: 50 ug via INTRAVENOUS

## 2018-10-11 MED ORDER — SUGAMMADEX SODIUM 200 MG/2ML IV SOLN
INTRAVENOUS | Status: DC | PRN
  Administered 2018-10-11: 400 mg via INTRAVENOUS

## 2018-10-11 MED ORDER — PROPOFOL 10 MG/ML IV BOLUS
INTRAVENOUS | Status: DC | PRN
  Administered 2018-10-11: 150 mg via INTRAVENOUS

## 2018-10-11 MED ORDER — ONDANSETRON HCL 4 MG/2ML IJ SOLN
INTRAMUSCULAR | Status: DC | PRN
  Administered 2018-10-11: 4 mg via INTRAVENOUS

## 2018-10-11 MED ORDER — DEXAMETHASONE SODIUM PHOSPHATE 10 MG/ML IJ SOLN
INTRAMUSCULAR | Status: AC
  Filled 2018-10-11: qty 1

## 2018-10-11 MED ORDER — SUCCINYLCHOLINE CHLORIDE 200 MG/10ML IV SOSY
PREFILLED_SYRINGE | INTRAVENOUS | Status: DC | PRN
  Administered 2018-10-11: 80 mg via INTRAVENOUS

## 2018-10-11 MED ORDER — LIDOCAINE 2% (20 MG/ML) 5 ML SYRINGE
INTRAMUSCULAR | Status: AC
  Filled 2018-10-11: qty 10

## 2018-10-11 MED ORDER — SODIUM CHLORIDE 0.9 % IV SOLN
INTRAVENOUS | Status: DC | PRN
  Administered 2018-10-11: 6 mL

## 2018-10-11 MED ORDER — FENTANYL CITRATE (PF) 100 MCG/2ML IJ SOLN: 25.0000 ug | INTRAMUSCULAR | Status: DC | PRN

## 2018-10-11 SURGICAL SUPPLY — 35 items
APPLIER CLIP 5 13 M/L LIGAMAX5 (MISCELLANEOUS) ×2
BLADE CLIPPER SURG (BLADE) IMPLANT
CANISTER SUCT 3000ML PPV (MISCELLANEOUS) ×2 IMPLANT
CATH REDDICK CHOLANGI 4FR 50CM (CATHETERS) ×2 IMPLANT
CHLORAPREP W/TINT 26ML (MISCELLANEOUS) ×2 IMPLANT
CLIP APPLIE 5 13 M/L LIGAMAX5 (MISCELLANEOUS) ×1 IMPLANT
COVER MAYO STAND STRL (DRAPES) ×2 IMPLANT
COVER SURGICAL LIGHT HANDLE (MISCELLANEOUS) ×2 IMPLANT
COVER WAND RF STERILE (DRAPES) ×1 IMPLANT
DERMABOND ADVANCED (GAUZE/BANDAGES/DRESSINGS) ×1
DERMABOND ADVANCED .7 DNX12 (GAUZE/BANDAGES/DRESSINGS) ×1 IMPLANT
DRAPE C-ARM 42X72 X-RAY (DRAPES) ×2 IMPLANT
ELECT REM PT RETURN 9FT ADLT (ELECTROSURGICAL) ×2
ELECTRODE REM PT RTRN 9FT ADLT (ELECTROSURGICAL) ×1 IMPLANT
GLOVE BIO SURGEON STRL SZ7.5 (GLOVE) ×2 IMPLANT
GOWN STRL REUS W/ TWL LRG LVL3 (GOWN DISPOSABLE) ×3 IMPLANT
GOWN STRL REUS W/TWL LRG LVL3 (GOWN DISPOSABLE) ×4
IV CATH 14GX2 1/4 (CATHETERS) ×2 IMPLANT
KIT BASIN OR (CUSTOM PROCEDURE TRAY) ×2 IMPLANT
KIT TURNOVER KIT B (KITS) ×2 IMPLANT
NS IRRIG 1000ML POUR BTL (IV SOLUTION) ×2 IMPLANT
PAD ARMBOARD 7.5X6 YLW CONV (MISCELLANEOUS) ×2 IMPLANT
POUCH SPECIMEN RETRIEVAL 10MM (ENDOMECHANICALS) ×2 IMPLANT
SCISSORS LAP 5X35 DISP (ENDOMECHANICALS) ×2 IMPLANT
SET IRRIG TUBING LAPAROSCOPIC (IRRIGATION / IRRIGATOR) ×2 IMPLANT
SET TUBE SMOKE EVAC HIGH FLOW (TUBING) ×2 IMPLANT
SLEEVE ENDOPATH XCEL 5M (ENDOMECHANICALS) ×4 IMPLANT
SPECIMEN JAR SMALL (MISCELLANEOUS) ×2 IMPLANT
SUT MNCRL AB 4-0 PS2 18 (SUTURE) ×2 IMPLANT
TOWEL OR 17X24 6PK STRL BLUE (TOWEL DISPOSABLE) ×2 IMPLANT
TOWEL OR 17X26 10 PK STRL BLUE (TOWEL DISPOSABLE) ×2 IMPLANT
TRAY LAPAROSCOPIC MC (CUSTOM PROCEDURE TRAY) ×2 IMPLANT
TROCAR XCEL BLUNT TIP 100MML (ENDOMECHANICALS) ×2 IMPLANT
TROCAR XCEL NON-BLD 5MMX100MML (ENDOMECHANICALS) ×2 IMPLANT
WATER STERILE IRR 1000ML POUR (IV SOLUTION) ×2 IMPLANT

## 2018-10-11 NOTE — Progress Notes (Signed)
Patient tolerated clear liquids well post op, advanced diet to soft per MD order

## 2018-10-11 NOTE — Transfer of Care (Signed)
Immediate Anesthesia Transfer of Care Note  Patient: [name]  Procedure(s) Performed: LAPAROSCOPIC CHOLECYSTECTOMY WITH INTRAOPERATIVE CHOLANGIOGRAM (N/A Abdomen)  Patient Location: PACU  Anesthesia Type:General  Level of Consciousness: drowsy and responds to stimulation  Airway & Oxygen Therapy: Patient Spontanous Breathing  Post-op Assessment: Report given to RN and Post -op Vital signs reviewed and stable  Post vital signs: Reviewed and stable  Last Vitals:  Vitals Value Taken Time  BP 134/61 10/11/2018 12:21 PM  Temp    Pulse 76 10/11/2018 12:22 PM  Resp 15 10/11/2018 12:22 PM  SpO2 92 % 10/11/2018 12:22 PM  Vitals shown include unvalidated device data.  Last Pain:  Vitals:   10/11/18 1032  TempSrc:   PainSc: 0-No pain      Patients Stated Pain Goal: 2 (40/76/80 8811)  Complications: No apparent anesthesia complications

## 2018-10-11 NOTE — Progress Notes (Signed)
PROGRESS NOTE    [name]  WPY:[id number] DOB: 1928-03-20 DOA: 10/09/2018 PCP: Lajean Manes, MD   Brief Narrative:  Per HPI: Jeannette How a 85 y.o.malewith medical history significant ofHTN, BPH, GERD, rheumatoid arthritis,dCHF(EF 60-65% 04/25/18), aortic stenosis s/p TAVR 03/27/2018,hyperlipidemia, depression, Gilbert syndrome, hard of hearing, CKD-III,who presents with abdominal pain.  Pt has HOH and is a poor historian. I called family and spoke with herdaughter and granddaughter to havecollected more information from them. Patient was recently hospitalized from 4/11-4/15 due to choledocholithiasis. He hadMRCP revealedcholedocholithiasis. GIperformed ERCP 4/14 with complete removalof stones withbiliary sphincterotomy and balloon extraction.General surgeon was consulted, recommendedlap chole, however the patient'sfamilydid not want him to have surgery due to [place] restrictions and the language barrier. Per family,patient developed abdominal pain again formore than 24 hours, which is located right upper quadrant, moderate, sharp, nonradiating. Patient has nausea, and dry heaves no vomiting or diarrhea. Patient does not have fever or chills. He does not have chest pain, shortness of breath, cough. No symptoms of UTI or unilateral weakness.  ED Course:pt was found to have WBC 8.6, lactic acid 1.0, lipase 39,normal LFT,negative urinalysis, stable renal function, temperature normal, heart rate 55, oxygen saturation 93-98% on room air. Patient is admitted to telemetry bed as inpatient. General surgeon, Dr. Georgette Dover was consulted.   Assessment & Plan:   Principal Problem:   Abdominal pain Active Problems:   Benign essential HTN   S/P TAVR (transcatheter aortic valve replacement)   GERD (gastroesophageal reflux disease)   Benign prostatic hyperplasia   Depression   Chronic diastolic CHF (congestive heart failure) (HCC)   CKD (chronic kidney disease),  stage III (HCC)   Abdominal pain/symptomatic cholelithiasis.  As previously noted, this is likely due to symptomatic cholelithiasis with sludge is identified on the right upper quadrant ultrasound.  General surgery saw the patient in consultation plan for surgery today,  Case discussed with patient's daughter by GS who is in agreement with plan of care.  Continue with analgesics, antiemetics, n.p.o. status  Hypertension no changes, continue patient's home medications include Cozaar.  IV hydralazine PRN with parameters  Chronic diastolic CHF not in acute exacerbation: Patient is n.p.o. holding Lasix will monitor volume status closely, follow BMPs daily.  Status post TAVR.  No acute issues patient was seen by cardiology aortic valve stable.  CKD stage III.  Avoid nephrotoxic agents, follow BMPs as above.  DVT prophylaxis: SCD/Compression stockings  Code Status: DNR    Code Status Orders  (From admission, onward)         Start     Ordered   10/09/18 2006  Do not attempt resuscitation (DNR)  Continuous    Question Answer Comment  In the event of cardiac or respiratory ARREST Do not call a code blue   In the event of cardiac or respiratory ARREST Do not perform Intubation, CPR, defibrillation or ACLS   In the event of cardiac or respiratory ARREST Use medication by any route, position, wound care, and other measures to relive pain and suffering. May use oxygen, suction and manual treatment of airway obstruction as needed for comfort.      10/09/18 2005        Code Status History    Date Active Date Inactive Code Status Order ID Comments User Context   09/22/2018 1544 09/26/2018 1905 DNR [id number]  Doreatha Lew, MD ED   06/04/2018 2340 06/05/2018 1542 Full Code [id number]  Meade Maw, MD Inpatient   03/27/2018 1206 03/28/2018 1518  Full Code [id number]  Crista Luria Inpatient   02/04/2018 1537 02/07/2018 2109 Full Code [id number]  Sid Falcon, MD  Inpatient   08/28/2016 1218 09/06/2016 1711 Full Code [id number]  Elizabeth Sauer Inpatient   08/28/2016 1218 08/28/2016 1218 Full Code [id number]  Elizabeth Sauer Inpatient   08/13/2016 2032 08/28/2016 1216 Full Code [id number]  Rolm Bookbinder, MD Inpatient   05/05/2015 1142 05/06/2015 1303 Full Code [id number]  Armandina Gemma, MD Inpatient     Family Communication: Discussed with daughter tomorrow Disposition Plan:   Patient remained inpatient for postoperative recovery status post laparoscopic cholecystectomy with intraoperative cholangiogram.  Will advance diet slowly continue with fluid resuscitation as needed likely discharge tomorrow Consults called: None Admission status: Inpatient   Consultants:   General surgery  Procedures:  Dg Cholangiogram Operative  Result Date: 10/11/2018 CLINICAL DATA:  Cholelithiasis EXAM: INTRAOPERATIVE CHOLANGIOGRAM TECHNIQUE: Cholangiographic images from the C-arm fluoroscopic device were submitted for interpretation post-operatively. Please see the procedural report for the amount of contrast and the fluoroscopy time utilized. COMPARISON:  None. FINDINGS: Contrast fills the biliary tree and duodenum without filling defects in the common bile duct. IMPRESSION: Patent biliary tree. Electronically Signed   By: Marybelle Killings M.D.   On: 10/11/2018 13:48   Mr 3d Recon At Scanner  Result Date: 09/24/2018 CLINICAL DATA:  85 year old male with history of common bile duct stone and biliary obstruction. EXAM: MRI ABDOMEN WITHOUT AND WITH CONTRAST (INCLUDING MRCP) TECHNIQUE: Multiplanar multisequence MR imaging of the abdomen was performed both before and after the administration of intravenous contrast. Heavily T2-weighted images of the biliary and pancreatic ducts were obtained, and three-dimensional MRCP images were rendered by post processing. CONTRAST:  8 mL of Gadavist. COMPARISON:  No priors. Abdominal ultrasound 09/22/2018. CT the abdomen and pelvis  02/07/2018. FINDINGS: Comment: Today's study is slightly limited by patient respiratory motion. Lower chest: Unremarkable. Hepatobiliary: No suspicious cystic or solid hepatic lesions. No intrahepatic biliary ductal dilatation. Common bile duct is borderline dilated measuring 7 mm in the porta hepatis (within normal limits for the patient's age). Several small filling defects are noted in the common bile duct, largest of which measures up to 5 mm distally shortly before the ampulla. In addition, amorphous filling defects are noted in the gallbladder, indicative of a combination of biliary sludge and small gallstones. Gallbladder is moderately distended, with some mild gallbladder wall thickening (6 mm) and increased T2 signal intensity in the wall, indicative of edema. Trace volume of T2 signal intensity adjacent to the gallbladder, indicative of pericholecystic fluid. This is contiguous with increased T2 signal intensity adjacent to the descending portion of the duodenum. Pancreas: No pancreatic mass. Small amount of increased T2 signal intensity adjacent to the head of the pancreas and the adjacent descending duodenum. No pancreatic ductal dilatation noted on MRCP images. No peripancreatic fluid collections. Spleen:  Unremarkable. Adrenals/Urinary Tract: Multiple T1 hypointense, T2 hyperintense, nonenhancing lesions in both kidneys, compatible with simple cysts, largest of which is exophytic in the anterior aspect of the interpolar region of the right kidney measuring 3.6 cm in diameter. No definite suspicious renal lesions. No hydroureteronephrosis in the visualized portions of the abdomen. Bilateral adrenal glands are normal in appearance. Stomach/Bowel: Normal appearance of the stomach. Increased T2 signal intensity surrounding the descending portion of the duodenum. Remaining portions of visualized small bowel and colon are unremarkable. Vascular/Lymphatic: Aortic atherosclerosis, without evidence of aneurysm  in the abdominal vasculature. No lymphadenopathy noted in the abdomen. Other:  No significant volume of ascites in the visualized portions of the peritoneal cavity. Musculoskeletal: No aggressive appearing osseous lesions are noted in the visualized portions of the skeleton. IMPRESSION: 1. Study is positive for biliary sludge and cholelithiasis as well as choledocholithiasis. At this time, there is no evidence of frank biliary tract obstruction despite the presence of common bile duct stones measuring up to 5 mm in the periampullary region. Notably, the gallbladder is moderately distended with some mild gallbladder wall thickening and edema, as well as trace amount of pericholecystic fluid. There is also some fluid adjacent to the head of the pancreas and descending portion of the duodenum, which could indicate an associated pancreatitis. 2. Aortic atherosclerosis. Electronically Signed   By: Vinnie Langton M.D.   On: 09/24/2018 11:50   Dg Chest Port 1 View  Result Date: 09/22/2018 CLINICAL DATA:  Acute onset of mid chest pain that began last night. Current history of CHF and hypertension. Prior [place]. EXAM: PORTABLE CHEST 1 VIEW COMPARISON:  CTA chest 06/04/2018. Chest x-rays 06/04/2018 and earlier. FINDINGS: Cardiac silhouette normal in size, unchanged. Prior CABG. Thoracic aorta mildly atherosclerotic and tortuous. Hilar and mediastinal contours otherwise unremarkable. Mildly prominent bronchovascular markings diffusely and mild central peribronchial thickening, unchanged. Lungs otherwise clear. No localized airspace consolidation. No pleural effusions. No pneumothorax. Normal pulmonary vascularity. IMPRESSION: Stable mild changes of chronic bronchitis and/or asthma. No acute cardiopulmonary disease. Electronically Signed   By: Evangeline Dakin M.D.   On: 09/22/2018 11:25   Dg Ercp Biliary & Pancreatic Ducts  Result Date: 09/25/2018 CLINICAL DATA:  Common bile duct calculus EXAM: ERCP TECHNIQUE:  Multiple spot images obtained with the fluoroscopic device and submitted for interpretation post-procedure. FLUOROSCOPY TIME:  Fluoroscopy Time:  3 minutes and 17 seconds Radiation Exposure Index (if provided by the fluoroscopic device): Number of Acquired Spot Images: 0 COMPARISON:  None. FINDINGS: Images demonstrate cannulation of the common bile duct followed by balloon stone retrieval. IMPRESSION: See above. These images were submitted for radiologic interpretation only. Please see the procedural report for the amount of contrast and the fluoroscopy time utilized. Electronically Signed   By: Marybelle Killings M.D.   On: 09/25/2018 14:41   Mr Abdomen Mrcp Moise Boring Contast  Result Date: 09/24/2018 CLINICAL DATA:  85 year old male with history of common bile duct stone and biliary obstruction. EXAM: MRI ABDOMEN WITHOUT AND WITH CONTRAST (INCLUDING MRCP) TECHNIQUE: Multiplanar multisequence MR imaging of the abdomen was performed both before and after the administration of intravenous contrast. Heavily T2-weighted images of the biliary and pancreatic ducts were obtained, and three-dimensional MRCP images were rendered by post processing. CONTRAST:  8 mL of Gadavist. COMPARISON:  No priors. Abdominal ultrasound 09/22/2018. CT the abdomen and pelvis 02/07/2018. FINDINGS: Comment: Today's study is slightly limited by patient respiratory motion. Lower chest: Unremarkable. Hepatobiliary: No suspicious cystic or solid hepatic lesions. No intrahepatic biliary ductal dilatation. Common bile duct is borderline dilated measuring 7 mm in the porta hepatis (within normal limits for the patient's age). Several small filling defects are noted in the common bile duct, largest of which measures up to 5 mm distally shortly before the ampulla. In addition, amorphous filling defects are noted in the gallbladder, indicative of a combination of biliary sludge and small gallstones. Gallbladder is moderately distended, with some mild  gallbladder wall thickening (6 mm) and increased T2 signal intensity in the wall, indicative of edema. Trace volume of T2 signal intensity adjacent to the gallbladder, indicative of pericholecystic fluid. This is contiguous  with increased T2 signal intensity adjacent to the descending portion of the duodenum. Pancreas: No pancreatic mass. Small amount of increased T2 signal intensity adjacent to the head of the pancreas and the adjacent descending duodenum. No pancreatic ductal dilatation noted on MRCP images. No peripancreatic fluid collections. Spleen:  Unremarkable. Adrenals/Urinary Tract: Multiple T1 hypointense, T2 hyperintense, nonenhancing lesions in both kidneys, compatible with simple cysts, largest of which is exophytic in the anterior aspect of the interpolar region of the right kidney measuring 3.6 cm in diameter. No definite suspicious renal lesions. No hydroureteronephrosis in the visualized portions of the abdomen. Bilateral adrenal glands are normal in appearance. Stomach/Bowel: Normal appearance of the stomach. Increased T2 signal intensity surrounding the descending portion of the duodenum. Remaining portions of visualized small bowel and colon are unremarkable. Vascular/Lymphatic: Aortic atherosclerosis, without evidence of aneurysm in the abdominal vasculature. No lymphadenopathy noted in the abdomen. Other: No significant volume of ascites in the visualized portions of the peritoneal cavity. Musculoskeletal: No aggressive appearing osseous lesions are noted in the visualized portions of the skeleton. IMPRESSION: 1. Study is positive for biliary sludge and cholelithiasis as well as choledocholithiasis. At this time, there is no evidence of frank biliary tract obstruction despite the presence of common bile duct stones measuring up to 5 mm in the periampullary region. Notably, the gallbladder is moderately distended with some mild gallbladder wall thickening and edema, as well as trace amount of  pericholecystic fluid. There is also some fluid adjacent to the head of the pancreas and descending portion of the duodenum, which could indicate an associated pancreatitis. 2. Aortic atherosclerosis. Electronically Signed   By: Vinnie Langton M.D.   On: 09/24/2018 11:50   US Abdomen Limited Ruq  Result Date: 10/09/2018 CLINICAL DATA:  Initial evaluation for acute right upper quadrant pain. EXAM: ULTRASOUND ABDOMEN LIMITED RIGHT UPPER QUADRANT COMPARISON:  Prior MRI from 09/24/2018 FINDINGS: Gallbladder: Nonshadowing layering echogenic material within the gallbladder lumen most consistent with sludge. No frank shadowing cholelithiasis. Gallbladder wall measures within normal limits of 1.8 mm. No free pericholecystic fluid. No sonographic Murphy sign elicited on exam. Common bile duct: Diameter: 5.5 mm.  No choledocholithiasis. Liver: No focal lesion identified. Within normal limits in parenchymal echogenicity. Portal vein is patent on color Doppler imaging with normal direction of blood flow towards the liver. Incidental note made of 2 simple right renal cysts measuring 1.6 x 2.1 x 1.9 cm and 3.7 x 3.5 x 2.9 cm. IMPRESSION: 1. Gallbladder sludge without frank cholelithiasis. No sonographic features to suggest acute cholecystitis. 2. No biliary dilatation or choledocholithiasis by ultrasound. 3. Incidental simple right renal cysts as above. Electronically Signed   By: Jeannine Boga M.D.   On: 10/09/2018 18:42   US Abdomen Limited Ruq  Result Date: 09/22/2018 CLINICAL DATA:  Elevated liver function test. EXAM: ULTRASOUND ABDOMEN LIMITED RIGHT UPPER QUADRANT COMPARISON:  None. FINDINGS: Gallbladder: Mixed echogenicity material within the gallbladder, presumably sludge. Hyperechoic masslike focus within the gallbladder measures 1.3 x 1.2 cm, most likely tumefactive sludge. No gallbladder wall thickening or pericholecystic fluid. No sonographic Murphy's sign elicited during the exam. Common bile duct:  Diameter: 5 mm Liver: Liver echotexture is coarse and there is nodularity of the peripheral liver margins suggesting cirrhosis. No focal mass or lesion within the liver. Portal vein is patent on color Doppler imaging with normal direction of blood flow towards the liver. Incidental note of a RIGHT renal cyst, measuring 3.6 cm, corresponding to a benign cyst demonstrated on CT dated  02/07/2018. IMPRESSION: 1. No evidence of cholecystitis. No discrete gallstones identified. Hyperechoic masslike structure within the gallbladder measures 1.3 cm, most likely tumefactive sludge. Additional layering sludge within the gallbladder. 2. No bile duct dilatation. 3. Probable liver cirrhosis. Electronically Signed   By: Franki Cabot M.D.   On: 09/22/2018 13:22     Antimicrobials:   Perioperative per surgery   Subjective: No acute events.  Patient resting comfortably.  Abdominal pain stable anticipated surgery today.  Objective: Vitals:   10/11/18 1415 10/11/18 1420 10/11/18 1424 10/11/18 1447  BP:   (!) 131/53 (!) 130/57  Pulse: 70  66 66  Resp: 13  12 16   Temp:  (!) 97.3 F (36.3 C)  98.1 F (36.7 C)  TempSrc:    Oral  SpO2: 96%  96% 97%  Weight:      Height:        Intake/Output Summary (Last 24 hours) at 10/11/2018 1459 Last data filed at 10/11/2018 1429 Gross per 24 hour  Intake 1840 ml  Output 230 ml  Net 1610 ml   Filed Weights   10/09/18 2225 10/11/18 0300 10/11/18 1032  Weight: 72.8 kg 72.8 kg 72.8 kg    Examination:  General exam: Appears calm and comfortable  Respiratory system: Clear to auscultation. Respiratory effort normal. Cardiovascular system: S1 & S2 heard, RRR. No JVD, murmurs, rubs, gallops or clicks. No pedal edema. Gastrointestinal system: Abdomen is nondistended, soft and MILDLY TTP RUQ. No organomegaly or masses felt. Quiet bowel sounds heard. Central nervous system: Alert and oriented. No focal neurological deficits. Extremities: Symmetric 5 x 5 power. Skin:  No rashes, lesions or ulcers Psychiatry: Judgement and insight appear normal. Mood & affect appropriate.     Data Reviewed: I have personally reviewed following labs and imaging studies  CBC: Recent Labs  Lab 10/09/18 1736 10/10/18 0233  WBC 8.6 8.1  HGB 12.4* 11.6*  HCT 39.0 35.8*  MCV 91.5 89.5  PLT 207 737   Basic Metabolic Panel: Recent Labs  Lab 10/09/18 1736 10/10/18 0233  NA 136 135  K 4.8 4.0  CL 100 103  CO2 27 25  GLUCOSE 150* 133*  BUN 14 11  CREATININE 1.20 1.01  CALCIUM 9.2 8.8*   GFR: Estimated Creatinine Clearance: 44.3 mL/min (by C-G formula based on SCr of 1.01 mg/dL). Liver Function Tests: Recent Labs  Lab 10/09/18 1736  AST 21  ALT 20  ALKPHOS 88  BILITOT 1.1  PROT 6.9  ALBUMIN 3.6   Recent Labs  Lab 10/09/18 1736  LIPASE 39   No results for input(s): AMMONIA in the last 168 hours. Coagulation Profile: Recent Labs  Lab 10/09/18 2017  INR 1.1   Cardiac Enzymes: No results for input(s): CKTOTAL, CKMB, CKMBINDEX, TROPONINI in the last 168 hours. BNP (last 3 results) No results for input(s): PROBNP in the last 8760 hours. HbA1C: No results for input(s): HGBA1C in the last 72 hours. CBG: No results for input(s): GLUCAP in the last 168 hours. Lipid Profile: No results for input(s): CHOL, HDL, LDLCALC, TRIG, CHOLHDL, LDLDIRECT in the last 72 hours. Thyroid Function Tests: No results for input(s): TSH, T4TOTAL, FREET4, T3FREE, THYROIDAB in the last 72 hours. Anemia Panel: No results for input(s): VITAMINB12, FOLATE, FERRITIN, TIBC, IRON, RETICCTPCT in the last 72 hours. Sepsis Labs: Recent Labs  Lab 10/09/18 1736  LATICACIDVEN 1.0    Recent Results (from the past 240 hour(s))  MRSA PCR Screening     Status: None   Collection Time: 10/10/18  5:51 PM  Result Value Ref Range Status   MRSA by PCR NEGATIVE NEGATIVE Final    Comment:        The GeneXpert MRSA Assay (FDA approved for NASAL specimens only), is one component of  a comprehensive MRSA colonization surveillance program. It is not intended to diagnose MRSA infection nor to guide or monitor treatment for MRSA infections. Performed at [facility], [place] [address] [place], [place] [address]          Radiology Studies: Dg Cholangiogram Operative  Result Date: 10/11/2018 CLINICAL DATA:  Cholelithiasis EXAM: INTRAOPERATIVE CHOLANGIOGRAM TECHNIQUE: Cholangiographic images from the C-arm fluoroscopic device were submitted for interpretation post-operatively. Please see the procedural report for the amount of contrast and the fluoroscopy time utilized. COMPARISON:  None. FINDINGS: Contrast fills the biliary tree and duodenum without filling defects in the common bile duct. IMPRESSION: Patent biliary tree. Electronically Signed   By: Marybelle Killings M.D.   On: 10/11/2018 13:48   US Abdomen Limited Ruq  Result Date: 10/09/2018 CLINICAL DATA:  Initial evaluation for acute right upper quadrant pain. EXAM: ULTRASOUND ABDOMEN LIMITED RIGHT UPPER QUADRANT COMPARISON:  Prior MRI from 09/24/2018 FINDINGS: Gallbladder: Nonshadowing layering echogenic material within the gallbladder lumen most consistent with sludge. No frank shadowing cholelithiasis. Gallbladder wall measures within normal limits of 1.8 mm. No free pericholecystic fluid. No sonographic Murphy sign elicited on exam. Common bile duct: Diameter: 5.5 mm.  No choledocholithiasis. Liver: No focal lesion identified. Within normal limits in parenchymal echogenicity. Portal vein is patent on color Doppler imaging with normal direction of blood flow towards the liver. Incidental note made of 2 simple right renal cysts measuring 1.6 x 2.1 x 1.9 cm and 3.7 x 3.5 x 2.9 cm. IMPRESSION: 1. Gallbladder sludge without frank cholelithiasis. No sonographic features to suggest acute cholecystitis. 2. No biliary dilatation or choledocholithiasis by ultrasound. 3. Incidental simple right renal cysts as above. Electronically  Signed   By: Jeannine Boga M.D.   On: 10/09/2018 18:42        Scheduled Meds:  aspirin EC  81 mg Oral Daily   cholecalciferol  1,000 Units Oral Daily   dutasteride  0.5 mg Oral Daily   escitalopram  20 mg Oral QHS   folic acid  1 mg Oral QPM   losartan  25 mg Oral Daily   omega-3 acid ethyl esters  1 g Oral Daily   vitamin C  1,000 mg Oral QPC lunch   vitamin E  400 Units Oral QPC lunch   Continuous Infusions:  lactated ringers 10 mL/hr at 10/11/18 1028     LOS: 2 days    Time spent: 35 min    Nicolette Bang, MD [facility]  If 7PM-7AM, please contact night-coverage  10/11/2018, 2:59 PM

## 2018-10-11 NOTE — Anesthesia Procedure Notes (Signed)
Procedure Name: Intubation Date/Time: 10/11/2018 10:55 AM Performed by: Elayne Snare, CRNA Pre-anesthesia Checklist: Patient identified, Emergency Drugs available, Suction available and Patient being monitored Patient Re-evaluated:Patient Re-evaluated prior to induction Oxygen Delivery Method: Circle System Utilized Preoxygenation: Pre-oxygenation with 100% oxygen Induction Type: IV induction and Rapid sequence Laryngoscope Size: Mac and 4 Grade View: Grade I Tube type: Oral Tube size: 7.5 mm Number of attempts: 1 Airway Equipment and Method: Stylet Placement Confirmation: ETT inserted through vocal cords under direct vision,  positive ETCO2 and breath sounds checked- equal and bilateral Secured at: 22 cm Tube secured with: Tape Dental Injury: Teeth and Oropharynx as per pre-operative assessment

## 2018-10-11 NOTE — Anesthesia Postprocedure Evaluation (Signed)
Anesthesia Post Note  Patient: [name]  Procedure(s) Performed: LAPAROSCOPIC CHOLECYSTECTOMY WITH INTRAOPERATIVE CHOLANGIOGRAM (N/A Abdomen)     Patient location during evaluation: PACU Anesthesia Type: General Level of consciousness: awake and alert Pain management: pain level controlled Vital Signs Assessment: post-procedure vital signs reviewed and stable Respiratory status: spontaneous breathing, nonlabored ventilation, respiratory function stable and patient connected to nasal cannula oxygen Cardiovascular status: blood pressure returned to baseline and stable Postop Assessment: no apparent nausea or vomiting Anesthetic complications: no    Last Vitals:  Vitals:   10/11/18 1300 10/11/18 1420  BP:    Pulse: 69   Resp: 11   Temp:  (!) 36.3 C  SpO2: 99%     Last Pain:  Vitals:   10/11/18 1300  TempSrc:   PainSc: [place]

## 2018-10-11 NOTE — Op Note (Signed)
10/09/2018 - 10/11/2018  12:01 PM  PATIENT:  [name]  85 y.o. male  PRE-OPERATIVE DIAGNOSIS:  BILARY COLIC  POST-OPERATIVE DIAGNOSIS:  BILARY COLIC, CHOLECYSTITIS, UMBILICAL HERNIA  PROCEDURE:  Procedure(s): LAPAROSCOPIC CHOLECYSTECTOMY WITH INTRAOPERATIVE CHOLANGIOGRAM (N/A)  UMBILICAL HERNIA REPAIR  SURGEON:  Surgeon(s) and Role:    * Jovita Kussmaul, MD - Primary    * Armandina Gemma, MD - Assisting  PHYSICIAN ASSISTANT:   ASSISTANTS: Dr. Harlow Asa   ANESTHESIA:   local and general  EBL:  minimal   BLOOD ADMINISTERED:none  DRAINS: none   LOCAL MEDICATIONS USED:  MARCAINE     SPECIMEN:  Source of Specimen:  gallbladder  DISPOSITION OF SPECIMEN:  PATHOLOGY  COUNTS:  YES  TOURNIQUET:  * No tourniquets in log *  DICTATION: .Dragon Dictation     Procedure: After informed consent was obtained the patient was brought to the operating room and placed in the supine position on the operating room table. After adequate induction of general anesthesia the patient's abdomen was prepped with ChloraPrep allowed to dry and draped in usual sterile manner. An appropriate timeout was performed. The area below the umbilicus was infiltrated with quarter percent  Marcaine. A small incision was made with a 15 blade knife. The incision was carried down through the subcutaneous tissue bluntly with a hemostat and [employer] retractors. The linea alba was identified. The linea alba was incised with a 15 blade knife and each side was grasped with Coker clamps. The preperitoneal space was then probed with a hemostat until the peritoneum was opened and access was gained to the abdominal cavity. A 0 Vicryl pursestring stitch was placed in the fascia surrounding the opening. A Hassan cannula was then placed through the opening and anchored in place with the previously placed Vicryl purse string stitch. The abdomen was insufflated with carbon dioxide without difficulty. A laparoscope was inserted through  the [facility] cannula in the right upper quadrant was inspected. Next the epigastric region was infiltrated with % Marcaine. A small incision was made with a 15 blade knife. A 5 mm port was placed bluntly through this incision into the abdominal cavity under direct vision. Next 2 sites were chosen laterally on the right side of the abdomen for placement of 5 mm ports. Each of these areas was infiltrated with quarter percent Marcaine. Small stab incisions were made with a 15 blade knife. 5 mm ports were then placed bluntly through these incisions into the abdominal cavity under direct vision without difficulty. The gallbladder was very inflamed and had to be aspirated in order to grasp it. A blunt grasper was placed through the lateralmost 5 mm port and used to grasp the dome of the gallbladder and elevated anteriorly and superiorly. Another blunt grasper was placed through the other 5 mm port and used to retract the body and neck of the gallbladder. A dissector was placed through the epigastric port and using the electrocautery the peritoneal reflection at the gallbladder neck was opened. Blunt dissection was then carried out in this area until the gallbladder neck-cystic duct junction was readily identified and a good window was created. A single clip was placed on the gallbladder neck. A small  ductotomy was made just below the clip with laparoscopic scissors. A 14-gauge Angiocath was then placed through the anterior abdominal wall under direct vision. A Reddick cholangiogram catheter was then placed through the Angiocath and flushed. The catheter was then placed in the cystic duct and anchored in place with a clip.  A cholangiogram was obtained that showed no filling defects good emptying into the duodenum an adequate length on the cystic duct. The anchoring clip and catheters were then removed from the patient. 2 clips were placed proximally on the cystic duct and the duct was divided between the 2 sets of clips.  Posterior to this the cystic artery was identified and again dissected bluntly in a circumferential manner until a good window  was created. 2 clips were placed proximally and one distally on the artery and the artery was divided between the 2 sets of clips. Next a laparoscopic hook cautery device was used to separate the gallbladder from the liver bed. Prior to completely detaching the gallbladder from the liver bed the liver bed was inspected and several small bleeding points were coagulated with the electrocautery until the area was completely hemostatic. The gallbladder was then detached the rest of it from the liver bed without difficulty. A laparoscopic bag was inserted through the hassan port. The laparoscope was moved to the epigastric port. The gallbladder was placed within the bag and the bag was sealed.  The bag with the gallbladder was then removed with the [facility] cannula through the infraumbilical port without difficulty. The fascial defect including the umbilical hernia was then closed with the previously placed Vicryl pursestring stitch as well as with 2 other figure-of-eight 0 Vicryl stitch. The liver bed was inspected again and found to be hemostatic. The abdomen was irrigated with copious amounts of saline until the effluent was clear. The ports were then removed under direct vision without difficulty and were found to be hemostatic. The gas was allowed to escape. The skin incisions were all closed with interrupted 4-0 Monocryl subcuticular stitches. Dermabond dressings were applied. The patient tolerated the procedure well. At the end of the case all needle sponge and instrument counts were correct. The patient was then awakened and taken to recovery in stable condition. The assistant was instumental for retraction and visualization during the case.  PLAN OF CARE: Admit for overnight observation  PATIENT DISPOSITION:  PACU - hemodynamically stable.   Delay start of Pharmacological VTE agent  (>24hrs) due to surgical blood loss or risk of bleeding: no

## 2018-10-11 NOTE — Interval H&P Note (Signed)
History and Physical Interval Note:  10/11/2018 10:11 AM  [name]  has presented today for surgery, with the diagnosis of BILARY COLIC.  The various methods of treatment have been discussed with the patient and family. After consideration of risks, benefits and other options for treatment, the patient has consented to  Procedure(s): LAPAROSCOPIC CHOLECYSTECTOMY WITH INTRAOPERATIVE CHOLANGIOGRAM (N/A) as a surgical intervention.  The patient's history has been reviewed, patient examined, no change in status, stable for surgery.  I have reviewed the patient's chart and labs.  Questions were answered to the patient's satisfaction.     Autumn Messing III

## 2018-10-12 ENCOUNTER — Encounter (HOSPITAL_COMMUNITY): Payer: Self-pay | Admitting: General Surgery

## 2018-10-12 MED ORDER — OXYCODONE HCL 5 MG PO TABS: 5.0000 mg | ORAL_TABLET | Freq: Four times a day (QID) | ORAL | 0 refills | Status: AC | PRN

## 2018-10-12 MED ORDER — ACETAMINOPHEN 325 MG PO TABS
650.0000 mg | ORAL_TABLET | Freq: Four times a day (QID) | ORAL | Status: DC
  Administered 2018-10-12 (×2): 650 mg via ORAL
  Filled 2018-10-12 (×2): qty 2

## 2018-10-12 MED ORDER — OXYCODONE HCL 5 MG PO TABS
5.0000 mg | ORAL_TABLET | Freq: Four times a day (QID) | ORAL | Status: DC | PRN
  Administered 2018-10-12: 5 mg via ORAL
  Filled 2018-10-12: qty 1

## 2018-10-12 MED ORDER — MORPHINE SULFATE (PF) 2 MG/ML IV SOLN: 2.0000 mg | INTRAVENOUS | Status: DC | PRN

## 2018-10-12 NOTE — Evaluation (Signed)
Physical Therapy Evaluation Patient Details Name: [name] MRN: [id number] DOB: 11-23-1927 Today's Date: 10/12/2018   History of Present Illness  Patient is an 85 y/o male presenting to the ED on 10/09/2018 with primary complaints of abdominal pain. Of note, recent hospitalization 4/11-4/15 due to choledocholithiasis. GI performed ERCP 4/14. Now s/p LAPAROSCOPIC CHOLECYSTECTOMY WITH INTRAOPERATIVE CHOLANGIOGRAM (N/A)  UMBILICAL HERNIA REPAIR on 10/11/2018. PMH significant of HTN, BPH, GERD, rheumatoid arthritis, dCHF (EF 60-65% 04/25/18), aortic stenosis s/p TAVR 03/27/2018, hyperlipidemia, depression, Gilbert syndrome, hard of hearing, CKD    Clinical Impression  Patient admitted with the above listed diagnosis. Patient reports Mod I prior to admission with daughter available to assist when needed. Patient today cautious with all mobility in an attempt to prevent pain (premedicated prior to session). Patient performing transfers and mobility without AD with supervision to min guard for safety. Will recommend HHPT at discharge to ensure safe and independent functional mobility within the home environment. PT to continue to follow acutely.     Follow Up Recommendations Home health PT;Supervision/Assistance - 24 hour    Equipment Recommendations  None recommended by PT    Recommendations for Other Services       Precautions / Restrictions Precautions Precautions: Fall Restrictions Weight Bearing Restrictions: No      Mobility  Bed Mobility Overal bed mobility: Modified Independent             General bed mobility comments: increased time and effort to prevent pain  Transfers Overall transfer level: Needs assistance Equipment used: 1 person hand held assist Transfers: Sit to/from Stand Sit to Stand: Min guard         General transfer comment: min guard to stand from bedside  Ambulation/Gait Ambulation/Gait assistance: Min guard;Supervision Gait Distance (Feet): 100  Feet Assistive device: 1 person hand held assist;None Gait Pattern/deviations: Step-to pattern;Step-through pattern;Decreased stride length Gait velocity: decreased   General Gait Details: short step length - likely due to patient attempting to prevent pain; initally with 1 HHA - patient able to progress to no assist with supervision for safety  Stairs            Wheelchair Mobility    Modified Rankin (Stroke Patients Only)       Balance Overall balance assessment: Mild deficits observed, not formally tested                                           Pertinent Vitals/Pain Pain Assessment: Faces Faces Pain Scale: Hurts even more Pain Location: R lateral trunk with deep breaths Pain Descriptors / Indicators: Aching;Discomfort;Grimacing;Guarding Pain Intervention(s): Limited activity within patient's tolerance;Monitored during session;Repositioned;Premedicated before session    Home Living Family/patient expects to be discharged to:: Private residence Living Arrangements: Alone Available Help at Discharge: Family;Available PRN/intermittently;Available 24 hours/day Type of Home: House Home Access: Stairs to enter Entrance Stairs-Rails: None Entrance Stairs-Number of Steps: 3 Home Layout: One level Home Equipment: Grab bars - tub/shower;Walker - 2 wheels;Cane - single point;Adaptive equipment;Shower seat      Prior Function Level of Independence: Independent         Comments: per daughter, has an aide, but due to current COVID concerns daughter has been assisting patient as needed     Hand Dominance   Dominant Hand: Right    Extremity/Trunk Assessment        Lower Extremity Assessment Lower Extremity Assessment: Generalized weakness  Cervical / Trunk Assessment Cervical / Trunk Assessment: Normal  Communication   Communication: Prefers language other than English  Cognition Arousal/Alertness: Awake/alert Behavior During Therapy:  WFL for tasks assessed/performed Overall Cognitive Status: Within Functional Limits for tasks assessed                                        General Comments General comments (skin integrity, edema, etc.): offered interpreter - declined and wished to call daughter     Exercises     Assessment/Plan    PT Assessment Patient needs continued PT services  PT Problem List Decreased strength;Decreased activity tolerance;Decreased balance;Decreased mobility;Decreased safety awareness       PT Treatment Interventions DME instruction;Gait training;Stair training;Functional mobility training;Therapeutic activities;Therapeutic exercise;Balance training;Patient/family education    PT Goals (Current goals can be found in the Care Plan section)  Acute Rehab PT Goals Patient Stated Goal: reduce pain PT Goal Formulation: With patient Time For Goal Achievement: 10/26/18 Potential to Achieve Goals: Good    Frequency Min 3X/week   Barriers to discharge        Co-evaluation               AM-PAC PT "6 Clicks" Mobility  Outcome Measure Help needed turning from your back to your side while in a flat bed without using bedrails?: A Little Help needed moving from lying on your back to sitting on the side of a flat bed without using bedrails?: A Little Help needed moving to and from a bed to a chair (including a wheelchair)?: A Little Help needed standing up from a chair using your arms (e.g., wheelchair or bedside chair)?: A Little Help needed to walk in hospital room?: A Little Help needed climbing 3-5 steps with a railing? : A Lot 6 Click Score: 17    End of Session Equipment Utilized During Treatment: Gait belt Activity Tolerance: Patient tolerated treatment well Patient left: in chair;with call bell/phone within reach Nurse Communication: Mobility status PT Visit Diagnosis: Unsteadiness on feet (R26.81);Other abnormalities of gait and mobility (R26.89);Muscle  weakness (generalized) (M62.81)    Time: 1027-1050 PT Time Calculation (min) (ACUTE ONLY): 23 min   Charges:   PT Evaluation $PT Eval Moderate Complexity: 1 Mod PT Treatments $Gait Training: 8-22 mins         Lanney Gins, PT, DPT Supplemental Physical Therapist 10/12/18 11:04 AM Pager: [phone] Office: [phone]

## 2018-10-12 NOTE — Discharge Instructions (Signed)

## 2018-10-12 NOTE — Progress Notes (Signed)
[facility]/Trauma Progress Note  1 Day Post-Op   Assessment/Plan Principal Problem:   Abdominal pain Active Problems:   Benign essential HTN   S/P TAVR (transcatheter aortic valve replacement)   GERD (gastroesophageal reflux disease)   Benign prostatic hyperplasia   Depression   Chronic diastolic CHF (congestive heart failure) (HCC)   CKD (chronic kidney disease), stage III (HCC)  Symptomatic cholelithiasis  - S/P laparoscopic cholecystectomy with IOC, umbilical hernia repair Dr. Marlou Starks, 04/30 - IOC was neg  - significant post operative RUQ pain - Spoke with [name], daughter who stated the pt does not want to go home today due to RUQ pain  FEN: reg diet VTE: SCD's, okay for chemical prophylaxis from a surgical standpoint but will defer to medicine ID: none Follow up: CCS 2-3 weeks, in AVS  DISPO: recommend PT to work with pt. Pain control. Ambulate. Once pain is controlled pt will be ready for discharge from our standpoint.     LOS: 3 days    Subjective: CC: RUQ pain  Significant RUQ pain with movement and deep breaths. Not much pain at rest. Spoke to daughter and she assisted with interpretation. She states patient called her at 7:30am today complaining of pain and stating he doesn't want to go home today because of this pain. He denies nausea and he is eating.   Objective: Vital signs in last 24 hours: Temp:  [97.3 F (36.3 C)-98.1 F (36.7 C)] 98.1 F (36.7 C) (05/01 0618) Pulse Rate:  [62-79] 66 (05/01 0618) Resp:  [11-18] 17 (05/01 0618) BP: (117-144)/(48-66) 122/56 (05/01 0618) SpO2:  [93 %-100 %] 99 % (05/01 0618) Weight:  [72.8 kg] 72.8 kg (04/30 1032) Last BM Date: (PTA)  Intake/Output from previous day: 04/30 0701 - 05/01 0700 In: 1320 [P.O.:120; I.V.:1200] Out: 280 [Urine:250; Blood:30] Intake/Output this shift: Total I/O In: 360 [P.O.:360] Out: 375 [Urine:375]  PE: Gen:  Alert, NAD, pleasant, cooperative Pulm:  Rate and effort  normal Abd: Soft, ND, +BS, incisions with glue intact appear well and are without drainage or bleeding. TTP of RUQ with guarding. No peritonitis  Skin: no rashes noted, warm and dry   Anti-infectives: Anti-infectives (From admission, onward)   None      Lab Results:  Recent Labs    10/09/18 1736 10/10/18 0233  WBC 8.6 8.1  HGB 12.4* 11.6*  HCT 39.0 35.8*  PLT 207 187   BMET Recent Labs    10/09/18 1736 10/10/18 0233  NA 136 135  K 4.8 4.0  CL 100 103  CO2 27 25  GLUCOSE 150* 133*  BUN 14 11  CREATININE 1.20 1.01  CALCIUM 9.2 8.8*   PT/INR Recent Labs    10/09/18 2017  LABPROT 13.8  INR 1.1   CMP     Component Value Date/Time   NA 135 10/10/2018 0233   K 4.0 10/10/2018 0233   CL 103 10/10/2018 0233   CO2 25 10/10/2018 0233   GLUCOSE 133 (H) 10/10/2018 0233   BUN 11 10/10/2018 0233   CREATININE 1.01 10/10/2018 0233   CALCIUM 8.8 (L) 10/10/2018 0233   PROT 6.9 10/09/2018 1736   ALBUMIN 3.6 10/09/2018 1736   AST 21 10/09/2018 1736   ALT 20 10/09/2018 1736   ALKPHOS 88 10/09/2018 1736   BILITOT 1.1 10/09/2018 1736   GFRNONAA >60 10/10/2018 0233   GFRAA >60 10/10/2018 0233   Lipase     Component Value Date/Time   LIPASE 39 10/09/2018 1736    Studies/Results:  Dg Cholangiogram Operative  Result Date: 10/11/2018 CLINICAL DATA:  Cholelithiasis EXAM: INTRAOPERATIVE CHOLANGIOGRAM TECHNIQUE: Cholangiographic images from the C-arm fluoroscopic device were submitted for interpretation post-operatively. Please see the procedural report for the amount of contrast and the fluoroscopy time utilized. COMPARISON:  None. FINDINGS: Contrast fills the biliary tree and duodenum without filling defects in the common bile duct. IMPRESSION: Patent biliary tree. Electronically Signed   By: Marybelle Killings M.D.   On: 10/11/2018 13:48      Kalman Drape , [facility] 10/12/2018, 10:05 AM  Pager: [phone] Mon-Wed, Friday 7:00am-4:30pm Thurs  7am-11:30am  Consults: [phone]

## 2018-10-12 NOTE — TOC Progression Note (Signed)
Transition of Care [facility]) - Progression Note    Patient Details  Name: [name] MRN: [id number] Date of Birth: Feb 14, 1928  Transition of [facility] CM/SW Contact  Jacalyn Lefevre Edson Snowball, RN Phone Number: 10/12/2018, 2:19 PM  Clinical Narrative:     [name] will provide 24 hour supervision at discharge.  Expected Discharge Plan: [place] Barriers to Discharge: Continued Medical Work up  Expected Discharge Plan and Services Expected Discharge Plan: [place]   Discharge Planning Services: CM Consult   Living arrangements for the past 2 months: Single Family Home                 DME Arranged: N/A DME Agency: NA       HH Arranged: PT HH Agency: [place] ([place]) Date HH Agency Contacted: 10/12/18 Time [place]: 1418 Representative spoke with at [place]: [place] ([place]) Interventions    Readmission Risk Interventions Readmission Risk Prevention Plan 03/28/2018  Post Dischage Appt Complete  Medication Screening Complete  Transportation Screening Complete  PCP follow-up Complete  Some recent data might be hidden

## 2018-10-12 NOTE — Progress Notes (Signed)
PROGRESS NOTE    [name]  ALP:[id number] DOB: Mar 10, 1928 DOA: 10/09/2018 PCP: Lajean Manes, MD   Brief Narrative:  Per HPI: [name] a 85 y.o.malewith medical history significant ofHTN, BPH, GERD, rheumatoid arthritis,dCHF(EF 60-65% 04/25/18), aortic stenosis s/p TAVR 03/27/2018,hyperlipidemia, depression, Gilbert syndrome, hard of hearing, CKD-III,who presents with abdominal pain.  Pt has HOH and is a poor historian. I called family and spoke with herdaughter and granddaughter to havecollected more information from them. Patient was recently hospitalized from 4/11-4/15 due to choledocholithiasis. He hadMRCP revealedcholedocholithiasis. GIperformed ERCP 4/14 with complete removalof stones withbiliary sphincterotomy and balloon extraction.General surgeon was consulted, recommendedlap chole, however the patient'sfamilydid not want him to have surgery due to [place] restrictions and the language barrier. Per family,patient developed abdominal pain again formore than 24 hours, which is located right upper quadrant, moderate, sharp, nonradiating. Patient has nausea, and dry heaves no vomiting or diarrhea. Patient does not have fever or chills. He does not have chest pain, shortness of breath, cough. No symptoms of UTI or unilateral weakness.  ED Course:pt was found to have WBC 8.6, lactic acid 1.0, lipase 39,normal LFT,negative urinalysis, stable renal function, temperature normal, heart rate 55, oxygen saturation 93-98% on room air. Patient is admitted to telemetry bed as inpatient. General surgeon, Dr. Georgette Dover was consulted.   Assessment & Plan:   Principal Problem:   Abdominal pain Active Problems:   Benign essential HTN   S/P TAVR (transcatheter aortic valve replacement)   GERD (gastroesophageal reflux disease)   Benign prostatic hyperplasia   Depression   Chronic diastolic CHF (congestive heart failure) (HCC)   CKD (chronic kidney disease),  stage III (HCC)   Abdominal pain/symptomatic cholelithiasis.  As previously noted, this was likely due to symptomatic cholelithiasis with sludge is identified on the right upper quadrant ultrasound. General surgery saw the patient in consultation and patient is now postoperative day #1 from laparoscopic cholecystectomy,  Case discussed with patient's daughter by GS  and myself who is in agreement with plan of care. Continue with analgesics, antiemetics, because of abdominal pain patient remained in hospital additional day  Hypertension no changes, continue patient's home medications include Cozaar. IV hydralazine PRN with parameters  Chronic diastolic CHF not in acute exacerbation: Patient is n.p.o. holding Lasix will monitor volume status closely, follow BMPs daily.  Status post TAVR. No acute issues patient was seen by cardiology aortic valve stable.  CKD stage III. Avoid nephrotoxic agents, follow BMPs as above.  DVT prophylaxis: SCD/Compression stockings  Code Status: Full    Code Status Orders  (From admission, onward)         Start     Ordered   10/09/18 2006  Do not attempt resuscitation (DNR)  Continuous    Question Answer Comment  In the event of cardiac or respiratory ARREST Do not call a code blue   In the event of cardiac or respiratory ARREST Do not perform Intubation, CPR, defibrillation or ACLS   In the event of cardiac or respiratory ARREST Use medication by any route, position, wound care, and other measures to relive pain and suffering. May use oxygen, suction and manual treatment of airway obstruction as needed for comfort.      10/09/18 2005        Code Status History    Date Active Date Inactive Code Status Order ID Comments User Context   09/22/2018 1544 09/26/2018 1905 DNR [id number]  Doreatha Lew, MD ED   06/04/2018 2340 06/05/2018 1542 Full Code [id number]  Meade Maw, MD Inpatient   03/27/2018 1206 03/28/2018 1518 Full Code  [id number]  Crista Luria Inpatient   02/04/2018 1537 02/07/2018 2109 Full Code [id number]  Sid Falcon, MD Inpatient   08/28/2016 1218 09/06/2016 1711 Full Code [id number]  Cathlyn Parsons, PA-C Inpatient   08/28/2016 1218 08/28/2016 1218 Full Code [id number]  Elizabeth Sauer Inpatient   08/13/2016 2032 08/28/2016 1216 Full Code [id number]  Rolm Bookbinder, MD Inpatient   05/05/2015 1142 05/06/2015 1303 Full Code [id number]  Armandina Gemma, MD Inpatient     Family Communication: daughter [name]  Disposition Plan:   Remained inpatient an additional day secondary to uncontrolled pain postoperatively.  Will monitor recheck labs in the morning surgery aware and will follow along.  Patient requested not to be discharged because of pain Consults called: None Admission status: Inpatient   Consultants:   General surgery  Procedures:  Dg Cholangiogram Operative  Result Date: 10/11/2018 CLINICAL DATA:  Cholelithiasis EXAM: INTRAOPERATIVE CHOLANGIOGRAM TECHNIQUE: Cholangiographic images from the C-arm fluoroscopic device were submitted for interpretation post-operatively. Please see the procedural report for the amount of contrast and the fluoroscopy time utilized. COMPARISON:  None. FINDINGS: Contrast fills the biliary tree and duodenum without filling defects in the common bile duct. IMPRESSION: Patent biliary tree. Electronically Signed   By: Marybelle Killings M.D.   On: 10/11/2018 13:48   Mr 3d Recon At Scanner  Result Date: 09/24/2018 CLINICAL DATA:  85 year old male with history of common bile duct stone and biliary obstruction. EXAM: MRI ABDOMEN WITHOUT AND WITH CONTRAST (INCLUDING MRCP) TECHNIQUE: Multiplanar multisequence MR imaging of the abdomen was performed both before and after the administration of intravenous contrast. Heavily T2-weighted images of the biliary and pancreatic ducts were obtained, and three-dimensional MRCP images were rendered by post processing. CONTRAST:  8 mL  of Gadavist. COMPARISON:  No priors. Abdominal ultrasound 09/22/2018. CT the abdomen and pelvis 02/07/2018. FINDINGS: Comment: Today's study is slightly limited by patient respiratory motion. Lower chest: Unremarkable. Hepatobiliary: No suspicious cystic or solid hepatic lesions. No intrahepatic biliary ductal dilatation. Common bile duct is borderline dilated measuring 7 mm in the porta hepatis (within normal limits for the patient's age). Several small filling defects are noted in the common bile duct, largest of which measures up to 5 mm distally shortly before the ampulla. In addition, amorphous filling defects are noted in the gallbladder, indicative of a combination of biliary sludge and small gallstones. Gallbladder is moderately distended, with some mild gallbladder wall thickening (6 mm) and increased T2 signal intensity in the wall, indicative of edema. Trace volume of T2 signal intensity adjacent to the gallbladder, indicative of pericholecystic fluid. This is contiguous with increased T2 signal intensity adjacent to the descending portion of the duodenum. Pancreas: No pancreatic mass. Small amount of increased T2 signal intensity adjacent to the head of the pancreas and the adjacent descending duodenum. No pancreatic ductal dilatation noted on MRCP images. No peripancreatic fluid collections. Spleen:  Unremarkable. Adrenals/Urinary Tract: Multiple T1 hypointense, T2 hyperintense, nonenhancing lesions in both kidneys, compatible with simple cysts, largest of which is exophytic in the anterior aspect of the interpolar region of the right kidney measuring 3.6 cm in diameter. No definite suspicious renal lesions. No hydroureteronephrosis in the visualized portions of the abdomen. Bilateral adrenal glands are normal in appearance. Stomach/Bowel: Normal appearance of the stomach. Increased T2 signal intensity surrounding the descending portion of the duodenum. Remaining portions of visualized small bowel and  colon are unremarkable. Vascular/Lymphatic:  Aortic atherosclerosis, without evidence of aneurysm in the abdominal vasculature. No lymphadenopathy noted in the abdomen. Other: No significant volume of ascites in the visualized portions of the peritoneal cavity. Musculoskeletal: No aggressive appearing osseous lesions are noted in the visualized portions of the skeleton. IMPRESSION: 1. Study is positive for biliary sludge and cholelithiasis as well as choledocholithiasis. At this time, there is no evidence of frank biliary tract obstruction despite the presence of common bile duct stones measuring up to 5 mm in the periampullary region. Notably, the gallbladder is moderately distended with some mild gallbladder wall thickening and edema, as well as trace amount of pericholecystic fluid. There is also some fluid adjacent to the head of the pancreas and descending portion of the duodenum, which could indicate an associated pancreatitis. 2. Aortic atherosclerosis. Electronically Signed   By: Vinnie Langton M.D.   On: 09/24/2018 11:50   Dg Chest Port 1 View  Result Date: 09/22/2018 CLINICAL DATA:  Acute onset of mid chest pain that began last night. Current history of CHF and hypertension. Prior [place]. EXAM: PORTABLE CHEST 1 VIEW COMPARISON:  CTA chest 06/04/2018. Chest x-rays 06/04/2018 and earlier. FINDINGS: Cardiac silhouette normal in size, unchanged. Prior CABG. Thoracic aorta mildly atherosclerotic and tortuous. Hilar and mediastinal contours otherwise unremarkable. Mildly prominent bronchovascular markings diffusely and mild central peribronchial thickening, unchanged. Lungs otherwise clear. No localized airspace consolidation. No pleural effusions. No pneumothorax. Normal pulmonary vascularity. IMPRESSION: Stable mild changes of chronic bronchitis and/or asthma. No acute cardiopulmonary disease. Electronically Signed   By: Evangeline Dakin M.D.   On: 09/22/2018 11:25   Dg Ercp Biliary & Pancreatic  Ducts  Result Date: 09/25/2018 CLINICAL DATA:  Common bile duct calculus EXAM: ERCP TECHNIQUE: Multiple spot images obtained with the fluoroscopic device and submitted for interpretation post-procedure. FLUOROSCOPY TIME:  Fluoroscopy Time:  3 minutes and 17 seconds Radiation Exposure Index (if provided by the fluoroscopic device): Number of Acquired Spot Images: 0 COMPARISON:  None. FINDINGS: Images demonstrate cannulation of the common bile duct followed by balloon stone retrieval. IMPRESSION: See above. These images were submitted for radiologic interpretation only. Please see the procedural report for the amount of contrast and the fluoroscopy time utilized. Electronically Signed   By: Marybelle Killings M.D.   On: 09/25/2018 14:41   Mr Abdomen Mrcp Moise Boring Contast  Result Date: 09/24/2018 CLINICAL DATA:  85 year old male with history of common bile duct stone and biliary obstruction. EXAM: MRI ABDOMEN WITHOUT AND WITH CONTRAST (INCLUDING MRCP) TECHNIQUE: Multiplanar multisequence MR imaging of the abdomen was performed both before and after the administration of intravenous contrast. Heavily T2-weighted images of the biliary and pancreatic ducts were obtained, and three-dimensional MRCP images were rendered by post processing. CONTRAST:  8 mL of Gadavist. COMPARISON:  No priors. Abdominal ultrasound 09/22/2018. CT the abdomen and pelvis 02/07/2018. FINDINGS: Comment: Today's study is slightly limited by patient respiratory motion. Lower chest: Unremarkable. Hepatobiliary: No suspicious cystic or solid hepatic lesions. No intrahepatic biliary ductal dilatation. Common bile duct is borderline dilated measuring 7 mm in the porta hepatis (within normal limits for the patient's age). Several small filling defects are noted in the common bile duct, largest of which measures up to 5 mm distally shortly before the ampulla. In addition, amorphous filling defects are noted in the gallbladder, indicative of a combination of  biliary sludge and small gallstones. Gallbladder is moderately distended, with some mild gallbladder wall thickening (6 mm) and increased T2 signal intensity in the wall, indicative of edema.  Trace volume of T2 signal intensity adjacent to the gallbladder, indicative of pericholecystic fluid. This is contiguous with increased T2 signal intensity adjacent to the descending portion of the duodenum. Pancreas: No pancreatic mass. Small amount of increased T2 signal intensity adjacent to the head of the pancreas and the adjacent descending duodenum. No pancreatic ductal dilatation noted on MRCP images. No peripancreatic fluid collections. Spleen:  Unremarkable. Adrenals/Urinary Tract: Multiple T1 hypointense, T2 hyperintense, nonenhancing lesions in both kidneys, compatible with simple cysts, largest of which is exophytic in the anterior aspect of the interpolar region of the right kidney measuring 3.6 cm in diameter. No definite suspicious renal lesions. No hydroureteronephrosis in the visualized portions of the abdomen. Bilateral adrenal glands are normal in appearance. Stomach/Bowel: Normal appearance of the stomach. Increased T2 signal intensity surrounding the descending portion of the duodenum. Remaining portions of visualized small bowel and colon are unremarkable. Vascular/Lymphatic: Aortic atherosclerosis, without evidence of aneurysm in the abdominal vasculature. No lymphadenopathy noted in the abdomen. Other: No significant volume of ascites in the visualized portions of the peritoneal cavity. Musculoskeletal: No aggressive appearing osseous lesions are noted in the visualized portions of the skeleton. IMPRESSION: 1. Study is positive for biliary sludge and cholelithiasis as well as choledocholithiasis. At this time, there is no evidence of frank biliary tract obstruction despite the presence of common bile duct stones measuring up to 5 mm in the periampullary region. Notably, the gallbladder is moderately  distended with some mild gallbladder wall thickening and edema, as well as trace amount of pericholecystic fluid. There is also some fluid adjacent to the head of the pancreas and descending portion of the duodenum, which could indicate an associated pancreatitis. 2. Aortic atherosclerosis. Electronically Signed   By: Vinnie Langton M.D.   On: 09/24/2018 11:50   US Abdomen Limited Ruq  Result Date: 10/09/2018 CLINICAL DATA:  Initial evaluation for acute right upper quadrant pain. EXAM: ULTRASOUND ABDOMEN LIMITED RIGHT UPPER QUADRANT COMPARISON:  Prior MRI from 09/24/2018 FINDINGS: Gallbladder: Nonshadowing layering echogenic material within the gallbladder lumen most consistent with sludge. No frank shadowing cholelithiasis. Gallbladder wall measures within normal limits of 1.8 mm. No free pericholecystic fluid. No sonographic Murphy sign elicited on exam. Common bile duct: Diameter: 5.5 mm.  No choledocholithiasis. Liver: No focal lesion identified. Within normal limits in parenchymal echogenicity. Portal vein is patent on color Doppler imaging with normal direction of blood flow towards the liver. Incidental note made of 2 simple right renal cysts measuring 1.6 x 2.1 x 1.9 cm and 3.7 x 3.5 x 2.9 cm. IMPRESSION: 1. Gallbladder sludge without frank cholelithiasis. No sonographic features to suggest acute cholecystitis. 2. No biliary dilatation or choledocholithiasis by ultrasound. 3. Incidental simple right renal cysts as above. Electronically Signed   By: Jeannine Boga M.D.   On: 10/09/2018 18:42   US Abdomen Limited Ruq  Result Date: 09/22/2018 CLINICAL DATA:  Elevated liver function test. EXAM: ULTRASOUND ABDOMEN LIMITED RIGHT UPPER QUADRANT COMPARISON:  None. FINDINGS: Gallbladder: Mixed echogenicity material within the gallbladder, presumably sludge. Hyperechoic masslike focus within the gallbladder measures 1.3 x 1.2 cm, most likely tumefactive sludge. No gallbladder wall thickening or  pericholecystic fluid. No sonographic Murphy's sign elicited during the exam. Common bile duct: Diameter: 5 mm Liver: Liver echotexture is coarse and there is nodularity of the peripheral liver margins suggesting cirrhosis. No focal mass or lesion within the liver. Portal vein is patent on color Doppler imaging with normal direction of blood flow towards the liver. Incidental note  of a RIGHT renal cyst, measuring 3.6 cm, corresponding to a benign cyst demonstrated on CT dated 02/07/2018. IMPRESSION: 1. No evidence of cholecystitis. No discrete gallstones identified. Hyperechoic masslike structure within the gallbladder measures 1.3 cm, most likely tumefactive sludge. Additional layering sludge within the gallbladder. 2. No bile duct dilatation. 3. Probable liver cirrhosis. Electronically Signed   By: Franki Cabot M.D.   On: 09/22/2018 13:22     Antimicrobials:   Perioperative per general surgery   Subjective: Patient reports postoperative pain right upper quadrant.  Of note it was inflamed gallbladder and it was a more complex procedure.  Objective: Vitals:   10/11/18 1447 10/11/18 2100 10/12/18 0618 10/12/18 1301  BP: (!) 130/57 (!) 117/48 (!) 122/56 (!) 114/57  Pulse: 66 62 66 (!) 58  Resp: 16 18 17    Temp: 98.1 F (36.7 C) 98 F (36.7 C) 98.1 F (36.7 C) 97.9 F (36.6 C)  TempSrc: Oral Oral Oral Oral  SpO2: 97% 100% 99% 94%  Weight:      Height:        Intake/Output Summary (Last 24 hours) at 10/12/2018 1547 Last data filed at 10/12/2018 0850 Gross per 24 hour  Intake 480 ml  Output 625 ml  Net -145 ml   Filed Weights   10/09/18 2225 10/11/18 0300 10/11/18 1032  Weight: 72.8 kg 72.8 kg 72.8 kg    Examination:  General exam: Appears calm and comfortable  Respiratory system: Clear to auscultation. Respiratory effort normal. Cardiovascular system: S1 & S2 heard, RRR. No JVD, murmurs, rubs, gallops or clicks. No pedal edema. Gastrointestinal system: Mildly tender right upper  quadrant, distant bowel sounds Central nervous system: Alert and oriented. No focal neurological deficits. Extremities: Warm well perfused, no edema r. Skin: No rashes, lesions or ulcers Psychiatry: Judgement and insight appear normal. Mood & affect appropriate.     Data Reviewed: I have personally reviewed following labs and imaging studies  CBC: Recent Labs  Lab 10/09/18 1736 10/10/18 0233  WBC 8.6 8.1  HGB 12.4* 11.6*  HCT 39.0 35.8*  MCV 91.5 89.5  PLT 207 338   Basic Metabolic Panel: Recent Labs  Lab 10/09/18 1736 10/10/18 0233  NA 136 135  K 4.8 4.0  CL 100 103  CO2 27 25  GLUCOSE 150* 133*  BUN 14 11  CREATININE 1.20 1.01  CALCIUM 9.2 8.8*   GFR: Estimated Creatinine Clearance: 44.3 mL/min (by C-G formula based on SCr of 1.01 mg/dL). Liver Function Tests: Recent Labs  Lab 10/09/18 1736  AST 21  ALT 20  ALKPHOS 88  BILITOT 1.1  PROT 6.9  ALBUMIN 3.6   Recent Labs  Lab 10/09/18 1736  LIPASE 39   No results for input(s): AMMONIA in the last 168 hours. Coagulation Profile: Recent Labs  Lab 10/09/18 2017  INR 1.1   Cardiac Enzymes: No results for input(s): CKTOTAL, CKMB, CKMBINDEX, TROPONINI in the last 168 hours. BNP (last 3 results) No results for input(s): PROBNP in the last 8760 hours. HbA1C: No results for input(s): HGBA1C in the last 72 hours. CBG: No results for input(s): GLUCAP in the last 168 hours. Lipid Profile: No results for input(s): CHOL, HDL, LDLCALC, TRIG, CHOLHDL, LDLDIRECT in the last 72 hours. Thyroid Function Tests: No results for input(s): TSH, T4TOTAL, FREET4, T3FREE, THYROIDAB in the last 72 hours. Anemia Panel: No results for input(s): VITAMINB12, FOLATE, FERRITIN, TIBC, IRON, RETICCTPCT in the last 72 hours. Sepsis Labs: Recent Labs  Lab 10/09/18 1736  LATICACIDVEN 1.0  Recent Results (from the past 240 hour(s))  MRSA PCR Screening     Status: None   Collection Time: 10/10/18  5:51 PM  Result Value Ref  Range Status   MRSA by PCR NEGATIVE NEGATIVE Final    Comment:        The GeneXpert MRSA Assay (FDA approved for NASAL specimens only), is one component of a comprehensive MRSA colonization surveillance program. It is not intended to diagnose MRSA infection nor to guide or monitor treatment for MRSA infections. Performed at [facility], [place] [address] [place], [place] [address]          Radiology Studies: Dg Cholangiogram Operative  Result Date: 10/11/2018 CLINICAL DATA:  Cholelithiasis EXAM: INTRAOPERATIVE CHOLANGIOGRAM TECHNIQUE: Cholangiographic images from the C-arm fluoroscopic device were submitted for interpretation post-operatively. Please see the procedural report for the amount of contrast and the fluoroscopy time utilized. COMPARISON:  None. FINDINGS: Contrast fills the biliary tree and duodenum without filling defects in the common bile duct. IMPRESSION: Patent biliary tree. Electronically Signed   By: Marybelle Killings M.D.   On: 10/11/2018 13:48        Scheduled Meds:  acetaminophen  650 mg Oral Q6H   aspirin EC  81 mg Oral Daily   dutasteride  0.5 mg Oral Daily   escitalopram  20 mg Oral QHS   losartan  25 mg Oral Daily   omega-3 acid ethyl esters  1 g Oral Daily   vitamin C  1,000 mg Oral QPC lunch   Continuous Infusions:  lactated ringers 10 mL/hr at 10/11/18 1028     LOS: 3 days    Time spent: 35 min    Nicolette Bang, MD [facility]  If 7PM-7AM, please contact night-coverage  10/12/2018, 3:47 PM

## 2018-10-12 NOTE — Discharge Summary (Signed)
Physician Discharge Summary  [name] NAT:[id number] DOB: 29-Mar-1928 DOA: 10/09/2018  PCP: Ryan Manes, MD  Admit date: 10/09/2018 Discharge date: 10/12/2018  Admitted From: Inpatient Disposition: home  Recommendations for Outpatient Follow-up:  1. Follow up with PCP in 1-2 weeks 2. Please obtain BMP/CBC in one week   Home Health:No Equipment/Devices:none  Discharge Condition:Stable CODE STATUS:Full code Diet recommendation: Regular healthy diet  Brief/Interim Summary: Per HPI: Ryan Ilyasovis a 85 y.o.malewith medical history significant ofHTN, BPH, GERD, rheumatoid arthritis,dCHF(EF 60-65% 04/25/18), aortic stenosis s/p TAVR 03/27/2018,hyperlipidemia, depression, Gilbert syndrome, hard of hearing, CKD-III,who presents with abdominal pain.  Pt has HOH and is a poor historian. I called family and spoke with herdaughter and granddaughter to havecollected more information from them. Patient was recently hospitalized from 4/11-4/15 due to choledocholithiasis. He hadMRCP revealedcholedocholithiasis. GIperformed ERCP 4/14 with complete removalof stones withbiliary sphincterotomy and balloon extraction.General surgeon was consulted, recommendedlap chole, however the patient'sfamilydid not want him to have surgery due to [place] restrictions and the language barrier. Per family,patient developed abdominal pain again formore than 24 hours, which is located right upper quadrant, moderate, sharp, nonradiating. Patient has nausea, and dry heaves no vomiting or diarrhea. Patient does not have fever or chills. He does not have chest pain, shortness of breath, cough. No symptoms of UTI or unilateral weakness.  ED Course:pt was found to have WBC 8.6, lactic acid 1.0, lipase 39,normal LFT,negative urinalysis, stable renal function, temperature normal, heart rate 55, oxygen saturation 93-98% on room air. Patient is admitted to telemetry bed as inpatient. General  surgeon, Dr. Georgette Boyle was consulted.  Hospital course: Patient present with abdominal pain which was thought to be symptomatic cholelithiasis.  He was seen by general surgery and underwent laparoscopic cholecystectomy.  Initially patient was reluctant be discharged home today because of abdominal pain although that improved with ambulation.  He is tolerating diet and has to be discharged home today.  He will follow-up postoperatively with general surgery per their recommendations with wound care per general surgery.  Patient will continue his home medications for hypertension as well as his home medications for management of his chronic diastolic congestive heart failure.  He has known CKD stage III for which we avoid nephrotoxic agents while in the hospital.  He was also seen by cardiology because he was TAVR and concerns for risk with surgery.  Patient tolerated the surgery without complications and requested to be discharged home today.    Discharge Diagnoses:  Principal Problem:   Abdominal pain Active Problems:   Benign essential HTN   S/P TAVR (transcatheter aortic valve replacement)   GERD (gastroesophageal reflux disease)   Benign prostatic hyperplasia   Depression   Chronic diastolic CHF (congestive heart failure) (HCC)   CKD (chronic kidney disease), stage III [facility])    Discharge Instructions  Discharge Instructions    Call MD for:   Complete by:  As directed    Any acute change in medical condition   Diet general   Complete by:  As directed    Discharge wound care:   Complete by:  As directed    Per general surgery   Increase activity slowly   Complete by:  As directed      Allergies as of 10/12/2018      Reactions   Ativan [lorazepam] Other (See Comments)   hallucinations   Phenergan [promethazine Hcl] Hypertension   Eggs Or Egg-derived Products Rash   Small rash after eating for several days in a row  Medication List    STOP taking these medications    amoxicillin-clavulanate 875-125 MG tablet Commonly known as:  AUGMENTIN   traMADol 50 MG tablet Commonly known as:  ULTRAM     TAKE these medications   aspirin 81 MG tablet Take 1 tablet (81 mg total) by mouth daily.   cholecalciferol 25 MCG (1000 UT) tablet Commonly known as:  VITAMIN D3 Take 1,000 Units by mouth daily.   dutasteride 0.5 MG capsule Commonly known as:  AVODART Take 0.5 mg by mouth daily.   escitalopram 20 MG tablet Commonly known as:  LEXAPRO Take 1 tablet (20 mg total) by mouth daily. What changed:  when to take this   Fish Oil 1000 MG Caps Take 1,000 mg by mouth daily.   folic acid 1 MG tablet Commonly known as:  FOLVITE Take 1 mg by mouth every evening.   furosemide 20 MG tablet Commonly known as:  LASIX Take 20 mg by mouth.   losartan 25 MG tablet Commonly known as:  COZAAR Take 25 mg by mouth daily.   Melatonin 5 MG Tabs Take 5 mg by mouth at bedtime as needed (for sleep).   oxyCODONE 5 MG immediate release tablet Commonly known as:  Oxy IR/ROXICODONE Take 1 tablet (5 mg total) by mouth every 6 (six) hours as needed for up to 3 days for moderate pain.   pantoprazole 40 MG tablet Commonly known as:  PROTONIX Take 1 tablet (40 mg total) by mouth daily.   vitamin C 1000 MG tablet Take 1,000 mg by mouth daily after lunch.   vitamin E 400 UNIT capsule Take 400 Units by mouth daily after lunch.            Discharge Care Instructions  (From admission, onward)         Start     Ordered   10/12/18 0000  Discharge wound care:    Comments:  Per general surgery   10/12/18 1634         Follow-up [facility] Go on 11/01/2018.   Specialty:  General Surgery Why:  at 1:30pm for your follow up appointment. Please arrive 30 minutes prior to complete paperwork. Please bring photo ID and insurance card.  Contact information: [place] [phone]         Allergies  Allergen Reactions  . Ativan [Lorazepam] Other (See Comments)    hallucinations  . Phenergan [Promethazine Hcl] Hypertension  . Eggs Or Egg-Derived Products Rash    Small rash after eating for several days in a row    Consultations:  gen surgery   Procedures/Studies: Dg Cholangiogram Operative  Result Date: 10/11/2018 CLINICAL DATA:  Cholelithiasis EXAM: INTRAOPERATIVE CHOLANGIOGRAM TECHNIQUE: Cholangiographic images from the C-arm fluoroscopic device were submitted for interpretation post-operatively. Please see the procedural report for the amount of contrast and the fluoroscopy time utilized. COMPARISON:  None. FINDINGS: Contrast fills the biliary tree and duodenum without filling defects in the common bile duct. IMPRESSION: Patent biliary tree. Electronically Signed   By: Marybelle Killings M.D.   On: 10/11/2018 13:48   Mr 3d Recon At Scanner  Result Date: 09/24/2018 CLINICAL DATA:  85 year old male with history of common bile duct stone and biliary obstruction. EXAM: MRI ABDOMEN WITHOUT AND WITH CONTRAST (INCLUDING MRCP) TECHNIQUE: Multiplanar multisequence MR imaging of the abdomen was performed both before and after the administration of intravenous contrast. Heavily T2-weighted images of the  biliary and pancreatic ducts were obtained, and three-dimensional MRCP images were rendered by post processing. CONTRAST:  8 mL of Gadavist. COMPARISON:  No priors. Abdominal ultrasound 09/22/2018. CT the abdomen and pelvis 02/07/2018. FINDINGS: Comment: Today's study is slightly limited by patient respiratory motion. Lower chest: Unremarkable. Hepatobiliary: No suspicious cystic or solid hepatic lesions. No intrahepatic biliary ductal dilatation. Common bile duct is borderline dilated measuring 7 mm in the porta hepatis (within normal limits for the patient's age). Several small filling defects are noted in the common bile duct, largest of which measures up to  5 mm distally shortly before the ampulla. In addition, amorphous filling defects are noted in the gallbladder, indicative of a combination of biliary sludge and small gallstones. Gallbladder is moderately distended, with some mild gallbladder wall thickening (6 mm) and increased T2 signal intensity in the wall, indicative of edema. Trace volume of T2 signal intensity adjacent to the gallbladder, indicative of pericholecystic fluid. This is contiguous with increased T2 signal intensity adjacent to the descending portion of the duodenum. Pancreas: No pancreatic mass. Small amount of increased T2 signal intensity adjacent to the head of the pancreas and the adjacent descending duodenum. No pancreatic ductal dilatation noted on MRCP images. No peripancreatic fluid collections. Spleen:  Unremarkable. Adrenals/Urinary Tract: Multiple T1 hypointense, T2 hyperintense, nonenhancing lesions in both kidneys, compatible with simple cysts, largest of which is exophytic in the anterior aspect of the interpolar region of the right kidney measuring 3.6 cm in diameter. No definite suspicious renal lesions. No hydroureteronephrosis in the visualized portions of the abdomen. Bilateral adrenal glands are normal in appearance. Stomach/Bowel: Normal appearance of the stomach. Increased T2 signal intensity surrounding the descending portion of the duodenum. Remaining portions of visualized small bowel and colon are unremarkable. Vascular/Lymphatic: Aortic atherosclerosis, without evidence of aneurysm in the abdominal vasculature. No lymphadenopathy noted in the abdomen. Other: No significant volume of ascites in the visualized portions of the peritoneal cavity. Musculoskeletal: No aggressive appearing osseous lesions are noted in the visualized portions of the skeleton. IMPRESSION: 1. Study is positive for biliary sludge and cholelithiasis as well as choledocholithiasis. At this time, there is no evidence of frank biliary tract  obstruction despite the presence of common bile duct stones measuring up to 5 mm in the periampullary region. Notably, the gallbladder is moderately distended with some mild gallbladder wall thickening and edema, as well as trace amount of pericholecystic fluid. There is also some fluid adjacent to the head of the pancreas and descending portion of the duodenum, which could indicate an associated pancreatitis. 2. Aortic atherosclerosis. Electronically Signed   By: Vinnie Langton M.D.   On: 09/24/2018 11:50   Dg Chest Port 1 View  Result Date: 09/22/2018 CLINICAL DATA:  Acute onset of mid chest pain that began last night. Current history of CHF and hypertension. Prior [place]. EXAM: PORTABLE CHEST 1 VIEW COMPARISON:  CTA chest 06/04/2018. Chest x-rays 06/04/2018 and earlier. FINDINGS: Cardiac silhouette normal in size, unchanged. Prior CABG. Thoracic aorta mildly atherosclerotic and tortuous. Hilar and mediastinal contours otherwise unremarkable. Mildly prominent bronchovascular markings diffusely and mild central peribronchial thickening, unchanged. Lungs otherwise clear. No localized airspace consolidation. No pleural effusions. No pneumothorax. Normal pulmonary vascularity. IMPRESSION: Stable mild changes of chronic bronchitis and/or asthma. No acute cardiopulmonary disease. Electronically Signed   By: Evangeline Dakin M.D.   On: 09/22/2018 11:25   Dg Ercp Biliary & Pancreatic Ducts  Result Date: 09/25/2018 CLINICAL DATA:  Common bile duct calculus EXAM: ERCP TECHNIQUE: Multiple  spot images obtained with the fluoroscopic device and submitted for interpretation post-procedure. FLUOROSCOPY TIME:  Fluoroscopy Time:  3 minutes and 17 seconds Radiation Exposure Index (if provided by the fluoroscopic device): Number of Acquired Spot Images: 0 COMPARISON:  None. FINDINGS: Images demonstrate cannulation of the common bile duct followed by balloon stone retrieval. IMPRESSION: See above. These images were submitted  for radiologic interpretation only. Please see the procedural report for the amount of contrast and the fluoroscopy time utilized. Electronically Signed   By: Marybelle Killings M.D.   On: 09/25/2018 14:41   Mr Abdomen Mrcp Moise Boring Contast  Result Date: 09/24/2018 CLINICAL DATA:  85 year old male with history of common bile duct stone and biliary obstruction. EXAM: MRI ABDOMEN WITHOUT AND WITH CONTRAST (INCLUDING MRCP) TECHNIQUE: Multiplanar multisequence MR imaging of the abdomen was performed both before and after the administration of intravenous contrast. Heavily T2-weighted images of the biliary and pancreatic ducts were obtained, and three-dimensional MRCP images were rendered by post processing. CONTRAST:  8 mL of Gadavist. COMPARISON:  No priors. Abdominal ultrasound 09/22/2018. CT the abdomen and pelvis 02/07/2018. FINDINGS: Comment: Today's study is slightly limited by patient respiratory motion. Lower chest: Unremarkable. Hepatobiliary: No suspicious cystic or solid hepatic lesions. No intrahepatic biliary ductal dilatation. Common bile duct is borderline dilated measuring 7 mm in the porta hepatis (within normal limits for the patient's age). Several small filling defects are noted in the common bile duct, largest of which measures up to 5 mm distally shortly before the ampulla. In addition, amorphous filling defects are noted in the gallbladder, indicative of a combination of biliary sludge and small gallstones. Gallbladder is moderately distended, with some mild gallbladder wall thickening (6 mm) and increased T2 signal intensity in the wall, indicative of edema. Trace volume of T2 signal intensity adjacent to the gallbladder, indicative of pericholecystic fluid. This is contiguous with increased T2 signal intensity adjacent to the descending portion of the duodenum. Pancreas: No pancreatic mass. Small amount of increased T2 signal intensity adjacent to the head of the pancreas and the adjacent descending  duodenum. No pancreatic ductal dilatation noted on MRCP images. No peripancreatic fluid collections. Spleen:  Unremarkable. Adrenals/Urinary Tract: Multiple T1 hypointense, T2 hyperintense, nonenhancing lesions in both kidneys, compatible with simple cysts, largest of which is exophytic in the anterior aspect of the interpolar region of the right kidney measuring 3.6 cm in diameter. No definite suspicious renal lesions. No hydroureteronephrosis in the visualized portions of the abdomen. Bilateral adrenal glands are normal in appearance. Stomach/Bowel: Normal appearance of the stomach. Increased T2 signal intensity surrounding the descending portion of the duodenum. Remaining portions of visualized small bowel and colon are unremarkable. Vascular/Lymphatic: Aortic atherosclerosis, without evidence of aneurysm in the abdominal vasculature. No lymphadenopathy noted in the abdomen. Other: No significant volume of ascites in the visualized portions of the peritoneal cavity. Musculoskeletal: No aggressive appearing osseous lesions are noted in the visualized portions of the skeleton. IMPRESSION: 1. Study is positive for biliary sludge and cholelithiasis as well as choledocholithiasis. At this time, there is no evidence of frank biliary tract obstruction despite the presence of common bile duct stones measuring up to 5 mm in the periampullary region. Notably, the gallbladder is moderately distended with some mild gallbladder wall thickening and edema, as well as trace amount of pericholecystic fluid. There is also some fluid adjacent to the head of the pancreas and descending portion of the duodenum, which could indicate an associated pancreatitis. 2. Aortic atherosclerosis. Electronically Signed  By: Vinnie Langton M.D.   On: 09/24/2018 11:50   US Abdomen Limited Ruq  Result Date: 10/09/2018 CLINICAL DATA:  Initial evaluation for acute right upper quadrant pain. EXAM: ULTRASOUND ABDOMEN LIMITED RIGHT UPPER QUADRANT  COMPARISON:  Prior MRI from 09/24/2018 FINDINGS: Gallbladder: Nonshadowing layering echogenic material within the gallbladder lumen most consistent with sludge. No frank shadowing cholelithiasis. Gallbladder wall measures within normal limits of 1.8 mm. No free pericholecystic fluid. No sonographic Murphy sign elicited on exam. Common bile duct: Diameter: 5.5 mm.  No choledocholithiasis. Liver: No focal lesion identified. Within normal limits in parenchymal echogenicity. Portal vein is patent on color Doppler imaging with normal direction of blood flow towards the liver. Incidental note made of 2 simple right renal cysts measuring 1.6 x 2.1 x 1.9 cm and 3.7 x 3.5 x 2.9 cm. IMPRESSION: 1. Gallbladder sludge without frank cholelithiasis. No sonographic features to suggest acute cholecystitis. 2. No biliary dilatation or choledocholithiasis by ultrasound. 3. Incidental simple right renal cysts as above. Electronically Signed   By: Jeannine Boga M.D.   On: 10/09/2018 18:42   US Abdomen Limited Ruq  Result Date: 09/22/2018 CLINICAL DATA:  Elevated liver function test. EXAM: ULTRASOUND ABDOMEN LIMITED RIGHT UPPER QUADRANT COMPARISON:  None. FINDINGS: Gallbladder: Mixed echogenicity material within the gallbladder, presumably sludge. Hyperechoic masslike focus within the gallbladder measures 1.3 x 1.2 cm, most likely tumefactive sludge. No gallbladder wall thickening or pericholecystic fluid. No sonographic Murphy's sign elicited during the exam. Common bile duct: Diameter: 5 mm Liver: Liver echotexture is coarse and there is nodularity of the peripheral liver margins suggesting cirrhosis. No focal mass or lesion within the liver. Portal vein is patent on color Doppler imaging with normal direction of blood flow towards the liver. Incidental note of a RIGHT renal cyst, measuring 3.6 cm, corresponding to a benign cyst demonstrated on CT dated 02/07/2018. IMPRESSION: 1. No evidence of cholecystitis. No discrete  gallstones identified. Hyperechoic masslike structure within the gallbladder measures 1.3 cm, most likely tumefactive sludge. Additional layering sludge within the gallbladder. 2. No bile duct dilatation. 3. Probable liver cirrhosis. Electronically Signed   By: Franki Cabot M.D.   On: 09/22/2018 13:22       Subjective: No acute events.  Patient reported abdominal pain earlier but that is resolved.  He felt stable and requested to be discharged home today.  He was seen by general surgery were in agreement with plan.  Postop follow-up per general surgery  Discharge Exam: Vitals:   10/12/18 0618 10/12/18 1301  BP: (!) 122/56 (!) 114/57  Pulse: 66 (!) 58  Resp: 17   Temp: 98.1 F (36.7 C) 97.9 F (36.6 C)  SpO2: 99% 94%   Vitals:   10/11/18 1447 10/11/18 2100 10/12/18 0618 10/12/18 1301  BP: (!) 130/57 (!) 117/48 (!) 122/56 (!) 114/57  Pulse: 66 62 66 (!) 58  Resp: 16 18 17    Temp: 98.1 F (36.7 C) 98 F (36.7 C) 98.1 F (36.7 C) 97.9 F (36.6 C)  TempSrc: Oral Oral Oral Oral  SpO2: 97% 100% 99% 94%  Weight:      Height:        General: Pt is alert, awake, not in acute distress Cardiovascular: RRR, S1/S2 +, no rubs, no gallops Respiratory: CTA bilaterally, no wheezing, no rhonchi Abdominal: Soft, mildly tender but no focal findings not rigid no rebound no guarding, ND, bowel sounds + Extremities: no edema, no cyanosis    The results of significant diagnostics from this  hospitalization (including imaging, microbiology, ancillary and laboratory) are listed below for reference.     Microbiology: Recent Results (from the past 240 hour(s))  MRSA PCR Screening     Status: None   Collection Time: 10/10/18  5:51 PM  Result Value Ref Range Status   MRSA by PCR NEGATIVE NEGATIVE Final    Comment:        The GeneXpert MRSA Assay (FDA approved for NASAL specimens only), is one component of a comprehensive MRSA colonization surveillance program. It is not intended to  diagnose MRSA infection nor to guide or monitor treatment for MRSA infections. Performed at [facility], [place] [address] [place], [place] [address]      Labs: BNP (last 3 results) Recent Labs    06/04/18 1950 09/22/18 1045 10/09/18 2017  BNP 45.6 142.5* 46.6   Basic Metabolic Panel: Recent Labs  Lab 10/09/18 1736 10/10/18 0233  NA 136 135  K 4.8 4.0  CL 100 103  CO2 27 25  GLUCOSE 150* 133*  BUN 14 11  CREATININE 1.20 1.01  CALCIUM 9.2 8.8*   Liver Function Tests: Recent Labs  Lab 10/09/18 1736  AST 21  ALT 20  ALKPHOS 88  BILITOT 1.1  PROT 6.9  ALBUMIN 3.6   Recent Labs  Lab 10/09/18 1736  LIPASE 39   No results for input(s): AMMONIA in the last 168 hours. CBC: Recent Labs  Lab 10/09/18 1736 10/10/18 0233  WBC 8.6 8.1  HGB 12.4* 11.6*  HCT 39.0 35.8*  MCV 91.5 89.5  PLT 207 187   Cardiac Enzymes: No results for input(s): CKTOTAL, CKMB, CKMBINDEX, TROPONINI in the last 168 hours. BNP: Invalid input(s): POCBNP CBG: No results for input(s): GLUCAP in the last 168 hours. D-Dimer No results for input(s): DDIMER in the last 72 hours. Hgb A1c No results for input(s): HGBA1C in the last 72 hours. Lipid Profile No results for input(s): CHOL, HDL, LDLCALC, TRIG, CHOLHDL, LDLDIRECT in the last 72 hours. Thyroid function studies No results for input(s): TSH, T4TOTAL, T3FREE, THYROIDAB in the last 72 hours.  Invalid input(s): FREET3 Anemia work up No results for input(s): VITAMINB12, FOLATE, FERRITIN, TIBC, IRON, RETICCTPCT in the last 72 hours. Urinalysis    Component Value Date/Time   COLORURINE YELLOW 10/09/2018 1736   APPEARANCEUR CLEAR 10/09/2018 1736   LABSPEC 1.012 10/09/2018 1736   PHURINE 6.0 10/09/2018 1736   GLUCOSEU NEGATIVE 10/09/2018 1736   HGBUR NEGATIVE 10/09/2018 1736   BILIRUBINUR NEGATIVE 10/09/2018 1736   KETONESUR NEGATIVE 10/09/2018 1736   PROTEINUR NEGATIVE 10/09/2018 1736   UROBILINOGEN 1.0 04/14/2014 0116    NITRITE NEGATIVE 10/09/2018 1736   LEUKOCYTESUR NEGATIVE 10/09/2018 1736   Sepsis Labs Invalid input(s): PROCALCITONIN,  WBC,  LACTICIDVEN Microbiology Recent Results (from the past 240 hour(s))  MRSA PCR Screening     Status: None   Collection Time: 10/10/18  5:51 PM  Result Value Ref Range Status   MRSA by PCR NEGATIVE NEGATIVE Final    Comment:        The GeneXpert MRSA Assay (FDA approved for NASAL specimens only), is one component of a comprehensive MRSA colonization surveillance program. It is not intended to diagnose MRSA infection nor to guide or monitor treatment for MRSA infections. Performed at [facility], [place] [address], [place], [place] [address]      Time coordinating discharge: 35  minutes  SIGNED:   Nicolette Bang, MD  [facility] 10/12/2018, 4:34 PM Pager   If  7PM-7AM, please contact night-coverage [email or web address] Password [email or web address]

## 2018-10-17 DIAGNOSIS — R269 Unspecified abnormalities of gait and mobility: Secondary | ICD-10-CM | POA: Diagnosis not present

## 2018-10-17 DIAGNOSIS — K5901 Slow transit constipation: Secondary | ICD-10-CM | POA: Diagnosis not present

## 2018-10-17 DIAGNOSIS — M069 Rheumatoid arthritis, unspecified: Secondary | ICD-10-CM | POA: Diagnosis not present

## 2018-10-17 DIAGNOSIS — R1013 Epigastric pain: Secondary | ICD-10-CM | POA: Diagnosis not present

## 2018-10-17 DIAGNOSIS — I10 Essential (primary) hypertension: Secondary | ICD-10-CM | POA: Diagnosis not present

## 2018-10-26 DIAGNOSIS — R202 Paresthesia of skin: Secondary | ICD-10-CM | POA: Diagnosis not present

## 2018-10-26 DIAGNOSIS — R1310 Dysphagia, unspecified: Secondary | ICD-10-CM | POA: Diagnosis not present

## 2018-11-28 ENCOUNTER — Ambulatory Visit: Admit: 2018-11-28 | Payer: Medicare Other | Admitting: General Surgery

## 2018-11-28 SURGERY — LAPAROSCOPIC CHOLECYSTECTOMY
Anesthesia: General

## 2018-12-13 DIAGNOSIS — R894 Abnormal immunological findings in specimens from other organs, systems and tissues: Secondary | ICD-10-CM | POA: Diagnosis not present

## 2018-12-13 DIAGNOSIS — K7460 Unspecified cirrhosis of liver: Secondary | ICD-10-CM | POA: Diagnosis not present

## 2018-12-13 DIAGNOSIS — M25529 Pain in unspecified elbow: Secondary | ICD-10-CM | POA: Diagnosis not present

## 2018-12-13 DIAGNOSIS — Z79899 Other long term (current) drug therapy: Secondary | ICD-10-CM | POA: Diagnosis not present

## 2018-12-13 DIAGNOSIS — M0609 Rheumatoid arthritis without rheumatoid factor, multiple sites: Secondary | ICD-10-CM | POA: Diagnosis not present

## 2018-12-13 DIAGNOSIS — M25519 Pain in unspecified shoulder: Secondary | ICD-10-CM | POA: Diagnosis not present

## 2018-12-13 DIAGNOSIS — M1990 Unspecified osteoarthritis, unspecified site: Secondary | ICD-10-CM | POA: Diagnosis not present

## 2018-12-13 DIAGNOSIS — M810 Age-related osteoporosis without current pathological fracture: Secondary | ICD-10-CM | POA: Diagnosis not present

## 2019-02-22 ENCOUNTER — Other Ambulatory Visit (HOSPITAL_COMMUNITY): Payer: Medicare Other

## 2019-03-05 DIAGNOSIS — H833X9 Noise effects on inner ear, unspecified ear: Secondary | ICD-10-CM | POA: Diagnosis not present

## 2019-03-05 DIAGNOSIS — H9113 Presbycusis, bilateral: Secondary | ICD-10-CM | POA: Diagnosis not present

## 2019-03-05 DIAGNOSIS — H906 Mixed conductive and sensorineural hearing loss, bilateral: Secondary | ICD-10-CM | POA: Diagnosis not present

## 2019-04-24 NOTE — Progress Notes (Signed)
[facility] [place]                                       Cardiology Office Note    Date:  04/25/2019   ID:  [name], DOB 1928-04-23, MRN OF:[id number]  PCP:  Lajean Manes, MD  Cardiologist: Dr.Skains/ Dr. Angelena Form & Dr. Roxy Manns (TAVR)  CC: 1 year s/p TAVR  History of Present Illness:  [name] is a 85 y.o. male with a history of HTN, HLD, chronic diastolic CHF, RA on MTX,sinus bradycardia,depression and severe aortic stenosiss/p TAVR (03/27/18) who presents to clinic for follow up.   The patient was hospitalized to [facility] in 02/04/18 with symptoms of shortness of breath, abdominal bloating, chest pain and n/v. He was hospitalized and evaluated comprehensively and found to have no significant acute problems with exception of the presence of severe aortic stenosis. Peak velocity across aortic valve was measured 4.5 m/s corresponding to mean transvalvular gradient estimated 43 mmHg. There was normal left ventricular systolic function. The patient was referred to the multidisciplinary heart valve team and evaluated by Dr. Angelena Form. [facility] 02/06/18 showed normal mild nonobstructive coronary artery disease.   Heultimatelyunderwent TAVRsuccessful TAVR with a 23 mm Edwards Sapien 3 THV via the TF approach on 03/27/18.Post op echo showed EF 60%, normally functioning TAVR with mean gradient of 15 mm Hg and no mention of PVL on formal echo report. One month echo showed EF 60%, normally functioning TAVR with mean gradient of 14 mm HG (previously 15 mm hg) and new moderate PVL. Dr. Angelena Form and Dr. Roxy Manns felt that there was nothing to do in the absence of significant heart failure symptoms or symptomatic anemia.   The patient was seen in clinic on 04/03/18 for R groin pain and found to have what was thought to be a new hematoma. Follow up groin doppler was negative for active bleeding or pseudoaneurysm and CBC showed stable blood counts. He  continued to complain about this and review of pre TAVR CT scan showed a large right inguinal hernia with a short segment of ileum inside. He eventually underwent repair of this on 08/23/18.   He was admitted in 09/2018 for cholelithiasis/choledocholithiasis. He was treated with antibiotics and underwent laparoscopic cholecystectomy in 10/2018.   Today he presents to clinic for follow up. Here with an interpreter. No CP or SOB. No LE edema, orthopnea or PND. No dizziness or syncope. No blood in stool or urine. No palpitations. He does his exercises everyday as well as breathing exercises and feels quite well. Sometimes gets a shooting pain in his back.    Past Medical History:  Diagnosis Date   BPH (benign prostatic hypertrophy)    Depression    Diverticulosis of colon    GERD (gastroesophageal reflux disease)    Gilbert's syndrome    History of kidney stones    HLD (hyperlipidemia)    HOH (hard of hearing)    refuses to wear his Hearing Aid   Hypertension    Inguinal hernia    right   Internal hemorrhoid    Normal coronary arteries    a. Cath 07/2010: normal coronaries. Felt to have noncardiac CP/SOB at that time possibly r/t anxiety.   RA (rheumatoid arthritis) (HCC)    bilateral hands/ wrist--  seronegative   S/P TAVR (transcatheter aortic valve replacement) 03/27/2018   23 mm Oletta Lamas  Sapien 3 transcatheter heart valve placed via percutaneous right transfemoral approach    Severe aortic stenosis    Thyroid nodule    noted 11-2009   Wears dentures     Past Surgical History:  Procedure Laterality Date   CARDIAC CATHETERIZATION  07-19-2010  dr Tressia Miners turner   normal coronary arteries,  ef 60%,  moderate aortic stenosis- gradient 57mmHg, normal right heart pressure   CARDIOVASCULAR STRESS TEST  05/ 2011   dr Marlou Porch   normal low risk perfusion study   CATARACT EXTRACTION W/ INTRAOCULAR LENS  IMPLANT, BILATERAL  2013   CATARACT EXTRACTION, BILATERAL      CHOLECYSTECTOMY N/A 10/11/2018   Procedure: LAPAROSCOPIC CHOLECYSTECTOMY WITH INTRAOPERATIVE CHOLANGIOGRAM;  Surgeon: Jovita Kussmaul, MD;  Location: [facility] OR;  Service: General;  Laterality: N/A;   COLONOSCOPY  2010  approx   ERCP N/A 09/25/2018   Procedure: ENDOSCOPIC RETROGRADE CHOLANGIOPANCREATOGRAPHY (ERCP);  Surgeon: Clarene Essex, MD;  Location: [place];  Service: Endoscopy;  Laterality: N/A;   INGUINAL HERNIA REPAIR Right 08/23/2018   Procedure: OPEN RIGHT INGUINAL HERNIA REPAIR WITH MESH;  Surgeon: Armandina Gemma, MD;  Location: [facility];  Service: General;  Laterality: Right;   INTRAOPERATIVE TRANSTHORACIC ECHOCARDIOGRAM N/A 03/27/2018   Procedure: INTRAOPERATIVE TRANSTHORACIC ECHOCARDIOGRAM;  Surgeon: Burnell Blanks, MD;  Location: [place];  Service: Open Heart Surgery;  Laterality: N/A;   REMOVAL OF STONES  09/25/2018   Procedure: REMOVAL OF STONES;  Surgeon: Clarene Essex, MD;  Location: [place];  Service: Endoscopy;;   RIGHT/LEFT HEART CATH AND CORONARY ANGIOGRAPHY N/A 02/06/2018   Procedure: RIGHT/LEFT HEART CATH AND CORONARY ANGIOGRAPHY;  Surgeon: Burnell Blanks, MD;  Location: [place] [facility];  Service: Cardiovascular;  Laterality: N/A;   SPHINCTEROTOMY  09/25/2018   Procedure: SPHINCTEROTOMY;  Surgeon: Clarene Essex, MD;  Location: [place];  Service: Endoscopy;;   THYROID LOBECTOMY Right 05/05/2015   Procedure: RIGHT THYROID LOBECTOMY;  Surgeon: Armandina Gemma, MD;  Location: [place];  Service: General;  Laterality: Right;   TRANSANAL HEMORRHOIDAL DEARTERIALIZATION N/A 04/09/2014   Procedure: TRANSANAL HEMORRHOIDAL DEARTERIALIZATION OF INTERNAL HEMORROIDS;  Surgeon: Leighton Ruff, MD;  Location[facility];  Service: General;  Laterality: N/A;   TRANSCATHETER AORTIC VALVE REPLACEMENT, TRANSFEMORAL  03/27/2018   TRANSCATHETER AORTIC VALVE REPLACEMENT, TRANSFEMORAL N/A 03/27/2018   Procedure: TRANSCATHETER AORTIC VALVE REPLACEMENT,  TRANSFEMORAL. Edwards SAPIEN 3 Transcatheter Heart Valve 40mm.;  Surgeon: Burnell Blanks, MD;  Location: [place];  Service: Open Heart Surgery;  Laterality: N/A;   TRANSTHORACIC ECHOCARDIOGRAM  11-26-2013   moderate focal basal and mild LVH/  ef 123456  grade I diastolic dysfunction/ mild LAE/  moderate calcification with stenosis AV with mild regurg,  gradients 35 abd 58 mmHg /  mild TR    Current Medications: Outpatient Medications Prior to Visit  Medication Sig Dispense Refill   Ascorbic Acid (VITAMIN C) 1000 MG tablet Take 1,000 mg by mouth daily after lunch.     aspirin 81 MG tablet Take 1 tablet (81 mg total) by mouth daily. (Patient taking differently: Take 81 mg by mouth daily. ) 30 tablet    cholecalciferol (VITAMIN D3) 25 MCG (1000 UT) tablet Take 1,000 Units by mouth daily.     dutasteride (AVODART) 0.5 MG capsule Take 0.5 mg by mouth daily.     escitalopram (LEXAPRO) 20 MG tablet Take 1 tablet (20 mg total) by mouth daily. (Patient taking differently: Take 20 mg by mouth at bedtime. ) 30 tablet 4  folic acid (FOLVITE) 1 MG tablet Take 1 mg by mouth every evening.   0   furosemide (LASIX) 20 MG tablet Take 20 mg by mouth.     losartan (COZAAR) 25 MG tablet Take 25 mg by mouth daily.   3   Melatonin 5 MG TABS Take 5 mg by mouth at bedtime as needed (for sleep).      Omega-3 Fatty Acids (FISH OIL) 1000 MG CAPS Take 1,000 mg by mouth daily.     pantoprazole (PROTONIX) 40 MG tablet Take 1 tablet (40 mg total) by mouth daily. 30 tablet 0   vitamin E 400 UNIT capsule Take 400 Units by mouth daily after lunch.     No facility-administered medications prior to visit.      Allergies:   Ativan [lorazepam], Phenergan [promethazine hcl], and Eggs or egg-derived products   Social History   Socioeconomic History   Marital status: Widowed    Spouse name: Not on file   Number of children: Not on file   Years of education: Not on file   Highest education level:  Not on file  Occupational History   Not on file  Social Needs   Financial resource strain: Not on file   Food insecurity    Worry: Not on file    Inability: Not on file   Transportation needs    Medical: Not on file    Non-medical: Not on file  Tobacco Use   Smoking status: Former Smoker    Years: 40.00    Types: Cigarettes    Start date: 06/13/1973    Quit date: 04/03/1981    Years since quitting: 38.0   Smokeless tobacco: Never Used  Substance and Sexual Activity   Alcohol use: Yes    Comment: occasional   Drug use: No   Sexual activity: Not on file  Lifestyle   Physical activity    Days per week: Not on file    Minutes per session: Not on file   Stress: Not on file  Relationships   Social connections    Talks on phone: Not on file    Gets together: Not on file    Attends religious service: Not on file    Active member of club or organization: Not on file    Attends meetings of clubs or organizations: Not on file    Relationship status: Not on file  Other Topics Concern   Not on file  Social History Narrative   Not on file     Family History:  The patient's family history includes Breast cancer in his sister; CVA in his father; Diabetes in his brother and brother; Pulmonary embolism (age of onset: 33) in his mother.      ROS:   Please see the history of present illness.    ROS All other systems reviewed and are negative.   PHYSICAL EXAM:   VS:  BP 140/62    Pulse 60    GEN: Well nourished, well developed, in no acute distress HEENT: normal Neck: no JVD or masses Cardiac: RRR; 2/6 systolic murmurs. No rubs, or gallops,no edema  Respiratory:  clear to auscultation bilaterally, normal work of breathing GI: soft, nontender, nondistended, + BS MS: no deformity or atrophy Skin: warm and dry, no rash Neuro:  Alert and Oriented x 3, Strength and sensation are intact Psych: euthymic mood, full affect   Wt Readings from Last 3 Encounters:    10/11/18 160 lb 7.9 oz (72.8 kg)  09/26/18 165 lb 9.1 oz (75.1 kg)  08/23/18 170 lb 6 oz (77.3 kg)      Studies/Labs Reviewed:   EKG:  EKG is NOT ordered today.    Recent Labs: 09/24/2018: Magnesium 2.0 10/09/2018: ALT 20; B Natriuretic Peptide 52.5 10/10/2018: BUN 11; Creatinine, Ser 1.01; Hemoglobin 11.6; Platelets 187; Potassium 4.0; Sodium 135   Lipid Panel    Component Value Date/Time   CHOL 169 12/05/2013 0808   TRIG 136.0 12/05/2013 0808   HDL 51.80 12/05/2013 0808   CHOLHDL 3 12/05/2013 0808   VLDL 27.2 12/05/2013 0808   LDLCALC 90 12/05/2013 0808    Additional studies/ records that were reviewed today include:  TAVR OPERATIVE NOTE   Date of Procedure:03/27/2018  Preoperative Diagnosis:Severe Aortic Stenosis  Procedure:   Transcatheter Aortic Valve Replacement - PercutaneousRightTransfemoral Approach Edwards Sapien 3 THV (size 53mm, model # 9600TFX, serial # [id number])  Co-Surgeons:Clarence H. Roxy Manns, MD and Lauree Chandler, MD   Pre-operative Echo Findings: ? Severe aortic stenosis ? Normalleft ventricular systolic function  Post-operative Echo Findings: ? Mildparavalvular leak ? Normalleft ventricular systolic function  ______________  Echo 04/25/18 (1 month s/p TAVR)  Study Conclusions  - Left ventricle: The cavity size was normal. There was mild   concentric hypertrophy. Systolic function was normal. The   estimated ejection fraction was in the range of 60% to 65%. Wall   motion was normal; there were no regional wall motion   abnormalities. Doppler parameters are consistent with abnormal   left ventricular relaxation (grade 1 diastolic dysfunction). - Aortic valve: S/P TAVR. A prosthesis was present and functioning   normally. The prosthesis had a normal range of motion. The sewing   ring appeared normal, had no rocking motion, and showed no   evidence of  dehiscence. There was moderate perivalvular   regurgitation. Mean gradient (S): 14 mm Hg. Regurgitation   pressure half-time: 386 ms. - Left atrium: The atrium w as mildly dilated. - Atrial septum: No defect or patent foramen ovale was identified. - Pulmonary arteries: PA peak pressure: 32 mm Hg (S). Impressions: - S/P TAVR. On this study, there is moderate aortic regurgitation   that appears most likel perivalvular in nature. It was not seen   on prior study. Mean gradient is similar to prior.  __________________  Echo 04/25/19 IMPRESSIONS  1. Left ventricular ejection fraction, by visual estimation, is 60 to 65%. The left ventricle has normal function. There is no left ventricular hypertrophy.  2. Left ventricular diastolic parameters are consistent with Grade I diastolic dysfunction (impaired relaxation).  3. Elevated left ventricular end-diastolic pressure.  4. Global right ventricle has normal systolic function.The right ventricular size is normal. No increase in right ventricular wall thickness.  5. Left atrial size was normal.  6. Right atrial size was normal.  7. Moderate mitral annular calcification. No evidence of mitral valve regurgitation. No evidence of mitral stenosis.  8. The tricuspid valve is normal in structure. Tricuspid valve regurgitation is trivial.  9. 57mm Edwards Sapien bioprosthetic, stented aortic valve (TAVR) valve is present in the aortic position. Mild perivalvular Aortic valve regurgitation is visualized. Aortic valve mean gradient measures 18 mmHg. Aortic valve peak gradient measures 29.4  mmHg. Dimensionless index 0.61. 10. The pulmonic valve was normal in structure. Pulmonic valve regurgitation is trivial. 11. Mildly elevated pulmonary artery systolic pressure. 12. The inferior vena cava is normal in size with greater than 50% respiratory variability, suggesting right atrial pressure of 3 mmHg.   ASSESSMENT &  PLAN:   Severe AS s/p TAVR: doing well  with NYHA class I symptoms. Echo today shows EF 60%, normally functioning TAVR with mean gradient 18 mm Hg and mild PVL. SBE prophylaxis discussed; I have RX'd amoxicillin. Continue on aspirin 81mg  daily.   Chronic diastolic CHF: appears euvolemic. Continue lasix 20mg  daily.   HTN: BP borderline today. No changes made.   HLD: continue statin   Medication Adjustments/Labs and Tests Ordered: Current medicines are reviewed at length with the patient today.  Concerns regarding medicines are outlined above.  Medication changes, Labs and Tests ordered today are listed in the Patient Instructions below. Patient Instructions  Medication Instructions:  1) Your provider discussed the importance of taking an antibiotic prior to all dental visits to prevent damage to the heart valves from infection. You were given a prescription for AMOXIL 2,000mg  to take one hour prior to any dental appointment.    Follow-Up: Your provider wants you to follow-up in: 1 year with Dr. Marlou Porch. You will receive a reminder letter in the mail two months in advance. If you don't receive a letter, please call our office to schedule the follow-up appointment.      Signed, Angelena Form, PA-C  04/25/2019 3:29 PM    [facility] [address], [place], [place]  [address] Phone: [phone]; Fax: [phone]

## 2019-04-25 ENCOUNTER — Other Ambulatory Visit: Payer: Self-pay

## 2019-04-25 ENCOUNTER — Other Ambulatory Visit (HOSPITAL_COMMUNITY): Payer: Medicare Other

## 2019-04-25 ENCOUNTER — Encounter (INDEPENDENT_AMBULATORY_CARE_PROVIDER_SITE_OTHER): Payer: Self-pay

## 2019-04-25 ENCOUNTER — Ambulatory Visit (INDEPENDENT_AMBULATORY_CARE_PROVIDER_SITE_OTHER): Payer: 59 | Admitting: Physician Assistant

## 2019-04-25 ENCOUNTER — Ambulatory Visit (HOSPITAL_COMMUNITY): Payer: Medicare Other | Attending: Physician Assistant

## 2019-04-25 VITALS — BP 140/62 | HR 60

## 2019-04-25 DIAGNOSIS — Z952 Presence of prosthetic heart valve: Secondary | ICD-10-CM | POA: Insufficient documentation

## 2019-04-25 DIAGNOSIS — I5032 Chronic diastolic (congestive) heart failure: Secondary | ICD-10-CM | POA: Diagnosis not present

## 2019-04-25 DIAGNOSIS — I10 Essential (primary) hypertension: Secondary | ICD-10-CM | POA: Diagnosis not present

## 2019-04-25 DIAGNOSIS — E785 Hyperlipidemia, unspecified: Secondary | ICD-10-CM | POA: Diagnosis not present

## 2019-04-25 MED ORDER — AMOXICILLIN 500 MG PO TABS: ORAL_TABLET | ORAL | 8 refills | Status: DC

## 2019-04-25 NOTE — Patient Instructions (Addendum)
Medication Instructions:  1) Your provider discussed the importance of taking an antibiotic prior to all dental visits to prevent damage to the heart valves from infection. You were given a prescription for AMOXIL 2,000mg  to take one hour prior to any dental appointment.    Follow-Up: Your provider wants you to follow-up in: 1 year with Dr. Marlou Porch. You will receive a reminder letter in the mail two months in advance. If you don't receive a letter, please call our office to schedule the follow-up appointment.

## 2019-04-26 ENCOUNTER — Other Ambulatory Visit (HOSPITAL_COMMUNITY): Payer: Medicare Other

## 2019-10-04 ENCOUNTER — Encounter (HOSPITAL_COMMUNITY): Payer: Self-pay | Admitting: Emergency Medicine

## 2019-10-04 ENCOUNTER — Observation Stay (HOSPITAL_COMMUNITY)
Admission: EM | Admit: 2019-10-04 | Discharge: 2019-10-05 | Disposition: A | Discharge status: Home / Self Care | Payer: Medicare Other | Attending: Internal Medicine | Admitting: Internal Medicine

## 2019-10-04 ENCOUNTER — Other Ambulatory Visit: Payer: Self-pay

## 2019-10-04 ENCOUNTER — Emergency Department (HOSPITAL_COMMUNITY): Payer: Medicare Other

## 2019-10-04 DIAGNOSIS — Z20822 Contact with and (suspected) exposure to covid-19: Secondary | ICD-10-CM | POA: Diagnosis not present

## 2019-10-04 DIAGNOSIS — M069 Rheumatoid arthritis, unspecified: Secondary | ICD-10-CM | POA: Diagnosis present

## 2019-10-04 DIAGNOSIS — K219 Gastro-esophageal reflux disease without esophagitis: Secondary | ICD-10-CM | POA: Insufficient documentation

## 2019-10-04 DIAGNOSIS — M2578 Osteophyte, vertebrae: Secondary | ICD-10-CM | POA: Insufficient documentation

## 2019-10-04 DIAGNOSIS — R001 Bradycardia, unspecified: Secondary | ICD-10-CM | POA: Diagnosis not present

## 2019-10-04 DIAGNOSIS — M0600 Rheumatoid arthritis without rheumatoid factor, unspecified site: Secondary | ICD-10-CM

## 2019-10-04 DIAGNOSIS — I5032 Chronic diastolic (congestive) heart failure: Secondary | ICD-10-CM | POA: Diagnosis not present

## 2019-10-04 DIAGNOSIS — Z87891 Personal history of nicotine dependence: Secondary | ICD-10-CM | POA: Insufficient documentation

## 2019-10-04 DIAGNOSIS — Z8673 Personal history of transient ischemic attack (TIA), and cerebral infarction without residual deficits: Secondary | ICD-10-CM | POA: Insufficient documentation

## 2019-10-04 DIAGNOSIS — I351 Nonrheumatic aortic (valve) insufficiency: Secondary | ICD-10-CM | POA: Diagnosis not present

## 2019-10-04 DIAGNOSIS — F32A Depression, unspecified: Secondary | ICD-10-CM | POA: Diagnosis present

## 2019-10-04 DIAGNOSIS — N400 Enlarged prostate without lower urinary tract symptoms: Secondary | ICD-10-CM | POA: Diagnosis present

## 2019-10-04 DIAGNOSIS — E785 Hyperlipidemia, unspecified: Secondary | ICD-10-CM | POA: Insufficient documentation

## 2019-10-04 DIAGNOSIS — R55 Syncope and collapse: Principal | ICD-10-CM | POA: Diagnosis present

## 2019-10-04 DIAGNOSIS — I10 Essential (primary) hypertension: Secondary | ICD-10-CM | POA: Diagnosis present

## 2019-10-04 DIAGNOSIS — Z87442 Personal history of urinary calculi: Secondary | ICD-10-CM | POA: Insufficient documentation

## 2019-10-04 DIAGNOSIS — Z952 Presence of prosthetic heart valve: Secondary | ICD-10-CM | POA: Diagnosis not present

## 2019-10-04 DIAGNOSIS — N1831 Chronic kidney disease, stage 3a: Secondary | ICD-10-CM | POA: Insufficient documentation

## 2019-10-04 DIAGNOSIS — I130 Hypertensive heart and chronic kidney disease with heart failure and stage 1 through stage 4 chronic kidney disease, or unspecified chronic kidney disease: Secondary | ICD-10-CM | POA: Insufficient documentation

## 2019-10-04 DIAGNOSIS — Z7982 Long term (current) use of aspirin: Secondary | ICD-10-CM | POA: Diagnosis not present

## 2019-10-04 DIAGNOSIS — F329 Major depressive disorder, single episode, unspecified: Secondary | ICD-10-CM | POA: Diagnosis not present

## 2019-10-04 DIAGNOSIS — Z79899 Other long term (current) drug therapy: Secondary | ICD-10-CM | POA: Diagnosis not present

## 2019-10-04 DIAGNOSIS — I700 Atherosclerosis of aorta: Secondary | ICD-10-CM | POA: Diagnosis not present

## 2019-10-04 LAB — COMPREHENSIVE METABOLIC PANEL
ALT: 27 U/L (ref 0–44)
AST: 31 U/L (ref 15–41)
Albumin: 4 g/dL (ref 3.5–5.0)
Alkaline Phosphatase: 59 U/L (ref 38–126)
Anion gap: 13 (ref 5–15)
BUN: 20 mg/dL (ref 8–23)
CO2: 22 mmol/L (ref 22–32)
Calcium: 9.1 mg/dL (ref 8.9–10.3)
Chloride: 105 mmol/L (ref 98–111)
Creatinine, Ser: 1.21 mg/dL (ref 0.61–1.24)
GFR calc Af Amer: >60 mL/min (ref >60)
GFR calc non Af Amer: 53 mL/min — ABNORMAL LOW (ref >60)
Glucose, Bld: 148 mg/dL — ABNORMAL HIGH (ref 70–99)
Potassium: 3.8 mmol/L (ref 3.5–5.1)
Sodium: 140 mmol/L (ref 135–145)
Total Bilirubin: 1 mg/dL (ref 0.3–1.2)
Total Protein: 6.9 g/dL (ref 6.5–8.1)

## 2019-10-04 LAB — CBC WITH DIFFERENTIAL/PLATELET
Abs Immature Granulocytes: 0.01 10*3/uL (ref 0.00–0.07)
Basophils Absolute: 0 10*3/uL (ref 0.0–0.1)
Basophils Relative: 1 %
Eosinophils Absolute: 0.2 10*3/uL (ref 0.0–0.5)
Eosinophils Relative: 4 %
HCT: 43.1 % (ref 39.0–52.0)
Hemoglobin: 13.6 g/dL (ref 13.0–17.0)
Immature Granulocytes: 0 %
Lymphocytes Relative: 24 %
Lymphs Abs: 1.5 10*3/uL (ref 0.7–4.0)
MCH: 28.4 pg (ref 26.0–34.0)
MCHC: 31.6 g/dL (ref 30.0–36.0)
MCV: 90 fL (ref 80.0–100.0)
Monocytes Absolute: 0.4 10*3/uL (ref 0.1–1.0)
Monocytes Relative: 7 %
Neutro Abs: 4 10*3/uL (ref 1.7–7.7)
Neutrophils Relative %: 64 %
Platelets: 139 10*3/uL — ABNORMAL LOW (ref 150–400)
RBC: 4.79 MIL/uL (ref 4.22–5.81)
RDW: 11.9 % (ref 11.5–15.5)
WBC: 6.2 10*3/uL (ref 4.0–10.5)
nRBC: 0 % (ref 0.0–0.2)

## 2019-10-04 LAB — I-STAT CHEM 8, ED
BUN: 22 mg/dL (ref 8–23)
Calcium, Ion: 1.2 mmol/L (ref 1.15–1.40)
Chloride: 103 mmol/L (ref 98–111)
Creatinine, Ser: 1.3 mg/dL — ABNORMAL HIGH (ref 0.61–1.24)
Glucose, Bld: 126 mg/dL — ABNORMAL HIGH (ref 70–99)
HCT: 43 % (ref 39.0–52.0)
Hemoglobin: 14.6 g/dL (ref 13.0–17.0)
Potassium: 3.8 mmol/L (ref 3.5–5.1)
Sodium: 141 mmol/L (ref 135–145)
TCO2: 22 mmol/L (ref 22–32)

## 2019-10-04 LAB — ETHANOL: Alcohol, Ethyl (B): 33 mg/dL — ABNORMAL HIGH (ref <10)

## 2019-10-04 LAB — BRAIN NATRIURETIC PEPTIDE: B Natriuretic Peptide: 48.9 pg/mL (ref 0.0–100.0)

## 2019-10-04 LAB — PROTIME-INR
INR: 1 (ref 0.8–1.2)
Prothrombin Time: 13.4 seconds (ref 11.4–15.2)

## 2019-10-04 LAB — TROPONIN I (HIGH SENSITIVITY): Troponin I (High Sensitivity): 5 ng/L (ref <18)

## 2019-10-04 MED ORDER — ENOXAPARIN SODIUM 40 MG/0.4ML ~~LOC~~ SOLN
40.0000 mg | Freq: Every day | SUBCUTANEOUS | Status: DC
  Administered 2019-10-05: 40 mg via SUBCUTANEOUS
  Filled 2019-10-04: qty 0.4

## 2019-10-04 NOTE — H&P (Signed)
History and Physical    [name] [id number] DOB: 1928-03-21 DOA: 10/04/2019  PCP: Ryan Manes, MD  Patient coming from: Home  I have personally briefly reviewed patient's old medical records in [place]  Chief Complaint: syncope  HPI: [name] is a 86 y.o. male with medical history significant for chronic diastolic heart failure, hypertension, s/p TAVR, rheumatoid arthritis on Plaquenil, CKD stage III who presents with concerns of syncope. [place] interpreter [name] [phone]) assisted with history.  Patient reports that he was at the table with other family members this evening and had a glass of wine.  And as soon as he got up he had loss of consciousness and fell down on his back.  Unsure how long he was down but awoke on EMS arrival.  Upon waking, he felt weak and vomited.  He denies any loss of bowel or urinary incontinence.  Did not feel any dizziness lightheadedness, chest pain, palpitation or shortness of breath prior to episode.  Notes he does have a new dry cough today.  States he was otherwise in his normal state of health.  Denies any previous history of syncope.  He reports that he normally drinks once or twice a week of about a glass of wine.  Denies any recent changes to his medication.  Denies any tobacco or illicit drug use.  ED Course:  Patient initially presented as a code STEMI but this was canceled by cardiology after his EKG showed no signs of ST elevation.  He was normotensive with heart rate in the 50s to 60s on room air. No leukocytosis or anemia.  Platelet of 139.  No other electrolytes derangement other than glucose of 148.  Alcohol level 33.  CT head/CT cervical spine negative.  Chest x-ray shows likely atelectasis.   Review of Systems: Constitutional: No Weight Change, No Fever ENT/Mouth: No sore throat, No Rhinorrhea Eyes: No Eye Pain, No Vision Changes Cardiovascular: No Chest Pain, no SOB, No Palpitations Respiratory: + Cough, No  Sputum,  Gastrointestinal: No Nausea, + Vomiting, No Diarrhea, No Constipation, No Pain Genitourinary: no Urinary Incontinence, Musculoskeletal: No Arthralgias, No Myalgias Skin: No Skin Lesions, No Pruritus, Neuro:+ Weakness, No Numbness, + Loss of Consciousness, No Syncope Psych: No Anxiety/Panic, No Depression, no decrease appetite Heme/Lymph: No Bruising, No Bleeding  Past Medical History:  Diagnosis Date  . BPH (benign prostatic hypertrophy)   . Depression   . Diverticulosis of colon   . GERD (gastroesophageal reflux disease)   . Gilbert's syndrome   . History of kidney stones   . HLD (hyperlipidemia)   . HOH (hard of hearing)    refuses to wear his Hearing Aid  . Hypertension   . Inguinal hernia    right  . Internal hemorrhoid   . Normal coronary arteries    a. Cath 07/2010: normal coronaries. Felt to have noncardiac CP/SOB at that time possibly r/t anxiety.  . RA (rheumatoid arthritis) ([place])    bilateral hands/ wrist--  seronegative  . S/P TAVR (transcatheter aortic valve replacement) 03/27/2018   23 mm Edwards Sapien 3 transcatheter heart valve placed via percutaneous right transfemoral approach   . Severe aortic stenosis   . Thyroid nodule    noted 11-2009  . Wears dentures     Past Surgical History:  Procedure Laterality Date  . CARDIAC CATHETERIZATION  07-19-2010  dr Tressia Miners turner   normal coronary arteries,  ef 60%,  moderate aortic stenosis- gradient 67mmHg, normal right heart pressure  . CARDIOVASCULAR STRESS  TEST  05/ 2011   dr Marlou Porch   normal low risk perfusion study  . CATARACT EXTRACTION W/ INTRAOCULAR LENS  IMPLANT, BILATERAL  2013  . CATARACT EXTRACTION, BILATERAL    . CHOLECYSTECTOMY N/A 10/11/2018   Procedure: LAPAROSCOPIC CHOLECYSTECTOMY WITH INTRAOPERATIVE CHOLANGIOGRAM;  Surgeon: Jovita Kussmaul, MD;  Location: [facility];  Service: General;  Laterality: N/A;  . COLONOSCOPY  2010  approx  . ERCP N/A 09/25/2018   Procedure: ENDOSCOPIC RETROGRADE  CHOLANGIOPANCREATOGRAPHY (ERCP);  Surgeon: Clarene Essex, MD;  Location: [facility];  Service: Endoscopy;  Laterality: N/A;  . INGUINAL HERNIA REPAIR Right 08/23/2018   Procedure: OPEN RIGHT INGUINAL HERNIA REPAIR WITH MESH;  Surgeon: Armandina Gemma, MD;  Location: [facility];  Service: General;  Laterality: Right;  . INTRAOPERATIVE TRANSTHORACIC ECHOCARDIOGRAM N/A 03/27/2018   Procedure: INTRAOPERATIVE TRANSTHORACIC ECHOCARDIOGRAM;  Surgeon: Burnell Blanks, MD;  Location: [facility];  Service: Open Heart Surgery;  Laterality: N/A;  . REMOVAL OF STONES  09/25/2018   Procedure: REMOVAL OF STONES;  Surgeon: Clarene Essex, MD;  Location: [facility];  Service: Endoscopy;;  . RIGHT/LEFT HEART CATH AND CORONARY ANGIOGRAPHY N/A 02/06/2018   Procedure: RIGHT/LEFT HEART CATH AND CORONARY ANGIOGRAPHY;  Surgeon: Burnell Blanks, MD;  Location: [facility];  Service: Cardiovascular;  Laterality: N/A;  . SPHINCTEROTOMY  09/25/2018   Procedure: SPHINCTEROTOMY;  Surgeon: Clarene Essex, MD;  Location: [facility];  Service: Endoscopy;;  . THYROID LOBECTOMY Right 05/05/2015   Procedure: RIGHT THYROID LOBECTOMY;  Surgeon: Armandina Gemma, MD;  Location: [facility];  Service: General;  Laterality: Right;  . TRANSANAL HEMORRHOIDAL DEARTERIALIZATION N/A 04/09/2014   Procedure: TRANSANAL HEMORRHOIDAL DEARTERIALIZATION OF INTERNAL HEMORROIDS;  Surgeon: Leighton Ruff, MD;  Location: [facility];  Service: General;  Laterality: N/A;  . TRANSCATHETER AORTIC VALVE REPLACEMENT, TRANSFEMORAL  03/27/2018  . TRANSCATHETER AORTIC VALVE REPLACEMENT, TRANSFEMORAL N/A 03/27/2018   Procedure: TRANSCATHETER AORTIC VALVE REPLACEMENT, TRANSFEMORAL. Edwards SAPIEN 3 Transcatheter Heart Valve 69mm.;  Surgeon: Burnell Blanks, MD;  Location: [facility];  Service: Open Heart Surgery;  Laterality: N/A;  . TRANSTHORACIC ECHOCARDIOGRAM  11-26-2013   moderate focal basal and mild LVH/  ef [id number]  grade I diastolic  dysfunction/ mild LAE/  moderate calcification with stenosis AV with mild regurg,  gradients 35 abd 58 mmHg /  mild TR     reports that he quit smoking about 38 years ago. His smoking use included cigarettes. He started smoking about 46 years ago. He quit after 40.00 years of use. He has never used smokeless tobacco. He reports current alcohol use. He reports that he does not use drugs.  Allergies  Allergen Reactions  . Ativan [Lorazepam] Other (See Comments)    hallucinations  . Phenergan [Promethazine Hcl] Hypertension  . Eggs Or Egg-Derived Products Rash    Small rash after eating for several days in a row    Family History  Problem Relation Age of Onset  . Pulmonary embolism Mother 74       caused by complications of surgery  . CVA Father   . Diabetes Brother   . Diabetes Brother   . Breast cancer Sister      Prior to Admission medications   Medication Sig Start Date End Date Taking? Authorizing Provider  Ascorbic Acid (VITAMIN C) 1000 MG tablet Take 1,000 mg by mouth daily after lunch.   Yes [provider]  aspirin 81 MG tablet Take 1 tablet (81 mg total) by mouth daily. Patient taking differently: Take  81 mg by mouth daily.  06/05/18  Yes Barrett, Evelene Croon, PA-C  cholecalciferol (VITAMIN D3) 25 MCG (1000 UT) tablet Take 1,000 Units by mouth daily.   Yes [provider]  dutasteride (AVODART) 0.5 MG capsule Take 0.5 mg by mouth daily.   Yes [provider]  escitalopram (LEXAPRO) 20 MG tablet Take 1 tablet (20 mg total) by mouth daily. Patient taking differently: Take 20 mg by mouth at bedtime.  09/06/16  Yes Angiulli, Lavon Paganini, PA-C  hydroxychloroquine (PLAQUENIL) 200 MG tablet Take 400 mg by mouth daily. 09/19/19  Yes [provider]  losartan (COZAAR) 25 MG tablet Take 25 mg by mouth daily.  01/21/18  Yes [provider]  Melatonin 5 MG TABS Take 5 mg by mouth at bedtime as needed (for sleep).    Yes [provider]    Omega-3 Fatty Acids (FISH OIL) 1000 MG CAPS Take 1,000 mg by mouth daily.   Yes [provider]  pantoprazole (PROTONIX) 40 MG tablet Take 1 tablet (40 mg total) by mouth daily. 09/27/18  Yes Eulogio Bear U, DO  amoxicillin (AMOXIL) 500 MG tablet Take 4 tablets (2,000mg ) one hour prior to all dental visits. Patient not taking: Reported on 10/04/2019 04/25/19   Eileen Stanford, [place]    Physical Exam: Vitals:   10/04/19 2245 10/04/19 2300 10/04/19 2315 10/04/19 2330  BP: 139/63 (!) 150/69 (!) 138/58 132/62  Pulse: (!) 56 60 (!) 57 (!) 57  Resp: 20 (!) 23 (!) 21 18  SpO2: 97% 97% 97% 96%  Weight:      Height:        Constitutional: NAD, calm, comfortable, well-appearing elderly gentleman laying flat in bed Vitals:   10/04/19 2245 10/04/19 2300 10/04/19 2315 10/04/19 2330  BP: 139/63 (!) 150/69 (!) 138/58 132/62  Pulse: (!) 56 60 (!) 57 (!) 57  Resp: 20 (!) 23 (!) 21 18  SpO2: 97% 97% 97% 96%  Weight:      Height:       Eyes: PERRL, lids and conjunctivae normal ENMT: Mucous membranes are moist.  Neck: normal, supple,  Respiratory: Crackles to left lung base but no wheezing. Normal respiratory effort on room air. No accessory muscle use.  Cardiovascular: Regular rate and rhythm although bradycardic down to 50s at times on telemetry, no murmurs / rubs / gallops. No extremity edema. 2+ pedal pulses.  Abdomen: no tenderness, no masses palpated.  Bowel sounds positive.  Musculoskeletal: no clubbing / cyanosis. No joint deformity upper and lower extremities. Good ROM, no contractures. Normal muscle tone.  Skin: no rashes, lesions, ulcers. No induration Neurologic: CN 2-12 grossly intact. Sensation intact. Strength 5/5 in all 4.  Psychiatric: Normal judgment and insight. Alert and oriented x 3. Normal mood.    Labs on Admission: I have personally reviewed following labs and imaging studies  CBC: Recent Labs  Lab 10/04/19 2133 10/04/19 2222  WBC 6.2  --   NEUTROABS  4.0  --   HGB 13.6 14.6  HCT 43.1 43.0  MCV 90.0  --   PLT 139*  --    Basic Metabolic Panel: Recent Labs  Lab 10/04/19 2133 10/04/19 2222  NA 140 141  K 3.8 3.8  CL 105 103  CO2 22  --   GLUCOSE 148* 126*  BUN 20 22  CREATININE 1.21 1.30*  CALCIUM 9.1  --    GFR: Estimated Creatinine Clearance: 37.3 mL/min (A) (by C-G formula based on SCr of 1.3 mg/dL (  H)). Liver Function Tests: Recent Labs  Lab 10/04/19 2133  AST 31  ALT 27  ALKPHOS 59  BILITOT 1.0  PROT 6.9  ALBUMIN 4.0   No results for input(s): LIPASE, AMYLASE in the last 168 hours. No results for input(s): AMMONIA in the last 168 hours. Coagulation Profile: Recent Labs  Lab 10/04/19 2133  INR 1.0   Cardiac Enzymes: No results for input(s): CKTOTAL, CKMB, CKMBINDEX, TROPONINI in the last 168 hours. BNP (last 3 results) No results for input(s): PROBNP in the last 8760 hours. HbA1C: No results for input(s): HGBA1C in the last 72 hours. CBG: No results for input(s): GLUCAP in the last 168 hours. Lipid Profile: No results for input(s): CHOL, HDL, LDLCALC, TRIG, CHOLHDL, LDLDIRECT in the last 72 hours. Thyroid Function Tests: No results for input(s): TSH, T4TOTAL, FREET4, T3FREE, THYROIDAB in the last 72 hours. Anemia Panel: No results for input(s): VITAMINB12, FOLATE, FERRITIN, TIBC, IRON, RETICCTPCT in the last 72 hours. Urine analysis:    Component Value Date/Time   COLORURINE YELLOW 10/09/2018 1736   APPEARANCEUR CLEAR 10/09/2018 1736   LABSPEC 1.012 10/09/2018 1736   PHURINE 6.0 10/09/2018 1736   GLUCOSEU NEGATIVE 10/09/2018 1736   HGBUR NEGATIVE 10/09/2018 1736   BILIRUBINUR NEGATIVE 10/09/2018 1736   KETONESUR NEGATIVE 10/09/2018 1736   PROTEINUR NEGATIVE 10/09/2018 1736   UROBILINOGEN 1.0 04/14/2014 0116   NITRITE NEGATIVE 10/09/2018 1736   LEUKOCYTESUR NEGATIVE 10/09/2018 1736    Radiological Exams on Admission: CT Head Wo Contrast  Result Date: 10/04/2019 CLINICAL DATA:  Fall,  question of seizure EXAM: CT HEAD WITHOUT CONTRAST TECHNIQUE: Contiguous axial images were obtained from the base of the skull through the vertex without intravenous contrast. COMPARISON:  None. FINDINGS: Brain: No evidence of acute territorial infarction, hemorrhage, hydrocephalus,extra-axial collection or mass lesion/mass effect. There is dilatation the ventricles and sulci consistent with age-related atrophy. Low-attenuation changes in the deep white matter consistent with small vessel ischemia. Prior lacunar infarct seen within the bilateral basal ganglia. Vascular: No hyperdense vessel or unexpected calcification. Skull: The skull is intact. No fracture or focal lesion identified. Sinuses/Orbits: The visualized paranasal sinuses and mastoid air cells are clear. The orbits and globes intact. Other: None Cervical spine: Alignment: Physiologic Skull base and vertebrae: Visualized skull base is intact. No atlanto-occipital dissociation. The vertebral body heights are well maintained. No fracture or pathologic osseous lesion seen. Soft tissues and spinal canal: The visualized paraspinal soft tissues are unremarkable. No prevertebral soft tissue swelling is seen. The spinal canal is grossly unremarkable, no large epidural collection or significant canal narrowing. Disc levels: Multilevel cervical spine spondylosis is seen with anterior osteophytes, disc osteophyte complex and uncovertebral osteophytes which is most notable at C4 through C7 with moderate to severe neural foraminal narrowing and mild central canal stenosis. There is scattered calcification along the anterior longitudinal ligament from C4 through C7. Upper chest: The lung apices are clear. Thoracic inlet is within normal limits. Other: None IMPRESSION: 1.  No acute intracranial abnormality. 2. Findings consistent with age related atrophy and chronic small vessel ischemia 3.  No acute fracture or malalignment of the spine. 4. Cervical spine spondylosis  most notable from C4 through C7. Electronically Signed   By: Prudencio Pair M.D.   On: 10/04/2019 22:19   CT CERVICAL SPINE WO CONTRAST  Result Date: 10/04/2019 CLINICAL DATA:  Fall, question of seizure EXAM: CT HEAD WITHOUT CONTRAST TECHNIQUE: Contiguous axial images were obtained from the base of the skull through the vertex without intravenous  contrast. COMPARISON:  None. FINDINGS: Brain: No evidence of acute territorial infarction, hemorrhage, hydrocephalus,extra-axial collection or mass lesion/mass effect. There is dilatation the ventricles and sulci consistent with age-related atrophy. Low-attenuation changes in the deep white matter consistent with small vessel ischemia. Prior lacunar infarct seen within the bilateral basal ganglia. Vascular: No hyperdense vessel or unexpected calcification. Skull: The skull is intact. No fracture or focal lesion identified. Sinuses/Orbits: The visualized paranasal sinuses and mastoid air cells are clear. The orbits and globes intact. Other: None Cervical spine: Alignment: Physiologic Skull base and vertebrae: Visualized skull base is intact. No atlanto-occipital dissociation. The vertebral body heights are well maintained. No fracture or pathologic osseous lesion seen. Soft tissues and spinal canal: The visualized paraspinal soft tissues are unremarkable. No prevertebral soft tissue swelling is seen. The spinal canal is grossly unremarkable, no large epidural collection or significant canal narrowing. Disc levels: Multilevel cervical spine spondylosis is seen with anterior osteophytes, disc osteophyte complex and uncovertebral osteophytes which is most notable at C4 through C7 with moderate to severe neural foraminal narrowing and mild central canal stenosis. There is scattered calcification along the anterior longitudinal ligament from C4 through C7. Upper chest: The lung apices are clear. Thoracic inlet is within normal limits. Other: None IMPRESSION: 1.  No acute  intracranial abnormality. 2. Findings consistent with age related atrophy and chronic small vessel ischemia 3.  No acute fracture or malalignment of the spine. 4. Cervical spine spondylosis most notable from C4 through C7. Electronically Signed   By: Prudencio Pair M.D.   On: 10/04/2019 22:19   DG Chest Port 1 View  Result Date: 10/04/2019 CLINICAL DATA:  Syncope EXAM: PORTABLE CHEST 1 VIEW COMPARISON:  Radiograph 09/22/2018, CT 06/04/2018 FINDINGS: Lung volumes are diminished with increased streaky opacities. Suspect some mild central vascular congestion as well with vascular crowding secondary to volume loss.No pneumothorax or effusion. Prior aortic valve stent graft replacement is noted in stable position accounting for differences in technique. Prominence of the cardiac silhouette likely related to portable imaging. The aorta is calcified. The remaining cardiomediastinal contours are unremarkable. No acute osseous or soft tissue abnormality. Degenerative changes are present in the imaged spine and shoulders. Surgical clips at the base of the right neck may reflect prior thyroidectomy/hemithyroidectomy. Telemetry leads overlie the chest. IMPRESSION: 1. Low lung volumes with increased streaky opacities, favor atelectasis though mild edema may be present as well with some central vascular congestion. Electronically Signed   By: Lovena Le M.D.   On: 10/04/2019 22:26    EKG: Independently reviewed.  Assessment/Plan  Syncope with loss of consciousness Suspect likely vasovagal and from alcohol use.  However given extensive cardiac history will admit for observation on telemetry. We will check orthostatic vital signs Check TSH Check echocardiogram given history of TAVR  Asymptomatic bradycardia Continue to monitor on telemetry  Chronic diastolic heart failure Appears euvolemic  Hypertension Stable continue Losartan  Rheumatoid arthritis Continue Plaquenil  CKD stage IIIa Stable Avoid  nephrotoxic agent  BPH  continue dutasteroid  Depression Continue SSRI  DVT prophylaxis:.Lovenox Code Status: Full Family Communication: Plan discussed with patient at bedside  disposition Plan: Home with observation Consults called:   Status is: Observation  The patient remains OBS appropriate and will d/c before 2 midnights.  Dispo: The patient is from: Home              Anticipated d/c is to: Home              Anticipated d/c  date is: 1 day              Patient currently is not medically stable to d/c.         Orene Desanctis DO [facility]   If 7PM-7AM, please contact night-coverage [email or web address]   10/04/2019, 11:55 PM

## 2019-10-04 NOTE — ED Triage Notes (Addendum)
Patient arrived with EMS from home , code STEMI activation , syncopal episode at home this evening with confusion and emesis , CBG= 134 , received ASA 324 mg by EMS prior to arrival . Code STEMI cancelled by cardiologist at arrival . Patient stated he drank wine this evening during encounter with EDP . Denies pain /respirations unlabored .

## 2019-10-04 NOTE — ED Provider Notes (Signed)
[facility] Emergency Department Provider Note MRN:  [id number]  Arrival date & time: 10/04/19     Chief Complaint   Syncope History of Present Illness   [name] is a 86 y.o. year-old male with a history of CHF, RA, TAVR presenting to the ED with chief complaint of syncope.  Patient reports feeling nauseated during dinner and then passed out.  Denies chest pain before or after the event.  Denies recent illness, denies shortness of breath, no leg pain or swelling.  Currently without pain or complaints.  History obtained with help of video interpreter, [place] translation.  Review of Systems  A complete 10 system review of systems was obtained and all systems are negative except as noted in the HPI and PMH.   Patient's Health History    Past Medical History:  Diagnosis Date  . BPH (benign prostatic hypertrophy)   . Depression   . Diverticulosis of colon   . GERD (gastroesophageal reflux disease)   . Gilbert's syndrome   . History of kidney stones   . HLD (hyperlipidemia)   . HOH (hard of hearing)    refuses to wear his Hearing Aid  . Hypertension   . Inguinal hernia    right  . Internal hemorrhoid   . Normal coronary arteries    a. Cath 07/2010: normal coronaries. Felt to have noncardiac CP/SOB at that time possibly r/t anxiety.  . RA (rheumatoid arthritis) ([place])    bilateral hands/ wrist--  seronegative  . S/P TAVR (transcatheter aortic valve replacement) 03/27/2018   23 mm Edwards Sapien 3 transcatheter heart valve placed via percutaneous right transfemoral approach   . Severe aortic stenosis   . Thyroid nodule    noted 11-2009  . Wears dentures     Past Surgical History:  Procedure Laterality Date  . CARDIAC CATHETERIZATION  07-19-2010  dr Tressia Miners turner   normal coronary arteries,  ef 60%,  moderate aortic stenosis- gradient 42mmHg, normal right heart pressure  . CARDIOVASCULAR STRESS TEST  05/ 2011   dr Marlou Porch   normal low risk  perfusion study  . CATARACT EXTRACTION W/ INTRAOCULAR LENS  IMPLANT, BILATERAL  2013  . CATARACT EXTRACTION, BILATERAL    . CHOLECYSTECTOMY N/A 10/11/2018   Procedure: LAPAROSCOPIC CHOLECYSTECTOMY WITH INTRAOPERATIVE CHOLANGIOGRAM;  Surgeon: Jovita Kussmaul, MD;  Location: [place];  Service: General;  Laterality: N/A;  . COLONOSCOPY  2010  approx  . ERCP N/A 09/25/2018   Procedure: ENDOSCOPIC RETROGRADE CHOLANGIOPANCREATOGRAPHY (ERCP);  Surgeon: Clarene Essex, MD;  Location: [place];  Service: Endoscopy;  Laterality: N/A;  . INGUINAL HERNIA REPAIR Right 08/23/2018   Procedure: OPEN RIGHT INGUINAL HERNIA REPAIR WITH MESH;  Surgeon: Armandina Gemma, MD;  Location: [facility];  Service: General;  Laterality: Right;  . INTRAOPERATIVE TRANSTHORACIC ECHOCARDIOGRAM N/A 03/27/2018   Procedure: INTRAOPERATIVE TRANSTHORACIC ECHOCARDIOGRAM;  Surgeon: Burnell Blanks, MD;  Location: [place];  Service: Open Heart Surgery;  Laterality: N/A;  . REMOVAL OF STONES  09/25/2018   Procedure: REMOVAL OF STONES;  Surgeon: Clarene Essex, MD;  Location: [place];  Service: Endoscopy;;  . RIGHT/LEFT HEART CATH AND CORONARY ANGIOGRAPHY N/A 02/06/2018   Procedure: RIGHT/LEFT HEART CATH AND CORONARY ANGIOGRAPHY;  Surgeon: Burnell Blanks, MD;  Location: [place] [facility];  Service: Cardiovascular;  Laterality: N/A;  . SPHINCTEROTOMY  09/25/2018   Procedure: SPHINCTEROTOMY;  Surgeon: Clarene Essex, MD;  Location: [place];  Service: Endoscopy;;  . THYROID LOBECTOMY Right 05/05/2015   Procedure: RIGHT THYROID  LOBECTOMY;  Surgeon: Armandina Gemma, MD;  Location: [place];  Service: General;  Laterality: Right;  . TRANSANAL HEMORRHOIDAL DEARTERIALIZATION N/A 04/09/2014   Procedure: TRANSANAL HEMORRHOIDAL DEARTERIALIZATION OF INTERNAL HEMORROIDS;  Surgeon: Leighton Ruff, MD;  Location: [facility];  Service: General;  Laterality: N/A;  . TRANSCATHETER AORTIC VALVE REPLACEMENT, TRANSFEMORAL  03/27/2018  .  TRANSCATHETER AORTIC VALVE REPLACEMENT, TRANSFEMORAL N/A 03/27/2018   Procedure: TRANSCATHETER AORTIC VALVE REPLACEMENT, TRANSFEMORAL. Edwards SAPIEN 3 Transcatheter Heart Valve 56mm.;  Surgeon: Burnell Blanks, MD;  Location: [place];  Service: Open Heart Surgery;  Laterality: N/A;  . TRANSTHORACIC ECHOCARDIOGRAM  11-26-2013   moderate focal basal and mild LVH/  ef [id number]  grade I diastolic dysfunction/ mild LAE/  moderate calcification with stenosis AV with mild regurg,  gradients 35 abd 58 mmHg /  mild TR    Family History  Problem Relation Age of Onset  . Pulmonary embolism Mother 91       caused by complications of surgery  . CVA Father   . Diabetes Brother   . Diabetes Brother   . Breast cancer Sister     Social History   Socioeconomic History  . Marital status: Widowed    Spouse name: Not on file  . Number of children: Not on file  . Years of education: Not on file  . Highest education level: Not on file  Occupational History  . Not on file  Tobacco Use  . Smoking status: Former Smoker    Years: 40.00    Types: Cigarettes    Start date: 06/13/1973    Quit date: 04/03/1981    Years since quitting: 38.5  . Smokeless tobacco: Never Used  Substance and Sexual Activity  . Alcohol use: Yes    Comment: occasional  . Drug use: No  . Sexual activity: Not on file  Other Topics Concern  . Not on file  Social History Narrative  . Not on file   Social Determinants of Health   Financial Resource Strain:   . Difficulty of Paying Living Expenses:   Food Insecurity:   . Worried About Charity fundraiser in the Last Year:   . Arboriculturist in the Last Year:   Transportation Needs:   . Film/video editor (Medical):   Marland Kitchen Lack of Transportation (Non-Medical):   Physical Activity:   . Days of Exercise per Week:   . Minutes of Exercise per Session:   Stress:   . Feeling of Stress :   Social Connections:   . Frequency of Communication with Friends and Family:   .  Frequency of Social Gatherings with Friends and Family:   . Attends Religious Services:   . Active Member of Clubs or Organizations:   . Attends Archivist Meetings:   Marland Kitchen Marital Status:   Intimate Partner Violence:   . Fear of Current or Ex-Partner:   . Emotionally Abused:   Marland Kitchen Physically Abused:   . Sexually Abused:      Physical Exam   Vitals:   10/04/19 2245 10/04/19 2300  BP: 139/63 (!) 150/69  Pulse: (!) 56 60  Resp: 20 (!) 23  SpO2: 97% 97%    CONSTITUTIONAL: Well-appearing, NAD, pants soiled with urine NEURO:  Alert and oriented x 3, no focal deficits EYES:  eyes equal and reactive ENT/NECK:  no LAD, no JVD CARDIO: Regular rate, well-perfused, normal S1 and S2 PULM:  CTAB no wheezing or rhonchi GI/GU:  normal bowel sounds,  non-distended, non-tender MSK/SPINE:  No gross deformities, no edema SKIN:  no rash, atraumatic PSYCH:  Appropriate speech and behavior  *Additional and/or pertinent findings included in MDM below  Diagnostic and Interventional Summary    EKG Interpretation  Date/Time:  Friday October 04 2019 21:32:35 EDT Ventricular Rate:  56 PR Interval:    QRS Duration: 105 QT Interval:  450 QTC Calculation: 435 R Axis:   -12 Text Interpretation: Sinus rhythm Minimal ST elevation, anterior leads Confirmed by Gerlene Fee [phone]) on 10/04/2019 9:43:31 PM      Labs Reviewed  CBC WITH DIFFERENTIAL/PLATELET - Abnormal; Notable for the following components:      Result Value   Platelets 139 (*)    All other components within normal limits  COMPREHENSIVE METABOLIC PANEL - Abnormal; Notable for the following components:   Glucose, Bld 148 (*)    GFR calc non Af Amer 53 (*)    All other components within normal limits  ETHANOL - Abnormal; Notable for the following components:   Alcohol, Ethyl (B) 33 (*)    All other components within normal limits  I-STAT CHEM 8, ED - Abnormal; Notable for the following components:   Creatinine, Ser 1.30 (*)      Glucose, Bld 126 (*)    All other components within normal limits  RESPIRATORY PANEL BY RT PCR (FLU A&B, COVID)  PROTIME-INR  BRAIN NATRIURETIC PEPTIDE  URINALYSIS, ROUTINE W REFLEX MICROSCOPIC  TROPONIN I (HIGH SENSITIVITY)  TROPONIN I (HIGH SENSITIVITY)    DG Chest Port 1 View  Final Result    CT Head Wo Contrast  Final Result    CT CERVICAL SPINE WO CONTRAST  Final Result      Medications - No data to display   Procedures  /  Critical Care Procedures  ED Course and Medical Decision Making  I have reviewed the triage vital signs, the nursing notes, and pertinent available records from the EMR.  Listed above are laboratory and imaging tests that I personally ordered, reviewed, and interpreted and then considered in my medical decision making (see below for details).      Code STEMI called in the field, however rhythm strips without any evidence of STEMI criteria per cardiology who is evaluating patient and rhythm strips on arrival.  12-lead reassuring.  Patient denies chest pain.  Mostly here for syncopal episode.  Urinary incontinence, no history of seizures, will CT head to eval for CNS bleed or lesion.  Patient has a history of CHF and given the syncopal episode may need admission for high risk syncope.  Discussed with patient's family, will admit to hospitalist service for telemetry and observation given his past medical history.  Work-up is reassuring.  Barth Kirks. [place] Small, [place] [email or web address]  Final Clinical Impressions(s) / ED Diagnoses     ICD-10-CM   1. Syncope, unspecified syncope type  R55     ED Discharge Orders    None       Discharge Instructions Discussed with and Provided to Patient:   Discharge Instructions   None       Maudie Flakes, MD 10/04/19 2341

## 2019-10-05 ENCOUNTER — Observation Stay (HOSPITAL_BASED_OUTPATIENT_CLINIC_OR_DEPARTMENT_OTHER): Payer: Medicare Other

## 2019-10-05 DIAGNOSIS — N400 Enlarged prostate without lower urinary tract symptoms: Secondary | ICD-10-CM | POA: Diagnosis not present

## 2019-10-05 DIAGNOSIS — I351 Nonrheumatic aortic (valve) insufficiency: Secondary | ICD-10-CM

## 2019-10-05 DIAGNOSIS — Z952 Presence of prosthetic heart valve: Secondary | ICD-10-CM | POA: Diagnosis not present

## 2019-10-05 DIAGNOSIS — R55 Syncope and collapse: Secondary | ICD-10-CM | POA: Diagnosis not present

## 2019-10-05 DIAGNOSIS — M0600 Rheumatoid arthritis without rheumatoid factor, unspecified site: Secondary | ICD-10-CM | POA: Diagnosis not present

## 2019-10-05 DIAGNOSIS — R001 Bradycardia, unspecified: Secondary | ICD-10-CM | POA: Diagnosis not present

## 2019-10-05 LAB — URINALYSIS, ROUTINE W REFLEX MICROSCOPIC
Bilirubin Urine: NEGATIVE
Glucose, UA: NEGATIVE mg/dL
Hgb urine dipstick: NEGATIVE
Ketones, ur: NEGATIVE mg/dL
Leukocytes,Ua: NEGATIVE
Nitrite: NEGATIVE
Protein, ur: NEGATIVE mg/dL
Specific Gravity, Urine: 1.019 (ref 1.005–1.030)
pH: 5 (ref 5.0–8.0)

## 2019-10-05 LAB — TSH: TSH: 3.25 u[IU]/mL (ref 0.350–4.500)

## 2019-10-05 LAB — ECHOCARDIOGRAM COMPLETE
Height: 68 in
Weight: 2645.52 oz

## 2019-10-05 LAB — TROPONIN I (HIGH SENSITIVITY): Troponin I (High Sensitivity): 6 ng/L (ref <18)

## 2019-10-05 LAB — CBC
HCT: 41.4 % (ref 39.0–52.0)
Hemoglobin: 13.3 g/dL (ref 13.0–17.0)
MCH: 28.6 pg (ref 26.0–34.0)
MCHC: 32.1 g/dL (ref 30.0–36.0)
MCV: 89 fL (ref 80.0–100.0)
Platelets: 129 10*3/uL — ABNORMAL LOW (ref 150–400)
RBC: 4.65 MIL/uL (ref 4.22–5.81)
RDW: 12 % (ref 11.5–15.5)
WBC: 6.7 10*3/uL (ref 4.0–10.5)
nRBC: 0 % (ref 0.0–0.2)

## 2019-10-05 LAB — BASIC METABOLIC PANEL
Anion gap: 9 (ref 5–15)
BUN: 22 mg/dL (ref 8–23)
CO2: 23 mmol/L (ref 22–32)
Calcium: 8.9 mg/dL (ref 8.9–10.3)
Chloride: 105 mmol/L (ref 98–111)
Creatinine, Ser: 1.09 mg/dL (ref 0.61–1.24)
GFR calc Af Amer: >60 mL/min (ref >60)
GFR calc non Af Amer: 60 mL/min — ABNORMAL LOW (ref >60)
Glucose, Bld: 118 mg/dL — ABNORMAL HIGH (ref 70–99)
Potassium: 4.1 mmol/L (ref 3.5–5.1)
Sodium: 137 mmol/L (ref 135–145)

## 2019-10-05 LAB — RESPIRATORY PANEL BY RT PCR (FLU A&B, COVID)
Influenza A by PCR: NEGATIVE
Influenza B by PCR: NEGATIVE
SARS Coronavirus 2 by RT PCR: NEGATIVE

## 2019-10-05 MED ORDER — LOSARTAN POTASSIUM 25 MG PO TABS
25.0000 mg | ORAL_TABLET | Freq: Every day | ORAL | Status: DC
  Administered 2019-10-05: 25 mg via ORAL
  Filled 2019-10-05: qty 1

## 2019-10-05 MED ORDER — PANTOPRAZOLE SODIUM 40 MG PO TBEC
40.0000 mg | DELAYED_RELEASE_TABLET | Freq: Every day | ORAL | Status: DC
  Administered 2019-10-05: 40 mg via ORAL
  Filled 2019-10-05: qty 1

## 2019-10-05 MED ORDER — HYDROXYCHLOROQUINE SULFATE 200 MG PO TABS
400.0000 mg | ORAL_TABLET | Freq: Every day | ORAL | Status: DC
  Administered 2019-10-05: 400 mg via ORAL
  Filled 2019-10-05 (×2): qty 2

## 2019-10-05 MED ORDER — ESCITALOPRAM OXALATE 20 MG PO TABS: 20.0000 mg | ORAL_TABLET | Freq: Every day | ORAL | Status: DC

## 2019-10-05 MED ORDER — MELATONIN 5 MG PO TABS
5.0000 mg | ORAL_TABLET | Freq: Every evening | ORAL | Status: DC | PRN
  Filled 2019-10-05: qty 1

## 2019-10-05 MED ORDER — DUTASTERIDE 0.5 MG PO CAPS
0.5000 mg | ORAL_CAPSULE | Freq: Every day | ORAL | Status: DC
  Administered 2019-10-05: 0.5 mg via ORAL
  Filled 2019-10-05 (×2): qty 1

## 2019-10-05 MED ORDER — ASPIRIN EC 81 MG PO TBEC
81.0000 mg | DELAYED_RELEASE_TABLET | Freq: Every day | ORAL | Status: DC
  Administered 2019-10-05: 81 mg via ORAL
  Filled 2019-10-05: qty 1

## 2019-10-05 NOTE — Evaluation (Signed)
Physical Therapy Evaluation Patient Details Name: [name] MRN: [id number] DOB: 1927/07/04 Today's Date: 10/05/2019   History of Present Illness  [name] is a 86 y.o. male with PMH significant for chronic diastolic heart failure, HTN, s/p TAVR, diverticulosis, GERD, Gilbert's syndrome, HOH, rheumatoid arthritis, and CKD stage III who presents with concerns of syncope    Clinical Impression  Patient presented sitting in bed, awake, eating lunch, but willing to participate in therapy. PTA, pt was independent in all ADL's and was ambulating independently. Pt lives with alone in a single level home with 3 steps to enter. His daughter checks on him every other day. At the time of evaluation, pt was independent with bed mobility and transfers. He was able to ambulate ~500' without an AD, with min guard for safety. He was able to perform higher level balance tasks consisting of horizontal and vertical head turns and varying his gait speed without LOB or gait deviations. He stated he felt "very close to normal" in regards to gait. Recommend d/c home with no PT f/u once medically appropriate, as pt appears to be at his baseline level of functioning. All questions and concerned addressed- pt in agreement. PT will sign-off.   [employer] interpreter Caleen Essex [phone] utilized for this session     Follow Up Recommendations No PT follow up    Equipment Recommendations  None recommended by PT    Recommendations for Other Services       Precautions / Restrictions Precautions Precautions: None Precaution Comments: uses [place] translator Restrictions Weight Bearing Restrictions: No      Mobility  Bed Mobility Overal bed mobility: Independent             General bed mobility comments: no physical assist needed, pt slightly impulsive  Transfers Overall transfer level: Modified independent Equipment used: None             General transfer comment: pushes up from bed with UE, no  physical assist needed  Ambulation/Gait Ambulation/Gait assistance: Min guard;Supervision Gait Distance (Feet): 500 Feet Assistive device: None Gait Pattern/deviations: Step-through pattern;WFL(Within Functional Limits) Gait velocity: normal   General Gait Details: min guard for safety- no overt LOB noted. Pt able to perform head turns and dynamic challenges without gait deviation  Stairs            Wheelchair Mobility    Modified Rankin (Stroke Patients Only)       Balance Overall balance assessment: Independent                                           Pertinent Vitals/Pain Pain Assessment: No/denies pain    Home Living Family/patient expects to be discharged to:: Private residence Living Arrangements: Alone Available Help at Discharge: Family;Available PRN/intermittently Type of Home: House Home Access: Stairs to enter   [employer] of Steps: 3 Home Layout: One level   Additional Comments: Daughter comes to visit every other day    Prior Function Level of Independence: Independent         Comments: Enjoys Lawyer Dominance        Extremity/Trunk Assessment   Upper Extremity Assessment Upper Extremity Assessment: Overall WFL for tasks assessed    Lower Extremity Assessment Lower Extremity Assessment: Overall WFL for tasks assessed       Communication   Communication: Prefers language other than English(Russian)  Cognition Arousal/Alertness: Awake/alert Behavior During Therapy: WFL for tasks assessed/performed;Impulsive Overall Cognitive Status: Within Functional Limits for tasks assessed                                        General Comments      Exercises     Assessment/Plan    PT Assessment Patent does not need any further PT services  PT Problem List         PT Treatment Interventions      PT Goals (Current goals can be found in the Care Plan section)  Acute Rehab  PT Goals Patient Stated Goal: to go home    Frequency     Barriers to discharge        Co-evaluation               AM-PAC PT "6 Clicks" Mobility  Outcome Measure Help needed turning from your back to your side while in a flat bed without using bedrails?: None Help needed moving from lying on your back to sitting on the side of a flat bed without using bedrails?: None Help needed moving to and from a bed to a chair (including a wheelchair)?: None Help needed standing up from a chair using your arms (e.g., wheelchair or bedside chair)?: None Help needed to walk in hospital room?: None Help needed climbing 3-5 steps with a railing? : A Little 6 Click Score: 23    End of Session   Activity Tolerance: Patient tolerated treatment well Patient left: in bed;with call bell/phone within reach Nurse Communication: Mobility status PT Visit Diagnosis: Other abnormalities of gait and mobility (R26.89)    Time: [id number] PT Time Calculation (min) (ACUTE ONLY): 15 min   Charges:             Dara Hoyer, SPT Acute Rehab  [id number]   Chaylee Ehrsam 10/05/2019, 12:48 PM

## 2019-10-05 NOTE — Progress Notes (Signed)
  Echocardiogram 2D Echocardiogram has been performed.  [name] M 10/05/2019, 9:50 AM

## 2019-10-05 NOTE — ED Notes (Signed)
Tele   Breakfast ordered  

## 2019-10-05 NOTE — Care Management Obs Status (Signed)
[facility] NOTIFICATION   Patient Details  Name: [name] MRN: [id number] Date of Birth: [birth year]   Medicare Observation Status Notification Given:       Verdell Carmine, RN 10/05/2019, 3:38 PM

## 2019-10-05 NOTE — Progress Notes (Addendum)
PT EVALUATION CHARGES   10/05/19 1300  PT Visit Information  Last PT Received On 10/05/19  PT General Charges  $$ ACUTE PT VISIT 1 Visit  PT Evaluation  $PT Eval Low Complexity 1 Low     Wyona Almas, PT, DPT Acute Rehabilitation Services Pager [phone] Office [phone]

## 2019-10-05 NOTE — Discharge Summary (Signed)
Physician Discharge Summary  [name] [id number] DOB: March 16, 1928 DOA: 10/04/2019  PCP: Lajean Manes, MD  Admit date: 10/04/2019 Discharge date: 10/05/2019  Admitted From: Home Disposition:  Home  Recommendations for Outpatient Follow-up:  1. Follow up with PCP in 1-2 weeks  Discharge Condition:Stable CODE STATUS:Full Diet recommendation: Regular   Brief/Interim Summary: 86yo presenting with syncopal episode while standing, with pre-syncopal symptoms followed by nausea, weakness, and malaise upon awakening. Observed overnight  Discharge Diagnoses:  Principal Problem:   Syncope Active Problems:   Sinus bradycardia   Benign essential HTN   S/P TAVR (transcatheter aortic valve replacement)   RA (rheumatoid arthritis) (HCC)   Benign prostatic hyperplasia   Depression   Chronic diastolic CHF (congestive heart failure) (HCC)  Syncope with loss of consciousness Suspect likely vasovagal syncope. Advised pt to stand up more slowly in the future and to remain well hydrated Orthostatic vitals are unremarkable TSH of 3.250 2d echo performed and reviewed. Unremarkable  Asymptomatic bradycardia Monitored on telemetry  Chronic diastolic heart failure Appears euvolemic  Hypertension Remained stable continue Losartan  Rheumatoid arthritis Continue Plaquenil  CKD stage IIIa Stable Avoid nephrotoxic agent  BPH  continue dutasteroid  Depression Continue SSRI   Discharge Instructions   Allergies as of 10/05/2019      Reactions   Ativan [lorazepam] Other (See Comments)   hallucinations   Phenergan [promethazine Hcl] Hypertension   Eggs Or Egg-derived Products Rash   Small rash after eating for several days in a row      Medication List    STOP taking these medications   amoxicillin 500 MG tablet Commonly known as: AMOXIL     TAKE these medications   aspirin 81 MG tablet Take 1 tablet (81 mg total) by mouth daily.   cholecalciferol 25 MCG  (1000 UNIT) tablet Commonly known as: VITAMIN D3 Take 1,000 Units by mouth daily.   dutasteride 0.5 MG capsule Commonly known as: AVODART Take 0.5 mg by mouth daily.   escitalopram 20 MG tablet Commonly known as: LEXAPRO Take 1 tablet (20 mg total) by mouth daily. What changed: when to take this   Fish Oil 1000 MG Caps Take 1,000 mg by mouth daily.   hydroxychloroquine 200 MG tablet Commonly known as: PLAQUENIL Take 400 mg by mouth daily.   losartan 25 MG tablet Commonly known as: COZAAR Take 25 mg by mouth daily.   melatonin 5 MG Tabs Take 5 mg by mouth at bedtime as needed (for sleep).   pantoprazole 40 MG tablet Commonly known as: PROTONIX Take 1 tablet (40 mg total) by mouth daily.   vitamin C 1000 MG tablet Take 1,000 mg by mouth daily after lunch.      Follow-up Information    Stoneking, Hal, MD. Schedule an appointment as soon as possible for a visit in 2 week(s).   Specialty: Internal Medicine Contact information: [address] [address] [phone]        Jerline Pain, MD .   Specialty: Cardiology Contact information: [phone] [address] [phone]        Burnell Blanks, MD .   Specialty: Cardiology Contact information: [place] [address]  [address] [phone]          Allergies  Allergen Reactions  . Ativan [Lorazepam] Other (See Comments)    hallucinations  . Phenergan [Promethazine Hcl] Hypertension  . Eggs Or Egg-Derived Products Rash    Small  rash after eating for several days in a row   Procedures/Studies: CT Head Wo Contrast  Result Date: 10/04/2019 CLINICAL DATA:  Fall, question of seizure EXAM: CT HEAD WITHOUT CONTRAST TECHNIQUE: Contiguous axial images were obtained from the base of the skull through the vertex without intravenous contrast. COMPARISON:  None. FINDINGS: Brain: No evidence of acute territorial infarction,  hemorrhage, hydrocephalus,extra-axial collection or mass lesion/mass effect. There is dilatation the ventricles and sulci consistent with age-related atrophy. Low-attenuation changes in the deep white matter consistent with small vessel ischemia. Prior lacunar infarct seen within the bilateral basal ganglia. Vascular: No hyperdense vessel or unexpected calcification. Skull: The skull is intact. No fracture or focal lesion identified. Sinuses/Orbits: The visualized paranasal sinuses and mastoid air cells are clear. The orbits and globes intact. Other: None Cervical spine: Alignment: Physiologic Skull base and vertebrae: Visualized skull base is intact. No atlanto-occipital dissociation. The vertebral body heights are well maintained. No fracture or pathologic osseous lesion seen. Soft tissues and spinal canal: The visualized paraspinal soft tissues are unremarkable. No prevertebral soft tissue swelling is seen. The spinal canal is grossly unremarkable, no large epidural collection or significant canal narrowing. Disc levels: Multilevel cervical spine spondylosis is seen with anterior osteophytes, disc osteophyte complex and uncovertebral osteophytes which is most notable at C4 through C7 with moderate to severe neural foraminal narrowing and mild central canal stenosis. There is scattered calcification along the anterior longitudinal ligament from C4 through C7. Upper chest: The lung apices are clear. Thoracic inlet is within normal limits. Other: None IMPRESSION: 1.  No acute intracranial abnormality. 2. Findings consistent with age related atrophy and chronic small vessel ischemia 3.  No acute fracture or malalignment of the spine. 4. Cervical spine spondylosis most notable from C4 through C7. Electronically Signed   By: Prudencio Pair M.D.   On: 10/04/2019 22:19   CT CERVICAL SPINE WO CONTRAST  Result Date: 10/04/2019 CLINICAL DATA:  Fall, question of seizure EXAM: CT HEAD WITHOUT CONTRAST TECHNIQUE: Contiguous  axial images were obtained from the base of the skull through the vertex without intravenous contrast. COMPARISON:  None. FINDINGS: Brain: No evidence of acute territorial infarction, hemorrhage, hydrocephalus,extra-axial collection or mass lesion/mass effect. There is dilatation the ventricles and sulci consistent with age-related atrophy. Low-attenuation changes in the deep white matter consistent with small vessel ischemia. Prior lacunar infarct seen within the bilateral basal ganglia. Vascular: No hyperdense vessel or unexpected calcification. Skull: The skull is intact. No fracture or focal lesion identified. Sinuses/Orbits: The visualized paranasal sinuses and mastoid air cells are clear. The orbits and globes intact. Other: None Cervical spine: Alignment: Physiologic Skull base and vertebrae: Visualized skull base is intact. No atlanto-occipital dissociation. The vertebral body heights are well maintained. No fracture or pathologic osseous lesion seen. Soft tissues and spinal canal: The visualized paraspinal soft tissues are unremarkable. No prevertebral soft tissue swelling is seen. The spinal canal is grossly unremarkable, no large epidural collection or significant canal narrowing. Disc levels: Multilevel cervical spine spondylosis is seen with anterior osteophytes, disc osteophyte complex and uncovertebral osteophytes which is most notable at C4 through C7 with moderate to severe neural foraminal narrowing and mild central canal stenosis. There is scattered calcification along the anterior longitudinal ligament from C4 through C7. Upper chest: The lung apices are clear. Thoracic inlet is within normal limits. Other: None IMPRESSION: 1.  No acute intracranial abnormality. 2. Findings consistent with age related atrophy and chronic small vessel ischemia 3.  No acute fracture or malalignment  of the spine. 4. Cervical spine spondylosis most notable from C4 through C7. Electronically Signed   By: Prudencio Pair  M.D.   On: 10/04/2019 22:19   DG Chest Port 1 View  Result Date: 10/04/2019 CLINICAL DATA:  Syncope EXAM: PORTABLE CHEST 1 VIEW COMPARISON:  Radiograph 09/22/2018, CT 06/04/2018 FINDINGS: Lung volumes are diminished with increased streaky opacities. Suspect some mild central vascular congestion as well with vascular crowding secondary to volume loss.No pneumothorax or effusion. Prior aortic valve stent graft replacement is noted in stable position accounting for differences in technique. Prominence of the cardiac silhouette likely related to portable imaging. The aorta is calcified. The remaining cardiomediastinal contours are unremarkable. No acute osseous or soft tissue abnormality. Degenerative changes are present in the imaged spine and shoulders. Surgical clips at the base of the right neck may reflect prior thyroidectomy/hemithyroidectomy. Telemetry leads overlie the chest. IMPRESSION: 1. Low lung volumes with increased streaky opacities, favor atelectasis though mild edema may be present as well with some central vascular congestion. Electronically Signed   By: Lovena Le M.D.   On: 10/04/2019 22:26   ECHOCARDIOGRAM COMPLETE  Result Date: 10/05/2019    ECHOCARDIOGRAM REPORT   Patient Name:   [name] Date of Exam: 10/05/2019 Medical Rec #:  [id number]     Height:       68.0 in Accession #:    [id number]    Weight:       165.3 lb Date of Birth:  06-06-1928      BSA:          1.885 m Patient Age:    39 years      BP:           154/60 mmHg Patient Gender: M             HR:           55 bpm. Exam Location:  Inpatient Procedure: 2D Echo Indications:    Syncope 780.2 / R55  History:        Patient has prior history of Echocardiogram examinations, most                 recent 04/25/2019. Risk Factors:Hypertension and Dyslipidemia.                 GERD.                 Aortic Valve: 23 mm Edwards Sapien prosthetic, stented (TAVR)                 valve is present in the aortic position. Procedure Date:                  03/27/2018.  Sonographer:    Darlina Sicilian RDCS Referring Phys: [id number] [place]e  1. Left ventricular ejection fraction, by estimation, is 60 to 65%. The left ventricle has normal function. The left ventricle has no regional wall motion abnormalities. Left ventricular diastolic parameters are consistent with Grade I diastolic dysfunction (impaired relaxation). Elevated left atrial pressure. The average left ventricular global longitudinal strain is -19.0 %.  2. Right ventricular systolic function is normal. The right ventricular size is normal. There is mildly elevated pulmonary artery systolic pressure. The estimated right ventricular systolic pressure is [id number] mmHg.  3. The mitral valve is normal in structure. No evidence of mitral valve regurgitation. No evidence of mitral stenosis.  4. There is a mild perivalvular leak (TAVR). The aortic valve is normal in  structure. Aortic valve regurgitation is mild. No aortic stenosis is present. There is a 23 mm Edwards Sapien prosthetic (TAVR) valve present in the aortic position. Procedure Date: 03/27/2018. Aortic valve area, by VTI measures 2.23 cm. Aortic valve mean gradient measures 9.6 mmHg. Aortic valve Vmax measures 2.16 m/s.  5. The inferior vena cava is normal in size with greater than 50% respiratory variability, suggesting right atrial pressure of 3 mmHg. Comparison(s): No significant change from prior study. Prior images reviewed side by side. FINDINGS  Left Ventricle: Left ventricular ejection fraction, by estimation, is 60 to 65%. The left ventricle has normal function. The left ventricle has no regional wall motion abnormalities. The average left ventricular global longitudinal strain is -19.0 %. The left ventricular internal cavity size was normal in size. There is no left ventricular hypertrophy. Left ventricular diastolic parameters are consistent with Grade I diastolic dysfunction (impaired relaxation). Elevated left atrial  pressure. Right Ventricle: The right ventricular size is normal. No increase in right ventricular wall thickness. Right ventricular systolic function is normal. There is mildly elevated pulmonary artery systolic pressure. The tricuspid regurgitant velocity is 2.46  m/s, and with an assumed right atrial pressure of 8 mmHg, the estimated right ventricular systolic pressure is [id number] mmHg. Left Atrium: Left atrial size was normal in size. Right Atrium: Right atrial size was normal in size. Pericardium: There is no evidence of pericardial effusion. Mitral Valve: The mitral valve is normal in structure. Normal mobility of the mitral valve leaflets. No evidence of mitral valve regurgitation. No evidence of mitral valve stenosis. Tricuspid Valve: The tricuspid valve is normal in structure. Tricuspid valve regurgitation is trivial. No evidence of tricuspid stenosis. Aortic Valve: There is a mild perivalvular leak (TAVR). The aortic valve is normal in structure. Aortic valve regurgitation is mild. Aortic regurgitation PHT measures 377 msec. No aortic stenosis is present. Aortic valve mean gradient measures 9.6 mmHg. Aortic valve peak gradient measures 18.7 mmHg. Aortic valve area, by VTI measures 2.23 cm. There is a 23 mm Edwards Sapien prosthetic, stented (TAVR) valve present in the aortic position. Procedure Date: 03/27/2018. Pulmonic Valve: The pulmonic valve was normal in structure. Pulmonic valve regurgitation is trivial. No evidence of pulmonic stenosis. Aorta: The aortic root is normal in size and structure. Venous: The inferior vena cava is normal in size with greater than 50% respiratory variability, suggesting right atrial pressure of 3 mmHg. IAS/Shunts: No atrial level shunt detected by color flow Doppler.  LEFT VENTRICLE PLAX 2D LVIDd:         4.10 cm  Diastology LVIDs:         2.70 cm  LV e' lateral:   6.20 cm/s LV PW:         0.90 cm  LV E/e' lateral: 15.8 LV IVS:        1.10 cm  LV e' medial:    5.98 cm/s LVOT  diam:     2.03 cm  LV E/e' medial:  16.4 LV SV:         107 LV SV Index:   57       2D Longitudinal Strain LVOT Area:     3.25 cm 2D Strain GLS Avg:     -19.0 %  RIGHT VENTRICLE RV S prime:     17.30 cm/s TAPSE (M-mode): 1.6 cm LEFT ATRIUM             Index       RIGHT ATRIUM  Index LA diam:        4.00 cm 2.12 cm/m  RA Area:     11.80 cm LA Vol (A2C):   46.3 ml 24.56 ml/m RA Volume:   18.00 ml  9.55 ml/m LA Vol (A4C):   55.8 ml 29.60 ml/m LA Biplane Vol: 51.8 ml 27.48 ml/m  AORTIC VALVE AV Area (Vmax):    2.00 cm AV Area (Vmean):   2.27 cm AV Area (VTI):     2.23 cm AV Vmax:           216.20 cm/s AV Vmean:          140.400 cm/s AV VTI:            0.480 m AV Peak Grad:      18.7 mmHg AV Mean Grad:      9.6 mmHg LVOT Vmax:         133.25 cm/s LVOT Vmean:        98.000 cm/s LVOT VTI:          0.330 m LVOT/AV VTI ratio: 0.69 AI PHT:            377 msec  AORTA Ao Root diam: 2.30 cm MITRAL VALVE                TRICUSPID VALVE MV Area (PHT): 2.11 cm     TR Peak grad:   24.2 mmHg MV Decel Time: 359 msec     TR Vmax:        246.00 cm/s MV E velocity: 97.90 cm/s MV A velocity: 135.00 cm/s  SHUNTS MV E/A ratio:  0.73         Systemic VTI:  0.33 m                             Systemic Diam: 2.03 cm Candee Furbish MD Electronically signed by Candee Furbish MD Signature Date/Time: 10/05/2019/2:18:36 PM    Final      Subjective: Eager to go home  Discharge Exam: Vitals:   10/05/19 0525 10/05/19 1224  BP: (!) 154/60 (!) 144/54  Pulse: (!) 55 (!) 59  Resp: 18 18  Temp: 98 F (36.7 C) 98 F (36.7 C)  SpO2: 98% 97%   Vitals:   10/05/19 0330 10/05/19 0345 10/05/19 0525 10/05/19 1224  BP: (!) 144/63 (!) 140/58 (!) 154/60 (!) 144/54  Pulse: (!) 55 (!) 56 (!) 55 (!) 59  Resp: 16 17 18 18   Temp:   98 F (36.7 C) 98 F (36.7 C)  TempSrc:   Oral Oral  SpO2: 96% 94% 98% 97%  Weight:      Height:        General: Pt is alert, awake, not in acute distress Cardiovascular: RRR, S1/S2 +, no rubs, no  gallops Respiratory: CTA bilaterally, no wheezing, no rhonchi Abdominal: Soft, NT, ND, bowel sounds + Extremities: no edema, no cyanosis   The results of significant diagnostics from this hospitalization (including imaging, microbiology, ancillary and laboratory) are listed below for reference.     Microbiology: Recent Results (from the past 240 hour(s))  Respiratory Panel by RT PCR (Flu A&B, Covid) - Nasopharyngeal Swab     Status: None   Collection Time: 10/04/19 11:18 PM   Specimen: Nasopharyngeal Swab  Result Value Ref Range Status   SARS Coronavirus 2 by RT PCR NEGATIVE NEGATIVE Final    Comment: (NOTE) SARS-CoV-2 target nucleic acids are NOT DETECTED. The  SARS-CoV-2 RNA is generally detectable in upper respiratoy specimens during the acute phase of infection. The lowest concentration of SARS-CoV-2 viral copies this assay can detect is 131 copies/mL. A negative result does not preclude SARS-Cov-2 infection and should not be used as the sole basis for treatment or other patient management decisions. A negative result may occur with  improper specimen collection/handling, submission of specimen other than nasopharyngeal swab, presence of viral mutation(s) within the areas targeted by this assay, and inadequate number of viral copies (<131 copies/mL). A negative result must be combined with clinical observations, patient history, and epidemiological information. The expected result is Negative. Fact Sheet for Patients:  PinkCheek.be Fact Sheet for Healthcare Providers:  GravelBags.it This test is not yet ap proved or cleared by the [place] FDA and  has been authorized for detection and/or diagnosis of SARS-CoV-2 by FDA under an Emergency Use Authorization (EUA). This EUA will remain  in effect (meaning this test can be used) for the duration of the COVID-19 declaration under Section 564(b)(1) of the Act, 21  U.S.C. section 360bbb-3(b)(1), unless the authorization is terminated or revoked sooner.    Influenza A by PCR NEGATIVE NEGATIVE Final   Influenza B by PCR NEGATIVE NEGATIVE Final    Comment: (NOTE) The Xpert Xpress SARS-CoV-2/FLU/RSV assay is intended as an aid in  the diagnosis of influenza from Nasopharyngeal swab specimens and  should not be used as a sole basis for treatment. Nasal washings and  aspirates are unacceptable for Xpert Xpress SARS-CoV-2/FLU/RSV  testing. Fact Sheet for Patients: PinkCheek.be Fact Sheet for Healthcare Providers: GravelBags.it This test is not yet approved or cleared by the [place] FDA and  has been authorized for detection and/or diagnosis of SARS-CoV-2 by  FDA under an Emergency Use Authorization (EUA). This EUA will remain  in effect (meaning this test can be used) for the duration of the  Covid-19 declaration under Section 564(b)(1) of the Act, 21  U.S.C. section 360bbb-3(b)(1), unless the authorization is  terminated or revoked. Performed at [facility], [place] [address] [place], [place] [address]      Labs: BNP (last 3 results) Recent Labs    10/09/18 2017 10/04/19 2133  BNP 52.5 [id number]   Basic Metabolic Panel: Recent Labs  Lab 10/04/19 2133 10/04/19 2222 10/05/19 0837  NA 140 141 137  K 3.8 3.8 4.1  CL 105 103 105  CO2 22  --  23  GLUCOSE 148* 126* 118*  BUN 20 22 22   CREATININE 1.21 1.30* 1.09  CALCIUM 9.1  --  8.9   Liver Function Tests: Recent Labs  Lab 10/04/19 2133  AST 31  ALT 27  ALKPHOS 59  BILITOT 1.0  PROT 6.9  ALBUMIN 4.0   No results for input(s): LIPASE, AMYLASE in the last 168 hours. No results for input(s): AMMONIA in the last 168 hours. CBC: Recent Labs  Lab 10/04/19 2133 10/04/19 2222 10/05/19 0837  WBC 6.2  --  6.7  NEUTROABS 4.0  --   --   HGB 13.6 14.6 13.3  HCT 43.1 43.0 41.4  MCV 90.0  --  89.0  PLT 139*  --  129*    Cardiac Enzymes: No results for input(s): CKTOTAL, CKMB, CKMBINDEX, TROPONINI in the last 168 hours. BNP: Invalid input(s): POCBNP CBG: No results for input(s): GLUCAP in the last 168 hours. D-Dimer No results for input(s): DDIMER in the last 72 hours. Hgb A1c No results for input(s): HGBA1C in the last 72 hours.  Lipid Profile No results for input(s): CHOL, HDL, LDLCALC, TRIG, CHOLHDL, LDLDIRECT in the last 72 hours. Thyroid function studies Recent Labs    10/05/19 0837  TSH 3.250   Anemia work up No results for input(s): VITAMINB12, FOLATE, FERRITIN, TIBC, IRON, RETICCTPCT in the last 72 hours. Urinalysis    Component Value Date/Time   COLORURINE YELLOW 10/04/2019 2136   APPEARANCEUR CLEAR 10/04/2019 2136   LABSPEC 1.019 10/04/2019 2136   PHURINE 5.0 10/04/2019 2136   GLUCOSEU NEGATIVE 10/04/2019 2136   HGBUR NEGATIVE 10/04/2019 2136   BILIRUBINUR NEGATIVE 10/04/2019 2136   KETONESUR NEGATIVE 10/04/2019 2136   PROTEINUR NEGATIVE 10/04/2019 2136   UROBILINOGEN 1.0 04/14/2014 0116   NITRITE NEGATIVE 10/04/2019 2136   LEUKOCYTESUR NEGATIVE 10/04/2019 2136   Sepsis Labs Invalid input(s): PROCALCITONIN,  WBC,  LACTICIDVEN Microbiology Recent Results (from the past 240 hour(s))  Respiratory Panel by RT PCR (Flu A&B, Covid) - Nasopharyngeal Swab     Status: None   Collection Time: 10/04/19 11:18 PM   Specimen: Nasopharyngeal Swab  Result Value Ref Range Status   SARS Coronavirus 2 by RT PCR NEGATIVE NEGATIVE Final    Comment: (NOTE) SARS-CoV-2 target nucleic acids are NOT DETECTED. The SARS-CoV-2 RNA is generally detectable in upper respiratoy specimens during the acute phase of infection. The lowest concentration of SARS-CoV-2 viral copies this assay can detect is 131 copies/mL. A negative result does not preclude SARS-Cov-2 infection and should not be used as the sole basis for treatment or other patient management decisions. A negative result may occur with   improper specimen collection/handling, submission of specimen other than nasopharyngeal swab, presence of viral mutation(s) within the areas targeted by this assay, and inadequate number of viral copies (<131 copies/mL). A negative result must be combined with clinical observations, patient history, and epidemiological information. The expected result is Negative. Fact Sheet for Patients:  PinkCheek.be Fact Sheet for Healthcare Providers:  GravelBags.it This test is not yet ap proved or cleared by the [place] FDA and  has been authorized for detection and/or diagnosis of SARS-CoV-2 by FDA under an Emergency Use Authorization (EUA). This EUA will remain  in effect (meaning this test can be used) for the duration of the COVID-19 declaration under Section 564(b)(1) of the Act, 21 U.S.C. section 360bbb-3(b)(1), unless the authorization is terminated or revoked sooner.    Influenza A by PCR NEGATIVE NEGATIVE Final   Influenza B by PCR NEGATIVE NEGATIVE Final    Comment: (NOTE) The Xpert Xpress SARS-CoV-2/FLU/RSV assay is intended as an aid in  the diagnosis of influenza from Nasopharyngeal swab specimens and  should not be used as a sole basis for treatment. Nasal washings and  aspirates are unacceptable for Xpert Xpress SARS-CoV-2/FLU/RSV  testing. Fact Sheet for Patients: PinkCheek.be Fact Sheet for Healthcare Providers: GravelBags.it This test is not yet approved or cleared by the [place] FDA and  has been authorized for detection and/or diagnosis of SARS-CoV-2 by  FDA under an Emergency Use Authorization (EUA). This EUA will remain  in effect (meaning this test can be used) for the duration of the  Covid-19 declaration under Section 564(b)(1) of the Act, 21  U.S.C. section 360bbb-3(b)(1), unless the authorization is  terminated or revoked. Performed at  [facility], [place] [address], [place], [place] [address]    Time spent: 30 min  SIGNED:   Marylu Lund, MD  [facility] 10/05/2019, 4:24 PM  If 7PM-7AM, please contact night-coverage

## 2019-10-05 NOTE — Progress Notes (Signed)
Pt given discharge summary and was explained to via Interpretor. Daughter to provide transportation.

## 2019-10-08 ENCOUNTER — Ambulatory Visit (INDEPENDENT_AMBULATORY_CARE_PROVIDER_SITE_OTHER): Payer: Medicare Other | Admitting: Dermatology

## 2019-10-08 ENCOUNTER — Encounter: Payer: Self-pay | Admitting: Dermatology

## 2019-10-08 ENCOUNTER — Other Ambulatory Visit: Payer: Self-pay

## 2019-10-08 DIAGNOSIS — D485 Neoplasm of uncertain behavior of skin: Secondary | ICD-10-CM | POA: Diagnosis not present

## 2019-10-08 DIAGNOSIS — L400 Psoriasis vulgaris: Secondary | ICD-10-CM

## 2019-10-08 NOTE — Patient Instructions (Addendum)
Biopsy, Surgery (Curettage) & Surgery (Excision) Aftercare Instructions  1. Okay to remove bandage in 24 hours  2. Wash area with soap and water  3. Apply Vaseline to area twice daily until healed (Not Neosporin)  4. Okay to cover with a Band-Aid to decrease the chance of infection or prevent irritation from clothing; also it's okay to uncover lesion at home.  5. Suture instructions: return to our office in 7-10 or 10-14 days for a nurse visit for suture removal. Variable healing with sutures, if pain or itching occurs call our office. It's okay to shower or bathe 24 hours after sutures are given.  6. The following risks may occur after a biopsy, curettage or excision: bleeding, scarring, discoloration, recurrence, infection (redness, yellow drainage, pain or swelling).  7. For questions, concerns and results call our office at [place] before 4pm & Friday before 3pm. Biopsy results will be available in 1 week.  Biopsy, Surgery (Curettage) & Surgery (Excision) Aftercare Instructions  1. Okay to remove bandage in 24 hours  2. Wash area with soap and water  3. Apply Vaseline to area twice daily until healed (Not Neosporin)  4. Okay to cover with a Band-Aid to decrease the chance of infection or prevent irritation from clothing; also it's okay to uncover lesion at home.  5. Suture instructions: return to our office in 7-10 or 10-14 days for a nurse visit for suture removal. Variable healing with sutures, if pain or itching occurs call our office. It's okay to shower or bathe 24 hours after sutures are given.  6. The following risks may occur after a biopsy, curettage or excision: bleeding, scarring, discoloration, recurrence, infection (redness, yellow drainage, pain or swelling).  7. For questions, concerns and results call our office at [place] before 4pm & Friday before 3pm. Biopsy results will be available in 1 week.  Follow-up visit for [name] he is here with  his daughter in an interpreter.  The rash on his feet has spread.  The new scaly spots are much more like psoriasis.  After explaining the procedure with his daughter, two shave biopsies obtained from the top of the left foot.  There is no special care for these but they may use a little Neosporin or triple antibiotic ointment.  I have asked the family to contact me either via MyChart or by phone call on Thursday or Friday to discuss the results. I have asked his daughter to read about systemic options on the website [email or web address].  Additionally most toenails are thickened and opaque.  This may be pure psoriasis, pure fungus, or likely a combination of the 2.  Unfortunately, I had to tell his daughter that topical therapy would almost certainly be of no value.

## 2019-10-11 ENCOUNTER — Encounter: Payer: Self-pay | Admitting: Dermatology

## 2019-10-11 NOTE — Progress Notes (Addendum)
   Follow-Up Visit   Subjective  [name] is a 86 y.o. male who presents for the following: Rash (rash on left ankle and now has spread to right ankle used Augmented Beta dip cream and didnt help).  rash Location:  Duration:  Quality:  Associated Signs/Symptoms: Modifying Factors:  Severity:  Timing: Context:   The following portions of the chart were reviewed this encounter and updated as appropriate: Tobacco  Allergies  Meds  Problems  Med Hx  Surg Hx  Fam Hx      Objective  Well appearing patient in no apparent distress; mood and affect are within normal limits.  A focused examination was performed including scalp, face, ears, neck, back, arms, nails, legs, feet.. Relevant physical exam findings are noted in the Assessment and Plan.   Assessment & Plan  Plaque psoriasis (2) Left Instep  First try to confirm bx with biopsies then decide on topical vs systemic therapy  Specimen 1 - Surgical pathology Differential Diagnosis: R/O Psoriasis Check Margins: No  Specimen 2 - Surgical pathology Differential Diagnosis: R/O Psoriasis Check Margins: No  Neoplasm of uncertain behavior of skin (2) Left Dorsum of Foot  Skin / nail biopsy Type of biopsy: tangential   Informed consent: discussed and consent obtained   Timeout: patient name, date of birth, surgical site, and procedure verified   Procedure prep:  Patient was prepped and draped in usual sterile fashion Prep type:  Chlorhexidine Anesthesia: the lesion was anesthetized in a standard fashion   Anesthetic:  1% lidocaine w/ epinephrine 1-100,000 local infiltration Instrument used: flexible razor blade   Hemostasis achieved with: ferric subsulfate   Outcome: patient tolerated procedure well   Post-procedure details: sterile dressing applied and wound care instructions given   Dressing type: petrolatum   Additional details:  Patient identified lesion of concern.  Lesion identified by physician.  Left  Dorsum of Foot  Skin / nail biopsy Type of biopsy: tangential   Informed consent: discussed and consent obtained   Timeout: patient name, date of birth, surgical site, and procedure verified   Procedure prep:  Patient was prepped and draped in usual sterile fashion Prep type:  Chlorhexidine Anesthesia: the lesion was anesthetized in a standard fashion   Anesthetic:  1% lidocaine w/ epinephrine 1-100,000 local infiltration Instrument used: flexible razor blade   Hemostasis achieved with: ferric subsulfate   Outcome: patient tolerated procedure well   Post-procedure details: sterile dressing applied and wound care instructions given   Dressing type: petrolatum   Additional details:  Patient identified lesion of concern.  Lesion identified by physician. Follow-up visit for [name] he is here with his daughter in an interpreter.  The rash on his feet has spread.  The new scaly spots are much more like psoriasis.  After explaining the procedure with his daughter, two shave biopsies obtained from the top of the left foot.  There is no special care for these but they may use a little Neosporin or triple antibiotic ointment.  I have asked the family to contact me either via MyChart or by phone call on Thursday or Friday to discuss the results. I have asked his daughter to read about systemic options on the website [email or web address].  Additionally most toenails are thickened and opaque.  This may be pure psoriasis, pure fungus, or likely a combination of the 2.  Unfortunately, I had to tell his daughter that topical therapy would almost certainly be of no value.

## 2019-10-14 ENCOUNTER — Telehealth: Payer: Self-pay | Admitting: *Deleted

## 2019-10-14 NOTE — Telephone Encounter (Signed)
Pathology results to patient daughter.   Appointment made for [facility]

## 2019-11-07 ENCOUNTER — Ambulatory Visit: Payer: Medicare Other | Admitting: Dermatology

## 2019-12-12 IMAGING — US ULTRASOUND ABDOMEN LIMITED
1 series · 14 of 25 positions shown · non-contrast
Comparison: Prior MRI from 09/24/2018

CLINICAL DATA: Initial evaluation for acute right upper quadrant
pain.

EXAM:
ULTRASOUND ABDOMEN LIMITED RIGHT UPPER QUADRANT

[Series 1: ultrasound abdomen limited · 14 of 58 slices shown]
[im 1/58]
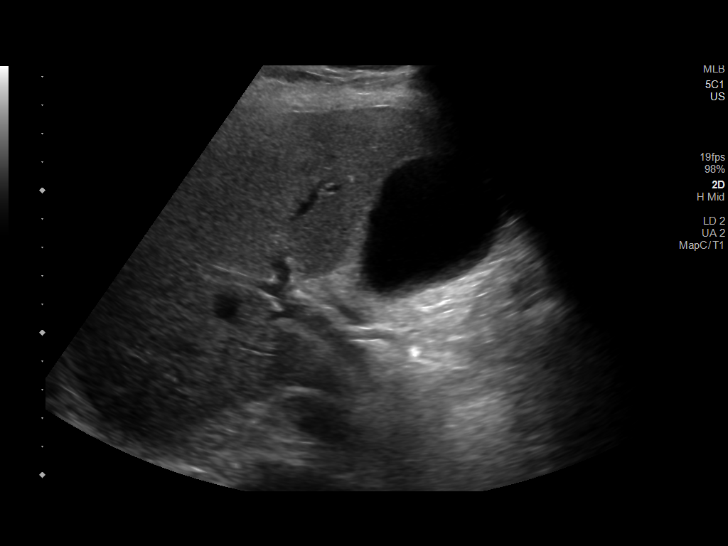
[im 5/58]
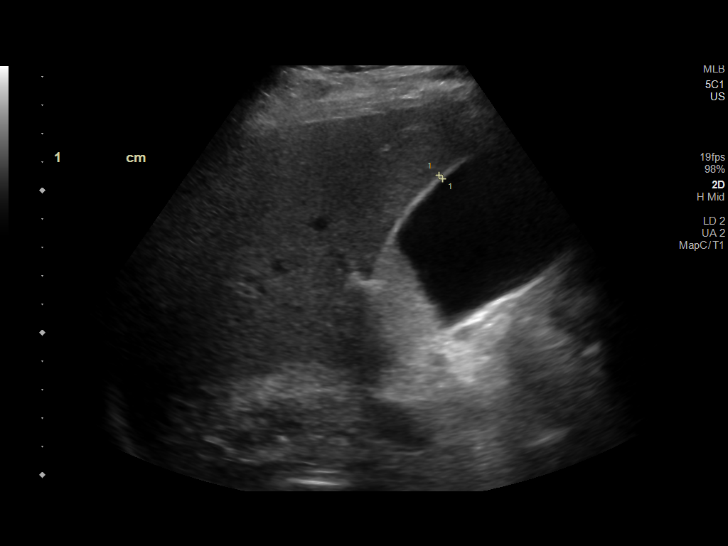
[im 10/58]
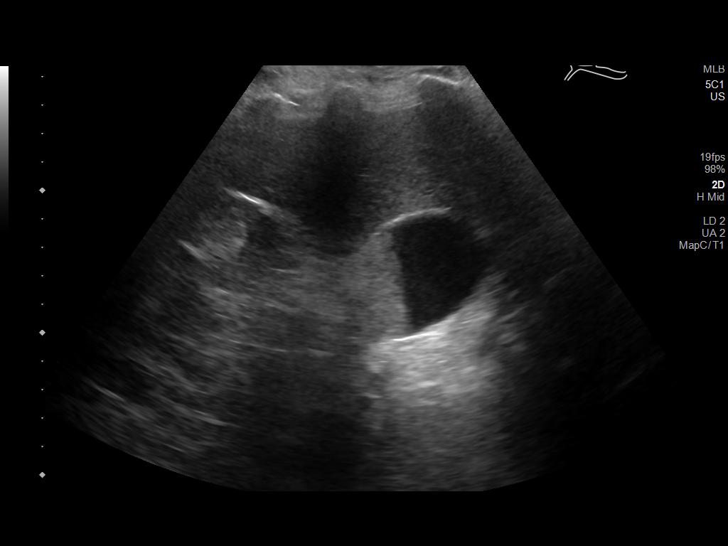
[im 15/58]
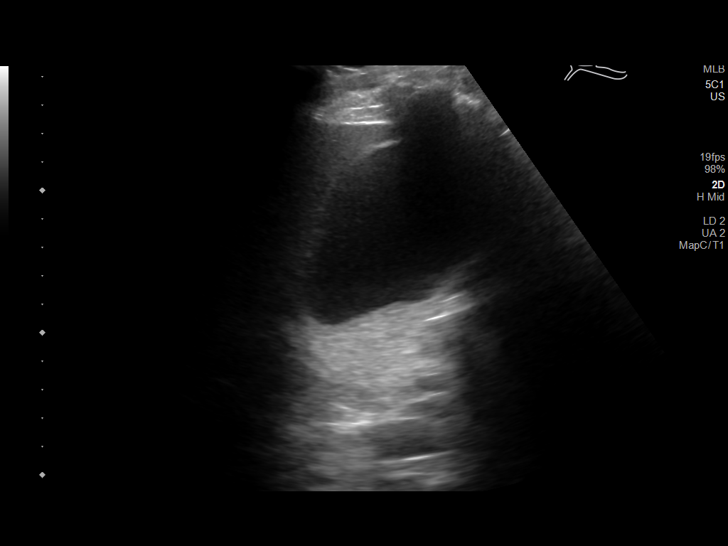
[im 20/58]
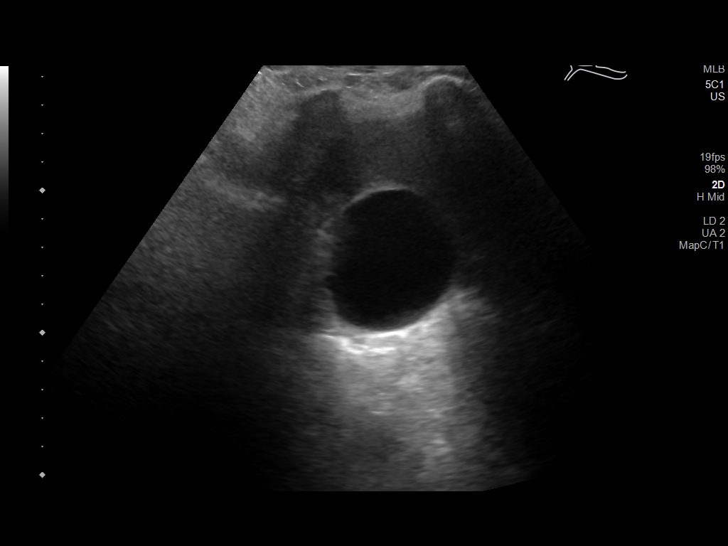
[im 22/58]
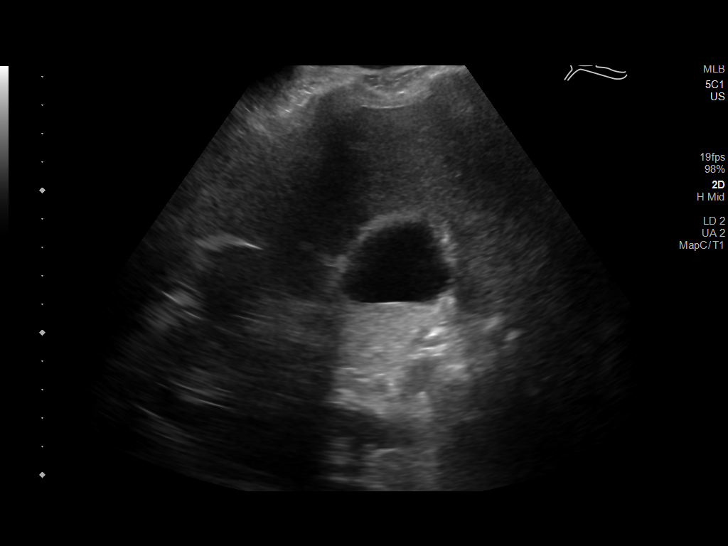
[im 27/58]
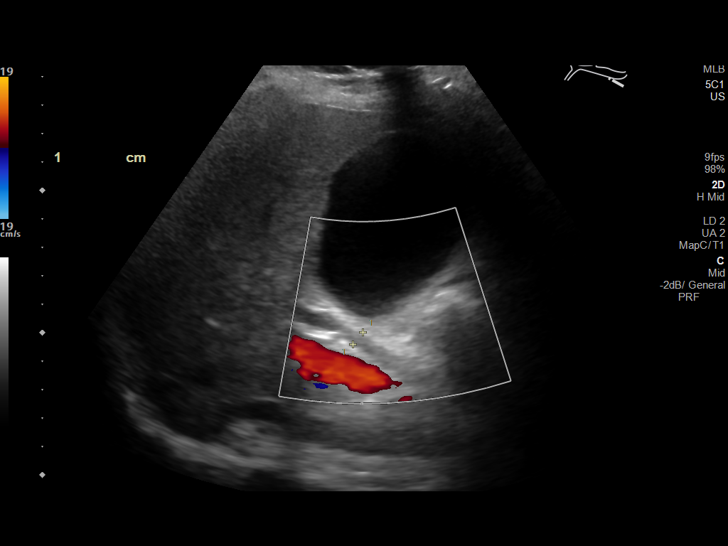
[im 31/58]
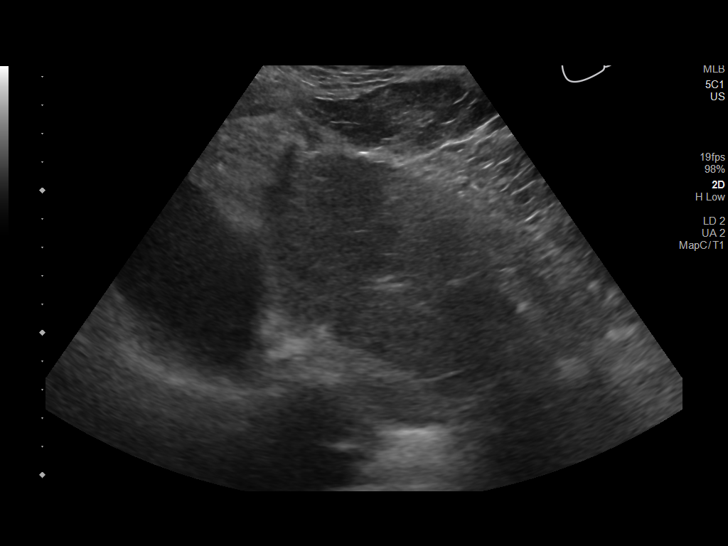
[im 36/58]
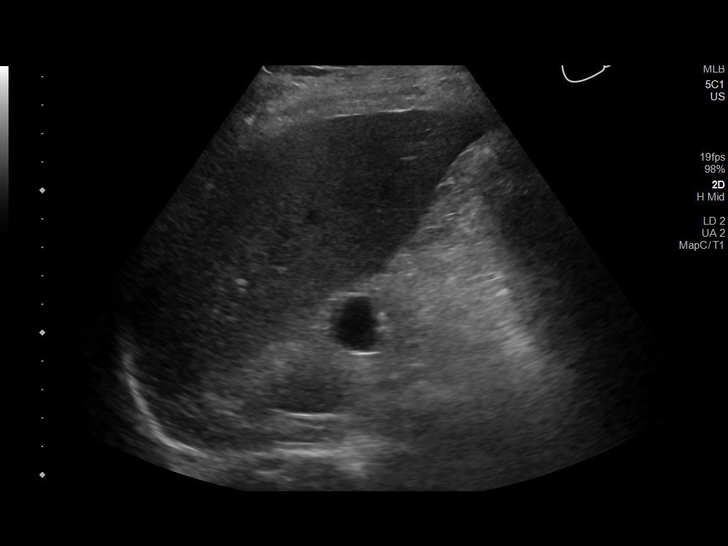
[im 39/58]
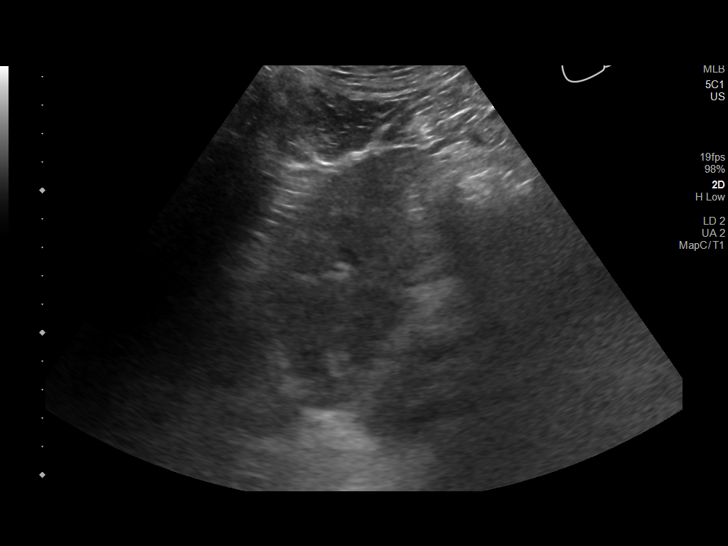
[im 43/58]
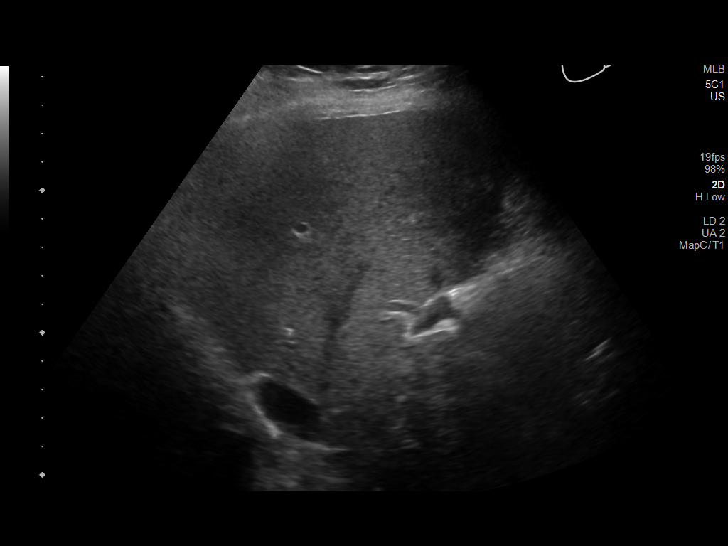
[im 48/58]
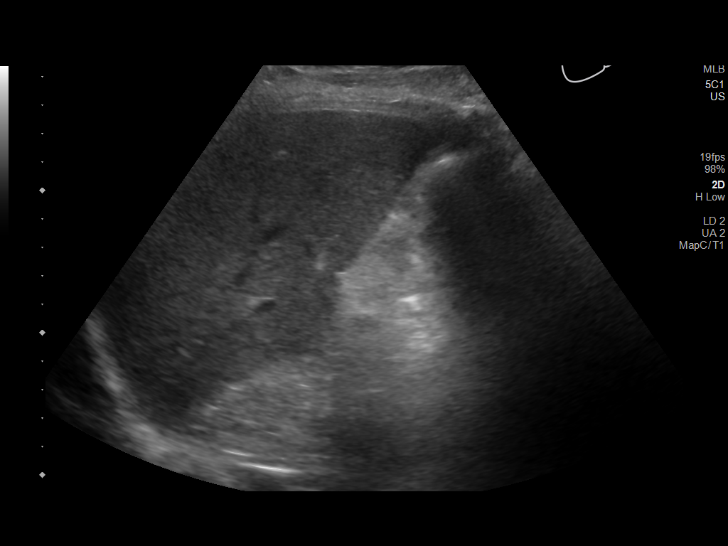
[im 53/58]
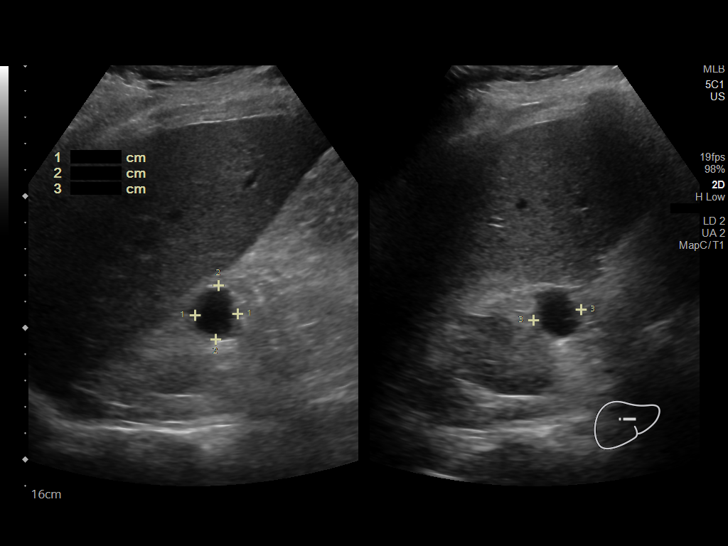
[im 58/58]
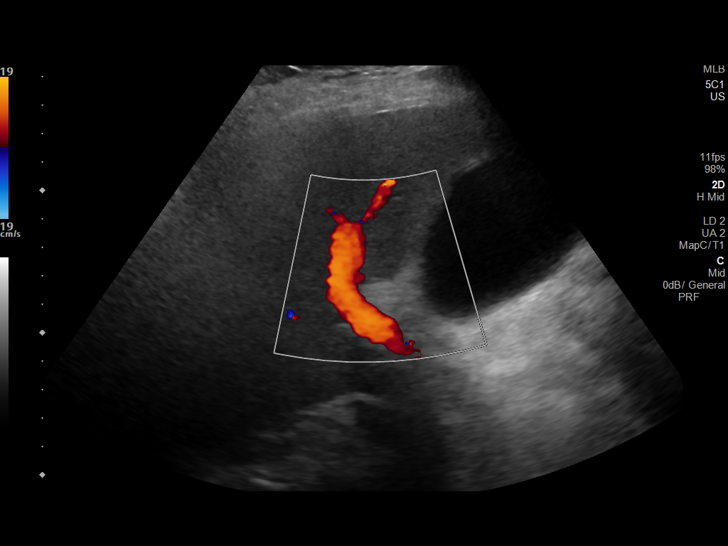

[14 of 25 positions shown; findings below may reference images not displayed]

FINDINGS: Gallbladder:

Nonshadowing layering echogenic material within the gallbladder
lumen most consistent with sludge. No frank shadowing
cholelithiasis. Gallbladder wall measures within normal limits of
1.8 mm. No free pericholecystic fluid. No sonographic Murphy sign
elicited on exam.

Common bile duct:

Diameter: 5.5 mm.  No choledocholithiasis.

Liver:

No focal lesion identified. Within normal limits in parenchymal
echogenicity. Portal vein is patent on color Doppler imaging with
normal direction of blood flow towards the liver.

Incidental note made of 2 simple right renal cysts measuring 1.6 x
2.1 x 1.9 cm and 3.7 x 3.5 x 2.9 cm.
IMPRESSION: 1. Gallbladder sludge without frank cholelithiasis. No sonographic
features to suggest acute cholecystitis.
2. No biliary dilatation or choledocholithiasis by ultrasound.
3. Incidental simple right renal cysts as above.

## 2020-07-10 DIAGNOSIS — M069 Rheumatoid arthritis, unspecified: Secondary | ICD-10-CM | POA: Diagnosis not present

## 2020-07-10 DIAGNOSIS — E7800 Pure hypercholesterolemia, unspecified: Secondary | ICD-10-CM | POA: Diagnosis not present

## 2020-07-10 DIAGNOSIS — I10 Essential (primary) hypertension: Secondary | ICD-10-CM | POA: Diagnosis not present

## 2020-07-10 DIAGNOSIS — G8929 Other chronic pain: Secondary | ICD-10-CM | POA: Diagnosis not present

## 2020-07-10 DIAGNOSIS — G4700 Insomnia, unspecified: Secondary | ICD-10-CM | POA: Diagnosis not present

## 2020-08-10 ENCOUNTER — Encounter: Payer: Self-pay | Admitting: Cardiology

## 2020-08-18 DIAGNOSIS — M25551 Pain in right hip: Secondary | ICD-10-CM | POA: Diagnosis not present

## 2020-08-18 DIAGNOSIS — L853 Xerosis cutis: Secondary | ICD-10-CM | POA: Diagnosis not present

## 2020-08-18 DIAGNOSIS — L4050 Arthropathic psoriasis, unspecified: Secondary | ICD-10-CM | POA: Diagnosis not present

## 2020-08-18 DIAGNOSIS — N1831 Chronic kidney disease, stage 3a: Secondary | ICD-10-CM | POA: Diagnosis not present

## 2020-08-18 DIAGNOSIS — M1990 Unspecified osteoarthritis, unspecified site: Secondary | ICD-10-CM | POA: Diagnosis not present

## 2020-08-18 DIAGNOSIS — L409 Psoriasis, unspecified: Secondary | ICD-10-CM | POA: Diagnosis not present

## 2020-08-18 DIAGNOSIS — M810 Age-related osteoporosis without current pathological fracture: Secondary | ICD-10-CM | POA: Diagnosis not present

## 2020-08-18 DIAGNOSIS — R768 Other specified abnormal immunological findings in serum: Secondary | ICD-10-CM | POA: Diagnosis not present

## 2020-08-18 DIAGNOSIS — K7460 Unspecified cirrhosis of liver: Secondary | ICD-10-CM | POA: Diagnosis not present

## 2020-08-18 DIAGNOSIS — Z79899 Other long term (current) drug therapy: Secondary | ICD-10-CM | POA: Diagnosis not present

## 2020-08-18 DIAGNOSIS — M0609 Rheumatoid arthritis without rheumatoid factor, multiple sites: Secondary | ICD-10-CM | POA: Diagnosis not present

## 2020-08-31 DIAGNOSIS — I10 Essential (primary) hypertension: Secondary | ICD-10-CM | POA: Diagnosis not present

## 2020-08-31 DIAGNOSIS — K7469 Other cirrhosis of liver: Secondary | ICD-10-CM | POA: Diagnosis not present

## 2020-08-31 DIAGNOSIS — R7401 Elevation of levels of liver transaminase levels: Secondary | ICD-10-CM | POA: Diagnosis not present

## 2020-08-31 DIAGNOSIS — D696 Thrombocytopenia, unspecified: Secondary | ICD-10-CM | POA: Diagnosis not present

## 2020-08-31 DIAGNOSIS — R519 Headache, unspecified: Secondary | ICD-10-CM | POA: Diagnosis not present

## 2020-08-31 DIAGNOSIS — I700 Atherosclerosis of aorta: Secondary | ICD-10-CM | POA: Diagnosis not present

## 2020-09-05 DIAGNOSIS — E7800 Pure hypercholesterolemia, unspecified: Secondary | ICD-10-CM | POA: Diagnosis not present

## 2020-09-05 DIAGNOSIS — I10 Essential (primary) hypertension: Secondary | ICD-10-CM | POA: Diagnosis not present

## 2020-09-05 DIAGNOSIS — G4700 Insomnia, unspecified: Secondary | ICD-10-CM | POA: Diagnosis not present

## 2020-09-05 DIAGNOSIS — M069 Rheumatoid arthritis, unspecified: Secondary | ICD-10-CM | POA: Diagnosis not present

## 2020-09-05 DIAGNOSIS — G8929 Other chronic pain: Secondary | ICD-10-CM | POA: Diagnosis not present

## 2020-09-15 ENCOUNTER — Encounter: Payer: Self-pay | Admitting: Dermatology

## 2020-09-15 ENCOUNTER — Ambulatory Visit (INDEPENDENT_AMBULATORY_CARE_PROVIDER_SITE_OTHER): Payer: Medicare Other | Admitting: Dermatology

## 2020-09-15 ENCOUNTER — Other Ambulatory Visit: Payer: Self-pay

## 2020-09-15 DIAGNOSIS — L400 Psoriasis vulgaris: Secondary | ICD-10-CM | POA: Diagnosis not present

## 2020-09-17 ENCOUNTER — Encounter: Payer: Self-pay | Admitting: Dermatology

## 2020-09-17 ENCOUNTER — Telehealth: Payer: Self-pay | Admitting: Dermatology

## 2020-09-17 NOTE — Progress Notes (Signed)
   Follow-Up Visit   Subjective  [name] is a 87 y.o. male who presents for the following: Psoriasis (Patient was put on mtx and it kept him clear but due to liver levels d/c drug. Rheumatology Dr put him on enbrel was working but now he is flared left lower leg. Last Enbrel injection was yesterday daughter is with him again today to translate.).  Psoriasis Location: Was extensive  but just now starting to flare while on Enbrel Duration:  Quality:  Associated Signs/Symptoms: Modifying Factors: Enbrel from his rheumatologist Severity:  Timing: Context:   Objective  Well appearing patient in no apparent distress; mood and affect are within normal limits. Objective  Left Instep: Clearly inflammatory large plaque psoriasis most severe on distal lower extremities but historically has been more extensive and is just now flaring while on Enbrel.  Also states that he is still getting joint stiffness and pain while on Enbrel.  Images      A focused examination was performed including Arms, legs, nails, joints.. Relevant physical exam findings are noted in the Assessment and Plan.   Assessment & Plan    Plaque psoriasis Left Instep  We will try to switch [name] self from Enbrel to one of the newer Biologics.  My first choice would be Skyrizi but we will try and obtain preapproval.      I, Lavonna Monarch, MD, have reviewed all documentation for this visit.  The documentation on 09/17/20 for the exam, diagnosis, procedures, and orders are all accurate and complete.

## 2020-09-17 NOTE — Telephone Encounter (Signed)
TB updated.

## 2020-09-17 NOTE — Telephone Encounter (Signed)
[facility] called. His last TB test was on 10/18/19 and was negative

## 2020-09-18 DIAGNOSIS — G8929 Other chronic pain: Secondary | ICD-10-CM | POA: Diagnosis not present

## 2020-09-18 DIAGNOSIS — E7800 Pure hypercholesterolemia, unspecified: Secondary | ICD-10-CM | POA: Diagnosis not present

## 2020-09-18 DIAGNOSIS — M069 Rheumatoid arthritis, unspecified: Secondary | ICD-10-CM | POA: Diagnosis not present

## 2020-09-18 DIAGNOSIS — I10 Essential (primary) hypertension: Secondary | ICD-10-CM | POA: Diagnosis not present

## 2020-09-18 DIAGNOSIS — G4700 Insomnia, unspecified: Secondary | ICD-10-CM | POA: Diagnosis not present

## 2020-09-21 ENCOUNTER — Telehealth: Payer: Self-pay | Admitting: Dermatology

## 2020-09-21 NOTE — Telephone Encounter (Signed)
Daughter says they're waiting for approval of med Orson Ape), but itching is so severe he can't sleep at night. Wants to know if something can be recommended or Rx'd until then, Pharmacy: [facility]. [facility] patient.

## 2020-09-23 ENCOUNTER — Telehealth: Payer: Self-pay | Admitting: Dermatology

## 2020-09-23 NOTE — Telephone Encounter (Signed)
If either we have for [name] can provide [place] with 2 Skyrizi samples, we will do the first 2 injections here while awaiting preapproval.

## 2020-09-23 NOTE — Telephone Encounter (Signed)
ST patient. Daughter [name] called.Psoriasis is getting much worse; can't sleep at night; wants to know if there's anything else he can use in the meantime until med prior auth goes through.  "It's really bad.I called Monday and no one called me back."

## 2020-09-23 NOTE — Telephone Encounter (Signed)
Daughter [name] calling back wanting to know where we were in the process of skyrizi.  Before picking up the phone with her I called [employer] line to see where we were in the process.  Skyrizi requires Prior Authorization- states they will contact us if they needed any further information. I picked up the phone with daughter and gave her the exact information that [employer] told me.  I also informed her that we don't have sample of skyrizi.

## 2020-09-29 ENCOUNTER — Telehealth: Payer: Self-pay | Admitting: *Deleted

## 2020-09-29 NOTE — Telephone Encounter (Signed)
Received fax from [facility] stating they have contacted patient and delivery date scheduled for 09/29/2020 for skyrizi injection

## 2020-10-06 ENCOUNTER — Telehealth: Payer: Self-pay | Admitting: Dermatology

## 2020-10-06 NOTE — Telephone Encounter (Signed)
Instructed daughter to follow the intial and maintaince dose for skyrizi. Patient states that she doesn't have medicine told her she has to call the pharmacy to get the medicine.

## 2020-10-06 NOTE — Telephone Encounter (Signed)
Daughter says he took 1 injection of Skyrizi and wants to know what next step is.

## 2020-11-10 DIAGNOSIS — M069 Rheumatoid arthritis, unspecified: Secondary | ICD-10-CM | POA: Diagnosis not present

## 2020-11-10 DIAGNOSIS — E7800 Pure hypercholesterolemia, unspecified: Secondary | ICD-10-CM | POA: Diagnosis not present

## 2020-11-10 DIAGNOSIS — G4700 Insomnia, unspecified: Secondary | ICD-10-CM | POA: Diagnosis not present

## 2020-11-10 DIAGNOSIS — G8929 Other chronic pain: Secondary | ICD-10-CM | POA: Diagnosis not present

## 2020-11-10 DIAGNOSIS — I10 Essential (primary) hypertension: Secondary | ICD-10-CM | POA: Diagnosis not present

## 2020-12-04 DIAGNOSIS — I10 Essential (primary) hypertension: Secondary | ICD-10-CM | POA: Diagnosis not present

## 2020-12-04 DIAGNOSIS — L4050 Arthropathic psoriasis, unspecified: Secondary | ICD-10-CM | POA: Diagnosis not present

## 2020-12-04 DIAGNOSIS — R1310 Dysphagia, unspecified: Secondary | ICD-10-CM | POA: Diagnosis not present

## 2020-12-09 DIAGNOSIS — M069 Rheumatoid arthritis, unspecified: Secondary | ICD-10-CM | POA: Diagnosis not present

## 2020-12-09 DIAGNOSIS — L4050 Arthropathic psoriasis, unspecified: Secondary | ICD-10-CM | POA: Diagnosis not present

## 2020-12-09 DIAGNOSIS — E7800 Pure hypercholesterolemia, unspecified: Secondary | ICD-10-CM | POA: Diagnosis not present

## 2020-12-09 DIAGNOSIS — I10 Essential (primary) hypertension: Secondary | ICD-10-CM | POA: Diagnosis not present

## 2020-12-15 ENCOUNTER — Other Ambulatory Visit (HOSPITAL_COMMUNITY): Payer: Self-pay | Admitting: *Deleted

## 2020-12-15 DIAGNOSIS — R1310 Dysphagia, unspecified: Secondary | ICD-10-CM

## 2020-12-22 DIAGNOSIS — M810 Age-related osteoporosis without current pathological fracture: Secondary | ICD-10-CM | POA: Diagnosis not present

## 2020-12-22 DIAGNOSIS — M1990 Unspecified osteoarthritis, unspecified site: Secondary | ICD-10-CM | POA: Diagnosis not present

## 2020-12-22 DIAGNOSIS — Z79899 Other long term (current) drug therapy: Secondary | ICD-10-CM | POA: Diagnosis not present

## 2020-12-22 DIAGNOSIS — K7460 Unspecified cirrhosis of liver: Secondary | ICD-10-CM | POA: Diagnosis not present

## 2020-12-23 ENCOUNTER — Ambulatory Visit (HOSPITAL_COMMUNITY)
Admission: RE | Admit: 2020-12-23 | Discharge: 2020-12-23 | Disposition: A | Discharge status: Home / Self Care | Payer: Medicare Other | Source: Ambulatory Visit | Attending: Geriatric Medicine | Admitting: Geriatric Medicine

## 2020-12-23 ENCOUNTER — Other Ambulatory Visit: Payer: Self-pay

## 2020-12-23 DIAGNOSIS — R1310 Dysphagia, unspecified: Secondary | ICD-10-CM | POA: Diagnosis not present

## 2020-12-23 DIAGNOSIS — K219 Gastro-esophageal reflux disease without esophagitis: Secondary | ICD-10-CM | POA: Diagnosis not present

## 2020-12-23 DIAGNOSIS — R1319 Other dysphagia: Secondary | ICD-10-CM | POA: Insufficient documentation

## 2021-01-06 DIAGNOSIS — L4050 Arthropathic psoriasis, unspecified: Secondary | ICD-10-CM | POA: Diagnosis not present

## 2021-01-06 DIAGNOSIS — I10 Essential (primary) hypertension: Secondary | ICD-10-CM | POA: Diagnosis not present

## 2021-01-06 DIAGNOSIS — M069 Rheumatoid arthritis, unspecified: Secondary | ICD-10-CM | POA: Diagnosis not present

## 2021-01-06 DIAGNOSIS — E7800 Pure hypercholesterolemia, unspecified: Secondary | ICD-10-CM | POA: Diagnosis not present

## 2021-02-04 DIAGNOSIS — I10 Essential (primary) hypertension: Secondary | ICD-10-CM | POA: Diagnosis not present

## 2021-02-04 DIAGNOSIS — M069 Rheumatoid arthritis, unspecified: Secondary | ICD-10-CM | POA: Diagnosis not present

## 2021-02-04 DIAGNOSIS — L4050 Arthropathic psoriasis, unspecified: Secondary | ICD-10-CM | POA: Diagnosis not present

## 2021-02-04 DIAGNOSIS — E7800 Pure hypercholesterolemia, unspecified: Secondary | ICD-10-CM | POA: Diagnosis not present

## 2021-03-04 DIAGNOSIS — L4050 Arthropathic psoriasis, unspecified: Secondary | ICD-10-CM | POA: Diagnosis not present

## 2021-03-04 DIAGNOSIS — I10 Essential (primary) hypertension: Secondary | ICD-10-CM | POA: Diagnosis not present

## 2021-03-04 DIAGNOSIS — M069 Rheumatoid arthritis, unspecified: Secondary | ICD-10-CM | POA: Diagnosis not present

## 2021-03-04 DIAGNOSIS — E7800 Pure hypercholesterolemia, unspecified: Secondary | ICD-10-CM | POA: Diagnosis not present

## 2021-03-09 ENCOUNTER — Telehealth: Payer: Self-pay | Admitting: *Deleted

## 2021-03-09 MED ORDER — SKYRIZI PEN 150 MG/ML ~~LOC~~ SOAJ
150.0000 mg | SUBCUTANEOUS | 3 refills | Status: DC
Start: 2021-03-09 — End: 2022-02-02

## 2021-03-09 NOTE — Telephone Encounter (Signed)
Patients daughter is calling for number to [facility] for her dads skyrizi. I gave her phone number and sent in new prescription. She is to call back with any problems or concerns.

## 2021-04-04 DIAGNOSIS — M069 Rheumatoid arthritis, unspecified: Secondary | ICD-10-CM | POA: Diagnosis not present

## 2021-04-04 DIAGNOSIS — I10 Essential (primary) hypertension: Secondary | ICD-10-CM | POA: Diagnosis not present

## 2021-04-04 DIAGNOSIS — L4050 Arthropathic psoriasis, unspecified: Secondary | ICD-10-CM | POA: Diagnosis not present

## 2021-04-04 DIAGNOSIS — E7800 Pure hypercholesterolemia, unspecified: Secondary | ICD-10-CM | POA: Diagnosis not present

## 2021-04-05 DIAGNOSIS — G4452 New daily persistent headache (NDPH): Secondary | ICD-10-CM | POA: Diagnosis not present

## 2021-04-28 DIAGNOSIS — I350 Nonrheumatic aortic (valve) stenosis: Secondary | ICD-10-CM | POA: Diagnosis not present

## 2021-04-28 DIAGNOSIS — L4050 Arthropathic psoriasis, unspecified: Secondary | ICD-10-CM | POA: Diagnosis not present

## 2021-04-28 DIAGNOSIS — I10 Essential (primary) hypertension: Secondary | ICD-10-CM | POA: Diagnosis not present

## 2021-04-28 DIAGNOSIS — K7460 Unspecified cirrhosis of liver: Secondary | ICD-10-CM | POA: Diagnosis not present

## 2021-04-28 DIAGNOSIS — I700 Atherosclerosis of aorta: Secondary | ICD-10-CM | POA: Diagnosis not present

## 2021-04-28 DIAGNOSIS — Z0000 Encounter for general adult medical examination without abnormal findings: Secondary | ICD-10-CM | POA: Diagnosis not present

## 2021-04-28 DIAGNOSIS — E7800 Pure hypercholesterolemia, unspecified: Secondary | ICD-10-CM | POA: Diagnosis not present

## 2021-04-28 DIAGNOSIS — I351 Nonrheumatic aortic (valve) insufficiency: Secondary | ICD-10-CM | POA: Diagnosis not present

## 2021-04-28 DIAGNOSIS — E041 Nontoxic single thyroid nodule: Secondary | ICD-10-CM | POA: Diagnosis not present

## 2021-04-28 DIAGNOSIS — Z79899 Other long term (current) drug therapy: Secondary | ICD-10-CM | POA: Diagnosis not present

## 2021-04-28 DIAGNOSIS — K219 Gastro-esophageal reflux disease without esophagitis: Secondary | ICD-10-CM | POA: Diagnosis not present

## 2021-04-28 DIAGNOSIS — Z23 Encounter for immunization: Secondary | ICD-10-CM | POA: Diagnosis not present

## 2021-04-28 DIAGNOSIS — Z1389 Encounter for screening for other disorder: Secondary | ICD-10-CM | POA: Diagnosis not present

## 2021-04-28 DIAGNOSIS — D696 Thrombocytopenia, unspecified: Secondary | ICD-10-CM | POA: Diagnosis not present

## 2021-04-29 ENCOUNTER — Other Ambulatory Visit (HOSPITAL_COMMUNITY): Payer: Self-pay | Admitting: Geriatric Medicine

## 2021-04-29 DIAGNOSIS — I350 Nonrheumatic aortic (valve) stenosis: Secondary | ICD-10-CM

## 2021-05-18 ENCOUNTER — Other Ambulatory Visit: Payer: Self-pay

## 2021-05-18 ENCOUNTER — Ambulatory Visit (HOSPITAL_COMMUNITY): Payer: Medicare Other | Attending: Cardiology

## 2021-05-18 DIAGNOSIS — I350 Nonrheumatic aortic (valve) stenosis: Secondary | ICD-10-CM | POA: Insufficient documentation

## 2021-05-18 LAB — ECHOCARDIOGRAM COMPLETE
AR max vel: 1.2 cm2
AV Area VTI: 1.28 cm2
AV Area mean vel: 1.21 cm2
AV Mean grad: 18 mmHg
AV Peak grad: 33.1 mmHg
Ao pk vel: 2.88 m/s
Area-P 1/2: 2.61 cm2
P 1/2 time: 406 msec
S' Lateral: 1.9 cm

## 2021-05-19 DIAGNOSIS — R21 Rash and other nonspecific skin eruption: Secondary | ICD-10-CM | POA: Diagnosis not present

## 2021-06-08 NOTE — Telephone Encounter (Signed)
Update per [employer] 06/08/21 patient copay 0$ for skyrizi and agreed delivery date of 06/09/21

## 2021-07-16 DIAGNOSIS — H35371 Puckering of macula, right eye: Secondary | ICD-10-CM | POA: Diagnosis not present

## 2021-07-16 DIAGNOSIS — H43813 Vitreous degeneration, bilateral: Secondary | ICD-10-CM | POA: Diagnosis not present

## 2021-07-16 DIAGNOSIS — H5213 Myopia, bilateral: Secondary | ICD-10-CM | POA: Diagnosis not present

## 2021-07-16 DIAGNOSIS — H26491 Other secondary cataract, right eye: Secondary | ICD-10-CM | POA: Diagnosis not present

## 2021-07-21 ENCOUNTER — Ambulatory Visit: Payer: Medicare Other | Admitting: Dermatology

## 2021-08-20 DIAGNOSIS — I10 Essential (primary) hypertension: Secondary | ICD-10-CM | POA: Diagnosis not present

## 2021-08-20 DIAGNOSIS — R269 Unspecified abnormalities of gait and mobility: Secondary | ICD-10-CM | POA: Diagnosis not present

## 2021-08-24 ENCOUNTER — Telehealth: Payer: Self-pay

## 2021-08-24 NOTE — Telephone Encounter (Signed)
Fax received from [facility] stating that they have contacted the patient and his Orson Ape will be delivered to him at no cost.  ?

## 2021-09-09 DIAGNOSIS — Z79899 Other long term (current) drug therapy: Secondary | ICD-10-CM | POA: Diagnosis not present

## 2021-09-09 DIAGNOSIS — L4050 Arthropathic psoriasis, unspecified: Secondary | ICD-10-CM | POA: Diagnosis not present

## 2021-09-09 DIAGNOSIS — M810 Age-related osteoporosis without current pathological fracture: Secondary | ICD-10-CM | POA: Diagnosis not present

## 2021-09-09 DIAGNOSIS — M1990 Unspecified osteoarthritis, unspecified site: Secondary | ICD-10-CM | POA: Diagnosis not present

## 2021-09-09 DIAGNOSIS — M549 Dorsalgia, unspecified: Secondary | ICD-10-CM | POA: Diagnosis not present

## 2021-09-10 DIAGNOSIS — I10 Essential (primary) hypertension: Secondary | ICD-10-CM | POA: Diagnosis not present

## 2021-09-10 DIAGNOSIS — E7800 Pure hypercholesterolemia, unspecified: Secondary | ICD-10-CM | POA: Diagnosis not present

## 2021-09-13 DIAGNOSIS — S52514A Nondisplaced fracture of right radial styloid process, initial encounter for closed fracture: Secondary | ICD-10-CM | POA: Diagnosis not present

## 2021-09-13 DIAGNOSIS — M79641 Pain in right hand: Secondary | ICD-10-CM | POA: Diagnosis not present

## 2021-09-13 DIAGNOSIS — S52124A Nondisplaced fracture of head of right radius, initial encounter for closed fracture: Secondary | ICD-10-CM | POA: Diagnosis not present

## 2021-09-14 ENCOUNTER — Ambulatory Visit
Admission: RE | Admit: 2021-09-14 | Discharge: 2021-09-14 | Disposition: A | Discharge status: Home / Self Care | Payer: Medicare Other | Source: Ambulatory Visit | Attending: Internal Medicine | Admitting: Internal Medicine

## 2021-09-14 ENCOUNTER — Other Ambulatory Visit: Payer: Self-pay | Admitting: Internal Medicine

## 2021-09-14 DIAGNOSIS — R269 Unspecified abnormalities of gait and mobility: Secondary | ICD-10-CM | POA: Diagnosis not present

## 2021-09-14 DIAGNOSIS — R0781 Pleurodynia: Secondary | ICD-10-CM | POA: Diagnosis not present

## 2021-09-14 DIAGNOSIS — S2241XA Multiple fractures of ribs, right side, initial encounter for closed fracture: Secondary | ICD-10-CM | POA: Diagnosis not present

## 2021-09-14 DIAGNOSIS — Z9049 Acquired absence of other specified parts of digestive tract: Secondary | ICD-10-CM | POA: Diagnosis not present

## 2021-09-14 DIAGNOSIS — R052 Subacute cough: Secondary | ICD-10-CM | POA: Diagnosis not present

## 2021-09-28 DIAGNOSIS — S52124A Nondisplaced fracture of head of right radius, initial encounter for closed fracture: Secondary | ICD-10-CM | POA: Diagnosis not present

## 2021-09-28 DIAGNOSIS — S52514A Nondisplaced fracture of right radial styloid process, initial encounter for closed fracture: Secondary | ICD-10-CM | POA: Diagnosis not present

## 2021-09-29 ENCOUNTER — Ambulatory Visit (INDEPENDENT_AMBULATORY_CARE_PROVIDER_SITE_OTHER): Payer: Medicare Other | Admitting: Dermatology

## 2021-09-29 ENCOUNTER — Encounter: Payer: Self-pay | Admitting: Dermatology

## 2021-09-29 DIAGNOSIS — L400 Psoriasis vulgaris: Secondary | ICD-10-CM

## 2021-09-29 DIAGNOSIS — S52124A Nondisplaced fracture of head of right radius, initial encounter for closed fracture: Secondary | ICD-10-CM | POA: Diagnosis not present

## 2021-09-29 DIAGNOSIS — S52124D Nondisplaced fracture of head of right radius, subsequent encounter for closed fracture with routine healing: Secondary | ICD-10-CM | POA: Diagnosis not present

## 2021-09-29 DIAGNOSIS — Z79899 Other long term (current) drug therapy: Secondary | ICD-10-CM | POA: Diagnosis not present

## 2021-09-29 DIAGNOSIS — S52514A Nondisplaced fracture of right radial styloid process, initial encounter for closed fracture: Secondary | ICD-10-CM | POA: Diagnosis not present

## 2021-09-29 MED ORDER — CLOBETASOL PROPIONATE 0.05 % EX CREA: 1.0000 "application " | TOPICAL_CREAM | Freq: Two times a day (BID) | CUTANEOUS | 5 refills | Status: AC

## 2021-09-30 DIAGNOSIS — Z79899 Other long term (current) drug therapy: Secondary | ICD-10-CM | POA: Diagnosis not present

## 2021-09-30 DIAGNOSIS — L400 Psoriasis vulgaris: Secondary | ICD-10-CM | POA: Diagnosis not present

## 2021-10-02 LAB — QUANTIFERON-TB GOLD PLUS
Mitogen-NIL: >10 IU/mL
NIL: 0.37 IU/mL
QuantiFERON-TB Gold Plus: POSITIVE — AB
TB1-NIL: 1.61 IU/mL
TB2-NIL: 2.06 IU/mL

## 2021-10-04 ENCOUNTER — Telehealth: Payer: Self-pay | Admitting: *Deleted

## 2021-10-04 NOTE — Telephone Encounter (Signed)
Left patient a message to call back for results.  ?

## 2021-10-04 NOTE — Telephone Encounter (Signed)
-----   Message from Lavonna Monarch, MD sent at 10/03/2021  5:36 AM EDT ----- ?I could not find any record in the past 2 years of previous tuberculosis testing or chest x-ray.  Please let English-speaking member of his family know that he should be seen by his primary care doctor to determine the next step relating to this abnormal lab value. ?

## 2021-10-05 DIAGNOSIS — K7460 Unspecified cirrhosis of liver: Secondary | ICD-10-CM | POA: Diagnosis not present

## 2021-10-05 DIAGNOSIS — R7303 Prediabetes: Secondary | ICD-10-CM | POA: Diagnosis not present

## 2021-10-05 DIAGNOSIS — R1319 Other dysphagia: Secondary | ICD-10-CM | POA: Diagnosis not present

## 2021-10-05 DIAGNOSIS — K219 Gastro-esophageal reflux disease without esophagitis: Secondary | ICD-10-CM | POA: Diagnosis not present

## 2021-10-05 DIAGNOSIS — I700 Atherosclerosis of aorta: Secondary | ICD-10-CM | POA: Diagnosis not present

## 2021-10-05 DIAGNOSIS — L4050 Arthropathic psoriasis, unspecified: Secondary | ICD-10-CM | POA: Diagnosis not present

## 2021-10-05 DIAGNOSIS — E611 Iron deficiency: Secondary | ICD-10-CM | POA: Diagnosis not present

## 2021-10-05 DIAGNOSIS — Z87891 Personal history of nicotine dependence: Secondary | ICD-10-CM | POA: Diagnosis not present

## 2021-10-05 DIAGNOSIS — K4090 Unilateral inguinal hernia, without obstruction or gangrene, not specified as recurrent: Secondary | ICD-10-CM | POA: Diagnosis not present

## 2021-10-05 DIAGNOSIS — E890 Postprocedural hypothyroidism: Secondary | ICD-10-CM | POA: Diagnosis not present

## 2021-10-05 DIAGNOSIS — M0609 Rheumatoid arthritis without rheumatoid factor, multiple sites: Secondary | ICD-10-CM | POA: Diagnosis not present

## 2021-10-05 DIAGNOSIS — Z952 Presence of prosthetic heart valve: Secondary | ICD-10-CM | POA: Diagnosis not present

## 2021-10-05 DIAGNOSIS — D696 Thrombocytopenia, unspecified: Secondary | ICD-10-CM | POA: Diagnosis not present

## 2021-10-05 DIAGNOSIS — S62109D Fracture of unspecified carpal bone, unspecified wrist, subsequent encounter for fracture with routine healing: Secondary | ICD-10-CM | POA: Diagnosis not present

## 2021-10-05 DIAGNOSIS — E7800 Pure hypercholesterolemia, unspecified: Secondary | ICD-10-CM | POA: Diagnosis not present

## 2021-10-05 DIAGNOSIS — Z86718 Personal history of other venous thrombosis and embolism: Secondary | ICD-10-CM | POA: Diagnosis not present

## 2021-10-05 DIAGNOSIS — Z9181 History of falling: Secondary | ICD-10-CM | POA: Diagnosis not present

## 2021-10-05 DIAGNOSIS — I10 Essential (primary) hypertension: Secondary | ICD-10-CM | POA: Diagnosis not present

## 2021-10-05 NOTE — Telephone Encounter (Signed)
Patient daughter [name] aware of the positive TB test and will follow up with PCP DR Felipa Eth. Also they are aware of the hold on the skyrizi pen until we get him cleared of the TB dx. ?

## 2021-10-08 DIAGNOSIS — M069 Rheumatoid arthritis, unspecified: Secondary | ICD-10-CM | POA: Diagnosis not present

## 2021-10-08 DIAGNOSIS — I700 Atherosclerosis of aorta: Secondary | ICD-10-CM | POA: Diagnosis not present

## 2021-10-08 DIAGNOSIS — E7800 Pure hypercholesterolemia, unspecified: Secondary | ICD-10-CM | POA: Diagnosis not present

## 2021-10-08 DIAGNOSIS — I10 Essential (primary) hypertension: Secondary | ICD-10-CM | POA: Diagnosis not present

## 2021-10-08 DIAGNOSIS — Z79899 Other long term (current) drug therapy: Secondary | ICD-10-CM | POA: Diagnosis not present

## 2021-10-11 DIAGNOSIS — R413 Other amnesia: Secondary | ICD-10-CM | POA: Diagnosis not present

## 2021-10-12 DIAGNOSIS — R739 Hyperglycemia, unspecified: Secondary | ICD-10-CM | POA: Diagnosis not present

## 2021-10-13 DIAGNOSIS — R7303 Prediabetes: Secondary | ICD-10-CM | POA: Diagnosis not present

## 2021-10-13 DIAGNOSIS — Z952 Presence of prosthetic heart valve: Secondary | ICD-10-CM | POA: Diagnosis not present

## 2021-10-13 DIAGNOSIS — R1319 Other dysphagia: Secondary | ICD-10-CM | POA: Diagnosis not present

## 2021-10-13 DIAGNOSIS — E890 Postprocedural hypothyroidism: Secondary | ICD-10-CM | POA: Diagnosis not present

## 2021-10-13 DIAGNOSIS — K4090 Unilateral inguinal hernia, without obstruction or gangrene, not specified as recurrent: Secondary | ICD-10-CM | POA: Diagnosis not present

## 2021-10-13 DIAGNOSIS — D696 Thrombocytopenia, unspecified: Secondary | ICD-10-CM | POA: Diagnosis not present

## 2021-10-13 DIAGNOSIS — Z9181 History of falling: Secondary | ICD-10-CM | POA: Diagnosis not present

## 2021-10-13 DIAGNOSIS — L4050 Arthropathic psoriasis, unspecified: Secondary | ICD-10-CM | POA: Diagnosis not present

## 2021-10-13 DIAGNOSIS — I10 Essential (primary) hypertension: Secondary | ICD-10-CM | POA: Diagnosis not present

## 2021-10-13 DIAGNOSIS — S62109D Fracture of unspecified carpal bone, unspecified wrist, subsequent encounter for fracture with routine healing: Secondary | ICD-10-CM | POA: Diagnosis not present

## 2021-10-13 DIAGNOSIS — K7460 Unspecified cirrhosis of liver: Secondary | ICD-10-CM | POA: Diagnosis not present

## 2021-10-13 DIAGNOSIS — E611 Iron deficiency: Secondary | ICD-10-CM | POA: Diagnosis not present

## 2021-10-13 DIAGNOSIS — Z86718 Personal history of other venous thrombosis and embolism: Secondary | ICD-10-CM | POA: Diagnosis not present

## 2021-10-13 DIAGNOSIS — I700 Atherosclerosis of aorta: Secondary | ICD-10-CM | POA: Diagnosis not present

## 2021-10-13 DIAGNOSIS — E7800 Pure hypercholesterolemia, unspecified: Secondary | ICD-10-CM | POA: Diagnosis not present

## 2021-10-13 DIAGNOSIS — K219 Gastro-esophageal reflux disease without esophagitis: Secondary | ICD-10-CM | POA: Diagnosis not present

## 2021-10-13 DIAGNOSIS — M0609 Rheumatoid arthritis without rheumatoid factor, multiple sites: Secondary | ICD-10-CM | POA: Diagnosis not present

## 2021-10-13 DIAGNOSIS — Z87891 Personal history of nicotine dependence: Secondary | ICD-10-CM | POA: Diagnosis not present

## 2021-10-17 ENCOUNTER — Encounter: Payer: Self-pay | Admitting: Dermatology

## 2021-10-17 NOTE — Progress Notes (Signed)
? ?  Follow-Up Visit ?  ?Subjective  ?[name] is a 88 y.o. male who presents for the following: Rash (Pt states he has rash on left and right foot x year. Orson Ape has helped. Pt would like a topical. ). ? ?Psoriasis, most severe on feet ?Location:  ?Duration:  ?Quality:  ?Associated Signs/Symptoms: ?Modifying Factors:  ?Severity:  ?Timing: ?Context:  ? ?Objective  ?Well appearing patient in no apparent distress; mood and affect are within normal limits. ?Left Instep, Left Middle Plantar Surface, Right Instep, Right Middle Plantar Surface ?Interpreter plus daughter in room throughout visit.  Patient is generally had 80+ percent clearance with good functionality on Skyrizi, but would like to add a topical.  We also discussed alternative systemic agents. ? ? ? ?A focused examination was performed including head, neck, hands, feet, nails. Relevant physical exam findings are noted in the Assessment and Plan. ? ? ?Assessment & Plan  ? ? ?Psoriasis vulgaris ?Left Instep; Right Instep; Left Middle Plantar Surface; Right Middle Plantar Surface ? ?Continue skyrizi.  May add topical clobetasol with optional moist wrap for 20 to 30 minutes daily or less often.  Update TB testing. ? ?QuantiFERON-TB Gold Plus - Left Instep, Left Middle Plantar Surface, Right Instep, Right Middle Plantar Surface ? ?clobetasol cream (TEMOVATE) 0.05 % - Left Instep, Left Middle Plantar Surface, Right Instep, Right Middle Plantar Surface ?Apply 1 application. topically 2 (two) times daily. ? ?Encounter for long-term (current) use of medications ? ?Related Procedures ?QuantiFERON-TB Gold Plus ? ? ? ? ? ?I, Lavonna Monarch, MD, have reviewed all documentation for this visit.  The documentation on 10/17/21 for the exam, diagnosis, procedures, and orders are all accurate and complete. ?

## 2021-10-18 DIAGNOSIS — Z86718 Personal history of other venous thrombosis and embolism: Secondary | ICD-10-CM | POA: Diagnosis not present

## 2021-10-18 DIAGNOSIS — M0609 Rheumatoid arthritis without rheumatoid factor, multiple sites: Secondary | ICD-10-CM | POA: Diagnosis not present

## 2021-10-18 DIAGNOSIS — K7460 Unspecified cirrhosis of liver: Secondary | ICD-10-CM | POA: Diagnosis not present

## 2021-10-18 DIAGNOSIS — E611 Iron deficiency: Secondary | ICD-10-CM | POA: Diagnosis not present

## 2021-10-18 DIAGNOSIS — K219 Gastro-esophageal reflux disease without esophagitis: Secondary | ICD-10-CM | POA: Diagnosis not present

## 2021-10-18 DIAGNOSIS — L4050 Arthropathic psoriasis, unspecified: Secondary | ICD-10-CM | POA: Diagnosis not present

## 2021-10-18 DIAGNOSIS — Z87891 Personal history of nicotine dependence: Secondary | ICD-10-CM | POA: Diagnosis not present

## 2021-10-18 DIAGNOSIS — S62109D Fracture of unspecified carpal bone, unspecified wrist, subsequent encounter for fracture with routine healing: Secondary | ICD-10-CM | POA: Diagnosis not present

## 2021-10-18 DIAGNOSIS — R7303 Prediabetes: Secondary | ICD-10-CM | POA: Diagnosis not present

## 2021-10-18 DIAGNOSIS — Z9181 History of falling: Secondary | ICD-10-CM | POA: Diagnosis not present

## 2021-10-18 DIAGNOSIS — K4090 Unilateral inguinal hernia, without obstruction or gangrene, not specified as recurrent: Secondary | ICD-10-CM | POA: Diagnosis not present

## 2021-10-18 DIAGNOSIS — E7800 Pure hypercholesterolemia, unspecified: Secondary | ICD-10-CM | POA: Diagnosis not present

## 2021-10-18 DIAGNOSIS — Z952 Presence of prosthetic heart valve: Secondary | ICD-10-CM | POA: Diagnosis not present

## 2021-10-18 DIAGNOSIS — R1319 Other dysphagia: Secondary | ICD-10-CM | POA: Diagnosis not present

## 2021-10-18 DIAGNOSIS — D696 Thrombocytopenia, unspecified: Secondary | ICD-10-CM | POA: Diagnosis not present

## 2021-10-18 DIAGNOSIS — E890 Postprocedural hypothyroidism: Secondary | ICD-10-CM | POA: Diagnosis not present

## 2021-10-18 DIAGNOSIS — I700 Atherosclerosis of aorta: Secondary | ICD-10-CM | POA: Diagnosis not present

## 2021-10-18 DIAGNOSIS — I10 Essential (primary) hypertension: Secondary | ICD-10-CM | POA: Diagnosis not present

## 2021-10-20 DIAGNOSIS — E611 Iron deficiency: Secondary | ICD-10-CM | POA: Diagnosis not present

## 2021-10-20 DIAGNOSIS — K4090 Unilateral inguinal hernia, without obstruction or gangrene, not specified as recurrent: Secondary | ICD-10-CM | POA: Diagnosis not present

## 2021-10-20 DIAGNOSIS — E890 Postprocedural hypothyroidism: Secondary | ICD-10-CM | POA: Diagnosis not present

## 2021-10-20 DIAGNOSIS — M0609 Rheumatoid arthritis without rheumatoid factor, multiple sites: Secondary | ICD-10-CM | POA: Diagnosis not present

## 2021-10-20 DIAGNOSIS — E7800 Pure hypercholesterolemia, unspecified: Secondary | ICD-10-CM | POA: Diagnosis not present

## 2021-10-20 DIAGNOSIS — Z87891 Personal history of nicotine dependence: Secondary | ICD-10-CM | POA: Diagnosis not present

## 2021-10-20 DIAGNOSIS — Z9181 History of falling: Secondary | ICD-10-CM | POA: Diagnosis not present

## 2021-10-20 DIAGNOSIS — R7303 Prediabetes: Secondary | ICD-10-CM | POA: Diagnosis not present

## 2021-10-20 DIAGNOSIS — I10 Essential (primary) hypertension: Secondary | ICD-10-CM | POA: Diagnosis not present

## 2021-10-20 DIAGNOSIS — Z952 Presence of prosthetic heart valve: Secondary | ICD-10-CM | POA: Diagnosis not present

## 2021-10-20 DIAGNOSIS — I700 Atherosclerosis of aorta: Secondary | ICD-10-CM | POA: Diagnosis not present

## 2021-10-20 DIAGNOSIS — Z86718 Personal history of other venous thrombosis and embolism: Secondary | ICD-10-CM | POA: Diagnosis not present

## 2021-10-20 DIAGNOSIS — K7460 Unspecified cirrhosis of liver: Secondary | ICD-10-CM | POA: Diagnosis not present

## 2021-10-20 DIAGNOSIS — L4050 Arthropathic psoriasis, unspecified: Secondary | ICD-10-CM | POA: Diagnosis not present

## 2021-10-20 DIAGNOSIS — R1319 Other dysphagia: Secondary | ICD-10-CM | POA: Diagnosis not present

## 2021-10-20 DIAGNOSIS — K219 Gastro-esophageal reflux disease without esophagitis: Secondary | ICD-10-CM | POA: Diagnosis not present

## 2021-10-20 DIAGNOSIS — D696 Thrombocytopenia, unspecified: Secondary | ICD-10-CM | POA: Diagnosis not present

## 2021-10-20 DIAGNOSIS — S62109D Fracture of unspecified carpal bone, unspecified wrist, subsequent encounter for fracture with routine healing: Secondary | ICD-10-CM | POA: Diagnosis not present

## 2021-10-25 DIAGNOSIS — I10 Essential (primary) hypertension: Secondary | ICD-10-CM | POA: Diagnosis not present

## 2021-10-25 DIAGNOSIS — D696 Thrombocytopenia, unspecified: Secondary | ICD-10-CM | POA: Diagnosis not present

## 2021-10-25 DIAGNOSIS — S62109D Fracture of unspecified carpal bone, unspecified wrist, subsequent encounter for fracture with routine healing: Secondary | ICD-10-CM | POA: Diagnosis not present

## 2021-10-25 DIAGNOSIS — Z86718 Personal history of other venous thrombosis and embolism: Secondary | ICD-10-CM | POA: Diagnosis not present

## 2021-10-25 DIAGNOSIS — M0609 Rheumatoid arthritis without rheumatoid factor, multiple sites: Secondary | ICD-10-CM | POA: Diagnosis not present

## 2021-10-25 DIAGNOSIS — I700 Atherosclerosis of aorta: Secondary | ICD-10-CM | POA: Diagnosis not present

## 2021-10-25 DIAGNOSIS — E611 Iron deficiency: Secondary | ICD-10-CM | POA: Diagnosis not present

## 2021-10-25 DIAGNOSIS — K7460 Unspecified cirrhosis of liver: Secondary | ICD-10-CM | POA: Diagnosis not present

## 2021-10-25 DIAGNOSIS — Z9181 History of falling: Secondary | ICD-10-CM | POA: Diagnosis not present

## 2021-10-25 DIAGNOSIS — K219 Gastro-esophageal reflux disease without esophagitis: Secondary | ICD-10-CM | POA: Diagnosis not present

## 2021-10-25 DIAGNOSIS — L4050 Arthropathic psoriasis, unspecified: Secondary | ICD-10-CM | POA: Diagnosis not present

## 2021-10-25 DIAGNOSIS — R7303 Prediabetes: Secondary | ICD-10-CM | POA: Diagnosis not present

## 2021-10-25 DIAGNOSIS — E7800 Pure hypercholesterolemia, unspecified: Secondary | ICD-10-CM | POA: Diagnosis not present

## 2021-10-25 DIAGNOSIS — E890 Postprocedural hypothyroidism: Secondary | ICD-10-CM | POA: Diagnosis not present

## 2021-10-25 DIAGNOSIS — R1319 Other dysphagia: Secondary | ICD-10-CM | POA: Diagnosis not present

## 2021-10-25 DIAGNOSIS — K4090 Unilateral inguinal hernia, without obstruction or gangrene, not specified as recurrent: Secondary | ICD-10-CM | POA: Diagnosis not present

## 2021-10-25 DIAGNOSIS — Z87891 Personal history of nicotine dependence: Secondary | ICD-10-CM | POA: Diagnosis not present

## 2021-10-25 DIAGNOSIS — Z952 Presence of prosthetic heart valve: Secondary | ICD-10-CM | POA: Diagnosis not present

## 2021-10-27 DIAGNOSIS — S52124D Nondisplaced fracture of head of right radius, subsequent encounter for closed fracture with routine healing: Secondary | ICD-10-CM | POA: Diagnosis not present

## 2021-10-27 DIAGNOSIS — S52514A Nondisplaced fracture of right radial styloid process, initial encounter for closed fracture: Secondary | ICD-10-CM | POA: Diagnosis not present

## 2021-11-09 DIAGNOSIS — E7800 Pure hypercholesterolemia, unspecified: Secondary | ICD-10-CM | POA: Diagnosis not present

## 2021-11-09 DIAGNOSIS — I10 Essential (primary) hypertension: Secondary | ICD-10-CM | POA: Diagnosis not present

## 2021-11-09 DIAGNOSIS — L4050 Arthropathic psoriasis, unspecified: Secondary | ICD-10-CM | POA: Diagnosis not present

## 2021-11-09 DIAGNOSIS — K219 Gastro-esophageal reflux disease without esophagitis: Secondary | ICD-10-CM | POA: Diagnosis not present

## 2021-11-15 ENCOUNTER — Telehealth: Payer: Self-pay | Admitting: *Deleted

## 2021-11-15 NOTE — Telephone Encounter (Signed)
[facility] faxed received stating Patient has agreed on delivery date of 11/16/21 for skyrizi.

## 2021-11-15 NOTE — Telephone Encounter (Signed)
Already notified daughter per mychart encounter.

## 2021-11-15 NOTE — Telephone Encounter (Signed)
-----   Message from Lavonna Monarch, MD sent at 11/13/2021  8:46 AM EDT ----- Although this resolved in a gentleman who grew up In [place] is not alarming, he should discuss this with his primary care physician before continuing his biologic.

## 2021-11-23 DIAGNOSIS — Z79899 Other long term (current) drug therapy: Secondary | ICD-10-CM | POA: Diagnosis not present

## 2021-11-23 DIAGNOSIS — M25551 Pain in right hip: Secondary | ICD-10-CM | POA: Diagnosis not present

## 2021-11-23 DIAGNOSIS — M810 Age-related osteoporosis without current pathological fracture: Secondary | ICD-10-CM | POA: Diagnosis not present

## 2021-11-23 DIAGNOSIS — K7460 Unspecified cirrhosis of liver: Secondary | ICD-10-CM | POA: Diagnosis not present

## 2021-11-23 DIAGNOSIS — M1990 Unspecified osteoarthritis, unspecified site: Secondary | ICD-10-CM | POA: Diagnosis not present

## 2021-11-23 DIAGNOSIS — M25552 Pain in left hip: Secondary | ICD-10-CM | POA: Diagnosis not present

## 2021-12-03 DIAGNOSIS — G309 Alzheimer's disease, unspecified: Secondary | ICD-10-CM | POA: Diagnosis not present

## 2021-12-03 DIAGNOSIS — I10 Essential (primary) hypertension: Secondary | ICD-10-CM | POA: Diagnosis not present

## 2021-12-19 ENCOUNTER — Encounter (HOSPITAL_COMMUNITY): Payer: Self-pay | Admitting: Emergency Medicine

## 2021-12-19 ENCOUNTER — Emergency Department (HOSPITAL_COMMUNITY): Payer: Medicare Other

## 2021-12-19 ENCOUNTER — Inpatient Hospital Stay (HOSPITAL_COMMUNITY)
Admission: EM | Admit: 2021-12-19 | Discharge: 2021-12-26 | DRG: 314 | Disposition: A | Discharge status: Home / Self Care | Payer: Medicare Other | Attending: Internal Medicine | Admitting: Internal Medicine

## 2021-12-19 ENCOUNTER — Other Ambulatory Visit: Payer: Self-pay

## 2021-12-19 DIAGNOSIS — N400 Enlarged prostate without lower urinary tract symptoms: Secondary | ICD-10-CM | POA: Diagnosis present

## 2021-12-19 DIAGNOSIS — T82857A Stenosis of cardiac prosthetic devices, implants and grafts, initial encounter: Secondary | ICD-10-CM | POA: Diagnosis not present

## 2021-12-19 DIAGNOSIS — K5900 Constipation, unspecified: Secondary | ICD-10-CM | POA: Diagnosis not present

## 2021-12-19 DIAGNOSIS — K7460 Unspecified cirrhosis of liver: Secondary | ICD-10-CM | POA: Diagnosis present

## 2021-12-19 DIAGNOSIS — Z743 Need for continuous supervision: Secondary | ICD-10-CM | POA: Diagnosis not present

## 2021-12-19 DIAGNOSIS — Z888 Allergy status to other drugs, medicaments and biological substances status: Secondary | ICD-10-CM

## 2021-12-19 DIAGNOSIS — Z7189 Other specified counseling: Secondary | ICD-10-CM | POA: Diagnosis not present

## 2021-12-19 DIAGNOSIS — E871 Hypo-osmolality and hyponatremia: Secondary | ICD-10-CM | POA: Diagnosis present

## 2021-12-19 DIAGNOSIS — I639 Cerebral infarction, unspecified: Secondary | ICD-10-CM | POA: Diagnosis not present

## 2021-12-19 DIAGNOSIS — K219 Gastro-esophageal reflux disease without esophagitis: Secondary | ICD-10-CM | POA: Diagnosis present

## 2021-12-19 DIAGNOSIS — M069 Rheumatoid arthritis, unspecified: Secondary | ICD-10-CM | POA: Diagnosis present

## 2021-12-19 DIAGNOSIS — E785 Hyperlipidemia, unspecified: Secondary | ICD-10-CM | POA: Diagnosis present

## 2021-12-19 DIAGNOSIS — R531 Weakness: Secondary | ICD-10-CM | POA: Diagnosis not present

## 2021-12-19 DIAGNOSIS — R29702 NIHSS score 2: Secondary | ICD-10-CM | POA: Diagnosis present

## 2021-12-19 DIAGNOSIS — N182 Chronic kidney disease, stage 2 (mild): Secondary | ICD-10-CM | POA: Diagnosis present

## 2021-12-19 DIAGNOSIS — Z79899 Other long term (current) drug therapy: Secondary | ICD-10-CM

## 2021-12-19 DIAGNOSIS — R001 Bradycardia, unspecified: Secondary | ICD-10-CM | POA: Diagnosis present

## 2021-12-19 DIAGNOSIS — T82897A Other specified complication of cardiac prosthetic devices, implants and grafts, initial encounter: Secondary | ICD-10-CM | POA: Diagnosis not present

## 2021-12-19 DIAGNOSIS — H9193 Unspecified hearing loss, bilateral: Secondary | ICD-10-CM | POA: Diagnosis present

## 2021-12-19 DIAGNOSIS — I2510 Atherosclerotic heart disease of native coronary artery without angina pectoris: Secondary | ICD-10-CM | POA: Diagnosis not present

## 2021-12-19 DIAGNOSIS — I6381 Other cerebral infarction due to occlusion or stenosis of small artery: Secondary | ICD-10-CM | POA: Diagnosis not present

## 2021-12-19 DIAGNOSIS — I129 Hypertensive chronic kidney disease with stage 1 through stage 4 chronic kidney disease, or unspecified chronic kidney disease: Secondary | ICD-10-CM | POA: Diagnosis not present

## 2021-12-19 DIAGNOSIS — Z602 Problems related to living alone: Secondary | ICD-10-CM | POA: Diagnosis present

## 2021-12-19 DIAGNOSIS — I6389 Other cerebral infarction: Secondary | ICD-10-CM | POA: Diagnosis present

## 2021-12-19 DIAGNOSIS — I350 Nonrheumatic aortic (valve) stenosis: Secondary | ICD-10-CM

## 2021-12-19 DIAGNOSIS — Z515 Encounter for palliative care: Secondary | ICD-10-CM | POA: Diagnosis not present

## 2021-12-19 DIAGNOSIS — Y831 Surgical operation with implant of artificial internal device as the cause of abnormal reaction of the patient, or of later complication, without mention of misadventure at the time of the procedure: Secondary | ICD-10-CM | POA: Diagnosis present

## 2021-12-19 DIAGNOSIS — F03A3 Unspecified dementia, mild, with mood disturbance: Secondary | ICD-10-CM | POA: Diagnosis not present

## 2021-12-19 DIAGNOSIS — D696 Thrombocytopenia, unspecified: Secondary | ICD-10-CM | POA: Diagnosis not present

## 2021-12-19 DIAGNOSIS — Z9049 Acquired absence of other specified parts of digestive tract: Secondary | ICD-10-CM

## 2021-12-19 DIAGNOSIS — R112 Nausea with vomiting, unspecified: Secondary | ICD-10-CM | POA: Diagnosis not present

## 2021-12-19 DIAGNOSIS — Z7982 Long term (current) use of aspirin: Secondary | ICD-10-CM

## 2021-12-19 DIAGNOSIS — Z20822 Contact with and (suspected) exposure to covid-19: Secondary | ICD-10-CM | POA: Diagnosis present

## 2021-12-19 DIAGNOSIS — F0390 Unspecified dementia without behavioral disturbance: Secondary | ICD-10-CM | POA: Diagnosis present

## 2021-12-19 DIAGNOSIS — R55 Syncope and collapse: Secondary | ICD-10-CM | POA: Diagnosis not present

## 2021-12-19 DIAGNOSIS — Z87891 Personal history of nicotine dependence: Secondary | ICD-10-CM

## 2021-12-19 DIAGNOSIS — F32A Depression, unspecified: Secondary | ICD-10-CM | POA: Diagnosis present

## 2021-12-19 DIAGNOSIS — I499 Cardiac arrhythmia, unspecified: Secondary | ICD-10-CM | POA: Diagnosis not present

## 2021-12-19 DIAGNOSIS — I10 Essential (primary) hypertension: Secondary | ICD-10-CM | POA: Diagnosis not present

## 2021-12-19 DIAGNOSIS — Z953 Presence of xenogenic heart valve: Secondary | ICD-10-CM

## 2021-12-19 DIAGNOSIS — I509 Heart failure, unspecified: Secondary | ICD-10-CM | POA: Diagnosis not present

## 2021-12-19 DIAGNOSIS — R6889 Other general symptoms and signs: Secondary | ICD-10-CM | POA: Diagnosis not present

## 2021-12-19 DIAGNOSIS — Z91012 Allergy to eggs: Secondary | ICD-10-CM

## 2021-12-19 DIAGNOSIS — Z952 Presence of prosthetic heart valve: Secondary | ICD-10-CM

## 2021-12-19 DIAGNOSIS — R404 Transient alteration of awareness: Secondary | ICD-10-CM | POA: Diagnosis not present

## 2021-12-19 DIAGNOSIS — Z961 Presence of intraocular lens: Secondary | ICD-10-CM | POA: Diagnosis present

## 2021-12-19 DIAGNOSIS — I110 Hypertensive heart disease with heart failure: Secondary | ICD-10-CM | POA: Diagnosis not present

## 2021-12-19 DIAGNOSIS — M0600 Rheumatoid arthritis without rheumatoid factor, unspecified site: Secondary | ICD-10-CM | POA: Diagnosis not present

## 2021-12-19 DIAGNOSIS — K7030 Alcoholic cirrhosis of liver without ascites: Secondary | ICD-10-CM | POA: Diagnosis present

## 2021-12-19 LAB — CBC
HCT: 43.8 % (ref 39.0–52.0)
Hemoglobin: 14.5 g/dL (ref 13.0–17.0)
MCH: 29.6 pg (ref 26.0–34.0)
MCHC: 33.1 g/dL (ref 30.0–36.0)
MCV: 89.4 fL (ref 80.0–100.0)
Platelets: 141 10*3/uL — ABNORMAL LOW (ref 150–400)
RBC: 4.9 MIL/uL (ref 4.22–5.81)
RDW: 12.2 % (ref 11.5–15.5)
WBC: 7.9 10*3/uL (ref 4.0–10.5)
nRBC: 0 % (ref 0.0–0.2)

## 2021-12-19 LAB — BASIC METABOLIC PANEL
Anion gap: 7 (ref 5–15)
BUN: 19 mg/dL (ref 8–23)
CO2: 25 mmol/L (ref 22–32)
Calcium: 8.6 mg/dL — ABNORMAL LOW (ref 8.9–10.3)
Chloride: 102 mmol/L (ref 98–111)
Creatinine, Ser: 1.05 mg/dL (ref 0.61–1.24)
GFR, Estimated: >60 mL/min (ref >60)
Glucose, Bld: 145 mg/dL — ABNORMAL HIGH (ref 70–99)
Potassium: 3.8 mmol/L (ref 3.5–5.1)
Sodium: 134 mmol/L — ABNORMAL LOW (ref 135–145)

## 2021-12-19 MED ORDER — ONDANSETRON HCL 4 MG/2ML IJ SOLN
4.0000 mg | Freq: Once | INTRAMUSCULAR | Status: AC
  Administered 2021-12-20: 4 mg via INTRAVENOUS
  Filled 2021-12-19: qty 2

## 2021-12-19 MED ORDER — SODIUM CHLORIDE 0.9 % IV BOLUS
500.0000 mL | Freq: Once | INTRAVENOUS | Status: AC
  Administered 2021-12-20: 500 mL via INTRAVENOUS

## 2021-12-19 NOTE — ED Provider Notes (Signed)
Emergency Department Provider Note   I have reviewed the triage vital signs and the nursing notes.   HISTORY  Chief Complaint Near Syncope and Emesis   HPI [name] is a 88 y.o. male with past history reviewed below presents to the emergency department by ambulance after near syncope event today and vomiting.  Patient has been complaining of dizziness and fatigue throughout the day according to family at bedside.  He began to have vomiting followed by a period of significantly decreased responsiveness.  Family does not think that he fully lost consciousness but was very close and had to be held up by EMS.  He denies any headache or chest discomfort.  He was apparently having some mild abdominal discomfort with vomiting but not currently.  No fevers.  Family not aware of any new medications.  Past Medical History:  Diagnosis Date   BPH (benign prostatic hypertrophy)    Depression    Diverticulosis of colon    GERD (gastroesophageal reflux disease)    Gilbert's syndrome    History of kidney stones    HLD (hyperlipidemia)    HOH (hard of hearing)    refuses to wear his Hearing Aid   Hypertension    Inguinal hernia    right   Internal hemorrhoid    Normal coronary arteries    a. Cath 07/2010: normal coronaries. Felt to have noncardiac CP/SOB at that time possibly r/t anxiety.   RA (rheumatoid arthritis) (HCC)    bilateral hands/ wrist--  seronegative   S/P TAVR (transcatheter aortic valve replacement) 03/27/2018   23 mm Edwards Sapien 3 transcatheter heart valve placed via percutaneous right transfemoral approach    Severe aortic stenosis    Thyroid nodule    noted 11-2009   Wears dentures     Review of Systems  Constitutional: No fever/chills. Positive fatigue and near syncope.  Eyes: No visual changes. ENT: No sore throat. Cardiovascular: Denies chest pain. Respiratory: Denies shortness of breath. Gastrointestinal: No abdominal pain. Positive vomiting.  No  diarrhea.  No constipation. Genitourinary: Negative for dysuria. Musculoskeletal: Negative for back pain. Skin: Negative for rash. Neurological: Negative for headaches, focal weakness or numbness.  ____________________________________________   PHYSICAL EXAM:  VITAL SIGNS: ED Triage Vitals  Enc Vitals Group     BP 12/19/21 2318 (!) 147/63     Pulse Rate 12/19/21 2318 (!) 54     Resp 12/19/21 2318 16     Temp 12/19/21 2318 (!) 97.3 F (36.3 C)     Temp Source 12/19/21 2318 Oral     SpO2 12/19/21 2318 95 %     Weight 12/19/21 2321 165 lb 5.5 oz (75 kg)     Height 12/19/21 2321 '5\' 8"'$  (1.727 m)   Constitutional: Somnolent but no distress.  Eyes: Conjunctivae are normal. Head: Atraumatic. Nose: No congestion/rhinnorhea. Mouth/Throat: Mucous membranes are dry.  Neck: No stridor.   Cardiovascular: Bradycardia. Good peripheral circulation. Grossly normal heart sounds.   Respiratory: Normal respiratory effort.  No retractions. Lungs CTAB. Gastrointestinal: Soft and nontender. No distention.  Musculoskeletal: No lower extremity tenderness nor edema. No gross deformities of extremities. Neurologic:  Normal speech and language. No gross focal neurologic deficits are appreciated.  Skin:  Skin is warm, dry and intact. No rash noted.   ____________________________________________   LABS (all labs ordered are listed, but only abnormal results are displayed)  Labs Reviewed  BASIC METABOLIC PANEL - Abnormal; Notable for the following components:      Result  Value   Sodium 134 (*)    Glucose, Bld 145 (*)    Calcium 8.6 (*)    All other components within normal limits  CBC - Abnormal; Notable for the following components:   Platelets 141 (*)    All other components within normal limits  HEPATIC FUNCTION PANEL - Abnormal; Notable for the following components:   Total Bilirubin 1.8 (*)    Indirect Bilirubin 1.6 (*)    All other components within normal limits  CBG MONITORING, ED -  Abnormal; Notable for the following components:   Glucose-Capillary 155 (*)    All other components within normal limits  RESP PANEL BY RT-PCR (FLU A&B, COVID) ARPGX2  URINALYSIS, ROUTINE W REFLEX MICROSCOPIC  LIPASE, BLOOD  TROPONIN I (HIGH SENSITIVITY)  TROPONIN I (HIGH SENSITIVITY)   ____________________________________________  EKG   EKG Interpretation  Date/Time:  Sunday December 19 2021 23:19:39 EDT Ventricular Rate:  53 PR Interval:  191 QRS Duration: 93 QT Interval:  447 QTC Calculation: 420 R Axis:   -21 Text Interpretation: Sinus rhythm Borderline left axis deviation Minimal ST elevation, anterior leads Confirmed by Nanda Quinton [phone]) on 12/19/2021 11:59:54 PM        ____________________________________________  RADIOLOGY  CT Head Wo Contrast  Result Date: 12/20/2021 CLINICAL DATA:  Weakness near syncopal episode EXAM: CT HEAD WITHOUT CONTRAST TECHNIQUE: Contiguous axial images were obtained from the base of the skull through the vertex without intravenous contrast. RADIATION DOSE REDUCTION: This exam was performed according to the departmental dose-optimization program which includes automated exposure control, adjustment of the mA and/or kV according to patient size and/or use of iterative reconstruction technique. COMPARISON:  CT brain 10/04/2019 FINDINGS: Brain: No acute territorial infarction, hemorrhage or intracranial mass. Atrophy and chronic small vessel ischemic changes of the white matter. Chronic appearing lacunar infarcts within the bilateral basal ganglia. Stable ventricle size. Vascular: No hyperdense vessels.  Carotid vascular calcification Skull: Normal. Negative for fracture or focal lesion. Sinuses/Orbits: No acute finding. Other: None IMPRESSION: 1. No CT evidence for acute intracranial abnormality. 2. Atrophy and chronic small vessel ischemic changes of the white matter Electronically Signed   By: Donavan Foil M.D.   On: 12/20/2021 00:47   DG Chest 2  View  Result Date: 12/20/2021 CLINICAL DATA:  Weakness EXAM: CHEST - 2 VIEW COMPARISON:  09/14/2021 FINDINGS: Cardiac shadow is within normal limits. Lungs are well aerated bilaterally. No focal infiltrate or effusion is seen. Degenerative changes of the thoracic spine are noted. Changes of prior TAVR are seen. IMPRESSION: No active cardiopulmonary disease. Electronically Signed   By: Inez Catalina M.D.   On: 12/20/2021 00:43    ____________________________________________   PROCEDURES  Procedure(s) performed:   Procedures  None  ____________________________________________   INITIAL IMPRESSION / ASSESSMENT AND PLAN / ED COURSE  Pertinent labs & imaging results that were available during my care of the patient were reviewed by me and considered in my medical decision making (see chart for details).   This patient is Presenting for Evaluation of AMS, which does require a range of treatment options, and is a complaint that involves a high risk of morbidity and mortality.  The Differential Diagnoses includes but is not exclusive to alcohol, illicit or prescription medications, intracranial pathology such as stroke, intracerebral hemorrhage, fever or infectious causes including sepsis, hypoxemia, uremia, trauma, endocrine related disorders such as diabetes, hypoglycemia, thyroid-related diseases, etc.\  Critical Interventions-    Medications  aspirin chewable tablet 81 mg (has no administration  in time range)  losartan (COZAAR) tablet 25 mg (has no administration in time range)  escitalopram (LEXAPRO) tablet 20 mg (has no administration in time range)  pantoprazole (PROTONIX) EC tablet 40 mg (has no administration in time range)  dutasteride (AVODART) capsule 0.5 mg (has no administration in time range)  melatonin tablet 10-15 mg (has no administration in time range)  sodium chloride flush (NS) 0.9 % injection 3 mL (has no administration in time range)  enoxaparin (LOVENOX) injection 40  mg (has no administration in time range)  acetaminophen (TYLENOL) tablet 650 mg (has no administration in time range)    Or  acetaminophen (TYLENOL) suppository 650 mg (has no administration in time range)  senna-docusate (Senokot-S) tablet 1 tablet (has no administration in time range)  ondansetron (ZOFRAN) tablet 4 mg (has no administration in time range)    Or  ondansetron (ZOFRAN) injection 4 mg (has no administration in time range)  0.9 %  sodium chloride infusion (has no administration in time range)  sodium chloride 0.9 % bolus 500 mL (0 mLs Intravenous Stopped 12/20/21 0114)  ondansetron (ZOFRAN) injection 4 mg (4 mg Intravenous Given 12/20/21 0009)    Reassessment after intervention: Symptoms slightly improved.    I did obtain Additional Historical Information from family at bedside.  I decided to review pertinent External Data, and in summary no recent ED visits for similar.   Clinical Laboratory Tests Ordered, included CBG 155.  No UTI.  No leukocytosis.  No acute kidney injury.  COVID and flu are negative.  Radiologic Tests Ordered, included CT head and CXR. I independently interpreted the images and agree with radiology interpretation.   Cardiac Monitor Tracing which shows NSR.   Social Determinants of Health Risk patient is a non-smoker.   Consult complete with Hospitalist, Dr. Myna Hidalgo, plan for admit.   Medical Decision Making: Summary:  Patient presents to the emergency department with dizziness/near syncope and vomiting.  No focal deficit to strongly suspect stroke although this does remain in the differential with some language barrier but exam is overall reassuring.  Seems more consistent with near syncope episode.  With vomiting vasovagal syncope is a consideration.  No ectopy on cardiac monitoring.  Plan for IV fluids and admit for observation.    Reevaluation with update and discussion with patient and family at bedside. They are in agreement with plan.    Disposition: admit  ____________________________________________  FINAL CLINICAL IMPRESSION(S) / ED DIAGNOSES  Final diagnoses:  Near syncope  Nausea and vomiting, unspecified vomiting type    Note:  This document was prepared using Dragon voice recognition software and may include unintentional dictation errors.  Nanda Quinton, MD, [facility] Emergency Medicine    Lorien Shingler, [facility], MD 12/20/21 [phone]

## 2021-12-19 NOTE — ED Triage Notes (Signed)
  Patient BIB EMS for weakness and near syncopal episode.  Family states patient has had 2 day hx of nausea and vomiting.  Patient has hx of dementia and only speaks russian.  Family on the way.  Patient unable to state pain but follows some commands.

## 2021-12-20 ENCOUNTER — Emergency Department (HOSPITAL_COMMUNITY): Payer: Medicare Other

## 2021-12-20 ENCOUNTER — Encounter (HOSPITAL_COMMUNITY): Payer: Self-pay | Admitting: Family Medicine

## 2021-12-20 ENCOUNTER — Observation Stay (HOSPITAL_COMMUNITY): Payer: Medicare Other

## 2021-12-20 DIAGNOSIS — N182 Chronic kidney disease, stage 2 (mild): Secondary | ICD-10-CM | POA: Diagnosis not present

## 2021-12-20 DIAGNOSIS — R55 Syncope and collapse: Secondary | ICD-10-CM | POA: Diagnosis not present

## 2021-12-20 DIAGNOSIS — F0390 Unspecified dementia without behavioral disturbance: Secondary | ICD-10-CM | POA: Diagnosis not present

## 2021-12-20 DIAGNOSIS — M0600 Rheumatoid arthritis without rheumatoid factor, unspecified site: Secondary | ICD-10-CM

## 2021-12-20 DIAGNOSIS — R001 Bradycardia, unspecified: Secondary | ICD-10-CM

## 2021-12-20 DIAGNOSIS — K7460 Unspecified cirrhosis of liver: Secondary | ICD-10-CM | POA: Diagnosis present

## 2021-12-20 DIAGNOSIS — I10 Essential (primary) hypertension: Secondary | ICD-10-CM | POA: Diagnosis not present

## 2021-12-20 DIAGNOSIS — Z952 Presence of prosthetic heart valve: Secondary | ICD-10-CM

## 2021-12-20 DIAGNOSIS — F32A Depression, unspecified: Secondary | ICD-10-CM

## 2021-12-20 LAB — HEPATIC FUNCTION PANEL
ALT: 36 U/L (ref 0–44)
AST: 29 U/L (ref 15–41)
Albumin: 4 g/dL (ref 3.5–5.0)
Alkaline Phosphatase: 55 U/L (ref 38–126)
Bilirubin, Direct: 0.2 mg/dL (ref 0.0–0.2)
Indirect Bilirubin: 1.6 mg/dL — ABNORMAL HIGH (ref 0.3–0.9)
Total Bilirubin: 1.8 mg/dL — ABNORMAL HIGH (ref 0.3–1.2)
Total Protein: 7 g/dL (ref 6.5–8.1)

## 2021-12-20 LAB — URINALYSIS, ROUTINE W REFLEX MICROSCOPIC
Bilirubin Urine: NEGATIVE
Glucose, UA: NEGATIVE mg/dL
Hgb urine dipstick: NEGATIVE
Ketones, ur: NEGATIVE mg/dL
Leukocytes,Ua: NEGATIVE
Nitrite: NEGATIVE
Protein, ur: NEGATIVE mg/dL
Specific Gravity, Urine: 1.019 (ref 1.005–1.030)
pH: 5 (ref 5.0–8.0)

## 2021-12-20 LAB — ECHOCARDIOGRAM COMPLETE
AR max vel: 1.65 cm2
AV Area VTI: 1.66 cm2
AV Area mean vel: 1.61 cm2
AV Mean grad: 39.1 mmHg
AV Peak grad: 72 mmHg
Ao pk vel: 4.24 m/s
Area-P 1/2: 2.16 cm2
Height: 68 in
MV VTI: 3.01 cm2
P 1/2 time: 769 msec
S' Lateral: 2.6 cm
Single Plane A4C EF: 74.6 %
Weight: 2645.52 oz

## 2021-12-20 LAB — LIPASE, BLOOD: Lipase: 38 U/L (ref 11–51)

## 2021-12-20 LAB — RESP PANEL BY RT-PCR (FLU A&B, COVID) ARPGX2
Influenza A by PCR: NEGATIVE
Influenza B by PCR: NEGATIVE
SARS Coronavirus 2 by RT PCR: NEGATIVE

## 2021-12-20 LAB — GLUCOSE, CAPILLARY: Glucose-Capillary: 128 mg/dL — ABNORMAL HIGH (ref 70–99)

## 2021-12-20 LAB — TROPONIN I (HIGH SENSITIVITY)
Troponin I (High Sensitivity): 8 ng/L (ref <18)
Troponin I (High Sensitivity): 9 ng/L (ref <18)

## 2021-12-20 LAB — CBG MONITORING, ED: Glucose-Capillary: 155 mg/dL — ABNORMAL HIGH (ref 70–99)

## 2021-12-20 LAB — TSH: TSH: 6.048 u[IU]/mL — ABNORMAL HIGH (ref 0.350–4.500)

## 2021-12-20 MED ORDER — PANTOPRAZOLE SODIUM 40 MG PO TBEC
40.0000 mg | DELAYED_RELEASE_TABLET | Freq: Every day | ORAL | Status: DC
  Administered 2021-12-20 – 2021-12-26 (×7): 40 mg via ORAL
  Filled 2021-12-20 (×7): qty 1

## 2021-12-20 MED ORDER — ONDANSETRON HCL 4 MG/2ML IJ SOLN
4.0000 mg | Freq: Four times a day (QID) | INTRAMUSCULAR | Status: DC | PRN
  Administered 2021-12-24: 4 mg via INTRAVENOUS
  Filled 2021-12-20: qty 2

## 2021-12-20 MED ORDER — ENOXAPARIN SODIUM 40 MG/0.4ML IJ SOSY
40.0000 mg | PREFILLED_SYRINGE | INTRAMUSCULAR | Status: DC
  Administered 2021-12-20 – 2021-12-26 (×7): 40 mg via SUBCUTANEOUS
  Filled 2021-12-20 (×7): qty 0.4

## 2021-12-20 MED ORDER — ESCITALOPRAM OXALATE 20 MG PO TABS
20.0000 mg | ORAL_TABLET | Freq: Every day | ORAL | Status: DC
Start: 2021-12-20 — End: 2021-12-26
  Administered 2021-12-20 – 2021-12-25 (×6): 20 mg via ORAL
  Filled 2021-12-20 (×6): qty 1

## 2021-12-20 MED ORDER — SODIUM CHLORIDE 0.9 % IV SOLN: INTRAVENOUS | Status: AC

## 2021-12-20 MED ORDER — ASPIRIN 81 MG PO CHEW
81.0000 mg | CHEWABLE_TABLET | Freq: Every day | ORAL | Status: DC
  Administered 2021-12-20 – 2021-12-26 (×7): 81 mg via ORAL
  Filled 2021-12-20 (×7): qty 1

## 2021-12-20 MED ORDER — ACETAMINOPHEN 650 MG RE SUPP: 650.0000 mg | Freq: Four times a day (QID) | RECTAL | Status: DC | PRN

## 2021-12-20 MED ORDER — SENNOSIDES-DOCUSATE SODIUM 8.6-50 MG PO TABS
1.0000 | ORAL_TABLET | Freq: Every evening | ORAL | Status: DC | PRN
  Administered 2021-12-22 – 2021-12-23 (×2): 1 via ORAL
  Filled 2021-12-20 (×2): qty 1

## 2021-12-20 MED ORDER — LOSARTAN POTASSIUM 25 MG PO TABS: 25.0000 mg | ORAL_TABLET | Freq: Every day | ORAL | Status: DC

## 2021-12-20 MED ORDER — SODIUM CHLORIDE 0.9% FLUSH
3.0000 mL | Freq: Two times a day (BID) | INTRAVENOUS | Status: DC
  Administered 2021-12-20 – 2021-12-26 (×12): 3 mL via INTRAVENOUS

## 2021-12-20 MED ORDER — ADULT MULTIVITAMIN W/MINERALS CH
1.0000 | ORAL_TABLET | Freq: Every day | ORAL | Status: DC
  Administered 2021-12-20 – 2021-12-26 (×7): 1 via ORAL
  Filled 2021-12-20 (×7): qty 1

## 2021-12-20 MED ORDER — MELATONIN 5 MG PO TABS
10.0000 mg | ORAL_TABLET | Freq: Every evening | ORAL | Status: DC | PRN
  Administered 2021-12-22 – 2021-12-23 (×2): 10 mg via ORAL
  Administered 2021-12-24: 15 mg via ORAL
  Filled 2021-12-20 (×2): qty 2
  Filled 2021-12-20: qty 3

## 2021-12-20 MED ORDER — ONDANSETRON HCL 4 MG PO TABS: 4.0000 mg | ORAL_TABLET | Freq: Four times a day (QID) | ORAL | Status: DC | PRN

## 2021-12-20 MED ORDER — DUTASTERIDE 0.5 MG PO CAPS
0.5000 mg | ORAL_CAPSULE | Freq: Every day | ORAL | Status: DC
  Administered 2021-12-20 – 2021-12-26 (×7): 0.5 mg via ORAL
  Filled 2021-12-20 (×8): qty 1

## 2021-12-20 MED ORDER — ACETAMINOPHEN 325 MG PO TABS: 650.0000 mg | ORAL_TABLET | Freq: Four times a day (QID) | ORAL | Status: DC | PRN

## 2021-12-20 NOTE — Evaluation (Addendum)
Occupational Therapy Evaluation Patient Details Name: [name] MRN: [id number] DOB: Oct 19, 1927 Today's Date: 12/20/2021   History of Present Illness Patient is a 88 year old male who presented to the hosptial after near syncopal episode.  PMH: chronic diastolic heart failure, HTN, s/p TAVR, diverticulosis, GERD, gilbert's syndrome, HOH, rheumatoid arthritis, CKD   Clinical Impression   Patient reported living at home alone with family support prior level. Patient was noted to have some confusion during session with limited assessment ability through translator with patient having difficulty hearing translator at times.  Patient when asked if he had support for 24/7 at home if needed at d/c he reported " no I'm good" via interpreter. Family was not in room at this time.  Patient was max A for LB Dressing tasks sitting EOB on stretcher with posterior leaning noted when no UE support . Patient's evaluation was limited with height of patient and stretcher in ED on this date for safety. Patient was noted to have decreased functional activity tolerance,decreased sitting balance, decreased endurance, decreased standing balance, decreased safety awareness, and decreased knowledge of AD/AE impacting participation in ADLs. Patient would continue to benefit from skilled OT services at this time while admitted and after d/c to address noted deficits in order to improve overall safety and independence in ADLs.    Blood pressures:  Supine: 143/1mg HR 53bpm Sitting EOB 164/74 mmhg HR 63-75 bpm   Recommendations for follow up therapy are one component of a multi-disciplinary discharge planning process, led by the attending physician.  Recommendations may be updated based on patient status, additional functional criteria and insurance authorization.   Follow Up Recommendations  Home health OT    Assistance Recommended at Discharge    Patient can return home with the following A lot of help with  bathing/dressing/bathroom;Direct supervision/assist for financial management;Direct supervision/assist for medications management;Assistance with cooking/housework;Help with stairs or ramp for entrance;Assist for transportation    Functional Status Assessment  Patient has had a recent decline in their functional status and demonstrates the ability to make significant improvements in function in a reasonable and predictable amount of time.  Equipment Recommendations  Other (comment) (TBD)    Recommendations for Other Services       Precautions / Restrictions Precautions Precautions: Fall Precaution Comments: monitor vitals Restrictions Weight Bearing Restrictions: No      Mobility Bed Mobility Overal bed mobility: Needs Assistance Bed Mobility: Supine to Sit, Sit to Supine     Supine to sit: Min assist Sit to supine: Min assist   General bed mobility comments: with increased time. patient was unable to participate in scoot transfers up side of bed on this date.    Transfers                          Balance Overall balance assessment: Needs assistance Sitting-balance support: No upper extremity supported Sitting balance-Leahy Scale: Poor Sitting balance - Comments: when attempting to engage in ADLs Postural control: Posterior lean                                 ADL either performed or assessed with clinical judgement   ADL Overall ADL's : Needs assistance/impaired Eating/Feeding: Set up;Bed level   Grooming: Sitting;Minimal assistance Grooming Details (indicate cue type and reason): patient was noted to have posterior leaning when sitting without BUE support. Upper Body Bathing: Minimal assistance;Bed level  Lower Body Bathing: Maximal assistance;Bed level   Upper Body Dressing : Minimal assistance;Bed level   Lower Body Dressing: Maximal assistance;Bed level Lower Body Dressing Details (indicate cue type and reason): patient attempted  to don socks sitting on EOB with posterior leaning noted with patient unable to maintain foot in figure 4 position and donn it.   Toilet Transfer Details (indicate cue type and reason): unable to transfer from stretcher due to sitting balacne deficits and height of patient v.s. stretcher                 Vision   Vision Assessment?: No apparent visual deficits     Perception     Praxis      Pertinent Vitals/Pain Pain Assessment Pain Assessment: No/denies pain     Hand Dominance     Extremity/Trunk Assessment Upper Extremity Assessment Upper Extremity Assessment: Overall WFL for tasks assessed   Lower Extremity Assessment Lower Extremity Assessment:  (noted to have some skin peeling on bilateral feet with some redness noted. patient reported that is normal and no pain via interpreter)   Cervical / Trunk Assessment Cervical / Trunk Assessment: Normal   Communication Communication Communication: Prefers language other than English (russian)   Cognition Arousal/Alertness: Awake/alert Behavior During Therapy: WFL for tasks assessed/performed Overall Cognitive Status: Difficult to assess                                 General Comments: does not remember what day it is today     General Comments       Exercises     Shoulder Instructions      Home Living Family/patient expects to be discharged to:: Private residence Living Arrangements: Alone Available Help at Discharge: Family;Available PRN/intermittently   Home Access: Stairs to enter Entrance Stairs-Number of Steps: 3-4 Entrance Stairs-Rails: Left Home Layout: One level     Bathroom Shower/Tub: Tub/shower unit             Additional Comments: patient reported girlfriend comes to visit      Prior Functioning/Environment Prior Level of Function : Independent/Modified Independent             Mobility Comments: no AD          OT Problem List: Decreased activity  tolerance;Impaired balance (sitting and/or standing);Impaired vision/perception;Decreased knowledge of precautions;Decreased safety awareness      OT Treatment/Interventions: Self-care/ADL training;Therapeutic exercise;Neuromuscular education;Energy conservation;DME and/or AE instruction;Patient/family education;Balance training    OT Goals(Current goals can be found in the care plan section) Acute Rehab OT Goals Patient Stated Goal: none stated OT Goal Formulation: Patient unable to participate in goal setting Time For Goal Achievement: 01/03/22 Potential to Achieve Goals: Fair  OT Frequency: Min 2X/week    Co-evaluation              AM-PAC OT "6 Clicks" Daily Activity     Outcome Measure Help from another person eating meals?: A Little Help from another person taking care of personal grooming?: A Little Help from another person toileting, which includes using toliet, bedpan, or urinal?: A Lot Help from another person bathing (including washing, rinsing, drying)?: A Lot Help from another person to put on and taking off regular upper body clothing?: A Little Help from another person to put on and taking off regular lower body clothing?: A Lot 6 Click Score: 15   End of Session Nurse Communication: Other (comment) (ok to  participate in session)  Activity Tolerance: Patient tolerated treatment well Patient left: in bed;with call bell/phone within reach  OT Visit Diagnosis: Muscle weakness (generalized) (M62.81)                Time: 0375-4360 OT Time Calculation (min): 24 min Charges:  OT General Charges $OT Visit: 1 Visit OT Evaluation $OT Eval Moderate Complexity: 1 Mod OT Treatments $Self Care/Home Management : 8-22 mins  Jackelyn Poling OTR/L, MS Acute Rehabilitation Department Office# [phone] Pager# [phone]   Marcellina Millin 12/20/2021, 11:04 AM

## 2021-12-20 NOTE — Progress Notes (Signed)
PROGRESS NOTE  [name] JKK:[id number] DOB: June 14, 1927   PCP: Lajean Manes, MD  Patient is from: Home.  Lives alone.  Family lives close by.  DOA: 12/19/2021 LOS: 0  Chief complaints Chief Complaint  Patient presents with   Near Syncope   Emesis     Brief Narrative / Interim history: 88 year old M with PMH of dementia, nonobstructive CAD, severe AS s/p TAVR, liver cirrhosis, RA, HTN, alcohol abuse, sinus bradycardia and BPH presenting with lightheadedness, nausea, vomiting, diaphoresis and generalized weakness for 1 to 2 days and admitted for near syncope.   In ED, sinus bradycardia to 50s.  BP slightly elevated. Na 134.  Total bili 1.8.  Platelet 141.  Otherwise, CMP and CBC without significant finding.  Lipase normal.  Troponin 8.0.  EKG features sinus bradycardia at 53.  COVID-19 and influenza PCR nonreactive.  CT head and chest x-ray without acute finding.  Patient was started on IV fluid and started for near syncope.  TTE ordered.  Subjective: Seen and examined earlier this morning.  No major events overnight or this morning.  Patient has no complaints but not a great historian.  He has no insight into why he is in the hospital.  Revisited patient in the afternoon and talk to patient's daughter at bedside.  She is concerned about his "liver scar and alcohol use".  She states he hides alcohol bottles in the house.   Objective: Vitals:   12/20/21 1449 12/20/21 1452 12/20/21 1457 12/20/21 1500  BP: (!) 152/57 (!) 165/69 (!) 147/61 (!) 159/68  Pulse: (!) 53 67 64 63  Resp:      Temp:      TempSrc:      SpO2: 95% 96% 97% 97%  Weight:      Height:        Examination:  GENERAL: No apparent distress.  Nontoxic. HEENT: MMM.  Vision and hearing grossly intact.  NECK: Supple.  No apparent JVD.  RESP:  No IWOB.  Fair aeration bilaterally. CVS:  RRR.  2/6 SEM  ABD/GI/GU: BS+. Abd soft, NTND.  MSK/EXT:  Moves extremities. No apparent deformity. No edema.  SKIN: no apparent  skin lesion or wound NEURO: Awake, alert and oriented x4 except date and year.  No apparent focal neuro deficit. PSYCH: Calm. Normal affect.   Procedures:  None  Microbiology summarized: [id number] and influenza PCR nonreactive.  Assessment and plan: Principal Problem:   Near syncope Active Problems:   Sinus bradycardia   Benign essential HTN   S/P TAVR (transcatheter aortic valve replacement)   RA (rheumatoid arthritis) (HCC)   Benign prostatic hyperplasia   Depression   CKD (chronic kidney disease), stage II   Dementia (HCC)   Unspecified cirrhosis of liver (HCC)  Near syncope likely due to TAVR stenosis-TTE with marked increase in gradient through AV compared to prior echocardiogram.  He also have sinus bradycardia but with chronotropic response to exertion.  He is not on nodal blocking agents or Aricept.  Orthostatic vitals negative but symptomatic from lying to sitting.  Slightly elevated TSH as well.  Has history of dementia and liver cirrhosis which could contribute -TEE and cardiology evaluation recommended-consult requested. -TED hose -Check carotid US and free T4 -Fall precaution -PT/OT  History of severe AS s/p TAVR: TTE with marked increase in gradient through AV compared to prior. -Seen near syncope as above  Sinus bradycardia: Bradycardic to 50s but chronotropic response to exertion.  Not on nodal blocking agents or Aricept. -Continue telemetry monitoring  Nausea and vomiting: Has tolerated breakfast this morning.  Abdominal exam benign.  LFT normal except for mild elevated bilirubin.  Had a cholecystectomy in 2020.  Lipase normal. -Antiemetics as needed  Hyperbilirubinemia with indirect predominance: Has history of Gilbert's syndrome -Continue monitoring  Dementia without behavioral disturbance: Oriented x4 except date and year. -Reorientation and delirium precautions.  Unknown liver cirrhosis: Likely alcoholic.  Daughter concerned about his wine  consumption.  Followed by [facility]. -Encouraged alcohol cessation  Rheumatoid arthritis:  Skyrizi injection every 12 weeks. -Outpatient follow-up  Essential hypertension: BP slightly elevated -Hold home losartan in the setting of orthostasis.  BPH -Continue home Avodart  Increased nutrient needs Body mass index is 25.14 kg/m. -Consult dietitian -Start multivitamin   DVT prophylaxis:  Place TED hose Start: 12/20/21 1514 enoxaparin (LOVENOX) injection 40 mg Start: 12/20/21 1000  Code Status: Full code Family Communication: Updated patient's daughter at bedside. Level of care: Telemetry Status is: Observation The patient will require care spanning > 2 midnights and should be moved to inpatient because: Near syncope with severe TAVR stenosis   Final disposition: TBD Consultants:  Cardiology  Sch Meds:  Scheduled Meds:  aspirin  81 mg Oral Daily   dutasteride  0.5 mg Oral Daily   enoxaparin (LOVENOX) injection  40 mg Subcutaneous Q24H   escitalopram  20 mg Oral QHS   multivitamin with minerals  1 tablet Oral Daily   pantoprazole  40 mg Oral Daily   sodium chloride flush  3 mL Intravenous Q12H   Continuous Infusions: PRN Meds:.acetaminophen **OR** acetaminophen, melatonin, ondansetron **OR** ondansetron (ZOFRAN) IV, senna-docusate  Antimicrobials: Anti-infectives (From admission, onward)    None        I have personally reviewed the following labs and images: CBC: Recent Labs  Lab 12/19/21 2326  WBC 7.9  HGB 14.5  HCT 43.8  MCV 89.4  PLT 141*   BMP &GFR Recent Labs  Lab 12/19/21 2326  NA 134*  K 3.8  CL 102  CO2 25  GLUCOSE 145*  BUN 19  CREATININE 1.05  CALCIUM 8.6*   Estimated Creatinine Clearance: 44.3 mL/min (by C-G formula based on SCr of 1.05 mg/dL). Liver & Pancreas: Recent Labs  Lab 12/20/21 0010  AST 29  ALT 36  ALKPHOS 55  BILITOT 1.8*  PROT 7.0  ALBUMIN 4.0   Recent Labs  Lab 12/20/21 0010  LIPASE 38   No results  for input(s): "AMMONIA" in the last 168 hours. Diabetic: No results for input(s): "HGBA1C" in the last 72 hours. Recent Labs  Lab 12/20/21 0013 12/20/21 1133  GLUCAP 155* 128*   Cardiac Enzymes: No results for input(s): "CKTOTAL", "CKMB", "CKMBINDEX", "TROPONINI" in the last 168 hours. No results for input(s): "PROBNP" in the last 8760 hours. Coagulation Profile: No results for input(s): "INR", "PROTIME" in the last 168 hours. Thyroid Function Tests: Recent Labs    12/20/21 0012  TSH 6.048*   Lipid Profile: No results for input(s): "CHOL", "HDL", "LDLCALC", "TRIG", "CHOLHDL", "LDLDIRECT" in the last 72 hours. Anemia Panel: No results for input(s): "VITAMINB12", "FOLATE", "FERRITIN", "TIBC", "IRON", "RETICCTPCT" in the last 72 hours. Urine analysis:    Component Value Date/Time   COLORURINE YELLOW 12/20/2021 0408   APPEARANCEUR CLEAR 12/20/2021 0408   LABSPEC 1.019 12/20/2021 0408   PHURINE 5.0 12/20/2021 0408   GLUCOSEU NEGATIVE 12/20/2021 0408   HGBUR NEGATIVE 12/20/2021 0408   BILIRUBINUR NEGATIVE 12/20/2021 0408   KETONESUR NEGATIVE 12/20/2021 0408   PROTEINUR NEGATIVE 12/20/2021 0408  UROBILINOGEN 1.0 04/14/2014 0116   NITRITE NEGATIVE 12/20/2021 0408   LEUKOCYTESUR NEGATIVE 12/20/2021 0408   Sepsis Labs: Invalid input(s): "PROCALCITONIN", "LACTICIDVEN"  Microbiology: Recent Results (from the past 240 hour(s))  Resp Panel by RT-PCR (Flu A&B, Covid) Anterior Nasal Swab     Status: None   Collection Time: 12/20/21 12:10 AM   Specimen: Anterior Nasal Swab  Result Value Ref Range Status   SARS Coronavirus 2 by RT PCR NEGATIVE NEGATIVE Final    Comment: (NOTE) SARS-CoV-2 target nucleic acids are NOT DETECTED.  The SARS-CoV-2 RNA is generally detectable in upper respiratory specimens during the acute phase of infection. The lowest concentration of SARS-CoV-2 viral copies this assay can detect is 138 copies/mL. A negative result does not preclude  SARS-Cov-2 infection and should not be used as the sole basis for treatment or other patient management decisions. A negative result may occur with  improper specimen collection/handling, submission of specimen other than nasopharyngeal swab, presence of viral mutation(s) within the areas targeted by this assay, and inadequate number of viral copies(<138 copies/mL). A negative result must be combined with clinical observations, patient history, and epidemiological information. The expected result is Negative.  Fact Sheet for Patients:  EntrepreneurPulse.com.au  Fact Sheet for Healthcare Providers:  IncredibleEmployment.be  This test is no t yet approved or cleared by the [place] FDA and  has been authorized for detection and/or diagnosis of SARS-CoV-2 by FDA under an Emergency Use Authorization (EUA). This EUA will remain  in effect (meaning this test can be used) for the duration of the COVID-19 declaration under Section 564(b)(1) of the Act, 21 U.S.C.section 360bbb-3(b)(1), unless the authorization is terminated  or revoked sooner.       Influenza A by PCR NEGATIVE NEGATIVE Final   Influenza B by PCR NEGATIVE NEGATIVE Final    Comment: (NOTE) The Xpert Xpress SARS-CoV-2/FLU/RSV plus assay is intended as an aid in the diagnosis of influenza from Nasopharyngeal swab specimens and should not be used as a sole basis for treatment. Nasal washings and aspirates are unacceptable for Xpert Xpress SARS-CoV-2/FLU/RSV testing.  Fact Sheet for Patients: EntrepreneurPulse.com.au  Fact Sheet for Healthcare Providers: IncredibleEmployment.be  This test is not yet approved or cleared by the [place] FDA and has been authorized for detection and/or diagnosis of SARS-CoV-2 by FDA under an Emergency Use Authorization (EUA). This EUA will remain in effect (meaning this test can be used) for the duration of  the COVID-19 declaration under Section 564(b)(1) of the Act, 21 U.S.C. section 360bbb-3(b)(1), unless the authorization is terminated or revoked.  Performed at [facility], [place] [address] [place], [place] [address]     Radiology Studies: ECHOCARDIOGRAM COMPLETE  Result Date: 12/20/2021    ECHOCARDIOGRAM REPORT   Patient Name:   GARRETH BURNSWORTH Date of Exam: 12/20/2021 Medical Rec #:  [id number]     Height:       68.0 in Accession #:    [id number]    Weight:       165.3 lb Date of Birth:  May 25, 1928      BSA:          1.885 m Patient Age:    36 years      BP:           157/60 mmHg Patient Gender: M             HR:           53 bpm. Exam Location:  Inpatient Procedure: 2D  Echo, Cardiac Doppler, Color Doppler and Strain Analysis Indications:     R55 Syncope  History:         Patient has prior history of Echocardiogram examinations, most                  recent 05/18/2021. CHF, Abnormal ECG, Aortic Valve Disease,                  Arrythmias:Bradycardia, Signs/Symptoms:Altered Mental Status,                  Alzheimer's, Syncope and Chest Pain; Risk Factors:Hypertension,                  Dyslipidemia and Former Smoker. Severe aortic stenosis.                  Aortic Valve: 23 mm Edwards Sapien prosthetic, stented (TAVR)                  valve is present in the aortic position.  Sonographer:     Roseanna Rainbow RDCS Referring Phys:  [id number] Ilene Qua OPYD Diagnosing Phys: Oswaldo Milian MD  Sonographer Comments: Global longitudinal strain was attempted. IMPRESSIONS  1. Left ventricular ejection fraction, by estimation, is 70 to 75%. The left ventricle has hyperdynamic function. The left ventricle has no regional wall motion abnormalities. There is mild left ventricular hypertrophy. Left ventricular diastolic parameters are indeterminate.  2. Right ventricular systolic function is normal. The right ventricular size is normal. There is normal pulmonary artery systolic pressure. The estimated  right ventricular systolic pressure is 45.4 mmHg.  3. The mitral valve is normal in structure. No evidence of mitral valve regurgitation. No evidence of mitral stenosis.  4. The inferior vena cava is normal in size with greater than 50% respiratory variability, suggesting right atrial pressure of 3 mmHg.  5. There is a 23 mm Edwards Sapien prosthetic (TAVR) valve present in the aortic position. Mild perivalvular leak. Vmax 4.5 m/s, MG 45 mmHg, EOA 1.5 cm^2, DI 0.38, AT 56m. Marked increase in gradients through AV compared to prior echo (116mg on 05/18/21). AT is low, and has increased LVOT gradient, suggesting high flow state contributing to elevated AV gradients, though not enough to explain his MG 4587m. Prosthetic valve is not well visualized, would recommend TEE to better assess etiology of increased gradients FINDINGS  Left Ventricle: Left ventricular ejection fraction, by estimation, is 70 to 75%. The left ventricle has hyperdynamic function. The left ventricle has no regional wall motion abnormalities. The left ventricular internal cavity size was normal in size. There is mild left ventricular hypertrophy. Left ventricular diastolic parameters are indeterminate. Right Ventricle: The right ventricular size is normal. No increase in right ventricular wall thickness. Right ventricular systolic function is normal. There is normal pulmonary artery systolic pressure. The tricuspid regurgitant velocity is 2.33 m/s, and  with an assumed right atrial pressure of 3 mmHg, the estimated right ventricular systolic pressure is 24.09.8Hg. Left Atrium: Left atrial size was normal in size. Right Atrium: Right atrial size was normal in size. Pericardium: Trivial pericardial effusion is present. Mitral Valve: The mitral valve is normal in structure. No evidence of mitral valve regurgitation. No evidence of mitral valve stenosis. MV peak gradient, 8.8 mmHg. The mean mitral valve gradient is 2.0 mmHg. Tricuspid Valve: The  tricuspid valve is normal in structure. Tricuspid valve regurgitation is trivial. Aortic Valve: The aortic valve has been repaired/replaced. Aortic valve regurgitation is mild. Aortic  regurgitation PHT measures 769 msec. Aortic valve mean gradient measures 39.1 mmHg. Aortic valve peak gradient measures 72.0 mmHg. Aortic valve area, by  VTI measures 1.66 cm. There is a 23 mm Edwards Sapien prosthetic, stented (TAVR) valve present in the aortic position. Pulmonic Valve: The pulmonic valve was not well visualized. Pulmonic valve regurgitation is not visualized. Aorta: The aortic root and ascending aorta are structurally normal, with no evidence of dilitation. Venous: The inferior vena cava is normal in size with greater than 50% respiratory variability, suggesting right atrial pressure of 3 mmHg. IAS/Shunts: The interatrial septum was not well visualized.  LEFT VENTRICLE PLAX 2D LVIDd:         4.20 cm     Diastology LVIDs:         2.60 cm     LV e' medial:    5.33 cm/s LV PW:         1.00 cm     LV E/e' medial:  15.4 LV IVS:        1.10 cm     LV e' lateral:   5.98 cm/s LVOT diam:     2.30 cm     LV E/e' lateral: 13.7 LV SV:         161 LV SV Index:   85 LVOT Area:     4.15 cm  LV Volumes (MOD) LV vol d, MOD A4C: 77.1 ml LV vol s, MOD A4C: 19.6 ml LV SV MOD A4C:     77.1 ml RIGHT VENTRICLE             IVC RV S prime:     15.70 cm/s  IVC diam: 1.70 cm TAPSE (M-mode): 2.1 cm LEFT ATRIUM             Index        RIGHT ATRIUM           Index LA diam:        2.80 cm 1.49 cm/m   RA Area:     10.40 cm LA Vol (A2C):   35.1 ml 18.62 ml/m  RA Volume:   18.70 ml  9.92 ml/m LA Vol (A4C):   28.6 ml 15.17 ml/m LA Biplane Vol: 32.0 ml 16.98 ml/m  AORTIC VALVE AV Area (Vmax):    1.65 cm AV Area (Vmean):   1.61 cm AV Area (VTI):     1.66 cm AV Vmax:           424.12 cm/s AV Vmean:          288.487 cm/s AV VTI:            0.968 m AV Peak Grad:      72.0 mmHg AV Mean Grad:      39.1 mmHg LVOT Vmax:         168.00 cm/s LVOT  Vmean:        112.000 cm/s LVOT VTI:          0.387 m LVOT/AV VTI ratio: 0.40 AI PHT:            769 msec  AORTA Ao Root diam: 2.70 cm Ao Asc diam:  3.60 cm MITRAL VALVE                TRICUSPID VALVE MV Area (PHT): 2.16 cm     TR Peak grad:   21.7 mmHg MV Area VTI:   3.01 cm     TR Vmax:        233.00  cm/s MV Peak grad:  8.8 mmHg MV Mean grad:  2.0 mmHg     SHUNTS MV Vmax:       1.48 m/s     Systemic VTI:  0.39 m MV Vmean:      63.6 cm/s    Systemic Diam: 2.30 cm MV Decel Time: 352 msec MV E velocity: 82.00 cm/s MV A velocity: 144.00 cm/s MV E/A ratio:  0.57 Oswaldo Milian MD Electronically signed by Oswaldo Milian MD Signature Date/Time: 12/20/2021/3:58:26 PM    Final (Updated)    CT Head Wo Contrast  Result Date: 12/20/2021 CLINICAL DATA:  Weakness near syncopal episode EXAM: CT HEAD WITHOUT CONTRAST TECHNIQUE: Contiguous axial images were obtained from the base of the skull through the vertex without intravenous contrast. RADIATION DOSE REDUCTION: This exam was performed according to the departmental dose-optimization program which includes automated exposure control, adjustment of the mA and/or kV according to patient size and/or use of iterative reconstruction technique. COMPARISON:  CT brain 10/04/2019 FINDINGS: Brain: No acute territorial infarction, hemorrhage or intracranial mass. Atrophy and chronic small vessel ischemic changes of the white matter. Chronic appearing lacunar infarcts within the bilateral basal ganglia. Stable ventricle size. Vascular: No hyperdense vessels.  Carotid vascular calcification Skull: Normal. Negative for fracture or focal lesion. Sinuses/Orbits: No acute finding. Other: None IMPRESSION: 1. No CT evidence for acute intracranial abnormality. 2. Atrophy and chronic small vessel ischemic changes of the white matter Electronically Signed   By: Donavan Foil M.D.   On: 12/20/2021 00:47   DG Chest 2 View  Result Date: 12/20/2021 CLINICAL DATA:  Weakness EXAM:  CHEST - 2 VIEW COMPARISON:  09/14/2021 FINDINGS: Cardiac shadow is within normal limits. Lungs are well aerated bilaterally. No focal infiltrate or effusion is seen. Degenerative changes of the thoracic spine are noted. Changes of prior TAVR are seen. IMPRESSION: No active cardiopulmonary disease. Electronically Signed   By: Inez Catalina M.D.   On: 12/20/2021 00:43      Janequa Kipnis T. [place]  If 7PM-7AM, please contact night-coverage [email or web address] 12/20/2021, 4:20 PM

## 2021-12-20 NOTE — TOC Initial Note (Signed)
Transition of Care [facility]) - Initial/Assessment Note    Patient Details  Name: [name] MRN: [id number] Date of Birth: [birth year]  Transition of Care [facility]) CM/SW Contact:    Vassie Moselle, LCSW Phone Number: 12/20/2021, 4:20 PM  Clinical Narrative:                 Met with pt and family at bedside. Per family pt lives alone and has a [facility] aide that comes in for 2hr/day. Family is agreeable to HHPT/OT being set up for pt. Pt's family have concern for pt living alone at his current level of functioning. Pt's family are interested in CAP for this pt. CSW currently working to have [facility] services set up for this pt.   Expected Discharge Plan: [place] Barriers to Discharge: Continued Medical Work up   Patient Goals and CMS Choice Patient states their goals for this hospitalization and ongoing recovery are:: To return home   Choice offered to / list presented to : Patient, Adult Children  Expected Discharge Plan and Services Expected Discharge Plan: [place] In-house Referral: Clinical Social Work Discharge Planning Services: CM Consult Post Acute Care Choice: Home Health Living arrangements for the past 2 months: Single Family Home Expected Discharge Date: 12/20/21               DME Arranged: N/A DME Agency: NA                  Prior Living Arrangements/Services Living arrangements for the past 2 months: Single Family Home Lives with:: Self Patient language and need for interpreter reviewed:: Yes Do you feel safe going back to the place where you live?: Yes      Need for Family Participation in Patient Care: Yes (Comment) Care giver support system in place?: Yes (comment) Current home services: DME, Homehealth aide Criminal Activity/Legal Involvement Pertinent to Current Situation/Hospitalization: No - Comment as needed  Activities of Daily Living Home Assistive Devices/Equipment: None ADL Screening (condition at time of  admission) Patient's cognitive ability adequate to safely complete daily activities?: No Is the patient deaf or have difficulty hearing?: No Does the patient have difficulty seeing, even when wearing glasses/contacts?: No Does the patient have difficulty concentrating, remembering, or making decisions?: Yes Patient able to express need for assistance with ADLs?: Yes Does the patient have difficulty dressing or bathing?: Yes Independently performs ADLs?: No Communication: Independent Dressing (OT): Needs assistance Is this a change from baseline?: Pre-admission baseline Grooming: Needs assistance Is this a change from baseline?: Pre-admission baseline Feeding: Needs assistance Is this a change from baseline?: Pre-admission baseline Bathing: Needs assistance Is this a change from baseline?: Pre-admission baseline Toileting: Needs assistance Is this a change from baseline?: Pre-admission baseline In/Out Bed: Needs assistance Is this a change from baseline?: Pre-admission baseline Walks in Home: Needs assistance Is this a change from baseline?: Pre-admission baseline Does the patient have difficulty walking or climbing stairs?: Yes Weakness of Legs: Both Weakness of Arms/Hands: None  Permission Sought/Granted Permission sought to share information with : Facility Sport and exercise psychologist, Family Supports Permission granted to share information with : Yes, Verbal Permission Granted  Share Information with NAME: Isrrael Fluckiger     Permission granted to share info w Relationship: Daughter  Permission granted to share info w Contact Information: [phone]  Emotional Assessment Appearance:: Appears younger than stated age Attitude/Demeanor/Rapport: Unable to Assess Affect (typically observed): Calm Orientation: : Oriented to Self, Oriented to Place, Oriented to  Time Alcohol / Substance Use: Not Applicable Psych Involvement: No (comment)  Admission diagnosis:  Near syncope  [R55] Nausea and vomiting, unspecified vomiting type [R11.2] Patient Active Problem List   Diagnosis Date Noted   Near syncope 12/20/2021   Dementia ([place]) 12/20/2021   Unspecified cirrhosis of liver ([place]) 12/20/2021   Hyperbilirubinemia 12/20/2021   Syncope 10/04/2019   Abdominal pain 10/09/2018   CKD (chronic kidney disease), stage II 10/09/2018   RUQ pain    Elevated LFTs 09/22/2018   Inguinal hernia of right side without obstruction or gangrene 08/22/2018   Chronic diastolic CHF (congestive heart failure) ([place]) 07/24/2018   Left-sided carotid artery disease ([place]) 07/24/2018   Chest pain 06/05/2018   S/P TAVR (transcatheter aortic valve replacement) 03/27/2018   RA (rheumatoid arthritis) ([place])    HOH (hard of hearing)    GERD (gastroesophageal reflux disease)    Benign prostatic hyperplasia    HLD (hyperlipidemia)    Depression    Diverticulosis 02/07/2018   Severe aortic stenosis    Benign essential HTN    Musculoskeletal chest pain 08/20/2016   Neoplasm of uncertain behavior of thyroid gland, 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   Sinus bradycardia 06/20/2014   PCP:  Lajean Manes, MD Pharmacy:   [facility] [place], [place] AT [facility] [address] [address] Phone: [phone] Fax: [phone]  [facility] [phone] [place], [place] - [address] [place] [address] Phone: [phone] Fax: [place], [place] [address] [place] [address] Phone: [phone] Fax: [phone]  [facility] ([facility] ) - [place], [address] [place] [address] Phone: [phone] Fax: [phone]     Social Determinants of Health (SDOH) Interventions    Readmission Risk Interventions     No data to display

## 2021-12-20 NOTE — H&P (Addendum)
History and Physical    Dreshaun Stene OHY:[id number] DOB: 05-21-1928 DOA: 12/19/2021  PCP: Lajean Manes, MD   Patient coming from: Home   Chief Complaint: Weakness, N/V   HPI: Azure Barrales is a pleasant 88 y.o. male with medical history significant for mild nonobstructive CAD, severe AS status post TAVR, hypertension, RA, and dementia, presenting to the emergency department after an episode of near syncope.  He is accompanied by both of his daughters who assist with the history.  Patient lives alone with his daughter a few doors down and is able to ambulate and perform his ADLs at baseline, but was generally weak and fatigued yesterday and required assistance getting back home after he had gone for a walk.  He complained of some lightheadedness and nausea, had not eaten anything, then ate some crackers and had a cup of tea, but this was followed by nausea with vomiting, diaphoresis, pallor, and then a near syncopal episode followed by a period of increased fatigue.  He denied any chest pain or palpitations.  No abdominal pain, fever, or diarrhea.  ED Course: Upon arrival to the ED, patient is found to be afebrile and saturating mid 90s on room air with stable blood pressure.  EKG features sinus rhythm with rate 53, chest x-ray is negative for acute cardiopulmonary disease, and head CT negative for acute intracranial abnormality.  Troponin was normal.  Patient was given 500 cc of saline and Zofran in the ED.  Review of Systems:  All other systems reviewed and apart from HPI, are negative.  Past Medical History:  Diagnosis Date   BPH (benign prostatic hypertrophy)    Depression    Diverticulosis of colon    GERD (gastroesophageal reflux disease)    Gilbert's syndrome    History of kidney stones    HLD (hyperlipidemia)    HOH (hard of hearing)    refuses to wear his Hearing Aid   Hypertension    Inguinal hernia    right   Internal hemorrhoid    Normal coronary arteries    a. Cath  07/2010: normal coronaries. Felt to have noncardiac CP/SOB at that time possibly r/t anxiety.   RA (rheumatoid arthritis) (HCC)    bilateral hands/ wrist--  seronegative   S/P TAVR (transcatheter aortic valve replacement) 03/27/2018   23 mm Edwards Sapien 3 transcatheter heart valve placed via percutaneous right transfemoral approach    Severe aortic stenosis    Thyroid nodule    noted 11-2009   Wears dentures     Past Surgical History:  Procedure Laterality Date   CARDIAC CATHETERIZATION  07-19-2010  dr Tressia Miners turner   normal coronary arteries,  ef 60%,  moderate aortic stenosis- gradient 59mHg, normal right heart pressure   CARDIOVASCULAR STRESS TEST  05/ 2011   dr sMarlou Porch  normal low risk perfusion study   CATARACT EXTRACTION W/ INTRAOCULAR LENS  IMPLANT, BILATERAL  2013   CATARACT EXTRACTION, BILATERAL     CHOLECYSTECTOMY N/A 10/11/2018   Procedure: LAPAROSCOPIC CHOLECYSTECTOMY WITH INTRAOPERATIVE CHOLANGIOGRAM;  Surgeon: TJovita Kussmaul MD;  [facility];  Service: General;  Laterality: N/A;   COLONOSCOPY  2010  approx   ERCP N/A 09/25/2018   Procedure: ENDOSCOPIC RETROGRADE CHOLANGIOPANCREATOGRAPHY (ERCP);  Surgeon: MClarene Essex MD;  [facility] [facility]  Service: Endoscopy;  Laterality: N/A;   INGUINAL HERNIA REPAIR Right 08/23/2018   Procedure: OPEN RIGHT INGUINAL HERNIA REPAIR WITH MESH;  Surgeon: GArmandina Gemma MD;  Location: [facility];  Service: General;  Laterality: Right;   INTRAOPERATIVE TRANSTHORACIC ECHOCARDIOGRAM N/A 03/27/2018   Procedure: INTRAOPERATIVE TRANSTHORACIC ECHOCARDIOGRAM;  Surgeon: Burnell Blanks, MD;  Location: [place];  Service: Open Heart Surgery;  Laterality: N/A;   REMOVAL OF STONES  09/25/2018   Procedure: REMOVAL OF STONES;  Surgeon: Clarene Essex, MD;  Location: [place];  Service: Endoscopy;;   RIGHT/LEFT HEART CATH AND CORONARY ANGIOGRAPHY N/A 02/06/2018   Procedure: RIGHT/LEFT HEART CATH AND CORONARY ANGIOGRAPHY;  Surgeon: Burnell Blanks, MD;  Location: [place] [facility];  Service: Cardiovascular;  Laterality: N/A;   SPHINCTEROTOMY  09/25/2018   Procedure: SPHINCTEROTOMY;  Surgeon: Clarene Essex, MD;  Location: [place];  Service: Endoscopy;;   THYROID LOBECTOMY Right 05/05/2015   Procedure: RIGHT THYROID LOBECTOMY;  Surgeon: Armandina Gemma, MD;  Location: [place];  Service: General;  Laterality: Right;   TRANSANAL HEMORRHOIDAL DEARTERIALIZATION N/A 04/09/2014   Procedure: TRANSANAL HEMORRHOIDAL DEARTERIALIZATION OF INTERNAL HEMORROIDS;  Surgeon: Leighton Ruff, MD;  Location: [facility];  Service: General;  Laterality: N/A;   TRANSCATHETER AORTIC VALVE REPLACEMENT, TRANSFEMORAL  03/27/2018   TRANSCATHETER AORTIC VALVE REPLACEMENT, TRANSFEMORAL N/A 03/27/2018   Procedure: TRANSCATHETER AORTIC VALVE REPLACEMENT, TRANSFEMORAL. Edwards SAPIEN 3 Transcatheter Heart Valve 4m.;  Surgeon: MBurnell Blanks MD;  Location[place] [facility]  Service: Open Heart Surgery;  Laterality: N/A;   TRANSTHORACIC ECHOCARDIOGRAM  11-26-2013   moderate focal basal and mild LVH/  ef 692-33%/ grade I diastolic dysfunction/ mild LAE/  moderate calcification with stenosis AV with mild regurg,  gradients 35 abd 58 mmHg /  mild TR    Social History:   reports that he quit smoking about 40 years ago. His smoking use included cigarettes. He started smoking about 48 years ago. He has never used smokeless tobacco. He reports current alcohol use. He reports that he does not use drugs.  Allergies  Allergen Reactions   Ativan [Lorazepam] Other (See Comments)    hallucinations   Phenergan [Promethazine Hcl] Hypertension    hallucinations   Eggs Or Egg-Derived Products Rash    Small rash after eating for several days in a row    Family History  Problem Relation Age of Onset   Pulmonary embolism Mother 458      caused by complications of surgery   CVA Father    Diabetes Brother    Diabetes Brother    Breast cancer Sister       Prior to Admission medications   Medication Sig Start Date End Date Taking? Authorizing Provider  aspirin 81 MG tablet Take 1 tablet (81 mg total) by mouth daily. Patient taking differently: Take 81 mg by mouth daily. 06/05/18  Yes Barrett, REvelene Croon PA-C  dutasteride (AVODART) 0.5 MG capsule Take 0.5 mg by mouth daily.   Yes [provider]  escitalopram (LEXAPRO) 20 MG tablet Take 1 tablet (20 mg total) by mouth daily. Patient taking differently: Take 20 mg by mouth at bedtime. 09/06/16  Yes Angiulli, DLavon Paganini PA-C  losartan (COZAAR) 25 MG tablet Take 25 mg by mouth daily.  01/21/18  Yes [provider]  Melatonin 5 MG TABS Take 10-15 mg by mouth at bedtime as needed (for sleep).   Yes [provider]  pantoprazole (PROTONIX) 40 MG tablet Take 1 tablet (40 mg total) by mouth daily. 09/27/18  Yes Vann, Jessica U, DO  Risankizumab-rzaa (SKYRIZI PEN) 150 MG/ML SOAJ Inject 150 mg into the skin as directed. Every 12 weeks for maintenance. 03/09/21  Yes Tafeen,  Tressie Ellis, MD  clobetasol cream (TEMOVATE) 6.14 % Apply 1 application. topically 2 (two) times daily. Patient not taking: Reported on 12/20/2021 09/29/21   Lavonna Monarch, MD    Physical Exam: Vitals:   12/20/21 0300 12/20/21 0345 12/20/21 0400 12/20/21 0430  BP: (!) 164/64 128/67 (!) 122/46 (!) 112/40  Pulse: (!) 54 (!) 53 (!) 54 (!) 51  Resp: '18 16 16 17  '$ Temp:      TempSrc:      SpO2: 96% 96% 94% 94%  Weight:      Height:        Constitutional: NAD, calm  Eyes: PERTLA, lids and conjunctivae normal ENMT: Mucous membranes are moist. Posterior pharynx clear of any exudate or lesions.   Neck: supple, no masses  Respiratory:  no wheezing, no crackles. No accessory muscle use.  Cardiovascular: S1 & S2 heard, regular rate and rhythm. No extremity edema.   Abdomen: No distension, no tenderness, soft. Bowel sounds active.  Musculoskeletal: no clubbing / cyanosis. No joint deformity upper and lower  extremities.   Skin: no significant rashes, lesions, ulcers. Warm, dry, well-perfused. Neurologic: CN 2-12 grossly intact. Sensation to light touch intact. Strength 5/5 in all 4 limbs. Alert and oriented.  Psychiatric: Pleasant. Cooperative.    Labs and Imaging on Admission: I have personally reviewed following labs and imaging studies  CBC: Recent Labs  Lab 12/19/21 2326  WBC 7.9  HGB 14.5  HCT 43.8  MCV 89.4  PLT 431*   Basic Metabolic Panel: Recent Labs  Lab 12/19/21 2326  NA 134*  K 3.8  CL 102  CO2 25  GLUCOSE 145*  BUN 19  CREATININE 1.05  CALCIUM 8.6*   GFR: Estimated Creatinine Clearance: 44.3 mL/min (by C-G formula based on SCr of 1.05 mg/dL). Liver Function Tests: Recent Labs  Lab 12/20/21 0010  AST 29  ALT 36  ALKPHOS 55  BILITOT 1.8*  PROT 7.0  ALBUMIN 4.0   Recent Labs  Lab 12/20/21 0010  LIPASE 38   No results for input(s): "AMMONIA" in the last 168 hours. Coagulation Profile: No results for input(s): "INR", "PROTIME" in the last 168 hours. Cardiac Enzymes: No results for input(s): "CKTOTAL", "CKMB", "CKMBINDEX", "TROPONINI" in the last 168 hours. BNP (last 3 results) No results for input(s): "PROBNP" in the last 8760 hours. HbA1C: No results for input(s): "HGBA1C" in the last 72 hours. CBG: Recent Labs  Lab 12/20/21 0013  GLUCAP 155*   Lipid Profile: No results for input(s): "CHOL", "HDL", "LDLCALC", "TRIG", "CHOLHDL", "LDLDIRECT" in the last 72 hours. Thyroid Function Tests: No results for input(s): "TSH", "T4TOTAL", "FREET4", "T3FREE", "THYROIDAB" in the last 72 hours. Anemia Panel: No results for input(s): "VITAMINB12", "FOLATE", "FERRITIN", "TIBC", "IRON", "RETICCTPCT" in the last 72 hours. Urine analysis:    Component Value Date/Time   COLORURINE YELLOW 12/20/2021 0408   APPEARANCEUR CLEAR 12/20/2021 0408   LABSPEC 1.019 12/20/2021 0408   PHURINE 5.0 12/20/2021 0408   GLUCOSEU NEGATIVE 12/20/2021 0408   HGBUR NEGATIVE  12/20/2021 0408   BILIRUBINUR NEGATIVE 12/20/2021 0408   KETONESUR NEGATIVE 12/20/2021 0408   PROTEINUR NEGATIVE 12/20/2021 0408   UROBILINOGEN 1.0 04/14/2014 0116   NITRITE NEGATIVE 12/20/2021 0408   LEUKOCYTESUR NEGATIVE 12/20/2021 0408   Sepsis Labs: '@LABRCNTIP'$ (procalcitonin:4,lacticidven:4) ) Recent Results (from the past 240 hour(s))  Resp Panel by RT-PCR (Flu A&B, Covid) Anterior Nasal Swab     Status: None   Collection Time: 12/20/21 12:10 AM   Specimen: Anterior Nasal Swab  Result Value Ref  Range Status   SARS Coronavirus 2 by RT PCR NEGATIVE NEGATIVE Final    Comment: (NOTE) SARS-CoV-2 target nucleic acids are NOT DETECTED.  The SARS-CoV-2 RNA is generally detectable in upper respiratory specimens during the acute phase of infection. The lowest concentration of SARS-CoV-2 viral copies this assay can detect is 138 copies/mL. A negative result does not preclude SARS-Cov-2 infection and should not be used as the sole basis for treatment or other patient management decisions. A negative result may occur with  improper specimen collection/handling, submission of specimen other than nasopharyngeal swab, presence of viral mutation(s) within the areas targeted by this assay, and inadequate number of viral copies(<138 copies/mL). A negative result must be combined with clinical observations, patient history, and epidemiological information. The expected result is Negative.  Fact Sheet for Patients:  EntrepreneurPulse.com.au  Fact Sheet for Healthcare Providers:  IncredibleEmployment.be  This test is no t yet approved or cleared by the [place] FDA and  has been authorized for detection and/or diagnosis of SARS-CoV-2 by FDA under an Emergency Use Authorization (EUA). This EUA will remain  in effect (meaning this test can be used) for the duration of the COVID-19 declaration under Section 564(b)(1) of the Act, 21 U.S.C.section  360bbb-3(b)(1), unless the authorization is terminated  or revoked sooner.       Influenza A by PCR NEGATIVE NEGATIVE Final   Influenza B by PCR NEGATIVE NEGATIVE Final    Comment: (NOTE) The Xpert Xpress SARS-CoV-2/FLU/RSV plus assay is intended as an aid in the diagnosis of influenza from Nasopharyngeal swab specimens and should not be used as a sole basis for treatment. Nasal washings and aspirates are unacceptable for Xpert Xpress SARS-CoV-2/FLU/RSV testing.  Fact Sheet for Patients: EntrepreneurPulse.com.au  Fact Sheet for Healthcare Providers: IncredibleEmployment.be  This test is not yet approved or cleared by the [place] FDA and has been authorized for detection and/or diagnosis of SARS-CoV-2 by FDA under an Emergency Use Authorization (EUA). This EUA will remain in effect (meaning this test can be used) for the duration of the COVID-19 declaration under Section 564(b)(1) of the Act, 21 U.S.C. section 360bbb-3(b)(1), unless the authorization is terminated or revoked.  Performed at [facility], [place] [address], [place],  [address]      Radiological Exams on Admission: CT Head Wo Contrast  Result Date: 12/20/2021 CLINICAL DATA:  Weakness near syncopal episode EXAM: CT HEAD WITHOUT CONTRAST TECHNIQUE: Contiguous axial images were obtained from the base of the skull through the vertex without intravenous contrast. RADIATION DOSE REDUCTION: This exam was performed according to the departmental dose-optimization program which includes automated exposure control, adjustment of the mA and/or kV according to patient size and/or use of iterative reconstruction technique. COMPARISON:  CT brain 10/04/2019 FINDINGS: Brain: No acute territorial infarction, hemorrhage or intracranial mass. Atrophy and chronic small vessel ischemic changes of the white matter. Chronic appearing lacunar infarcts within the bilateral basal  ganglia. Stable ventricle size. Vascular: No hyperdense vessels.  Carotid vascular calcification Skull: Normal. Negative for fracture or focal lesion. Sinuses/Orbits: No acute finding. Other: None IMPRESSION: 1. No CT evidence for acute intracranial abnormality. 2. Atrophy and chronic small vessel ischemic changes of the white matter Electronically Signed   By: Donavan Foil M.D.   On: 12/20/2021 00:47   DG Chest 2 View  Result Date: 12/20/2021 CLINICAL DATA:  Weakness EXAM: CHEST - 2 VIEW COMPARISON:  09/14/2021 FINDINGS: Cardiac shadow is within normal limits. Lungs are well aerated bilaterally. No focal infiltrate  or effusion is seen. Degenerative changes of the thoracic spine are noted. Changes of prior TAVR are seen. IMPRESSION: No active cardiopulmonary disease. Electronically Signed   By: Inez Catalina M.D.   On: 12/20/2021 00:43    EKG: Independently reviewed. Sinus bradycardia, rate 53.   Assessment/Plan   1. Near-syncope  - Presents after an episode of near-syncope  - There was associated N/V, pallor, and diaphoresis followed by fatigue most suggestive of vasovagal etiology  - Check orthostatic vitals, continue cardiac monitoring, check echocardiogram    2. HTN  - Continue losartan    3. CKD II  - SCr is 1.05 on admission, appears to be at baseline  - Renally-dose medications    4. Dementia  - Able to ambulate and perform ADLs independently at baseline, lives alone with daughter a few doors down  - Delirium precautions     DVT prophylaxis: Lovenox  Code Status: Full  Level of Care: Level of care: Telemetry Family Communication: Both of his daughters at bedside  Disposition Plan:  Patient is from: home  Anticipated d/c is to: TBD Anticipated d/c date is: 12/21/21  Patient currently: Pending orthostatic vitals, cardiac monitoring, echocardiogram  Consults called: none  Admission status: Observation     Vianne Bulls, MD [facility]  12/20/2021, 5:09 AM

## 2021-12-20 NOTE — Evaluation (Signed)
Physical Therapy Evaluation Patient Details Name: [name] MRN: [id number] DOB: 1928/05/13 Today's Date: 12/20/2021  History of Present Illness  Patient is a 88 year old male who presented to the hosptial after near syncopal episode.  PMH: chronic diastolic heart failure, HTN, s/p TAVR, diverticulosis, GERD, gilbert's syndrome, HOH, rheumatoid arthritis, CKD  Clinical Impression  ON eval, pt was Min guard-Min A for mobility. He walked in the hallway without use of an assistive device. Mildly unsteady but no overt LOB. He reported mild lightheadedness. No family was present during session. Recommend HHPT f/u to ensure safe transition back into home environment, if pt and family are agreeable.        Recommendations for follow up therapy are one component of a multi-disciplinary discharge planning process, led by the attending physician.  Recommendations may be updated based on patient status, additional functional criteria and insurance authorization.  Follow Up Recommendations Home health PT (if pt/family are agreeable.)      Assistance Recommended at Discharge PRN  Patient can return home with the following  A little help with walking and/or transfers;Assist for transportation    Equipment Recommendations None recommended by PT  Recommendations for Other Services       Functional Status Assessment Patient has had a recent decline in their functional status and demonstrates the ability to make significant improvements in function in a reasonable and predictable amount of time.     Precautions / Restrictions Precautions Precautions: Fall Precaution Comments: monitor vitals Restrictions Weight Bearing Restrictions: No      Mobility  Bed Mobility Overal bed mobility: Needs Assistance Bed Mobility: Supine to Sit, Sit to Supine     Supine to sit: Min assist Sit to supine: Supervision   General bed mobility comments: some assist for trunk to upright. increased time.     Transfers Overall transfer level: Needs assistance   Transfers: Sit to/from Stand Sit to Stand: Min guard           General transfer comment: min guard for safety. pt denied dizziness.    Ambulation/Gait Ambulation/Gait assistance: Min guard Gait Distance (Feet): 115 Feet Assistive device: None Gait Pattern/deviations: Step-through pattern, Decreased stride length       General Gait Details: intermittent mild unsteadiness but no overt LOB. Slow gait speed. Pt reported mild lightheadedness but he was able to return safely to room  Stairs            Wheelchair Mobility    Modified Rankin (Stroke Patients Only)       Balance Overall balance assessment: Mild deficits observed, not formally tested                                           Pertinent Vitals/Pain Pain Assessment Pain Assessment: No/denies pain    Home Living Family/patient expects to be discharged to:: Private residence Living Arrangements: Children                      Prior Function                       Hand Dominance        Extremity/Trunk Assessment   Upper Extremity Assessment Upper Extremity Assessment: Defer to OT evaluation    Lower Extremity Assessment Lower Extremity Assessment: Generalized weakness    Cervical / Trunk Assessment Cervical / Trunk  Assessment: Normal  Communication      Cognition Arousal/Alertness: Awake/alert Behavior During Therapy: WFL for tasks assessed/performed Overall Cognitive Status: Difficult to assess                                          General Comments      Exercises     Assessment/Plan    PT Assessment Patient needs continued PT services  PT Problem List Decreased strength;Decreased balance;Decreased activity tolerance;Decreased mobility       PT Treatment Interventions DME instruction;Gait training;Functional mobility training;Therapeutic activities;Balance  training;Patient/family education;Therapeutic exercise    PT Goals (Current goals can be found in the Care Plan section)  Acute Rehab PT Goals Patient Stated Goal: none stated PT Goal Formulation: With patient Time For Goal Achievement: 01/03/22 Potential to Achieve Goals: Good    Frequency Min 3X/week     Co-evaluation               AM-PAC PT "6 Clicks" Mobility  Outcome Measure Help needed turning from your back to your side while in a flat bed without using bedrails?: A Little Help needed moving from lying on your back to sitting on the side of a flat bed without using bedrails?: A Little Help needed moving to and from a bed to a chair (including a wheelchair)?: A Little Help needed standing up from a chair using your arms (e.g., wheelchair or bedside chair)?: A Little Help needed to walk in hospital room?: A Little Help needed climbing 3-5 steps with a railing? : A Little 6 Click Score: 18    End of Session Equipment Utilized During Treatment: Gait belt Activity Tolerance: Patient tolerated treatment well Patient left: in bed;with call bell/phone within reach;with bed alarm set   PT Visit Diagnosis: Unsteadiness on feet (R26.81);Muscle weakness (generalized) (M62.81)    Time: 1430-1440 PT Time Calculation (min) (ACUTE ONLY): 10 min   Charges:   PT Evaluation $PT Eval Low Complexity: [place], PT Acute Rehabilitation  Office: [phone] Pager: [phone]

## 2021-12-20 NOTE — Progress Notes (Signed)
  Echocardiogram 2D Echocardiogram has been performed.  [name] 12/20/2021, 2:25 PM

## 2021-12-21 ENCOUNTER — Observation Stay (HOSPITAL_COMMUNITY): Payer: Medicare Other

## 2021-12-21 DIAGNOSIS — I350 Nonrheumatic aortic (valve) stenosis: Secondary | ICD-10-CM | POA: Diagnosis not present

## 2021-12-21 DIAGNOSIS — T82857A Stenosis of cardiac prosthetic devices, implants and grafts, initial encounter: Secondary | ICD-10-CM | POA: Diagnosis present

## 2021-12-21 DIAGNOSIS — H9193 Unspecified hearing loss, bilateral: Secondary | ICD-10-CM | POA: Diagnosis present

## 2021-12-21 DIAGNOSIS — R112 Nausea with vomiting, unspecified: Secondary | ICD-10-CM | POA: Diagnosis present

## 2021-12-21 DIAGNOSIS — I110 Hypertensive heart disease with heart failure: Secondary | ICD-10-CM | POA: Diagnosis not present

## 2021-12-21 DIAGNOSIS — Z87891 Personal history of nicotine dependence: Secondary | ICD-10-CM | POA: Diagnosis not present

## 2021-12-21 DIAGNOSIS — F0390 Unspecified dementia without behavioral disturbance: Secondary | ICD-10-CM | POA: Diagnosis not present

## 2021-12-21 DIAGNOSIS — F32A Depression, unspecified: Secondary | ICD-10-CM | POA: Diagnosis not present

## 2021-12-21 DIAGNOSIS — R29702 NIHSS score 2: Secondary | ICD-10-CM | POA: Diagnosis present

## 2021-12-21 DIAGNOSIS — N400 Enlarged prostate without lower urinary tract symptoms: Secondary | ICD-10-CM | POA: Diagnosis present

## 2021-12-21 DIAGNOSIS — E785 Hyperlipidemia, unspecified: Secondary | ICD-10-CM | POA: Diagnosis present

## 2021-12-21 DIAGNOSIS — K219 Gastro-esophageal reflux disease without esophagitis: Secondary | ICD-10-CM | POA: Diagnosis present

## 2021-12-21 DIAGNOSIS — K7030 Alcoholic cirrhosis of liver without ascites: Secondary | ICD-10-CM | POA: Diagnosis present

## 2021-12-21 DIAGNOSIS — I2510 Atherosclerotic heart disease of native coronary artery without angina pectoris: Secondary | ICD-10-CM | POA: Diagnosis present

## 2021-12-21 DIAGNOSIS — I10 Essential (primary) hypertension: Secondary | ICD-10-CM | POA: Diagnosis not present

## 2021-12-21 DIAGNOSIS — E871 Hypo-osmolality and hyponatremia: Secondary | ICD-10-CM | POA: Diagnosis present

## 2021-12-21 DIAGNOSIS — F03A3 Unspecified dementia, mild, with mood disturbance: Secondary | ICD-10-CM | POA: Diagnosis present

## 2021-12-21 DIAGNOSIS — I6389 Other cerebral infarction: Secondary | ICD-10-CM | POA: Diagnosis present

## 2021-12-21 DIAGNOSIS — I509 Heart failure, unspecified: Secondary | ICD-10-CM | POA: Diagnosis not present

## 2021-12-21 DIAGNOSIS — K7460 Unspecified cirrhosis of liver: Secondary | ICD-10-CM | POA: Diagnosis not present

## 2021-12-21 DIAGNOSIS — R55 Syncope and collapse: Secondary | ICD-10-CM | POA: Diagnosis not present

## 2021-12-21 DIAGNOSIS — R109 Unspecified abdominal pain: Secondary | ICD-10-CM | POA: Diagnosis not present

## 2021-12-21 DIAGNOSIS — I129 Hypertensive chronic kidney disease with stage 1 through stage 4 chronic kidney disease, or unspecified chronic kidney disease: Secondary | ICD-10-CM | POA: Diagnosis present

## 2021-12-21 DIAGNOSIS — R001 Bradycardia, unspecified: Secondary | ICD-10-CM | POA: Diagnosis not present

## 2021-12-21 DIAGNOSIS — Z952 Presence of prosthetic heart valve: Secondary | ICD-10-CM | POA: Diagnosis not present

## 2021-12-21 DIAGNOSIS — Z602 Problems related to living alone: Secondary | ICD-10-CM | POA: Diagnosis present

## 2021-12-21 DIAGNOSIS — T8203XA Leakage of heart valve prosthesis, initial encounter: Secondary | ICD-10-CM | POA: Diagnosis not present

## 2021-12-21 DIAGNOSIS — Z20822 Contact with and (suspected) exposure to covid-19: Secondary | ICD-10-CM | POA: Diagnosis present

## 2021-12-21 DIAGNOSIS — M0600 Rheumatoid arthritis without rheumatoid factor, unspecified site: Secondary | ICD-10-CM | POA: Diagnosis not present

## 2021-12-21 DIAGNOSIS — D696 Thrombocytopenia, unspecified: Secondary | ICD-10-CM | POA: Diagnosis present

## 2021-12-21 DIAGNOSIS — Z953 Presence of xenogenic heart valve: Secondary | ICD-10-CM | POA: Diagnosis not present

## 2021-12-21 DIAGNOSIS — N182 Chronic kidney disease, stage 2 (mild): Secondary | ICD-10-CM | POA: Diagnosis present

## 2021-12-21 DIAGNOSIS — R410 Disorientation, unspecified: Secondary | ICD-10-CM | POA: Diagnosis not present

## 2021-12-21 DIAGNOSIS — Z515 Encounter for palliative care: Secondary | ICD-10-CM | POA: Diagnosis not present

## 2021-12-21 DIAGNOSIS — I639 Cerebral infarction, unspecified: Secondary | ICD-10-CM | POA: Diagnosis not present

## 2021-12-21 DIAGNOSIS — Z7189 Other specified counseling: Secondary | ICD-10-CM | POA: Diagnosis not present

## 2021-12-21 DIAGNOSIS — K5900 Constipation, unspecified: Secondary | ICD-10-CM | POA: Diagnosis present

## 2021-12-21 DIAGNOSIS — T82897A Other specified complication of cardiac prosthetic devices, implants and grafts, initial encounter: Secondary | ICD-10-CM | POA: Diagnosis not present

## 2021-12-21 DIAGNOSIS — Y831 Surgical operation with implant of artificial internal device as the cause of abnormal reaction of the patient, or of later complication, without mention of misadventure at the time of the procedure: Secondary | ICD-10-CM | POA: Diagnosis present

## 2021-12-21 DIAGNOSIS — N200 Calculus of kidney: Secondary | ICD-10-CM | POA: Diagnosis not present

## 2021-12-21 DIAGNOSIS — R531 Weakness: Secondary | ICD-10-CM | POA: Diagnosis not present

## 2021-12-21 DIAGNOSIS — M069 Rheumatoid arthritis, unspecified: Secondary | ICD-10-CM | POA: Diagnosis not present

## 2021-12-21 LAB — GLUCOSE, CAPILLARY: Glucose-Capillary: 108 mg/dL — ABNORMAL HIGH (ref 70–99)

## 2021-12-21 LAB — COMPREHENSIVE METABOLIC PANEL
ALT: 33 U/L (ref 0–44)
AST: 28 U/L (ref 15–41)
Albumin: 3.6 g/dL (ref 3.5–5.0)
Alkaline Phosphatase: 52 U/L (ref 38–126)
Anion gap: 6 (ref 5–15)
BUN: 17 mg/dL (ref 8–23)
CO2: 28 mmol/L (ref 22–32)
Calcium: 8.8 mg/dL — ABNORMAL LOW (ref 8.9–10.3)
Chloride: 104 mmol/L (ref 98–111)
Creatinine, Ser: 0.97 mg/dL (ref 0.61–1.24)
GFR, Estimated: >60 mL/min (ref >60)
Glucose, Bld: 107 mg/dL — ABNORMAL HIGH (ref 70–99)
Potassium: 4.1 mmol/L (ref 3.5–5.1)
Sodium: 138 mmol/L (ref 135–145)
Total Bilirubin: 1.8 mg/dL — ABNORMAL HIGH (ref 0.3–1.2)
Total Protein: 6.5 g/dL (ref 6.5–8.1)

## 2021-12-21 LAB — T4, FREE: Free T4: 1.03 ng/dL (ref 0.61–1.12)

## 2021-12-21 LAB — CBC
HCT: 44.1 % (ref 39.0–52.0)
Hemoglobin: 14.5 g/dL (ref 13.0–17.0)
MCH: 29.5 pg (ref 26.0–34.0)
MCHC: 32.9 g/dL (ref 30.0–36.0)
MCV: 89.6 fL (ref 80.0–100.0)
Platelets: 143 10*3/uL — ABNORMAL LOW (ref 150–400)
RBC: 4.92 MIL/uL (ref 4.22–5.81)
RDW: 12.3 % (ref 11.5–15.5)
WBC: 6.7 10*3/uL (ref 4.0–10.5)
nRBC: 0 % (ref 0.0–0.2)

## 2021-12-21 LAB — MAGNESIUM: Magnesium: 2 mg/dL (ref 1.7–2.4)

## 2021-12-21 LAB — TSH: TSH: 5.61 u[IU]/mL — ABNORMAL HIGH (ref 0.350–4.500)

## 2021-12-21 LAB — PHOSPHORUS: Phosphorus: 3.7 mg/dL (ref 2.5–4.6)

## 2021-12-21 MED ORDER — ORAL CARE MOUTH RINSE: 15.0000 mL | OROMUCOSAL | Status: DC | PRN

## 2021-12-21 NOTE — Progress Notes (Signed)
Carotid artery duplex completed. Refer to "CV Proc" under chart review to view preliminary results.  12/21/2021 12:25 PM Kelby Aline., MHA, RVT, RDCS, RDMS

## 2021-12-21 NOTE — TOC Transition Note (Signed)
Transition of Care [facility]) - CM/SW Discharge Note   Patient Details  Name: Gorman Safi MRN: [id number] Date of Birth: 1928/03/04  [facility] CM/SW Contact:  Vassie Moselle, LCSW Phone Number: 12/21/2021, 10:05 AM   Clinical Narrative:    Pt has been set up with HHPT/OT through [facility]. CSW met with pt and daughter and provided phone number for Medicaid CAP in order to have CAP services set up for pt. No further TOC needs identified. Please consult should further needs arise.   Final next level of care: [place] Barriers to Discharge: Continued Medical Work up   Patient Goals and CMS Choice Patient states their goals for this hospitalization and ongoing recovery are:: To return home   Choice offered to / list presented to : Patient, Adult Children  Discharge Placement                       Discharge Plan and Services In-house Referral: Clinical Social Work Discharge Planning Services: CM Consult Post Acute Care Choice: Home Health          DME Arranged: N/A DME Agency: NA       HH Arranged: PT, OT HH Agency: [facility] Date [place]: 12/21/21 Time HH Agency Contacted: 30 Representative spoke with at [place]: [place] Determinants of Health ([place]) Interventions     Readmission Risk Interventions     No data to display

## 2021-12-21 NOTE — Progress Notes (Signed)
PROGRESS NOTE  [name] YQM:[id number] DOB: [birth year]   PCP: Lajean Manes, MD  Patient is from: Home.  Lives alone.  Family lives close by.  DOA: 12/19/2021 LOS: 0  Chief complaints Chief Complaint  Patient presents with   Near Syncope   Emesis     Brief Narrative / Interim history: 88 year old M with PMH of dementia, nonobstructive CAD, severe AS s/p TAVR, liver cirrhosis, RA, HTN, alcohol abuse, sinus bradycardia and BPH presenting with lightheadedness, nausea, vomiting, diaphoresis and generalized weakness for 1 to 2 days and admitted for near syncope.   In ED, sinus bradycardia to 50s.  BP slightly elevated. Na 134.  Total bili 1.8.  Platelet 141.  Otherwise, CMP and CBC without significant finding.  Lipase normal.  Troponin 8.0.  EKG features sinus bradycardia at 53.  COVID-19 and influenza PCR nonreactive.  CT head and chest x-ray without acute finding.  TTE with markedly increased gradient across TAVR.  TEE scheduled for 7/14.  Carotid ultrasound without significant finding.  Subjective: Seen and examined earlier this morning.  No major events overnight of this morning.  No complaints.  Feels better but has not gotten out of the bed yet.  He was a little bit dizzy when he sat up on the edge of the bed for orthostatic vitals yesterday.  Denies chest pain, shortness of breath, GI or UTI symptoms.  Patient's daughter reports good p.o. intake last night.  Objective: Vitals:   12/21/21 1244 12/21/21 1247 12/21/21 1248 12/21/21 1252  BP: (!) 131/39 (!) 139/56 (!) 115/48 (!) 121/55  Pulse: (!) 52 63 67 70  Resp:      Temp:      TempSrc:      SpO2: 94% 99%    Weight:      Height:        Examination:  GENERAL: No apparent distress.  Nontoxic. HEENT: MMM.  Vision and hearing grossly intact.  NECK: Supple.  No apparent JVD.  RESP:  No IWOB.  Fair aeration bilaterally. CVS:  RRR.  2/6 SEM all over. ABD/GI/GU: BS+. Abd soft, NTND.  MSK/EXT:  Moves extremities. No  apparent deformity. No edema.  SKIN: no apparent skin lesion or wound NEURO: Awake and alert. Oriented x4 except date and year.  No apparent focal neuro deficit. PSYCH: Calm. Normal affect.   Procedures:  None  Microbiology summarized: [id number] and influenza PCR nonreactive.  Assessment and plan: Principal Problem:   Near syncope Active Problems:   Sinus bradycardia   Benign essential HTN   S/P TAVR (transcatheter aortic valve replacement)   RA (rheumatoid arthritis) (HCC)   Benign prostatic hyperplasia   Depression   CKD (chronic kidney disease), stage II   Dementia (HCC)   Unspecified cirrhosis of liver (HCC)   Hyperbilirubinemia   Symptomatic severe aortic stenosis with normal ejection fraction  Near syncope likely due to TAVR stenosis-TTE with marked increase in gradient through AV compared to prior echocardiogram.  He also have sinus bradycardia but with chronotropic response to exertion.  He is not on nodal blocking agents or Aricept.  Carotid US without significant finding.  Orthostatic vitals negative but symptomatic from lying to sitting.  Slightly elevated TSH but normal free T4.  -TEE tentatively scheduled for 7/14. -TED hose/OOB/PT/OT -Fall precaution -Hold home losartan.  History of severe AS s/p TAVR: TTE with marked increase in gradient through AV compared to prior. -Seen near syncope  Sinus bradycardia: Bradycardic to 50s but chronotropic response to exertion.  Not on  nodal blocking agents or Aricept. -Continue telemetry monitoring  Nausea and vomiting: Resolved.  Abdominal exam benign.  Tolerating p.o. intake.  LFT normal except for mild elevated bilirubin.  Lipase normal. S/p cholecystectomy in 2020. -Antiemetics as needed  Hyperbilirubinemia with indirect predominance: Has history of Gilbert's syndrome.  Stable.  Dementia without behavioral disturbance: Oriented x4 except date and year. -Reorientation and delirium precautions.  Unknown liver  cirrhosis: Likely alcoholic.  Daughter concerned about his wine consumption.  Followed by [facility]. -Encouraged alcohol cessation  Rheumatoid arthritis:  Skyrizi injection every 12 weeks. -Outpatient follow-up  Essential hypertension: BP slightly elevated -Hold home losartan in the setting of orthostasis.  BPH -Continue home Avodart  Increased nutrient needs Body mass index is 25.14 kg/m. -Consult dietitian -Started multivitamin   DVT prophylaxis:  Place TED hose Start: 12/20/21 1514 enoxaparin (LOVENOX) injection 40 mg Start: 12/20/21 1000  Code Status: Full code Family Communication: Updated patient's daughter at bedside. Level of care: Telemetry Status is: Observation The patient will require care spanning > 2 midnights and should be moved to inpatient because: Near syncope with severe TAVR stenosis   Final disposition: Likely home once medically cleared Consultants:  Cardiology  Sch Meds:  Scheduled Meds:  aspirin  81 mg Oral Daily   dutasteride  0.5 mg Oral Daily   enoxaparin (LOVENOX) injection  40 mg Subcutaneous Q24H   escitalopram  20 mg Oral QHS   multivitamin with minerals  1 tablet Oral Daily   pantoprazole  40 mg Oral Daily   sodium chloride flush  3 mL Intravenous Q12H   Continuous Infusions: PRN Meds:.acetaminophen **OR** acetaminophen, melatonin, ondansetron **OR** ondansetron (ZOFRAN) IV, mouth rinse, senna-docusate  Antimicrobials: Anti-infectives (From admission, onward)    None        I have personally reviewed the following labs and images: CBC: Recent Labs  Lab 12/19/21 2326 12/21/21 0543  WBC 7.9 6.7  HGB 14.5 14.5  HCT 43.8 44.1  MCV 89.4 89.6  PLT 141* 143*   BMP &GFR Recent Labs  Lab 12/19/21 2326 12/21/21 0543  NA 134* 138  K 3.8 4.1  CL 102 104  CO2 25 28  GLUCOSE 145* 107*  BUN 19 17  CREATININE 1.05 0.97  CALCIUM 8.6* 8.8*  MG  --  2.0  PHOS  --  3.7   Estimated Creatinine Clearance: 48 mL/min (by C-G  formula based on SCr of 0.97 mg/dL). Liver & Pancreas: Recent Labs  Lab 12/20/21 0010 12/21/21 0543  AST 29 28  ALT 36 33  ALKPHOS 55 52  BILITOT 1.8* 1.8*  PROT 7.0 6.5  ALBUMIN 4.0 3.6   Recent Labs  Lab 12/20/21 0010  LIPASE 38   No results for input(s): "AMMONIA" in the last 168 hours. Diabetic: No results for input(s): "HGBA1C" in the last 72 hours. Recent Labs  Lab 12/20/21 0013 12/20/21 1133 12/21/21 0508  GLUCAP 155* 128* 108*   Cardiac Enzymes: No results for input(s): "CKTOTAL", "CKMB", "CKMBINDEX", "TROPONINI" in the last 168 hours. No results for input(s): "PROBNP" in the last 8760 hours. Coagulation Profile: No results for input(s): "INR", "PROTIME" in the last 168 hours. Thyroid Function Tests: Recent Labs    12/21/21 0543  TSH 5.610*  FREET4 1.03   Lipid Profile: No results for input(s): "CHOL", "HDL", "LDLCALC", "TRIG", "CHOLHDL", "LDLDIRECT" in the last 72 hours. Anemia Panel: No results for input(s): "VITAMINB12", "FOLATE", "FERRITIN", "TIBC", "IRON", "RETICCTPCT" in the last 72 hours. Urine analysis:    Component Value Date/Time  COLORURINE YELLOW 12/20/2021 0408   APPEARANCEUR CLEAR 12/20/2021 0408   LABSPEC 1.019 12/20/2021 0408   PHURINE 5.0 12/20/2021 0408   GLUCOSEU NEGATIVE 12/20/2021 0408   HGBUR NEGATIVE 12/20/2021 0408   BILIRUBINUR NEGATIVE 12/20/2021 0408   KETONESUR NEGATIVE 12/20/2021 0408   PROTEINUR NEGATIVE 12/20/2021 0408   UROBILINOGEN 1.0 04/14/2014 0116   NITRITE NEGATIVE 12/20/2021 0408   LEUKOCYTESUR NEGATIVE 12/20/2021 0408   Sepsis Labs: Invalid input(s): "PROCALCITONIN", "LACTICIDVEN"  Microbiology: Recent Results (from the past 240 hour(s))  Resp Panel by RT-PCR (Flu A&B, Covid) Anterior Nasal Swab     Status: None   Collection Time: 12/20/21 12:10 AM   Specimen: Anterior Nasal Swab  Result Value Ref Range Status   SARS Coronavirus 2 by RT PCR NEGATIVE NEGATIVE Final    Comment: (NOTE) SARS-CoV-2  target nucleic acids are NOT DETECTED.  The SARS-CoV-2 RNA is generally detectable in upper respiratory specimens during the acute phase of infection. The lowest concentration of SARS-CoV-2 viral copies this assay can detect is 138 copies/mL. A negative result does not preclude SARS-Cov-2 infection and should not be used as the sole basis for treatment or other patient management decisions. A negative result may occur with  improper specimen collection/handling, submission of specimen other than nasopharyngeal swab, presence of viral mutation(s) within the areas targeted by this assay, and inadequate number of viral copies(<138 copies/mL). A negative result must be combined with clinical observations, patient history, and epidemiological information. The expected result is Negative.  Fact Sheet for Patients:  EntrepreneurPulse.com.au  Fact Sheet for Healthcare Providers:  IncredibleEmployment.be  This test is no t yet approved or cleared by the [place] FDA and  has been authorized for detection and/or diagnosis of SARS-CoV-2 by FDA under an Emergency Use Authorization (EUA). This EUA will remain  in effect (meaning this test can be used) for the duration of the COVID-19 declaration under Section 564(b)(1) of the Act, 21 U.S.C.section 360bbb-3(b)(1), unless the authorization is terminated  or revoked sooner.       Influenza A by PCR NEGATIVE NEGATIVE Final   Influenza B by PCR NEGATIVE NEGATIVE Final    Comment: (NOTE) The Xpert Xpress SARS-CoV-2/FLU/RSV plus assay is intended as an aid in the diagnosis of influenza from Nasopharyngeal swab specimens and should not be used as a sole basis for treatment. Nasal washings and aspirates are unacceptable for Xpert Xpress SARS-CoV-2/FLU/RSV testing.  Fact Sheet for Patients: EntrepreneurPulse.com.au  Fact Sheet for Healthcare  Providers: IncredibleEmployment.be  This test is not yet approved or cleared by the [place] FDA and has been authorized for detection and/or diagnosis of SARS-CoV-2 by FDA under an Emergency Use Authorization (EUA). This EUA will remain in effect (meaning this test can be used) for the duration of the COVID-19 declaration under Section 564(b)(1) of the Act, 21 U.S.C. section 360bbb-3(b)(1), unless the authorization is terminated or revoked.  Performed at [facility], [place] [address], [place], [place] [address]     Radiology Studies: VAS US CAROTID  Result Date: 12/21/2021 Carotid Arterial Duplex Study Patient Name:  [name]  Date of Exam:   12/21/2021 Medical Rec #: [id number]      Accession #:    [id number] Date of Birth: Jul 20, 1927       Patient Gender: M Patient Age:   57 years Exam Location:  [facility] Procedure:      VAS US CAROTID Referring Phys: Bretta Bang Breeann Reposa --------------------------------------------------------------------------------  Indications:       Syncope.  Risk Factors:      Hypertension, hyperlipidemia. Comparison Study:  02/05/2018 Carotid artery duplex- right- no evidence of                    hemodynamically significant stenosis, left 1-39% ICA                    stenosis. Performing Technologist: Maudry Mayhew MHA, RDMS, RVT, RDCS  Examination Guidelines: A complete evaluation includes B-mode imaging, spectral Doppler, color Doppler, and power Doppler as needed of all accessible portions of each vessel. Bilateral testing is considered an integral part of a complete examination. Limited examinations for reoccurring indications may be performed as noted.  Right Carotid Findings: +----------+--------+--------+--------+------------------+------------------+           PSV cm/sEDV cm/sStenosisPlaque DescriptionComments           +----------+--------+--------+--------+------------------+------------------+ CCA Prox   102     12                                                   +----------+--------+--------+--------+------------------+------------------+ CCA Distal74      8                                 intimal thickening +----------+--------+--------+--------+------------------+------------------+ ICA Prox  96      14                                                   +----------+--------+--------+--------+------------------+------------------+ ICA Distal104     17                                                   +----------+--------+--------+--------+------------------+------------------+ ECA       79                                                           +----------+--------+--------+--------+------------------+------------------+ +----------+--------+-------+----------------+-------------------+           PSV cm/sEDV cmsDescribe        Arm Pressure (mmHG) +----------+--------+-------+----------------+-------------------+ Subclavian170            Multiphasic, WNL                    +----------+--------+-------+----------------+-------------------+ +---------+--------+--+--------+--+---------+ VertebralPSV cm/s60EDV cm/s11Antegrade +---------+--------+--+--------+--+---------+  Left Carotid Findings: +----------+--------+--------+--------+-----------------------+--------+           PSV cm/sEDV cm/sStenosisPlaque Description     Comments +----------+--------+--------+--------+-----------------------+--------+ CCA Prox  140     13                                              +----------+--------+--------+--------+-----------------------+--------+ CCA Distal138     15  smooth and homogeneous          +----------+--------+--------+--------+-----------------------+--------+ ICA Prox  89      14              smooth and heterogenous         +----------+--------+--------+--------+-----------------------+--------+ ICA Distal100     16                                               +----------+--------+--------+--------+-----------------------+--------+ ECA       92                                                      +----------+--------+--------+--------+-----------------------+--------+ +----------+--------+--------+----------------+-------------------+           PSV cm/sEDV cm/sDescribe        Arm Pressure (mmHG) +----------+--------+--------+----------------+-------------------+ [id number]             Multiphasic, WNL                    +----------+--------+--------+----------------+-------------------+ +---------+--------+--+--------+-+---------+ VertebralPSV cm/s60EDV cm/s9Antegrade +---------+--------+--+--------+-+---------+   Summary: Right Carotid: The extracranial vessels were near-normal with only minimal wall                thickening or plaque. Left Carotid: Velocities in the left ICA are consistent with a 1-39% stenosis. Vertebrals:  Bilateral vertebral arteries demonstrate antegrade flow. Subclavians: Normal flow hemodynamics were seen in bilateral subclavian              arteries. No significant change when compared to prior study 02/05/2018. *See table(s) above for measurements and observations.  Electronically signed by Servando Snare MD on 12/21/2021 at 2:54:41 PM.    Final       Nichol Ator T. [place]  If 7PM-7AM, please contact night-coverage [email or web address] 12/21/2021, 5:52 PM

## 2021-12-21 NOTE — Consult Note (Addendum)
Cardiology Consultation:   Patient ID: [name] MRN: [id number]; DOB: 1927/07/07  Admit date: 12/19/2021 Date of Consult: 12/21/2021  PCP:  Lajean Manes, MD   [facility] Providers Cardiologist:  Candee Furbish, MD  Structural Heart:  Lauree Chandler, MD {  Patient Profile:   [name] is a 88 y.o. male with a history of mild non-obstructive CAD on cardiac catheterization in 01/2018, severe aortic stenosis s/p TAVR in 03/2018, hypertension, hyperlipidemia, GERD, Gilbert's syndrome, rheumatoid arthritis, and mild dementia who is being seen for the evaluation of near syncope at the request of Dr. Cyndia Skeeters.  History of Present Illness:   [name] is a 88 year old male with the above history who is followed by Dr. Marlou Porch. He is mainly followed for severe aortic stenosis.  He underwent TAVR in 03/2018.  Cardiac catheterization prior to that showed only mild CAD.  1 month post echo showed LVEF of 60% with normally functioning TAVR and mean gradient of 14 mm but did note new moderate perivalvular regurgitation.  Dr. Angelena Form and Dr. Roxy Manns reviewed and felt there was nothing to do in the absence of significant heart failure symptoms or symptomatic anemia.  Patient was last seen by Angelena Form, PA-C, in 05/03/2019 at which time he was doing well from a cardiac standpoint.  He has not been seen by cardiology since that time.  Last echo in 05/2021 showed LVEF of 70-75% with mild LVH and grade 1 diastolic dysfunction.  TAVR valve appeared to be functioning well with only mild AI and no AAS.  Mean gradient was higher at 18 mmHg.  Patient presented to the [facility] ED on 12/19/2021 for further evaluation of weakness and near syncope.  Patient is an active 88 year old male who lives by himself but his daughter lives close by and sees him daily.  Patient speaks [place] but daughter is at bedside and assists with history.  She states that over the last few months patient has been having  intermittent dizziness. Dizziness seem to be related to exertion and vomiting seems to associated with the dizziness. He has not complained of any palpitations and has not had any recent syncope (daughter dose report a syncopal episode a couple of years ago). He denies any chest pain or shortness of breath. No orthopnea, PND, or lower extremity edema. He likes to walk around his neighborhood and will go for walks but then gets too fatigued and cannot walk back.  His neighbors often have to drive him back.  This happened on Saturday (7/8) and since then he has been having more dizziness and vomiting.  He then vomited on Sunday (7/9) and then following this daughter states he declined quickly with near syncope and then lightheaded/dizzy/ unsteadiness and just feeling poorly. No fevers or abdominal pain.  Upon arrival to the ED, patient hypertensive but vitals stable. EKG showed sinus bradycardia, rate 53 bpm, with no acute ST/T changes. High-sensitivity troponin negative. Chest x-ray showed no acute findings. WBC 7.9, Hgb 14.5, Plts 141. Na 134, K 3.8, Glucose 145, BUN 19, Cr 1.05. Lipase normal. Total Bili 1.8 but other LFTs normal. Head CT showed no acute findings. He was given 500cc of normal saline in the ED and admitted for further evaluation. Echo was ordered and showed LVEF of 70-75% with no regional wall motion abnormalities and mild LVH as well as marked increase in gradients of TAVR valve to 45 mmHg. AT is low and he has increased LVOT suggesting high flow state contributing to elevated AV gradients.  Prosthetic valve was not well visualized. Cardiology consulted for further evaluation.   Past Medical History:  Diagnosis Date   BPH (benign prostatic hypertrophy)    Depression    Diverticulosis of colon    GERD (gastroesophageal reflux disease)    Gilbert's syndrome    History of kidney stones    HLD (hyperlipidemia)    HOH (hard of hearing)    refuses to wear his Hearing Aid   Hypertension     Inguinal hernia    right   Internal hemorrhoid    Normal coronary arteries    a. Cath 07/2010: normal coronaries. Felt to have noncardiac CP/SOB at that time possibly r/t anxiety.   RA (rheumatoid arthritis) (HCC)    bilateral hands/ wrist--  seronegative   S/P TAVR (transcatheter aortic valve replacement) 03/27/2018   23 mm Edwards Sapien 3 transcatheter heart valve placed via percutaneous right transfemoral approach    Severe aortic stenosis    Thyroid nodule    noted 11-2009   Wears dentures     Past Surgical History:  Procedure Laterality Date   CARDIAC CATHETERIZATION  07-19-2010  dr Tressia Miners turner   normal coronary arteries,  ef 60%,  moderate aortic stenosis- gradient 51mHg, normal right heart pressure   CARDIOVASCULAR STRESS TEST  05/ 2011   dr sMarlou Porch  normal low risk perfusion study   CATARACT EXTRACTION W/ INTRAOCULAR LENS  IMPLANT, BILATERAL  2013   CATARACT EXTRACTION, BILATERAL     CHOLECYSTECTOMY N/A 10/11/2018   Procedure: LAPAROSCOPIC CHOLECYSTECTOMY WITH INTRAOPERATIVE CHOLANGIOGRAM;  Surgeon: TJovita Kussmaul MD;  [facility];  Service: General;  Laterality: N/A;   COLONOSCOPY  2010  approx   ERCP N/A 09/25/2018   Procedure: ENDOSCOPIC RETROGRADE CHOLANGIOPANCREATOGRAPHY (ERCP);  Surgeon: MClarene Essex MD;  [facility]  Service: Endoscopy;  Laterality: N/A;   INGUINAL HERNIA REPAIR Right 08/23/2018   Procedure: OPEN RIGHT INGUINAL HERNIA REPAIR WITH MESH;  Surgeon: GArmandina Gemma MD;  [facility];  Service: General;  Laterality: Right;   INTRAOPERATIVE TRANSTHORACIC ECHOCARDIOGRAM N/A 03/27/2018   Procedure: INTRAOPERATIVE TRANSTHORACIC ECHOCARDIOGRAM;  Surgeon: MBurnell Blanks MD;  [place] [facility]  Service: Open Heart Surgery;  Laterality: N/A;   REMOVAL OF STONES  09/25/2018   Procedure: REMOVAL OF STONES;  Surgeon: MClarene Essex MD;  [place] [facility]  Service: Endoscopy;;   RIGHT/LEFT HEART CATH AND CORONARY ANGIOGRAPHY N/A 02/06/2018    Procedure: RIGHT/LEFT HEART CATH AND CORONARY ANGIOGRAPHY;  Surgeon: MBurnell Blanks MD;  [facility];  Service: Cardiovascular;  Laterality: N/A;   SPHINCTEROTOMY  09/25/2018   Procedure: SPHINCTEROTOMY;  Surgeon: MClarene Essex MD;  [place]  Service: Endoscopy;;   THYROID LOBECTOMY Right 05/05/2015   Procedure: RIGHT THYROID LOBECTOMY;  Surgeon: TArmandina Gemma MD;  [place] [facility]  Service: General;  Laterality: Right;   TRANSANAL HEMORRHOIDAL DEARTERIALIZATION N/A 04/09/2014   Procedure: TRANSANAL HEMORRHOIDAL DEARTERIALIZATION OF INTERNAL HEMORROIDS;  Surgeon: ALeighton Ruff MD;  [facility]  Service: General;  Laterality: N/A;   TRANSCATHETER AORTIC VALVE REPLACEMENT, TRANSFEMORAL  03/27/2018   TRANSCATHETER AORTIC VALVE REPLACEMENT, TRANSFEMORAL N/A 03/27/2018   Procedure: TRANSCATHETER AORTIC VALVE REPLACEMENT, TRANSFEMORAL. Edwards SAPIEN 3 Transcatheter Heart Valve 245m;  Surgeon: McBurnell BlanksMD;  [facility] Service: Open [facility] Surgery;  Laterality: N/A;   TRANSTHORACIC ECHOCARDIOGRAM  11-26-2013   moderate focal basal and mild LVH/  ef 6516-94%/grade I diastolic dysfunction/ mild LAE/  moderate  calcification with stenosis AV with mild regurg,  gradients 35 abd 58 mmHg /  mild TR     Home Medications:  Prior to Admission medications   Medication Sig Start Date End Date Taking? Authorizing Provider  aspirin 81 MG tablet Take 1 tablet (81 mg total) by mouth daily. Patient taking differently: Take 81 mg by mouth daily. 06/05/18  Yes Barrett, Evelene Croon, PA-C  dutasteride (AVODART) 0.5 MG capsule Take 0.5 mg by mouth daily.   Yes [provider]  escitalopram (LEXAPRO) 20 MG tablet Take 1 tablet (20 mg total) by mouth daily. Patient taking differently: Take 20 mg by mouth at bedtime. 09/06/16  Yes Angiulli, Lavon Paganini, PA-C  losartan (COZAAR) 25 MG tablet Take 25 mg by mouth daily.  01/21/18  Yes  [provider]  Melatonin 5 MG TABS Take 10-15 mg by mouth at bedtime as needed (for sleep).   Yes [provider]  pantoprazole (PROTONIX) 40 MG tablet Take 1 tablet (40 mg total) by mouth daily. 09/27/18  Yes Vann, Jessica U, DO  Risankizumab-rzaa (SKYRIZI PEN) 150 MG/ML SOAJ Inject 150 mg into the skin as directed. Every 12 weeks for maintenance. 03/09/21  Yes Lavonna Monarch, MD  clobetasol cream (TEMOVATE) 8.29 % Apply 1 application. topically 2 (two) times daily. Patient not taking: Reported on 12/20/2021 09/29/21   Lavonna Monarch, MD    Inpatient Medications: Scheduled Meds:  aspirin  81 mg Oral Daily   dutasteride  0.5 mg Oral Daily   enoxaparin (LOVENOX) injection  40 mg Subcutaneous Q24H   escitalopram  20 mg Oral QHS   multivitamin with minerals  1 tablet Oral Daily   pantoprazole  40 mg Oral Daily   sodium chloride flush  3 mL Intravenous Q12H   Continuous Infusions:  PRN Meds: acetaminophen **OR** acetaminophen, melatonin, ondansetron **OR** ondansetron (ZOFRAN) IV, mouth rinse, senna-docusate  Allergies:    Allergies  Allergen Reactions   Ativan [Lorazepam] Other (See Comments)    hallucinations   Phenergan [Promethazine Hcl] Hypertension    hallucinations   Eggs Or Egg-Derived Products Rash    Small rash after eating for several days in a row    Social History:   Social History   Socioeconomic History   Marital status: Widowed    Spouse name: Not on file   Number of children: Not on file   Years of education: Not on file   Highest education level: Not on file  Occupational History   Not on file  Tobacco Use   Smoking status: Former    Years: 40.00    Types: Cigarettes    Start date: 06/13/1973    Quit date: 04/03/1981    Years since quitting: 40.7   Smokeless tobacco: Never  Vaping Use   Vaping Use: Never used  Substance and Sexual Activity   Alcohol use: Yes    Comment: occasional   Drug use: No   Sexual activity: Not on file   Other Topics Concern   Not on file  Social History Narrative   Not on file   Social Determinants of Health   Financial Resource Strain: Not on file  Food Insecurity: Not on file  Transportation Needs: Not on file  Physical Activity: Not on file  Stress: Not on file  Social Connections: Not on file  Intimate Partner Violence: Not on file    Family History:   Family History  Problem Relation Age of Onset   Pulmonary embolism Mother 58  caused by complications of surgery   CVA Father    Diabetes Brother    Diabetes Brother    Breast cancer Sister      ROS:  Please see the history of present illness.  All other ROS reviewed and negative.     Physical Exam/Data:   Vitals:   12/20/21 1500 12/20/21 2014 12/20/21 2227 12/21/21 0511  BP: (!) 159/68 (!) 158/65 (!) 157/69 (!) 158/62  Pulse: 63 (!) 56 (!) 57 (!) 50  Resp:  '18 20 17  '$ Temp:  98.3 F (36.8 C) 98.1 F (36.7 C) 97.7 F (36.5 C)  TempSrc:    Oral  SpO2: 97% 95% 96% 97%  Weight:      Height:        Intake/Output Summary (Last 24 hours) at 12/21/2021 0941 Last data filed at 12/20/2021 1700 Gross per 24 hour  Intake 534.14 ml  Output 1 ml  Net 533.14 ml      12/19/2021   11:21 PM 10/04/2019    9:28 PM 10/11/2018   10:32 AM  Last 3 Weights  Weight (lbs) 165 lb 5.5 oz 165 lb 5.5 oz 160 lb 7.9 oz  Weight (kg) 75 kg 75 kg 72.8 kg     Body mass index is 25.14 kg/m.  General: 88 y.o. male resting comfortably in no acute distress. HEENT: Normocephalic and atraumatic. Sclera clear.  Neck: Supple. No JVD. Heart: Bradycardic with normal rhythm. III/VI systolic murmur. Radial pulses 2+ and equal bilaterally. Lungs: No increased work of breathing. Clear to ausculation bilaterally. No wheezes, rhonchi, or rales.  Abdomen: Soft, non-distended, and non-tender to palpation. Bowel sounds present. Extremities: No lower extremity edema.    Skin: Warm and dry. Neuro: Alert and oriented x3. No focal deficits. Psych:  Normal affect. Responds appropriately.  EKG:  The EKG was personally reviewed and demonstrates:  Sinus bradycardia, rate 53 bpm, with no acute ST/T changes. Telemetry:  Telemetry was personally reviewed and demonstrates:  Sinus rhythm with rates in the 50s to 60s.  Relevant CV Studies:  Echocardiogram 12/20/2021: Impressions: 1. Left ventricular ejection fraction, by estimation, is 70 to 75%. The  left ventricle has hyperdynamic function. The left ventricle has no  regional wall motion abnormalities. There is mild left ventricular  hypertrophy. Left ventricular diastolic  parameters are indeterminate.   2. Right ventricular systolic function is normal. The right ventricular  size is normal. There is normal pulmonary artery systolic pressure. The  estimated right ventricular systolic pressure is 85.0 mmHg.   3. The mitral valve is normal in structure. No evidence of mitral valve  regurgitation. No evidence of mitral stenosis.   4. The inferior vena cava is normal in size with greater than 50%  respiratory variability, suggesting right atrial pressure of 3 mmHg.   5. There is a 23 mm Edwards Sapien prosthetic (TAVR) valve present in the  aortic position. Mild perivalvular leak. Vmax 4.5 m/s, MG 45 mmHg, EOA 1.5  cm^2, DI 0.38, AT 48m. Marked increase in gradients through AV compared  to prior echo (157mg on  05/18/21). AT is low, and has increased LVOT gradient, suggesting high flow  state contributing to elevated AV gradients, though not enough to explain  his MG 4549m. Prosthetic valve is not well visualized, would recommend  TEE to better assess etiology of  increased gradients   Laboratory Data:  High Sensitivity Troponin:   Recent Labs  Lab 12/20/21 0010 12/20/21 0143  TROPONINIHS 8 9  Chemistry Recent Labs  Lab 12/19/21 2326 12/21/21 0543  NA 134* 138  K 3.8 4.1  CL 102 104  CO2 25 28  GLUCOSE 145* 107*  BUN 19 17  CREATININE 1.05 0.97  CALCIUM 8.6* 8.8*   MG  --  2.0  GFRNONAA >60 >60  ANIONGAP 7 6    Recent Labs  Lab 12/20/21 0010 12/21/21 0543  PROT 7.0 6.5  ALBUMIN 4.0 3.6  AST 29 28  ALT 36 33  ALKPHOS 55 52  BILITOT 1.8* 1.8*   Lipids No results for input(s): "CHOL", "TRIG", "HDL", "LABVLDL", "LDLCALC", "CHOLHDL" in the last 168 hours.  Hematology Recent Labs  Lab 12/19/21 2326 12/21/21 0543  WBC 7.9 6.7  RBC 4.90 4.92  HGB 14.5 14.5  HCT 43.8 44.1  MCV 89.4 89.6  MCH 29.6 29.5  MCHC 33.1 32.9  RDW 12.2 12.3  PLT 141* 143*   Thyroid  Recent Labs  Lab 12/21/21 0543  TSH 5.610*    BNPNo results for input(s): "BNP", "PROBNP" in the last 168 hours.  DDimer No results for input(s): "DDIMER" in the last 168 hours.   Radiology/Studies:  ECHOCARDIOGRAM COMPLETE  Result Date: 12/20/2021    ECHOCARDIOGRAM REPORT   Patient Name:   [name] Date of Exam: 12/20/2021 Medical Rec #:  [id number]     Height:       68.0 in Accession #:    [id number]    Weight:       165.3 lb Date of Birth:  1927/11/06      BSA:          1.885 m Patient Age:    55 years      BP:           157/60 mmHg Patient Gender: M             HR:           53 bpm. Exam Location:  Inpatient Procedure: 2D Echo, Cardiac Doppler, Color Doppler and Strain Analysis Indications:     R55 Syncope  History:         Patient has prior history of Echocardiogram examinations, most                  recent 05/18/2021. CHF, Abnormal ECG, Aortic Valve Disease,                  Arrythmias:Bradycardia, Signs/Symptoms:Altered Mental Status,                  Alzheimer's, Syncope and Chest Pain; Risk Factors:Hypertension,                  Dyslipidemia and Former Smoker. Severe aortic stenosis.                  Aortic Valve: 23 mm Edwards Sapien prosthetic, stented (TAVR)                  valve is present in the aortic position.  Sonographer:     Roseanna Rainbow RDCS Referring Phys:  [id number] Ilene Qua OPYD Diagnosing Phys: Oswaldo Milian MD  Sonographer Comments: Global longitudinal  strain was attempted. IMPRESSIONS  1. Left ventricular ejection fraction, by estimation, is 70 to 75%. The left ventricle has hyperdynamic function. The left ventricle has no regional wall motion abnormalities. There is mild left ventricular hypertrophy. Left ventricular diastolic parameters are indeterminate.  2. Right ventricular systolic function is normal. The right ventricular size is normal. There  is normal pulmonary artery systolic pressure. The estimated right ventricular systolic pressure is 12.8 mmHg.  3. The mitral valve is normal in structure. No evidence of mitral valve regurgitation. No evidence of mitral stenosis.  4. The inferior vena cava is normal in size with greater than 50% respiratory variability, suggesting right atrial pressure of 3 mmHg.  5. There is a 23 mm Edwards Sapien prosthetic (TAVR) valve present in the aortic position. Mild perivalvular leak. Vmax 4.5 m/s, MG 45 mmHg, EOA 1.5 cm^2, DI 0.38, AT 67m. Marked increase in gradients through AV compared to prior echo (127mg on 05/18/21). AT is low, and has increased LVOT gradient, suggesting high flow state contributing to elevated AV gradients, though not enough to explain his MG 4526m. Prosthetic valve is not well visualized, would recommend TEE to better assess etiology of increased gradients FINDINGS  Left Ventricle: Left ventricular ejection fraction, by estimation, is 70 to 75%. The left ventricle has hyperdynamic function. The left ventricle has no regional wall motion abnormalities. The left ventricular internal cavity size was normal in size. There is mild left ventricular hypertrophy. Left ventricular diastolic parameters are indeterminate. Right Ventricle: The right ventricular size is normal. No increase in right ventricular wall thickness. Right ventricular systolic function is normal. There is normal pulmonary artery systolic pressure. The tricuspid regurgitant velocity is 2.33 m/s, and  with an assumed right atrial  pressure of 3 mmHg, the estimated right ventricular systolic pressure is 24.78.6Hg. Left Atrium: Left atrial size was normal in size. Right Atrium: Right atrial size was normal in size. Pericardium: Trivial pericardial effusion is present. Mitral Valve: The mitral valve is normal in structure. No evidence of mitral valve regurgitation. No evidence of mitral valve stenosis. MV peak gradient, 8.8 mmHg. The mean mitral valve gradient is 2.0 mmHg. Tricuspid Valve: The tricuspid valve is normal in structure. Tricuspid valve regurgitation is trivial. Aortic Valve: The aortic valve has been repaired/replaced. Aortic valve regurgitation is mild. Aortic regurgitation PHT measures 769 msec. Aortic valve mean gradient measures 39.1 mmHg. Aortic valve peak gradient measures 72.0 mmHg. Aortic valve area, by  VTI measures 1.66 cm. There is a 23 mm Edwards Sapien prosthetic, stented (TAVR) valve present in the aortic position. Pulmonic Valve: The pulmonic valve was not well visualized. Pulmonic valve regurgitation is not visualized. Aorta: The aortic root and ascending aorta are structurally normal, with no evidence of dilitation. Venous: The inferior vena cava is normal in size with greater than 50% respiratory variability, suggesting right atrial pressure of 3 mmHg. IAS/Shunts: The interatrial septum was not well visualized.  LEFT VENTRICLE PLAX 2D LVIDd:         4.20 cm     Diastology LVIDs:         2.60 cm     LV e' medial:    5.33 cm/s LV PW:         1.00 cm     LV E/e' medial:  15.4 LV IVS:        1.10 cm     LV e' lateral:   5.98 cm/s LVOT diam:     2.30 cm     LV E/e' lateral: 13.7 LV SV:         161 LV SV Index:   85 LVOT Area:     4.15 cm  LV Volumes (MOD) LV vol d, MOD A4C: 77.1 ml LV vol s, MOD A4C: 19.6 ml LV SV MOD A4C:     77.1 ml RIGHT VENTRICLE  IVC RV S prime:     15.70 cm/s  IVC diam: 1.70 cm TAPSE (M-mode): 2.1 cm LEFT ATRIUM             Index        RIGHT ATRIUM           Index LA diam:         2.80 cm 1.49 cm/m   RA Area:     10.40 cm LA Vol (A2C):   35.1 ml 18.62 ml/m  RA Volume:   18.70 ml  9.92 ml/m LA Vol (A4C):   28.6 ml 15.17 ml/m LA Biplane Vol: 32.0 ml 16.98 ml/m  AORTIC VALVE AV Area (Vmax):    1.65 cm AV Area (Vmean):   1.61 cm AV Area (VTI):     1.66 cm AV Vmax:           424.12 cm/s AV Vmean:          288.487 cm/s AV VTI:            0.968 m AV Peak Grad:      72.0 mmHg AV Mean Grad:      39.1 mmHg LVOT Vmax:         168.00 cm/s LVOT Vmean:        112.000 cm/s LVOT VTI:          0.387 m LVOT/AV VTI ratio: 0.40 AI PHT:            769 msec  AORTA Ao Root diam: 2.70 cm Ao Asc diam:  3.60 cm MITRAL VALVE                TRICUSPID VALVE MV Area (PHT): 2.16 cm     TR Peak grad:   21.7 mmHg MV Area VTI:   3.01 cm     TR Vmax:        233.00 cm/s MV Peak grad:  8.8 mmHg MV Mean grad:  2.0 mmHg     SHUNTS MV Vmax:       1.48 m/s     Systemic VTI:  0.39 m MV Vmean:      63.6 cm/s    Systemic Diam: 2.30 cm MV Decel Time: 352 msec MV E velocity: 82.00 cm/s MV A velocity: 144.00 cm/s MV E/A ratio:  0.57 Oswaldo Milian MD Electronically signed by Oswaldo Milian MD Signature Date/Time: 12/20/2021/3:58:26 PM    Final (Updated)    CT Head Wo Contrast  Result Date: 12/20/2021 CLINICAL DATA:  Weakness near syncopal episode EXAM: CT HEAD WITHOUT CONTRAST TECHNIQUE: Contiguous axial images were obtained from the base of the skull through the vertex without intravenous contrast. RADIATION DOSE REDUCTION: This exam was performed according to the departmental dose-optimization program which includes automated exposure control, adjustment of the mA and/or kV according to patient size and/or use of iterative reconstruction technique. COMPARISON:  CT brain 10/04/2019 FINDINGS: Brain: No acute territorial infarction, hemorrhage or intracranial mass. Atrophy and chronic small vessel ischemic changes of the white matter. Chronic appearing lacunar infarcts within the bilateral basal ganglia. Stable  ventricle size. Vascular: No hyperdense vessels.  Carotid vascular calcification Skull: Normal. Negative for fracture or focal lesion. Sinuses/Orbits: No acute finding. Other: None IMPRESSION: 1. No CT evidence for acute intracranial abnormality. 2. Atrophy and chronic small vessel ischemic changes of the white matter Electronically Signed   By: Donavan Foil M.D.   On: 12/20/2021 00:47   DG Chest 2 View  Result Date: 12/20/2021 CLINICAL DATA:  Weakness EXAM: CHEST - 2 VIEW COMPARISON:  09/14/2021 FINDINGS: Cardiac shadow is within normal limits. Lungs are well aerated bilaterally. No focal infiltrate or effusion is seen. Degenerative changes of the thoracic spine are noted. Changes of prior TAVR are seen. IMPRESSION: No active cardiopulmonary disease. Electronically Signed   By: Inez Catalina M.D.   On: 12/20/2021 00:43     Assessment and Plan:   Dizziness Near Syncope History of Severe Aortic Stenosis s/p TAVR in 2019 with Increased Gradients Patient presented after near syncope episodes that was preceded by vomiting. Also reports dizziness on exertion over the last few months with associated vomiting. No palpitations or overt syncope. Mild bradycardic at baseline but BP stable and on the higher side. Orthostatic borderline with systolic BP dropping from 165 sitting to 147 standing. Received IV fluids in the ED. Echo this admission showed normal LV function with marked increase in gradients of TAVR valve to 45 mmHg. AT is low and he has increased LVOT suggesting high flow state contributing to elevated AV gradients. Prosthetic valve was not well visualized. - Patient still having some dizziness with minimal exertion such as straining to sit up in bed. Otherwise, feeling well. - No concerning arrhythmias noted on telemetry. - Discussed TEE with patient and he is agreeable to this. Used a telephone interpreter (ID: [id number]) and officially consent patient for procedure. Daughter was also present for  this.   Shared Decision Making/Informed Consent{ The risks [esophageal damage, perforation (1:10,000 risk), bleeding, pharyngeal hematoma as well as other potential complications associated with conscious sedation including aspiration, arrhythmia, respiratory failure and death], benefits (treatment guidance and diagnostic support) and alternatives of a transesophageal echocardiogram were discussed in detail with [name] and he is willing to proceed.   Mild Non-Obstructive CAD Noted on cardiac catheterization prior to TAVR in 2019.  - No angina. - Continue aspirin. Does not appear to be on a statin. Don't think there is much benefit in adding one at this time given his advanced age.  Hypertension BP elevated. Systolic BP often in the 803O to 160s. - Home Losartan has been held due to borderline orthostasis. - Repeat orthostatic. If normal, would consider restarting Losartan.  Otherwise, per primary team: - Nausea/vomiting - Hyperbilirubinemia - Gilbert's syndrome - Unknown liver cirrhosis (likely alcoholic) - Rheumatoid arthritis - Dementia - BPH     Risk Assessment/Risk Scores:   For questions or updates, please contact [facility] Please consult [email or web address] for contact info under    Signed, Darreld Mclean, PA-C  12/21/2021 9:41 AM

## 2021-12-22 DIAGNOSIS — Z7189 Other specified counseling: Secondary | ICD-10-CM

## 2021-12-22 DIAGNOSIS — I10 Essential (primary) hypertension: Secondary | ICD-10-CM | POA: Diagnosis not present

## 2021-12-22 DIAGNOSIS — I350 Nonrheumatic aortic (valve) stenosis: Secondary | ICD-10-CM | POA: Diagnosis not present

## 2021-12-22 DIAGNOSIS — N400 Enlarged prostate without lower urinary tract symptoms: Secondary | ICD-10-CM

## 2021-12-22 DIAGNOSIS — Z515 Encounter for palliative care: Secondary | ICD-10-CM | POA: Diagnosis not present

## 2021-12-22 DIAGNOSIS — Z952 Presence of prosthetic heart valve: Secondary | ICD-10-CM | POA: Diagnosis not present

## 2021-12-22 DIAGNOSIS — F0390 Unspecified dementia without behavioral disturbance: Secondary | ICD-10-CM | POA: Diagnosis not present

## 2021-12-22 DIAGNOSIS — R55 Syncope and collapse: Secondary | ICD-10-CM | POA: Diagnosis not present

## 2021-12-22 DIAGNOSIS — R531 Weakness: Secondary | ICD-10-CM

## 2021-12-22 MED ORDER — ENSURE ENLIVE PO LIQD
237.0000 mL | Freq: Two times a day (BID) | ORAL | Status: DC
  Administered 2021-12-22 – 2021-12-26 (×7): 237 mL via ORAL

## 2021-12-22 NOTE — Progress Notes (Signed)
PROGRESS NOTE    [name]  SFK:[id number] DOB: July 23, 1927 DOA: 12/19/2021 PCP: Lajean Manes, MD   Brief Narrative:  88 year old M with PMH of dementia, nonobstructive CAD, severe AS s/p TAVR, liver cirrhosis, RA, HTN, alcohol abuse, sinus bradycardia and BPH presented with lightheadedness, nausea, vomiting, diaphoresis and generalized weakness for 1 to 2 days and admitted for near syncope.  On presentation, he had sinus bradycardia to the 50s.  COVID-19 and influenza testing were negative.  CT head and chest x-ray was without acute finding.  TTE showed moderately increased gradient across TAVR.  Cardiology was consulted.  TEE scheduled for 12/24/2021.  Carotid ultrasound without significant finding.  Assessment & Plan:   Near syncope likely due to TAVR stenosis History of severe AS status post TAVR Sinus bradycardia -TTE with marked increase in gradient through AV compared to prior echocardiogram.  He also had sinus bradycardia but with chronotropic response to exertion.  He is not on nodal blocking agents or Aricept.  Carotid US without significant finding.  Orthostatic vitals negative but symptomatic from lying to sitting.  Slightly elevated TSH but normal free T4.  -TEE tentatively scheduled for 7/14. -TED hose -Fall precautions.  PT recommended home with PT. -Hold home losartan.  Nausea and vomiting -Resolved.  Abdominal exam benign.  Tolerating p.o. intake.  LFT normal except for mild elevated bilirubin.  Lipase normal. S/p cholecystectomy in 2020. -Antiemetics as needed  Hyperbilirubinemia with indirect predominance -Has history of Gilbert's syndrome.   -Stable.   Dementia without behavioral disturbance -Not oriented to date and year -Reorientation and delirium precautions.  Goals of care -Consult palliative care for goals of care discussion.  Overall prognosis is guarded to poor.   Unknown liver cirrhosis -Likely alcoholic.  Daughter concerned about his wine  consumption.  Followed by [facility]. -Encouraged alcohol cessation -Outpatient follow-up  Hyponatremia -Resolved  Thrombocytopenia Questionable cause.  No signs of bleeding.  Rheumatoid arthritis:  Skyrizi injection every 12 weeks. -Outpatient follow-up   Essential hypertension: BP slightly elevated -Hold home losartan in the setting of orthostasis.   BPH -Continue home Avodart   DVT prophylaxis: Lovenox Code Status: Full Family Communication: None at bedside Disposition Plan: Status is: Inpatient Remains inpatient appropriate because: Of severity of illness.  Need for TEE  Consultants: Cardiology  Procedures: 2D echo  Antimicrobials: None   Subjective: Patient seen and examined at bedside.  Poor historian.  Hard of hearing.  No overnight fever, seizures or chest pain, vomiting reported.  Objective: Vitals:   12/21/21 1248 12/21/21 1252 12/21/21 2019 12/22/21 0519  BP: (!) 115/48 (!) 121/55 (!) 152/60 (!) 156/70  Pulse: 67 70 64 (!) 51  Resp:   (!) 22   Temp:   97.7 F (36.5 C) (!) 97.5 F (36.4 C)  TempSrc:   Oral Oral  SpO2:   96% 96%  Weight:      Height:        Intake/Output Summary (Last 24 hours) at 12/22/2021 1040 Last data filed at 12/22/2021 0943 Gross per 24 hour  Intake 223 ml  Output 200 ml  Net 23 ml   Filed Weights   12/19/21 2321  Weight: 75 kg    Examination:  General exam: Appears calm and comfortable.  Currently on room air.  Elderly male lying in bed.  Hard of hearing. Respiratory system: Bilateral decreased breath sounds at bases with some scattered crackles, intermittent tachypnea Cardiovascular system: S1 & S2 heard, Rate controlled Gastrointestinal system: Abdomen is nondistended, soft and  nontender. Normal bowel sounds heard. Extremities: No cyanosis, clubbing; trace lower extremity edema Central nervous system: Awake, slow to respond, poor historian.  No focal neurological deficits. Moving extremities Skin: No rashes,  lesions or ulcers Psychiatry: Affect is mostly flat.  No signs of agitation.    Data Reviewed: I have personally reviewed following labs and imaging studies  CBC: Recent Labs  Lab 12/19/21 2326 12/21/21 0543  WBC 7.9 6.7  HGB 14.5 14.5  HCT 43.8 44.1  MCV 89.4 89.6  PLT 141* 789*   Basic Metabolic Panel: Recent Labs  Lab 12/19/21 2326 12/21/21 0543  NA 134* 138  K 3.8 4.1  CL 102 104  CO2 25 28  GLUCOSE 145* 107*  BUN 19 17  CREATININE 1.05 0.97  CALCIUM 8.6* 8.8*  MG  --  2.0  PHOS  --  3.7   GFR: Estimated Creatinine Clearance: 48 mL/min (by C-G formula based on SCr of 0.97 mg/dL). Liver Function Tests: Recent Labs  Lab 12/20/21 0010 12/21/21 0543  AST 29 28  ALT 36 33  ALKPHOS 55 52  BILITOT 1.8* 1.8*  PROT 7.0 6.5  ALBUMIN 4.0 3.6   Recent Labs  Lab 12/20/21 0010  LIPASE 38   No results for input(s): "AMMONIA" in the last 168 hours. Coagulation Profile: No results for input(s): "INR", "PROTIME" in the last 168 hours. Cardiac Enzymes: No results for input(s): "CKTOTAL", "CKMB", "CKMBINDEX", "TROPONINI" in the last 168 hours. BNP (last 3 results) No results for input(s): "PROBNP" in the last 8760 hours. HbA1C: No results for input(s): "HGBA1C" in the last 72 hours. CBG: Recent Labs  Lab 12/20/21 0013 12/20/21 1133 12/21/21 0508  GLUCAP 155* 128* 108*   Lipid Profile: No results for input(s): "CHOL", "HDL", "LDLCALC", "TRIG", "CHOLHDL", "LDLDIRECT" in the last 72 hours. Thyroid Function Tests: Recent Labs    12/21/21 0543  TSH 5.610*  FREET4 1.03   Anemia Panel: No results for input(s): "VITAMINB12", "FOLATE", "FERRITIN", "TIBC", "IRON", "RETICCTPCT" in the last 72 hours. Sepsis Labs: No results for input(s): "PROCALCITON", "LATICACIDVEN" in the last 168 hours.  Recent Results (from the past 240 hour(s))  Resp Panel by RT-PCR (Flu A&B, Covid) Anterior Nasal Swab     Status: None   Collection Time: 12/20/21 12:10 AM   Specimen:  Anterior Nasal Swab  Result Value Ref Range Status   SARS Coronavirus 2 by RT PCR NEGATIVE NEGATIVE Final    Comment: (NOTE) SARS-CoV-2 target nucleic acids are NOT DETECTED.  The SARS-CoV-2 RNA is generally detectable in upper respiratory specimens during the acute phase of infection. The lowest concentration of SARS-CoV-2 viral copies this assay can detect is 138 copies/mL. A negative result does not preclude SARS-Cov-2 infection and should not be used as the sole basis for treatment or other patient management decisions. A negative result may occur with  improper specimen collection/handling, submission of specimen other than nasopharyngeal swab, presence of viral mutation(s) within the areas targeted by this assay, and inadequate number of viral copies(<138 copies/mL). A negative result must be combined with clinical observations, patient history, and epidemiological information. The expected result is Negative.  Fact Sheet for Patients:  EntrepreneurPulse.com.au  Fact Sheet for Healthcare Providers:  IncredibleEmployment.be  This test is no t yet approved or cleared by the [place] FDA and  has been authorized for detection and/or diagnosis of SARS-CoV-2 by FDA under an Emergency Use Authorization (EUA). This EUA will remain  in effect (meaning this test can be used) for the duration  of the COVID-19 declaration under Section 564(b)(1) of the Act, 21 U.S.C.section 360bbb-3(b)(1), unless the authorization is terminated  or revoked sooner.       Influenza A by PCR NEGATIVE NEGATIVE Final   Influenza B by PCR NEGATIVE NEGATIVE Final    Comment: (NOTE) The Xpert Xpress SARS-CoV-2/FLU/RSV plus assay is intended as an aid in the diagnosis of influenza from Nasopharyngeal swab specimens and should not be used as a sole basis for treatment. Nasal washings and aspirates are unacceptable for Xpert Xpress SARS-CoV-2/FLU/RSV testing.  Fact  Sheet for Patients: EntrepreneurPulse.com.au  Fact Sheet for Healthcare Providers: IncredibleEmployment.be  This test is not yet approved or cleared by the [place] FDA and has been authorized for detection and/or diagnosis of SARS-CoV-2 by FDA under an Emergency Use Authorization (EUA). This EUA will remain in effect (meaning this test can be used) for the duration of the COVID-19 declaration under Section 564(b)(1) of the Act, 21 U.S.C. section 360bbb-3(b)(1), unless the authorization is terminated or revoked.  Performed at [facility], [place] [address], [place], [place] [address]          Radiology Studies: VAS US CAROTID  Result Date: 12/21/2021 Carotid Arterial Duplex Study Patient Name:  THEOPHILUS WALZ  Date of Exam:   12/21/2021 Medical Rec #: [id number]      Accession #:    [id number] Date of Birth: [birth year]       Patient Gender: M Patient Age:   36 years Exam Location:  [facility] Procedure:      VAS US CAROTID Referring Phys: Bretta Bang GONFA --------------------------------------------------------------------------------  Indications:       Syncope. Risk Factors:      Hypertension, hyperlipidemia. Comparison Study:  02/05/2018 Carotid artery duplex- right- no evidence of                    hemodynamically significant stenosis, left 1-39% ICA                    stenosis. Performing Technologist: Maudry Mayhew MHA, RDMS, RVT, RDCS  Examination Guidelines: A complete evaluation includes B-mode imaging, spectral Doppler, color Doppler, and power Doppler as needed of all accessible portions of each vessel. Bilateral testing is considered an integral part of a complete examination. Limited examinations for reoccurring indications may be performed as noted.  Right Carotid Findings: +----------+--------+--------+--------+------------------+------------------+           PSV cm/sEDV cm/sStenosisPlaque DescriptionComments            +----------+--------+--------+--------+------------------+------------------+ CCA Prox  102     12                                                   +----------+--------+--------+--------+------------------+------------------+ CCA Distal74      8                                 intimal thickening +----------+--------+--------+--------+------------------+------------------+ ICA Prox  96      14                                                   +----------+--------+--------+--------+------------------+------------------+ ICA Distal104  17                                                   +----------+--------+--------+--------+------------------+------------------+ ECA       79                                                           +----------+--------+--------+--------+------------------+------------------+ +----------+--------+-------+----------------+-------------------+           PSV cm/sEDV cmsDescribe        Arm Pressure (mmHG) +----------+--------+-------+----------------+-------------------+ Subclavian170            Multiphasic, WNL                    +----------+--------+-------+----------------+-------------------+ +---------+--------+--+--------+--+---------+ VertebralPSV cm/s60EDV cm/s11Antegrade +---------+--------+--+--------+--+---------+  Left Carotid Findings: +----------+--------+--------+--------+-----------------------+--------+           PSV cm/sEDV cm/sStenosisPlaque Description     Comments +----------+--------+--------+--------+-----------------------+--------+ CCA Prox  140     13                                              +----------+--------+--------+--------+-----------------------+--------+ CCA Distal138     15              smooth and homogeneous          +----------+--------+--------+--------+-----------------------+--------+ ICA Prox  89      14              smooth and heterogenous          +----------+--------+--------+--------+-----------------------+--------+ ICA Distal100     16                                              +----------+--------+--------+--------+-----------------------+--------+ ECA       92                                                      +----------+--------+--------+--------+-----------------------+--------+ +----------+--------+--------+----------------+-------------------+           PSV cm/sEDV cm/sDescribe        Arm Pressure (mmHG) +----------+--------+--------+----------------+-------------------+ [id number]             Multiphasic, WNL                    +----------+--------+--------+----------------+-------------------+ +---------+--------+--+--------+-+---------+ VertebralPSV cm/s60EDV cm/s9Antegrade +---------+--------+--+--------+-+---------+   Summary: Right Carotid: The extracranial vessels were near-normal with only minimal wall                thickening or plaque. Left Carotid: Velocities in the left ICA are consistent with a 1-39% stenosis. Vertebrals:  Bilateral vertebral arteries demonstrate antegrade flow. Subclavians: Normal flow hemodynamics were seen in bilateral subclavian              arteries. No significant change when compared to prior study 02/05/2018. *See table(s) above  for measurements and observations.  Electronically signed by Servando Snare MD on 12/21/2021 at 2:54:41 PM.    Final    ECHOCARDIOGRAM COMPLETE  Result Date: 12/20/2021    ECHOCARDIOGRAM REPORT   Patient Name:   BERYL BALZ Date of Exam: 12/20/2021 Medical Rec #:  [id number]     Height:       68.0 in Accession #:    [id number]    Weight:       165.3 lb Date of Birth:  [birth year]      BSA:          1.885 m Patient Age:    17 years      BP:           157/60 mmHg Patient Gender: M             HR:           53 bpm. Exam Location:  Inpatient Procedure: 2D Echo, Cardiac Doppler, Color Doppler and Strain Analysis Indications:     R55 Syncope  History:          Patient has prior history of Echocardiogram examinations, most                  recent 05/18/2021. CHF, Abnormal ECG, Aortic Valve Disease,                  Arrythmias:Bradycardia, Signs/Symptoms:Altered Mental Status,                  Alzheimer's, Syncope and Chest Pain; Risk Factors:Hypertension,                  Dyslipidemia and Former Smoker. Severe aortic stenosis.                  Aortic Valve: 23 mm Edwards Sapien prosthetic, stented (TAVR)                  valve is present in the aortic position.  Sonographer:     Roseanna Rainbow RDCS Referring Phys:  [id number] Ilene Qua OPYD Diagnosing Phys: Oswaldo Milian MD  Sonographer Comments: Global longitudinal strain was attempted. IMPRESSIONS  1. Left ventricular ejection fraction, by estimation, is 70 to 75%. The left ventricle has hyperdynamic function. The left ventricle has no regional wall motion abnormalities. There is mild left ventricular hypertrophy. Left ventricular diastolic parameters are indeterminate.  2. Right ventricular systolic function is normal. The right ventricular size is normal. There is normal pulmonary artery systolic pressure. The estimated right ventricular systolic pressure is 44.3 mmHg.  3. The mitral valve is normal in structure. No evidence of mitral valve regurgitation. No evidence of mitral stenosis.  4. The inferior vena cava is normal in size with greater than 50% respiratory variability, suggesting right atrial pressure of 3 mmHg.  5. There is a 23 mm Edwards Sapien prosthetic (TAVR) valve present in the aortic position. Mild perivalvular leak. Vmax 4.5 m/s, MG 45 mmHg, EOA 1.5 cm^2, DI 0.38, AT 77m. Marked increase in gradients through AV compared to prior echo (149mg on 05/18/21). AT is low, and has increased LVOT gradient, suggesting high flow state contributing to elevated AV gradients, though not enough to explain his MG 4531m. Prosthetic valve is not well visualized, would recommend TEE to better assess etiology of  increased gradients FINDINGS  Left Ventricle: Left ventricular ejection fraction, by estimation, is 70 to 75%. The left ventricle has hyperdynamic function. The left ventricle has no regional wall motion abnormalities.  The left ventricular internal cavity size was normal in size. There is mild left ventricular hypertrophy. Left ventricular diastolic parameters are indeterminate. Right Ventricle: The right ventricular size is normal. No increase in right ventricular wall thickness. Right ventricular systolic function is normal. There is normal pulmonary artery systolic pressure. The tricuspid regurgitant velocity is 2.33 m/s, and  with an assumed right atrial pressure of 3 mmHg, the estimated right ventricular systolic pressure is 63.7 mmHg. Left Atrium: Left atrial size was normal in size. Right Atrium: Right atrial size was normal in size. Pericardium: Trivial pericardial effusion is present. Mitral Valve: The mitral valve is normal in structure. No evidence of mitral valve regurgitation. No evidence of mitral valve stenosis. MV peak gradient, 8.8 mmHg. The mean mitral valve gradient is 2.0 mmHg. Tricuspid Valve: The tricuspid valve is normal in structure. Tricuspid valve regurgitation is trivial. Aortic Valve: The aortic valve has been repaired/replaced. Aortic valve regurgitation is mild. Aortic regurgitation PHT measures 769 msec. Aortic valve mean gradient measures 39.1 mmHg. Aortic valve peak gradient measures 72.0 mmHg. Aortic valve area, by  VTI measures 1.66 cm. There is a 23 mm Edwards Sapien prosthetic, stented (TAVR) valve present in the aortic position. Pulmonic Valve: The pulmonic valve was not well visualized. Pulmonic valve regurgitation is not visualized. Aorta: The aortic root and ascending aorta are structurally normal, with no evidence of dilitation. Venous: The inferior vena cava is normal in size with greater than 50% respiratory variability, suggesting right atrial pressure of 3 mmHg.  IAS/Shunts: The interatrial septum was not well visualized.  LEFT VENTRICLE PLAX 2D LVIDd:         4.20 cm     Diastology LVIDs:         2.60 cm     LV e' medial:    5.33 cm/s LV PW:         1.00 cm     LV E/e' medial:  15.4 LV IVS:        1.10 cm     LV e' lateral:   5.98 cm/s LVOT diam:     2.30 cm     LV E/e' lateral: 13.7 LV SV:         161 LV SV Index:   85 LVOT Area:     4.15 cm  LV Volumes (MOD) LV vol d, MOD A4C: 77.1 ml LV vol s, MOD A4C: 19.6 ml LV SV MOD A4C:     77.1 ml RIGHT VENTRICLE             IVC RV S prime:     15.70 cm/s  IVC diam: 1.70 cm TAPSE (M-mode): 2.1 cm LEFT ATRIUM             Index        RIGHT ATRIUM           Index LA diam:        2.80 cm 1.49 cm/m   RA Area:     10.40 cm LA Vol (A2C):   35.1 ml 18.62 ml/m  RA Volume:   18.70 ml  9.92 ml/m LA Vol (A4C):   28.6 ml 15.17 ml/m LA Biplane Vol: 32.0 ml 16.98 ml/m  AORTIC VALVE AV Area (Vmax):    1.65 cm AV Area (Vmean):   1.61 cm AV Area (VTI):     1.66 cm AV Vmax:           424.12 cm/s AV Vmean:  288.487 cm/s AV VTI:            0.968 m AV Peak Grad:      72.0 mmHg AV Mean Grad:      39.1 mmHg LVOT Vmax:         168.00 cm/s LVOT Vmean:        112.000 cm/s LVOT VTI:          0.387 m LVOT/AV VTI ratio: 0.40 AI PHT:            769 msec  AORTA Ao Root diam: 2.70 cm Ao Asc diam:  3.60 cm MITRAL VALVE                TRICUSPID VALVE MV Area (PHT): 2.16 cm     TR Peak grad:   21.7 mmHg MV Area VTI:   3.01 cm     TR Vmax:        233.00 cm/s MV Peak grad:  8.8 mmHg MV Mean grad:  2.0 mmHg     SHUNTS MV Vmax:       1.48 m/s     Systemic VTI:  0.39 m MV Vmean:      63.6 cm/s    Systemic Diam: 2.30 cm MV Decel Time: 352 msec MV E velocity: 82.00 cm/s MV A velocity: 144.00 cm/s MV E/A ratio:  0.57 Oswaldo Milian MD Electronically signed by Oswaldo Milian MD Signature Date/Time: 12/20/2021/3:58:26 PM    Final (Updated)         Scheduled Meds:  aspirin  81 mg Oral Daily   dutasteride  0.5 mg Oral Daily   enoxaparin  (LOVENOX) injection  40 mg Subcutaneous Q24H   escitalopram  20 mg Oral QHS   multivitamin with minerals  1 tablet Oral Daily   pantoprazole  40 mg Oral Daily   sodium chloride flush  3 mL Intravenous Q12H   Continuous Infusions:        Aline August, MD [facility] 12/22/2021, 10:40 AM

## 2021-12-22 NOTE — Progress Notes (Signed)
Offered pt ambulation in hallway.   Walked down short hallway and he became slightly dizzy but insisted he walk down long hallways.   Walked down long hallways and he became dizzy.   Pt advised to sit in chair upon walking both hallways and encouraged to drink water. Daughter made aware of changes during ambulation.

## 2021-12-22 NOTE — Progress Notes (Signed)
Progress Note  Patient Name: [name] Date of Encounter: 12/22/2021  [facility] Cardiologist: Candee Furbish, MD   Subjective   No acute overnight events. Patient states he is feeling fine. He denies any recurrent dizziness. No chest pain or shortness breath. He states he is feeling fine. However, daughter who was on the phone with [place] states that this is not true and that he is just wants to go home. She states he was complaining of dizziness all throughout the day yesterday and has not been up much.  Unclear whether issues with TAVR valve are causing these symptoms. He is scheduled for a TEE on Friday 12/24/2021 at 10:30am (first availability).   Inpatient Medications    Scheduled Meds:  aspirin  81 mg Oral Daily   dutasteride  0.5 mg Oral Daily   enoxaparin (LOVENOX) injection  40 mg Subcutaneous Q24H   escitalopram  20 mg Oral QHS   multivitamin with minerals  1 tablet Oral Daily   pantoprazole  40 mg Oral Daily   sodium chloride flush  3 mL Intravenous Q12H   Continuous Infusions:  PRN Meds: acetaminophen **OR** acetaminophen, melatonin, ondansetron **OR** ondansetron (ZOFRAN) IV, mouth rinse, senna-docusate   Vital Signs    Vitals:   12/21/21 1248 12/21/21 1252 12/21/21 2019 12/22/21 0519  BP: (!) 115/48 (!) 121/55 (!) 152/60 (!) 156/70  Pulse: 67 70 64 (!) 51  Resp:   (!) 22   Temp:   97.7 F (36.5 C) (!) 97.5 F (36.4 C)  TempSrc:   Oral Oral  SpO2:   96% 96%  Weight:      Height:        Intake/Output Summary (Last 24 hours) at 12/22/2021 0957 Last data filed at 12/22/2021 0943 Gross per 24 hour  Intake 223 ml  Output 200 ml  Net 23 ml      12/19/2021   11:21 PM 10/04/2019    9:28 PM 10/11/2018   10:32 AM  Last 3 Weights  Weight (lbs) 165 lb 5.5 oz 165 lb 5.5 oz 160 lb 7.9 oz  Weight (kg) 75 kg 75 kg 72.8 kg      Telemetry    Sinus rhythm with rates in the 50s to 60s. - Personally Reviewed  ECG    No new ECG tracing today. - Personally  Reviewed  Physical Exam   GEN: No acute distress.   Neck: No JVD. Cardiac: Bradycardic with normal rhythm. III/VI systolic murmur. Radial pulses 2+ and equal. Respiratory: Clear to auscultation bilaterally. No wheezes, rhonchi, or rales. GI: Soft, non-distended, and non-tender. MS: No lower extremity edema. No deformity. Skin: Warm and dry. Neuro:  No focal deficits. Psych: Normal affect. Responds appropriately.  Labs    High Sensitivity Troponin:   Recent Labs  Lab 12/20/21 0010 12/20/21 0143  TROPONINIHS 8 9     Chemistry Recent Labs  Lab 12/19/21 2326 12/20/21 0010 12/21/21 0543  NA 134*  --  138  K 3.8  --  4.1  CL 102  --  104  CO2 25  --  28  GLUCOSE 145*  --  107*  BUN 19  --  17  CREATININE 1.05  --  0.97  CALCIUM 8.6*  --  8.8*  MG  --   --  2.0  PROT  --  7.0 6.5  ALBUMIN  --  4.0 3.6  AST  --  29 28  ALT  --  36 33  ALKPHOS  --  55 52  BILITOT  --  1.8* 1.8*  GFRNONAA >60  --  >60  ANIONGAP 7  --  6    Lipids No results for input(s): "CHOL", "TRIG", "HDL", "LABVLDL", "LDLCALC", "CHOLHDL" in the last 168 hours.  Hematology Recent Labs  Lab 12/19/21 2326 12/21/21 0543  WBC 7.9 6.7  RBC 4.90 4.92  HGB 14.5 14.5  HCT 43.8 44.1  MCV 89.4 89.6  MCH 29.6 29.5  MCHC 33.1 32.9  RDW 12.2 12.3  PLT 141* 143*   Thyroid  Recent Labs  Lab 12/21/21 0543  TSH 5.610*  FREET4 1.03    BNPNo results for input(s): "BNP", "PROBNP" in the last 168 hours.  DDimer No results for input(s): "DDIMER" in the last 168 hours.   Radiology    VAS US CAROTID  Result Date: 12/21/2021 Carotid Arterial Duplex Study Patient Name:  [name]  Date of Exam:   12/21/2021 Medical Rec #: [id number]      Accession #:    [id number] Date of Birth: [birth year]       Patient Gender: M Patient Age:   88 years Exam Location:  [facility] Procedure:      VAS US CAROTID Referring Phys: Bretta Bang GONFA  --------------------------------------------------------------------------------  Indications:       Syncope. Risk Factors:      Hypertension, hyperlipidemia. Comparison Study:  02/05/2018 Carotid artery duplex- right- no evidence of                    hemodynamically significant stenosis, left 1-39% ICA                    stenosis. Performing Technologist: Maudry Mayhew MHA, RDMS, RVT, RDCS  Examination Guidelines: A complete evaluation includes B-mode imaging, spectral Doppler, color Doppler, and power Doppler as needed of all accessible portions of each vessel. Bilateral testing is considered an integral part of a complete examination. Limited examinations for reoccurring indications may be performed as noted.  Right Carotid Findings: +----------+--------+--------+--------+------------------+------------------+           PSV cm/sEDV cm/sStenosisPlaque DescriptionComments           +----------+--------+--------+--------+------------------+------------------+ CCA Prox  102     12                                                   +----------+--------+--------+--------+------------------+------------------+ CCA Distal74      8                                 intimal thickening +----------+--------+--------+--------+------------------+------------------+ ICA Prox  96      14                                                   +----------+--------+--------+--------+------------------+------------------+ ICA Distal104     17                                                   +----------+--------+--------+--------+------------------+------------------+ ECA  79                                                           +----------+--------+--------+--------+------------------+------------------+ +----------+--------+-------+----------------+-------------------+           PSV cm/sEDV cmsDescribe        Arm Pressure (mmHG)  +----------+--------+-------+----------------+-------------------+ Subclavian170            Multiphasic, WNL                    +----------+--------+-------+----------------+-------------------+ +---------+--------+--+--------+--+---------+ VertebralPSV cm/s60EDV cm/s11Antegrade +---------+--------+--+--------+--+---------+  Left Carotid Findings: +----------+--------+--------+--------+-----------------------+--------+           PSV cm/sEDV cm/sStenosisPlaque Description     Comments +----------+--------+--------+--------+-----------------------+--------+ CCA Prox  140     13                                              +----------+--------+--------+--------+-----------------------+--------+ CCA Distal138     15              smooth and homogeneous          +----------+--------+--------+--------+-----------------------+--------+ ICA Prox  89      14              smooth and heterogenous         +----------+--------+--------+--------+-----------------------+--------+ ICA Distal100     16                                              +----------+--------+--------+--------+-----------------------+--------+ ECA       92                                                      +----------+--------+--------+--------+-----------------------+--------+ +----------+--------+--------+----------------+-------------------+           PSV cm/sEDV cm/sDescribe        Arm Pressure (mmHG) +----------+--------+--------+----------------+-------------------+ [id number]             Multiphasic, WNL                    +----------+--------+--------+----------------+-------------------+ +---------+--------+--+--------+-+---------+ VertebralPSV cm/s60EDV cm/s9Antegrade +---------+--------+--+--------+-+---------+   Summary: Right Carotid: The extracranial vessels were near-normal with only minimal wall                thickening or plaque. Left Carotid: Velocities in the  left ICA are consistent with a 1-39% stenosis. Vertebrals:  Bilateral vertebral arteries demonstrate antegrade flow. Subclavians: Normal flow hemodynamics were seen in bilateral subclavian              arteries. No significant change when compared to prior study 02/05/2018. *See table(s) above for measurements and observations.  Electronically signed by Servando Snare MD on 12/21/2021 at 2:54:41 PM.    Final    ECHOCARDIOGRAM COMPLETE  Result Date: 12/20/2021    ECHOCARDIOGRAM REPORT   Patient Name:   LESSIE MANIGO Date of Exam: 12/20/2021 Medical Rec #:  [id number]     Height:  68.0 in Accession #:    [id number]    Weight:       165.3 lb Date of Birth:  09/19/1927      BSA:          1.885 m Patient Age:    60 years      BP:           157/60 mmHg Patient Gender: M             HR:           53 bpm. Exam Location:  Inpatient Procedure: 2D Echo, Cardiac Doppler, Color Doppler and Strain Analysis Indications:     R55 Syncope  History:         Patient has prior history of Echocardiogram examinations, most                  recent 05/18/2021. CHF, Abnormal ECG, Aortic Valve Disease,                  Arrythmias:Bradycardia, Signs/Symptoms:Altered Mental Status,                  Alzheimer's, Syncope and Chest Pain; Risk Factors:Hypertension,                  Dyslipidemia and Former Smoker. Severe aortic stenosis.                  Aortic Valve: 23 mm Edwards Sapien prosthetic, stented (TAVR)                  valve is present in the aortic position.  Sonographer:     Roseanna Rainbow RDCS Referring Phys:  [id number] Ilene Qua OPYD Diagnosing Phys: Oswaldo Milian MD  Sonographer Comments: Global longitudinal strain was attempted. IMPRESSIONS  1. Left ventricular ejection fraction, by estimation, is 70 to 75%. The left ventricle has hyperdynamic function. The left ventricle has no regional wall motion abnormalities. There is mild left ventricular hypertrophy. Left ventricular diastolic parameters are indeterminate.  2. Right  ventricular systolic function is normal. The right ventricular size is normal. There is normal pulmonary artery systolic pressure. The estimated right ventricular systolic pressure is 07.3 mmHg.  3. The mitral valve is normal in structure. No evidence of mitral valve regurgitation. No evidence of mitral stenosis.  4. The inferior vena cava is normal in size with greater than 50% respiratory variability, suggesting right atrial pressure of 3 mmHg.  5. There is a 23 mm Edwards Sapien prosthetic (TAVR) valve present in the aortic position. Mild perivalvular leak. Vmax 4.5 m/s, MG 45 mmHg, EOA 1.5 cm^2, DI 0.38, AT 85m. Marked increase in gradients through AV compared to prior echo (137mg on 05/18/21). AT is low, and has increased LVOT gradient, suggesting high flow state contributing to elevated AV gradients, though not enough to explain his MG 4586m. Prosthetic valve is not well visualized, would recommend TEE to better assess etiology of increased gradients FINDINGS  Left Ventricle: Left ventricular ejection fraction, by estimation, is 70 to 75%. The left ventricle has hyperdynamic function. The left ventricle has no regional wall motion abnormalities. The left ventricular internal cavity size was normal in size. There is mild left ventricular hypertrophy. Left ventricular diastolic parameters are indeterminate. Right Ventricle: The right ventricular size is normal. No increase in right ventricular wall thickness. Right ventricular systolic function is normal. There is normal pulmonary artery systolic pressure. The tricuspid regurgitant velocity is 2.33 m/s, and  with an assumed right  atrial pressure of 3 mmHg, the estimated right ventricular systolic pressure is 85.4 mmHg. Left Atrium: Left atrial size was normal in size. Right Atrium: Right atrial size was normal in size. Pericardium: Trivial pericardial effusion is present. Mitral Valve: The mitral valve is normal in structure. No evidence of mitral valve  regurgitation. No evidence of mitral valve stenosis. MV peak gradient, 8.8 mmHg. The mean mitral valve gradient is 2.0 mmHg. Tricuspid Valve: The tricuspid valve is normal in structure. Tricuspid valve regurgitation is trivial. Aortic Valve: The aortic valve has been repaired/replaced. Aortic valve regurgitation is mild. Aortic regurgitation PHT measures 769 msec. Aortic valve mean gradient measures 39.1 mmHg. Aortic valve peak gradient measures 72.0 mmHg. Aortic valve area, by  VTI measures 1.66 cm. There is a 23 mm Edwards Sapien prosthetic, stented (TAVR) valve present in the aortic position. Pulmonic Valve: The pulmonic valve was not well visualized. Pulmonic valve regurgitation is not visualized. Aorta: The aortic root and ascending aorta are structurally normal, with no evidence of dilitation. Venous: The inferior vena cava is normal in size with greater than 50% respiratory variability, suggesting right atrial pressure of 3 mmHg. IAS/Shunts: The interatrial septum was not well visualized.  LEFT VENTRICLE PLAX 2D LVIDd:         4.20 cm     Diastology LVIDs:         2.60 cm     LV e' medial:    5.33 cm/s LV PW:         1.00 cm     LV E/e' medial:  15.4 LV IVS:        1.10 cm     LV e' lateral:   5.98 cm/s LVOT diam:     2.30 cm     LV E/e' lateral: 13.7 LV SV:         161 LV SV Index:   85 LVOT Area:     4.15 cm  LV Volumes (MOD) LV vol d, MOD A4C: 77.1 ml LV vol s, MOD A4C: 19.6 ml LV SV MOD A4C:     77.1 ml RIGHT VENTRICLE             IVC RV S prime:     15.70 cm/s  IVC diam: 1.70 cm TAPSE (M-mode): 2.1 cm LEFT ATRIUM             Index        RIGHT ATRIUM           Index LA diam:        2.80 cm 1.49 cm/m   RA Area:     10.40 cm LA Vol (A2C):   35.1 ml 18.62 ml/m  RA Volume:   18.70 ml  9.92 ml/m LA Vol (A4C):   28.6 ml 15.17 ml/m LA Biplane Vol: 32.0 ml 16.98 ml/m  AORTIC VALVE AV Area (Vmax):    1.65 cm AV Area (Vmean):   1.61 cm AV Area (VTI):     1.66 cm AV Vmax:           424.12 cm/s AV Vmean:           288.487 cm/s AV VTI:            0.968 m AV Peak Grad:      72.0 mmHg AV Mean Grad:      39.1 mmHg LVOT Vmax:         168.00 cm/s LVOT Vmean:        112.000 cm/s  LVOT VTI:          0.387 m LVOT/AV VTI ratio: 0.40 AI PHT:            769 msec  AORTA Ao Root diam: 2.70 cm Ao Asc diam:  3.60 cm MITRAL VALVE                TRICUSPID VALVE MV Area (PHT): 2.16 cm     TR Peak grad:   21.7 mmHg MV Area VTI:   3.01 cm     TR Vmax:        233.00 cm/s MV Peak grad:  8.8 mmHg MV Mean grad:  2.0 mmHg     SHUNTS MV Vmax:       1.48 m/s     Systemic VTI:  0.39 m MV Vmean:      63.6 cm/s    Systemic Diam: 2.30 cm MV Decel Time: 352 msec MV E velocity: 82.00 cm/s MV A velocity: 144.00 cm/s MV E/A ratio:  0.57 Oswaldo Milian MD Electronically signed by Oswaldo Milian MD Signature Date/Time: 12/20/2021/3:58:26 PM    Final (Updated)     Cardiac Studies   Echocardiogram 12/20/2021: Impressions: 1. Left ventricular ejection fraction, by estimation, is 70 to 75%. The  left ventricle has hyperdynamic function. The left ventricle has no  regional wall motion abnormalities. There is mild left ventricular  hypertrophy. Left ventricular diastolic  parameters are indeterminate.   2. Right ventricular systolic function is normal. The right ventricular  size is normal. There is normal pulmonary artery systolic pressure. The  estimated right ventricular systolic pressure is 47.8 mmHg.   3. The mitral valve is normal in structure. No evidence of mitral valve  regurgitation. No evidence of mitral stenosis.   4. The inferior vena cava is normal in size with greater than 50%  respiratory variability, suggesting right atrial pressure of 3 mmHg.   5. There is a 23 mm Edwards Sapien prosthetic (TAVR) valve present in the  aortic position. Mild perivalvular leak. Vmax 4.5 m/s, MG 45 mmHg, EOA 1.5  cm^2, DI 0.38, AT 63m. Marked increase in gradients through AV compared  to prior echo (151mg on  05/18/21). AT is low,  and has increased LVOT gradient, suggesting high flow  state contributing to elevated AV gradients, though not enough to explain  his MG 4526m. Prosthetic valve is not well visualized, would recommend  TEE to better assess etiology of  increased gradients   Patient Profile     IlyMitsuo Budnick a [age]o. male with a history of mild non-obstructive CAD on cardiac catheterization in 01/2018, severe aortic stenosis s/p TAVR in 03/2018, hypertension, hyperlipidemia, GERD, Gilbert's syndrome, rheumatoid arthritis, and mild dementia who is being seen for the evaluation of near syncope at the request of Dr. GonCyndia SkeetersAssessment & Plan    Dizziness Near Syncope History of Severe Aortic Stenosis s/p TAVR in 2019 with Increased Gradients Patient presented after near syncope episodes that was preceded by vomiting. Also reports dizziness on exertion over the last few months with associated vomiting. No palpitations or overt syncope. Mild bradycardic at baseline but BP stable and on the higher side. Orthostatic borderline with systolic BP dropping from 165 sitting to 147 standing. Received IV fluids in the ED. Echo this admission showed normal LV function with marked increase in gradients of TAVR valve to 45 mmHg. AT is low and he has increased LVOT suggesting high flow state contributing to elevated AV gradients. Prosthetic valve  was not well visualized. Dr. Debara Pickett personally reviewed images and did see leaflet motion.  Exam is more consistent with moderate AS.  He was also described as having severe AI but per Dr. Debara Pickett color doppler appears to be more mild to moderate but there is also separate perivalvular leak. - Patient denies any recurrent dizziness; however, daughter states he is just saying that because he wants to go home. - Unclear whether valve issues is causing his dizziness/near syncope. No concerning arrhythmias noted on telemetry. He has baseline bradycardia with rates in the low 50s at times but I  don't think this should be causing his symptoms. - Plan is for TEE. Unfortunately there is no availability until Friday 12/24/2021. He is currently on the board from 10:30am.He was consented for procedure yesterday using [place].  From our perspective, it is OK for patient to be discharged if he is feeling better and come back for this on procedure as an outpatient on Friday. However, daughter is concerned about doing it as an outpatient because she states he does not come out of anesthesia well and it normally takes him a wall. She is also worried about his balance issues after anesthesia and would like him monitor for a while afterwards. I did try to reassure her that even with an outpatient procedure, we monitor him afterwards and makes sure he comes out of anesthesia well before sending him back home. She would like to discuss with a family member who is a PA. Advised her that if they do decide to do this as an outpatient that they will need to be at [facility] at 9:15am on Friday 12/24/2021.  Daughter Gregary Cromer) would like for Dr. Debara Pickett to call when he sees patient. Her number is [phone].   Mild Non-Obstructive CAD Noted on cardiac catheterization prior to TAVR in 2019.  - No angina. - Continue aspirin. Does not appear to be on a statin. Don't think there is much benefit in adding one at this time given his advanced age.   Hypertension BP mildly elevated at times. Repeat orthostatic yesterday negative (although systolic BP did drop from 131 supine to 115 standing). - Home Losartan has been held due to borderline orthostasis. We may have to allow for some permissive hypertension in order to avoid orthostasis and worsening dizziness in this  88 year old patient.   Otherwise, per primary team: - Nausea/vomiting - Hyperbilirubinemia - Gilbert's syndrome - Unknown liver cirrhosis (likely alcoholic) - Rheumatoid arthritis - Dementia - BPH  Used telephone [place]  interpretor (ID [phone]) for entirety of visit.  For questions or updates, please contact [place] Please consult [email or web address] for contact info under        Signed, Darreld Mclean, PA-C  12/22/2021, 9:57 AM

## 2021-12-22 NOTE — Progress Notes (Signed)
Initial Nutrition Assessment  INTERVENTION:   -Ensure Plus High Protein po BID, each supplement provides 350 kcal and 20 grams of protein.   NUTRITION DIAGNOSIS:   Increased nutrient needs related to chronic illness as evidenced by estimated needs.  GOAL:   Patient will meet greater than or equal to 90% of their needs  MONITOR:   PO intake, Supplement acceptance, Labs, Weight trends, I & O's  REASON FOR ASSESSMENT:   Consult Assessment of nutrition requirement/status  ASSESSMENT:   88 year old M with PMH of dementia, nonobstructive CAD, severe AS s/p TAVR, liver cirrhosis, RA, HTN, alcohol abuse, sinus bradycardia and BPH presenting with lightheadedness, nausea, vomiting, diaphoresis and generalized weakness for 1 to 2 days and admitted for near syncope.  Patient  with dementia and HOH. [place] speaking and no family at bedside at time of visit. Pt currently consuming 100% of meals today. Has started eating better.  Awaiting TEE on 7/14.  Given advanced age and liver cirrhosis, will order Ensure supplements for additional kcals and protein.  Per weight records, no recent weight changes.  Medications: Multivitamin with minerals daily  Labs reviewed:  CBGs: 108-128  NUTRITION - FOCUSED PHYSICAL EXAM:  Unable to complete at this time.  Diet Order:   Diet Order             Diet regular Room service appropriate? Yes; Fluid consistency: Thin  Diet effective now                   EDUCATION NEEDS:   Not appropriate for education at this time  Skin:  Skin Assessment: Reviewed RN Assessment  Last BM:  7/11  Height:   Ht Readings from Last 1 Encounters:  12/19/21 '5\' 8"'$  (1.727 m)    Weight:   Wt Readings from Last 1 Encounters:  12/19/21 75 kg    BMI:  Body mass index is 25.14 kg/m.  Estimated Nutritional Needs:   Kcal:  1900-2100  Protein:  100-115g  Fluid:  2L/day   Clayton Bibles, MS, RD, LDN Inpatient Clinical Dietitian Contact  information available via Amion

## 2021-12-22 NOTE — Progress Notes (Signed)
Occupational Therapy Treatment Patient Details Name: [name] MRN: [id number] DOB: Aug 03, 1927 Today's Date: 12/22/2021   History of present illness Patient is a 88 year old male who presented to the hosptial after near syncopal episode.  PMH: chronic diastolic heart failure, HTN, s/p TAVR, diverticulosis, GERD, gilbert's syndrome, HOH, rheumatoid arthritis, CKD   OT comments  Patient was noted to have had improvement in participation in ADLs with standing at sink with MI to complete oral hygiene tasks. Patient was able to complete functional mobility in hallway with no AD to improve functional activity tolerance. Patient denies dizziness at this time. Patients daughter was present at end of session and updated on recommendations. OT to continue to follow acutely.    Recommendations for follow up therapy are one component of a multi-disciplinary discharge planning process, led by the attending physician.  Recommendations may be updated based on patient status, additional functional criteria and insurance authorization.    Follow Up Recommendations  No OT follow up    Assistance Recommended at Discharge Intermittent Supervision/Assistance  Patient can return home with the following  A lot of help with bathing/dressing/bathroom;Direct supervision/assist for financial management;Direct supervision/assist for medications management;Assistance with cooking/housework;Help with stairs or ramp for entrance;Assist for transportation   Equipment Recommendations  None recommended by OT    Recommendations for Other Services      Precautions / Restrictions Precautions Precautions: Fall Precaution Comments: monitor vitals Restrictions Weight Bearing Restrictions: No       Mobility Bed Mobility Overal bed mobility: Needs Assistance       Supine to sit: Supervision, HOB elevated          Transfers                         Balance Overall balance assessment: Mild deficits  observed, not formally tested                                         ADL either performed or assessed with clinical judgement   ADL Overall ADL's : Needs assistance/impaired     Grooming: Wash/dry face;Oral care;Modified independent Grooming Details (indicate cue type and reason): standing at sink no AD                 Toilet Transfer: Modified Engineer, manufacturing systems Details (indicate cue type and reason): no AD Toileting- Clothing Manipulation and Hygiene: Modified independent;Sit to/from stand              Extremity/Trunk Assessment              Vision       Perception     Praxis      Cognition Arousal/Alertness: Awake/alert Behavior During Therapy: WFL for tasks assessed/performed Overall Cognitive Status: Difficult to assess                                          Exercises      Shoulder Instructions       General Comments      Pertinent Vitals/ Pain       Pain Assessment Pain Assessment: No/denies pain  Home Living  Prior Functioning/Environment              Frequency  Min 2X/week        Progress Toward Goals  OT Goals(current goals can now be found in the care plan section)  Progress towards OT goals: Progressing toward goals     Plan Discharge plan remains appropriate    Co-evaluation                 AM-PAC OT "6 Clicks" Daily Activity     Outcome Measure   Help from another person eating meals?: None Help from another person taking care of personal grooming?: None   Help from another person bathing (including washing, rinsing, drying)?: A Little Help from another person to put on and taking off regular upper body clothing?: A Little Help from another person to put on and taking off regular lower body clothing?: A Little 6 Click Score: 17    End of Session    OT Visit Diagnosis: Muscle  weakness (generalized) (M62.81)   Activity Tolerance Patient tolerated treatment well   Patient Left in chair;with call bell/phone within reach;with family/visitor present   Nurse Communication Other (comment) (ok to participate)        Time: 2446-9507 OT Time Calculation (min): 37 min  Charges: OT General Charges $OT Visit: 1 Visit OT Treatments $Self Care/Home Management : 8-22 mins $Therapeutic Activity: 8-22 mins  Jackelyn Poling OTR/L, MS Acute Rehabilitation Department Office# [phone] Pager# [phone]   Marcellina Millin 12/22/2021, 3:51 PM

## 2021-12-22 NOTE — Consult Note (Signed)
Consultation Note Date: 12/22/2021   Patient Name: [name]  DOB: Boyle 21, 1929  MRN: [id number]  Age / Sex: 88 y.o., male  PCP: Ryan Manes, MD Referring Physician: Aline August, MD  Reason for Consultation: Establishing goals of care  HPI/Patient Profile: 88 y.o. male  admitted on 12/19/2021   Clinical Assessment and Goals of Care: 88 year old gentleman admitted to hospital medicine service with cardiology colleagues following for dizziness near syncope history of severe arctic stenosis status post TAVR in 2019.  Patient is being followed by cardiology.  Concern for at least moderate aortic stenosis and the next step is TEE. Palliative medicine consult has been requested for ongoing goals of care discussions and for CODE STATUS discussion. Patient is awake alert resting in bed.  He denies any chest pain denies any shortness of breath.  He is hard of hearing.  He is in no acute distress.  He directs me to contact his daughter [name] for further discussions.  Call placed and discussed with daughter.  Introduced myself and palliative care as follows: Palliative medicine is specialized medical care for people living with serious illness. It focuses on providing relief from the symptoms and stress of a serious illness. The goal is to improve quality of life for both the patient and the family. Goals of care: Broad aims of medical therapy in relation to the patient's values and preferences. Our aim is to provide medical care aimed at enabling patients to achieve the goals that matter most to them, given the circumstances of their particular medical situation and their constraints.  See below  HCPOA 2 daughters daughter [name] is legal and healthcare power of attorney.  SUMMARY OF RECOMMENDATIONS   Full code/full scope for now: He reportedly had made a living will several years ago which the daughter [name] wishes to  review with the patient and her sister, the patient's other daughter at a later time.  Patient's daughter [name] is currently out of town. Encourage out of bed and participation with PT/OT as per daughter's request. Continue current mode of care Recommend outpatient palliative support. Code Status/Advance Care Planning: Full code   Symptom Management:    Palliative Prophylaxis:  Delirium Protocol  Additional Recommendations (Limitations, Scope, Preferences): Full Scope Treatment  Psycho-social/Spiritual:  Desire for further Chaplaincy support:yes Additional Recommendations: Caregiving  Support/Resources  Prognosis:  Unable to determine  Discharge Planning: To Be Determined      Primary Diagnoses: Present on Admission:  Near syncope  Benign essential HTN  Dementia (HCC)  CKD (chronic kidney disease), stage II  Benign prostatic hyperplasia  Depression  RA (rheumatoid arthritis) (HCC)  Sinus bradycardia  Unspecified cirrhosis of liver ([place])   I have reviewed the medical record, interviewed the patient and family, and examined the patient. The following aspects are pertinent.  Past Medical History:  Diagnosis Date   BPH (benign prostatic hypertrophy)    Depression    Diverticulosis of colon    GERD (gastroesophageal reflux disease)    Gilbert's syndrome    History of kidney  stones    HLD (hyperlipidemia)    HOH (hard of hearing)    refuses to wear his Hearing Aid   Hypertension    Inguinal hernia    right   Internal hemorrhoid    Normal coronary arteries    a. Cath 07/2010: normal coronaries. Felt to have noncardiac CP/SOB at that time possibly r/t anxiety.   RA (rheumatoid arthritis) (HCC)    bilateral hands/ wrist--  seronegative   S/P TAVR (transcatheter aortic valve replacement) 03/27/2018   23 mm Edwards Sapien 3 transcatheter heart valve placed via percutaneous right transfemoral approach    Severe aortic stenosis    Thyroid nodule    noted 11-2009    Wears dentures    Social History   Socioeconomic History   Marital status: Widowed    Spouse name: Not on file   Number of children: Not on file   Years of education: Not on file   Highest education level: Not on file  Occupational History   Not on file  Tobacco Use   Smoking status: Former    Years: 40.00    Types: Cigarettes    Start date: 06/13/1973    Quit date: 04/03/1981    Years since quitting: 40.7   Smokeless tobacco: Never  Vaping Use   Vaping Use: Never used  Substance and Sexual Activity   Alcohol use: Yes    Comment: occasional   Drug use: No   Sexual activity: Not on file  Other Topics Concern   Not on file  Social History Narrative   Not on file   Social Determinants of Health   Financial Resource Strain: Not on file  Food Insecurity: Not on file  Transportation Needs: Not on file  Physical Activity: Not on file  Stress: Not on file  Social Connections: Not on file   Family History  Problem Relation Age of Onset   Pulmonary embolism Mother 22       caused by complications of surgery   CVA Father    Diabetes Brother    Diabetes Brother    Breast cancer Sister    Scheduled Meds:  aspirin  81 mg Oral Daily   dutasteride  0.5 mg Oral Daily   enoxaparin (LOVENOX) injection  40 mg Subcutaneous Q24H   escitalopram  20 mg Oral QHS   multivitamin with minerals  1 tablet Oral Daily   pantoprazole  40 mg Oral Daily   sodium chloride flush  3 mL Intravenous Q12H   Continuous Infusions: PRN Meds:.acetaminophen **OR** acetaminophen, melatonin, ondansetron **OR** ondansetron (ZOFRAN) IV, mouth rinse, senna-docusate Medications Prior to Admission:  Prior to Admission medications   Medication Sig Start Date End Date Taking? Authorizing Provider  aspirin 81 MG tablet Take 1 tablet (81 mg total) by mouth daily. Patient taking differently: Take 81 mg by mouth daily. 06/05/18  Yes Barrett, Evelene Croon, PA-C  dutasteride (AVODART) 0.5 MG capsule Take 0.5 mg by  mouth daily.   Yes [provider]  escitalopram (LEXAPRO) 20 MG tablet Take 1 tablet (20 mg total) by mouth daily. Patient taking differently: Take 20 mg by mouth at bedtime. 09/06/16  Yes Angiulli, Lavon Paganini, PA-C  losartan (COZAAR) 25 MG tablet Take 25 mg by mouth daily.  01/21/18  Yes [provider]  Melatonin 5 MG TABS Take 10-15 mg by mouth at bedtime as needed (for sleep).   Yes [provider]  pantoprazole (PROTONIX) 40 MG tablet Take 1 tablet (40 mg total)  by mouth daily. 09/27/18  Yes Vann, Jessica U, DO  Risankizumab-rzaa (SKYRIZI PEN) 150 MG/ML SOAJ Inject 150 mg into the skin as directed. Every 12 weeks for maintenance. 03/09/21  Yes Lavonna Monarch, MD  clobetasol cream (TEMOVATE) 4.98 % Apply 1 application. topically 2 (two) times daily. Patient not taking: Reported on 12/20/2021 09/29/21   Lavonna Monarch, MD   Allergies  Allergen Reactions   Ativan [Lorazepam] Other (See Comments)    hallucinations   Phenergan [Promethazine Hcl] Hypertension    hallucinations   Review of Systems Denies chest pain denies shortness of breath Physical Exam Awake alert oriented Resting comfortably Regular work of breathing Y6-E1 Systolic murmur No edema No focal deficits  Vital Signs: BP (!) 133/109 (BP Location: Left Arm)   Pulse 60   Temp 98.2 F (36.8 C) (Oral)   Resp 18   Ht '5\' 8"'$  (1.727 m)   Wt 75 kg   SpO2 95%   BMI 25.14 kg/m  Pain Scale: 0-10   Pain Score: 0-No pain   SpO2: SpO2: 95 % O2 Device:SpO2: 95 % O2 Flow Rate: .   IO: Intake/output summary:  Intake/Output Summary (Last 24 hours) at 12/22/2021 1322 Last data filed at 12/22/2021 1316 Gross per 24 hour  Intake 443 ml  Output 200 ml  Net 243 ml    LBM: Last BM Date : 12/21/21 Baseline Weight: Weight: 75 kg Most recent weight: Weight: 75 kg     Palliative Assessment/Data:     Palliative performance scale 50%  Time In: 12.20 Time Out: 13.20 Time Total: 60 Greater than 50%  of  this time was spent counseling and coordinating care related to the above assessment and plan.  Signed by: Loistine Chance, MD   Please contact Palliative Medicine Team phone at [phone] for questions and concerns.  For individual provider: See Shea Evans

## 2021-12-23 DIAGNOSIS — I10 Essential (primary) hypertension: Secondary | ICD-10-CM | POA: Diagnosis not present

## 2021-12-23 DIAGNOSIS — F0390 Unspecified dementia without behavioral disturbance: Secondary | ICD-10-CM | POA: Diagnosis not present

## 2021-12-23 DIAGNOSIS — Z952 Presence of prosthetic heart valve: Secondary | ICD-10-CM | POA: Diagnosis not present

## 2021-12-23 DIAGNOSIS — R55 Syncope and collapse: Secondary | ICD-10-CM | POA: Diagnosis not present

## 2021-12-23 MED ORDER — SODIUM CHLORIDE 0.9 % IV SOLN: INTRAVENOUS | Status: DC

## 2021-12-23 NOTE — Progress Notes (Signed)
PT Cancellation Note  Patient Details Name: [name] MRN: [id number] DOB: [birth year]   Cancelled Treatment:    Reason Eval/Treat Not Completed: (P) Patient declined, no reason specified (Pt requesting the opportunity to sleep. Per RN pt has been up in recliner for the entire morning. Will check back as schedule and pt status allows which may be another day.)  Coolidge Breeze, PT, DPT [facility] Office: [phone] Pager: [phone]  Coolidge Breeze 12/23/2021, 12:33 PM

## 2021-12-23 NOTE — Progress Notes (Signed)
PROGRESS NOTE    Jhace Fennell  RCV:[id number] DOB: May 31, 1928 DOA: 12/19/2021 PCP: Lajean Manes, MD   Brief Narrative:  88 year old M with PMH of dementia, nonobstructive CAD, severe AS s/p TAVR, liver cirrhosis, RA, HTN, alcohol abuse, sinus bradycardia and BPH presented with lightheadedness, nausea, vomiting, diaphoresis and generalized weakness for 1 to 2 days and admitted for near syncope.  On presentation, he had sinus bradycardia to the 50s.  COVID-19 and influenza testing were negative.  CT head and chest x-ray was without acute finding.  TTE showed moderately increased gradient across TAVR.  Cardiology was consulted.  TEE scheduled for 12/24/2021.  Carotid ultrasound without significant finding.  Assessment & Plan:   Near syncope likely due to TAVR stenosis History of severe AS status post TAVR Sinus bradycardia -TTE with marked increase in gradient through AV compared to prior echocardiogram.  He also had sinus bradycardia but with chronotropic response to exertion.  He is not on nodal blocking agents or Aricept.  Carotid US without significant finding.  Orthostatic vitals negative but symptomatic from lying to sitting.  Slightly elevated TSH but normal free T4.  -TEE scheduled for 7/14. -TED hose -Fall precautions.  PT recommended home with PT.  Nausea and vomiting -Resolved.  Abdominal exam benign.  Tolerating p.o. intake.  LFT normal except for mild elevated bilirubin.  Lipase normal. S/p cholecystectomy in 2020. -Antiemetics as needed  Hyperbilirubinemia with indirect predominance -Has history of Gilbert's syndrome.   -Stable.   Dementia without behavioral disturbance -Not oriented to date and year -Reorientation and delirium precautions.  Goals of care -Palliative care following.  Currently remains full code.  Overall prognosis is guarded to poor.   Unknown liver cirrhosis -Likely alcoholic.  Daughter concerned about his wine consumption.  Followed by [facility]. -Encouraged alcohol cessation -Outpatient follow-up  Hyponatremia -Resolved  Thrombocytopenia -Questionable cause.  No signs of bleeding.  Rheumatoid arthritis:  Skyrizi injection every 12 weeks. -Outpatient follow-up   Essential hypertension: BP slightly elevated intermittently -Hold home losartan in the setting of orthostasis.   BPH -Continue home Avodart   DVT prophylaxis: Lovenox Code Status: Full Family Communication: None at bedside Disposition Plan: Status is: Inpatient Remains inpatient appropriate because: Of severity of illness.  Need for TEE  Consultants: Cardiology  Procedures: 2D echo  Antimicrobials: None   Subjective: Patient seen and examined at bedside.  Poor historian.  Hard of hearing.  No fever, vomiting or chest pain reported. Objective: Vitals:   12/22/21 0519 12/22/21 1316 12/22/21 1952 12/23/21 0439  BP: (!) 156/70 (!) 133/109 (!) 153/66 119/60  Pulse: (!) 51 60 61 (!) 52  Resp:  '18 20 17  '$ Temp: (!) 97.5 F (36.4 C) 98.2 F (36.8 C) 98.1 F (36.7 C) 97.7 F (36.5 C)  TempSrc: Oral Oral    SpO2: 96% 95% 97% 95%  Weight:      Height:        Intake/Output Summary (Last 24 hours) at 12/23/2021 0720 Last data filed at 12/23/2021 0442 Gross per 24 hour  Intake 663 ml  Output 200 ml  Net 463 ml    Filed Weights   12/19/21 2321  Weight: 75 kg    Examination:  General: On room air.  No distress. Sleepy, wakes up slightly, hard of hearing.  Poor historian. respiratory: Decreased breath sounds at bases bilaterally with some crackles CVS: Mild intermittent bradycardia present; S1-S2 heard  abdominal: Soft, nontender, slightly distended, no organomegaly; normal bowel sounds are heard  extremities: Trace  lower extremity edema; no clubbing.       Data Reviewed: I have personally reviewed following labs and imaging studies  CBC: Recent Labs  Lab 12/19/21 2326 12/21/21 0543  WBC 7.9 6.7  HGB 14.5 14.5  HCT 43.8 44.1  MCV  89.4 89.6  PLT 141* 143*    Basic Metabolic Panel: Recent Labs  Lab 12/19/21 2326 12/21/21 0543  NA 134* 138  K 3.8 4.1  CL 102 104  CO2 25 28  GLUCOSE 145* 107*  BUN 19 17  CREATININE 1.05 0.97  CALCIUM 8.6* 8.8*  MG  --  2.0  PHOS  --  3.7    GFR: Estimated Creatinine Clearance: 48 mL/min (by C-G formula based on SCr of 0.97 mg/dL). Liver Function Tests: Recent Labs  Lab 12/20/21 0010 12/21/21 0543  AST 29 28  ALT 36 33  ALKPHOS 55 52  BILITOT 1.8* 1.8*  PROT 7.0 6.5  ALBUMIN 4.0 3.6    Recent Labs  Lab 12/20/21 0010  LIPASE 38    No results for input(s): "AMMONIA" in the last 168 hours. Coagulation Profile: No results for input(s): "INR", "PROTIME" in the last 168 hours. Cardiac Enzymes: No results for input(s): "CKTOTAL", "CKMB", "CKMBINDEX", "TROPONINI" in the last 168 hours. BNP (last 3 results) No results for input(s): "PROBNP" in the last 8760 hours. HbA1C: No results for input(s): "HGBA1C" in the last 72 hours. CBG: Recent Labs  Lab 12/20/21 0013 12/20/21 1133 12/21/21 0508  GLUCAP 155* 128* 108*    Lipid Profile: No results for input(s): "CHOL", "HDL", "LDLCALC", "TRIG", "CHOLHDL", "LDLDIRECT" in the last 72 hours. Thyroid Function Tests: Recent Labs    12/21/21 0543  TSH 5.610*  FREET4 1.03    Anemia Panel: No results for input(s): "VITAMINB12", "FOLATE", "FERRITIN", "TIBC", "IRON", "RETICCTPCT" in the last 72 hours. Sepsis Labs: No results for input(s): "PROCALCITON", "LATICACIDVEN" in the last 168 hours.  Recent Results (from the past 240 hour(s))  Resp Panel by RT-PCR (Flu A&B, Covid) Anterior Nasal Swab     Status: None   Collection Time: 12/20/21 12:10 AM   Specimen: Anterior Nasal Swab  Result Value Ref Range Status   SARS Coronavirus 2 by RT PCR NEGATIVE NEGATIVE Final    Comment: (NOTE) SARS-CoV-2 target nucleic acids are NOT DETECTED.  The SARS-CoV-2 RNA is generally detectable in upper respiratory specimens  during the acute phase of infection. The lowest concentration of SARS-CoV-2 viral copies this assay can detect is 138 copies/mL. A negative result does not preclude SARS-Cov-2 infection and should not be used as the sole basis for treatment or other patient management decisions. A negative result may occur with  improper specimen collection/handling, submission of specimen other than nasopharyngeal swab, presence of viral mutation(s) within the areas targeted by this assay, and inadequate number of viral copies(<138 copies/mL). A negative result must be combined with clinical observations, patient history, and epidemiological information. The expected result is Negative.  Fact Sheet for Patients:  EntrepreneurPulse.com.au  Fact Sheet for Healthcare Providers:  IncredibleEmployment.be  This test is no t yet approved or cleared by the [place] FDA and  has been authorized for detection and/or diagnosis of SARS-CoV-2 by FDA under an Emergency Use Authorization (EUA). This EUA will remain  in effect (meaning this test can be used) for the duration of the COVID-19 declaration under Section 564(b)(1) of the Act, 21 U.S.C.section 360bbb-3(b)(1), unless the authorization is terminated  or revoked sooner.       Influenza A by  PCR NEGATIVE NEGATIVE Final   Influenza B by PCR NEGATIVE NEGATIVE Final    Comment: (NOTE) The Xpert Xpress SARS-CoV-2/FLU/RSV plus assay is intended as an aid in the diagnosis of influenza from Nasopharyngeal swab specimens and should not be used as a sole basis for treatment. Nasal washings and aspirates are unacceptable for Xpert Xpress SARS-CoV-2/FLU/RSV testing.  Fact Sheet for Patients: EntrepreneurPulse.com.au  Fact Sheet for Healthcare Providers: IncredibleEmployment.be  This test is not yet approved or cleared by the [place] FDA and has been authorized for detection  and/or diagnosis of SARS-CoV-2 by FDA under an Emergency Use Authorization (EUA). This EUA will remain in effect (meaning this test can be used) for the duration of the COVID-19 declaration under Section 564(b)(1) of the Act, 21 U.S.C. section 360bbb-3(b)(1), unless the authorization is terminated or revoked.  Performed at [facility], [place] [address], [place], [place] [address]          Radiology Studies: VAS US CAROTID  Result Date: 12/21/2021 Carotid Arterial Duplex Study Patient Name:  BRAVEN WOLK  Date of Exam:   12/21/2021 Medical Rec #: [id number]      Accession #:    [id number] Date of Birth: 13-Nov-1927       Patient Gender: M Patient Age:   45 years Exam Location:  [facility] Procedure:      VAS US CAROTID Referring Phys: Bretta Bang GONFA --------------------------------------------------------------------------------  Indications:       Syncope. Risk Factors:      Hypertension, hyperlipidemia. Comparison Study:  02/05/2018 Carotid artery duplex- right- no evidence of                    hemodynamically significant stenosis, left 1-39% ICA                    stenosis. Performing Technologist: Maudry Mayhew MHA, RDMS, RVT, RDCS  Examination Guidelines: A complete evaluation includes B-mode imaging, spectral Doppler, color Doppler, and power Doppler as needed of all accessible portions of each vessel. Bilateral testing is considered an integral part of a complete examination. Limited examinations for reoccurring indications may be performed as noted.  Right Carotid Findings: +----------+--------+--------+--------+------------------+------------------+           PSV cm/sEDV cm/sStenosisPlaque DescriptionComments           +----------+--------+--------+--------+------------------+------------------+ CCA Prox  102     12                                                   +----------+--------+--------+--------+------------------+------------------+ CCA  Distal74      8                                 intimal thickening +----------+--------+--------+--------+------------------+------------------+ ICA Prox  96      14                                                   +----------+--------+--------+--------+------------------+------------------+ ICA Distal104     17                                                   +----------+--------+--------+--------+------------------+------------------+  ECA       79                                                           +----------+--------+--------+--------+------------------+------------------+ +----------+--------+-------+----------------+-------------------+           PSV cm/sEDV cmsDescribe        Arm Pressure (mmHG) +----------+--------+-------+----------------+-------------------+ Subclavian170            Multiphasic, WNL                    +----------+--------+-------+----------------+-------------------+ +---------+--------+--+--------+--+---------+ VertebralPSV cm/s60EDV cm/s11Antegrade +---------+--------+--+--------+--+---------+  Left Carotid Findings: +----------+--------+--------+--------+-----------------------+--------+           PSV cm/sEDV cm/sStenosisPlaque Description     Comments +----------+--------+--------+--------+-----------------------+--------+ CCA Prox  140     13                                              +----------+--------+--------+--------+-----------------------+--------+ CCA Distal138     15              smooth and homogeneous          +----------+--------+--------+--------+-----------------------+--------+ ICA Prox  89      14              smooth and heterogenous         +----------+--------+--------+--------+-----------------------+--------+ ICA Distal100     16                                              +----------+--------+--------+--------+-----------------------+--------+ ECA       92                                                       +----------+--------+--------+--------+-----------------------+--------+ +----------+--------+--------+----------------+-------------------+           PSV cm/sEDV cm/sDescribe        Arm Pressure (mmHG) +----------+--------+--------+----------------+-------------------+ [id number]             Multiphasic, WNL                    +----------+--------+--------+----------------+-------------------+ +---------+--------+--+--------+-+---------+ VertebralPSV cm/s60EDV cm/s9Antegrade +---------+--------+--+--------+-+---------+   Summary: Right Carotid: The extracranial vessels were near-normal with only minimal wall                thickening or plaque. Left Carotid: Velocities in the left ICA are consistent with a 1-39% stenosis. Vertebrals:  Bilateral vertebral arteries demonstrate antegrade flow. Subclavians: Normal flow hemodynamics were seen in bilateral subclavian              arteries. No significant change when compared to prior study 02/05/2018. *See table(s) above for measurements and observations.  Electronically signed by Servando Snare MD on 12/21/2021 at 2:54:41 PM.    Final         Scheduled Meds:  aspirin  81 mg Oral Daily   dutasteride  0.5 mg Oral Daily   enoxaparin (LOVENOX) injection  40  mg Subcutaneous Q24H   escitalopram  20 mg Oral QHS   feeding supplement  237 mL Oral BID BM   multivitamin with minerals  1 tablet Oral Daily   pantoprazole  40 mg Oral Daily   sodium chloride flush  3 mL Intravenous Q12H   Continuous Infusions:        Aline August, MD [facility] 12/23/2021, 7:20 AM

## 2021-12-23 NOTE — Progress Notes (Signed)
   Plan for TEE tomorrow to evaluate TAVR valve - further recommendations to follow after.  Pixie Casino, MD, [facility], [place] Director of the Advanced Lipid Disorders &  Cardiovascular Risk Reduction Clinic Diplomate of the American Board of Clinical Lipidology Attending Cardiologist  Direct Dial: [phone]  Fax: [phone]  Website:  [email or web address]

## 2021-12-23 NOTE — Plan of Care (Signed)
  Problem: Education: Goal: Knowledge of General Education information will improve Description Including pain rating scale, medication(s)/side effects and non-pharmacologic comfort measures Outcome: Progressing   

## 2021-12-23 NOTE — Progress Notes (Signed)
PT Cancellation Note  Patient Details Name: [name] MRN: [id number] DOB: 12/18/1927   Cancelled Treatment:    Reason Eval/Treat Not Completed: Fatigue/lethargy limiting ability to participate (pt declined PT, he stated he wants to sleep right now. Will follow.) Philomena Doheny PT 12/23/2021  Acute Rehabilitation Services  Office [phone]

## 2021-12-23 NOTE — Progress Notes (Signed)
[facility] ([facility]) Hospital Liaison Note  Notified by TOC manager of patient/family request for [facility] palliative services at home after discharge.   [facility] hospital liaison will follow patient for discharge disposition.   Please call with any hospice or outpatient palliative care related questions.   Thank you for the opportunity to participate in this patient's care.   Shanita Wicker, LCSW [facility] Hospital Liaison [phone]  

## 2021-12-23 NOTE — TOC Transition Note (Signed)
Transition of Care [facility]) - CM/SW Discharge Note   Patient Details  Name: [name] MRN: [id number] Date of Birth: 06-22-1927  Transition of Care [facility]) CM/SW Contact:  Vassie Moselle, LCSW Phone Number: 12/23/2021, 11:27 AM   Clinical Narrative:    Spoke with pt's daughter [name] to discuss recommendations for Palliative Care to continue to follow pt outpatient. Pt's daughter is agreeable to this plan. Pt has been [facility] to [facility] for outpatient palliative care services.    Final next level of care: [address] Barriers to Discharge: Continued Medical Work up   Patient Goals and CMS Choice Patient states their goals for this hospitalization and ongoing recovery are:: To return home   Choice offered to / list presented to : Adult Children  Discharge Placement                       Discharge Plan and Services In-house Referral: Clinical Social Work Discharge Planning Services: CM Consult Post Acute Care Choice: Home Health          DME Arranged: N/A DME Agency: NA       HH Arranged: PT, OT HH Agency: [facility] Date HH Agency Contacted: 12/21/21 Time HH Agency Contacted: 74 Representative spoke with at [place]: [place] Determinants of Health ([place]) Interventions     Readmission Risk Interventions     No data to display

## 2021-12-24 ENCOUNTER — Other Ambulatory Visit: Payer: Self-pay

## 2021-12-24 ENCOUNTER — Inpatient Hospital Stay (HOSPITAL_COMMUNITY): Payer: Medicare Other

## 2021-12-24 ENCOUNTER — Inpatient Hospital Stay (HOSPITAL_COMMUNITY): Payer: Medicare Other | Admitting: Anesthesiology

## 2021-12-24 ENCOUNTER — Encounter (HOSPITAL_COMMUNITY): Payer: Self-pay | Admitting: Student

## 2021-12-24 ENCOUNTER — Encounter (HOSPITAL_COMMUNITY)
Admission: EM | Disposition: A | Discharge status: Home / Self Care | Payer: Self-pay | Source: Home / Self Care | Attending: Internal Medicine

## 2021-12-24 DIAGNOSIS — I509 Heart failure, unspecified: Secondary | ICD-10-CM

## 2021-12-24 DIAGNOSIS — I350 Nonrheumatic aortic (valve) stenosis: Secondary | ICD-10-CM

## 2021-12-24 DIAGNOSIS — F0390 Unspecified dementia without behavioral disturbance: Secondary | ICD-10-CM | POA: Diagnosis not present

## 2021-12-24 DIAGNOSIS — Z952 Presence of prosthetic heart valve: Secondary | ICD-10-CM

## 2021-12-24 DIAGNOSIS — Z87891 Personal history of nicotine dependence: Secondary | ICD-10-CM

## 2021-12-24 DIAGNOSIS — N400 Enlarged prostate without lower urinary tract symptoms: Secondary | ICD-10-CM | POA: Diagnosis not present

## 2021-12-24 DIAGNOSIS — I110 Hypertensive heart disease with heart failure: Secondary | ICD-10-CM

## 2021-12-24 DIAGNOSIS — R55 Syncope and collapse: Secondary | ICD-10-CM | POA: Diagnosis not present

## 2021-12-24 DIAGNOSIS — T82897A Other specified complication of cardiac prosthetic devices, implants and grafts, initial encounter: Secondary | ICD-10-CM

## 2021-12-24 DIAGNOSIS — I10 Essential (primary) hypertension: Secondary | ICD-10-CM | POA: Diagnosis not present

## 2021-12-24 HISTORY — PX: TEE WITHOUT CARDIOVERSION: SHX5443

## 2021-12-24 LAB — ECHO TEE
AR max vel: 1.04 cm2
AV Area VTI: 1.03 cm2
AV Area mean vel: 1.01 cm2
AV Mean grad: 60 mmHg
AV Peak grad: 92.9 mmHg
Ao pk vel: 4.82 m/s

## 2021-12-24 LAB — GLUCOSE, CAPILLARY: Glucose-Capillary: 129 mg/dL — ABNORMAL HIGH (ref 70–99)

## 2021-12-24 SURGERY — ECHOCARDIOGRAM, TRANSESOPHAGEAL
Anesthesia: Monitor Anesthesia Care

## 2021-12-24 MED ORDER — PROPOFOL 500 MG/50ML IV EMUL
INTRAVENOUS | Status: DC | PRN
  Administered 2021-12-24: 100 ug/kg/min via INTRAVENOUS

## 2021-12-24 MED ORDER — SODIUM CHLORIDE 0.9 % IV SOLN: INTRAVENOUS | Status: DC

## 2021-12-24 MED ORDER — IOHEXOL 9 MG/ML PO SOLN
500.0000 mL | ORAL | Status: AC
  Administered 2021-12-24: 500 mL via ORAL

## 2021-12-24 MED ORDER — PROPOFOL 10 MG/ML IV BOLUS
INTRAVENOUS | Status: DC | PRN
  Administered 2021-12-24 (×2): 10 mg via INTRAVENOUS

## 2021-12-24 MED ORDER — LIDOCAINE 2% (20 MG/ML) 5 ML SYRINGE
INTRAMUSCULAR | Status: DC | PRN
  Administered 2021-12-24: 100 mg via INTRAVENOUS

## 2021-12-24 MED ORDER — PHENYLEPHRINE 80 MCG/ML (10ML) SYRINGE FOR IV PUSH (FOR BLOOD PRESSURE SUPPORT)
PREFILLED_SYRINGE | INTRAVENOUS | Status: DC | PRN
  Administered 2021-12-24: 80 ug via INTRAVENOUS

## 2021-12-24 NOTE — Progress Notes (Addendum)
Dr. Debara Pickett is planning to look at the image later. Case discussed with Dr. Ali Lowe of structural heart clinic. Patient likely can be evaluated by [facility] as outpatient. There is concerning of patient-valve mismatch and increased gradient across valve. Dr. Ali Lowe likely will repeat TAVR CT to rule out HALT/HAM/thrombus.   Scheduled outpatient TAVR CT on 7/21 at 1:30PM.

## 2021-12-24 NOTE — Progress Notes (Signed)
PT Cancellation Note  Patient Details Name: Bassam Dresch MRN: [id number] DOB: 1928-03-18   Cancelled Treatment:    Reason Eval/Treat Not Completed: Patient at procedure or test/unavailable   Kati L Payson 12/24/2021, 9:36 AM Arlyce Dice, DPT Physical Therapist Acute Rehabilitation Services Preferred contact method: Secure Chat Weekend Pager Only: [phone] Office: [phone]

## 2021-12-24 NOTE — Anesthesia Preprocedure Evaluation (Signed)
Anesthesia Evaluation  Patient identified by MRN, date of birth, ID band Patient awake    Reviewed: Allergy & Precautions, NPO status , Patient's Chart, lab work & pertinent test results  Airway Mallampati: II  TM Distance: >3 FB Neck ROM: Full    Dental no notable dental hx.    Pulmonary neg pulmonary ROS, former smoker,    Pulmonary exam normal breath sounds clear to auscultation       Cardiovascular hypertension, Pt. on medications +CHF  Normal cardiovascular exam Rhythm:Regular Rate:Normal  IMPRESSIONS Left ventricular ejection fraction, by estimation, is 70 to 75%. The left ventricle has hyperdynamic function. The left ventricle has no regional wall motion abnormalities. There is mild left ventricular hypertrophy. Left ventricular diastolic parameters are indeterminate. 1. Right ventricular systolic function is normal. The right ventricular size is normal. There is normal pulmonary artery systolic pressure. The estimated right ventricular systolic pressure is 32.4 mmHg. 2. The mitral valve is normal in structure. No evidence of mitral valve regurgitation. No evidence of mitral stenosis. 3. The inferior vena cava is normal in size with greater than 50% respiratory variability, suggesting right atrial pressure of 3 mmHg. 4. There is a 23 mm Edwards Sapien prosthetic (TAVR) valve present in the aortic position. Mild perivalvular leak. Vmax 4.5 m/s, MG 45 mmHg, EOA 1.5 cm^2, DI 0.38, AT 23m. Marked increase in gradients through AV compared to prior echo (141mg on 05/18/21). AT is low, and has increased LVOT gradient, suggesting high flow state contributing to elevated AV gradients, though not enough to explain his MG 4519m. Prosthetic valve is not well visualized, would recommend TEE to better assess   Neuro/Psych PSYCHIATRIC DISORDERS Depression Dementia Hard of hearing    GI/Hepatic Neg liver ROS, GERD  ,   Endo/Other  negative endocrine ROS  Renal/GU Renal InsufficiencyRenal disease  negative genitourinary   Musculoskeletal  (+) Arthritis , Rheumatoid disorders,    Abdominal   Peds negative pediatric ROS (+)  Hematology negative hematology ROS (+)   Anesthesia Other Findings   Reproductive/Obstetrics (+) Breast feeding                            Anesthesia Physical Anesthesia Plan  ASA: 3  Anesthesia Plan: MAC   Post-op Pain Management:    Induction: Intravenous  PONV Risk Score and Plan: 1 and Propofol infusion, TIVA and Treatment may vary due to age or medical condition  Airway Management Planned:   Additional Equipment: None  Intra-op Plan:   Post-operative Plan:   Informed Consent: I have reviewed the patients History and Physical, chart, labs and discussed the procedure including the risks, benefits and alternatives for the proposed anesthesia with the patient or authorized representative who has indicated his/her understanding and acceptance.     Interpreter used for intAT&Tscussed with: CRNA, Anesthesiologist and Surgeon  Anesthesia Plan Comments:        Anesthesia Quick Evaluation

## 2021-12-24 NOTE — Transfer of Care (Signed)
Immediate Anesthesia Transfer of Care Note  Patient: [name]  Procedure(s) Performed: TRANSESOPHAGEAL ECHOCARDIOGRAM (TEE)  Patient Location: Endoscopy Unit  Anesthesia Type:MAC  Level of Consciousness: awake and drowsy  Airway & Oxygen Therapy: Patient Spontanous Breathing and Patient connected to nasal cannula oxygen  Post-op Assessment: Report given to RN and Post -op Vital signs reviewed and stable  Post vital signs: Reviewed and stable  Last Vitals:  Vitals Value Taken Time  BP 130/95 12/24/21 1018  Temp 36.5 C 12/24/21 1018  Pulse 62 12/24/21 1020  Resp 17 12/24/21 1020  SpO2 95 % 12/24/21 1020  Vitals shown include unvalidated device data.  Last Pain:  Vitals:   12/24/21 1018  TempSrc: Temporal  PainSc:          Complications: No notable events documented.

## 2021-12-24 NOTE — Plan of Care (Signed)

## 2021-12-24 NOTE — CV Procedure (Signed)
INDICATIONS: Abnormal TAVR prosthesis  PROCEDURE:   Informed consent was obtained prior to the procedure. The risks, benefits and alternatives for the procedure were discussed and the patient comprehended these risks.  Risks include, but are not limited to, cough, sore throat, vomiting, nausea, somnolence, esophageal and stomach trauma or perforation, bleeding, low blood pressure, aspiration, pneumonia, infection, trauma to the teeth and death.    After a procedural time-out, the oropharynx was anesthetized with 20% benzocaine spray.   During this procedure the patient was administered propofol to achieve and maintain moderate conscious sedation.  The patient's heart rate, blood pressure, and oxygen saturationweare monitored continuously during the procedure. The period of conscious sedation was 20 minutes, of which I was present face-to-face 100% of this time.  The transesophageal probe was inserted in the esophagus and stomach without difficulty and multiple views were obtained.  The patient was kept under observation until the patient left the procedure room.  The patient left the procedure room in stable condition.   Agitated microbubble saline contrast was not administered.  COMPLICATIONS:    There were no immediate complications.  FINDINGS:  Normal LV/RV function Concern for PPM (DI 0.25, EOAi 0.5, normal AT < 100 ms)  RECOMMENDATIONS:     Discuss with structural team vs. palliative  Time Spent Directly with the Patient:  20 minutes   Janina Mayo 12/24/2021, 10:21 AM

## 2021-12-24 NOTE — Progress Notes (Signed)
PROGRESS NOTE    [name]  KWI:[id number] DOB: 22-Nov-1927 DOA: 12/19/2021 PCP: Lajean Manes, MD   Brief Narrative:  88 year old M with PMH of dementia, nonobstructive CAD, severe AS s/p TAVR, liver cirrhosis, RA, HTN, alcohol abuse, sinus bradycardia and BPH presented with lightheadedness, nausea, vomiting, diaphoresis and generalized weakness for 1 to 2 days and admitted for near syncope.  On presentation, he had sinus bradycardia to the 50s.  COVID-19 and influenza testing were negative.  CT head and chest x-ray was without acute finding.  TTE showed moderately increased gradient across TAVR.  Cardiology was consulted.  TEE scheduled for today.  Carotid ultrasound without significant finding.  Assessment & Plan:   Near syncope likely due to TAVR stenosis History of severe AS status post TAVR Sinus bradycardia -TTE with marked increase in gradient through AV compared to prior echocardiogram.  He also had sinus bradycardia but with chronotropic response to exertion.  He is not on nodal blocking agents or Aricept.  Carotid US without significant finding.  Orthostatic vitals negative but symptomatic from lying to sitting.  Slightly elevated TSH but normal free T4.  -TEE scheduled for today. -TED hose -Fall precautions.  PT recommended home with PT.  Nausea and vomiting -Resolved.  Abdominal exam benign.  Tolerating p.o. intake.  LFT normal except for mild elevated bilirubin.  Lipase normal. S/p cholecystectomy in 2020. -Antiemetics as needed  Hyperbilirubinemia with indirect predominance -Has history of Gilbert's syndrome.   -Stable.   Dementia without behavioral disturbance -Not oriented to date and year -Reorientation and delirium precautions.  Goals of care -Palliative care following.  Currently remains full code.  Overall prognosis is guarded to poor.   Unknown liver cirrhosis -Likely alcoholic.  Daughter concerned about his wine consumption.  Followed by [facility]. -Encouraged alcohol cessation -Outpatient follow-up  Hyponatremia -Resolved  Thrombocytopenia -Questionable cause.  No signs of bleeding.  Rheumatoid arthritis:  Skyrizi injection every 12 weeks. -Outpatient follow-up   Essential hypertension: BP slightly elevated intermittently -Hold home losartan in the setting of orthostasis.   BPH -Continue home Avodart   DVT prophylaxis: Lovenox Code Status: Full Family Communication: None at bedside Disposition Plan: Status is: Inpatient Remains inpatient appropriate because: Of severity of illness.  Need for TEE  Consultants: Cardiology  Procedures: 2D echo  Antimicrobials: None   Subjective: Patient seen and examined at bedside.  Poor historian.  Hard of hearing.  No chest pain, shortness of breath, agitation or fever reported  objective: Vitals:   12/24/21 1023 12/24/21 1031 12/24/21 1038 12/24/21 1119  BP: (!) 124/52 (!) 131/48 (!) 143/52 (!) 159/61  Pulse: (!) 53 (!) 49 (!) 49 (!) 50  Resp: '17 17 17 16  '$ Temp:    97.9 F (36.6 C)  TempSrc:    Oral  SpO2: 95% 95% 96% 98%  Weight:      Height:        Intake/Output Summary (Last 24 hours) at 12/24/2021 1135 Last data filed at 12/24/2021 1020 Gross per 24 hour  Intake 660 ml  Output 225 ml  Net 435 ml    Filed Weights   12/19/21 2321 12/24/21 0630  Weight: 75 kg 74.2 kg    Examination:  General: No acute distress.  Still on room air.  Sleepy, wakes up slightly, hard of hearing.  Extremely poor historian.   Respiratory: Bilateral decreased breath sounds at bases with some scattered crackles  CVS: S1 and S2 are heard; still bradycardic intermittently abdominal: Soft, nontender, distended mildly;  no organomegaly; bowel sounds are heard normally  extremities: No cyanosis; mild lower extremity edema present   Data Reviewed: I have personally reviewed following labs and imaging studies  CBC: Recent Labs  Lab 12/19/21 2326 12/21/21 0543  WBC 7.9 6.7   HGB 14.5 14.5  HCT 43.8 44.1  MCV 89.4 89.6  PLT 141* 143*    Basic Metabolic Panel: Recent Labs  Lab 12/19/21 2326 12/21/21 0543  NA 134* 138  K 3.8 4.1  CL 102 104  CO2 25 28  GLUCOSE 145* 107*  BUN 19 17  CREATININE 1.05 0.97  CALCIUM 8.6* 8.8*  MG  --  2.0  PHOS  --  3.7    GFR: Estimated Creatinine Clearance: 48 mL/min (by C-G formula based on SCr of 0.97 mg/dL). Liver Function Tests: Recent Labs  Lab 12/20/21 0010 12/21/21 0543  AST 29 28  ALT 36 33  ALKPHOS 55 52  BILITOT 1.8* 1.8*  PROT 7.0 6.5  ALBUMIN 4.0 3.6    Recent Labs  Lab 12/20/21 0010  LIPASE 38    No results for input(s): "AMMONIA" in the last 168 hours. Coagulation Profile: No results for input(s): "INR", "PROTIME" in the last 168 hours. Cardiac Enzymes: No results for input(s): "CKTOTAL", "CKMB", "CKMBINDEX", "TROPONINI" in the last 168 hours. BNP (last 3 results) No results for input(s): "PROBNP" in the last 8760 hours. HbA1C: No results for input(s): "HGBA1C" in the last 72 hours. CBG: Recent Labs  Lab 12/20/21 0013 12/20/21 1133 12/21/21 0508 12/24/21 0706  GLUCAP 155* 128* 108* 129*    Lipid Profile: No results for input(s): "CHOL", "HDL", "LDLCALC", "TRIG", "CHOLHDL", "LDLDIRECT" in the last 72 hours. Thyroid Function Tests: No results for input(s): "TSH", "T4TOTAL", "FREET4", "T3FREE", "THYROIDAB" in the last 72 hours.  Anemia Panel: No results for input(s): "VITAMINB12", "FOLATE", "FERRITIN", "TIBC", "IRON", "RETICCTPCT" in the last 72 hours. Sepsis Labs: No results for input(s): "PROCALCITON", "LATICACIDVEN" in the last 168 hours.  Recent Results (from the past 240 hour(s))  Resp Panel by RT-PCR (Flu A&B, Covid) Anterior Nasal Swab     Status: None   Collection Time: 12/20/21 12:10 AM   Specimen: Anterior Nasal Swab  Result Value Ref Range Status   SARS Coronavirus 2 by RT PCR NEGATIVE NEGATIVE Final    Comment: (NOTE) SARS-CoV-2 target nucleic acids are  NOT DETECTED.  The SARS-CoV-2 RNA is generally detectable in upper respiratory specimens during the acute phase of infection. The lowest concentration of SARS-CoV-2 viral copies this assay can detect is 138 copies/mL. A negative result does not preclude SARS-Cov-2 infection and should not be used as the sole basis for treatment or other patient management decisions. A negative result may occur with  improper specimen collection/handling, submission of specimen other than nasopharyngeal swab, presence of viral mutation(s) within the areas targeted by this assay, and inadequate number of viral copies(<138 copies/mL). A negative result must be combined with clinical observations, patient history, and epidemiological information. The expected result is Negative.  Fact Sheet for Patients:  EntrepreneurPulse.com.au  Fact Sheet for Healthcare Providers:  IncredibleEmployment.be  This test is no t yet approved or cleared by the [place] FDA and  has been authorized for detection and/or diagnosis of SARS-CoV-2 by FDA under an Emergency Use Authorization (EUA). This EUA will remain  in effect (meaning this test can be used) for the duration of the COVID-19 declaration under Section 564(b)(1) of the Act, 21 U.S.C.section 360bbb-3(b)(1), unless the authorization is terminated  or revoked sooner.  Influenza A by PCR NEGATIVE NEGATIVE Final   Influenza B by PCR NEGATIVE NEGATIVE Final    Comment: (NOTE) The Xpert Xpress SARS-CoV-2/FLU/RSV plus assay is intended as an aid in the diagnosis of influenza from Nasopharyngeal swab specimens and should not be used as a sole basis for treatment. Nasal washings and aspirates are unacceptable for Xpert Xpress SARS-CoV-2/FLU/RSV testing.  Fact Sheet for Patients: EntrepreneurPulse.com.au  Fact Sheet for Healthcare Providers: IncredibleEmployment.be  This test is not  yet approved or cleared by the [place] FDA and has been authorized for detection and/or diagnosis of SARS-CoV-2 by FDA under an Emergency Use Authorization (EUA). This EUA will remain in effect (meaning this test can be used) for the duration of the COVID-19 declaration under Section 564(b)(1) of the Act, 21 U.S.C. section 360bbb-3(b)(1), unless the authorization is terminated or revoked.  Performed at [facility], [place] [address] [place], [place] [address]          Radiology Studies: No results found.      Scheduled Meds:  aspirin  81 mg Oral Daily   dutasteride  0.5 mg Oral Daily   enoxaparin (LOVENOX) injection  40 mg Subcutaneous Q24H   escitalopram  20 mg Oral QHS   feeding supplement  237 mL Oral BID BM   multivitamin with minerals  1 tablet Oral Daily   pantoprazole  40 mg Oral Daily   sodium chloride flush  3 mL Intravenous Q12H   Continuous Infusions:        Aline August, MD [facility] 12/24/2021, 11:35 AM

## 2021-12-24 NOTE — Anesthesia Procedure Notes (Signed)
Procedure Name: MAC Date/Time: 12/24/2021 9:52 AM  Performed by: Dorann Lodge, CRNAPre-anesthesia Checklist: Emergency Drugs available, Patient identified, Suction available and Patient being monitored Patient Re-evaluated:Patient Re-evaluated prior to induction Oxygen Delivery Method: Nasal cannula Induction Type: IV induction Airway Equipment and Method: Bite block Dental Injury: Teeth and Oropharynx as per pre-operative assessment

## 2021-12-24 NOTE — Progress Notes (Signed)
Daughter, Claris Che, called stating that the patient is vomiting and that she is still awaiting an update from cardiology on plan of care. I assessed patient with an interpreter Mickel Baas [phone]) and he denies vomiting. Dr Aline August and PA Octaviano Batty notified of same and Elizabeth states he will call and update daughter.

## 2021-12-24 NOTE — Anesthesia Postprocedure Evaluation (Signed)
Anesthesia Post Note  Patient: [name]  Procedure(s) Performed: TRANSESOPHAGEAL ECHOCARDIOGRAM (TEE)     Patient location during evaluation: PACU Anesthesia Type: MAC Level of consciousness: awake and alert Pain management: pain level controlled Vital Signs Assessment: post-procedure vital signs reviewed and stable Respiratory status: spontaneous breathing and respiratory function stable Cardiovascular status: stable Postop Assessment: no apparent nausea or vomiting Anesthetic complications: no   No notable events documented.  Last Vitals:  Vitals:   12/24/21 1038 12/24/21 1119  BP: (!) 143/52 (!) 159/61  Pulse: (!) 49 (!) 50  Resp: 17 16  Temp:  36.6 C  SpO2: 96% 98%    Last Pain:  Vitals:   12/24/21 1119  TempSrc: Oral  PainSc:                  [name]

## 2021-12-24 NOTE — Progress Notes (Signed)
  Echocardiogram Echocardiogram Transesophageal has been performed.  Darlina Sicilian M 12/24/2021, 10:54 AM

## 2021-12-25 ENCOUNTER — Inpatient Hospital Stay (HOSPITAL_COMMUNITY): Payer: Medicare Other

## 2021-12-25 DIAGNOSIS — N400 Enlarged prostate without lower urinary tract symptoms: Secondary | ICD-10-CM | POA: Diagnosis not present

## 2021-12-25 DIAGNOSIS — I10 Essential (primary) hypertension: Secondary | ICD-10-CM | POA: Diagnosis not present

## 2021-12-25 DIAGNOSIS — I639 Cerebral infarction, unspecified: Secondary | ICD-10-CM | POA: Diagnosis not present

## 2021-12-25 DIAGNOSIS — R55 Syncope and collapse: Secondary | ICD-10-CM | POA: Diagnosis not present

## 2021-12-25 LAB — GLUCOSE, CAPILLARY: Glucose-Capillary: 105 mg/dL — ABNORMAL HIGH (ref 70–99)

## 2021-12-25 LAB — COMPREHENSIVE METABOLIC PANEL
ALT: 31 U/L (ref 0–44)
AST: 26 U/L (ref 15–41)
Albumin: 3.4 g/dL — ABNORMAL LOW (ref 3.5–5.0)
Alkaline Phosphatase: 51 U/L (ref 38–126)
Anion gap: 6 (ref 5–15)
BUN: 24 mg/dL — ABNORMAL HIGH (ref 8–23)
CO2: 28 mmol/L (ref 22–32)
Calcium: 8.9 mg/dL (ref 8.9–10.3)
Chloride: 104 mmol/L (ref 98–111)
Creatinine, Ser: 1.07 mg/dL (ref 0.61–1.24)
GFR, Estimated: >60 mL/min (ref >60)
Glucose, Bld: 122 mg/dL — ABNORMAL HIGH (ref 70–99)
Potassium: 4.4 mmol/L (ref 3.5–5.1)
Sodium: 138 mmol/L (ref 135–145)
Total Bilirubin: 1.2 mg/dL (ref 0.3–1.2)
Total Protein: 6.2 g/dL — ABNORMAL LOW (ref 6.5–8.1)

## 2021-12-25 LAB — CBC WITH DIFFERENTIAL/PLATELET
Abs Immature Granulocytes: 0.02 10*3/uL (ref 0.00–0.07)
Basophils Absolute: 0 10*3/uL (ref 0.0–0.1)
Basophils Relative: 1 %
Eosinophils Absolute: 0.3 10*3/uL (ref 0.0–0.5)
Eosinophils Relative: 5 %
HCT: 42.4 % (ref 39.0–52.0)
Hemoglobin: 13.7 g/dL (ref 13.0–17.0)
Immature Granulocytes: 0 %
Lymphocytes Relative: 25 %
Lymphs Abs: 1.5 10*3/uL (ref 0.7–4.0)
MCH: 29.1 pg (ref 26.0–34.0)
MCHC: 32.3 g/dL (ref 30.0–36.0)
MCV: 90 fL (ref 80.0–100.0)
Monocytes Absolute: 0.4 10*3/uL (ref 0.1–1.0)
Monocytes Relative: 7 %
Neutro Abs: 3.8 10*3/uL (ref 1.7–7.7)
Neutrophils Relative %: 62 %
Platelets: 145 10*3/uL — ABNORMAL LOW (ref 150–400)
RBC: 4.71 MIL/uL (ref 4.22–5.81)
RDW: 12.2 % (ref 11.5–15.5)
WBC: 6.1 10*3/uL (ref 4.0–10.5)
nRBC: 0 % (ref 0.0–0.2)

## 2021-12-25 LAB — LIPID PANEL
Cholesterol: 161 mg/dL (ref 0–200)
HDL: 57 mg/dL (ref >40)
LDL Cholesterol: 92 mg/dL (ref 0–99)
Total CHOL/HDL Ratio: 2.8 RATIO
Triglycerides: 61 mg/dL (ref <150)
VLDL: 12 mg/dL (ref 0–40)

## 2021-12-25 LAB — HEMOGLOBIN A1C
Hgb A1c MFr Bld: 6 % — ABNORMAL HIGH (ref 4.8–5.6)
Mean Plasma Glucose: 125.5 mg/dL

## 2021-12-25 LAB — MAGNESIUM: Magnesium: 2.1 mg/dL (ref 1.7–2.4)

## 2021-12-25 MED ORDER — ONDANSETRON HCL 4 MG PO TABS: 4.0000 mg | ORAL_TABLET | Freq: Three times a day (TID) | ORAL | 0 refills | Status: AC | PRN

## 2021-12-25 MED ORDER — POLYETHYLENE GLYCOL 3350 17 G PO PACK: 17.0000 g | PACK | Freq: Every day | ORAL | 0 refills | Status: AC | PRN

## 2021-12-25 MED ORDER — SENNOSIDES-DOCUSATE SODIUM 8.6-50 MG PO TABS
1.0000 | ORAL_TABLET | Freq: Two times a day (BID) | ORAL | Status: AC
Start: 2021-12-25 — End: ?

## 2021-12-25 MED ORDER — ADULT MULTIVITAMIN W/MINERALS CH: 1.0000 | ORAL_TABLET | Freq: Every day | ORAL | Status: AC

## 2021-12-25 NOTE — Progress Notes (Signed)
MRI head completed and reported normal. Recommendations as in the consult note Spoke with the daughter and updated her over the phone Discussed the plan with Dr. Bonney Roussel over secure chat.  -- Amie Portland, MD Neurologist [facility] Pager: [phone]

## 2021-12-25 NOTE — Consult Note (Signed)
Neurology Consultation  Reason for Consult: Stroke found on MRI Referring Physician: Dr. Starla Link  CC: Weakness nausea vomiting  History is obtained from: Chart  HPI: [name] is a 88 y.o. male presented to the emergency room on 12/20/2021 with complaints of nausea vomiting.  He has a past medical history of CAD, severe arctic stenosis status post TAVR, hypertension, HLD, rheumatoid and dementia who came in after an episode of near syncope.  He was accompanied by his daughters who give the history to the admitted.  He lives alone with 1 daughter living few doors down.  He is able to ambulate and perform ADLs at baseline but is now more fatigued and requiring more assistance getting back home after he went for a walk.  He also complained of lightheadedness and nausea.  No chest pain shortness of breath. In ED he was saturating in the mid 90s on room air, EKG sinus rhythm.  Chest x-ray negative for acute cardiopulmonary process.  Head CT negative for acute intracranial process. He was fluid resuscitated, given Zofran, and admitted for evaluation of near syncope. Cardiology consulted-TEE performed to look at the TAVR valve.  Marked increase in gradient of the aortic valve compared to prior echocardiogram.  Also sinus bradycardia.  Orthostatic vitals negative. MRI brain was done upon request of the family and that showed a punctate acute/subacute infarct in the left parietal lobe.  Moderate chronic white matter disease and moderate parenchymal volume loss with medial temporal lobe predominance. No history of atrial fibrillation  LKW: Unclear-prior to presentation IV thrombolysis given?: no, outside the window Premorbid modified Rankin scale (mRS):3   ROS: Full ROS was performed and is negative except as noted in the HPI.  Past Medical History:  Diagnosis Date   BPH (benign prostatic hypertrophy)    Depression    Diverticulosis of colon    GERD (gastroesophageal reflux disease)    Gilbert's  syndrome    History of kidney stones    HLD (hyperlipidemia)    HOH (hard of hearing)    refuses to wear his Hearing Aid   Hypertension    Inguinal hernia    right   Internal hemorrhoid    Normal coronary arteries    a. Cath 07/2010: normal coronaries. Felt to have noncardiac CP/SOB at that time possibly r/t anxiety.   RA (rheumatoid arthritis) (HCC)    bilateral hands/ wrist--  seronegative   S/P TAVR (transcatheter aortic valve replacement) 03/27/2018   23 mm Edwards Sapien 3 transcatheter heart valve placed via percutaneous right transfemoral approach    Severe aortic stenosis    Thyroid nodule    noted 11-2009   Wears dentures      Family History  Problem Relation Age of Onset   Pulmonary embolism Mother 62       caused by complications of surgery   CVA Father    Diabetes Brother    Diabetes Brother    Breast cancer Sister    Social History:   reports that he quit smoking about 40 years ago. His smoking use included cigarettes. He started smoking about 48 years ago. He has never used smokeless tobacco. He reports current alcohol use. He reports that he does not use drugs.  Medications  Current Facility-Administered Medications:    acetaminophen (TYLENOL) tablet 650 mg, 650 mg, Oral, Q6H PRN **OR** acetaminophen (TYLENOL) suppository 650 mg, 650 mg, Rectal, Q6H PRN, Opyd, Ilene Qua, MD   aspirin chewable tablet 81 mg, 81 mg, Oral, Daily, Opyd,  Ilene Qua, MD, 81 mg at 12/25/21 0834   dutasteride (AVODART) capsule 0.5 mg, 0.5 mg, Oral, Daily, Opyd, Ilene Qua, MD, 0.5 mg at 12/25/21 0857   enoxaparin (LOVENOX) injection 40 mg, 40 mg, Subcutaneous, Q24H, Opyd, Ilene Qua, MD, 40 mg at 12/25/21 0835   escitalopram (LEXAPRO) tablet 20 mg, 20 mg, Oral, QHS, Opyd, Ilene Qua, MD, 20 mg at 12/24/21 2051   feeding supplement (ENSURE ENLIVE / ENSURE PLUS) liquid 237 mL, 237 mL, Oral, BID BM, Alekh, Kshitiz, MD, 237 mL at 12/25/21 0834   melatonin tablet 10-15 mg, 10-15 mg, Oral, QHS  PRN, Opyd, Ilene Qua, MD, 15 mg at 12/24/21 2051   multivitamin with minerals tablet 1 tablet, 1 tablet, Oral, Daily, Cyndia Skeeters, Taye T, MD, 1 tablet at 12/24/21 1230   ondansetron (ZOFRAN) tablet 4 mg, 4 mg, Oral, Q6H PRN **OR** ondansetron (ZOFRAN) injection 4 mg, 4 mg, Intravenous, Q6H PRN, Opyd, Ilene Qua, MD, 4 mg at 12/24/21 1440   Oral care mouth rinse, 15 mL, Mouth Rinse, PRN, Cyndia Skeeters, Taye T, MD   pantoprazole (PROTONIX) EC tablet 40 mg, 40 mg, Oral, Daily, Opyd, Ilene Qua, MD, 40 mg at 12/24/21 1229   senna-docusate (Senokot-S) tablet 1 tablet, 1 tablet, Oral, QHS PRN, Opyd, Ilene Qua, MD, 1 tablet at 12/23/21 1811   sodium chloride flush (NS) 0.9 % injection 3 mL, 3 mL, Intravenous, Q12H, Opyd, Ilene Qua, MD, 3 mL at 12/24/21 2052   Exam: Current vital signs: BP 139/63 (BP Location: Right Arm)   Pulse (!) 53   Temp 98.3 F (36.8 C) (Oral)   Resp 16   Ht '5\' 8"'$  (1.727 m)   Wt 77.5 kg   SpO2 95%   BMI 25.98 kg/m  Vital signs in last 24 hours: Temp:  [97.5 F (36.4 C)-98.3 F (36.8 C)] 98.3 F (36.8 C) (07/15 0308) Pulse Rate:  [53-56] 53 (07/15 0308) Resp:  [16-18] 16 (07/15 0308) BP: (139-155)/(52-63) 139/63 (07/15 0308) SpO2:  [95 %-98 %] 95 % (07/15 0308) Weight:  [77.5 kg] 77.5 kg (07/15 0422) General: Awake alert oriented to self HEENT: Normocephalic atraumatic Lungs: Clear Cardiovascular regular rhythm Neurological exam He is awake alert oriented to self He is aware that he is in the hospital He was able to tell me his age at 89 years. Was not able to tell me the month or date. He has no dysarthria Given the constraints of English being his second language, he did not have difficulty naming simple objects.  He was also able to repeat simple sentences but had difficulty repeating complex and longer sentences. Diminished attention concentration Cranial nerves II to XII intact Motor examination with no drift in any of the 4 extremities Sensation intact to light  touch Coordination exam with no dysmetria NIHSS 1a Level of Conscious.: 0 1b LOC Questions: 2 1c LOC Commands: 0 2 Best Gaze: 0 3 Visual: 0 4 Facial Palsy: 0 5a Motor Arm - left: 0 5b Motor Arm - Right: 0 6a Motor Leg - Left: 0 6b Motor Leg - Right: 0 7 Limb Ataxia: 0 8 Sensory: 0 9 Best Language: 0 10 Dysarthria: 0 11 Extinct. and Inatten.: 0 TOTAL: 2   Labs I have reviewed labs in epic and the results pertinent to this consultation are:  CBC    Component Value Date/Time   WBC 6.1 12/25/2021 0539   RBC 4.71 12/25/2021 0539   HGB 13.7 12/25/2021 0539   HGB 11.3 (L) 04/03/2018 1632   HCT  42.4 12/25/2021 0539   HCT 33.6 (L) 04/03/2018 1632   PLT 145 (L) 12/25/2021 0539   PLT 150 04/03/2018 1632   MCV 90.0 12/25/2021 0539   MCV 89 04/03/2018 1632   MCH 29.1 12/25/2021 0539   MCHC 32.3 12/25/2021 0539   RDW 12.2 12/25/2021 0539   RDW 12.5 04/03/2018 1632   LYMPHSABS 1.5 12/25/2021 0539   LYMPHSABS 1.3 04/03/2018 1632   MONOABS 0.4 12/25/2021 0539   EOSABS 0.3 12/25/2021 0539   EOSABS 0.3 04/03/2018 1632   BASOSABS 0.0 12/25/2021 0539   BASOSABS 0.0 04/03/2018 1632    CMP     Component Value Date/Time   NA 138 12/25/2021 0539   K 4.4 12/25/2021 0539   CL 104 12/25/2021 0539   CO2 28 12/25/2021 0539   GLUCOSE 122 (H) 12/25/2021 0539   BUN 24 (H) 12/25/2021 0539   CREATININE 1.07 12/25/2021 0539   CALCIUM 8.9 12/25/2021 0539   PROT 6.2 (L) 12/25/2021 0539   ALBUMIN 3.4 (L) 12/25/2021 0539   AST 26 12/25/2021 0539   ALT 31 12/25/2021 0539   ALKPHOS 51 12/25/2021 0539   BILITOT 1.2 12/25/2021 0539   GFRNONAA >60 12/25/2021 0539   GFRAA >60 10/05/2019 0837    Lipid Panel     Component Value Date/Time   CHOL 161 12/25/2021 0539   TRIG 61 12/25/2021 0539   HDL 57 12/25/2021 0539   CHOLHDL 2.8 12/25/2021 0539   VLDL 12 12/25/2021 0539   LDLCALC 92 12/25/2021 0539  LDL 92 A1c-pending  2D echo IMPRESSIONS   1. Left ventricular ejection fraction,  by estimation, is 60 to 65%. The  left ventricle has normal function.   2. Right ventricular systolic function is normal. The right ventricular  size is normal.   3. No left atrial/left atrial appendage thrombus was detected.   4. The mitral valve is normal in structure. No evidence of mitral valve  regurgitation.   5. Mild-moderate paravalvular leak 0.19 cm. Well seated, no dehiscence.  No thrombus. V max 4.8 m/s. mean gradient 60 mmhg. DI 0.25, AT < 100 ms,  EOAi 0.5 cm/m2, concerning for prosthesis-patient mismatch. Aortic valve  regurgitation is not visualized.  There is a 23 mm Sapien prosthetic (TAVR) valve present in the aortic  position. Procedure Date: 03/27/2018.   Imaging I have reviewed the images obtained:  CT head no acute changes MRI brain  showed a punctate acute/subacute infarct in the left parietal lobe.  Moderate chronic white matter disease and moderate parenchymal volume loss with medial temporal lobe predominance. Carotid Dopplers with no significant stenosis. MRI head pending  Assessment:  88 year old with above past medical history admitted for evaluation of weakness and near syncope likely due to TAVR stenosis found on TEE, also noted to have a punctate stroke in the left parietal lobe on the MRI of the brain that was done to complete work-up. I think this stroke is merely incidental and has no bearing on his current presentation which is primarily due to his aortic stenosis.  Other differentials for punctate cortical strokes is cardioembolic processes. That said, his stroke work-up is nearly completed. My recommendation from a stroke prevention perspective or below  Recommendations: Continue aspirin  I would recommend cardiology consider long-term cardiac monitoring to evaluate for A-fib.  If A-fib is found, he might then need anticoagulation.  Prevention. MRA head to complete the work-up High intensity statin with goal LDL less than 70 A1c pending-goal less  than 7 PT OT  speech therapy Follow-up in the stroke clinic in 4 to 6-week Eliquis for neurology Plan was discussed with Dr. Starla Link  -- Amie Portland, MD Neurologist [facility] Pager: [phone]

## 2021-12-25 NOTE — Discharge Summary (Signed)
Physician Discharge Summary  Traven Davids KGY:[id number] DOB: 10/01/1927 DOA: 12/19/2021  PCP: Lajean Manes, MD  Admit date: 12/19/2021 Discharge date: 12/25/2021  Admitted From: Home Disposition: Home  Recommendations for Outpatient Follow-up:  Follow up with PCP in 1 week with repeat CBC/CMP Outpatient follow-up with cardiology/neurology/GI/palliative care  follow up in ED if symptoms worsen or new appear   Home Health: Home with PT/OT Equipment/Devices: None  Discharge Condition: Guarded CODE STATUS: Full Diet recommendation: Heart healthy  Brief/Interim Summary: 88 year old M with PMH of dementia, nonobstructive CAD, severe AS s/p TAVR, liver cirrhosis, RA, HTN, alcohol abuse, sinus bradycardia and BPH presented with lightheadedness, nausea, vomiting, diaphoresis and generalized weakness for 1 to 2 days and admitted for near syncope.  On presentation, he had sinus bradycardia to the 50s.  COVID-19 and influenza testing were negative.  CT head and chest x-ray was without acute finding.  TTE showed moderately increased gradient across TAVR.  Cardiology was consulted.  Carotid ultrasound without significant finding.  TEE completed on 12/24/2021 and cardiology recommended outpatient follow-up with cardiology/structural heart clinic and outpatient follow-up with TAVR CT.  CT of abdomen and pelvis and MRI of brain was ordered for family's concern for intermittent nausea and initial dizziness and near syncope.  MRI of brain showed acute/subacute infarct in the left parietal lobe.  Neurology felt that this was probably an incidental finding.  MRA head was done.  Neurology recommended to continue aspirin and outpatient follow-up with neurology.  He will be discharged home today.  Discharge Diagnoses:   Near syncope likely due to TAVR stenosis History of severe AS status post TAVR Sinus bradycardia -TTE with marked increase in gradient through AV compared to prior echocardiogram.  He also had  sinus bradycardia but with chronotropic response to exertion.  He is not on nodal blocking agents or Aricept.  Carotid US without significant finding.  Orthostatic vitals negative but symptomatic from lying to sitting.  Slightly elevated TSH but normal free T4.  -TEE completed on 12/24/2021 and cardiology recommended outpatient follow-up with cardiology/structural heart clinic and outpatient follow-up with TAVR CT. -Fall precautions.  PT recommended home with PT.   Acute/subacute infarct in the left parietal lobe -Family was concerned regarding patient's intermittent nausea and initial dizziness and near syncope.  After speaking to family on phone on 12/24/2021, MRI brain performed on 12/24/2021 showed findings as above.  Patient is already on aspirin 81 mg daily.  LDL 92.  Patient apparently had issues with statin in the past with elevated LFTs.  Would defer statin initiation to the PCP.  Goal LDL should be below 70. -Neurology felt that this was probably an incidental finding.  MRA head was done.  Neurology recommended to continue aspirin and outpatient follow-up with neurology.  -Rest of the work-up for acute stroke including PT/OT/echo have already been completed.  Hemoglobin A1c pending. -Discharge patient home today.  Nausea and vomiting -Resolved.  Abdominal exam benign.  Tolerating p.o. intake.  LFT normal except for mild elevated bilirubin.  Lipase normal. S/p cholecystectomy in 2020. -Antiemetics as needed -CT of the abdomen and pelvis did not show any acute abnormality except for constipation including no ascites   Hyperbilirubinemia with indirect predominance -Has history of Gilbert's syndrome.   -Stable.   Dementia without behavioral disturbance -Not oriented to date and year -Reorientation and delirium precautions.   Goals of care -Palliative care following.  Currently remains full code.  Overall prognosis is guarded to poor.  Outpatient follow-up with palliative care.  Unknown  liver cirrhosis -Likely alcoholic.  Daughter concerned about his wine consumption.  Followed by [facility].  CT of abdomen and pelvis as above. -Encouraged alcohol cessation -Outpatient follow-up   Hyponatremia -Resolved   Thrombocytopenia -Questionable cause.  No signs of bleeding.   Rheumatoid arthritis:  Skyrizi injection every 12 weeks. -Outpatient follow-up   Essential hypertension: BP slightly elevated intermittently -Hold home losartan in the setting of orthostasis.   BPH -Continue home Avodart    Discharge Instructions   Allergies as of 12/25/2021       Reactions   Ativan [lorazepam] Other (See Comments)   hallucinations   Phenergan [promethazine Hcl] Hypertension   hallucinations        Medication List     TAKE these medications    aspirin 81 MG tablet Take 1 tablet (81 mg total) by mouth daily.   clobetasol cream 0.05 % Commonly known as: TEMOVATE Apply 1 application. topically 2 (two) times daily.   dutasteride 0.5 MG capsule Commonly known as: AVODART Take 0.5 mg by mouth daily.   escitalopram 20 MG tablet Commonly known as: LEXAPRO Take 1 tablet (20 mg total) by mouth daily. What changed: when to take this   losartan 25 MG tablet Commonly known as: COZAAR Take 25 mg by mouth daily.   melatonin 5 MG Tabs Take 10-15 mg by mouth at bedtime as needed (for sleep).   multivitamin with minerals Tabs tablet Take 1 tablet by mouth daily. Start taking on: December 26, 2021   ondansetron 4 MG tablet Commonly known as: Zofran Take 1 tablet (4 mg total) by mouth every 8 (eight) hours as needed for nausea or vomiting.   pantoprazole 40 MG tablet Commonly known as: PROTONIX Take 1 tablet (40 mg total) by mouth daily.   polyethylene glycol 17 g packet Commonly known as: MiraLax Take 17 g by mouth daily as needed for moderate constipation.   senna-docusate 8.6-50 MG tablet Commonly known as: Senokot-S Take 1 tablet by mouth 2 (two) times daily.    Skyrizi Pen 150 MG/ML Soaj Generic drug: Risankizumab-rzaa Inject 150 mg into the skin as directed. Every 12 weeks for maintenance.         Follow-up Information     [facility] Follow up on 12/31/2021.   Why: 1:00PM. Friday, December 31, 2021. Please check-in at [facility] at 1:00 PM. [address] Arts development officer).  You have a CT scheduled for 1:30PM. Contact information: [place] [address] [phone]        Jerline Pain, MD Follow up.   Specialty: Cardiology Why: office scheduler will contact you to arrange follow up visit with general cardiologist, if you do not hear from our scheduler in 3 bussiness days, please give [place] a call. Contact information: [address] [place] [address] [phone]                Allergies  Allergen Reactions   Ativan [Lorazepam] Other (See Comments)    hallucinations   Phenergan [Promethazine Hcl] Hypertension    hallucinations    Consultations: Cardiology/neurology/palliative care   Procedures/Studies: CT ABDOMEN PELVIS WO CONTRAST  Result Date: 12/24/2021 CLINICAL DATA:  Abdominal pain EXAM: CT ABDOMEN AND PELVIS WITHOUT CONTRAST TECHNIQUE: Multidetector CT imaging of the abdomen and pelvis was performed following the standard protocol without IV contrast. RADIATION DOSE REDUCTION: This exam was performed according to the departmental dose-optimization  program which includes automated exposure control, adjustment of the mA and/or kV according to patient size and/or use of iterative reconstruction technique. COMPARISON:  CT done on 06/04/2018 FINDINGS: Lower chest: There is prosthesis in aortic valve. In image 15 of series 4, 5 mm nodule is seen in right lower lobe. This finding has not changed since 06/04/2018. There is no focal pulmonary infiltrate in the lower lung fields. Hepatobiliary: Few  calcifications are seen in the liver. There is air in the bile duct branches. This may be due to previous sphincterotomy. Surgical clips are seen in gallbladder fossa. Pancreas: No focal abnormalities are seen. Spleen: Unremarkable. Adrenals/Urinary Tract: Adrenals are unremarkable. There is no hydronephrosis. There are few small calcific densities in both kidneys each measuring less than 3 mm suggesting small bilateral renal stones. There are few small marginated low-density lesions in both kidneys largest measuring 3.8 cm in the anterior midportion of right kidney. Some of the smaller low-density lesions could not be satisfactorily characterized. Ureters are not dilated. Urinary bladder is unremarkable. Stomach/Bowel: Stomach is not distended. Small bowel loops are not dilated. The appendix is not dilated. There is no significant wall thickening in colon. Multiple diverticula are seen in the colon without signs of focal acute diverticulitis. Moderate to large amount of stool is seen in rectum. Transverse diameter of the rectum measures 7 cm. There is no perirectal stranding. Vascular/Lymphatic: Scattered arterial calcifications are seen. Reproductive: Prostate is enlarged. Other: There is no ascites or pneumoperitoneum. There are pockets of air in subcutaneous plane in the anterior abdominal wall, possibly suggesting parenteral administration of medication. Less likely possibility would be infectious process. There is no loculated fluid collection in the anterior abdominal wall. Musculoskeletal: No acute findings are seen. IMPRESSION: There is no evidence of intestinal obstruction or pneumoperitoneum. There is no hydronephrosis. Appendix is not dilated. Diverticulosis of colon without signs of focal diverticulitis. Enlarged prostate. There are few tiny bilateral renal stones. Bilateral renal cysts. Other findings as described in the body of the report. Electronically Signed   By: Elmer Picker M.D.   On:  12/24/2021 18:54   MR BRAIN WO CONTRAST  Result Date: 12/24/2021 CLINICAL DATA:  Delirium. EXAM: MRI HEAD WITHOUT CONTRAST TECHNIQUE: Multiplanar, multiecho pulse sequences of the brain and surrounding structures were obtained without intravenous contrast. COMPARISON:  Head CT July FINDINGS: Brain: Punctate cortical focus of restricted diffusion in the left parietal lobe consistent with acute/subacute infarct. No hemorrhage, hydrocephalus, extra-axial collection or mass lesion. Scattered and confluent foci of T2 hyperintensity are seen within the white matter of the cerebral hemispheres, nonspecific, most likely related to chronic small vessel ischemia. Moderate parenchymal volume loss with mesial temporal lobe predominance. Vascular: Normal flow voids. Skull and upper cervical spine: Normal marrow signal. Sinuses/Orbits: Negative. Other: None. IMPRESSION: 1. Punctate cortical acute/subacute infarct in the left parietal lobe. 2. Moderate chronic white matter disease. 3. Moderate parenchymal volume loss with medial temporal lobe predominance. Electronically Signed   By: Pedro Earls M.D.   On: 12/24/2021 18:34   ECHO TEE  Result Date: 12/24/2021    TRANSESOPHOGEAL ECHO REPORT   Patient Name:   RANARD HARTE Date of Exam: 12/24/2021 Medical Rec #:  [id number]     Height:       68.0 in Accession #:    [id number]    Weight:       163.6 lb Date of Birth:  21-Dec-1927      BSA:  1.876 m Patient Age:    47 years      BP:           133/51 mmHg Patient Gender: M             HR:           61 bpm. Exam Location:  Inpatient Procedure: Transesophageal Echo, Cardiac Doppler, Color Doppler and 3D Echo Indications:    Aortic Stenosis  History:        Patient has prior history of Echocardiogram examinations, most                 recent 12/20/2021. Risk Factors:Hypertension and Dyslipidemia.                 GERD.                 Aortic Valve: 23 mm Sapien prosthetic, stented (TAVR) valve is                  present in the aortic position. Procedure Date: 03/27/2018.  Sonographer:    Darlina Sicilian RDCS Referring Phys: [id number] Darreld Mclean  Sonographer Comments: DI .25 PROCEDURE: After discussion of the risks and benefits of a TEE, an informed consent was obtained from the patient. TEE procedure time was 14 minutes. The transesophogeal probe was passed without difficulty through the esophogus of the patient. Imaged were obtained with the patient in a left lateral decubitus position. Sedation performed by different physician. The patient was monitored while under deep sedation. Anesthestetic sedation was provided intravenously by Anesthesiology: 166.'92mg'$  of Propofol, '100mg'$  of Lidocaine. Image quality was good. The patient's vital signs; including heart rate, blood pressure, and oxygen saturation; remained stable throughout the procedure. The patient developed no complications during the procedure. IMPRESSIONS  1. Left ventricular ejection fraction, by estimation, is 60 to 65%. The left ventricle has normal function.  2. Right ventricular systolic function is normal. The right ventricular size is normal.  3. No left atrial/left atrial appendage thrombus was detected.  4. The mitral valve is normal in structure. No evidence of mitral valve regurgitation.  5. Mild-moderate paravalvular leak 0.19 cm. Well seated, no dehiscence. No thrombus. V max 4.8 m/s. mean gradient 60 mmhg. DI 0.25, AT < 100 ms, EOAi 0.5 cm/m2, concerning for prosthesis-patient mismatch. Aortic valve regurgitation is not visualized. There is a 23 mm Sapien prosthetic (TAVR) valve present in the aortic position. Procedure Date: 03/27/2018. Conclusion(s)/Recommendation(s): Discussed with primary cardiologist. FINDINGS  Left Ventricle: Left ventricular ejection fraction, by estimation, is 60 to 65%. The left ventricle has normal function. The left ventricular internal cavity size was normal in size. Right Ventricle: The right ventricular size is  normal. Right ventricular systolic function is normal. Left Atrium: Left atrial size was normal in size. No left atrial/left atrial appendage thrombus was detected. Right Atrium: Right atrial size was normal in size. Pericardium: There is no evidence of pericardial effusion. Mitral Valve: The mitral valve is normal in structure. No evidence of mitral valve regurgitation. Tricuspid Valve: The tricuspid valve is normal in structure. Tricuspid valve regurgitation is not demonstrated. Aortic Valve: Mild-moderate paravalvular leak 0.19 cm. Well seated, no dehiscence. No thrombus. V max 4.8 m/s. mean gradient 60 mmhg. DI 0.25, AT < 100 ms, EOAi 0.5 cm/m2, concerning for prosthesis-patient mismatch. Aortic valve regurgitation is not visualized. Aortic valve mean gradient measures 60.0 mmHg. Aortic valve peak gradient measures 92.9 mmHg. Aortic valve area, by VTI measures 1.03 cm. There is a  23 mm Sapien prosthetic, stented (TAVR) valve present in the aortic position. Procedure Date: 03/27/2018. Pulmonic Valve: The pulmonic valve was not well visualized. Pulmonic valve regurgitation is not visualized. IAS/Shunts: No atrial level shunt detected by color flow Doppler.  LEFT VENTRICLE PLAX 2D LVOT diam:     2.30 cm LV SV:         136 LV SV Index:   72 LVOT Area:     4.15 cm  AORTIC VALVE AV Area (Vmax):    1.04 cm AV Area (Vmean):   1.01 cm AV Area (VTI):     1.03 cm AV Vmax:           482.00 cm/s AV Vmean:          368.000 cm/s AV VTI:            1.320 m AV Peak Grad:      92.9 mmHg AV Mean Grad:      60.0 mmHg LVOT Vmax:         121.00 cm/s LVOT Vmean:        89.700 cm/s LVOT VTI:          0.327 m LVOT/AV VTI ratio: 0.25  SHUNTS Systemic VTI:  0.33 m Systemic Diam: 2.30 cm Phineas Inches Electronically signed by Phineas Inches Signature Date/Time: 12/24/2021/12:32:56 PM    Final    VAS US CAROTID  Result Date: 12/21/2021 Carotid Arterial Duplex Study Patient Name:  NASIER THUMM  Date of Exam:   12/21/2021 Medical Rec #:  [id number]      Accession #:    [id number] Date of Birth: 06/12/1928       Patient Gender: M Patient Age:   48 years Exam Location:  [facility] Procedure:      VAS US CAROTID Referring Phys: Bretta Bang GONFA --------------------------------------------------------------------------------  Indications:       Syncope. Risk Factors:      Hypertension, hyperlipidemia. Comparison Study:  02/05/2018 Carotid artery duplex- right- no evidence of                    hemodynamically significant stenosis, left 1-39% ICA                    stenosis. Performing Technologist: Maudry Mayhew MHA, RDMS, RVT, RDCS  Examination Guidelines: A complete evaluation includes B-mode imaging, spectral Doppler, color Doppler, and power Doppler as needed of all accessible portions of each vessel. Bilateral testing is considered an integral part of a complete examination. Limited examinations for reoccurring indications may be performed as noted.  Right Carotid Findings: +----------+--------+--------+--------+------------------+------------------+           PSV cm/sEDV cm/sStenosisPlaque DescriptionComments           +----------+--------+--------+--------+------------------+------------------+ CCA Prox  102     12                                                   +----------+--------+--------+--------+------------------+------------------+ CCA Distal74      8                                 intimal thickening +----------+--------+--------+--------+------------------+------------------+ ICA Prox  96      14                                                   +----------+--------+--------+--------+------------------+------------------+  ICA Distal104     17                                                   +----------+--------+--------+--------+------------------+------------------+ ECA       79                                                            +----------+--------+--------+--------+------------------+------------------+ +----------+--------+-------+----------------+-------------------+           PSV cm/sEDV cmsDescribe        Arm Pressure (mmHG) +----------+--------+-------+----------------+-------------------+ Subclavian170            Multiphasic, WNL                    +----------+--------+-------+----------------+-------------------+ +---------+--------+--+--------+--+---------+ VertebralPSV cm/s60EDV cm/s11Antegrade +---------+--------+--+--------+--+---------+  Left Carotid Findings: +----------+--------+--------+--------+-----------------------+--------+           PSV cm/sEDV cm/sStenosisPlaque Description     Comments +----------+--------+--------+--------+-----------------------+--------+ CCA Prox  140     13                                              +----------+--------+--------+--------+-----------------------+--------+ CCA Distal138     15              smooth and homogeneous          +----------+--------+--------+--------+-----------------------+--------+ ICA Prox  89      14              smooth and heterogenous         +----------+--------+--------+--------+-----------------------+--------+ ICA Distal100     16                                              +----------+--------+--------+--------+-----------------------+--------+ ECA       92                                                      +----------+--------+--------+--------+-----------------------+--------+ +----------+--------+--------+----------------+-------------------+           PSV cm/sEDV cm/sDescribe        Arm Pressure (mmHG) +----------+--------+--------+----------------+-------------------+ [id number]             Multiphasic, WNL                    +----------+--------+--------+----------------+-------------------+ +---------+--------+--+--------+-+---------+ VertebralPSV cm/s60EDV  cm/s9Antegrade +---------+--------+--+--------+-+---------+   Summary: Right Carotid: The extracranial vessels were near-normal with only minimal wall                thickening or plaque. Left Carotid: Velocities in the left ICA are consistent with a 1-39% stenosis. Vertebrals:  Bilateral vertebral arteries demonstrate antegrade flow. Subclavians: Normal flow hemodynamics were seen in bilateral subclavian              arteries. No significant change when compared  to prior study 02/05/2018. *See table(s) above for measurements and observations.  Electronically signed by Servando Snare MD on 12/21/2021 at 2:54:41 PM.    Final    ECHOCARDIOGRAM COMPLETE  Result Date: 12/20/2021    ECHOCARDIOGRAM REPORT   Patient Name:   JERAMYAH GOODPASTURE Date of Exam: 12/20/2021 Medical Rec #:  [id number]     Height:       68.0 in Accession #:    [id number]    Weight:       165.3 lb Date of Birth:  03/21/1928      BSA:          1.885 m Patient Age:    80 years      BP:           157/60 mmHg Patient Gender: M             HR:           53 bpm. Exam Location:  Inpatient Procedure: 2D Echo, Cardiac Doppler, Color Doppler and Strain Analysis Indications:     R55 Syncope  History:         Patient has prior history of Echocardiogram examinations, most                  recent 05/18/2021. CHF, Abnormal ECG, Aortic Valve Disease,                  Arrythmias:Bradycardia, Signs/Symptoms:Altered Mental Status,                  Alzheimer's, Syncope and Chest Pain; Risk Factors:Hypertension,                  Dyslipidemia and Former Smoker. Severe aortic stenosis.                  Aortic Valve: 23 mm Edwards Sapien prosthetic, stented (TAVR)                  valve is present in the aortic position.  Sonographer:     Roseanna Rainbow RDCS Referring Phys:  [id number] Ilene Qua OPYD Diagnosing Phys: Oswaldo Milian MD  Sonographer Comments: Global longitudinal strain was attempted. IMPRESSIONS  1. Left ventricular ejection fraction, by estimation, is 70 to 75%.  The left ventricle has hyperdynamic function. The left ventricle has no regional wall motion abnormalities. There is mild left ventricular hypertrophy. Left ventricular diastolic parameters are indeterminate.  2. Right ventricular systolic function is normal. The right ventricular size is normal. There is normal pulmonary artery systolic pressure. The estimated right ventricular systolic pressure is 37.1 mmHg.  3. The mitral valve is normal in structure. No evidence of mitral valve regurgitation. No evidence of mitral stenosis.  4. The inferior vena cava is normal in size with greater than 50% respiratory variability, suggesting right atrial pressure of 3 mmHg.  5. There is a 23 mm Edwards Sapien prosthetic (TAVR) valve present in the aortic position. Mild perivalvular leak. Vmax 4.5 m/s, MG 45 mmHg, EOA 1.5 cm^2, DI 0.38, AT 40m. Marked increase in gradients through AV compared to prior echo (138mg on 05/18/21). AT is low, and has increased LVOT gradient, suggesting high flow state contributing to elevated AV gradients, though not enough to explain his MG 4556m. Prosthetic valve is not well visualized, would recommend TEE to better assess etiology of increased gradients FINDINGS  Left Ventricle: Left ventricular ejection fraction, by estimation, is 70 to 75%. The left ventricle has hyperdynamic function. The left ventricle  has no regional wall motion abnormalities. The left ventricular internal cavity size was normal in size. There is mild left ventricular hypertrophy. Left ventricular diastolic parameters are indeterminate. Right Ventricle: The right ventricular size is normal. No increase in right ventricular wall thickness. Right ventricular systolic function is normal. There is normal pulmonary artery systolic pressure. The tricuspid regurgitant velocity is 2.33 m/s, and  with an assumed right atrial pressure of 3 mmHg, the estimated right ventricular systolic pressure is 59.1 mmHg. Left Atrium: Left atrial  size was normal in size. Right Atrium: Right atrial size was normal in size. Pericardium: Trivial pericardial effusion is present. Mitral Valve: The mitral valve is normal in structure. No evidence of mitral valve regurgitation. No evidence of mitral valve stenosis. MV peak gradient, 8.8 mmHg. The mean mitral valve gradient is 2.0 mmHg. Tricuspid Valve: The tricuspid valve is normal in structure. Tricuspid valve regurgitation is trivial. Aortic Valve: The aortic valve has been repaired/replaced. Aortic valve regurgitation is mild. Aortic regurgitation PHT measures 769 msec. Aortic valve mean gradient measures 39.1 mmHg. Aortic valve peak gradient measures 72.0 mmHg. Aortic valve area, by  VTI measures 1.66 cm. There is a 23 mm Edwards Sapien prosthetic, stented (TAVR) valve present in the aortic position. Pulmonic Valve: The pulmonic valve was not well visualized. Pulmonic valve regurgitation is not visualized. Aorta: The aortic root and ascending aorta are structurally normal, with no evidence of dilitation. Venous: The inferior vena cava is normal in size with greater than 50% respiratory variability, suggesting right atrial pressure of 3 mmHg. IAS/Shunts: The interatrial septum was not well visualized.  LEFT VENTRICLE PLAX 2D LVIDd:         4.20 cm     Diastology LVIDs:         2.60 cm     LV e' medial:    5.33 cm/s LV PW:         1.00 cm     LV E/e' medial:  15.4 LV IVS:        1.10 cm     LV e' lateral:   5.98 cm/s LVOT diam:     2.30 cm     LV E/e' lateral: 13.7 LV SV:         161 LV SV Index:   85 LVOT Area:     4.15 cm  LV Volumes (MOD) LV vol d, MOD A4C: 77.1 ml LV vol s, MOD A4C: 19.6 ml LV SV MOD A4C:     77.1 ml RIGHT VENTRICLE             IVC RV S prime:     15.70 cm/s  IVC diam: 1.70 cm TAPSE (M-mode): 2.1 cm LEFT ATRIUM             Index        RIGHT ATRIUM           Index LA diam:        2.80 cm 1.49 cm/m   RA Area:     10.40 cm LA Vol (A2C):   35.1 ml 18.62 ml/m  RA Volume:   18.70 ml  9.92  ml/m LA Vol (A4C):   28.6 ml 15.17 ml/m LA Biplane Vol: 32.0 ml 16.98 ml/m  AORTIC VALVE AV Area (Vmax):    1.65 cm AV Area (Vmean):   1.61 cm AV Area (VTI):     1.66 cm AV Vmax:           424.12 cm/s AV Vmean:  288.487 cm/s AV VTI:            0.968 m AV Peak Grad:      72.0 mmHg AV Mean Grad:      39.1 mmHg LVOT Vmax:         168.00 cm/s LVOT Vmean:        112.000 cm/s LVOT VTI:          0.387 m LVOT/AV VTI ratio: 0.40 AI PHT:            769 msec  AORTA Ao Root diam: 2.70 cm Ao Asc diam:  3.60 cm MITRAL VALVE                TRICUSPID VALVE MV Area (PHT): 2.16 cm     TR Peak grad:   21.7 mmHg MV Area VTI:   3.01 cm     TR Vmax:        233.00 cm/s MV Peak grad:  8.8 mmHg MV Mean grad:  2.0 mmHg     SHUNTS MV Vmax:       1.48 m/s     Systemic VTI:  0.39 m MV Vmean:      63.6 cm/s    Systemic Diam: 2.30 cm MV Decel Time: 352 msec MV E velocity: 82.00 cm/s MV A velocity: 144.00 cm/s MV E/A ratio:  0.57 Oswaldo Milian MD Electronically signed by Oswaldo Milian MD Signature Date/Time: 12/20/2021/3:58:26 PM    Final (Updated)    CT Head Wo Contrast  Result Date: 12/20/2021 CLINICAL DATA:  Weakness near syncopal episode EXAM: CT HEAD WITHOUT CONTRAST TECHNIQUE: Contiguous axial images were obtained from the base of the skull through the vertex without intravenous contrast. RADIATION DOSE REDUCTION: This exam was performed according to the departmental dose-optimization program which includes automated exposure control, adjustment of the mA and/or kV according to patient size and/or use of iterative reconstruction technique. COMPARISON:  CT brain 10/04/2019 FINDINGS: Brain: No acute territorial infarction, hemorrhage or intracranial mass. Atrophy and chronic small vessel ischemic changes of the white matter. Chronic appearing lacunar infarcts within the bilateral basal ganglia. Stable ventricle size. Vascular: No hyperdense vessels.  Carotid vascular calcification Skull: Normal. Negative for  fracture or focal lesion. Sinuses/Orbits: No acute finding. Other: None IMPRESSION: 1. No CT evidence for acute intracranial abnormality. 2. Atrophy and chronic small vessel ischemic changes of the white matter Electronically Signed   By: Donavan Foil M.D.   On: 12/20/2021 00:47   DG Chest 2 View  Result Date: 12/20/2021 CLINICAL DATA:  Weakness EXAM: CHEST - 2 VIEW COMPARISON:  09/14/2021 FINDINGS: Cardiac shadow is within normal limits. Lungs are well aerated bilaterally. No focal infiltrate or effusion is seen. Degenerative changes of the thoracic spine are noted. Changes of prior TAVR are seen. IMPRESSION: No active cardiopulmonary disease. Electronically Signed   By: Inez Catalina M.D.   On: 12/20/2021 00:43      Subjective: Patient seen and examined at bedside. Poor historian.  Hard of hearing.  No agitation, fever, seizures or vomiting reported.    Discharge Exam: Vitals:   12/25/21 0308 12/25/21 1255  BP: 139/63 137/73  Pulse: (!) 53 63  Resp: 16 18  Temp: 98.3 F (36.8 C) 97.6 F (36.4 C)  SpO2: 95% 96%   General: On room air.  No distress.  Awake, poor historian.  Hard of hearing.   Respiratory: Decreased breath sounds at bases bilaterally with scattered crackles CVS: Mildly bradycardic intermittently; S1 and S2 are heard  abdominal: Soft, nontender, still distended mildly; no organomegaly; normal bowel sounds  extremities: Trace lower extremity edema present; no clubbing      The results of significant diagnostics from this hospitalization (including imaging, microbiology, ancillary and laboratory) are listed below for reference.     Microbiology: Recent Results (from the past 240 hour(s))  Resp Panel by RT-PCR (Flu A&B, Covid) Anterior Nasal Swab     Status: None   Collection Time: 12/20/21 12:10 AM   Specimen: Anterior Nasal Swab  Result Value Ref Range Status   SARS Coronavirus 2 by RT PCR NEGATIVE NEGATIVE Final    Comment: (NOTE) SARS-CoV-2 target nucleic  acids are NOT DETECTED.  The SARS-CoV-2 RNA is generally detectable in upper respiratory specimens during the acute phase of infection. The lowest concentration of SARS-CoV-2 viral copies this assay can detect is 138 copies/mL. A negative result does not preclude SARS-Cov-2 infection and should not be used as the sole basis for treatment or other patient management decisions. A negative result may occur with  improper specimen collection/handling, submission of specimen other than nasopharyngeal swab, presence of viral mutation(s) within the areas targeted by this assay, and inadequate number of viral copies(<138 copies/mL). A negative result must be combined with clinical observations, patient history, and epidemiological information. The expected result is Negative.  Fact Sheet for Patients:  EntrepreneurPulse.com.au  Fact Sheet for Healthcare Providers:  IncredibleEmployment.be  This test is no t yet approved or cleared by the [place] FDA and  has been authorized for detection and/or diagnosis of SARS-CoV-2 by FDA under an Emergency Use Authorization (EUA). This EUA will remain  in effect (meaning this test can be used) for the duration of the COVID-19 declaration under Section 564(b)(1) of the Act, 21 U.S.C.section 360bbb-3(b)(1), unless the authorization is terminated  or revoked sooner.       Influenza A by PCR NEGATIVE NEGATIVE Final   Influenza B by PCR NEGATIVE NEGATIVE Final    Comment: (NOTE) The Xpert Xpress SARS-CoV-2/FLU/RSV plus assay is intended as an aid in the diagnosis of influenza from Nasopharyngeal swab specimens and should not be used as a sole basis for treatment. Nasal washings and aspirates are unacceptable for Xpert Xpress SARS-CoV-2/FLU/RSV testing.  Fact Sheet for Patients: EntrepreneurPulse.com.au  Fact Sheet for Healthcare Providers: IncredibleEmployment.be  This  test is not yet approved or cleared by the [place] FDA and has been authorized for detection and/or diagnosis of SARS-CoV-2 by FDA under an Emergency Use Authorization (EUA). This EUA will remain in effect (meaning this test can be used) for the duration of the COVID-19 declaration under Section 564(b)(1) of the Act, 21 U.S.C. section 360bbb-3(b)(1), unless the authorization is terminated or revoked.  Performed at [facility], [place] [address], [place], [place] [address]      Labs: BNP (last 3 results) No results for input(s): "BNP" in the last 8760 hours. Basic Metabolic Panel: Recent Labs  Lab 12/19/21 2326 12/21/21 0543 12/25/21 0539  NA 134* 138 138  K 3.8 4.1 4.4  CL 102 104 104  CO2 '25 28 28  '$ GLUCOSE 145* 107* 122*  BUN 19 17 24*  CREATININE 1.05 0.97 1.07  CALCIUM 8.6* 8.8* 8.9  MG  --  2.0 2.1  PHOS  --  3.7  --    Liver Function Tests: Recent Labs  Lab 12/20/21 0010 12/21/21 0543 12/25/21 0539  AST '29 28 26  '$ ALT 36 33 31  ALKPHOS 55 52 51  BILITOT 1.8* 1.8*  1.2  PROT 7.0 6.5 6.2*  ALBUMIN 4.0 3.6 3.4*   Recent Labs  Lab 12/20/21 0010  LIPASE 38   No results for input(s): "AMMONIA" in the last 168 hours. CBC: Recent Labs  Lab 12/19/21 2326 12/21/21 0543 12/25/21 0539  WBC 7.9 6.7 6.1  NEUTROABS  --   --  3.8  HGB 14.5 14.5 13.7  HCT 43.8 44.1 42.4  MCV 89.4 89.6 90.0  PLT 141* 143* 145*   Cardiac Enzymes: No results for input(s): "CKTOTAL", "CKMB", "CKMBINDEX", "TROPONINI" in the last 168 hours. BNP: Invalid input(s): "POCBNP" CBG: Recent Labs  Lab 12/20/21 0013 12/20/21 1133 12/21/21 0508 12/24/21 0706 12/25/21 0415  GLUCAP 155* 128* 108* 129* 105*   D-Dimer No results for input(s): "DDIMER" in the last 72 hours. Hgb A1c No results for input(s): "HGBA1C" in the last 72 hours. Lipid Profile Recent Labs    12/25/21 0539  CHOL 161  HDL 57  LDLCALC 92  TRIG 61  CHOLHDL 2.8   Thyroid function  studies No results for input(s): "TSH", "T4TOTAL", "T3FREE", "THYROIDAB" in the last 72 hours.  Invalid input(s): "FREET3" Anemia work up No results for input(s): "VITAMINB12", "FOLATE", "FERRITIN", "TIBC", "IRON", "RETICCTPCT" in the last 72 hours. Urinalysis    Component Value Date/Time   COLORURINE YELLOW 12/20/2021 0408   APPEARANCEUR CLEAR 12/20/2021 0408   LABSPEC 1.019 12/20/2021 0408   PHURINE 5.0 12/20/2021 0408   GLUCOSEU NEGATIVE 12/20/2021 0408   HGBUR NEGATIVE 12/20/2021 0408   BILIRUBINUR NEGATIVE 12/20/2021 0408   KETONESUR NEGATIVE 12/20/2021 0408   PROTEINUR NEGATIVE 12/20/2021 0408   UROBILINOGEN 1.0 04/14/2014 0116   NITRITE NEGATIVE 12/20/2021 0408   LEUKOCYTESUR NEGATIVE 12/20/2021 0408   Sepsis Labs Recent Labs  Lab 12/19/21 2326 12/21/21 0543 12/25/21 0539  WBC 7.9 6.7 6.1   Microbiology Recent Results (from the past 240 hour(s))  Resp Panel by RT-PCR (Flu A&B, Covid) Anterior Nasal Swab     Status: None   Collection Time: 12/20/21 12:10 AM   Specimen: Anterior Nasal Swab  Result Value Ref Range Status   SARS Coronavirus 2 by RT PCR NEGATIVE NEGATIVE Final    Comment: (NOTE) SARS-CoV-2 target nucleic acids are NOT DETECTED.  The SARS-CoV-2 RNA is generally detectable in upper respiratory specimens during the acute phase of infection. The lowest concentration of SARS-CoV-2 viral copies this assay can detect is 138 copies/mL. A negative result does not preclude SARS-Cov-2 infection and should not be used as the sole basis for treatment or other patient management decisions. A negative result may occur with  improper specimen collection/handling, submission of specimen other than nasopharyngeal swab, presence of viral mutation(s) within the areas targeted by this assay, and inadequate number of viral copies(<138 copies/mL). A negative result must be combined with clinical observations, patient history, and epidemiological information. The  expected result is Negative.  Fact Sheet for Patients:  EntrepreneurPulse.com.au  Fact Sheet for Healthcare Providers:  IncredibleEmployment.be  This test is no t yet approved or cleared by the [place] FDA and  has been authorized for detection and/or diagnosis of SARS-CoV-2 by FDA under an Emergency Use Authorization (EUA). This EUA will remain  in effect (meaning this test can be used) for the duration of the COVID-19 declaration under Section 564(b)(1) of the Act, 21 U.S.C.section 360bbb-3(b)(1), unless the authorization is terminated  or revoked sooner.       Influenza A by PCR NEGATIVE NEGATIVE Final   Influenza B by PCR NEGATIVE NEGATIVE Final  Comment: (NOTE) The Xpert Xpress SARS-CoV-2/FLU/RSV plus assay is intended as an aid in the diagnosis of influenza from Nasopharyngeal swab specimens and should not be used as a sole basis for treatment. Nasal washings and aspirates are unacceptable for Xpert Xpress SARS-CoV-2/FLU/RSV testing.  Fact Sheet for Patients: EntrepreneurPulse.com.au  Fact Sheet for Healthcare Providers: IncredibleEmployment.be  This test is not yet approved or cleared by the [place] FDA and has been authorized for detection and/or diagnosis of SARS-CoV-2 by FDA under an Emergency Use Authorization (EUA). This EUA will remain in effect (meaning this test can be used) for the duration of the COVID-19 declaration under Section 564(b)(1) of the Act, 21 U.S.C. section 360bbb-3(b)(1), unless the authorization is terminated or revoked.  Performed at [facility], [place] [address], [place], [place] [address]      Time coordinating discharge: 35 minutes  SIGNED:   Aline August, MD  [facility] 12/25/2021, 2:42 PM

## 2021-12-25 NOTE — Progress Notes (Signed)
Brief cardiology progress note  Reviewed recommendations from the admission. Patient has now completed TEE. Based on prior recommendations, plan is for outpatient follow up with the structural heart clinic and outpatient CT (scheduled 12/31/21).  Cardiology will sign off at this time. Message sent to scheduling for outpatient structural heart follow up.  Buford Dresser, MD, PhD, [facility] at [facility] [address], [place], [place] [address] [phone]

## 2021-12-25 NOTE — Progress Notes (Signed)
Occupational Therapy Treatment Patient Details Name: [name] MRN: [id number] DOB: 12-25-1927 Today's Date: 12/25/2021   History of present illness Patient is a 88 year old male who presented to the hosptial after near syncopal episode.  PMH: chronic diastolic heart failure, HTN, s/p TAVR, diverticulosis, GERD, gilbert's syndrome, HOH, rheumatoid arthritis, CKD   OT comments  Patient demonstrates ability to transfer out of bed, ambulate to bathroom, perform standing toileting and standing grooming task as well as ambulation in hall without a device. No physical assistance needed. Patient reports no deficits and feeling "completely good" via interpreter. Patient has met OT goals. No further OT needed.   Recommendations for follow up therapy are one component of a multi-disciplinary discharge planning process, led by the attending physician.  Recommendations may be updated based on patient status, additional functional criteria and insurance authorization.    Follow Up Recommendations  No OT follow up    Assistance Recommended at Discharge PRN  Patient can return home with the following  Assistance with cooking/housework;Direct supervision/assist for financial management;Direct supervision/assist for medications management   Equipment Recommendations  None recommended by OT    Recommendations for Other Services      Precautions / Restrictions Precautions Precautions: None Restrictions Weight Bearing Restrictions: No       Mobility Bed Mobility Overal bed mobility: Independent                  Transfers Overall transfer level: Needs assistance                 General transfer comment: Patient ambulated in room and hall without physical assistance. No overt loss of balance with normal ambulation. Therapist only supervising.     Balance Overall balance assessment: No apparent balance deficits (not formally assessed)                                          ADL either performed or assessed with clinical judgement   ADL Overall ADL's : Independent                                       General ADL Comments: Patient demonstrates ability to get out of bed, ambualte to bathroom, perform standign toileting task and then grooming task. Then ambulated in hall. Via intepreter patient reports feeling completely normal without complaints of weakness or impairment.    Extremity/Trunk Assessment Upper Extremity Assessment Upper Extremity Assessment: Overall WFL for tasks assessed   Lower Extremity Assessment Lower Extremity Assessment: Overall WFL for tasks assessed   Cervical / Trunk Assessment Cervical / Trunk Assessment: Normal    Vision   Vision Assessment?: No apparent visual deficits   Perception     Praxis      Cognition Arousal/Alertness: Awake/alert Behavior During Therapy: WFL for tasks assessed/performed Overall Cognitive Status: Difficult to assess                                 General Comments: Appears functional. Able to answer questions via intepreter and follow commands.        Exercises      Shoulder Instructions       General Comments      Pertinent Vitals/ Pain  Pain Assessment Pain Assessment: No/denies pain  Home Living                                          Prior Functioning/Environment              Frequency           Progress Toward Goals  OT Goals(current goals can now be found in the care plan section)  Progress towards OT goals: Goals met/education completed, patient discharged from [facility] [place] All goals met and education completed, patient discharged from OT services    Co-evaluation                 AM-PAC OT "6 Clicks" Daily Activity     Outcome Measure   Help from another person eating meals?: None Help from another person taking care of personal grooming?: None Help from another person  toileting, which includes using toliet, bedpan, or urinal?: None Help from another person bathing (including washing, rinsing, drying)?: None Help from another person to put on and taking off regular upper body clothing?: None Help from another person to put on and taking off regular lower body clothing?: None 6 Click Score: 24    End of Session    OT Visit Diagnosis: Muscle weakness (generalized) (M62.81)   Activity Tolerance Patient tolerated treatment well   Patient Left in chair;with call bell/phone within reach;with chair alarm set   Nurse Communication Mobility status        Time: 1210-1223 OT Time Calculation (min): 13 min  Charges: OT General Charges $OT Visit: 1 Visit OT Treatments $Self Care/Home Management : 8-22 mins  [name], OTR/L [place]  Office [phone] Pager: [phone]   Lenward Chancellor 12/25/2021, 1:26 PM

## 2021-12-26 ENCOUNTER — Encounter (HOSPITAL_COMMUNITY): Payer: Self-pay | Admitting: Internal Medicine

## 2021-12-26 LAB — GLUCOSE, CAPILLARY: Glucose-Capillary: 113 mg/dL — ABNORMAL HIGH (ref 70–99)

## 2021-12-26 NOTE — Evaluation (Signed)
Clinical/Bedside Swallow Evaluation Patient Details  Name: [name] MRN: [id number] Date of Birth: 1927-09-19  Today's Date: 12/26/2021 Time: SLP Start Time (ACUTE ONLY): 0815 SLP Stop Time (ACUTE ONLY): 0835 SLP Time Calculation (min) (ACUTE ONLY): 20 min  Past Medical History:  Past Medical History:  Diagnosis Date   BPH (benign prostatic hypertrophy)    Depression    Diverticulosis of colon    GERD (gastroesophageal reflux disease)    Gilbert's syndrome    History of kidney stones    HLD (hyperlipidemia)    HOH (hard of hearing)    refuses to wear his Hearing Aid   Hypertension    Inguinal hernia    right   Internal hemorrhoid    Normal coronary arteries    a. Cath 07/2010: normal coronaries. Felt to have noncardiac CP/SOB at that time possibly r/t anxiety.   RA (rheumatoid arthritis) (HCC)    bilateral hands/ wrist--  seronegative   S/P TAVR (transcatheter aortic valve replacement) 03/27/2018   23 mm Edwards Sapien 3 transcatheter heart valve placed via percutaneous right transfemoral approach    Severe aortic stenosis    Thyroid nodule    noted 11-2009   Wears dentures    Past Surgical History:  Past Surgical History:  Procedure Laterality Date   CARDIAC CATHETERIZATION  07-19-2010  dr Tressia Miners turner   normal coronary arteries,  ef 60%,  moderate aortic stenosis- gradient 1mHg, normal right heart pressure   CARDIOVASCULAR STRESS TEST  05/ 2011   dr sMarlou Porch  normal low risk perfusion study   CATARACT EXTRACTION W/ INTRAOCULAR LENS  IMPLANT, BILATERAL  2013   CATARACT EXTRACTION, BILATERAL     CHOLECYSTECTOMY N/A 10/11/2018   Procedure: LAPAROSCOPIC CHOLECYSTECTOMY WITH INTRAOPERATIVE CHOLANGIOGRAM;  Surgeon: TJovita Kussmaul MD;  [facility];  Service: General;  Laterality: N/A;   COLONOSCOPY  2010  approx   ERCP N/A 09/25/2018   Procedure: ENDOSCOPIC RETROGRADE CHOLANGIOPANCREATOGRAPHY (ERCP);  Surgeon: MClarene Essex MD;  [facility]  Service:  Endoscopy;  Laterality: N/A;   INGUINAL HERNIA REPAIR Right 08/23/2018   Procedure: OPEN RIGHT INGUINAL HERNIA REPAIR WITH MESH;  Surgeon: GArmandina Gemma MD;  [facility];  Service: General;  Laterality: Right;   INTRAOPERATIVE TRANSTHORACIC ECHOCARDIOGRAM N/A 03/27/2018   Procedure: INTRAOPERATIVE TRANSTHORACIC ECHOCARDIOGRAM;  Surgeon: MBurnell Blanks MD;  [facility]  Service: Open Heart Surgery;  Laterality: N/A;   REMOVAL OF STONES  09/25/2018   Procedure: REMOVAL OF STONES;  Surgeon: MClarene Essex MD;  [facility]  Service: Endoscopy;;   RIGHT/LEFT HEART CATH AND CORONARY ANGIOGRAPHY N/A 02/06/2018   Procedure: RIGHT/LEFT HEART CATH AND CORONARY ANGIOGRAPHY;  Surgeon: MBurnell Blanks MD;  [facility];  Service: Cardiovascular;  Laterality: N/A;   SPHINCTEROTOMY  09/25/2018   Procedure: SPHINCTEROTOMY;  Surgeon: MClarene Essex MD;  [facility]  Service: Endoscopy;;   THYROID LOBECTOMY Right 05/05/2015   Procedure: RIGHT THYROID LOBECTOMY;  Surgeon: TArmandina Gemma MD;  [facility]  Service: General;  Laterality: Right;   TRANSANAL HEMORRHOIDAL DEARTERIALIZATION N/A 04/09/2014   Procedure: TRANSANAL HEMORRHOIDAL DEARTERIALIZATION OF INTERNAL HEMORROIDS;  Surgeon: ALeighton Ruff MD;  [facility]  Service: General;  Laterality: N/A;   TRANSCATHETER AORTIC VALVE REPLACEMENT, TRANSFEMORAL  03/27/2018   TRANSCATHETER AORTIC VALVE REPLACEMENT, TRANSFEMORAL N/A 03/27/2018   Procedure: TRANSCATHETER AORTIC VALVE REPLACEMENT, TRANSFEMORAL. Edwards SAPIEN 3 Transcatheter Heart Valve 247m;  Surgeon: McBurnell BlanksMD;  [facility]  Service: Open Heart Surgery;  Laterality: N/A;   TRANSTHORACIC ECHOCARDIOGRAM  11-26-2013   moderate focal basal and mild LVH/  ef 58-83%/  grade I diastolic dysfunction/ mild LAE/  moderate calcification with stenosis AV with mild regurg,  gradients 35 abd 58 mmHg /  mild  TR   HPI:  88 y.o. male presented to ED 7/10 with N/V and near-syncope. MRI brain showed a punctate acute/subacute infarct in the left parietal lobe. PMHx CAD, severe aortic stensois s/p TAVR, hypertension, HLD, rheumatoid arthritis, dementia.  Lives alone. 08/29/16 MBS: primary esophageal dysphagia; functional oropharyngeal swallow.    Assessment / Plan / Recommendation  Clinical Impression  Pt was seen for bedside swallow evaluation. [employer] [place] interpreter, Verdis Frederickson (ID# [phone]), was used for translation. Pt denied a history of dysphagia or any acute changes in swallow function. Oral mechanism exam was limited due to pt's difficulty following some commands; however, oral motor strength and ROM appeared grossly [facility] and he presented with full dentures. He tolerated all solids and liquids without signs or symptoms of oropharyngeal dysphagia. A regular texture diet with thin liquids is recommended at this time and further skilled SLP services are not clinically indicated for swallowing. SLP was not consulted for cognition/communication evaluation. Pt report of changes in processing and memory were inconsistent. Processing speed appeared slower, but the use of translation and pt's hearing impairment were likely contributing factors. No family was present to provide information regarding pt's history and his proximity to baseline, and pt does have a diagnosis of dementia. Pt is scheduled to be discharged today; a language assessment may be necessary at the next level of care if pt's speech, communication, and/or cognition are not at baseline. SLP Visit Diagnosis: Dysphagia, unspecified (R13.10)    Aspiration Risk  No limitations    Diet Recommendation Regular;Thin liquid   Liquid Administration via: Cup;Straw Medication Administration: Whole meds with liquid Supervision: Patient able to self feed Postural Changes: Seated upright at 90 degrees    Other  Recommendations Oral Care  Recommendations: Oral care BID    Recommendations for follow up therapy are one component of a multi-disciplinary discharge planning process, led by the attending physician.  Recommendations may be updated based on patient status, additional functional criteria and insurance authorization.  Follow up Recommendations No SLP follow up      Assistance Recommended at Discharge    Functional Status Assessment Patient has not had a recent decline in their functional status  Frequency and Duration            Prognosis        Swallow Study   General Date of Onset: 12/25/21 HPI: 88 y.o. male presented to ED 7/10 with N/V and near-syncope. MRI brain showed a punctate acute/subacute infarct in the left parietal lobe. PMHx CAD, severe aortic stensois s/p TAVR, hypertension, HLD, rheumatoid arthritis, dementia.  Lives alone. 08/29/16 MBS: primary esophageal dysphagia; functional oropharyngeal swallow. Type of Study: Bedside Swallow Evaluation Previous Swallow Assessment: See HPI Diet Prior to this Study: Regular;Thin liquids Temperature Spikes Noted: No Respiratory Status: Room air History of Recent Intubation: No Behavior/Cognition: Alert;Cooperative;Pleasant mood Oral Cavity Assessment: Within Functional Limits Oral Care Completed by SLP: No Oral Cavity - Dentition: Edentulous Vision: Functional for self-feeding Self-Feeding Abilities: Able to feed self Patient Positioning: Upright in bed;Postural control adequate for testing Baseline Vocal Quality: Normal Volitional Cough: Strong Volitional Swallow: Able to elicit    Oral/Motor/Sensory Function Overall Oral Motor/Sensory Function: Within functional limits   Ice  Chips Ice chips: Not tested   Thin Liquid Thin Liquid: Within functional limits Presentation: Straw    Nectar Thick Nectar Thick Liquid: Not tested   Honey Thick Honey Thick Liquid: Not tested   Puree Puree: Within functional limits   Solid     Solid: Within functional  limits Presentation: [place] [place], [place], [place] Office number [phone] Pager [phone]  Horton Marshall 12/26/2021,8:35 AM

## 2021-12-26 NOTE — Progress Notes (Signed)
Discharge was held on 12/25/2021 due to pending SLP evaluation.  SLP evaluation has been done this morning.  Diet will be as per SLP recommendations.  Patient seen and examined at bedside.  He is currently medically stable for discharge.  Please refer to the full discharge summary done by me on 12/25/2021 for full details.

## 2021-12-28 ENCOUNTER — Telehealth: Payer: Self-pay

## 2021-12-28 NOTE — Telephone Encounter (Signed)
Spoke with patient's daughter [name] and scheduled a Mychart Palliative Consult for 01/03/22 @ 12:30PM  Consent obtained; updated Netsmart, Team List and Epic.

## 2021-12-30 DIAGNOSIS — Z9049 Acquired absence of other specified parts of digestive tract: Secondary | ICD-10-CM | POA: Diagnosis not present

## 2021-12-30 DIAGNOSIS — I69354 Hemiplegia and hemiparesis following cerebral infarction affecting left non-dominant side: Secondary | ICD-10-CM | POA: Diagnosis not present

## 2021-12-30 DIAGNOSIS — Z7982 Long term (current) use of aspirin: Secondary | ICD-10-CM | POA: Diagnosis not present

## 2021-12-30 DIAGNOSIS — K219 Gastro-esophageal reflux disease without esophagitis: Secondary | ICD-10-CM | POA: Diagnosis not present

## 2021-12-30 DIAGNOSIS — K7460 Unspecified cirrhosis of liver: Secondary | ICD-10-CM | POA: Diagnosis not present

## 2021-12-30 DIAGNOSIS — E785 Hyperlipidemia, unspecified: Secondary | ICD-10-CM | POA: Diagnosis not present

## 2021-12-30 DIAGNOSIS — I5032 Chronic diastolic (congestive) heart failure: Secondary | ICD-10-CM | POA: Diagnosis not present

## 2021-12-30 DIAGNOSIS — F32A Depression, unspecified: Secondary | ICD-10-CM | POA: Diagnosis not present

## 2021-12-30 DIAGNOSIS — M069 Rheumatoid arthritis, unspecified: Secondary | ICD-10-CM | POA: Diagnosis not present

## 2021-12-30 DIAGNOSIS — D696 Thrombocytopenia, unspecified: Secondary | ICD-10-CM | POA: Diagnosis not present

## 2021-12-30 DIAGNOSIS — Z9842 Cataract extraction status, left eye: Secondary | ICD-10-CM | POA: Diagnosis not present

## 2021-12-30 DIAGNOSIS — I130 Hypertensive heart and chronic kidney disease with heart failure and stage 1 through stage 4 chronic kidney disease, or unspecified chronic kidney disease: Secondary | ICD-10-CM | POA: Diagnosis not present

## 2021-12-30 DIAGNOSIS — I350 Nonrheumatic aortic (valve) stenosis: Secondary | ICD-10-CM | POA: Diagnosis not present

## 2021-12-30 DIAGNOSIS — N182 Chronic kidney disease, stage 2 (mild): Secondary | ICD-10-CM | POA: Diagnosis not present

## 2021-12-30 DIAGNOSIS — K648 Other hemorrhoids: Secondary | ICD-10-CM | POA: Diagnosis not present

## 2021-12-30 DIAGNOSIS — F1090 Alcohol use, unspecified, uncomplicated: Secondary | ICD-10-CM | POA: Diagnosis not present

## 2021-12-30 DIAGNOSIS — H9190 Unspecified hearing loss, unspecified ear: Secondary | ICD-10-CM | POA: Diagnosis not present

## 2021-12-30 DIAGNOSIS — Z87891 Personal history of nicotine dependence: Secondary | ICD-10-CM | POA: Diagnosis not present

## 2021-12-30 DIAGNOSIS — Z9841 Cataract extraction status, right eye: Secondary | ICD-10-CM | POA: Diagnosis not present

## 2021-12-30 DIAGNOSIS — I2510 Atherosclerotic heart disease of native coronary artery without angina pectoris: Secondary | ICD-10-CM | POA: Diagnosis not present

## 2021-12-30 DIAGNOSIS — Z952 Presence of prosthetic heart valve: Secondary | ICD-10-CM | POA: Diagnosis not present

## 2021-12-30 DIAGNOSIS — K5730 Diverticulosis of large intestine without perforation or abscess without bleeding: Secondary | ICD-10-CM | POA: Diagnosis not present

## 2021-12-31 ENCOUNTER — Ambulatory Visit (HOSPITAL_COMMUNITY)
Admission: RE | Admit: 2021-12-31 | Discharge: 2021-12-31 | Disposition: A | Discharge status: Home / Self Care | Payer: Medicare Other | Source: Ambulatory Visit | Attending: Internal Medicine | Admitting: Internal Medicine

## 2021-12-31 DIAGNOSIS — Z952 Presence of prosthetic heart valve: Secondary | ICD-10-CM | POA: Diagnosis not present

## 2021-12-31 DIAGNOSIS — I350 Nonrheumatic aortic (valve) stenosis: Secondary | ICD-10-CM | POA: Diagnosis not present

## 2021-12-31 DIAGNOSIS — Z01818 Encounter for other preprocedural examination: Secondary | ICD-10-CM | POA: Diagnosis not present

## 2021-12-31 DIAGNOSIS — I700 Atherosclerosis of aorta: Secondary | ICD-10-CM | POA: Diagnosis not present

## 2021-12-31 MED ORDER — IOHEXOL 350 MG/ML SOLN
100.0000 mL | Freq: Once | INTRAVENOUS | Status: AC | PRN
  Administered 2021-12-31: 100 mL via INTRAVENOUS

## 2022-01-03 ENCOUNTER — Telehealth: Payer: Medicare Other | Admitting: Family Medicine

## 2022-01-03 DIAGNOSIS — F0390 Unspecified dementia without behavioral disturbance: Secondary | ICD-10-CM

## 2022-01-03 DIAGNOSIS — R55 Syncope and collapse: Secondary | ICD-10-CM

## 2022-01-03 NOTE — Progress Notes (Unsigned)
[facility] Consult Note Telephone: [phone]  Fax: [phone]   Date of encounter: 01/03/22 12:36 PM PATIENT NAME: [name] 88  [address] [place] [address] [place] [address]   [phone] (home)  DOB: [birth year] MRN: [id number] PRIMARY CARE PROVIDER:    Lajean Manes, MD,  [place] [place] [address] [phone]  REFERRING PROVIDER:   Lajean Manes, MD [address] [place],  [place] [address] [phone]  RESPONSIBLE PARTY:    Contact Information     Name Relation Home Work [name] Daughter [phone]  [phone]   [name] Daughter [phone]  [phone]      I connected with  [name] who had his daughter interpreting for him on 01/04/22 by a video enabled telemedicine application and verified that I am speaking with the correct person using two identifiers.   I discussed the limitations of evaluation and management by telemedicine. The patient expressed understanding and agreed to proceed.   Palliative Care was asked to follow this patient by consultation request of  Ryan Manes, MD to address advance care planning and complex medical decision making. This is the initial visit.          ASSESSMENT, SYMPTOM MANAGEMENT AND PLAN / RECOMMENDATIONS:   Near syncope Question if combination of bradycardia and AS could explain dizziness. Also differential could include vestibular neuritis-would recommend follow up with ENT and possible trial of either steroids and/or medication such as antivert. Might also consider ammonia level given hx of cirrhosis  2.  Dementia without behavioral change Agree with stopping Aricept due to potential nausea and/or bradycardia SW referral to assist with CAP-C application.    Follow up Palliative Care Visit: Palliative care will continue to follow for complex medical decision making, advance care planning, and clarification of  goals. Return 2-3 weeks or prn.    This visit was coded based on medical decision making (MDM).  PPS: 60%  HOSPICE ELIGIBILITY/DIAGNOSIS: TBD  Chief Complaint:  [facility] received a referral to follow up with patient for chronic disease management in setting of dementia with hx of CVA and significant heart disease.  Palliative Care is also following to assist with advance directive planning and defining/refining goals of care.  HISTORY OF PRESENT ILLNESS:  [name] is a 88 y.o. year old male with dementia, bradycardia, HTN, severe aortic stenosis s/p TAVR, chronic diastolic CHF, hx of CVA, unspecified cirrhosis, right lobe thyroid neoplasm, RA, CKD stage II, BPH, HLD and diverticulosis.  His daughter interprets for him.  She says he has c/o dizziness particularly after eating and feels the need to lay down to get better.  She says he describes it as he gets dizzy and "feels like head is stuffed with cotton".  A few weeks ago he had 4 falls.  He walked to the end of the driveway and was tired, could not return to home on his own.  Sleeping about 10-12 hours/day. He had TAVR October 2019. Sometimes forgets to eat.  He had a very small stroke. Even the aortic valve doesn't explain his symptoms. He has an aide who comes to help bathe and dress him. Stopped giving him Aricept a few days ago because of his nausea.  They are trying to apply for CAP-C program to help with his care.  Family gives his medication because he either takes too much forgetting he has already taken it or will miss 5 days. He lays down  a lot. He did have constipation and they are giving him Senokot.  He feels tightness in chest and SOB.   Daughter [name] is [place] since 2012. She states he has expressed to her that he wants to be DNR/DNI, no feeding tube and no heroic measures to keep him alive. In general using [place] [address] [place] if immediate need.  She  states  Zofran helps his nausea but he doesn't want to take it. Did orthostatic Bps in hospital and didn't see a large change.  Cardiologist doesn't think his aortic valve can explain all his symptoms. Daughter says Dr Felipa Eth completed the application but she needs help with CAP-C program application . History obtained from review of EMR, discussion with daughter and/or [name].    12/25/21: CMP remarkable for elevated glucose 122 and BUN 24 with normal Cr 1.07.  Phos normal at 3.7, Mag at 2.1. Albumin low at 3.4.Normal total bili 1.2 MR angio head wo contrast: IMPRESSION: Normal MRA circle-[place]-Willis without evidence for significant proximal stenosis, aneurysm, or branch vessel occlusion.  12/24/21 MR Brain wo contrast IMPRESSION: 1. Punctate cortical acute/subacute infarct in the left parietal lobe. 2. Moderate chronic white matter disease. 3. Moderate parenchymal volume loss with medial temporal lobe predominance.  12/21/21 Vas US Carotid: No significant stenosis  12/20/21 Lipase normal at 38, AST/ALT normal, Normal direct bilirubin and elevated indirect bili 1.6 CXR 12/20/21: IMPRESSION: No active cardiopulmonary disease.  Echo 12/20/21: EF 70 to 75%. The left ventricle has hyperdynamic function with no regional wall motion abnormalities.  I reviewed available labs, medications, imaging, studies and related documents from the EMR.  Records reviewed and summarized above.   ROS General: NAD EYES: denies vision changes ENMT: denies dysphagia Cardiovascular: denies chest pain, denies DOE Pulmonary: denies cough, denies increased SOB Abdomen: endorses fair appetite, denies constipation, endorses continence of bowel GU: denies dysuria, endorses continence of urine MSK:  denies increased weakness, multiple recent falls reported Neurological: denies pain, denies insomnia Psych: Endorses depressed mood per daughter  Physical Exam: limited to observation Current and past  weights: 169.4 08/20/21, 163.8 on 12/03/21 Constitutional: NAD General: WNWD EYES: anicteric sclera, lids intact, no discharge  ENMT: hard of hearing CV: No visible cyanosis or difficulty with speaking and having to stop to breathe Pulmonary: no increased work of breathing, no cough, no audible rales or wheezes MSK: moves all extremities, ambulatory Psych: non-anxious affect, A and O x 3   CURRENT PROBLEM LIST:  Patient Active Problem List   Diagnosis Date Noted   CVA (cerebral vascular accident) ([place]) 12/25/2021   Symptomatic severe aortic stenosis with normal ejection fraction 12/21/2021   Near syncope 12/20/2021   Dementia ([place]) 12/20/2021   Unspecified cirrhosis of liver ([place]) 12/20/2021   Hyperbilirubinemia 12/20/2021   Syncope 10/04/2019   Abdominal pain 10/09/2018   CKD (chronic kidney disease), stage II 10/09/2018   RUQ pain    Elevated LFTs 09/22/2018   Inguinal hernia of right side without obstruction or gangrene 08/22/2018   Chronic diastolic CHF (congestive heart failure) ([place]) 07/24/2018   Left-sided carotid artery disease ([place]) 07/24/2018   Chest pain 06/05/2018   S/P TAVR (transcatheter aortic valve replacement) 03/27/2018   RA (rheumatoid arthritis) (HCC)    HOH (hard of hearing)    GERD (gastroesophageal reflux disease)    Benign prostatic hyperplasia    HLD (hyperlipidemia)    Depression    Diverticulosis 02/07/2018   Severe aortic stenosis  Benign essential HTN    Musculoskeletal chest pain 08/20/2016   Neoplasm of uncertain behavior of thyroid gland, 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   Sinus bradycardia 06/20/2014   PAST MEDICAL HISTORY:  Active Ambulatory Problems    Diagnosis Date Noted   Sinus bradycardia 06/20/2014   Neoplasm of uncertain behavior of thyroid gland, 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   Musculoskeletal chest pain 08/20/2016   Benign essential HTN    Severe aortic stenosis    Diverticulosis 02/07/2018   S/P TAVR (transcatheter aortic valve replacement)  03/27/2018   RA (rheumatoid arthritis) (HCC)    HOH (hard of hearing)    GERD (gastroesophageal reflux disease)    Benign prostatic hyperplasia    HLD (hyperlipidemia)    Depression    Chest pain 06/05/2018   Chronic diastolic CHF (congestive heart failure) ([place]) 07/24/2018   Left-sided carotid artery disease ([place]) 07/24/2018   Inguinal hernia of right side without obstruction or gangrene 08/22/2018   Elevated LFTs 09/22/2018   Abdominal pain 10/09/2018   CKD (chronic kidney disease), stage II 10/09/2018   RUQ pain    Syncope 10/04/2019   Near syncope 12/20/2021   Dementia ([place]) 12/20/2021   Unspecified cirrhosis of liver ([place]) 12/20/2021   Hyperbilirubinemia 12/20/2021   Symptomatic severe aortic stenosis with normal ejection fraction 12/21/2021   CVA (cerebral vascular accident) ([place]) 12/25/2021   Resolved Ambulatory Problems    Diagnosis Date Noted   Rotator cuff syndrome of right shoulder 08/01/2013   Aortic valve disorders 11/07/2013   Rectal bleeding 01/20/2014   Aortic stenosis 06/20/2014   Chest tightness 06/20/2014   Essential hypertension 06/20/2014   Insomnia 06/20/2014   Pneumothorax 08/13/2016   Ileus ([place]) 08/20/2016   Dysphagia 08/20/2016   Abdominal distention    Pneumoperitoneum    Pain    Acute blood loss anemia    Tachypnea    Hemoperitoneum    Adynamic ileus (HCC)    Nausea in adult    Acute diastolic heart failure (HCC)    Closed T10 spinal fracture ([place]) 08/29/2016   Chest pain at rest 02/04/2018   Abdominal bloating    Gilbert's syndrome    AKI (acute kidney injury) ([place]) 09/24/2018   Past Medical History:  Diagnosis Date   BPH (benign prostatic hypertrophy)    Diverticulosis of colon    History of kidney stones    Hypertension    Inguinal hernia    Internal hemorrhoid    Normal coronary arteries    Thyroid nodule    Wears dentures    SOCIAL HX:  Social History   Tobacco Use   Smoking status: Former    Years: 40.00    Types:  Cigarettes    Start date: 06/13/1973    Quit date: 04/03/1981    Years since quitting: 40.7   Smokeless tobacco: Never  Substance Use Topics   Alcohol use: Yes    Comment: occasional   FAMILY HX:  Family History  Problem Relation Age of Onset   Pulmonary embolism Mother 55       caused by complications of surgery   CVA Father    Diabetes Brother    Diabetes Brother    Breast cancer Sister        Preferred Pharmacy: ALLERGIES:  Allergies  Allergen Reactions   Ativan [Lorazepam] Other (See Comments)    hallucinations   Phenergan [Promethazine Hcl] Hypertension    hallucinations     PERTINENT MEDICATIONS:  Outpatient Encounter Medications as of 01/03/2022  Medication Sig  aspirin 81 MG tablet Take 1 tablet (81 mg total) by mouth daily. (Patient taking differently: Take 81 mg by mouth daily.)   clobetasol cream (TEMOVATE) 6.65 % Apply 1 application. topically 2 (two) times daily. (Patient not taking: Reported on 12/20/2021)   dutasteride (AVODART) 0.5 MG capsule Take 0.5 mg by mouth daily.   escitalopram (LEXAPRO) 20 MG tablet Take 1 tablet (20 mg total) by mouth daily. (Patient taking differently: Take 20 mg by mouth at bedtime.)   losartan (COZAAR) 25 MG tablet Take 25 mg by mouth daily.    Melatonin 5 MG TABS Take 10-15 mg by mouth at bedtime as needed (for sleep).   Multiple Vitamin (MULTIVITAMIN WITH MINERALS) TABS tablet Take 1 tablet by mouth daily.   ondansetron (ZOFRAN) 4 MG tablet Take 1 tablet (4 mg total) by mouth every 8 (eight) hours as needed for nausea or vomiting.   pantoprazole (PROTONIX) 40 MG tablet Take 1 tablet (40 mg total) by mouth daily.   polyethylene glycol (MIRALAX) 17 g packet Take 17 g by mouth daily as needed for moderate constipation.   Risankizumab-rzaa (SKYRIZI PEN) 150 MG/ML SOAJ Inject 150 mg into the skin as directed. Every 12 weeks for maintenance.   senna-docusate (SENOKOT-S) 8.6-50 MG tablet Take 1 tablet by mouth 2 (two) times daily.    No facility-administered encounter medications on file as of 01/03/2022.     ----------------------------------------------------------------------------------------- Advance Care Planning/Goals of Care: Goals include to maximize quality of life and symptom management. Health care surrogate gave her permission to discuss.Our advance care planning conversation included a discussion about:     Exploration of goals of care in the event of a sudden injury or illness-DNR/DNI, no feeding tube or heroic measures Identification  of a healthcare agent-daughter [name] Review and updating or creation of an  advance directive document . Decision not to resuscitate or to de-escalate disease focused treatments due to poor prognosis. CODE STATUS: Will address formal code status at follow up home visit   Thank you for the opportunity to participate in the care of [name].  The palliative care team will continue to follow. Please call our office at [phone] if we can be of additional assistance.   Marijo Conception, FNP-C  COVID-19 PATIENT SCREENING TOOL Asked and negative response unless otherwise noted:  Have you had symptoms of covid, tested positive or been in contact with someone with symptoms/positive test in the past 5-10 days?  No

## 2022-01-04 ENCOUNTER — Encounter: Payer: Self-pay | Admitting: Family Medicine

## 2022-01-04 DIAGNOSIS — Z9841 Cataract extraction status, right eye: Secondary | ICD-10-CM | POA: Diagnosis not present

## 2022-01-04 DIAGNOSIS — H9190 Unspecified hearing loss, unspecified ear: Secondary | ICD-10-CM | POA: Diagnosis not present

## 2022-01-04 DIAGNOSIS — M069 Rheumatoid arthritis, unspecified: Secondary | ICD-10-CM | POA: Diagnosis not present

## 2022-01-04 DIAGNOSIS — I130 Hypertensive heart and chronic kidney disease with heart failure and stage 1 through stage 4 chronic kidney disease, or unspecified chronic kidney disease: Secondary | ICD-10-CM | POA: Diagnosis not present

## 2022-01-04 DIAGNOSIS — F32A Depression, unspecified: Secondary | ICD-10-CM | POA: Diagnosis not present

## 2022-01-04 DIAGNOSIS — Z9049 Acquired absence of other specified parts of digestive tract: Secondary | ICD-10-CM | POA: Diagnosis not present

## 2022-01-04 DIAGNOSIS — I350 Nonrheumatic aortic (valve) stenosis: Secondary | ICD-10-CM | POA: Diagnosis not present

## 2022-01-04 DIAGNOSIS — Z7982 Long term (current) use of aspirin: Secondary | ICD-10-CM | POA: Diagnosis not present

## 2022-01-04 DIAGNOSIS — N182 Chronic kidney disease, stage 2 (mild): Secondary | ICD-10-CM | POA: Diagnosis not present

## 2022-01-04 DIAGNOSIS — Z952 Presence of prosthetic heart valve: Secondary | ICD-10-CM | POA: Diagnosis not present

## 2022-01-04 DIAGNOSIS — F1090 Alcohol use, unspecified, uncomplicated: Secondary | ICD-10-CM | POA: Diagnosis not present

## 2022-01-04 DIAGNOSIS — I69354 Hemiplegia and hemiparesis following cerebral infarction affecting left non-dominant side: Secondary | ICD-10-CM | POA: Diagnosis not present

## 2022-01-04 DIAGNOSIS — K5730 Diverticulosis of large intestine without perforation or abscess without bleeding: Secondary | ICD-10-CM | POA: Diagnosis not present

## 2022-01-04 DIAGNOSIS — K648 Other hemorrhoids: Secondary | ICD-10-CM | POA: Diagnosis not present

## 2022-01-04 DIAGNOSIS — K219 Gastro-esophageal reflux disease without esophagitis: Secondary | ICD-10-CM | POA: Diagnosis not present

## 2022-01-04 DIAGNOSIS — I5032 Chronic diastolic (congestive) heart failure: Secondary | ICD-10-CM | POA: Diagnosis not present

## 2022-01-04 DIAGNOSIS — K7460 Unspecified cirrhosis of liver: Secondary | ICD-10-CM | POA: Diagnosis not present

## 2022-01-04 DIAGNOSIS — D696 Thrombocytopenia, unspecified: Secondary | ICD-10-CM | POA: Diagnosis not present

## 2022-01-04 DIAGNOSIS — I2510 Atherosclerotic heart disease of native coronary artery without angina pectoris: Secondary | ICD-10-CM | POA: Diagnosis not present

## 2022-01-04 DIAGNOSIS — Z9842 Cataract extraction status, left eye: Secondary | ICD-10-CM | POA: Diagnosis not present

## 2022-01-04 DIAGNOSIS — Z87891 Personal history of nicotine dependence: Secondary | ICD-10-CM | POA: Diagnosis not present

## 2022-01-04 DIAGNOSIS — E785 Hyperlipidemia, unspecified: Secondary | ICD-10-CM | POA: Diagnosis not present

## 2022-01-04 NOTE — Progress Notes (Incomplete)
[facility] Consult Note Telephone: [phone]  Fax: [phone]   Date of encounter: 01/03/22 12:36 PM PATIENT NAME: [name] [address] [place] [address]   [phone] (home)  DOB: 25-Dec-1927 MRN: [id number] PRIMARY CARE PROVIDER:    Lajean Manes, MD,  [address] [place] [address] [phone]  REFERRING PROVIDER:   Lajean Manes, MD [address] [place] [place],  [place] [address] [phone]  RESPONSIBLE PARTY:    Contact Information     Name Relation Home Work Carnuel Daughter [phone]  [phone]   Gregary Cromer Daughter [phone]  [phone]      I connected with  [name] who had his daughter interpreting for him on 01/04/22 by a video enabled telemedicine application and verified that I am speaking with the correct person using two identifiers.   I discussed the limitations of evaluation and management by telemedicine. The patient expressed understanding and agreed to proceed.   Palliative Care was asked to follow this patient by consultation request of  Lajean Manes, MD to address advance care planning and complex medical decision making. This is the initial visit.          ASSESSMENT, SYMPTOM MANAGEMENT AND PLAN / RECOMMENDATIONS:      Follow up Palliative Care Visit: Palliative care will continue to follow for complex medical decision making, advance care planning, and clarification of goals. Return 2-3 weeks or prn.    This visit was coded based on medical decision making (MDM).  PPS: 60%  HOSPICE ELIGIBILITY/DIAGNOSIS: TBD  Chief Complaint:  [place] received a referral to follow up with patient for chronic disease management in setting of dementia with hx of CVA and significant heart disease.  Palliative Care is also following to assist with advance directive planning and defining/refining goals of  care.  HISTORY OF PRESENT ILLNESS:  [name] is a 88 y.o. year old male with dementia, bradycardia, HTN, severe aortic stenosis s/p TAVR, chronic diastolic CHF, hx of CVA, unspecified cirrhosis, right lobe thyroid neoplasm, RA, CKD stage II, BPH, HLD and diverticulosis.  His daughter interprets for him.  She says he has c/o dizziness particularly after eating and feels the need to lay down to get better.  She says he describes it as he gets dizzy and "feels like head is stuffed with cotton".  A few weeks ago he had 4 falls.  He walked to the end of the driveway and was tired, could not return to home on his own.  Sleeping about 10-12 hours/day. He had TAVR October 2019. Sometimes forgets to eat.  He had a very small stroke. Even the aortic valve doesn't explain his symptoms. He has an aide who comes to help bathe and dress him. Stopped giving him Aricept a few days ago because of his nausea.  They are trying to apply for CAP-C program to help with his care.  Family gives his medication because he either takes too much forgetting he has already taken it or will miss 5 days. He lays down a lot. He did have constipation and they are giving him Senokot.  He feels tightness in chest and SOB.   Daughter [name] is [place] since 2012. She states he has expressed to her that he wants to be DNR/DNI, no feeding tube and no heroic measures to keep him alive. In general using [facility] [address] if  immediate need.  She states  Zofran helps his nausea but he doesn't want to take it. Did orthostatic Bps in hospital and didn't see a large change.  Cardiologist doesn't think his aortic valve can explain all his symptoms. Daughter says Dr Felipa Eth completed the application but she needs help with CAP-C program application . History obtained from review of EMR, discussion with daughter and/or Mr. Pendell.    12/25/21: CMP remarkable for elevated glucose 122 and BUN 24 with normal  Cr 1.07.  Phos normal at 3.7, Mag at 2.1. Albumin low at 3.4.Normal total bili 1.2 MR angio head wo contrast: IMPRESSION: Normal MRA circle-of-Willis without evidence for significant proximal stenosis, aneurysm, or branch vessel occlusion.  12/24/21 MR Brain wo contrast IMPRESSION: 1. Punctate cortical acute/subacute infarct in the left parietal lobe. 2. Moderate chronic white matter disease. 3. Moderate parenchymal volume loss with medial temporal lobe predominance.  12/21/21 Vas US Carotid: No significant stenosis  12/20/21 Lipase normal at 38, AST/ALT normal, Normal direct bilirubin and elevated indirect bili 1.6 CXR 12/20/21: IMPRESSION: No active cardiopulmonary disease.  Echo 12/20/21: EF 70 to 75%. The left ventricle has hyperdynamic function with no regional wall motion abnormalities.  I reviewed available labs, medications, imaging, studies and related documents from the EMR.  Records reviewed and summarized above.   ROS General: NAD EYES: denies vision changes ENMT: denies dysphagia Cardiovascular: denies chest pain, denies DOE Pulmonary: denies cough, denies increased SOB Abdomen: endorses good appetite, denies constipation, endorses continence of bowel GU: denies dysuria, endorses continence of urine MSK:  denies increased weakness, no falls reported Skin: denies rashes or wounds Neurological: denies pain, denies insomnia Psych: Endorses positive mood Heme/lymph/immuno: denies bruises, abnormal bleeding  Physical Exam: Current and past weights: 169.4 08/20/21, 163.8 on 12/03/21 Constitutional: NAD General: frail appearing, thin/WNWD/obese  EYES: anicteric sclera, lids intact, no discharge  ENMT: intact hearing, oral mucous membranes moist, dentition intact CV: S1S2, RRR, no LE edema Pulmonary: CTAB, no increased work of breathing, no cough, room air Abdomen: intake 100%, normo-active BS + 4 quadrants, soft and non tender, no ascites GU: deferred MSK: no  sarcopenia, moves all extremities, ambulatory Skin: warm and dry, no rashes or wounds on visible skin Neuro:  no generalized weakness, no cognitive impairment Psych: non-anxious affect, A and O x 3 Hem/lymph/immuno: no widespread bruising  CURRENT PROBLEM LIST:  Patient Active Problem List   Diagnosis Date Noted  . CVA (cerebral vascular accident) ([place]) 12/25/2021  . Symptomatic severe aortic stenosis with normal ejection fraction 12/21/2021  . Near syncope 12/20/2021  . Dementia ([place]) 12/20/2021  . Unspecified cirrhosis of liver ([place]) 12/20/2021  . Hyperbilirubinemia 12/20/2021  . Syncope 10/04/2019  . Abdominal pain 10/09/2018  . CKD (chronic kidney disease), stage II 10/09/2018  . RUQ pain   . Elevated LFTs 09/22/2018  . Inguinal hernia of right side without obstruction or gangrene 08/22/2018  . Chronic diastolic CHF (congestive heart failure) ([place]) 07/24/2018  . Left-sided carotid artery disease ([place]) 07/24/2018  . Chest pain 06/05/2018  . S/P TAVR (transcatheter aortic valve replacement) 03/27/2018  . RA (rheumatoid arthritis) ([place])   . HOH (hard of hearing)   . GERD (gastroesophageal reflux disease)   . Benign prostatic hyperplasia   . HLD (hyperlipidemia)   . Depression   . Diverticulosis 02/07/2018  . Severe aortic stenosis   . Benign essential HTN   . Musculoskeletal chest pain 08/20/2016  . 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 Sinus  bradycardia 06/20/2014   PAST MEDICAL HISTORY:  Active Ambulatory Problems    Diagnosis Date Noted  . Sinus bradycardia 06/20/2014  . 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 Musculoskeletal chest pain 08/20/2016  . Benign essential HTN   . Severe aortic stenosis   . Diverticulosis 02/07/2018  . S/P TAVR (transcatheter aortic valve replacement) 03/27/2018  . RA (rheumatoid arthritis) ([place])   . HOH (hard of hearing)   . GERD (gastroesophageal reflux disease)    . Benign prostatic hyperplasia   . HLD (hyperlipidemia)   . Depression   . Chest pain 06/05/2018  . Chronic diastolic CHF (congestive heart failure) ([place]) 07/24/2018  . Left-sided carotid artery disease ([place]) 07/24/2018  . Inguinal hernia of right side without obstruction or gangrene 08/22/2018  . Elevated LFTs 09/22/2018  . Abdominal pain 10/09/2018  . CKD (chronic kidney disease), stage II 10/09/2018  . RUQ pain   . Syncope 10/04/2019  . Near syncope 12/20/2021  . Dementia ([place]) 12/20/2021  . Unspecified cirrhosis of liver ([place]) 12/20/2021  . Hyperbilirubinemia 12/20/2021  . Symptomatic severe aortic stenosis with normal ejection fraction 12/21/2021  . CVA (cerebral vascular accident) ([place]) 12/25/2021   Resolved Ambulatory Problems    Diagnosis Date Noted  . Rotator cuff syndrome of right shoulder 08/01/2013  . Aortic valve disorders 11/07/2013  . Rectal bleeding 01/20/2014  . Aortic stenosis 06/20/2014  . Chest tightness 06/20/2014  . Essential hypertension 06/20/2014  . Insomnia 06/20/2014  . Pneumothorax 08/13/2016  . Ileus ([place]) 08/20/2016  . Dysphagia 08/20/2016  . Abdominal distention   . Pneumoperitoneum   . Pain   . Acute blood loss anemia   . Tachypnea   . Hemoperitoneum   . Adynamic ileus ([place])   . Nausea in adult   . Acute diastolic heart failure ([place])   . Closed T10 spinal fracture ([place]) 08/29/2016  . Chest pain at rest 02/04/2018  . Abdominal bloating   . Gilbert's syndrome   . AKI (acute kidney injury) ([place]) 09/24/2018   Past Medical History:  Diagnosis Date  . BPH (benign prostatic hypertrophy)   . Diverticulosis of colon   . History of kidney stones   . Hypertension   . Inguinal hernia   . Internal hemorrhoid   . Normal coronary arteries   . Thyroid nodule   . Wears dentures    SOCIAL HX:  Social History   Tobacco Use  . Smoking status: Former    Years: 40.00    Types: Cigarettes    Start date: 06/13/1973    Quit date: 04/03/1981     Years since quitting: 40.7  . Smokeless tobacco: Never  Substance Use Topics  . Alcohol use: Yes    Comment: occasional   FAMILY HX:  Family History  Problem Relation Age of Onset  . Pulmonary embolism Mother 33       caused by complications of surgery  . CVA Father   . Diabetes Brother   . Diabetes Brother   . Breast cancer Sister        Preferred Pharmacy: ALLERGIES:  Allergies  Allergen Reactions  . Ativan [Lorazepam] Other (See Comments)    hallucinations  . Phenergan [Promethazine Hcl] Hypertension    hallucinations     PERTINENT MEDICATIONS:  Outpatient Encounter Medications as of 01/03/2022  Medication Sig  . aspirin 81 MG tablet Take 1 tablet (81 mg total) by mouth daily. (Patient taking differently: Take 81 mg by mouth daily.)  .  clobetasol cream (TEMOVATE) 1.66 % Apply 1 application. topically 2 (two) times daily. (Patient not taking: Reported on 12/20/2021)  . dutasteride (AVODART) 0.5 MG capsule Take 0.5 mg by mouth daily.  [place] escitalopram (LEXAPRO) 20 MG tablet Take 1 tablet (20 mg total) by mouth daily. (Patient taking differently: Take 20 mg by mouth at bedtime.)  . losartan (COZAAR) 25 MG tablet Take 25 mg by mouth daily.   . Melatonin 5 MG TABS Take 10-15 mg by mouth at bedtime as needed (for sleep).  . Multiple Vitamin (MULTIVITAMIN WITH MINERALS) TABS tablet Take 1 tablet by mouth daily.  . ondansetron (ZOFRAN) 4 MG tablet Take 1 tablet (4 mg total) by mouth every 8 (eight) hours as needed for nausea or vomiting.  . pantoprazole (PROTONIX) 40 MG tablet Take 1 tablet (40 mg total) by mouth daily.  . polyethylene glycol (MIRALAX) 17 g packet Take 17 g by mouth daily as needed for moderate constipation.  . Risankizumab-rzaa (SKYRIZI PEN) 150 MG/ML SOAJ Inject 150 mg into the skin as directed. Every 12 weeks for maintenance.  . senna-docusate (SENOKOT-S) 8.6-50 MG tablet Take 1 tablet by mouth 2 (two) times daily.   No facility-administered encounter medications  on file as of 01/03/2022.     -------------------------------------------------------------------------------------------------------------------------------------------------------------------------------------------------------------------------------------------- Advance Care Planning/Goals of Care: Goals include to maximize quality of life and symptom management. Patient/health care surrogate gave his/her permission to discuss.Our advance care planning conversation included a discussion about:    The value and importance of advance care planning  Experiences with loved ones who have been seriously ill or have died  Exploration of personal, cultural or spiritual beliefs that might influence medical decisions  Exploration of goals of care in the event of a sudden injury or illness  Identification  of a healthcare agent  Review and updating or creation of an  advance directive document . Decision not to resuscitate or to de-escalate disease focused treatments due to poor prognosis. CODE STATUS:  I spent *** minutes providing this consultation providing Palliative Care counseling on goals of care. More than 50% of the time in this consultation was spent in counseling and care coordination.   Thank you for the opportunity to participate in the care of Mr. Schlotter.  The palliative care team will continue to follow. Please call our office at [phone] if we can be of additional assistance.   Marijo Conception, FNP-C  COVID-19 PATIENT SCREENING TOOL Asked and negative response unless otherwise noted:  Have you had symptoms of covid, tested positive or been in contact with someone with symptoms/positive test in the past 5-10 days?

## 2022-01-05 NOTE — Progress Notes (Unsigned)
HEART AND [place]                                       Cardiology Office Note    Date:  01/12/2022   ID:  [name], DOB Aug 27, 1927, MRN [id number]  PCP:  Lajean Manes, MD  Cardiologist: Dr. Marlou Porch / Dr. Angelena Form & Dr. Roxy Manns (TAVR)  CC: leaflet thrombosis on TAVR valve   History of Present Illness:  [name] is a 88 y.o. male with a history of HTN, HLD, chronic diastolic CHF, RA on MTX, sinus bradycardia, depression and severe aortic stenosis s/p TAVR (03/27/18) who presents to clinic for follow up.     The patient was hospitalized to [facility] in 02/04/18 with symptoms of shortness of breath, abdominal bloating, chest pain and n/v.  He was hospitalized and evaluated comprehensively and found to have no significant acute problems with exception of the presence of severe aortic stenosis.  Peak velocity across aortic valve was measured 4.5 m/s corresponding to mean transvalvular gradient estimated 43 mmHg.  There was normal left ventricular systolic function.  The patient was referred to the multidisciplinary heart valve team and evaluated by Dr. Angelena Form. [facility] 02/06/18 showed normal mild nonobstructive coronary artery disease.   He ultimately underwent TAVR successful TAVR with a 23 mm Edwards Sapien 3 THV via the TF approach on 03/27/18. Post op echo showed EF 60%, normally functioning TAVR with mean gradient of 15 mm Hg and no mention of PVL on formal echo report. One month echo showed EF 60%, normally functioning TAVR with mean gradient of 14 mm HG (previously 15 mm hg) and new moderate PVL. Dr. Angelena Form and Dr. Roxy Manns felt that there was nothing to do in the absence of significant heart failure symptoms or symptomatic anemia.    He was admitted in 09/2018 for cholelithiasis/choledocholithiasis. He was treated with antibiotics and underwent laparoscopic cholecystectomy in 10/2018.   He was admitted in 09/2019 for syncope felt to be related to  vasovagal syncope. Echo at the time showed EF 60%, normally functioning TAVR with a mean gradient 9.6 mm hg and mild PVL   He was admitted 12/2021 for near syncope. TTE with marked increase in gradient through AV compared to prior echocardiogram.  He also had sinus bradycardia but with chronotropic response to exertion.  He was not on nodal blocking agents or Aricept.  Carotid US without significant finding. Orthostatic vitals negative but symptomatic from lying to sitting. TEE completed on 12/24/2021 and showed EF 60-65%, mild-mod PVL, mean gradient of 28mhg and acceleration time <1090m Of note, during admission, MRI head showed an acute/subacute infarct in the left parietal lobe. Neurology felt that this was probably an incidental finding. Neurology recommended to continue aspirin and outpatient follow-up with neurology. He had delirium and not oriented to year or date. Per hospitalist, prognosis was poor and outpatient palliative care recommended.  Cardiac CT 12/31/21 showed severe 3 leaflet HALT/HAM and should have a prolonged trial of anticoagulation before any valve in valve TAVR attempted. The moderate PVL with thrombosis both would contribute to rising gradients and valve malfunction. Two areas of dense native valve calcium one in right and one in left sinus leading to non ideal apposition of stent struts to annulus. On TEE the moderate PVL seems to be coming more from the base of the right sinus.   Today he presents  to clinic for follow up. He looks very good. Here with daughter and Claris Che the translator. He mostly complains of being foggy headed. He has dizziness everyday  mostly with positional changes. He lives alone and still drives, but seldomly. Home health aid and family are helping with his ADLS. He has had issues with cognitive decline and started on Aricept which was later stopped. He does not drink alcohol regularly, although his daughter alluded that he might drink more than reported. He has  significantly slowed down. He goes on walks and sometimes cant finish them due to fatigue and then cannot get home. He denies gait instability and falling although the daughter disagrees.   Past Medical History:  Diagnosis Date   BPH (benign prostatic hypertrophy)    Depression    Diverticulosis of colon    GERD (gastroesophageal reflux disease)    Gilbert's syndrome    History of kidney stones    HLD (hyperlipidemia)    HOH (hard of hearing)    refuses to wear his Hearing Aid   Hypertension    Inguinal hernia    right   Internal hemorrhoid    Normal coronary arteries    a. Cath 07/2010: normal coronaries. Felt to have noncardiac CP/SOB at that time possibly r/t anxiety.   RA (rheumatoid arthritis) (HCC)    bilateral hands/ wrist--  seronegative   S/P TAVR (transcatheter aortic valve replacement) 03/27/2018   23 mm Edwards Sapien 3 transcatheter heart valve placed via percutaneous right transfemoral approach    Severe aortic stenosis    Thyroid nodule    noted 11-2009   Wears dentures     Past Surgical History:  Procedure Laterality Date   CARDIAC CATHETERIZATION  07-19-2010  dr Tressia Miners turner   normal coronary arteries,  ef 60%,  moderate aortic stenosis- gradient 41mHg, normal right heart pressure   CARDIOVASCULAR STRESS TEST  05/ 2011   dr sMarlou Porch  normal low risk perfusion study   CATARACT EXTRACTION W/ INTRAOCULAR LENS  IMPLANT, BILATERAL  2013   CATARACT EXTRACTION, BILATERAL     CHOLECYSTECTOMY N/A 10/11/2018   Procedure: LAPAROSCOPIC CHOLECYSTECTOMY WITH INTRAOPERATIVE CHOLANGIOGRAM;  Surgeon: TJovita Kussmaul MD;  [facility];  Service: General;  Laterality: N/A;   COLONOSCOPY  2010  approx   ERCP N/A 09/25/2018   Procedure: ENDOSCOPIC RETROGRADE CHOLANGIOPANCREATOGRAPHY (ERCP);  Surgeon: MClarene Essex MD;  [place] [facility]  Service: Endoscopy;  Laterality: N/A;   INGUINAL HERNIA REPAIR Right 08/23/2018   Procedure: OPEN RIGHT INGUINAL HERNIA REPAIR WITH MESH;   Surgeon: GArmandina Gemma MD;  [facility];  Service: General;  Laterality: Right;   INTRAOPERATIVE TRANSTHORACIC ECHOCARDIOGRAM N/A 03/27/2018   Procedure: INTRAOPERATIVE TRANSTHORACIC ECHOCARDIOGRAM;  Surgeon: MBurnell Blanks MD;  [place] [facility]  Service: Open Heart Surgery;  Laterality: N/A;   REMOVAL OF STONES  09/25/2018   Procedure: REMOVAL OF STONES;  Surgeon: MClarene Essex MD;  [place] [facility]  Service: Endoscopy;;   RIGHT/LEFT HEART CATH AND CORONARY ANGIOGRAPHY N/A 02/06/2018   Procedure: RIGHT/LEFT HEART CATH AND CORONARY ANGIOGRAPHY;  Surgeon: MBurnell Blanks MD;  [facility];  Service: Cardiovascular;  Laterality: N/A;   SPHINCTEROTOMY  09/25/2018   Procedure: SPHINCTEROTOMY;  Surgeon: MClarene Essex MD;  [place] [facility]  Service: Endoscopy;;   TEE WITHOUT CARDIOVERSION N/A 12/24/2021   Procedure: TRANSESOPHAGEAL ECHOCARDIOGRAM (TEE);  Surgeon: BJanina Mayo MD;  [place] [facility]  Service: Cardiovascular;  Laterality: N/A;   THYROID LOBECTOMY Right  05/05/2015   Procedure: RIGHT THYROID LOBECTOMY;  Surgeon: Armandina Gemma, MD;  Location: [place];  Service: General;  Laterality: Right;   TRANSANAL HEMORRHOIDAL DEARTERIALIZATION N/A 04/09/2014   Procedure: TRANSANAL HEMORRHOIDAL DEARTERIALIZATION OF INTERNAL HEMORROIDS;  Surgeon: Leighton Ruff, MD;  Location: [facility];  Service: General;  Laterality: N/A;   TRANSCATHETER AORTIC VALVE REPLACEMENT, TRANSFEMORAL  03/27/2018   TRANSCATHETER AORTIC VALVE REPLACEMENT, TRANSFEMORAL N/A 03/27/2018   Procedure: TRANSCATHETER AORTIC VALVE REPLACEMENT, TRANSFEMORAL. Edwards SAPIEN 3 Transcatheter Heart Valve 73m.;  Surgeon: MBurnell Blanks MD;  [place] [facility]  Service: Open Heart Surgery;  Laterality: N/A;   TRANSTHORACIC ECHOCARDIOGRAM  11-26-2013   moderate focal basal and mild LVH/  ef 619-14%/ grade I diastolic dysfunction/ mild LAE/  moderate calcification with  stenosis AV with mild regurg,  gradients 35 abd 58 mmHg /  mild TR    Current Medications: Outpatient Medications Prior to Visit  Medication Sig Dispense Refill   dutasteride (AVODART) 0.5 MG capsule Take 0.5 mg by mouth daily.     escitalopram (LEXAPRO) 20 MG tablet Take 1 tablet (20 mg total) by mouth daily. (Patient taking differently: Take 10 mg by mouth at bedtime.) 30 tablet 4   losartan (COZAAR) 25 MG tablet Take 25 mg by mouth daily.   3   Multiple Vitamin (MULTIVITAMIN WITH MINERALS) TABS tablet Take 1 tablet by mouth daily.     ondansetron (ZOFRAN) 4 MG tablet Take 1 tablet (4 mg total) by mouth every 8 (eight) hours as needed for nausea or vomiting. 20 tablet 0   pantoprazole (PROTONIX) 40 MG tablet Take 1 tablet (40 mg total) by mouth daily. 30 tablet 0   polyethylene glycol (MIRALAX) 17 g packet Take 17 g by mouth daily as needed for moderate constipation. 14 each 0   Risankizumab-rzaa (SKYRIZI PEN) 150 MG/ML SOAJ Inject 150 mg into the skin as directed. Every 12 weeks for maintenance. 1 mL 3   senna-docusate (SENOKOT-S) 8.6-50 MG tablet Take 1 tablet by mouth 2 (two) times daily.     aspirin 81 MG tablet Take 1 tablet (81 mg total) by mouth daily. (Patient taking differently: Take 81 mg by mouth daily.) 30 tablet    clobetasol cream (TEMOVATE) 07.82% Apply 1 application. topically 2 (two) times daily. (Patient not taking: Reported on 01/12/2022) 30 g 5   Melatonin 5 MG TABS Take 10-15 mg by mouth at bedtime as needed (for sleep). (Patient not taking: Reported on 01/12/2022)     No facility-administered medications prior to visit.     Allergies:   Ativan [lorazepam] and Phenergan [promethazine hcl]   Social History   Socioeconomic History   Marital status: Widowed    Spouse name: Not on file   Number of children: Not on file   Years of education: Not on file   Highest education level: Not on file  Occupational History   Not on file  Tobacco Use   Smoking status: Former     Years: 40.00    Types: Cigarettes    Start date: 06/13/1973    Quit date: 04/03/1981    Years since quitting: 40.8   Smokeless tobacco: Never  Vaping Use   Vaping Use: Never used  Substance and Sexual Activity   Alcohol use: Yes    Comment: occasional   Drug use: No   Sexual activity: Not on file  Other Topics Concern   Not on file  Social History Narrative   Not on file  Social Determinants of Health   Financial Resource Strain: Not on file  Food Insecurity: Not on file  Transportation Needs: Not on file  Physical Activity: Not on file  Stress: Not on file  Social Connections: Not on file     Family History:  The patient's family history includes Breast cancer in his sister; CVA in his father; Diabetes in his brother and brother; Pulmonary embolism (age of onset: 36) in his mother.      ROS:   Please see the history of present illness.    ROS All other systems reviewed and are negative.   PHYSICAL EXAM:   VS:  BP 114/66   Pulse 68   Ht '5\' 8"'$  (1.727 m)   Wt 165 lb 3.2 oz (74.9 kg)   SpO2 96%   BMI 25.12 kg/m    GEN: Well nourished, well developed, in no acute distress HEENT: normal Neck: no JVD or masses Cardiac: RRR; 3/6 systolic murmurs. No rubs, or gallops,no edema  Respiratory:  clear to auscultation bilaterally, normal work of breathing GI: soft, nontender, nondistended, + BS MS: no deformity or atrophy Skin: warm and dry, no rash Neuro:  Alert and Oriented x 3, Strength and sensation are intact Psych: euthymic mood, full affect   Wt Readings from Last 3 Encounters:  01/12/22 165 lb 3.2 oz (74.9 kg)  12/26/21 170 lb 10.2 oz (77.4 kg)  10/04/19 165 lb 5.5 oz (75 kg)      Studies/Labs Reviewed:   EKG:  EKG is NOT ordered today.    Recent Labs: 12/21/2021: TSH 5.610 12/25/2021: ALT 31; BUN 24; Creatinine, Ser 1.07; Hemoglobin 13.7; Magnesium 2.1; Platelets 145; Potassium 4.4; Sodium 138   Lipid Panel    Component Value Date/Time   CHOL 161  12/25/2021 0539   TRIG 61 12/25/2021 0539   HDL 57 12/25/2021 0539   CHOLHDL 2.8 12/25/2021 0539   VLDL 12 12/25/2021 0539   LDLCALC 92 12/25/2021 0539    Additional studies/ records that were reviewed today include:  TAVR OPERATIVE NOTE     Date of Procedure:                03/27/2018   Preoperative Diagnosis:      Severe Aortic Stenosis   Procedure:        Transcatheter Aortic Valve Replacement - Percutaneous Right Transfemoral Approach             Edwards Sapien 3 THV (size 23 mm, model # 9600TFX, serial # [id number])   Co-Surgeons:                        Valentina Gu. Roxy Manns, MD and  Lauree Chandler, MD     Pre-operative Echo Findings: Severe aortic stenosis Normal left ventricular systolic function   Post-operative Echo Findings: Mild paravalvular leak Normal left ventricular systolic function   ______________  Echo 04/25/18 (1 month s/p TAVR)  Study Conclusions   - Left ventricle: The cavity size was normal. There was mild   concentric hypertrophy. Systolic function was normal. The   estimated ejection fraction was in the range of 60% to 65%. Wall   motion was normal; there were no regional wall motion   abnormalities. Doppler parameters are consistent with abnormal   left ventricular relaxation (grade 1 diastolic dysfunction). - Aortic valve: S/P TAVR. A prosthesis was present and functioning   normally. The prosthesis had a normal range of motion. The sewing   ring appeared  normal, had no rocking motion, and showed no   evidence of dehiscence. There was moderate perivalvular   regurgitation. Mean gradient (S): 14 mm Hg. Regurgitation   pressure half-time: 386 ms. - Left atrium: The atrium w as mildly dilated. - Atrial septum: No defect or patent foramen ovale was identified. - Pulmonary arteries: PA peak pressure: 32 mm Hg (S). Impressions: - S/P TAVR. On this study, there is moderate aortic regurgitation   that appears most likel perivalvular in nature. It  was not seen   on prior study. Mean gradient is similar to prior.   __________________   Echo 04/25/19 ( 1year s/p TAVR) IMPRESSIONS   1. Left ventricular ejection fraction, by visual estimation, is 60 to 65%. The left ventricle has normal function. There is no left ventricular hypertrophy.  2. Left ventricular diastolic parameters are consistent with Grade I diastolic dysfunction (impaired relaxation).  3. Elevated left ventricular end-diastolic pressure.  4. Global right ventricle has normal systolic function.The right ventricular size is normal. No increase in right ventricular wall thickness.  5. Left atrial size was normal.  6. Right atrial size was normal.  7. Moderate mitral annular calcification. No evidence of mitral valve regurgitation. No evidence of mitral stenosis.  8. The tricuspid valve is normal in structure. Tricuspid valve regurgitation is trivial.  9. 54m Edwards Sapien bioprosthetic, stented aortic valve (TAVR) valve is present in the aortic position. Mild perivalvular Aortic valve regurgitation is visualized. Aortic valve mean gradient measures 18 mmHg. Aortic valve peak gradient measures 29.4  mmHg. Dimensionless index 0.61. 10. The pulmonic valve was normal in structure. Pulmonic valve regurgitation is trivial. 11. Mildly elevated pulmonary artery systolic pressure. 12. The inferior vena cava is normal in size with greater than 50% respiratory variability, suggesting right atrial pressure of 3 mmHg.  _____________________  Echo 01/12/22 IMPRESSIONS  1. Left ventricular ejection fraction, by estimation, is 70 to 75%. The  left ventricle has hyperdynamic function. The left ventricle has no  regional wall motion abnormalities. There is mild left ventricular  hypertrophy. Left ventricular diastolic  parameters are indeterminate.   2. Right ventricular systolic function is normal. The right ventricular  size is normal. There is normal pulmonary artery systolic  pressure. The  estimated right ventricular systolic pressure is 278.2mmHg.   3. The mitral valve is normal in structure. No evidence of mitral valve  regurgitation. No evidence of mitral stenosis.   4. The inferior vena cava is normal in size with greater than 50%  respiratory variability, suggesting right atrial pressure of 3 mmHg.   5. There is a 23 mm Edwards Sapien prosthetic (TAVR) valve present in the  aortic position. Mild perivalvular leak. Vmax 4.5 m/s, MG 45 mmHg, EOA 1.5  cm^2, DI 0.38, AT 973m Marked increase in gradients through AV compared  to prior echo (1836m on  05/18/21). AT is low, and has increased LVOT gradient, suggesting high flow  state contributing to elevated AV gradients, though not enough to explain  his MG 62m38m Prosthetic valve is not well visualized, would recommend  TEE to better assess etiology of  increased gradients   __________________________  TEE 12/24/21 IMPRESSIONS   1. Left ventricular ejection fraction, by estimation, is 60 to 65%. The  left ventricle has normal function.   2. Right ventricular systolic function is normal. The right ventricular  size is normal.   3. No left atrial/left atrial appendage thrombus was detected.   4. The mitral valve is normal in structure.  No evidence of mitral valve  regurgitation.   5. Mild-moderate paravalvular leak 0.19 cm. Well seated, no dehiscence.  No thrombus. V max 4.8 m/s. mean gradient 60 mmhg. DI 0.25, AT < 100 ms,  EOAi 0.5 cm/m2, concerning for prosthesis-patient mismatch. Aortic valve  regurgitation is not visualized.  There is a 23 mm Sapien prosthetic (TAVR) valve present in the aortic  position. Procedure Date: 03/27/2018.   Conclusion(s)/Recommendation(s): Discussed with primary cardiologist.   ___________________________  CT 12/31/21 IMPRESSION: 1. 23 mm Sapien 3 valve implant 03/27/18. Patient presented climactically with CHF and elevated mean gradient 40 mmHg and moderate PVL.  Study   Suggests that patient has severe 3 leaflet HALT/HAM and should have a prolonged trial of anticoagulation before any valve in valve TAVR attempted. The   Moderate PVL with thrombosis both would contribute to rising gradients and valve malfunction   2. Two areas of dense native valve calcium one in right and one in left sinus leading to non ideal apposition of stent struts to annulus On TEE the moderate   PVL seems to be coming more from the base of the right sinus   3. Both coronary artery ostia originate near the superior margin of the stent with an acceptable VCD for the LM but a small/short VCD for the RCA The coronary heights prior to implant LM shallow 9.1 mm and RCA 11.6 mm   Would not recommend initial strategy of valve in valve TAVR.  ASSESSMENT & PLAN:   Bioprosthetic valve dysfunction: recently admitted for pre syncope and found to have bioprosthetic valve dysfunction 2/2 severe HALT/HAM with a mean gradient of 60 mm hg. Plan was for initiation of [place], however patient was documented to have worsening dementia, heavy alcohol use and frequent falls. After a very long risk and benefit discussion with the patient and daughter, we have elected to proceed with Adrian with Eliquis '5mg'$  BID. He understands the risks of significant bleeding should he have a fall. He has been asked to not drive and completely abstain from alcohol. Of note, he was extremely with it today and did not show any signs of frailty or dementia while in the office today. I will see him back in 3 months with an echo. Plan to stop aspirin.   Chronic diastolic CHF:  appears euvolemic off diuretics.   HTN: BP well controlled today. No changes made.    HLD: continue statin   Medication Adjustments/Labs and Tests Ordered: Current medicines are reviewed at length with the patient today.  Concerns regarding medicines are outlined above.  Medication changes, Labs and Tests ordered today are listed in the Patient  Instructions below. Patient Instructions  Medication Instructions:  Eliquis 5 mg twice a day   *If you need a refill on your cardiac medications before your next appointment, please call your pharmacy*   Lab Work: none If you have labs (blood work) drawn today and your tests are completely normal, you will receive your results only by: [place] (if you have MyChart) OR A paper copy in the mail If you have any lab test that is abnormal or we need to change your treatment, we will call you to review the results.   Testing/Procedures: in 3 months prior to seeing Nell Range PA same day  Your physician has requested that you have an echocardiogram. Echocardiography is a painless test that uses sound waves to create images of your heart. It provides your doctor with information about the size and shape of  your heart and how well your heart's chambers and valves are working. This procedure takes approximately one hour. There are no restrictions for this procedure.    Follow-Up: At [facility], you and your health needs are our priority.  As part of our continuing mission to provide you with exceptional heart care, we have created designated Provider Care Teams.  These Care Teams include your primary Cardiologist (physician) and Advanced Practice Providers (APPs -  Physician Assistants and Nurse Practitioners) who all work together to provide you with the care you need, when you need it.  We recommend signing up for the patient portal called "MyChart".  Sign up information is provided on this After Visit Summary.  MyChart is used to connect with patients for Virtual Visits (Telemedicine).  Patients are able to view lab/test results, encounter notes, upcoming appointments, etc.  Non-urgent messages can be sent to your provider as well.   To learn more about what you can do with MyChart, go to [email or web address].    Your next appointment:   3 month(s) appt already made   The  format for your next appointment:   In Person  Provider:   Nell Range, PA-C    Other Instructions   Important Information About Sugar          Signed, Angelena Form, PA-C  01/12/2022 3:43 PM    [address], [place], [place]  [address] Phone: [phone]; Fax: [phone]

## 2022-01-07 DIAGNOSIS — I130 Hypertensive heart and chronic kidney disease with heart failure and stage 1 through stage 4 chronic kidney disease, or unspecified chronic kidney disease: Secondary | ICD-10-CM | POA: Diagnosis not present

## 2022-01-07 DIAGNOSIS — I69354 Hemiplegia and hemiparesis following cerebral infarction affecting left non-dominant side: Secondary | ICD-10-CM | POA: Diagnosis not present

## 2022-01-07 DIAGNOSIS — E785 Hyperlipidemia, unspecified: Secondary | ICD-10-CM | POA: Diagnosis not present

## 2022-01-07 DIAGNOSIS — I350 Nonrheumatic aortic (valve) stenosis: Secondary | ICD-10-CM | POA: Diagnosis not present

## 2022-01-07 DIAGNOSIS — F1090 Alcohol use, unspecified, uncomplicated: Secondary | ICD-10-CM | POA: Diagnosis not present

## 2022-01-07 DIAGNOSIS — I2510 Atherosclerotic heart disease of native coronary artery without angina pectoris: Secondary | ICD-10-CM | POA: Diagnosis not present

## 2022-01-07 DIAGNOSIS — Z9049 Acquired absence of other specified parts of digestive tract: Secondary | ICD-10-CM | POA: Diagnosis not present

## 2022-01-07 DIAGNOSIS — H9190 Unspecified hearing loss, unspecified ear: Secondary | ICD-10-CM | POA: Diagnosis not present

## 2022-01-07 DIAGNOSIS — Z9841 Cataract extraction status, right eye: Secondary | ICD-10-CM | POA: Diagnosis not present

## 2022-01-07 DIAGNOSIS — K7460 Unspecified cirrhosis of liver: Secondary | ICD-10-CM | POA: Diagnosis not present

## 2022-01-07 DIAGNOSIS — K648 Other hemorrhoids: Secondary | ICD-10-CM | POA: Diagnosis not present

## 2022-01-07 DIAGNOSIS — K5730 Diverticulosis of large intestine without perforation or abscess without bleeding: Secondary | ICD-10-CM | POA: Diagnosis not present

## 2022-01-07 DIAGNOSIS — I5032 Chronic diastolic (congestive) heart failure: Secondary | ICD-10-CM | POA: Diagnosis not present

## 2022-01-07 DIAGNOSIS — Z952 Presence of prosthetic heart valve: Secondary | ICD-10-CM | POA: Diagnosis not present

## 2022-01-07 DIAGNOSIS — Z9842 Cataract extraction status, left eye: Secondary | ICD-10-CM | POA: Diagnosis not present

## 2022-01-07 DIAGNOSIS — Z87891 Personal history of nicotine dependence: Secondary | ICD-10-CM | POA: Diagnosis not present

## 2022-01-07 DIAGNOSIS — M069 Rheumatoid arthritis, unspecified: Secondary | ICD-10-CM | POA: Diagnosis not present

## 2022-01-07 DIAGNOSIS — Z7982 Long term (current) use of aspirin: Secondary | ICD-10-CM | POA: Diagnosis not present

## 2022-01-07 DIAGNOSIS — K219 Gastro-esophageal reflux disease without esophagitis: Secondary | ICD-10-CM | POA: Diagnosis not present

## 2022-01-07 DIAGNOSIS — D696 Thrombocytopenia, unspecified: Secondary | ICD-10-CM | POA: Diagnosis not present

## 2022-01-07 DIAGNOSIS — N182 Chronic kidney disease, stage 2 (mild): Secondary | ICD-10-CM | POA: Diagnosis not present

## 2022-01-07 DIAGNOSIS — F32A Depression, unspecified: Secondary | ICD-10-CM | POA: Diagnosis not present

## 2022-01-11 DIAGNOSIS — H02051 Trichiasis without entropian right upper eyelid: Secondary | ICD-10-CM | POA: Diagnosis not present

## 2022-01-11 DIAGNOSIS — H11001 Unspecified pterygium of right eye: Secondary | ICD-10-CM | POA: Diagnosis not present

## 2022-01-12 ENCOUNTER — Other Ambulatory Visit: Payer: Self-pay

## 2022-01-12 ENCOUNTER — Ambulatory Visit (INDEPENDENT_AMBULATORY_CARE_PROVIDER_SITE_OTHER): Payer: Medicare Other | Admitting: Physician Assistant

## 2022-01-12 VITALS — BP 114/66 | HR 68 | Ht 68.0 in | Wt 165.2 lb

## 2022-01-12 DIAGNOSIS — I5032 Chronic diastolic (congestive) heart failure: Secondary | ICD-10-CM

## 2022-01-12 DIAGNOSIS — E7800 Pure hypercholesterolemia, unspecified: Secondary | ICD-10-CM

## 2022-01-12 DIAGNOSIS — I10 Essential (primary) hypertension: Secondary | ICD-10-CM

## 2022-01-12 DIAGNOSIS — T8209XA Other mechanical complication of heart valve prosthesis, initial encounter: Secondary | ICD-10-CM

## 2022-01-12 DIAGNOSIS — Z952 Presence of prosthetic heart valve: Secondary | ICD-10-CM

## 2022-01-12 MED ORDER — APIXABAN 5 MG PO TABS: 5.0000 mg | ORAL_TABLET | Freq: Two times a day (BID) | ORAL | 3 refills | Status: DC

## 2022-01-12 NOTE — Progress Notes (Signed)
Dora advised and verbalized understanding to have the pt stop his ASA now that he will be taking Eliquis.

## 2022-01-12 NOTE — Patient Instructions (Addendum)
Medication Instructions:  Eliquis 5 mg twice a day   *If you need a refill on your cardiac medications before your next appointment, please call your pharmacy*   Lab Work: none If you have labs (blood work) drawn today and your tests are completely normal, you will receive your results only by: [place] (if you have MyChart) OR A paper copy in the mail If you have any lab test that is abnormal or we need to change your treatment, we will call you to review the results.   Testing/Procedures: in 3 months prior to seeing Nell Range PA same day  Your physician has requested that you have an echocardiogram. Echocardiography is a painless test that uses sound waves to create images of your heart. It provides your doctor with information about the size and shape of your heart and how well your heart's chambers and valves are working. This procedure takes approximately one hour. There are no restrictions for this procedure.    Follow-Up: At [facility], you and your health needs are our priority.  As part of our continuing mission to provide you with exceptional heart care, we have created designated Provider Care Teams.  These Care Teams include your primary Cardiologist (physician) and Advanced Practice Providers (APPs -  Physician Assistants and Nurse Practitioners) who all work together to provide you with the care you need, when you need it.  We recommend signing up for the patient portal called "MyChart".  Sign up information is provided on this After Visit Summary.  MyChart is used to connect with patients for Virtual Visits (Telemedicine).  Patients are able to view lab/test results, encounter notes, upcoming appointments, etc.  Non-urgent messages can be sent to your provider as well.   To learn more about what you can do with MyChart, go to [email or web address].    Your next appointment:   3 month(s) appt already made   The format for your next appointment:   In  Person  Provider:   Nell Range, PA-C    Other Instructions   Important Information About Sugar

## 2022-01-13 DIAGNOSIS — Z952 Presence of prosthetic heart valve: Secondary | ICD-10-CM | POA: Diagnosis not present

## 2022-01-13 DIAGNOSIS — K648 Other hemorrhoids: Secondary | ICD-10-CM | POA: Diagnosis not present

## 2022-01-13 DIAGNOSIS — Z7982 Long term (current) use of aspirin: Secondary | ICD-10-CM | POA: Diagnosis not present

## 2022-01-13 DIAGNOSIS — I350 Nonrheumatic aortic (valve) stenosis: Secondary | ICD-10-CM | POA: Diagnosis not present

## 2022-01-13 DIAGNOSIS — K5730 Diverticulosis of large intestine without perforation or abscess without bleeding: Secondary | ICD-10-CM | POA: Diagnosis not present

## 2022-01-13 DIAGNOSIS — K7460 Unspecified cirrhosis of liver: Secondary | ICD-10-CM | POA: Diagnosis not present

## 2022-01-13 DIAGNOSIS — E785 Hyperlipidemia, unspecified: Secondary | ICD-10-CM | POA: Diagnosis not present

## 2022-01-13 DIAGNOSIS — Z9049 Acquired absence of other specified parts of digestive tract: Secondary | ICD-10-CM | POA: Diagnosis not present

## 2022-01-13 DIAGNOSIS — K219 Gastro-esophageal reflux disease without esophagitis: Secondary | ICD-10-CM | POA: Diagnosis not present

## 2022-01-13 DIAGNOSIS — N182 Chronic kidney disease, stage 2 (mild): Secondary | ICD-10-CM | POA: Diagnosis not present

## 2022-01-13 DIAGNOSIS — Z87891 Personal history of nicotine dependence: Secondary | ICD-10-CM | POA: Diagnosis not present

## 2022-01-13 DIAGNOSIS — I130 Hypertensive heart and chronic kidney disease with heart failure and stage 1 through stage 4 chronic kidney disease, or unspecified chronic kidney disease: Secondary | ICD-10-CM | POA: Diagnosis not present

## 2022-01-13 DIAGNOSIS — M069 Rheumatoid arthritis, unspecified: Secondary | ICD-10-CM | POA: Diagnosis not present

## 2022-01-13 DIAGNOSIS — Z9841 Cataract extraction status, right eye: Secondary | ICD-10-CM | POA: Diagnosis not present

## 2022-01-13 DIAGNOSIS — H9190 Unspecified hearing loss, unspecified ear: Secondary | ICD-10-CM | POA: Diagnosis not present

## 2022-01-13 DIAGNOSIS — I69354 Hemiplegia and hemiparesis following cerebral infarction affecting left non-dominant side: Secondary | ICD-10-CM | POA: Diagnosis not present

## 2022-01-13 DIAGNOSIS — F1090 Alcohol use, unspecified, uncomplicated: Secondary | ICD-10-CM | POA: Diagnosis not present

## 2022-01-13 DIAGNOSIS — D696 Thrombocytopenia, unspecified: Secondary | ICD-10-CM | POA: Diagnosis not present

## 2022-01-13 DIAGNOSIS — F32A Depression, unspecified: Secondary | ICD-10-CM | POA: Diagnosis not present

## 2022-01-13 DIAGNOSIS — Z9842 Cataract extraction status, left eye: Secondary | ICD-10-CM | POA: Diagnosis not present

## 2022-01-13 DIAGNOSIS — I2510 Atherosclerotic heart disease of native coronary artery without angina pectoris: Secondary | ICD-10-CM | POA: Diagnosis not present

## 2022-01-13 DIAGNOSIS — I5032 Chronic diastolic (congestive) heart failure: Secondary | ICD-10-CM | POA: Diagnosis not present

## 2022-01-15 DIAGNOSIS — Z87891 Personal history of nicotine dependence: Secondary | ICD-10-CM | POA: Diagnosis not present

## 2022-01-15 DIAGNOSIS — Z952 Presence of prosthetic heart valve: Secondary | ICD-10-CM | POA: Diagnosis not present

## 2022-01-15 DIAGNOSIS — I5032 Chronic diastolic (congestive) heart failure: Secondary | ICD-10-CM | POA: Diagnosis not present

## 2022-01-15 DIAGNOSIS — H9190 Unspecified hearing loss, unspecified ear: Secondary | ICD-10-CM | POA: Diagnosis not present

## 2022-01-15 DIAGNOSIS — Z9841 Cataract extraction status, right eye: Secondary | ICD-10-CM | POA: Diagnosis not present

## 2022-01-15 DIAGNOSIS — I350 Nonrheumatic aortic (valve) stenosis: Secondary | ICD-10-CM | POA: Diagnosis not present

## 2022-01-15 DIAGNOSIS — I69354 Hemiplegia and hemiparesis following cerebral infarction affecting left non-dominant side: Secondary | ICD-10-CM | POA: Diagnosis not present

## 2022-01-15 DIAGNOSIS — Z7982 Long term (current) use of aspirin: Secondary | ICD-10-CM | POA: Diagnosis not present

## 2022-01-15 DIAGNOSIS — K5730 Diverticulosis of large intestine without perforation or abscess without bleeding: Secondary | ICD-10-CM | POA: Diagnosis not present

## 2022-01-15 DIAGNOSIS — M069 Rheumatoid arthritis, unspecified: Secondary | ICD-10-CM | POA: Diagnosis not present

## 2022-01-15 DIAGNOSIS — I130 Hypertensive heart and chronic kidney disease with heart failure and stage 1 through stage 4 chronic kidney disease, or unspecified chronic kidney disease: Secondary | ICD-10-CM | POA: Diagnosis not present

## 2022-01-15 DIAGNOSIS — D696 Thrombocytopenia, unspecified: Secondary | ICD-10-CM | POA: Diagnosis not present

## 2022-01-15 DIAGNOSIS — F1090 Alcohol use, unspecified, uncomplicated: Secondary | ICD-10-CM | POA: Diagnosis not present

## 2022-01-15 DIAGNOSIS — K648 Other hemorrhoids: Secondary | ICD-10-CM | POA: Diagnosis not present

## 2022-01-15 DIAGNOSIS — Z9842 Cataract extraction status, left eye: Secondary | ICD-10-CM | POA: Diagnosis not present

## 2022-01-15 DIAGNOSIS — K219 Gastro-esophageal reflux disease without esophagitis: Secondary | ICD-10-CM | POA: Diagnosis not present

## 2022-01-15 DIAGNOSIS — E785 Hyperlipidemia, unspecified: Secondary | ICD-10-CM | POA: Diagnosis not present

## 2022-01-15 DIAGNOSIS — Z9049 Acquired absence of other specified parts of digestive tract: Secondary | ICD-10-CM | POA: Diagnosis not present

## 2022-01-15 DIAGNOSIS — I2510 Atherosclerotic heart disease of native coronary artery without angina pectoris: Secondary | ICD-10-CM | POA: Diagnosis not present

## 2022-01-15 DIAGNOSIS — F32A Depression, unspecified: Secondary | ICD-10-CM | POA: Diagnosis not present

## 2022-01-15 DIAGNOSIS — K7460 Unspecified cirrhosis of liver: Secondary | ICD-10-CM | POA: Diagnosis not present

## 2022-01-15 DIAGNOSIS — N182 Chronic kidney disease, stage 2 (mild): Secondary | ICD-10-CM | POA: Diagnosis not present

## 2022-01-22 DIAGNOSIS — F1090 Alcohol use, unspecified, uncomplicated: Secondary | ICD-10-CM | POA: Diagnosis not present

## 2022-01-22 DIAGNOSIS — Z9842 Cataract extraction status, left eye: Secondary | ICD-10-CM | POA: Diagnosis not present

## 2022-01-22 DIAGNOSIS — Z9049 Acquired absence of other specified parts of digestive tract: Secondary | ICD-10-CM | POA: Diagnosis not present

## 2022-01-22 DIAGNOSIS — Z87891 Personal history of nicotine dependence: Secondary | ICD-10-CM | POA: Diagnosis not present

## 2022-01-22 DIAGNOSIS — E785 Hyperlipidemia, unspecified: Secondary | ICD-10-CM | POA: Diagnosis not present

## 2022-01-22 DIAGNOSIS — Z7982 Long term (current) use of aspirin: Secondary | ICD-10-CM | POA: Diagnosis not present

## 2022-01-22 DIAGNOSIS — M069 Rheumatoid arthritis, unspecified: Secondary | ICD-10-CM | POA: Diagnosis not present

## 2022-01-22 DIAGNOSIS — K7460 Unspecified cirrhosis of liver: Secondary | ICD-10-CM | POA: Diagnosis not present

## 2022-01-22 DIAGNOSIS — D696 Thrombocytopenia, unspecified: Secondary | ICD-10-CM | POA: Diagnosis not present

## 2022-01-22 DIAGNOSIS — K219 Gastro-esophageal reflux disease without esophagitis: Secondary | ICD-10-CM | POA: Diagnosis not present

## 2022-01-22 DIAGNOSIS — I69354 Hemiplegia and hemiparesis following cerebral infarction affecting left non-dominant side: Secondary | ICD-10-CM | POA: Diagnosis not present

## 2022-01-22 DIAGNOSIS — I130 Hypertensive heart and chronic kidney disease with heart failure and stage 1 through stage 4 chronic kidney disease, or unspecified chronic kidney disease: Secondary | ICD-10-CM | POA: Diagnosis not present

## 2022-01-22 DIAGNOSIS — I5032 Chronic diastolic (congestive) heart failure: Secondary | ICD-10-CM | POA: Diagnosis not present

## 2022-01-22 DIAGNOSIS — H9190 Unspecified hearing loss, unspecified ear: Secondary | ICD-10-CM | POA: Diagnosis not present

## 2022-01-22 DIAGNOSIS — I350 Nonrheumatic aortic (valve) stenosis: Secondary | ICD-10-CM | POA: Diagnosis not present

## 2022-01-22 DIAGNOSIS — K648 Other hemorrhoids: Secondary | ICD-10-CM | POA: Diagnosis not present

## 2022-01-22 DIAGNOSIS — F32A Depression, unspecified: Secondary | ICD-10-CM | POA: Diagnosis not present

## 2022-01-22 DIAGNOSIS — Z9841 Cataract extraction status, right eye: Secondary | ICD-10-CM | POA: Diagnosis not present

## 2022-01-22 DIAGNOSIS — Z952 Presence of prosthetic heart valve: Secondary | ICD-10-CM | POA: Diagnosis not present

## 2022-01-22 DIAGNOSIS — K5730 Diverticulosis of large intestine without perforation or abscess without bleeding: Secondary | ICD-10-CM | POA: Diagnosis not present

## 2022-01-22 DIAGNOSIS — I2510 Atherosclerotic heart disease of native coronary artery without angina pectoris: Secondary | ICD-10-CM | POA: Diagnosis not present

## 2022-01-22 DIAGNOSIS — N182 Chronic kidney disease, stage 2 (mild): Secondary | ICD-10-CM | POA: Diagnosis not present

## 2022-01-24 ENCOUNTER — Telehealth: Payer: Self-pay

## 2022-01-24 ENCOUNTER — Other Ambulatory Visit: Payer: Medicare Other | Admitting: Family Medicine

## 2022-01-24 DIAGNOSIS — Z515 Encounter for palliative care: Secondary | ICD-10-CM

## 2022-01-24 NOTE — Telephone Encounter (Signed)
(  1:43 pm) PC SW completed a follow-up call to patient's daughter [name] to provide support to her. She advised that patient's doctor submitted an application for CAP-DA services for patient and she had not heard anything back. SW advised her that currently the CAP-DA is behind on processing applications as they are changing to a new system. SW provided a number [phone]) to call to follow-up on the application. SW also provided her contact information where she could call and get more information or support, while providing her with reassurance of  ongoing support from SW and palliative care team.  No other concerns noted.

## 2022-01-28 ENCOUNTER — Ambulatory Visit (INDEPENDENT_AMBULATORY_CARE_PROVIDER_SITE_OTHER): Payer: Medicare Other | Admitting: Cardiology

## 2022-01-28 ENCOUNTER — Encounter: Payer: Self-pay | Admitting: Cardiology

## 2022-01-28 VITALS — BP 120/60 | HR 62 | Ht 68.0 in | Wt 167.0 lb

## 2022-01-28 DIAGNOSIS — T8209XA Other mechanical complication of heart valve prosthesis, initial encounter: Secondary | ICD-10-CM

## 2022-01-28 DIAGNOSIS — I5032 Chronic diastolic (congestive) heart failure: Secondary | ICD-10-CM

## 2022-01-28 NOTE — Progress Notes (Signed)
Cardiology Office Note:    Date:  01/28/2022   ID:  [name], DOB 1927-06-16, MRN [id number]  PCP:  Lajean Manes, MD  Cardiologist:  Candee Furbish, MD  Electrophysiologist:  None   Referring MD: Lajean Manes, MD    History of Present Illness:    [name] is a 88 y.o. male here for the follow-up of TAVR.  He followed up with Angelena Form, PA-C on 01/12/2022. He had recently been admitted for pre syncope and found to have bioprosthetic valve dysfunction due to severe HALT/HAM with a mean gradient of 60 mm hg. Plan was for initiation of [place], however he was documented to have worsening dementia, heavy alcohol use, and frequent falls. After a very long risk and benefit discussion with the patient and daughter, they elected to proceed with [place]. He was started on Eliquis '5mg'$  BID.   Per notes of Angelena Form, PA-C: Cardiac CT 12/31/21 showed severe 3 leaflet HALT/HAM and should have a prolonged trial of anticoagulation before any valve in valve TAVR attempted. The moderate PVL with thrombosis both would contribute to rising gradients and valve malfunction. Two areas of dense native valve calcium one in right and one in left sinus leading to non ideal apposition of stent struts to annulus. On TEE the moderate PVL seems to be coming more from the base of the right sinus.    TAVR valve 23 mm placed on 03/27/2018 with nonobstructive CAD on catheterization 02/06/2018, rheumatoid arthritis on methotrexate here with presurgical evaluation prior to inguinal hernia repair.  Was in hospital on 06/05/2018 with atypical chest discomfort.  Noted on echocardiogram to have moderate TAVR perivalvular leak but was felt to have musculoskeletal discomfort.  Post TAVR via the transfemoral approach did demonstrate a large right inguinal hernia.  This was thought to be potentially hematoma or pseudoaneurysm at first but this was negative.  He had been having some discomfort with his hernia.  Plan was to get  him through at least 3 months of uninterrupted dual antiplatelet therapy with aspirin and Plavix prior to surgery.  He had completed this.  He also has chronic diastolic heart failure which appears stable.  Low-dose Lasix.  Today: He is accompanied by 2 family members who provide the majority of the history. They state he has profound memory loss. Aricept had been discontinued as it caused vomiting.   Previously he often complained of dizziness, a lack of energy, and stated that "his head is clouded." Lately, they report seeing positive changes in his health. He has some gradual improvement, and has not had as many complaints.   For activity he will go on walks. His speed is slower than before, but he has slightly more energy.  He denies any palpitations, chest pain, shortness of breath, or peripheral edema. No syncope, orthopnea, or PND.   Past Medical History:  Diagnosis Date   BPH (benign prostatic hypertrophy)    Depression    Diverticulosis of colon    GERD (gastroesophageal reflux disease)    Gilbert's syndrome    History of kidney stones    HLD (hyperlipidemia)    HOH (hard of hearing)    refuses to wear his Hearing Aid   Hypertension    Inguinal hernia    right   Internal hemorrhoid    Normal coronary arteries    a. Cath 07/2010: normal coronaries. Felt to have noncardiac CP/SOB at that time possibly r/t anxiety.   RA (rheumatoid arthritis) (HCC)    bilateral hands/  wrist--  seronegative   S/P TAVR (transcatheter aortic valve replacement) 03/27/2018   23 mm Edwards Sapien 3 transcatheter heart valve placed via percutaneous right transfemoral approach    Severe aortic stenosis    Thyroid nodule    noted 11-2009   Wears dentures     Past Surgical History:  Procedure Laterality Date   CARDIAC CATHETERIZATION  07-19-2010  dr Tressia Miners turner   normal coronary arteries,  ef 60%,  moderate aortic stenosis- gradient 57mHg, normal right heart pressure   CARDIOVASCULAR STRESS  TEST  05/ 2011   dr sMarlou Porch  normal low risk perfusion study   CATARACT EXTRACTION W/ INTRAOCULAR LENS  IMPLANT, BILATERAL  2013   CATARACT EXTRACTION, BILATERAL     CHOLECYSTECTOMY N/A 10/11/2018   Procedure: LAPAROSCOPIC CHOLECYSTECTOMY WITH INTRAOPERATIVE CHOLANGIOGRAM;  Surgeon: TJovita Kussmaul MD;  [facility];  Service: General;  Laterality: N/A;   COLONOSCOPY  2010  approx   ERCP N/A 09/25/2018   Procedure: ENDOSCOPIC RETROGRADE CHOLANGIOPANCREATOGRAPHY (ERCP);  Surgeon: MClarene Essex MD;  [place] [facility]  Service: Endoscopy;  Laterality: N/A;   INGUINAL HERNIA REPAIR Right 08/23/2018   Procedure: OPEN RIGHT INGUINAL HERNIA REPAIR WITH MESH;  Surgeon: GArmandina Gemma MD;  [facility];  Service: General;  Laterality: Right;   INTRAOPERATIVE TRANSTHORACIC ECHOCARDIOGRAM N/A 03/27/2018   Procedure: INTRAOPERATIVE TRANSTHORACIC ECHOCARDIOGRAM;  Surgeon: MBurnell Blanks MD;  [place] [facility]  Service: Open Heart Surgery;  Laterality: N/A;   REMOVAL OF STONES  09/25/2018   Procedure: REMOVAL OF STONES;  Surgeon: MClarene Essex MD;  [place] [facility]  Service: Endoscopy;;   RIGHT/LEFT HEART CATH AND CORONARY ANGIOGRAPHY N/A 02/06/2018   Procedure: RIGHT/LEFT HEART CATH AND CORONARY ANGIOGRAPHY;  Surgeon: MBurnell Blanks MD;  [facility];  Service: Cardiovascular;  Laterality: N/A;   SPHINCTEROTOMY  09/25/2018   Procedure: SPHINCTEROTOMY;  Surgeon: MClarene Essex MD;  [place] [facility]  Service: Endoscopy;;   TEE WITHOUT CARDIOVERSION N/A 12/24/2021   Procedure: TRANSESOPHAGEAL ECHOCARDIOGRAM (TEE);  Surgeon: BJanina Mayo MD;  [facility]  Service: Cardiovascular;  Laterality: N/A;   THYROID LOBECTOMY Right 05/05/2015   Procedure: RIGHT THYROID LOBECTOMY;  Surgeon: TArmandina Gemma MD;  [place] [facility]  Service: General;  Laterality: Right;   TRANSANAL HEMORRHOIDAL DEARTERIALIZATION N/A 04/09/2014   Procedure: TRANSANAL HEMORRHOIDAL  DEARTERIALIZATION OF INTERNAL HEMORROIDS;  Surgeon: ALeighton Ruff MD;  [facility]  Service: General;  Laterality: N/A;   TRANSCATHETER AORTIC VALVE REPLACEMENT, TRANSFEMORAL  03/27/2018   TRANSCATHETER AORTIC VALVE REPLACEMENT, TRANSFEMORAL N/A 03/27/2018   Procedure: TRANSCATHETER AORTIC VALVE REPLACEMENT, TRANSFEMORAL. Edwards SAPIEN 3 Transcatheter Heart Valve 236m;  Surgeon: McBurnell BlanksMD;  [facility]: [facility];  Laterality: N/A;   TRANSTHORACIC ECHOCARDIOGRAM  11-26-2013   moderate focal basal and mild LVH/  ef 6524-58%/grade I diastolic dysfunction/ mild LAE/  moderate calcification with stenosis AV with mild regurg,  gradients 35 abd 58 mmHg /  mild TR    Current Medications: Current Meds  Medication Sig   apixaban (ELIQUIS) 5 MG TABS tablet Take 1 tablet (5 mg total) by mouth 2 (two) times daily.   clobetasol cream (TEMOVATE) 0.0.99 Apply 1 application. topically 2 (two) times daily.   dutasteride (AVODART) 0.5 MG capsule Take 0.5 mg by mouth daily.   escitalopram (LEXAPRO) 20 MG tablet Take 1 tablet (20 mg total) by mouth daily. (Patient taking differently: Take 10 mg by  mouth at bedtime.)   losartan (COZAAR) 25 MG tablet Take 25 mg by mouth daily.    Melatonin 5 MG TABS Take 10-15 mg by mouth at bedtime as needed (for sleep).   Multiple Vitamin (MULTIVITAMIN WITH MINERALS) TABS tablet Take 1 tablet by mouth daily.   ondansetron (ZOFRAN) 4 MG tablet Take 1 tablet (4 mg total) by mouth every 8 (eight) hours as needed for nausea or vomiting.   pantoprazole (PROTONIX) 40 MG tablet Take 1 tablet (40 mg total) by mouth daily.   polyethylene glycol (MIRALAX) 17 g packet Take 17 g by mouth daily as needed for moderate constipation.   Risankizumab-rzaa (SKYRIZI PEN) 150 MG/ML SOAJ Inject 150 mg into the skin as directed. Every 12 weeks for maintenance.   senna-docusate (SENOKOT-S) 8.6-50 MG tablet Take 1 tablet by mouth 2  (two) times daily.     Allergies:   Ativan [lorazepam] and Phenergan [promethazine hcl]   Social History   Socioeconomic History   Marital status: Widowed    Spouse name: Not on file   Number of children: Not on file   Years of education: Not on file   Highest education level: Not on file  Occupational History   Not on file  Tobacco Use   Smoking status: Former    Years: 40.00    Types: Cigarettes    Start date: 06/13/1973    Quit date: 04/03/1981    Years since quitting: 40.8   Smokeless tobacco: Never  Vaping Use   Vaping Use: Never used  Substance and Sexual Activity   Alcohol use: Yes    Comment: occasional   Drug use: No   Sexual activity: Not on file  Other Topics Concern   Not on file  Social History Narrative   Not on file   Social Determinants of Health   Financial Resource Strain: Not on file  Food Insecurity: Not on file  Transportation Needs: Not on file  Physical Activity: Not on file  Stress: Not on file  Social Connections: Not on file     Family History: The patient's family history includes Breast cancer in his sister; CVA in his father; Diabetes in his brother and brother; Pulmonary embolism (age of onset: 10) in his mother.  ROS:   Please see the history of present illness.    (+) Memory loss All other systems reviewed and are negative.  EKGs/Labs/Other Studies Reviewed:    The following studies were reviewed today: Prior office notes echocardiogram lab work EKG  Coronary CT  12/31/2021: FINDINGS: Patient is post TAVR with a 23 mm Sapien 3 valve Implant date 03/27/18 Post implant there was noted to be a mild PVL The stented valve appears well expanded and well positioned There is severe thickening and bulky thrombosis of all 3 leaflets. (HALT/HAM)   LM VCD (valve to coronary distance) 4.65 mm   RCA VCD (valve to coronary distance)  3.45 mm   The coronary ostia are below the superior margin of the stent struts   There is an area of  dense native valve calcium in the right sinus that limits apposition and is likely the area of moderate PVL seen on TEE   There is also an area of dense native valve calcium in the base of the left cusp that may contribute a 2 nd area of PVL   Sinus: 28-31 mm   STJ 24.5 mm   Ascending thoracic aorta 33 mm   Bovine Arch: 27 mm  Descending thoracic aorta 22 mm   IMPRESSION: 1. 23 mm Sapien 3 valve implant 03/27/18. Patient presented climactically with CHF and elevated mean gradient 40 mmHg and moderate PVL. Study   Suggests that patient has severe 3 leaflet HALT/HAM and should have a prolonged trial of anticoagulation before any valve in valve TAVR attempted. The   Moderate PVL with thrombosis both would contribute to rising gradients and valve malfunction   2. Two areas of dense native valve calcium one in right and one in left sinus leading to non ideal apposition of stent struts to annulus On TEE the moderate   PVL seems to be coming more from the base of the right sinus   3. Both coronary artery ostia originate near the superior margin of the stent with an acceptable VCD for the LM but a small/short VCD for the RCA The coronary heights prior to implant LM shallow 9.1 mm and RCA 11.6 mm   Would not recommend initial strategy of valve in valve TAVR.  CTA Chest/Aorta  12/31/2021: CTA CHEST FINDINGS Cardiovascular: Normal heart size. No pericardial effusion. Normal caliber thoracic aorta with moderate calcified and noncalcified plaque. Prior transcatheter aortic valve replacement. Left main and three-vessel coronary artery calcifications.  AORTA: Minimal Aortic Diameter-30.3 mm Severity of Aortic Calcification-moderate  IMPRESSION: 1. Vascular findings and measurements pertinent to potential TAVR procedure, as detailed above. 2. Prior transcatheter aortic valve replacement. 3. Moderate aortoiliac atherosclerosis. Left main and 3 vessel coronary artery  disease.  TEE  12/24/2021:  1. Left ventricular ejection fraction, by estimation, is 60 to 65%. The  left ventricle has normal function.   2. Right ventricular systolic function is normal. The right ventricular  size is normal.   3. No left atrial/left atrial appendage thrombus was detected.   4. The mitral valve is normal in structure. No evidence of mitral valve  regurgitation.   5. Mild-moderate paravalvular leak 0.19 cm. Well seated, no dehiscence.  No thrombus. V max 4.8 m/s. mean gradient 60 mmhg. DI 0.25, AT < 100 ms,  EOAi 0.5 cm/m2, concerning for prosthesis-patient mismatch. Aortic valve  regurgitation is not visualized.  There is a 23 mm Sapien prosthetic (TAVR) valve present in the aortic  position. Procedure Date: 03/27/2018.   Conclusion(s)/Recommendation(s): Discussed with primary cardiologist.  Bilateral Carotid Doppler  12/21/2021: Summary:  Right Carotid: The extracranial vessels were near-normal with only minimal wall thickening or plaque.   Left Carotid: Velocities in the left ICA are consistent with a 1-39%  stenosis.   Vertebrals:  Bilateral vertebral arteries demonstrate antegrade flow.  Subclavians: Normal flow hemodynamics were seen in bilateral subclavian arteries.   No significant change when compared to prior study 02/05/2018.    EKG:  EKG is personally reviewed. 01/28/2022:  EKG was not ordered. 07/24/2018:  sinus bradycardia 55 no other significant abnormalities.  Recent Labs: 12/21/2021: TSH 5.610 12/25/2021: ALT 31; BUN 24; Creatinine, Ser 1.07; Hemoglobin 13.7; Magnesium 2.1; Platelets 145; Potassium 4.4; Sodium 138   Recent Lipid Panel    Component Value Date/Time   CHOL 161 12/25/2021 0539   TRIG 61 12/25/2021 0539   HDL 57 12/25/2021 0539   CHOLHDL 2.8 12/25/2021 0539   VLDL 12 12/25/2021 0539   LDLCALC 92 12/25/2021 0539    Physical Exam:    VS:  BP 120/60 (BP Location: Left Arm, Patient Position: Sitting, Cuff Size: Normal)   Pulse  62   Ht '5\' 8"'$  (1.727 m)   Wt 167 lb (75.8 kg)  BMI 25.39 kg/m     Wt Readings from Last 3 Encounters:  01/28/22 167 lb (75.8 kg)  01/12/22 165 lb 3.2 oz (74.9 kg)  12/26/21 170 lb 10.2 oz (77.4 kg)     GEN: Well nourished, well developed in no acute distress HEENT: Normal NECK: No JVD; No carotid bruits LYMPHATICS: No lymphadenopathy CARDIAC: RRR, 3/6 systolic murmur, No rubs, no gallops RESPIRATORY:  Clear to auscultation without rales, wheezing or rhonchi  ABDOMEN: Soft, non-tender, non-distended MUSCULOSKELETAL:  No edema; No deformity  SKIN: Warm and dry NEUROLOGIC:  Alert and oriented x 3 PSYCHIATRIC:  Normal affect   ASSESSMENT:    1. Prosthetic valve dysfunction, initial encounter   2. Chronic diastolic CHF (congestive heart failure) (HCC)     PLAN:    In order of problems listed above:  TAVR valve HALT/leaflet thrombosis -On Eliquis. -Clinically improving.  Daughter has noticed a difference. -With underlying memory impairment continue with conservative management strategy.  Chronic diastolic heart failure -Euvolemic.  Avoiding diuretics given his propensity for vasovagal syncope.  Rheumatoid arthritis - Currently on Skyrizi  Left carotid artery plaque/disease -Mild 1 to 39%.  Continue with secondary prevention.  Appreciate assistance with structural heart clinic.  Hospital notes reviewed.  CT reviewed.  We discussed avoiding alcohol/wine, translator present  Follow-up:  1 year.  Medication Adjustments/Labs and Tests Ordered: Current medicines are reviewed at length with the patient today.  Concerns regarding medicines are outlined above.   No orders of the defined types were placed in this encounter.  No orders of the defined types were placed in this encounter.  Patient Instructions  Medication Instructions:  The current medical regimen is effective;  continue present plan and medications.  *If you need a refill on your cardiac medications  before your next appointment, please call your pharmacy*   Follow-Up: At [facility], you and your health needs are our priority.  As part of our continuing mission to provide you with exceptional heart care, we have created designated Provider Care Teams.  These Care Teams include your primary Cardiologist (physician) and Advanced Practice Providers (APPs -  Physician Assistants and Nurse Practitioners) who all work together to provide you with the care you need, when you need it.  We recommend signing up for the patient portal called "MyChart".  Sign up information is provided on this After Visit Summary.  MyChart is used to connect with patients for Virtual Visits (Telemedicine).  Patients are able to view lab/test results, encounter notes, upcoming appointments, etc.  Non-urgent messages can be sent to your provider as well.   To learn more about what you can do with MyChart, go to [email or web address].    Your next appointment:   1 year(s)  The format for your next appointment:   In Person  Provider:   Candee Furbish, MD {          I,Mathew Stumpf,acting as a scribe for Candee Furbish, MD.,have documented all relevant documentation on the behalf of Candee Furbish, MD,as directed by  Candee Furbish, MD while in the presence of Candee Furbish, MD.  I, Candee Furbish, MD, have reviewed all documentation for this visit. The documentation on 01/28/22 for the exam, diagnosis, procedures, and orders are all accurate and complete.   Signed, Candee Furbish, MD  01/28/2022 5:07 PM    [place]

## 2022-01-28 NOTE — Patient Instructions (Signed)
Medication Instructions:  The current medical regimen is effective;  continue present plan and medications.  *If you need a refill on your cardiac medications before your next appointment, please call your pharmacy*   Follow-Up: At [facility], you and your health needs are our priority.  As part of our continuing mission to provide you with exceptional heart care, we have created designated Provider Care Teams.  These Care Teams include your primary Cardiologist (physician) and Advanced Practice Providers (APPs -  Physician Assistants and Nurse Practitioners) who all work together to provide you with the care you need, when you need it.  We recommend signing up for the patient portal called "MyChart".  Sign up information is provided on this After Visit Summary.  MyChart is used to connect with patients for Virtual Visits (Telemedicine).  Patients are able to view lab/test results, encounter notes, upcoming appointments, etc.  Non-urgent messages can be sent to your provider as well.   To learn more about what you can do with MyChart, go to [email or web address].    Your next appointment:   1 year(s)  The format for your next appointment:   In Person  Provider:   Candee Furbish, MD {

## 2022-01-29 DIAGNOSIS — N182 Chronic kidney disease, stage 2 (mild): Secondary | ICD-10-CM | POA: Diagnosis not present

## 2022-01-29 DIAGNOSIS — K5730 Diverticulosis of large intestine without perforation or abscess without bleeding: Secondary | ICD-10-CM | POA: Diagnosis not present

## 2022-01-29 DIAGNOSIS — I130 Hypertensive heart and chronic kidney disease with heart failure and stage 1 through stage 4 chronic kidney disease, or unspecified chronic kidney disease: Secondary | ICD-10-CM | POA: Diagnosis not present

## 2022-01-29 DIAGNOSIS — K7460 Unspecified cirrhosis of liver: Secondary | ICD-10-CM | POA: Diagnosis not present

## 2022-01-29 DIAGNOSIS — Z9842 Cataract extraction status, left eye: Secondary | ICD-10-CM | POA: Diagnosis not present

## 2022-01-29 DIAGNOSIS — F1090 Alcohol use, unspecified, uncomplicated: Secondary | ICD-10-CM | POA: Diagnosis not present

## 2022-01-29 DIAGNOSIS — I5032 Chronic diastolic (congestive) heart failure: Secondary | ICD-10-CM | POA: Diagnosis not present

## 2022-01-29 DIAGNOSIS — F32A Depression, unspecified: Secondary | ICD-10-CM | POA: Diagnosis not present

## 2022-01-29 DIAGNOSIS — I350 Nonrheumatic aortic (valve) stenosis: Secondary | ICD-10-CM | POA: Diagnosis not present

## 2022-01-29 DIAGNOSIS — D696 Thrombocytopenia, unspecified: Secondary | ICD-10-CM | POA: Diagnosis not present

## 2022-01-29 DIAGNOSIS — Z87891 Personal history of nicotine dependence: Secondary | ICD-10-CM | POA: Diagnosis not present

## 2022-01-29 DIAGNOSIS — E785 Hyperlipidemia, unspecified: Secondary | ICD-10-CM | POA: Diagnosis not present

## 2022-01-29 DIAGNOSIS — Z9049 Acquired absence of other specified parts of digestive tract: Secondary | ICD-10-CM | POA: Diagnosis not present

## 2022-01-29 DIAGNOSIS — I69354 Hemiplegia and hemiparesis following cerebral infarction affecting left non-dominant side: Secondary | ICD-10-CM | POA: Diagnosis not present

## 2022-01-29 DIAGNOSIS — Z9841 Cataract extraction status, right eye: Secondary | ICD-10-CM | POA: Diagnosis not present

## 2022-01-29 DIAGNOSIS — K648 Other hemorrhoids: Secondary | ICD-10-CM | POA: Diagnosis not present

## 2022-01-29 DIAGNOSIS — I2510 Atherosclerotic heart disease of native coronary artery without angina pectoris: Secondary | ICD-10-CM | POA: Diagnosis not present

## 2022-01-29 DIAGNOSIS — K219 Gastro-esophageal reflux disease without esophagitis: Secondary | ICD-10-CM | POA: Diagnosis not present

## 2022-01-29 DIAGNOSIS — Z952 Presence of prosthetic heart valve: Secondary | ICD-10-CM | POA: Diagnosis not present

## 2022-01-29 DIAGNOSIS — Z7982 Long term (current) use of aspirin: Secondary | ICD-10-CM | POA: Diagnosis not present

## 2022-01-29 DIAGNOSIS — H9190 Unspecified hearing loss, unspecified ear: Secondary | ICD-10-CM | POA: Diagnosis not present

## 2022-01-29 DIAGNOSIS — M069 Rheumatoid arthritis, unspecified: Secondary | ICD-10-CM | POA: Diagnosis not present

## 2022-02-01 ENCOUNTER — Other Ambulatory Visit: Payer: Self-pay | Admitting: Dermatology

## 2022-02-03 ENCOUNTER — Encounter: Payer: Self-pay | Admitting: Diagnostic Neuroimaging

## 2022-02-03 ENCOUNTER — Ambulatory Visit (INDEPENDENT_AMBULATORY_CARE_PROVIDER_SITE_OTHER): Payer: Medicare Other | Admitting: Diagnostic Neuroimaging

## 2022-02-03 VITALS — BP 139/69 | HR 60 | Ht 66.54 in | Wt 167.0 lb

## 2022-02-03 DIAGNOSIS — F03B0 Unspecified dementia, moderate, without behavioral disturbance, psychotic disturbance, mood disturbance, and anxiety: Secondary | ICD-10-CM

## 2022-02-03 DIAGNOSIS — I63412 Cerebral infarction due to embolism of left middle cerebral artery: Secondary | ICD-10-CM | POA: Diagnosis not present

## 2022-02-03 DIAGNOSIS — G44209 Tension-type headache, unspecified, not intractable: Secondary | ICD-10-CM

## 2022-02-03 NOTE — Progress Notes (Signed)
[facility]  PATIENT: [name] DOB: 23-Nov-1927  REFERRING CLINICIAN: Aline August, MD HISTORY FROM: patient  REASON FOR VISIT: new consult    HISTORICAL  CHIEF COMPLAINT:  Chief Complaint  Patient presents with   Cerebrovascular Accident    RM 7 with daughter [name] and cone interpreter  Pt is well, states he is having headache and dizziness. No other concerns     HISTORY OF PRESENT ILLNESS:   88 year old male with history of dementia, coronary disease, severe aortic stenosis status post TAVR, rheumatoid arthritis, hypertension here for stroke follow-up.  Patient is originally from [place], trained as a TEFL teacher, highly functional and highly successful in his career.  Came to the night states around 1991.  By 2000 he was having some issues with adjustment, depression and memory loss.  This was treated with antidepressant medication by neurologist Dr. Erling Cruz.  He did well for several years.  In the last 1 year he has had significant mental and cognitive decline.  He was diagnosed with dementia by PCP and started on Aricept.  Unfortunately could not tolerate this due to GI side effects.  He lives with his family.  They help him with his medications.  He no longer drives.  He has significant short-term memory deficits.  In July 2023 patient developed lightheadedness, nausea, vomiting, dizziness and weakness.  He was admitted for near syncope.  Had bradycardia in the 50s.  Echocardiogram demonstrates increased gradient across the aortic valve concerning for worsening stenosis.  TEE was concerning for patient valve mismatch.  Due to confusion and delirium he had MRI of the brain.  He had punctate acute to subacute cortical ischemic infarct in the left parietal lobe.  He followed up in clinic and was started on anticoagulation for thrombus on the aortic valve.  Since that time patient is back home.  He is back to baseline.  Does complain of some  intermittent brain fog, headaches.    REVIEW OF SYSTEMS: Full 14 system review of systems performed and negative with exception of: as per HPI.  ALLERGIES: Allergies  Allergen Reactions   Ativan [Lorazepam] Other (See Comments)    hallucinations   Phenergan [Promethazine Hcl] Hypertension    hallucinations    HOME MEDICATIONS: Outpatient Medications Prior to Visit  Medication Sig Dispense Refill   apixaban (ELIQUIS) 5 MG TABS tablet Take 1 tablet (5 mg total) by mouth 2 (two) times daily. 180 tablet 3   clobetasol cream (TEMOVATE) 1.44 % Apply 1 application. topically 2 (two) times daily. (Patient taking differently: Apply 1 application  topically as needed.) 30 g 5   dutasteride (AVODART) 0.5 MG capsule Take 0.5 mg by mouth daily.     escitalopram (LEXAPRO) 20 MG tablet Take 1 tablet (20 mg total) by mouth daily. (Patient taking differently: Take 10 mg by mouth at bedtime. 10 mg) 30 tablet 4   losartan (COZAAR) 25 MG tablet Take 25 mg by mouth daily.   3   Melatonin 5 MG TABS Take 10-15 mg by mouth as needed (for sleep).     Multiple Vitamin (MULTIVITAMIN WITH MINERALS) TABS tablet Take 1 tablet by mouth daily.     ondansetron (ZOFRAN) 4 MG tablet Take 1 tablet (4 mg total) by mouth every 8 (eight) hours as needed for nausea or vomiting. 20 tablet 0   pantoprazole (PROTONIX) 40 MG tablet Take 1 tablet (40 mg total) by mouth daily. 30 tablet 0   polyethylene glycol (MIRALAX) 17 g packet Take  17 g by mouth daily as needed for moderate constipation. (Patient taking differently: Take 17 g by mouth as needed for moderate constipation.) 14 each 0   senna-docusate (SENOKOT-S) 8.6-50 MG tablet Take 1 tablet by mouth 2 (two) times daily. (Patient taking differently: Take 1 tablet by mouth as needed.)     SKYRIZI PEN 150 MG/ML SOAJ INJECT '150MG'$  SUBCUTANEOUSLY EVERY 12 WEEKS AS DIRECTED. 1 mL 10   No facility-administered medications prior to visit.    PAST MEDICAL HISTORY: Past Medical  History:  Diagnosis Date   BPH (benign prostatic hypertrophy)    Depression    Diverticulosis of colon    GERD (gastroesophageal reflux disease)    Gilbert's syndrome    History of kidney stones    HLD (hyperlipidemia)    HOH (hard of hearing)    refuses to wear his Hearing Aid   Hypertension    Inguinal hernia    right   Internal hemorrhoid    Normal coronary arteries    a. Cath 07/2010: normal coronaries. Felt to have noncardiac CP/SOB at that time possibly r/t anxiety.   RA (rheumatoid arthritis) (HCC)    bilateral hands/ wrist--  seronegative   S/P TAVR (transcatheter aortic valve replacement) 03/27/2018   23 mm Edwards Sapien 3 transcatheter heart valve placed via percutaneous right transfemoral approach    Severe aortic stenosis    Thyroid nodule    noted 11-2009   Wears dentures     PAST SURGICAL HISTORY: Past Surgical History:  Procedure Laterality Date   CARDIAC CATHETERIZATION  07-19-2010  dr Tressia Miners turner   normal coronary arteries,  ef 60%,  moderate aortic stenosis- gradient 55mHg, normal right heart pressure   CARDIOVASCULAR STRESS TEST  05/ 2011   dr sMarlou Porch  normal low risk perfusion study   CATARACT EXTRACTION W/ INTRAOCULAR LENS  IMPLANT, BILATERAL  2013   CATARACT EXTRACTION, BILATERAL     CHOLECYSTECTOMY N/A 10/11/2018   Procedure: LAPAROSCOPIC CHOLECYSTECTOMY WITH INTRAOPERATIVE CHOLANGIOGRAM;  Surgeon: TJovita Kussmaul MD;  [facility];  Service: General;  Laterality: N/A;   COLONOSCOPY  2010  approx   ERCP N/A 09/25/2018   Procedure: ENDOSCOPIC RETROGRADE CHOLANGIOPANCREATOGRAPHY (ERCP);  Surgeon: MClarene Essex MD;  [facility]  Service: Endoscopy;  Laterality: N/A;   INGUINAL HERNIA REPAIR Right 08/23/2018   Procedure: OPEN RIGHT INGUINAL HERNIA REPAIR WITH MESH;  Surgeon: GArmandina Gemma MD;  [facility];  Service: General;  Laterality: Right;   INTRAOPERATIVE TRANSTHORACIC ECHOCARDIOGRAM N/A 03/27/2018   Procedure: INTRAOPERATIVE  TRANSTHORACIC ECHOCARDIOGRAM;  Surgeon: MBurnell Blanks MD;  [facility]  Service: Open Heart Surgery;  Laterality: N/A;   REMOVAL OF STONES  09/25/2018   Procedure: REMOVAL OF STONES;  Surgeon: MClarene Essex MD;  [place] [facility]  Service: Endoscopy;;   RIGHT/LEFT HEART CATH AND CORONARY ANGIOGRAPHY N/A 02/06/2018   Procedure: RIGHT/LEFT HEART CATH AND CORONARY ANGIOGRAPHY;  Surgeon: MBurnell Blanks MD;  [facility];  Service: Cardiovascular;  Laterality: N/A;   SPHINCTEROTOMY  09/25/2018   Procedure: SPHINCTEROTOMY;  Surgeon: MClarene Essex MD;  [facility]  Service: Endoscopy;;   TEE WITHOUT CARDIOVERSION N/A 12/24/2021   Procedure: TRANSESOPHAGEAL ECHOCARDIOGRAM (TEE);  Surgeon: BJanina Mayo MD;  [facility] [facility]  Service: Cardiovascular;  Laterality: N/A;   THYROID LOBECTOMY Right 05/05/2015   Procedure: RIGHT THYROID LOBECTOMY;  Surgeon: TArmandina Gemma MD;  [facility]  Service: General;  Laterality: Right;   TRANSANAL HEMORRHOIDAL DEARTERIALIZATION  N/A 04/09/2014   Procedure: TRANSANAL HEMORRHOIDAL DEARTERIALIZATION OF INTERNAL HEMORROIDS;  Surgeon: Leighton Ruff, MD;  Location: [facility];  Service: General;  Laterality: N/A;   TRANSCATHETER AORTIC VALVE REPLACEMENT, TRANSFEMORAL  03/27/2018   TRANSCATHETER AORTIC VALVE REPLACEMENT, TRANSFEMORAL N/A 03/27/2018   Procedure: TRANSCATHETER AORTIC VALVE REPLACEMENT, TRANSFEMORAL. Edwards SAPIEN 3 Transcatheter Heart Valve 30m.;  Surgeon: MBurnell Blanks MD;  [facility] [facility]  Service: Open Heart Surgery;  Laterality: N/A;   TRANSTHORACIC ECHOCARDIOGRAM  11-26-2013   moderate focal basal and mild LVH/  ef 691-63%/ grade I diastolic dysfunction/ mild LAE/  moderate calcification with stenosis AV with mild regurg,  gradients 35 abd 58 mmHg /  mild TR    FAMILY HISTORY: Family History  Problem Relation Age of Onset   Pulmonary embolism Mother 445       caused by complications of surgery   CVA Father    Diabetes Brother    Diabetes Brother    Breast cancer Sister     SOCIAL HISTORY: Social History   Socioeconomic History   Marital status: Widowed    Spouse name: Not on file   Number of children: Not on file   Years of education: Not on file   Highest education level: Not on file  Occupational History   Not on file  Tobacco Use   Smoking status: Former    Years: 40.00    Types: Cigarettes    Start date: 06/13/1973    Quit date: 04/03/1981    Years since quitting: 40.8   Smokeless tobacco: Never  Vaping Use   Vaping Use: Never used  Substance and Sexual Activity   Alcohol use: Yes    Comment: occasional   Drug use: No   Sexual activity: Not on file  Other Topics Concern   Not on file  Social History Narrative   Not on file   Social Determinants of Health   Financial Resource Strain: Not on file  Food Insecurity: Not on file  Transportation Needs: Not on file  Physical Activity: Not on file  Stress: Not on file  Social Connections: Not on file  Intimate Partner Violence: Not on file     PHYSICAL EXAM  GENERAL EXAM/CONSTITUTIONAL: Vitals:  Vitals:   02/03/22 1336  BP: 139/69  Pulse: 60  Weight: 167 lb (75.8 kg)  Height: 5' 6.54" (1.69 m)   Body mass index is 26.52 kg/m. Wt Readings from Last 3 Encounters:  02/03/22 167 lb (75.8 kg)  01/28/22 167 lb (75.8 kg)  01/12/22 165 lb 3.2 oz (74.9 kg)   Patient is in no distress; well developed, nourished and groomed; neck is supple  CARDIOVASCULAR: Examination of carotid arteries is normal; no carotid bruits Regular rate and rhythm, MILD SYSTOLIC MURMUR Examination of peripheral vascular system by observation and palpation is normal  EYES: Ophthalmoscopic exam of optic discs and posterior segments is normal; no papilledema or hemorrhages No results found.  MUSCULOSKELETAL: Gait, strength, tone, movements noted in Neurologic exam  below  NEUROLOGIC: MENTAL STATUS:      No data to display         awake, alert, oriented to person recent and remote memory intact normal attention and concentration language fluent, comprehension intact, naming intact fund of knowledge appropriate  CRANIAL NERVE:  2nd - no papilledema on fundoscopic exam 2nd, 3rd, 4th, 6th - pupils equal and reactive to light, visual fields full to confrontation, extraocular muscles intact, no nystagmus 5th - facial sensation  symmetric 7th - facial strength symmetric 8th - hearing intact 9th - palate elevates symmetrically, uvula midline 11th - shoulder shrug symmetric 12th - tongue protrusion midline  MOTOR:  normal bulk and tone, full strength in the BUE, BLE  SENSORY:  normal and symmetric to light touch  COORDINATION:  finger-nose-finger, fine finger movements normal  REFLEXES:  deep tendon reflexes present and symmetric  GAIT/STATION:  narrow based gait     DIAGNOSTIC DATA (LABS, IMAGING, TESTING) - I reviewed patient records, labs, notes, testing and imaging myself where available.  Lab Results  Component Value Date   WBC 6.1 12/25/2021   HGB 13.7 12/25/2021   HCT 42.4 12/25/2021   MCV 90.0 12/25/2021   PLT 145 (L) 12/25/2021      Component Value Date/Time   NA 138 12/25/2021 0539   K 4.4 12/25/2021 0539   CL 104 12/25/2021 0539   CO2 28 12/25/2021 0539   GLUCOSE 122 (H) 12/25/2021 0539   BUN 24 (H) 12/25/2021 0539   CREATININE 1.07 12/25/2021 0539   CALCIUM 8.9 12/25/2021 0539   PROT 6.2 (L) 12/25/2021 0539   ALBUMIN 3.4 (L) 12/25/2021 0539   AST 26 12/25/2021 0539   ALT 31 12/25/2021 0539   ALKPHOS 51 12/25/2021 0539   BILITOT 1.2 12/25/2021 0539   GFRNONAA >60 12/25/2021 0539   GFRAA >60 10/05/2019 0837   Lab Results  Component Value Date   CHOL 161 12/25/2021   HDL 57 12/25/2021   LDLCALC 92 12/25/2021   TRIG 61 12/25/2021   CHOLHDL 2.8 12/25/2021   Lab Results  Component Value Date    HGBA1C 6.0 (H) 12/25/2021   No results found for: "VITAMINB12" Lab Results  Component Value Date   TSH 5.610 (H) 12/21/2021    12/24/21 MRI brain [I reviewed images myself and agree with interpretation. -VRP]  1. Punctate cortical acute/subacute infarct in the left parietal lobe. 2. Moderate chronic white matter disease. 3. Moderate parenchymal volume loss with medial temporal lobe predominance.  12/25/21 MRA head  - Normal MRA circle-of-Willis without evidence for significant proximal stenosis, aneurysm, or branch vessel occlusion.    ASSESSMENT AND PLAN  88 y.o. year old male here with:   Dx:  1. Cerebrovascular accident (CVA) due to embolism of left middle cerebral artery ([place])   2. Moderate dementia without behavioral disturbance, psychotic disturbance, mood disturbance, or anxiety, unspecified dementia type ([place])   3. Tension headache       PLAN:   STROKE PREVENTION - continue eliquis (started for thrombus on heart valve) - continue BP control  DEMENTIA (moderate without behavior changes; some depression and memory issues since ~2000; but now more dementia type changes since 2022) - safety / supervision issues reviewed - daily physical activity / exercise (at least 15-30 minutes) - eat more plants / vegetables - increase social activities, brain stimulation, games, puzzles, hobbies, crafts, arts, music - aim for at least 7-8 hours sleep per night (or more) - avoid smoking and alcohol - needs help with medications, finances; cannot be alone; no driving - follow up with palliative care home visit  [place] - continue tylenol as needed  Return for return to PCP, pending if symptoms worsen or fail to improve.  I spent 64 minutes of face-to-face and non-face-to-face time with patient.  This included previsit chart review, lab review, study review, order entry, electronic health record documentation, patient education.     Penni Bombard, MD  8/93/7342, 8:76 PM Certified in Neurology,  Neurophysiology and Neuroimaging  [facility] [address] [place], [place] [address] [phone]

## 2022-02-03 NOTE — Patient Instructions (Signed)
STROKE PREVENTION - continue eliquis (started for thrombus on heart valve) - continue BP control  DEMENTIA (moderate without behavior changes; some depression and memory issues since ~2000; but now more dementia type changes since 2022) - safety / supervision issues reviewed - daily physical activity / exercise (at least 15-30 minutes) - eat more plants / vegetables - increase social activities, brain stimulation, games, puzzles, hobbies, crafts, arts, music - aim for at least 7-8 hours sleep per night (or more) - avoid smoking and alcohol - needs help with medications, finances; cannot be alone; no driving - follow up with palliative care home visit  [name] - continue tylenol as needed

## 2022-02-12 DIAGNOSIS — K219 Gastro-esophageal reflux disease without esophagitis: Secondary | ICD-10-CM | POA: Diagnosis not present

## 2022-02-12 DIAGNOSIS — I69354 Hemiplegia and hemiparesis following cerebral infarction affecting left non-dominant side: Secondary | ICD-10-CM | POA: Diagnosis not present

## 2022-02-12 DIAGNOSIS — H9190 Unspecified hearing loss, unspecified ear: Secondary | ICD-10-CM | POA: Diagnosis not present

## 2022-02-12 DIAGNOSIS — K5730 Diverticulosis of large intestine without perforation or abscess without bleeding: Secondary | ICD-10-CM | POA: Diagnosis not present

## 2022-02-12 DIAGNOSIS — N182 Chronic kidney disease, stage 2 (mild): Secondary | ICD-10-CM | POA: Diagnosis not present

## 2022-02-12 DIAGNOSIS — I2510 Atherosclerotic heart disease of native coronary artery without angina pectoris: Secondary | ICD-10-CM | POA: Diagnosis not present

## 2022-02-12 DIAGNOSIS — Z87891 Personal history of nicotine dependence: Secondary | ICD-10-CM | POA: Diagnosis not present

## 2022-02-12 DIAGNOSIS — Z952 Presence of prosthetic heart valve: Secondary | ICD-10-CM | POA: Diagnosis not present

## 2022-02-12 DIAGNOSIS — K648 Other hemorrhoids: Secondary | ICD-10-CM | POA: Diagnosis not present

## 2022-02-12 DIAGNOSIS — E785 Hyperlipidemia, unspecified: Secondary | ICD-10-CM | POA: Diagnosis not present

## 2022-02-12 DIAGNOSIS — Z9049 Acquired absence of other specified parts of digestive tract: Secondary | ICD-10-CM | POA: Diagnosis not present

## 2022-02-12 DIAGNOSIS — F1090 Alcohol use, unspecified, uncomplicated: Secondary | ICD-10-CM | POA: Diagnosis not present

## 2022-02-12 DIAGNOSIS — I350 Nonrheumatic aortic (valve) stenosis: Secondary | ICD-10-CM | POA: Diagnosis not present

## 2022-02-12 DIAGNOSIS — Z9841 Cataract extraction status, right eye: Secondary | ICD-10-CM | POA: Diagnosis not present

## 2022-02-12 DIAGNOSIS — M069 Rheumatoid arthritis, unspecified: Secondary | ICD-10-CM | POA: Diagnosis not present

## 2022-02-12 DIAGNOSIS — I130 Hypertensive heart and chronic kidney disease with heart failure and stage 1 through stage 4 chronic kidney disease, or unspecified chronic kidney disease: Secondary | ICD-10-CM | POA: Diagnosis not present

## 2022-02-12 DIAGNOSIS — Z9842 Cataract extraction status, left eye: Secondary | ICD-10-CM | POA: Diagnosis not present

## 2022-02-12 DIAGNOSIS — I5032 Chronic diastolic (congestive) heart failure: Secondary | ICD-10-CM | POA: Diagnosis not present

## 2022-02-12 DIAGNOSIS — Z7982 Long term (current) use of aspirin: Secondary | ICD-10-CM | POA: Diagnosis not present

## 2022-02-12 DIAGNOSIS — K7460 Unspecified cirrhosis of liver: Secondary | ICD-10-CM | POA: Diagnosis not present

## 2022-02-12 DIAGNOSIS — D696 Thrombocytopenia, unspecified: Secondary | ICD-10-CM | POA: Diagnosis not present

## 2022-02-12 DIAGNOSIS — F32A Depression, unspecified: Secondary | ICD-10-CM | POA: Diagnosis not present

## 2022-02-16 ENCOUNTER — Other Ambulatory Visit: Payer: Medicare Other | Admitting: Family Medicine

## 2022-02-16 VITALS — BP 160/72 | HR 66 | Resp 16 | Wt 169.0 lb

## 2022-02-16 DIAGNOSIS — Z515 Encounter for palliative care: Secondary | ICD-10-CM

## 2022-02-16 DIAGNOSIS — R198 Other specified symptoms and signs involving the digestive system and abdomen: Secondary | ICD-10-CM | POA: Diagnosis not present

## 2022-02-16 DIAGNOSIS — F0390 Unspecified dementia without behavioral disturbance: Secondary | ICD-10-CM

## 2022-02-16 NOTE — Progress Notes (Signed)
[facility] Consult Note Telephone: [phone]  Fax: [phone]    Date of encounter: 02/16/22 9:13 AM PATIENT NAME: [name] [address] [place] [address]   [phone] (home)  DOB: 10-Jul-1927 MRN: [id number] PRIMARY CARE PROVIDER:    Lajean Manes, MD,  [place] [place] [address] [phone]  REFERRING PROVIDER:   Lajean Manes, MD [address] [place],  [place] [address] [phone]  RESPONSIBLE PARTY:    Contact Information     Name Relation Home Work [place] Daughter [phone]  [phone]   Gregary [place] Daughter [phone]  [phone]        I met face to face with patient and daughter [name] in his home.  Daughter [name] served as Astronomer. Palliative Care was asked to follow this patient by consultation request of  Lajean Manes, MD to address advance care planning and complex medical decision making. This is a follow up visit   ASSESSMENT , SYMPTOM MANAGEMENT AND PLAN / RECOMMENDATIONS:  Dementia without behavioral disturbance FAST 7 score 6e Intolerance of ACH inhibitor Aricept with worsening symptoms  Difficulty swallowing pills Encouraged with larger pills to crush if not extended release and mix with applesauce or pudding to improve swallow. Follow with fluids.  Palliative Care Encounter Discussed options on MOST with pt and daughter, granddaughter in attendance. Scanned completed and signed MOST into [facility] on EMR    Advance Care Planning/Goals of Care: Goals include to maximize quality of life and symptom management. Patient and health care surrogate gave their permission to discuss. Our advance care planning conversation included a discussion about:    The value and importance of advance care planning  Exploration of personal, cultural or spiritual beliefs that might influence medical decisions-patient's granddaughter is an  ER physician in [place] Exploration of goals of care in the event of a sudden injury or illness  Identification of a healthcare agent-daughter [name] is health care POA Review and creation of an advance directive document-MOST Decision not to resuscitate or to de-escalate disease focused treatments due to poor prognosis. CODE STATUS MOST created 02/16/22: DNR/DNI with limited additional intervention Use of antibiotics and IV fluids on a case by case, time limited basis No feeding tube.       Follow up Palliative Care Visit: Palliative care will continue to follow for complex medical decision making, advance care planning, and clarification of goals. Return 4 weeks or prn.   This visit was coded based on medical decision making (MDM).  PPS: 60%  HOSPICE ELIGIBILITY/DIAGNOSIS: TBD  Chief Complaint:  Palliative Care is following for chronic medical management in setting of dementia  HISTORY OF PRESENT ILLNESS:  [name] is a 88 y.o. 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  Daughter provides translation.  Will only drink caffeinated tea.  Eating ok and was losing small amounts of weight. He worked with PT and gained 11 lbs in 2 weeks with more SOB.  He has had TAVR and developed a clot and is on Eliquis. No falls.  Doesn't c/o CP, gets fatigued easily.  He has "foggy head and headache" and stopped having nausea and vomiting. Cant manage medications and has trouble deciding what to eat. Headache will get better when  he lays down and dozes. Tylenol may or may not help. Not having nausea currently.  Aricept made his symptoms worse. Urge incontinence intermittently and intermittent bowel incontinence.  Denies orthopnea  and PND. Occasionally has constipation but has senokot that helps.  Has napped some during the day.   History obtained from review of EMR, discussion with daughter serving as interpreter for Mr. Vineyard.   01/02/22:  2d echo:  EF 60-65%, mild-moderate perivalvular leak with concern for prosthesis-patient mismatch. No left atrial or left atrial appendage thrombus.  No mitral/pulmonic or tricuspid regurgitation.   I reviewed EMR for available labs, medications, imaging, studies and related documents. Records reviewed and summarized above.   ROS General: NAD EYES: denies vision changes ENMT: denies dysphagia except for occasional difficulty with swallowing pills, HOH Cardiovascular: denies chest pain, endorses DOE Pulmonary: denies cough, denies SOB Abdomen: endorses good appetite, endorses occasional constipation and incontinence of bowel GU: denies dysuria, endorses incontinence of urine MSK:  denies increased weakness,  no falls reported Skin: denies rashes or wounds Neurological: denies pain, denies insomnia Psych: Endorses positive mood Heme/lymph/immuno: denies bruises, abnormal bleeding  Physical Exam: Current and past weights: 02/12/22 weight was 167 lbs on home scale Constitutional: NAD General: WN, WD EENT:  HOH CV: S1S2, RRR with LUSB murmur, no LE edema Pulmonary: CTAB except bibasilar crackles worse in LLL, no increased work of breathing, no cough, room air Abdomen: normo-active BS + 4 quadrants, soft and non tender, no ascites MSK: no sarcopenia, moves all extremities, ambulatory Skin: warm and dry, no rashes or wounds on visible skin Neuro:  no generalized weakness,  noted cognitive impairment Psych: non-anxious affect, A and O x 2 Hem/lymph/immuno: no widespread bruising   Thank you for the opportunity to participate in the care of Mr. Hoffman.  The palliative care team will continue to follow. Please call our office at [phone] if we can be of additional  assistance.   Marijo Conception, FNP -C  COVID-19 PATIENT SCREENING TOOL Asked and negative response unless otherwise noted:   Have you had symptoms of covid, tested positive or been in contact with someone with symptoms/positive test in the past 5-10 days?  No

## 2022-02-18 DIAGNOSIS — R0689 Other abnormalities of breathing: Secondary | ICD-10-CM | POA: Diagnosis not present

## 2022-02-18 DIAGNOSIS — G309 Alzheimer's disease, unspecified: Secondary | ICD-10-CM | POA: Diagnosis not present

## 2022-02-18 DIAGNOSIS — R0600 Dyspnea, unspecified: Secondary | ICD-10-CM | POA: Diagnosis not present

## 2022-02-18 DIAGNOSIS — I350 Nonrheumatic aortic (valve) stenosis: Secondary | ICD-10-CM | POA: Diagnosis not present

## 2022-02-18 DIAGNOSIS — L4050 Arthropathic psoriasis, unspecified: Secondary | ICD-10-CM | POA: Diagnosis not present

## 2022-02-18 DIAGNOSIS — I10 Essential (primary) hypertension: Secondary | ICD-10-CM | POA: Diagnosis not present

## 2022-02-18 DIAGNOSIS — Z23 Encounter for immunization: Secondary | ICD-10-CM | POA: Diagnosis not present

## 2022-02-20 ENCOUNTER — Encounter: Payer: Self-pay | Admitting: Family Medicine

## 2022-02-20 DIAGNOSIS — Z515 Encounter for palliative care: Secondary | ICD-10-CM | POA: Insufficient documentation

## 2022-02-20 DIAGNOSIS — R198 Other specified symptoms and signs involving the digestive system and abdomen: Secondary | ICD-10-CM | POA: Insufficient documentation

## 2022-02-23 ENCOUNTER — Telehealth: Payer: Self-pay | Admitting: Family Medicine

## 2022-02-23 DIAGNOSIS — Z515 Encounter for palliative care: Secondary | ICD-10-CM

## 2022-02-23 NOTE — Telephone Encounter (Signed)
Left vm asking to reschedule appointment to either the week before 03/11/22 at 9 am or the week after October 13th 9 am.  Provided number for call back.  Damaris Hippo FNP-C

## 2022-02-26 DIAGNOSIS — Z9841 Cataract extraction status, right eye: Secondary | ICD-10-CM | POA: Diagnosis not present

## 2022-02-26 DIAGNOSIS — F1090 Alcohol use, unspecified, uncomplicated: Secondary | ICD-10-CM | POA: Diagnosis not present

## 2022-02-26 DIAGNOSIS — M069 Rheumatoid arthritis, unspecified: Secondary | ICD-10-CM | POA: Diagnosis not present

## 2022-02-26 DIAGNOSIS — Z952 Presence of prosthetic heart valve: Secondary | ICD-10-CM | POA: Diagnosis not present

## 2022-02-26 DIAGNOSIS — K5730 Diverticulosis of large intestine without perforation or abscess without bleeding: Secondary | ICD-10-CM | POA: Diagnosis not present

## 2022-02-26 DIAGNOSIS — I2510 Atherosclerotic heart disease of native coronary artery without angina pectoris: Secondary | ICD-10-CM | POA: Diagnosis not present

## 2022-02-26 DIAGNOSIS — Z9049 Acquired absence of other specified parts of digestive tract: Secondary | ICD-10-CM | POA: Diagnosis not present

## 2022-02-26 DIAGNOSIS — K219 Gastro-esophageal reflux disease without esophagitis: Secondary | ICD-10-CM | POA: Diagnosis not present

## 2022-02-26 DIAGNOSIS — E785 Hyperlipidemia, unspecified: Secondary | ICD-10-CM | POA: Diagnosis not present

## 2022-02-26 DIAGNOSIS — I69354 Hemiplegia and hemiparesis following cerebral infarction affecting left non-dominant side: Secondary | ICD-10-CM | POA: Diagnosis not present

## 2022-02-26 DIAGNOSIS — I5032 Chronic diastolic (congestive) heart failure: Secondary | ICD-10-CM | POA: Diagnosis not present

## 2022-02-26 DIAGNOSIS — H9190 Unspecified hearing loss, unspecified ear: Secondary | ICD-10-CM | POA: Diagnosis not present

## 2022-02-26 DIAGNOSIS — K7460 Unspecified cirrhosis of liver: Secondary | ICD-10-CM | POA: Diagnosis not present

## 2022-02-26 DIAGNOSIS — Z7982 Long term (current) use of aspirin: Secondary | ICD-10-CM | POA: Diagnosis not present

## 2022-02-26 DIAGNOSIS — K648 Other hemorrhoids: Secondary | ICD-10-CM | POA: Diagnosis not present

## 2022-02-26 DIAGNOSIS — Z87891 Personal history of nicotine dependence: Secondary | ICD-10-CM | POA: Diagnosis not present

## 2022-02-26 DIAGNOSIS — F32A Depression, unspecified: Secondary | ICD-10-CM | POA: Diagnosis not present

## 2022-02-26 DIAGNOSIS — I130 Hypertensive heart and chronic kidney disease with heart failure and stage 1 through stage 4 chronic kidney disease, or unspecified chronic kidney disease: Secondary | ICD-10-CM | POA: Diagnosis not present

## 2022-02-26 DIAGNOSIS — Z9842 Cataract extraction status, left eye: Secondary | ICD-10-CM | POA: Diagnosis not present

## 2022-02-26 DIAGNOSIS — I350 Nonrheumatic aortic (valve) stenosis: Secondary | ICD-10-CM | POA: Diagnosis not present

## 2022-02-26 DIAGNOSIS — N182 Chronic kidney disease, stage 2 (mild): Secondary | ICD-10-CM | POA: Diagnosis not present

## 2022-02-26 DIAGNOSIS — D696 Thrombocytopenia, unspecified: Secondary | ICD-10-CM | POA: Diagnosis not present

## 2022-03-03 DIAGNOSIS — E7800 Pure hypercholesterolemia, unspecified: Secondary | ICD-10-CM | POA: Diagnosis not present

## 2022-03-03 DIAGNOSIS — I10 Essential (primary) hypertension: Secondary | ICD-10-CM | POA: Diagnosis not present

## 2022-03-03 DIAGNOSIS — K219 Gastro-esophageal reflux disease without esophagitis: Secondary | ICD-10-CM | POA: Diagnosis not present

## 2022-03-05 DIAGNOSIS — I130 Hypertensive heart and chronic kidney disease with heart failure and stage 1 through stage 4 chronic kidney disease, or unspecified chronic kidney disease: Secondary | ICD-10-CM | POA: Diagnosis not present

## 2022-03-05 DIAGNOSIS — I69354 Hemiplegia and hemiparesis following cerebral infarction affecting left non-dominant side: Secondary | ICD-10-CM | POA: Diagnosis not present

## 2022-03-05 DIAGNOSIS — F1090 Alcohol use, unspecified, uncomplicated: Secondary | ICD-10-CM | POA: Diagnosis not present

## 2022-03-05 DIAGNOSIS — Z9841 Cataract extraction status, right eye: Secondary | ICD-10-CM | POA: Diagnosis not present

## 2022-03-05 DIAGNOSIS — Z9181 History of falling: Secondary | ICD-10-CM | POA: Diagnosis not present

## 2022-03-05 DIAGNOSIS — K5730 Diverticulosis of large intestine without perforation or abscess without bleeding: Secondary | ICD-10-CM | POA: Diagnosis not present

## 2022-03-05 DIAGNOSIS — H9190 Unspecified hearing loss, unspecified ear: Secondary | ICD-10-CM | POA: Diagnosis not present

## 2022-03-05 DIAGNOSIS — I2510 Atherosclerotic heart disease of native coronary artery without angina pectoris: Secondary | ICD-10-CM | POA: Diagnosis not present

## 2022-03-05 DIAGNOSIS — Z952 Presence of prosthetic heart valve: Secondary | ICD-10-CM | POA: Diagnosis not present

## 2022-03-05 DIAGNOSIS — K219 Gastro-esophageal reflux disease without esophagitis: Secondary | ICD-10-CM | POA: Diagnosis not present

## 2022-03-05 DIAGNOSIS — Z7982 Long term (current) use of aspirin: Secondary | ICD-10-CM | POA: Diagnosis not present

## 2022-03-05 DIAGNOSIS — I5032 Chronic diastolic (congestive) heart failure: Secondary | ICD-10-CM | POA: Diagnosis not present

## 2022-03-05 DIAGNOSIS — Z87891 Personal history of nicotine dependence: Secondary | ICD-10-CM | POA: Diagnosis not present

## 2022-03-05 DIAGNOSIS — K648 Other hemorrhoids: Secondary | ICD-10-CM | POA: Diagnosis not present

## 2022-03-05 DIAGNOSIS — F32A Depression, unspecified: Secondary | ICD-10-CM | POA: Diagnosis not present

## 2022-03-05 DIAGNOSIS — Z9049 Acquired absence of other specified parts of digestive tract: Secondary | ICD-10-CM | POA: Diagnosis not present

## 2022-03-05 DIAGNOSIS — Z9842 Cataract extraction status, left eye: Secondary | ICD-10-CM | POA: Diagnosis not present

## 2022-03-05 DIAGNOSIS — F0393 Unspecified dementia, unspecified severity, with mood disturbance: Secondary | ICD-10-CM | POA: Diagnosis not present

## 2022-03-05 DIAGNOSIS — N182 Chronic kidney disease, stage 2 (mild): Secondary | ICD-10-CM | POA: Diagnosis not present

## 2022-03-05 DIAGNOSIS — M069 Rheumatoid arthritis, unspecified: Secondary | ICD-10-CM | POA: Diagnosis not present

## 2022-03-05 DIAGNOSIS — E785 Hyperlipidemia, unspecified: Secondary | ICD-10-CM | POA: Diagnosis not present

## 2022-03-05 DIAGNOSIS — D696 Thrombocytopenia, unspecified: Secondary | ICD-10-CM | POA: Diagnosis not present

## 2022-03-05 DIAGNOSIS — K7460 Unspecified cirrhosis of liver: Secondary | ICD-10-CM | POA: Diagnosis not present

## 2022-03-12 DIAGNOSIS — K5730 Diverticulosis of large intestine without perforation or abscess without bleeding: Secondary | ICD-10-CM | POA: Diagnosis not present

## 2022-03-12 DIAGNOSIS — K648 Other hemorrhoids: Secondary | ICD-10-CM | POA: Diagnosis not present

## 2022-03-12 DIAGNOSIS — H9190 Unspecified hearing loss, unspecified ear: Secondary | ICD-10-CM | POA: Diagnosis not present

## 2022-03-12 DIAGNOSIS — I130 Hypertensive heart and chronic kidney disease with heart failure and stage 1 through stage 4 chronic kidney disease, or unspecified chronic kidney disease: Secondary | ICD-10-CM | POA: Diagnosis not present

## 2022-03-12 DIAGNOSIS — Z9181 History of falling: Secondary | ICD-10-CM | POA: Diagnosis not present

## 2022-03-12 DIAGNOSIS — N182 Chronic kidney disease, stage 2 (mild): Secondary | ICD-10-CM | POA: Diagnosis not present

## 2022-03-12 DIAGNOSIS — I5032 Chronic diastolic (congestive) heart failure: Secondary | ICD-10-CM | POA: Diagnosis not present

## 2022-03-12 DIAGNOSIS — F1090 Alcohol use, unspecified, uncomplicated: Secondary | ICD-10-CM | POA: Diagnosis not present

## 2022-03-12 DIAGNOSIS — D696 Thrombocytopenia, unspecified: Secondary | ICD-10-CM | POA: Diagnosis not present

## 2022-03-12 DIAGNOSIS — K7460 Unspecified cirrhosis of liver: Secondary | ICD-10-CM | POA: Diagnosis not present

## 2022-03-12 DIAGNOSIS — I2510 Atherosclerotic heart disease of native coronary artery without angina pectoris: Secondary | ICD-10-CM | POA: Diagnosis not present

## 2022-03-12 DIAGNOSIS — Z9049 Acquired absence of other specified parts of digestive tract: Secondary | ICD-10-CM | POA: Diagnosis not present

## 2022-03-12 DIAGNOSIS — E785 Hyperlipidemia, unspecified: Secondary | ICD-10-CM | POA: Diagnosis not present

## 2022-03-12 DIAGNOSIS — Z9841 Cataract extraction status, right eye: Secondary | ICD-10-CM | POA: Diagnosis not present

## 2022-03-12 DIAGNOSIS — Z87891 Personal history of nicotine dependence: Secondary | ICD-10-CM | POA: Diagnosis not present

## 2022-03-12 DIAGNOSIS — F0393 Unspecified dementia, unspecified severity, with mood disturbance: Secondary | ICD-10-CM | POA: Diagnosis not present

## 2022-03-12 DIAGNOSIS — F32A Depression, unspecified: Secondary | ICD-10-CM | POA: Diagnosis not present

## 2022-03-12 DIAGNOSIS — Z9842 Cataract extraction status, left eye: Secondary | ICD-10-CM | POA: Diagnosis not present

## 2022-03-12 DIAGNOSIS — I69354 Hemiplegia and hemiparesis following cerebral infarction affecting left non-dominant side: Secondary | ICD-10-CM | POA: Diagnosis not present

## 2022-03-12 DIAGNOSIS — K219 Gastro-esophageal reflux disease without esophagitis: Secondary | ICD-10-CM | POA: Diagnosis not present

## 2022-03-12 DIAGNOSIS — Z952 Presence of prosthetic heart valve: Secondary | ICD-10-CM | POA: Diagnosis not present

## 2022-03-12 DIAGNOSIS — M069 Rheumatoid arthritis, unspecified: Secondary | ICD-10-CM | POA: Diagnosis not present

## 2022-03-12 DIAGNOSIS — Z7982 Long term (current) use of aspirin: Secondary | ICD-10-CM | POA: Diagnosis not present

## 2022-03-14 DIAGNOSIS — K5730 Diverticulosis of large intestine without perforation or abscess without bleeding: Secondary | ICD-10-CM | POA: Diagnosis not present

## 2022-03-14 DIAGNOSIS — K219 Gastro-esophageal reflux disease without esophagitis: Secondary | ICD-10-CM | POA: Diagnosis not present

## 2022-03-14 DIAGNOSIS — I69354 Hemiplegia and hemiparesis following cerebral infarction affecting left non-dominant side: Secondary | ICD-10-CM | POA: Diagnosis not present

## 2022-03-14 DIAGNOSIS — Z7982 Long term (current) use of aspirin: Secondary | ICD-10-CM | POA: Diagnosis not present

## 2022-03-14 DIAGNOSIS — Z952 Presence of prosthetic heart valve: Secondary | ICD-10-CM | POA: Diagnosis not present

## 2022-03-14 DIAGNOSIS — F1090 Alcohol use, unspecified, uncomplicated: Secondary | ICD-10-CM | POA: Diagnosis not present

## 2022-03-14 DIAGNOSIS — Z9842 Cataract extraction status, left eye: Secondary | ICD-10-CM | POA: Diagnosis not present

## 2022-03-14 DIAGNOSIS — I130 Hypertensive heart and chronic kidney disease with heart failure and stage 1 through stage 4 chronic kidney disease, or unspecified chronic kidney disease: Secondary | ICD-10-CM | POA: Diagnosis not present

## 2022-03-14 DIAGNOSIS — K648 Other hemorrhoids: Secondary | ICD-10-CM | POA: Diagnosis not present

## 2022-03-14 DIAGNOSIS — K7460 Unspecified cirrhosis of liver: Secondary | ICD-10-CM | POA: Diagnosis not present

## 2022-03-14 DIAGNOSIS — I5032 Chronic diastolic (congestive) heart failure: Secondary | ICD-10-CM | POA: Diagnosis not present

## 2022-03-14 DIAGNOSIS — E785 Hyperlipidemia, unspecified: Secondary | ICD-10-CM | POA: Diagnosis not present

## 2022-03-14 DIAGNOSIS — Z9181 History of falling: Secondary | ICD-10-CM | POA: Diagnosis not present

## 2022-03-14 DIAGNOSIS — Z87891 Personal history of nicotine dependence: Secondary | ICD-10-CM | POA: Diagnosis not present

## 2022-03-14 DIAGNOSIS — Z9841 Cataract extraction status, right eye: Secondary | ICD-10-CM | POA: Diagnosis not present

## 2022-03-14 DIAGNOSIS — I2510 Atherosclerotic heart disease of native coronary artery without angina pectoris: Secondary | ICD-10-CM | POA: Diagnosis not present

## 2022-03-14 DIAGNOSIS — D696 Thrombocytopenia, unspecified: Secondary | ICD-10-CM | POA: Diagnosis not present

## 2022-03-14 DIAGNOSIS — N182 Chronic kidney disease, stage 2 (mild): Secondary | ICD-10-CM | POA: Diagnosis not present

## 2022-03-14 DIAGNOSIS — Z9049 Acquired absence of other specified parts of digestive tract: Secondary | ICD-10-CM | POA: Diagnosis not present

## 2022-03-14 DIAGNOSIS — M069 Rheumatoid arthritis, unspecified: Secondary | ICD-10-CM | POA: Diagnosis not present

## 2022-03-14 DIAGNOSIS — F0393 Unspecified dementia, unspecified severity, with mood disturbance: Secondary | ICD-10-CM | POA: Diagnosis not present

## 2022-03-14 DIAGNOSIS — F32A Depression, unspecified: Secondary | ICD-10-CM | POA: Diagnosis not present

## 2022-03-14 DIAGNOSIS — H9190 Unspecified hearing loss, unspecified ear: Secondary | ICD-10-CM | POA: Diagnosis not present

## 2022-03-18 ENCOUNTER — Other Ambulatory Visit: Payer: Medicare Other | Admitting: Family Medicine

## 2022-03-25 ENCOUNTER — Other Ambulatory Visit: Payer: Medicare Other | Admitting: Family Medicine

## 2022-03-25 VITALS — BP 138/78 | HR 76 | Resp 18

## 2022-03-25 DIAGNOSIS — L4050 Arthropathic psoriasis, unspecified: Secondary | ICD-10-CM

## 2022-03-25 DIAGNOSIS — F0390 Unspecified dementia without behavioral disturbance: Secondary | ICD-10-CM

## 2022-03-25 NOTE — Progress Notes (Signed)
[facility] Consult Note Telephone: [phone]  Fax: [phone]    Date of encounter: 03/25/22 9:27 AM PATIENT NAME: [name] [address] [place] [address]   [phone] (home)  DOB: [birth year] MRN: [id number] PRIMARY CARE PROVIDER:    Lajean Manes, MD,  [address] [address] [phone]  REFERRING PROVIDER:   Lajean Manes, MD [address],   [address] [phone]  RESPONSIBLE PARTY:    Contact Information     Name Relation Home Work [name] Daughter [phone]  [phone]   [name] Daughter [phone]  [phone]        I met face to face with patient and daughter in his home. Daughter, [name] served as interpreter for patient and provider.  Palliative Care was asked to follow this patient by consultation request of  Lajean Manes, MD to address advance care planning and complex medical decision making. This is a follow up visit   ASSESSMENT , SYMPTOM MANAGEMENT AND PLAN / RECOMMENDATIONS:   Dementia without behavioral disturbance Fast 7 Score 6e Unable to tolerate Aricept with worsening sx. Reversing days and nights for sleep cycle-try to avoid daytime nap Beginning swallowing issues with pills improved with swallow strategies, continue to monitor  2.    Psoriatic arthritis of multiple joints Check with providers to ensure transfer of medical records from old practice to new to permit continuation of Skyrizi.  Advance Care Planning/Goals of Care: Goals include to maximize quality of life and symptom management.  Identification of a healthcare agent-daughter, [name]   CODE STATUS: MOST as of 02/16/22: DNR/DNI with limited additional intervention Use of antibiotics and IV fluids on a case by case, time limited basis No feeding tube.      Follow up Palliative Care Visit: Palliative care will continue to follow for  complex medical decision making, advance care planning, and clarification of goals. Return 4 weeks or prn.   This visit was coded based on medical decision making (MDM).  PPS: 60%  HOSPICE ELIGIBILITY/DIAGNOSIS: TBD  Chief Complaint:  Palliative Care is continuing to follow patient for chronic medical management in setting of dementia.   HISTORY OF PRESENT ILLNESS:  [name] is a 88 y.o. 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  Daughter provides translation.   Dr Ginger Carne is the new Dermatologist at [facility] that manages patient's psoriatic arthritis and skin problems.  Multiple DMARDs were ineffective in mitigating symptoms.Psorriatic arthritis and RA. Mild cirrhosis, caused him to be taken off MTX, tried others and did not help until put on Skyrizi Q 3 months. He was taken off statin and Ibuprofen.  He is seeing another male PCP in the practice with Dr Felipa Eth.  Needs help to see if Dr Sharla Kidney medical records were transferred.   Pt has been reversing days and nights.  No falls. Denies CP, SOB.  Has some nausea but not as much as before, appetite improved. No constipation. Occasional cough/choking after eating or drinking. Has had driver's privilege revoked by [facility] [employer] which has been extremely difficult for him to understand and cope.    History obtained from review of EMR, discussion with daughter and/or [name].   I reviewed EMR and there were no new available  labs, medications, imaging, studies or related documents since last visit.   ROS General: NAD EYES: denies vision changes ENMT: denies dysphagia Cardiovascular: denies chest pain, denies DOE Pulmonary: denies cough, denies increased  SOB Abdomen: endorses good appetite, denies constipation, endorses incontinence of bowel GU: denies dysuria, endorses incontinence of urine MSK:  denies increased weakness, no falls reported Skin: denies rashes or wounds Neurological: denies pain, denies insomnia Psych: Endorses mood lability since driving privileges were revoked, Heme/lymph/immuno: denies bruises, abnormal bleeding  Physical Exam: Current and past weights: 169 lbs as of 02/16/22 Constitutional: NAD General: WN, WD ENMT: intact hearing, oral mucous membranes moist, dentition intact CV: S1S2, RRR with LUSB early systolic murmur, no LE edema Pulmonary: CTAB, no increased work of breathing, no cough, room air Abdomen: normo-active BS + 4 quadrants, soft and non tender MSK: no sarcopenia, moves all extremities, ambulatory Skin: warm and dry, no rashes or wounds on visible skin Neuro:  no generalized weakness,  noted cognitive impairment Psych: non-anxious affect,  intermittent down episodes recently, denies depression, A and O x 2 Hem/lymph/immuno: no widespread bruising   Thank you for the opportunity to participate in the care of [name].  The palliative care team will continue to follow. Please call our office at [phone] if we can be of additional assistance.   Marijo Conception, FNP -C  COVID-19 PATIENT SCREENING TOOL Asked and negative response unless otherwise noted:   Have you had symptoms of covid, tested positive or been in contact with someone with symptoms/positive test in the past 5-10 days?  No

## 2022-03-26 DIAGNOSIS — Z87891 Personal history of nicotine dependence: Secondary | ICD-10-CM | POA: Diagnosis not present

## 2022-03-26 DIAGNOSIS — Z952 Presence of prosthetic heart valve: Secondary | ICD-10-CM | POA: Diagnosis not present

## 2022-03-26 DIAGNOSIS — E785 Hyperlipidemia, unspecified: Secondary | ICD-10-CM | POA: Diagnosis not present

## 2022-03-26 DIAGNOSIS — Z9181 History of falling: Secondary | ICD-10-CM | POA: Diagnosis not present

## 2022-03-26 DIAGNOSIS — I2510 Atherosclerotic heart disease of native coronary artery without angina pectoris: Secondary | ICD-10-CM | POA: Diagnosis not present

## 2022-03-26 DIAGNOSIS — K648 Other hemorrhoids: Secondary | ICD-10-CM | POA: Diagnosis not present

## 2022-03-26 DIAGNOSIS — Z9841 Cataract extraction status, right eye: Secondary | ICD-10-CM | POA: Diagnosis not present

## 2022-03-26 DIAGNOSIS — K5730 Diverticulosis of large intestine without perforation or abscess without bleeding: Secondary | ICD-10-CM | POA: Diagnosis not present

## 2022-03-26 DIAGNOSIS — K219 Gastro-esophageal reflux disease without esophagitis: Secondary | ICD-10-CM | POA: Diagnosis not present

## 2022-03-26 DIAGNOSIS — Z9049 Acquired absence of other specified parts of digestive tract: Secondary | ICD-10-CM | POA: Diagnosis not present

## 2022-03-26 DIAGNOSIS — F1090 Alcohol use, unspecified, uncomplicated: Secondary | ICD-10-CM | POA: Diagnosis not present

## 2022-03-26 DIAGNOSIS — F32A Depression, unspecified: Secondary | ICD-10-CM | POA: Diagnosis not present

## 2022-03-26 DIAGNOSIS — H9190 Unspecified hearing loss, unspecified ear: Secondary | ICD-10-CM | POA: Diagnosis not present

## 2022-03-26 DIAGNOSIS — I69354 Hemiplegia and hemiparesis following cerebral infarction affecting left non-dominant side: Secondary | ICD-10-CM | POA: Diagnosis not present

## 2022-03-26 DIAGNOSIS — Z7982 Long term (current) use of aspirin: Secondary | ICD-10-CM | POA: Diagnosis not present

## 2022-03-26 DIAGNOSIS — N182 Chronic kidney disease, stage 2 (mild): Secondary | ICD-10-CM | POA: Diagnosis not present

## 2022-03-26 DIAGNOSIS — M069 Rheumatoid arthritis, unspecified: Secondary | ICD-10-CM | POA: Diagnosis not present

## 2022-03-26 DIAGNOSIS — I130 Hypertensive heart and chronic kidney disease with heart failure and stage 1 through stage 4 chronic kidney disease, or unspecified chronic kidney disease: Secondary | ICD-10-CM | POA: Diagnosis not present

## 2022-03-26 DIAGNOSIS — I5032 Chronic diastolic (congestive) heart failure: Secondary | ICD-10-CM | POA: Diagnosis not present

## 2022-03-26 DIAGNOSIS — F0393 Unspecified dementia, unspecified severity, with mood disturbance: Secondary | ICD-10-CM | POA: Diagnosis not present

## 2022-03-26 DIAGNOSIS — D696 Thrombocytopenia, unspecified: Secondary | ICD-10-CM | POA: Diagnosis not present

## 2022-03-26 DIAGNOSIS — Z9842 Cataract extraction status, left eye: Secondary | ICD-10-CM | POA: Diagnosis not present

## 2022-03-26 DIAGNOSIS — K7460 Unspecified cirrhosis of liver: Secondary | ICD-10-CM | POA: Diagnosis not present

## 2022-04-03 ENCOUNTER — Encounter: Payer: Self-pay | Admitting: Family Medicine

## 2022-04-03 DIAGNOSIS — L4050 Arthropathic psoriasis, unspecified: Secondary | ICD-10-CM | POA: Insufficient documentation

## 2022-04-09 DIAGNOSIS — E785 Hyperlipidemia, unspecified: Secondary | ICD-10-CM | POA: Diagnosis not present

## 2022-04-09 DIAGNOSIS — Z9049 Acquired absence of other specified parts of digestive tract: Secondary | ICD-10-CM | POA: Diagnosis not present

## 2022-04-09 DIAGNOSIS — I130 Hypertensive heart and chronic kidney disease with heart failure and stage 1 through stage 4 chronic kidney disease, or unspecified chronic kidney disease: Secondary | ICD-10-CM | POA: Diagnosis not present

## 2022-04-09 DIAGNOSIS — Z952 Presence of prosthetic heart valve: Secondary | ICD-10-CM | POA: Diagnosis not present

## 2022-04-09 DIAGNOSIS — Z87891 Personal history of nicotine dependence: Secondary | ICD-10-CM | POA: Diagnosis not present

## 2022-04-09 DIAGNOSIS — K7460 Unspecified cirrhosis of liver: Secondary | ICD-10-CM | POA: Diagnosis not present

## 2022-04-09 DIAGNOSIS — I69354 Hemiplegia and hemiparesis following cerebral infarction affecting left non-dominant side: Secondary | ICD-10-CM | POA: Diagnosis not present

## 2022-04-09 DIAGNOSIS — K648 Other hemorrhoids: Secondary | ICD-10-CM | POA: Diagnosis not present

## 2022-04-09 DIAGNOSIS — I5032 Chronic diastolic (congestive) heart failure: Secondary | ICD-10-CM | POA: Diagnosis not present

## 2022-04-09 DIAGNOSIS — Z9181 History of falling: Secondary | ICD-10-CM | POA: Diagnosis not present

## 2022-04-09 DIAGNOSIS — K219 Gastro-esophageal reflux disease without esophagitis: Secondary | ICD-10-CM | POA: Diagnosis not present

## 2022-04-09 DIAGNOSIS — H9190 Unspecified hearing loss, unspecified ear: Secondary | ICD-10-CM | POA: Diagnosis not present

## 2022-04-09 DIAGNOSIS — Z9842 Cataract extraction status, left eye: Secondary | ICD-10-CM | POA: Diagnosis not present

## 2022-04-09 DIAGNOSIS — N182 Chronic kidney disease, stage 2 (mild): Secondary | ICD-10-CM | POA: Diagnosis not present

## 2022-04-09 DIAGNOSIS — F1090 Alcohol use, unspecified, uncomplicated: Secondary | ICD-10-CM | POA: Diagnosis not present

## 2022-04-09 DIAGNOSIS — M069 Rheumatoid arthritis, unspecified: Secondary | ICD-10-CM | POA: Diagnosis not present

## 2022-04-09 DIAGNOSIS — F0393 Unspecified dementia, unspecified severity, with mood disturbance: Secondary | ICD-10-CM | POA: Diagnosis not present

## 2022-04-09 DIAGNOSIS — K5730 Diverticulosis of large intestine without perforation or abscess without bleeding: Secondary | ICD-10-CM | POA: Diagnosis not present

## 2022-04-09 DIAGNOSIS — Z9841 Cataract extraction status, right eye: Secondary | ICD-10-CM | POA: Diagnosis not present

## 2022-04-09 DIAGNOSIS — Z7982 Long term (current) use of aspirin: Secondary | ICD-10-CM | POA: Diagnosis not present

## 2022-04-09 DIAGNOSIS — I2510 Atherosclerotic heart disease of native coronary artery without angina pectoris: Secondary | ICD-10-CM | POA: Diagnosis not present

## 2022-04-09 DIAGNOSIS — D696 Thrombocytopenia, unspecified: Secondary | ICD-10-CM | POA: Diagnosis not present

## 2022-04-09 DIAGNOSIS — F32A Depression, unspecified: Secondary | ICD-10-CM | POA: Diagnosis not present

## 2022-04-23 DIAGNOSIS — Z87891 Personal history of nicotine dependence: Secondary | ICD-10-CM | POA: Diagnosis not present

## 2022-04-23 DIAGNOSIS — I69354 Hemiplegia and hemiparesis following cerebral infarction affecting left non-dominant side: Secondary | ICD-10-CM | POA: Diagnosis not present

## 2022-04-23 DIAGNOSIS — I2510 Atherosclerotic heart disease of native coronary artery without angina pectoris: Secondary | ICD-10-CM | POA: Diagnosis not present

## 2022-04-23 DIAGNOSIS — N182 Chronic kidney disease, stage 2 (mild): Secondary | ICD-10-CM | POA: Diagnosis not present

## 2022-04-23 DIAGNOSIS — I130 Hypertensive heart and chronic kidney disease with heart failure and stage 1 through stage 4 chronic kidney disease, or unspecified chronic kidney disease: Secondary | ICD-10-CM | POA: Diagnosis not present

## 2022-04-23 DIAGNOSIS — D696 Thrombocytopenia, unspecified: Secondary | ICD-10-CM | POA: Diagnosis not present

## 2022-04-23 DIAGNOSIS — M069 Rheumatoid arthritis, unspecified: Secondary | ICD-10-CM | POA: Diagnosis not present

## 2022-04-23 DIAGNOSIS — F1090 Alcohol use, unspecified, uncomplicated: Secondary | ICD-10-CM | POA: Diagnosis not present

## 2022-04-23 DIAGNOSIS — Z9842 Cataract extraction status, left eye: Secondary | ICD-10-CM | POA: Diagnosis not present

## 2022-04-23 DIAGNOSIS — Z7982 Long term (current) use of aspirin: Secondary | ICD-10-CM | POA: Diagnosis not present

## 2022-04-23 DIAGNOSIS — Z9049 Acquired absence of other specified parts of digestive tract: Secondary | ICD-10-CM | POA: Diagnosis not present

## 2022-04-23 DIAGNOSIS — F32A Depression, unspecified: Secondary | ICD-10-CM | POA: Diagnosis not present

## 2022-04-23 DIAGNOSIS — H9190 Unspecified hearing loss, unspecified ear: Secondary | ICD-10-CM | POA: Diagnosis not present

## 2022-04-23 DIAGNOSIS — Z952 Presence of prosthetic heart valve: Secondary | ICD-10-CM | POA: Diagnosis not present

## 2022-04-23 DIAGNOSIS — E785 Hyperlipidemia, unspecified: Secondary | ICD-10-CM | POA: Diagnosis not present

## 2022-04-23 DIAGNOSIS — K5730 Diverticulosis of large intestine without perforation or abscess without bleeding: Secondary | ICD-10-CM | POA: Diagnosis not present

## 2022-04-23 DIAGNOSIS — F0393 Unspecified dementia, unspecified severity, with mood disturbance: Secondary | ICD-10-CM | POA: Diagnosis not present

## 2022-04-23 DIAGNOSIS — K219 Gastro-esophageal reflux disease without esophagitis: Secondary | ICD-10-CM | POA: Diagnosis not present

## 2022-04-23 DIAGNOSIS — I5032 Chronic diastolic (congestive) heart failure: Secondary | ICD-10-CM | POA: Diagnosis not present

## 2022-04-23 DIAGNOSIS — K7460 Unspecified cirrhosis of liver: Secondary | ICD-10-CM | POA: Diagnosis not present

## 2022-04-23 DIAGNOSIS — Z9841 Cataract extraction status, right eye: Secondary | ICD-10-CM | POA: Diagnosis not present

## 2022-04-23 DIAGNOSIS — Z9181 History of falling: Secondary | ICD-10-CM | POA: Diagnosis not present

## 2022-04-23 DIAGNOSIS — K648 Other hemorrhoids: Secondary | ICD-10-CM | POA: Diagnosis not present

## 2022-04-28 NOTE — Progress Notes (Signed)
[facility]                                       Cardiology Office Note    Date:  04/29/2022   ID:  [name], DOB Jan 21, 1928, MRN [id number]  PCP:  Lajean Manes, MD  Cardiologist: Dr. Marlou Porch / Dr. Angelena Form & Dr. Roxy Manns (TAVR)  CC: follow up after starting Eliquis for leaflet thrombosis   History of Present Illness:  [name] is a 88 y.o. male with a history of HTN, HLD, chronic diastolic CHF, RA on MTX, sinus bradycardia, depression and severe aortic stenosis s/p TAVR (03/27/18) who presents to clinic for follow up.     The patient was hospitalized to [facility] in 02/04/18 with symptoms of shortness of breath, abdominal bloating, chest pain and n/v.  He was hospitalized and evaluated comprehensively and found to have no significant acute problems with exception of the presence of severe aortic stenosis.  Peak velocity across aortic valve was measured 4.5 m/s corresponding to mean transvalvular gradient estimated 43 mmHg.  There was normal left ventricular systolic function.  The patient was referred to the multidisciplinary heart valve team and evaluated by Dr. Angelena Form. [facility] 02/06/18 showed normal mild nonobstructive coronary artery disease.   He ultimately underwent TAVR successful TAVR with a 23 mm Edwards Sapien 3 THV via the TF approach on 03/27/18. Post op echo showed EF 60%, normally functioning TAVR with mean gradient of 15 mm Hg and no mention of PVL on formal echo report. One month echo showed EF 60%, normally functioning TAVR with mean gradient of 14 mm HG (previously 15 mm hg) and new moderate PVL. Dr. Angelena Form and Dr. Roxy Manns felt that there was nothing to do in the absence of significant heart failure symptoms or symptomatic anemia.    He was admitted in 09/2018 for cholelithiasis/choledocholithiasis. He was treated with antibiotics and underwent laparoscopic cholecystectomy in 10/2018.   He was admitted in 09/2019 for syncope  felt to be related to vasovagal syncope. Echo at the time showed EF 60%, normally functioning TAVR with a mean gradient 9.6 mm hg and mild PVL   He was admitted 12/2021 for near syncope. TTE with marked increase in gradient through AV compared to prior echocardiogram.  He also had sinus bradycardia but with chronotropic response to exertion.  He was not on nodal blocking agents or Aricept.  Carotid US without significant finding. Orthostatic vitals negative but symptomatic from lying to sitting. TEE completed on 12/24/2021 and showed EF 60-65%, mild-mod PVL, mean gradient of 67mhg and acceleration time <1014m Of note, during admission, MRI head showed an acute/subacute infarct in the left parietal lobe. Neurology felt that this was probably an incidental finding. Neurology recommended to continue aspirin and outpatient follow-up with neurology. He had delirium and not oriented to year or date. Per hospitalist, prognosis was poor and outpatient palliative care recommended.  Cardiac CT 12/31/21 showed severe 3 leaflet HALT/HAM and recommended a prolonged trial of anticoagulation before any valve in valve TAVR attempted. The moderate PVL with thrombosis both would contribute to rising gradients and valve malfunction. Two areas of dense native valve calcium one in right and one in left sinus leading to non ideal apposition of stent struts to annulus. On TEE the moderate PVL seems to be coming more from the base of the right sinus.   I  saw him in the office on 01/12/22 for follow up. He was noted to have worsening dementia, heavy alcohol use, and frequent falls. After a very long risk and benefit discussion with the patient and daughter, they elected to proceed with [place]. He was started on Eliquis '5mg'$  BID. He was asked to not drive and completely abstain from alcohol.   Today he presents to clinic for follow up. Here with [name] and interpreter. Doing much better. Still feels foggy but overall doing much better. Hasn't  been very active but going to start walking again. Balance has still been off but no falls. No CP or SOB. No LE edema, orthopnea or PND. No dizziness or syncope. No blood in stool or urine. No palpitations.    Past Medical History:  Diagnosis Date   BPH (benign prostatic hypertrophy)    Depression    Diverticulosis of colon    GERD (gastroesophageal reflux disease)    Gilbert's syndrome    History of kidney stones    HLD (hyperlipidemia)    HOH (hard of hearing)    refuses to wear his Hearing Aid   Hypertension    Inguinal hernia    right   Internal hemorrhoid    Normal coronary arteries    a. Cath 07/2010: normal coronaries. Felt to have noncardiac CP/SOB at that time possibly r/t anxiety.   RA (rheumatoid arthritis) (HCC)    bilateral hands/ wrist--  seronegative   S/P TAVR (transcatheter aortic valve replacement) 03/27/2018   23 mm Edwards Sapien 3 transcatheter heart valve placed via percutaneous right transfemoral approach    Severe aortic stenosis    Thyroid nodule    noted 11-2009   Wears dentures     Past Surgical History:  Procedure Laterality Date   CARDIAC CATHETERIZATION  07-19-2010  dr Tressia Miners turner   normal coronary arteries,  ef 60%,  moderate aortic stenosis- gradient 9mHg, normal right heart pressure   CARDIOVASCULAR STRESS TEST  05/ 2011   dr sMarlou Porch  normal low risk perfusion study   CATARACT EXTRACTION W/ INTRAOCULAR LENS  IMPLANT, BILATERAL  2013   CATARACT EXTRACTION, BILATERAL     CHOLECYSTECTOMY N/A 10/11/2018   Procedure: LAPAROSCOPIC CHOLECYSTECTOMY WITH INTRAOPERATIVE CHOLANGIOGRAM;  Surgeon: TJovita Kussmaul MD;  [facility];  Service: General;  Laterality: N/A;   COLONOSCOPY  2010  approx   ERCP N/A 09/25/2018   Procedure: ENDOSCOPIC RETROGRADE CHOLANGIOPANCREATOGRAPHY (ERCP);  Surgeon: MClarene Essex MD;  [place] [facility]  Service: Endoscopy;  Laterality: N/A;   INGUINAL HERNIA REPAIR Right 08/23/2018   Procedure: OPEN RIGHT INGUINAL  HERNIA REPAIR WITH MESH;  Surgeon: GArmandina Gemma MD;  Location: [facility];  Service: General;  Laterality: Right;   INTRAOPERATIVE TRANSTHORACIC ECHOCARDIOGRAM N/A 03/27/2018   Procedure: INTRAOPERATIVE TRANSTHORACIC ECHOCARDIOGRAM;  Surgeon: MBurnell Blanks MD;  [facility]  Service: Open Heart Surgery;  Laterality: N/A;   REMOVAL OF STONES  09/25/2018   Procedure: REMOVAL OF STONES;  Surgeon: MClarene Essex MD;  [place]: [facility]  Service: Endoscopy;;   RIGHT/LEFT HEART CATH AND CORONARY ANGIOGRAPHY N/A 02/06/2018   Procedure: RIGHT/LEFT HEART CATH AND CORONARY ANGIOGRAPHY;  Surgeon: MBurnell Blanks MD;  [facility];  Service: Cardiovascular;  Laterality: N/A;   SPHINCTEROTOMY  09/25/2018   Procedure: SPHINCTEROTOMY;  Surgeon: MClarene Essex MD;  [place] [facility]  Service: Endoscopy;;   TEE WITHOUT CARDIOVERSION N/A 12/24/2021   Procedure: TRANSESOPHAGEAL ECHOCARDIOGRAM (TEE);  Surgeon: BJanina Mayo MD;  [facility];  Service: Cardiovascular;  Laterality: N/A;   THYROID LOBECTOMY Right 05/05/2015   Procedure: RIGHT THYROID LOBECTOMY;  Surgeon: Armandina Gemma, MD;  Location: [facility];  Service: General;  Laterality: Right;   TRANSANAL HEMORRHOIDAL DEARTERIALIZATION N/A 04/09/2014   Procedure: TRANSANAL HEMORRHOIDAL DEARTERIALIZATION OF INTERNAL HEMORROIDS;  Surgeon: Leighton Ruff, MD;  Location: [facility];  Service: General;  Laterality: N/A;   TRANSCATHETER AORTIC VALVE REPLACEMENT, TRANSFEMORAL  03/27/2018   TRANSCATHETER AORTIC VALVE REPLACEMENT, TRANSFEMORAL N/A 03/27/2018   Procedure: TRANSCATHETER AORTIC VALVE REPLACEMENT, TRANSFEMORAL. Edwards SAPIEN 3 Transcatheter Heart Valve 62m.;  Surgeon: MBurnell Blanks MD;  [facility]  Service: Open Heart Surgery;  Laterality: N/A;   TRANSTHORACIC ECHOCARDIOGRAM  11-26-2013   moderate focal basal and mild LVH/  ef 697-02%/ grade I diastolic dysfunction/ mild LAE/   moderate calcification with stenosis AV with mild regurg,  gradients 35 abd 58 mmHg /  mild TR    Current Medications: Outpatient Medications Prior to Visit  Medication Sig Dispense Refill   apixaban (ELIQUIS) 5 MG TABS tablet Take 1 tablet (5 mg total) by mouth 2 (two) times daily. 180 tablet 3   clobetasol cream (TEMOVATE) 06.37% Apply 1 application. topically 2 (two) times daily. (Patient taking differently: Apply 1 application  topically as needed.) 30 g 5   dutasteride (AVODART) 0.5 MG capsule Take 0.5 mg by mouth daily.     escitalopram (LEXAPRO) 20 MG tablet Take 1 tablet (20 mg total) by mouth daily. (Patient taking differently: Take 10 mg by mouth at bedtime. 10 mg) 30 tablet 4   losartan (COZAAR) 25 MG tablet Take 25 mg by mouth daily.   3   Melatonin 5 MG TABS Take 10-15 mg by mouth as needed (for sleep).     Multiple Vitamin (MULTIVITAMIN WITH MINERALS) TABS tablet Take 1 tablet by mouth daily.     ondansetron (ZOFRAN) 4 MG tablet Take 1 tablet (4 mg total) by mouth every 8 (eight) hours as needed for nausea or vomiting. 20 tablet 0   pantoprazole (PROTONIX) 40 MG tablet Take 1 tablet (40 mg total) by mouth daily. 30 tablet 0   polyethylene glycol (MIRALAX) 17 g packet Take 17 g by mouth daily as needed for moderate constipation. (Patient taking differently: Take 17 g by mouth as needed for moderate constipation.) 14 each 0   senna-docusate (SENOKOT-S) 8.6-50 MG tablet Take 1 tablet by mouth 2 (two) times daily. (Patient taking differently: Take 1 tablet by mouth as needed.)     SKYRIZI PEN 150 MG/ML SOAJ INJECT '150MG'$  SUBCUTANEOUSLY EVERY 12 WEEKS AS DIRECTED. 1 mL 10   No facility-administered medications prior to visit.     Allergies:   Ativan [lorazepam] and Phenergan [promethazine hcl]   Social History   Socioeconomic History   Marital status: Widowed    Spouse name: Not on file   Number of children: Not on file   Years of education: Not on file   Highest education  level: Not on file  Occupational History   Not on file  Tobacco Use   Smoking status: Former    Years: 40.00    Types: Cigarettes    Start date: 06/13/1973    Quit date: 04/03/1981    Years since quitting: 41.0   Smokeless tobacco: Never  Vaping Use   Vaping Use: Never used  Substance and Sexual Activity   Alcohol use: Yes    Comment: occasional   Drug use: No  Sexual activity: Not on file  Other Topics Concern   Not on file  Social History Narrative   Not on file   Social Determinants of Health   Financial Resource Strain: Not on file  Food Insecurity: Not on file  Transportation Needs: Not on file  Physical Activity: Not on file  Stress: Not on file  Social Connections: Not on file     Family History:  The patient's family history includes Breast cancer in his sister; CVA in his father; Diabetes in his brother and brother; Pulmonary embolism (age of onset: 19) in his mother.      ROS:   Please see the history of present illness.    ROS All other systems reviewed and are negative.   PHYSICAL EXAM:   VS:  BP (!) 145/60   Pulse 68   Ht '5\' 6"'$  (1.676 m)   Wt 170 lb 2 oz (77.2 kg)   SpO2 91%   BMI 27.46 kg/m    GEN: Well nourished, well developed, in no acute distress HEENT: normal Neck: no JVD or masses Cardiac: RRR; 3/6 systolic murmurs. No rubs, or gallops,no edema  Respiratory:  clear to auscultation bilaterally, normal work of breathing GI: soft, nontender, nondistended, + BS MS: no deformity or atrophy Skin: warm and dry, no rash Neuro:  Alert and Oriented x 3, Strength and sensation are intact Psych: euthymic mood, full affect   Wt Readings from Last 3 Encounters:  04/29/22 170 lb 2 oz (77.2 kg)  02/16/22 169 lb (76.7 kg)  02/03/22 167 lb (75.8 kg)    Today's Vitals   04/29/22 1218 04/29/22 1855  BP: (!) 170/68 (!) 145/60  Pulse: 68   SpO2: 91%   Weight: 170 lb 2 oz (77.2 kg)   Height: '5\' 6"'$  (1.676 m)    Body mass index is 27.46  kg/m.    Studies/Labs Reviewed:   EKG:  EKG is NOT ordered today.    Recent Labs: 12/21/2021: TSH 5.610 12/25/2021: ALT 31; BUN 24; Creatinine, Ser 1.07; Hemoglobin 13.7; Magnesium 2.1; Platelets 145; Potassium 4.4; Sodium 138   Lipid Panel    Component Value Date/Time   CHOL 161 12/25/2021 0539   TRIG 61 12/25/2021 0539   HDL 57 12/25/2021 0539   CHOLHDL 2.8 12/25/2021 0539   VLDL 12 12/25/2021 0539   LDLCALC 92 12/25/2021 0539    Additional studies/ records that were reviewed today include:  TAVR OPERATIVE NOTE     Date of Procedure:                03/27/2018   Preoperative Diagnosis:      Severe Aortic Stenosis   Procedure:        Transcatheter Aortic Valve Replacement - Percutaneous Right Transfemoral Approach             Edwards Sapien 3 THV (size 23 mm, model # 9600TFX, serial # [id number])   Co-Surgeons:                        Valentina Gu. Roxy Manns, MD and  Lauree Chandler, MD     Pre-operative Echo Findings: Severe aortic stenosis Normal left ventricular systolic function   Post-operative Echo Findings: Mild paravalvular leak Normal left ventricular systolic function   ______________  Echo 04/25/18 (1 month s/p TAVR)  Study Conclusions   - Left ventricle: The cavity size was normal. There was mild   concentric hypertrophy. Systolic function was normal. The  estimated ejection fraction was in the range of 60% to 65%. Wall   motion was normal; there were no regional wall motion   abnormalities. Doppler parameters are consistent with abnormal   left ventricular relaxation (grade 1 diastolic dysfunction). - Aortic valve: S/P TAVR. A prosthesis was present and functioning   normally. The prosthesis had a normal range of motion. The sewing   ring appeared normal, had no rocking motion, and showed no   evidence of dehiscence. There was moderate perivalvular   regurgitation. Mean gradient (S): 14 mm Hg. Regurgitation   pressure half-time: 386 ms. - Left  atrium: The atrium w as mildly dilated. - Atrial septum: No defect or patent foramen ovale was identified. - Pulmonary arteries: PA peak pressure: 32 mm Hg (S). Impressions: - S/P TAVR. On this study, there is moderate aortic regurgitation   that appears most likel perivalvular in nature. It was not seen   on prior study. Mean gradient is similar to prior.   __________________   Echo 04/25/19 ( 1year s/p TAVR) IMPRESSIONS   1. Left ventricular ejection fraction, by visual estimation, is 60 to 65%. The left ventricle has normal function. There is no left ventricular hypertrophy.  2. Left ventricular diastolic parameters are consistent with Grade I diastolic dysfunction (impaired relaxation).  3. Elevated left ventricular end-diastolic pressure.  4. Global right ventricle has normal systolic function.The right ventricular size is normal. No increase in right ventricular wall thickness.  5. Left atrial size was normal.  6. Right atrial size was normal.  7. Moderate mitral annular calcification. No evidence of mitral valve regurgitation. No evidence of mitral stenosis.  8. The tricuspid valve is normal in structure. Tricuspid valve regurgitation is trivial.  9. 18m Edwards Sapien bioprosthetic, stented aortic valve (TAVR) valve is present in the aortic position. Mild perivalvular Aortic valve regurgitation is visualized. Aortic valve mean gradient measures 18 mmHg. Aortic valve peak gradient measures 29.4  mmHg. Dimensionless index 0.61. 10. The pulmonic valve was normal in structure. Pulmonic valve regurgitation is trivial. 11. Mildly elevated pulmonary artery systolic pressure. 12. The inferior vena cava is normal in size with greater than 50% respiratory variability, suggesting right atrial pressure of 3 mmHg.  _____________________  Echo 01/12/22 IMPRESSIONS  1. Left ventricular ejection fraction, by estimation, is 70 to 75%. The  left ventricle has hyperdynamic function. The left  ventricle has no  regional wall motion abnormalities. There is mild left ventricular  hypertrophy. Left ventricular diastolic  parameters are indeterminate.   2. Right ventricular systolic function is normal. The right ventricular  size is normal. There is normal pulmonary artery systolic pressure. The  estimated right ventricular systolic pressure is 294.0mmHg.   3. The mitral valve is normal in structure. No evidence of mitral valve  regurgitation. No evidence of mitral stenosis.   4. The inferior vena cava is normal in size with greater than 50%  respiratory variability, suggesting right atrial pressure of 3 mmHg.   5. There is a 23 mm Edwards Sapien prosthetic (TAVR) valve present in the  aortic position. Mild perivalvular leak. Vmax 4.5 m/s, MG 45 mmHg, EOA 1.5  cm^2, DI 0.38, AT 974m Marked increase in gradients through AV compared  to prior echo (1827m on  05/18/21). AT is low, and has increased LVOT gradient, suggesting high flow  state contributing to elevated AV gradients, though not enough to explain  his MG 44m52m Prosthetic valve is not well visualized, would recommend  TEE to better assess  etiology of  increased gradients   __________________________  TEE 12/24/21 IMPRESSIONS   1. Left ventricular ejection fraction, by estimation, is 60 to 65%. The  left ventricle has normal function.   2. Right ventricular systolic function is normal. The right ventricular  size is normal.   3. No left atrial/left atrial appendage thrombus was detected.   4. The mitral valve is normal in structure. No evidence of mitral valve  regurgitation.   5. Mild-moderate paravalvular leak 0.19 cm. Well seated, no dehiscence.  No thrombus. V max 4.8 m/s. mean gradient 60 mmhg. DI 0.25, AT < 100 ms,  EOAi 0.5 cm/m2, concerning for prosthesis-patient mismatch. Aortic valve  regurgitation is not visualized.  There is a 23 mm Sapien prosthetic (TAVR) valve present in the aortic  position.  Procedure Date: 03/27/2018.   Conclusion(s)/Recommendation(s): Discussed with primary cardiologist.   ___________________________  CT 12/31/21 IMPRESSION: 1. 23 mm Sapien 3 valve implant 03/27/18. Patient presented climactically with CHF and elevated mean gradient 40 mmHg and moderate PVL. Study   Suggests that patient has severe 3 leaflet HALT/HAM and should have a prolonged trial of anticoagulation before any valve in valve TAVR attempted. The   Moderate PVL with thrombosis both would contribute to rising gradients and valve malfunction   2. Two areas of dense native valve calcium one in right and one in left sinus leading to non ideal apposition of stent struts to annulus On TEE the moderate   PVL seems to be coming more from the base of the right sinus   3. Both coronary artery ostia originate near the superior margin of the stent with an acceptable VCD for the LM but a small/short VCD for the RCA The coronary heights prior to implant LM shallow 9.1 mm and RCA 11.6 mm   Would not recommend initial strategy of valve in valve TAVR.  _________________________  Echo 04/29/22 IMPRESSIONS  1. Left ventricular ejection fraction, by estimation, is 70 to 75%. The left ventricle has hyperdynamic function. The left ventricle has no regional wall motion abnormalities. Left ventricular diastolic parameters are consistent with Grade I diastolic dysfunction (impaired relaxation).  2. Right ventricular systolic function is normal. The right ventricular size is normal.  3. The mitral valve is normal in structure. No evidence of mitral valve regurgitation.  4. There is a 23 mm Edwards Sapien prosthetic (TAVR) valve present in the aortic position. Mild PVL. Marked improvement in prosthetic valve stenosis; MG 59mHg, improved from 412mg on prior echo 12/20/21     ASSESSMENT & PLAN:   Bioprosthetic valve dysfunction: started on Eliquis with marked improvement in prosthetic valve  stenosis: MG 4537m --> 26m11m He is also clinically doing better. I am hesitant to stop OAC Traeren dramatic improvement. Discussed plan to continue for now. Daughter a little worried about his long term bleeding risk and will alert us iKoreahis fall risk/gait imbalance worsens. In that case we would stop OAC [place] watch valve gradients. He looks very good in person today in the office. He will continue regular follow up with Dr. SkaiMarlou Porch see us o[place]oreaas need basis.   Chronic diastolic CHF:  appears euvolemic off diuretics.   HTN: BP elevated but my personal repeat 145/60 which is still mildly elevated. However, given advanced age, falls and chronic dizziness, will not make any changes.    HLD: continue statin   Medication Adjustments/Labs and Tests Ordered: Current medicines are reviewed at length with the patient today.  Concerns regarding medicines are  outlined above.  Medication changes, Labs and Tests ordered today are listed in the Patient Instructions below. Patient Instructions  Medication Instructions:  Your physician recommends that you continue on your current medications as directed. Please refer to the Current Medication list given to you today.  *If you need a refill on your cardiac medications before your next appointment, please call your pharmacy*   Lab Work: none If you have labs (blood work) drawn today and your tests are completely normal, you will receive your results only by: [place] (if you have MyChart) OR A paper copy in the mail If you have any lab test that is abnormal or we need to change your treatment, we will call you to review the results.   Testing/Procedures: none   Follow-Up: At [facility], you and your health needs are our priority.  As part of our continuing mission to provide you with exceptional heart care, we have created designated Provider Care Teams.  These Care Teams include your primary Cardiologist (physician) and Advanced  Practice Providers (APPs -  Physician Assistants and Nurse Practitioners) who all work together to provide you with the care you need, when you need it.  We recommend signing up for the patient portal called "MyChart".  Sign up information is provided on this After Visit Summary.  MyChart is used to connect with patients for Virtual Visits (Telemedicine).  Patients are able to view lab/test results, encounter notes, upcoming appointments, etc.  Non-urgent messages can be sent to your provider as well.   To learn more about what you can do with MyChart, go to [email or web address].    Your next appointment:  August 2024   The format for your next appointment:   In Person  Provider:   Candee Furbish, MD     Other Instructions    Important Information About Sugar          Signed, Angelena Form, PA-C  04/29/2022 7:00 PM    [address], [place], [place]  [address] Phone: [phone]; Fax: [phone]

## 2022-04-29 ENCOUNTER — Ambulatory Visit: Payer: Medicare Other | Attending: Cardiovascular Disease | Admitting: Physician Assistant

## 2022-04-29 ENCOUNTER — Ambulatory Visit (HOSPITAL_COMMUNITY): Payer: Medicare Other | Attending: Cardiology

## 2022-04-29 VITALS — BP 145/60 | HR 68 | Ht 66.0 in | Wt 170.1 lb

## 2022-04-29 DIAGNOSIS — Z952 Presence of prosthetic heart valve: Secondary | ICD-10-CM

## 2022-04-29 DIAGNOSIS — I5032 Chronic diastolic (congestive) heart failure: Secondary | ICD-10-CM | POA: Diagnosis not present

## 2022-04-29 DIAGNOSIS — E7800 Pure hypercholesterolemia, unspecified: Secondary | ICD-10-CM

## 2022-04-29 DIAGNOSIS — I10 Essential (primary) hypertension: Secondary | ICD-10-CM

## 2022-04-29 DIAGNOSIS — T8209XD Other mechanical complication of heart valve prosthesis, subsequent encounter: Secondary | ICD-10-CM

## 2022-04-29 DIAGNOSIS — T8209XA Other mechanical complication of heart valve prosthesis, initial encounter: Secondary | ICD-10-CM

## 2022-04-29 LAB — ECHOCARDIOGRAM COMPLETE
AR max vel: 1.99 cm2
AV Area VTI: 2.49 cm2
AV Area mean vel: 2.07 cm2
AV Mean grad: 13.7 mmHg
AV Peak grad: 28.2 mmHg
Ao pk vel: 2.66 m/s
Area-P 1/2: 2.34 cm2
S' Lateral: 1.9 cm

## 2022-04-29 NOTE — Patient Instructions (Signed)
Medication Instructions:  Your physician recommends that you continue on your current medications as directed. Please refer to the Current Medication list given to you today.  *If you need a refill on your cardiac medications before your next appointment, please call your pharmacy*   Lab Work: none If you have labs (blood work) drawn today and your tests are completely normal, you will receive your results only by: [place] (if you have MyChart) OR A paper copy in the mail If you have any lab test that is abnormal or we need to change your treatment, we will call you to review the results.   Testing/Procedures: none   Follow-Up: At [facility], you and your health needs are our priority.  As part of our continuing mission to provide you with exceptional heart care, we have created designated Provider Care Teams.  These Care Teams include your primary Cardiologist (physician) and Advanced Practice Providers (APPs -  Physician Assistants and Nurse Practitioners) who all work together to provide you with the care you need, when you need it.  We recommend signing up for the patient portal called "MyChart".  Sign up information is provided on this After Visit Summary.  MyChart is used to connect with patients for Virtual Visits (Telemedicine).  Patients are able to view lab/test results, encounter notes, upcoming appointments, etc.  Non-urgent messages can be sent to your provider as well.   To learn more about what you can do with MyChart, go to [email or web address].    Your next appointment:  August 2024   The format for your next appointment:   In Person  Provider:   Candee Furbish, MD     Other Instructions    Important Information About Sugar

## 2022-05-04 ENCOUNTER — Ambulatory Visit: Payer: Medicare Other

## 2022-05-04 ENCOUNTER — Other Ambulatory Visit (HOSPITAL_COMMUNITY): Payer: Medicare Other

## 2022-06-03 DIAGNOSIS — E7800 Pure hypercholesterolemia, unspecified: Secondary | ICD-10-CM | POA: Diagnosis not present

## 2022-06-03 DIAGNOSIS — I10 Essential (primary) hypertension: Secondary | ICD-10-CM | POA: Diagnosis not present

## 2022-06-03 DIAGNOSIS — K219 Gastro-esophageal reflux disease without esophagitis: Secondary | ICD-10-CM | POA: Diagnosis not present

## 2022-07-08 DIAGNOSIS — K7460 Unspecified cirrhosis of liver: Secondary | ICD-10-CM | POA: Diagnosis not present

## 2022-07-08 DIAGNOSIS — Z0000 Encounter for general adult medical examination without abnormal findings: Secondary | ICD-10-CM | POA: Diagnosis not present

## 2022-07-08 DIAGNOSIS — E041 Nontoxic single thyroid nodule: Secondary | ICD-10-CM | POA: Diagnosis not present

## 2022-07-08 DIAGNOSIS — I5032 Chronic diastolic (congestive) heart failure: Secondary | ICD-10-CM | POA: Diagnosis not present

## 2022-07-08 DIAGNOSIS — I700 Atherosclerosis of aorta: Secondary | ICD-10-CM | POA: Diagnosis not present

## 2022-07-08 DIAGNOSIS — I350 Nonrheumatic aortic (valve) stenosis: Secondary | ICD-10-CM | POA: Diagnosis not present

## 2022-07-08 DIAGNOSIS — R7301 Impaired fasting glucose: Secondary | ICD-10-CM | POA: Diagnosis not present

## 2022-07-08 DIAGNOSIS — I10 Essential (primary) hypertension: Secondary | ICD-10-CM | POA: Diagnosis not present

## 2022-07-08 DIAGNOSIS — R7303 Prediabetes: Secondary | ICD-10-CM | POA: Diagnosis not present

## 2022-07-08 DIAGNOSIS — Z79899 Other long term (current) drug therapy: Secondary | ICD-10-CM | POA: Diagnosis not present

## 2022-07-08 DIAGNOSIS — I351 Nonrheumatic aortic (valve) insufficiency: Secondary | ICD-10-CM | POA: Diagnosis not present

## 2022-07-21 ENCOUNTER — Ambulatory Visit (INDEPENDENT_AMBULATORY_CARE_PROVIDER_SITE_OTHER): Payer: 59 | Admitting: Podiatry

## 2022-07-21 ENCOUNTER — Encounter: Payer: Self-pay | Admitting: Podiatry

## 2022-07-21 VITALS — BP 161/53 | HR 64

## 2022-07-21 DIAGNOSIS — M79675 Pain in left toe(s): Secondary | ICD-10-CM | POA: Diagnosis not present

## 2022-07-21 DIAGNOSIS — M79674 Pain in right toe(s): Secondary | ICD-10-CM

## 2022-07-21 DIAGNOSIS — B351 Tinea unguium: Secondary | ICD-10-CM | POA: Diagnosis not present

## 2022-07-21 DIAGNOSIS — L309 Dermatitis, unspecified: Secondary | ICD-10-CM

## 2022-07-21 NOTE — Progress Notes (Addendum)
This patient presents  to my office for at risk foot care.  This patient requires this care by a professional since this patient will be at risk due to having diabetes.  Patient is under treatment for dermatitis by his dermatologist.  This patient is unable to cut nails himself since the patient cannot reach his nails.These nails are painful walking and wearing shoes.  This patient presents for at risk foot care today. He presents with an interpreter.  General Appearance  Alert, conversant and in no acute stress.  Vascular  Dorsalis pedis and posterior tibial  pulses are weakly palpable  bilaterally.  Capillary return is within normal limits  bilaterally. Cold feet. bilaterally.  Neurologic  Senn-Weinstein monofilament wire test within normal limits  bilaterally. Muscle power within normal limits bilaterally.  Nails Thick disfigured discolored nails with subungual debris  from hallux to fifth toes bilaterally. No evidence of bacterial infection or drainage bilaterally.  Orthopedic  No limitations of motion  feet .  No crepitus or effusions noted.  No bony pathology or digital deformities noted.  Skin  normotropic skin with no porokeratosis noted bilaterally.  No signs of infections or ulcers noted.   Dermatitis with redness and scaliness dorsum of left foot.  Onychomycosis  Pain in right toes  Pain in left toes  Dermatitis.  Consent was obtained for treatment procedures.   Mechanical debridement of nails 1-5  bilaterally performed with a nail nipper.  Filed with dremel without incident. Discussed formula 7 medicine for nails.   Return office visit      3 months                Told patient to return for periodic foot care and evaluation due to potential at risk complications.   Gardiner Barefoot DPM

## 2022-07-28 DIAGNOSIS — I5032 Chronic diastolic (congestive) heart failure: Secondary | ICD-10-CM | POA: Diagnosis not present

## 2022-07-28 DIAGNOSIS — K219 Gastro-esophageal reflux disease without esophagitis: Secondary | ICD-10-CM | POA: Diagnosis not present

## 2022-07-28 DIAGNOSIS — L4050 Arthropathic psoriasis, unspecified: Secondary | ICD-10-CM | POA: Diagnosis not present

## 2022-07-28 DIAGNOSIS — R2681 Unsteadiness on feet: Secondary | ICD-10-CM | POA: Diagnosis not present

## 2022-07-28 DIAGNOSIS — E7800 Pure hypercholesterolemia, unspecified: Secondary | ICD-10-CM | POA: Diagnosis not present

## 2022-07-28 DIAGNOSIS — I110 Hypertensive heart disease with heart failure: Secondary | ICD-10-CM | POA: Diagnosis not present

## 2022-07-28 DIAGNOSIS — I700 Atherosclerosis of aorta: Secondary | ICD-10-CM | POA: Diagnosis not present

## 2022-07-28 DIAGNOSIS — I351 Nonrheumatic aortic (valve) insufficiency: Secondary | ICD-10-CM | POA: Diagnosis not present

## 2022-07-28 DIAGNOSIS — I10 Essential (primary) hypertension: Secondary | ICD-10-CM | POA: Diagnosis not present

## 2022-07-28 DIAGNOSIS — I350 Nonrheumatic aortic (valve) stenosis: Secondary | ICD-10-CM | POA: Diagnosis not present

## 2022-07-28 DIAGNOSIS — G309 Alzheimer's disease, unspecified: Secondary | ICD-10-CM | POA: Diagnosis not present

## 2022-08-02 DIAGNOSIS — G309 Alzheimer's disease, unspecified: Secondary | ICD-10-CM | POA: Diagnosis not present

## 2022-08-02 DIAGNOSIS — I110 Hypertensive heart disease with heart failure: Secondary | ICD-10-CM | POA: Diagnosis not present

## 2022-08-02 DIAGNOSIS — I5032 Chronic diastolic (congestive) heart failure: Secondary | ICD-10-CM | POA: Diagnosis not present

## 2022-08-02 DIAGNOSIS — I351 Nonrheumatic aortic (valve) insufficiency: Secondary | ICD-10-CM | POA: Diagnosis not present

## 2022-08-02 DIAGNOSIS — I700 Atherosclerosis of aorta: Secondary | ICD-10-CM | POA: Diagnosis not present

## 2022-08-02 DIAGNOSIS — I350 Nonrheumatic aortic (valve) stenosis: Secondary | ICD-10-CM | POA: Diagnosis not present

## 2022-08-02 DIAGNOSIS — E7800 Pure hypercholesterolemia, unspecified: Secondary | ICD-10-CM | POA: Diagnosis not present

## 2022-08-02 DIAGNOSIS — R2681 Unsteadiness on feet: Secondary | ICD-10-CM | POA: Diagnosis not present

## 2022-08-02 DIAGNOSIS — L4050 Arthropathic psoriasis, unspecified: Secondary | ICD-10-CM | POA: Diagnosis not present

## 2022-08-04 DIAGNOSIS — R2681 Unsteadiness on feet: Secondary | ICD-10-CM | POA: Diagnosis not present

## 2022-08-04 DIAGNOSIS — I110 Hypertensive heart disease with heart failure: Secondary | ICD-10-CM | POA: Diagnosis not present

## 2022-08-04 DIAGNOSIS — L4050 Arthropathic psoriasis, unspecified: Secondary | ICD-10-CM | POA: Diagnosis not present

## 2022-08-04 DIAGNOSIS — I700 Atherosclerosis of aorta: Secondary | ICD-10-CM | POA: Diagnosis not present

## 2022-08-04 DIAGNOSIS — I5032 Chronic diastolic (congestive) heart failure: Secondary | ICD-10-CM | POA: Diagnosis not present

## 2022-08-04 DIAGNOSIS — I350 Nonrheumatic aortic (valve) stenosis: Secondary | ICD-10-CM | POA: Diagnosis not present

## 2022-08-04 DIAGNOSIS — E7800 Pure hypercholesterolemia, unspecified: Secondary | ICD-10-CM | POA: Diagnosis not present

## 2022-08-04 DIAGNOSIS — G309 Alzheimer's disease, unspecified: Secondary | ICD-10-CM | POA: Diagnosis not present

## 2022-08-04 DIAGNOSIS — I351 Nonrheumatic aortic (valve) insufficiency: Secondary | ICD-10-CM | POA: Diagnosis not present

## 2022-08-08 DIAGNOSIS — G309 Alzheimer's disease, unspecified: Secondary | ICD-10-CM | POA: Diagnosis not present

## 2022-08-08 DIAGNOSIS — I351 Nonrheumatic aortic (valve) insufficiency: Secondary | ICD-10-CM | POA: Diagnosis not present

## 2022-08-08 DIAGNOSIS — E7800 Pure hypercholesterolemia, unspecified: Secondary | ICD-10-CM | POA: Diagnosis not present

## 2022-08-08 DIAGNOSIS — L4050 Arthropathic psoriasis, unspecified: Secondary | ICD-10-CM | POA: Diagnosis not present

## 2022-08-08 DIAGNOSIS — R2681 Unsteadiness on feet: Secondary | ICD-10-CM | POA: Diagnosis not present

## 2022-08-08 DIAGNOSIS — I5032 Chronic diastolic (congestive) heart failure: Secondary | ICD-10-CM | POA: Diagnosis not present

## 2022-08-08 DIAGNOSIS — I700 Atherosclerosis of aorta: Secondary | ICD-10-CM | POA: Diagnosis not present

## 2022-08-08 DIAGNOSIS — I110 Hypertensive heart disease with heart failure: Secondary | ICD-10-CM | POA: Diagnosis not present

## 2022-08-08 DIAGNOSIS — I350 Nonrheumatic aortic (valve) stenosis: Secondary | ICD-10-CM | POA: Diagnosis not present

## 2022-08-11 DIAGNOSIS — G309 Alzheimer's disease, unspecified: Secondary | ICD-10-CM | POA: Diagnosis not present

## 2022-08-11 DIAGNOSIS — I350 Nonrheumatic aortic (valve) stenosis: Secondary | ICD-10-CM | POA: Diagnosis not present

## 2022-08-11 DIAGNOSIS — I110 Hypertensive heart disease with heart failure: Secondary | ICD-10-CM | POA: Diagnosis not present

## 2022-08-11 DIAGNOSIS — I700 Atherosclerosis of aorta: Secondary | ICD-10-CM | POA: Diagnosis not present

## 2022-08-11 DIAGNOSIS — I351 Nonrheumatic aortic (valve) insufficiency: Secondary | ICD-10-CM | POA: Diagnosis not present

## 2022-08-11 DIAGNOSIS — L4050 Arthropathic psoriasis, unspecified: Secondary | ICD-10-CM | POA: Diagnosis not present

## 2022-08-11 DIAGNOSIS — I5032 Chronic diastolic (congestive) heart failure: Secondary | ICD-10-CM | POA: Diagnosis not present

## 2022-08-11 DIAGNOSIS — E7800 Pure hypercholesterolemia, unspecified: Secondary | ICD-10-CM | POA: Diagnosis not present

## 2022-08-11 DIAGNOSIS — R2681 Unsteadiness on feet: Secondary | ICD-10-CM | POA: Diagnosis not present

## 2022-08-17 DIAGNOSIS — H43813 Vitreous degeneration, bilateral: Secondary | ICD-10-CM | POA: Diagnosis not present

## 2022-08-17 DIAGNOSIS — H524 Presbyopia: Secondary | ICD-10-CM | POA: Diagnosis not present

## 2022-08-17 DIAGNOSIS — G309 Alzheimer's disease, unspecified: Secondary | ICD-10-CM | POA: Diagnosis not present

## 2022-08-17 DIAGNOSIS — I5032 Chronic diastolic (congestive) heart failure: Secondary | ICD-10-CM | POA: Diagnosis not present

## 2022-08-17 DIAGNOSIS — H5213 Myopia, bilateral: Secondary | ICD-10-CM | POA: Diagnosis not present

## 2022-08-17 DIAGNOSIS — R2681 Unsteadiness on feet: Secondary | ICD-10-CM | POA: Diagnosis not present

## 2022-08-17 DIAGNOSIS — H26491 Other secondary cataract, right eye: Secondary | ICD-10-CM | POA: Diagnosis not present

## 2022-08-17 DIAGNOSIS — I700 Atherosclerosis of aorta: Secondary | ICD-10-CM | POA: Diagnosis not present

## 2022-08-17 DIAGNOSIS — L4050 Arthropathic psoriasis, unspecified: Secondary | ICD-10-CM | POA: Diagnosis not present

## 2022-08-17 DIAGNOSIS — E7800 Pure hypercholesterolemia, unspecified: Secondary | ICD-10-CM | POA: Diagnosis not present

## 2022-08-17 DIAGNOSIS — H35371 Puckering of macula, right eye: Secondary | ICD-10-CM | POA: Diagnosis not present

## 2022-08-17 DIAGNOSIS — H04123 Dry eye syndrome of bilateral lacrimal glands: Secondary | ICD-10-CM | POA: Diagnosis not present

## 2022-08-17 DIAGNOSIS — I350 Nonrheumatic aortic (valve) stenosis: Secondary | ICD-10-CM | POA: Diagnosis not present

## 2022-08-17 DIAGNOSIS — I351 Nonrheumatic aortic (valve) insufficiency: Secondary | ICD-10-CM | POA: Diagnosis not present

## 2022-08-17 DIAGNOSIS — I110 Hypertensive heart disease with heart failure: Secondary | ICD-10-CM | POA: Diagnosis not present

## 2022-08-19 DIAGNOSIS — I5032 Chronic diastolic (congestive) heart failure: Secondary | ICD-10-CM | POA: Diagnosis not present

## 2022-08-19 DIAGNOSIS — I110 Hypertensive heart disease with heart failure: Secondary | ICD-10-CM | POA: Diagnosis not present

## 2022-08-19 DIAGNOSIS — E7800 Pure hypercholesterolemia, unspecified: Secondary | ICD-10-CM | POA: Diagnosis not present

## 2022-08-19 DIAGNOSIS — G309 Alzheimer's disease, unspecified: Secondary | ICD-10-CM | POA: Diagnosis not present

## 2022-08-19 DIAGNOSIS — R2681 Unsteadiness on feet: Secondary | ICD-10-CM | POA: Diagnosis not present

## 2022-08-19 DIAGNOSIS — L4050 Arthropathic psoriasis, unspecified: Secondary | ICD-10-CM | POA: Diagnosis not present

## 2022-08-19 DIAGNOSIS — I700 Atherosclerosis of aorta: Secondary | ICD-10-CM | POA: Diagnosis not present

## 2022-08-19 DIAGNOSIS — I351 Nonrheumatic aortic (valve) insufficiency: Secondary | ICD-10-CM | POA: Diagnosis not present

## 2022-08-19 DIAGNOSIS — I350 Nonrheumatic aortic (valve) stenosis: Secondary | ICD-10-CM | POA: Diagnosis not present

## 2022-08-25 DIAGNOSIS — I110 Hypertensive heart disease with heart failure: Secondary | ICD-10-CM | POA: Diagnosis not present

## 2022-08-25 DIAGNOSIS — I350 Nonrheumatic aortic (valve) stenosis: Secondary | ICD-10-CM | POA: Diagnosis not present

## 2022-08-25 DIAGNOSIS — E7800 Pure hypercholesterolemia, unspecified: Secondary | ICD-10-CM | POA: Diagnosis not present

## 2022-08-25 DIAGNOSIS — I700 Atherosclerosis of aorta: Secondary | ICD-10-CM | POA: Diagnosis not present

## 2022-08-25 DIAGNOSIS — G309 Alzheimer's disease, unspecified: Secondary | ICD-10-CM | POA: Diagnosis not present

## 2022-08-25 DIAGNOSIS — B351 Tinea unguium: Secondary | ICD-10-CM | POA: Diagnosis not present

## 2022-08-25 DIAGNOSIS — I5032 Chronic diastolic (congestive) heart failure: Secondary | ICD-10-CM | POA: Diagnosis not present

## 2022-08-25 DIAGNOSIS — R2681 Unsteadiness on feet: Secondary | ICD-10-CM | POA: Diagnosis not present

## 2022-08-25 DIAGNOSIS — L400 Psoriasis vulgaris: Secondary | ICD-10-CM | POA: Diagnosis not present

## 2022-08-25 DIAGNOSIS — L4050 Arthropathic psoriasis, unspecified: Secondary | ICD-10-CM | POA: Diagnosis not present

## 2022-08-25 DIAGNOSIS — I351 Nonrheumatic aortic (valve) insufficiency: Secondary | ICD-10-CM | POA: Diagnosis not present

## 2022-09-01 DIAGNOSIS — I351 Nonrheumatic aortic (valve) insufficiency: Secondary | ICD-10-CM | POA: Diagnosis not present

## 2022-09-01 DIAGNOSIS — L4050 Arthropathic psoriasis, unspecified: Secondary | ICD-10-CM | POA: Diagnosis not present

## 2022-09-01 DIAGNOSIS — I700 Atherosclerosis of aorta: Secondary | ICD-10-CM | POA: Diagnosis not present

## 2022-09-01 DIAGNOSIS — G309 Alzheimer's disease, unspecified: Secondary | ICD-10-CM | POA: Diagnosis not present

## 2022-09-01 DIAGNOSIS — I5032 Chronic diastolic (congestive) heart failure: Secondary | ICD-10-CM | POA: Diagnosis not present

## 2022-09-01 DIAGNOSIS — R2681 Unsteadiness on feet: Secondary | ICD-10-CM | POA: Diagnosis not present

## 2022-09-01 DIAGNOSIS — I110 Hypertensive heart disease with heart failure: Secondary | ICD-10-CM | POA: Diagnosis not present

## 2022-09-01 DIAGNOSIS — E7800 Pure hypercholesterolemia, unspecified: Secondary | ICD-10-CM | POA: Diagnosis not present

## 2022-09-01 DIAGNOSIS — I350 Nonrheumatic aortic (valve) stenosis: Secondary | ICD-10-CM | POA: Diagnosis not present

## 2022-09-13 DIAGNOSIS — I350 Nonrheumatic aortic (valve) stenosis: Secondary | ICD-10-CM | POA: Diagnosis not present

## 2022-09-13 DIAGNOSIS — E7800 Pure hypercholesterolemia, unspecified: Secondary | ICD-10-CM | POA: Diagnosis not present

## 2022-09-13 DIAGNOSIS — L4050 Arthropathic psoriasis, unspecified: Secondary | ICD-10-CM | POA: Diagnosis not present

## 2022-09-13 DIAGNOSIS — I351 Nonrheumatic aortic (valve) insufficiency: Secondary | ICD-10-CM | POA: Diagnosis not present

## 2022-09-13 DIAGNOSIS — R2681 Unsteadiness on feet: Secondary | ICD-10-CM | POA: Diagnosis not present

## 2022-09-13 DIAGNOSIS — I5032 Chronic diastolic (congestive) heart failure: Secondary | ICD-10-CM | POA: Diagnosis not present

## 2022-09-13 DIAGNOSIS — G309 Alzheimer's disease, unspecified: Secondary | ICD-10-CM | POA: Diagnosis not present

## 2022-09-13 DIAGNOSIS — I110 Hypertensive heart disease with heart failure: Secondary | ICD-10-CM | POA: Diagnosis not present

## 2022-09-13 DIAGNOSIS — I700 Atherosclerosis of aorta: Secondary | ICD-10-CM | POA: Diagnosis not present

## 2022-09-26 DIAGNOSIS — G309 Alzheimer's disease, unspecified: Secondary | ICD-10-CM | POA: Diagnosis not present

## 2022-09-26 DIAGNOSIS — K219 Gastro-esophageal reflux disease without esophagitis: Secondary | ICD-10-CM | POA: Diagnosis not present

## 2022-09-26 DIAGNOSIS — I10 Essential (primary) hypertension: Secondary | ICD-10-CM | POA: Diagnosis not present

## 2022-09-26 DIAGNOSIS — E7800 Pure hypercholesterolemia, unspecified: Secondary | ICD-10-CM | POA: Diagnosis not present

## 2022-09-26 DIAGNOSIS — I5032 Chronic diastolic (congestive) heart failure: Secondary | ICD-10-CM | POA: Diagnosis not present

## 2022-10-05 ENCOUNTER — Ambulatory Visit: Payer: Medicare Other | Admitting: Dermatology

## 2022-10-20 ENCOUNTER — Ambulatory Visit (INDEPENDENT_AMBULATORY_CARE_PROVIDER_SITE_OTHER): Payer: 59 | Admitting: Podiatry

## 2022-10-20 ENCOUNTER — Encounter: Payer: Self-pay | Admitting: Podiatry

## 2022-10-20 DIAGNOSIS — M79674 Pain in right toe(s): Secondary | ICD-10-CM

## 2022-10-20 DIAGNOSIS — N182 Chronic kidney disease, stage 2 (mild): Secondary | ICD-10-CM | POA: Diagnosis not present

## 2022-10-20 DIAGNOSIS — M79675 Pain in left toe(s): Secondary | ICD-10-CM | POA: Diagnosis not present

## 2022-10-20 DIAGNOSIS — B351 Tinea unguium: Secondary | ICD-10-CM

## 2022-10-20 NOTE — Progress Notes (Signed)
This patient presents  to my office for at risk foot care.  This patient requires this care by a professional since this patient will be at risk due to having diabetes.  Patient is under treatment for dermatitis by his dermatologist.  This patient is unable to cut nails himself since the patient cannot reach his nails.These nails are painful walking and wearing shoes.  This patient presents for at risk foot care today. He presents with an interpreter.  General Appearance  Alert, conversant and in no acute stress.  Vascular  Dorsalis pedis and posterior tibial  pulses are weakly palpable  bilaterally.  Capillary return is within normal limits  bilaterally. Cold feet. bilaterally.  Neurologic  Senn-Weinstein monofilament wire test within normal limits  bilaterally. Muscle power within normal limits bilaterally.  Nails Thick disfigured discolored nails with subungual debris  from hallux to fifth toes bilaterally. No evidence of bacterial infection or drainage bilaterally.  Orthopedic  No limitations of motion  feet .  No crepitus or effusions noted.  No bony pathology or digital deformities noted.  Skin  normotropic skin with no porokeratosis noted bilaterally.  No signs of infections or ulcers noted.     Onychomycosis  Pain in right toes  Pain in left toes  Dermatitis.  Consent was obtained for treatment procedures.   Mechanical debridement of nails 1-5  bilaterally performed with a nail nipper.  Filed with dremel without incident.    Return office visit      3 months                Told patient to return for periodic foot care and evaluation due to potential at risk complications.   Helane Gunther DPM

## 2022-11-28 DIAGNOSIS — L4059 Other psoriatic arthropathy: Secondary | ICD-10-CM | POA: Diagnosis not present

## 2022-11-28 DIAGNOSIS — L400 Psoriasis vulgaris: Secondary | ICD-10-CM | POA: Diagnosis not present

## 2022-12-23 ENCOUNTER — Other Ambulatory Visit: Payer: Self-pay | Admitting: Physician Assistant

## 2022-12-23 DIAGNOSIS — T8209XA Other mechanical complication of heart valve prosthesis, initial encounter: Secondary | ICD-10-CM

## 2022-12-23 NOTE — Telephone Encounter (Signed)
Prescription refill request for Eliquis received. Indication: Bioprosthetic valve dysfunction  Last office visit: 04/29/22 Janee Morn)  Scr: 1.07 (12/25/21)  Age: 89 Weight: 77.2kg  Appropriate dose. Refill sent.

## 2023-01-26 ENCOUNTER — Ambulatory Visit: Payer: 59 | Admitting: Podiatry

## 2023-02-02 DIAGNOSIS — R7303 Prediabetes: Secondary | ICD-10-CM | POA: Diagnosis not present

## 2023-02-02 DIAGNOSIS — K219 Gastro-esophageal reflux disease without esophagitis: Secondary | ICD-10-CM | POA: Diagnosis not present

## 2023-02-02 DIAGNOSIS — L4050 Arthropathic psoriasis, unspecified: Secondary | ICD-10-CM | POA: Diagnosis not present

## 2023-02-02 DIAGNOSIS — G309 Alzheimer's disease, unspecified: Secondary | ICD-10-CM | POA: Diagnosis not present

## 2023-02-02 DIAGNOSIS — I350 Nonrheumatic aortic (valve) stenosis: Secondary | ICD-10-CM | POA: Diagnosis not present

## 2023-02-02 DIAGNOSIS — I700 Atherosclerosis of aorta: Secondary | ICD-10-CM | POA: Diagnosis not present

## 2023-02-02 DIAGNOSIS — K7460 Unspecified cirrhosis of liver: Secondary | ICD-10-CM | POA: Diagnosis not present

## 2023-02-02 DIAGNOSIS — I110 Hypertensive heart disease with heart failure: Secondary | ICD-10-CM | POA: Diagnosis not present

## 2023-02-02 DIAGNOSIS — I5032 Chronic diastolic (congestive) heart failure: Secondary | ICD-10-CM | POA: Diagnosis not present

## 2023-02-06 DIAGNOSIS — H43811 Vitreous degeneration, right eye: Secondary | ICD-10-CM | POA: Diagnosis not present

## 2023-02-06 DIAGNOSIS — H26491 Other secondary cataract, right eye: Secondary | ICD-10-CM | POA: Diagnosis not present

## 2023-02-06 DIAGNOSIS — H35371 Puckering of macula, right eye: Secondary | ICD-10-CM | POA: Diagnosis not present

## 2023-02-22 ENCOUNTER — Ambulatory Visit (INDEPENDENT_AMBULATORY_CARE_PROVIDER_SITE_OTHER): Payer: 59 | Admitting: Podiatry

## 2023-02-22 DIAGNOSIS — B351 Tinea unguium: Secondary | ICD-10-CM

## 2023-02-22 DIAGNOSIS — M79674 Pain in right toe(s): Secondary | ICD-10-CM

## 2023-02-22 DIAGNOSIS — N182 Chronic kidney disease, stage 2 (mild): Secondary | ICD-10-CM

## 2023-02-22 DIAGNOSIS — M79675 Pain in left toe(s): Secondary | ICD-10-CM | POA: Diagnosis not present

## 2023-02-22 NOTE — Progress Notes (Signed)
This patient presents  to my office for at risk foot care.  This patient requires this care by a professional since this patient will be at risk due to having diabetes.  Patient is under treatment for dermatitis by his dermatologist.  This patient is unable to cut nails himself since the patient cannot reach his nails.These nails are painful walking and wearing shoes.  This patient presents for at risk foot care today. He presents with an interpreter.  General Appearance  Alert, conversant and in no acute stress.  Vascular  Dorsalis pedis and posterior tibial  pulses are weakly palpable  bilaterally.  Capillary return is within normal limits  bilaterally. Cold feet. bilaterally.  Neurologic  Senn-Weinstein monofilament wire test within normal limits  bilaterally. Muscle power within normal limits bilaterally.  Nails Thick disfigured discolored nails with subungual debris  from hallux to fifth toes bilaterally. No evidence of bacterial infection or drainage bilaterally.  Orthopedic  No limitations of motion  feet .  No crepitus or effusions noted.  No bony pathology or digital deformities noted.  Skin  normotropic skin with no porokeratosis noted bilaterally.  No signs of infections or ulcers noted.     Onychomycosis  Pain in right toes  Pain in left toes  Dermatitis.  Consent was obtained for treatment procedures.   Mechanical debridement of nails 1-5  bilaterally performed with a nail nipper.  Filed with dremel without incident.    Return office visit      3 months                Told patient to return for periodic foot care and evaluation due to potential at risk complications.   Helane Gunther DPM

## 2023-05-15 ENCOUNTER — Other Ambulatory Visit: Payer: Self-pay | Admitting: Cardiovascular Disease

## 2023-05-15 DIAGNOSIS — T8209XA Other mechanical complication of heart valve prosthesis, initial encounter: Secondary | ICD-10-CM

## 2023-05-16 NOTE — Telephone Encounter (Signed)
Prescription refill request for Eliquis received. Indication:TAVR Last office visit:needs appt OZD:GUYQI labs Age: 89 Weight:77.2  kg  Prescription refilled

## 2023-05-24 ENCOUNTER — Encounter: Payer: Self-pay | Admitting: Podiatry

## 2023-05-24 ENCOUNTER — Ambulatory Visit: Payer: 59 | Admitting: Podiatry

## 2023-05-24 ENCOUNTER — Ambulatory Visit (INDEPENDENT_AMBULATORY_CARE_PROVIDER_SITE_OTHER): Payer: 59 | Admitting: Podiatry

## 2023-05-24 DIAGNOSIS — L853 Xerosis cutis: Secondary | ICD-10-CM

## 2023-05-24 DIAGNOSIS — M79675 Pain in left toe(s): Secondary | ICD-10-CM | POA: Diagnosis not present

## 2023-05-24 DIAGNOSIS — M79674 Pain in right toe(s): Secondary | ICD-10-CM | POA: Diagnosis not present

## 2023-05-24 DIAGNOSIS — B351 Tinea unguium: Secondary | ICD-10-CM

## 2023-05-24 NOTE — Progress Notes (Signed)
This patient presents  to my office for at risk foot care.  This patient requires this care by a professional since this patient will be at risk due to having diabetes.  Patient is under treatment for dermatitis by his dermatologist.  This patient is unable to cut nails himself since the patient cannot reach his nails.These nails are painful walking and wearing shoes.  This patient presents for at risk foot care today. He presents with an interpreter.  General Appearance  Alert, conversant and in no acute stress.  Vascular  Dorsalis pedis and posterior tibial  pulses are weakly palpable  bilaterally.  Capillary return is within normal limits  bilaterally. Cold feet. bilaterally.  Neurologic  Senn-Weinstein monofilament wire test within normal limits  bilaterally. Muscle power within normal limits bilaterally.  Nails Thick disfigured discolored nails with subungual debris  from hallux to fifth toes bilaterally. No evidence of bacterial infection or drainage bilaterally.  Orthopedic  No limitations of motion  feet .  No crepitus or effusions noted.  No bony pathology or digital deformities noted.  Xerosis noted to bilateral foot skin epidermolysis noted no subjective component of itching noted  Onychomycosis  Pain in right toes  Pain in left toes  Dermatitis.  Consent was obtained for treatment procedures.   Mechanical debridement of nails 1-5  bilaterally performed with a nail nipper.  Filed with dremel without incident.    Return office visit      3 months                Told patient to return for periodic foot care and evaluation due to potential at risk complications.  I explained to the patient the etiology of xerosis and various treatment options were extensively discussed.  I explained to the patient the importance of maintaining moisturization of the skin with application of over-the-counter lotion such as Eucerin or Luciderm.  I have asked the patient to apply this twice a day.  If unable  to resolve patient will benefit from prescription lotion. Patient   Helane Gunther DPM

## 2023-05-29 DIAGNOSIS — L400 Psoriasis vulgaris: Secondary | ICD-10-CM | POA: Diagnosis not present

## 2023-06-13 ENCOUNTER — Other Ambulatory Visit: Payer: Self-pay | Admitting: Cardiovascular Disease

## 2023-06-13 DIAGNOSIS — T8209XA Other mechanical complication of heart valve prosthesis, initial encounter: Secondary | ICD-10-CM

## 2023-06-15 NOTE — Telephone Encounter (Signed)
 Prescription refill request for Eliquis received. Indication: AVR

## 2023-07-12 DIAGNOSIS — R946 Abnormal results of thyroid function studies: Secondary | ICD-10-CM | POA: Diagnosis not present

## 2023-07-12 DIAGNOSIS — D84821 Immunodeficiency due to drugs: Secondary | ICD-10-CM | POA: Diagnosis not present

## 2023-07-12 DIAGNOSIS — I700 Atherosclerosis of aorta: Secondary | ICD-10-CM | POA: Diagnosis not present

## 2023-07-12 DIAGNOSIS — Z79899 Other long term (current) drug therapy: Secondary | ICD-10-CM | POA: Diagnosis not present

## 2023-07-12 DIAGNOSIS — R7303 Prediabetes: Secondary | ICD-10-CM | POA: Diagnosis not present

## 2023-07-12 DIAGNOSIS — I5032 Chronic diastolic (congestive) heart failure: Secondary | ICD-10-CM | POA: Diagnosis not present

## 2023-07-12 DIAGNOSIS — K7460 Unspecified cirrhosis of liver: Secondary | ICD-10-CM | POA: Diagnosis not present

## 2023-07-12 DIAGNOSIS — Z23 Encounter for immunization: Secondary | ICD-10-CM | POA: Diagnosis not present

## 2023-07-12 DIAGNOSIS — I10 Essential (primary) hypertension: Secondary | ICD-10-CM | POA: Diagnosis not present

## 2023-07-12 DIAGNOSIS — I351 Nonrheumatic aortic (valve) insufficiency: Secondary | ICD-10-CM | POA: Diagnosis not present

## 2023-07-12 DIAGNOSIS — G309 Alzheimer's disease, unspecified: Secondary | ICD-10-CM | POA: Diagnosis not present

## 2023-07-21 ENCOUNTER — Telehealth: Payer: Self-pay

## 2023-07-21 NOTE — Telephone Encounter (Signed)
 done

## 2023-07-27 ENCOUNTER — Other Ambulatory Visit: Payer: Self-pay

## 2023-07-27 DIAGNOSIS — T8209XA Other mechanical complication of heart valve prosthesis, initial encounter: Secondary | ICD-10-CM

## 2023-07-27 MED ORDER — APIXABAN 5 MG PO TABS: 5.0000 mg | ORAL_TABLET | Freq: Two times a day (BID) | ORAL | 1 refills | Status: DC

## 2023-07-27 NOTE — Telephone Encounter (Addendum)
Prescription refill request for Eliquis received. Indication: Bioprosthetic valve dysfunction  Last office visit: 04/29/22 [name])  Scr: 1.07 (12/25/21)  Age: 89 Weight: 77.2kg  Labs and office visit overdue. Message sent to schedulers. Called PCP and requested most request labs.   Scr 1.34 on 07/12/23. Per dosing criteria, Eliquis 5mg  BID is appropriate.

## 2023-07-27 NOTE — Telephone Encounter (Signed)
Pt has scheduled appt with Robin Searing, NP on 09/11/23. Refill sent.

## 2023-08-15 ENCOUNTER — Ambulatory Visit: Payer: 59 | Admitting: Audiologist

## 2023-08-22 ENCOUNTER — Ambulatory Visit (INDEPENDENT_AMBULATORY_CARE_PROVIDER_SITE_OTHER): Payer: 59 | Admitting: Podiatry

## 2023-08-22 ENCOUNTER — Encounter: Payer: Self-pay | Admitting: Podiatry

## 2023-08-22 DIAGNOSIS — B351 Tinea unguium: Secondary | ICD-10-CM | POA: Diagnosis not present

## 2023-08-22 DIAGNOSIS — M79674 Pain in right toe(s): Secondary | ICD-10-CM

## 2023-08-22 DIAGNOSIS — N182 Chronic kidney disease, stage 2 (mild): Secondary | ICD-10-CM

## 2023-08-22 DIAGNOSIS — M79675 Pain in left toe(s): Secondary | ICD-10-CM | POA: Diagnosis not present

## 2023-08-22 NOTE — Progress Notes (Signed)
 This patient presents  to my office for at risk foot care.  This patient requires this care by a professional since this patient will be at risk due to having diabetes.  Patient is under treatment for dermatitis by his dermatologist.  This patient is unable to cut nails himself since the patient cannot reach his nails.These nails are painful walking and wearing shoes.  This patient presents for at risk foot care today. He presents with an interpreter.  General Appearance  Alert, conversant and in no acute stress.  Vascular  Dorsalis pedis and posterior tibial  pulses are weakly palpable  bilaterally.  Capillary return is within normal limits  bilaterally. Cold feet. bilaterally.  Neurologic  Senn-Weinstein monofilament wire test within normal limits  bilaterally. Muscle power within normal limits bilaterally.  Nails Thick disfigured discolored nails with subungual debris  from hallux to fifth toes bilaterally. No evidence of bacterial infection or drainage bilaterally.  Orthopedic  No limitations of motion  feet .  No crepitus or effusions noted.  No bony pathology or digital deformities noted.  Skin  normotropic skin with no porokeratosis noted bilaterally.  No signs of infections or ulcers noted. Dry scaly skin dorsum of left foot.    Onychomycosis  Pain in right toes  Pain in left toes  Dermatitis.  Consent was obtained for treatment procedures.   Mechanical debridement of nails 1-5  bilaterally performed with a nail nipper.  Filed with dremel without incident.  Foot miracle foot miracle.   Return office visit      3 months                Told patient to return for periodic foot care and evaluation due to potential at risk complications.   Helane Gunther DPM

## 2023-08-28 DIAGNOSIS — L400 Psoriasis vulgaris: Secondary | ICD-10-CM | POA: Diagnosis not present

## 2023-08-28 DIAGNOSIS — L4059 Other psoriatic arthropathy: Secondary | ICD-10-CM | POA: Diagnosis not present

## 2023-09-10 NOTE — Progress Notes (Unsigned)
 Cardiology Office Note    Patient Name: [name] Date of Encounter: 09/11/2023  Primary Care Provider:  Merlene Laughter, MD (Inactive) Primary Cardiologist:  Donato Schultz, MD Primary Electrophysiologist: None   Past Medical History    Past Medical History:  Diagnosis Date   BPH (benign prostatic hypertrophy)    Depression    Diverticulosis of colon    GERD (gastroesophageal reflux disease)    Gilbert's syndrome    History of kidney stones    HLD (hyperlipidemia)    HOH (hard of hearing)    refuses to wear his Hearing Aid   Hypertension    Inguinal hernia    right   Internal hemorrhoid    Normal coronary arteries    a. Cath 07/2010: normal coronaries. Felt to have noncardiac CP/SOB at that time possibly r/t anxiety.   RA (rheumatoid arthritis) (HCC)    bilateral hands/ wrist--  seronegative   S/P TAVR (transcatheter aortic valve replacement) 03/27/2018   23 mm Edwards Sapien 3 transcatheter heart valve placed via percutaneous right transfemoral approach    Severe aortic stenosis    Thyroid nodule    noted 11-2009   Wears dentures     History of Present Illness  [name] is a 89 y.o. male with a PMH of severe AS s/p TAVR 03/2018, HTN, HLD, RA (on methotrexate), GERD, depression who presents today for follow-up.  [name] was recently followed by [facility] and is currently followed by Dr. Anne Fu for cardiology management. He is originally from [place]. He moved to the [place] in 1991. 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  He was seen in the ED with complaint of substernal chest pain with troponin and EKG.  He required a prolonged admission on 08/2016 after suffering a mechanical fall from a tree fracturing vertebrae.  He required right thoracentesis due to right pleural effusion.  He was also found to have small DVT and was treated with Lovenox and Xarelto.   He was seen in the ED on 02/04/2018 with complaint of chest pain and abdominal pain.  He underwent 2D echo that showed severe aortic stenosis with mean gradient of 43 mmHg.  He was evaluated by the structural heart team TAVR workup with subsequent procedure on 03/27/2018 with a 23 mm Edwards SAPIEN 3 valve.  2D echo completed 1 month later showed EF of 60% with normal functioning TAVR and mean gradient of 14 mm with new moderate perivalvular regurg.  He was evaluated by Dr. Constance Goltz as well as Clifton James who felt nothing was needed to be done at that time due to absence of CHF symptoms.  He did well postprocedure and was admitted on 12/21/2021 with near syncopal episode.  She was also found to have sinus bradycardia with negative orthostatic vitals.  He underwent TEE that showed EF of 60-65% mild-mod PVL, mean gradient of and acceleration time <136ms significant for hypoattenuated leaflets thickening.  He was placed on [employer] and he had an MRI of the head completed that showed subacute infarct in the left parietal lobe deemed to be incidental finding.  He was recommended to follow-up with neurology and start ASA 81 mg.  He was evaluated by neurology and found to have delirium with poor prognosis and outpatient palliative care recommended.  He was last seen by Dr. Anne Fu on 01/28/2022 family reported profound memory loss with Aricept  being discontinued due to vomiting.  He was walking for activity but slower than normal.  He was found to be euvolemic with recommendation to avoid diuretics due to vasovagal syncope continue conservative management.  He was seen by Unknown Foley, PA on 04/29/2022 and reported feeling much better but balance still off but planning to resume walking.  He had significant improvement after starting [facility] however patient's daughter was worried about long-term risk of bleeding due to gait imbalance and fall risk.  He was noted to have elevated BP with no changes made due to fall risk and  history of chronic dizziness.  [name] presents today with an interpreter and his daughter for annual follow-up.  During today's visit his daughter provided the majority of his history.  She reports that his blood pressures have been elevated with BP today at 168/70 and was 142/78 on recheck.  He has been compliant with his current medications and denies any adverse reactions.  He is maintaining stable weights but does endorse some shortness of breath with changing his socks.  He also has distention in his lower abdomen which his daughter feels is related to constipation.  He is otherwise euvolemic on examination today.  During today's visit we discussed the possibility of adjusting his BP medications in the future if blood pressures remain elevated.  He completed labs at his PCP with normal BNP and creatinine of 1.3.  He was advised to increase his fluid intake which is limited due to his desire to drink only tea.  He also continues to have instability in his gait and was interested in completing PT OT but his insurance is not excepted.  I advised his daughter to contact his PCP to arrange for new referral with company excepting his insurance. Patient denies chest pain, palpitations, dyspnea, PND, orthopnea, nausea, vomiting, dizziness, syncope, edema, weight gain, or early satiety. History of Present Illness   Review of Systems  Please see the history of present illness.    All other systems reviewed and are otherwise negative except as noted above.  Physical Exam    Wt Readings from Last 3 Encounters:  09/11/23 175 lb 3.2 oz (79.5 kg)  04/29/22 170 lb 2 oz (77.2 kg)  02/16/22 169 lb (76.7 kg)   VS: Vitals:   09/11/23 1606  BP: (!) 168/70  Pulse: (!) 58  SpO2: 94%  ,Body mass index is 28.28 kg/m. GEN: Well nourished, well developed in no acute distress Neck: No JVD; No carotid bruits Pulmonary: Clear to auscultation without rales, wheezing or rhonchi  Cardiovascular: Normal rate.  Regular rhythm. Normal S1. Normal S2.   Murmurs: There is no murmur.  ABDOMEN: Soft, non-tender, non-distended EXTREMITIES:  No edema; No deformity   EKG/LABS/ Recent Cardiac Studies   ECG personally reviewed by me today -sinus bradycardia with rate of 58 BPM and no acute changes consistent with previous EKG.  Risk Assessment/Calculations:       Lab Results  Component Value Date   WBC 6.1 12/25/2021   HGB 13.7 12/25/2021   HCT 42.4 12/25/2021   MCV 90.0 12/25/2021   PLT 145 (L) 12/25/2021   Lab Results  Component Value Date   CREATININE 1.07 12/25/2021   BUN 24 (H) 12/25/2021   NA 138 12/25/2021   K 4.4 12/25/2021   CL 104 12/25/2021   CO2 28 12/25/2021   Lab Results  Component Value Date   CHOL 161 12/25/2021   HDL 57 12/25/2021   LDLCALC 92 12/25/2021  TRIG 61 12/25/2021   CHOLHDL 2.8 12/25/2021    Lab Results  Component Value Date   HGBA1C 6.0 (H) 12/25/2021   Assessment & Plan    1.  History of nonrheumatic AS: -s/p TAVR in 2019 bioprosthetic valve dysfunction secondary to HALT/HAM with mean gradient of 60 mmHg and OAC started and providing valve stenosis improved to 16 mmHg with clinical improvement. -Today patient is euvolemic but notes some shortness of breath with exertion such as putting on his socks. -We will order a 2D echo for surveillance of aortic stenosis -Continue Eliquis 5 mg twice daily  2.  HFpEF: -2D echo completed showing EF of 70-75% and improvement of prosthetic valve stenosis with gradient of 16 mmHg improved from 45 mmHg on prior echo. -Patient is euvolemic on exam with some abdominal distention felt to be related to possible constipation -We will prescribe Lasix 20 mg as needed along with 10 mEq of potassium to be taken with Lasix dose -Low sodium diet, fluid restriction <2L, and daily weights encouraged. Educated to contact our office for weight gain of 2 lbs overnight or 5 lbs in one week.   3.  Essential HTN: -Patient blood pressure  today was initially elevated at 168/70 and was 142/64 on recheck. -Continue losartan 25 mg daily -Patient and daughter advised to monitor BP at home and contact office with readings.  4.  Hyperlipidemia: -Patient's last LDL cholesterol was 92 -Continue heart healthy diet  Disposition: Follow-up with Donato Schultz, MD or APP in 6 months    Signed, Napoleon Form, Leodis Rains, NP 09/11/2023, 4:21 PM [facility]

## 2023-09-11 ENCOUNTER — Other Ambulatory Visit: Payer: Self-pay | Admitting: Nurse Practitioner

## 2023-09-11 ENCOUNTER — Ambulatory Visit: Payer: Medicaid Other | Attending: Nurse Practitioner | Admitting: Nurse Practitioner

## 2023-09-11 ENCOUNTER — Encounter: Payer: Self-pay | Admitting: Nurse Practitioner

## 2023-09-11 VITALS — BP 142/64 | HR 58 | Ht 66.0 in | Wt 175.2 lb

## 2023-09-11 DIAGNOSIS — I5032 Chronic diastolic (congestive) heart failure: Secondary | ICD-10-CM

## 2023-09-11 DIAGNOSIS — Z952 Presence of prosthetic heart valve: Secondary | ICD-10-CM

## 2023-09-11 DIAGNOSIS — T8209XD Other mechanical complication of heart valve prosthesis, subsequent encounter: Secondary | ICD-10-CM | POA: Diagnosis not present

## 2023-09-11 DIAGNOSIS — E785 Hyperlipidemia, unspecified: Secondary | ICD-10-CM

## 2023-09-11 DIAGNOSIS — T8209XA Other mechanical complication of heart valve prosthesis, initial encounter: Secondary | ICD-10-CM

## 2023-09-11 DIAGNOSIS — I10 Essential (primary) hypertension: Secondary | ICD-10-CM | POA: Diagnosis not present

## 2023-09-11 MED ORDER — FUROSEMIDE 20 MG PO TABS: 20.0000 mg | ORAL_TABLET | Freq: Every day | ORAL | 0 refills | Status: DC | PRN

## 2023-09-11 MED ORDER — POTASSIUM CHLORIDE ER 10 MEQ PO TBCR: 10.0000 meq | EXTENDED_RELEASE_TABLET | Freq: Every day | ORAL | 0 refills | Status: DC

## 2023-09-11 NOTE — Patient Instructions (Signed)
 Medication Instructions:  START Lasix 20mg  Take 1 tablet daily as needed START Potassium Only take when taking Lasix  *If you need a refill on your cardiac medications before your next appointment, please call your pharmacy*  Lab Work: BMET-1 week  If you have labs (blood work) drawn today and your tests are completely normal, you will receive your results only by: MyChart Message (if you have MyChart) OR A paper copy in the mail If you have any lab test that is abnormal or we need to change your treatment, we will call you to review the results.  Testing/Procedures: Your physician has requested that you have an echocardiogram. Echocardiography is a painless test that uses sound waves to create images of your heart. It provides your doctor with information about the size and shape of your heart and how well your heart's chambers and valves are working. This procedure takes approximately one hour. There are no restrictions for this procedure. Please do NOT wear cologne, perfume, aftershave, or lotions (deodorant is allowed). Please arrive 15 minutes prior to your appointment time.  Please note: We ask at that you not bring children with you during ultrasound (echo/ vascular) testing. Due to room size and safety concerns, children are not allowed in the ultrasound rooms during exams. Our front office staff cannot provide observation of children in our lobby area while testing is being conducted. An adult accompanying a patient to their appointment will only be allowed in the ultrasound room at the discretion of the ultrasound technician under special circumstances. We apologize for any inconvenience.  Follow-Up: At [facility], you and your health needs are our priority.  As part of our continuing mission to provide you with exceptional heart care, our providers are all part of one team.  This team includes your primary Cardiologist (physician) and Advanced Practice Providers or  APPs (Physician Assistants and Nurse Practitioners) who all work together to provide you with the care you need, when you need it.  Your next appointment:   6 month(s)  Provider:   Donato Schultz, MD     We recommend signing up for the patient portal called "MyChart".  Sign up information is provided on this After Visit Summary.  MyChart is used to connect with patients for Virtual Visits (Telemedicine).  Patients are able to view lab/test results, encounter notes, upcoming appointments, etc.  Non-urgent messages can be sent to your provider as well.   To learn more about what you can do with MyChart, go to [email or web address].   Other Instructions Please check your weight daily. Please contact the office if you gain more than 2lbs in a day or 5lbs in a week.  Limit your salt intake to 1500-2000mg  per day or 500mg  of Sodium per meal.       1st Floor: - Lobby - Registration  - Pharmacy  - Lab - Cafe  2nd Floor: - PV Lab - Diagnostic Testing (echo, CT, nuclear med)  3rd Floor: - Vacant  4th Floor: - TCTS (cardiothoracic surgery) - AFib Clinic - Structural Heart Clinic - Vascular Surgery  - Vascular Ultrasound  5th Floor: - HeartCare Cardiology (general and EP) - Clinical Pharmacy for coumadin, hypertension, lipid, weight-loss medications, and med management appointments    Valet parking services will be available as well.

## 2023-09-20 ENCOUNTER — Ambulatory Visit: Attending: Internal Medicine | Admitting: Audiologist

## 2023-09-20 DIAGNOSIS — H903 Sensorineural hearing loss, bilateral: Secondary | ICD-10-CM | POA: Insufficient documentation

## 2023-09-20 NOTE — Procedures (Signed)
  Outpatient Audiology and [facility] [address] [place], [place]  [address] [phone]  AUDIOLOGICAL  EVALUATION  NAME: [name]     DOB:   [birth year]      MRN: [id number]                                                                                     DATE: 09/20/2023     REFERENT: Merlene Laughter, MD (Inactive) STATUS: Outpatient DIAGNOSIS: Sensorineural Hearing Loss   History: [name] was seen for an audiological evaluation due to difficulty hearing. When scheduling [name]'s family declined interpreting and said the daughter would interpret. Interpretor waiver signed and daughter spoke with patient. Charan's daughter brought [name] aids under the impression we would be fixing the aids for him to hear better. They did not know who fit the aids originally. On scheduling they were told [name] does not work with hearing aids. [name] only provides testing. They have to see the original fitting audiologist about the hearing aids. [name] has not heard for many years. He does not like the hearing aids, he would prefer not to use them at all. No other case history reported.    Evaluation:  Otoscopy showed a clear view of the tympanic membranes, bilaterally Tympanometry results were consistent with normal middle ear function.  Audiometric testing was completed using Conventional Audiometry techniques with insert earphones and supraural headphones. Test results are consistent with moderate to severe sensorineural hearing loss in both ears. Speech testing not performed.    Results:  The test results were reviewed with [name] and his daughter. [name] has severe hearing loss. He needs to wear hearing aid DAILY for benefit. Random occasional use will do nothing to help him hear. He will not be able to tolerate the volume he needs. Demoed the [employer] Loud Ear amplified headphone. [name]'s daughter can keep it with her to have [name] wear when she would like him to hear her better. He is not  likely to do better with new aids if he never wears them.  Audiogram printed and provided to [name].    Recommendations: Recommend using [employer] Loud Ear to help [name] hear since he is not using hearing aids: [email or web address]  Only pursue new hearing aids if [name] is willing to use them every day. Otherwise no matter how new the aids, they will not help.   38 minutes spent testing and counseling on results.   If you have any questions please feel free to contact me at [phone].  Ammie Ferrier Stalnaker Au.D.  Audiologist   09/20/2023  4:12 PM  Cc: Merlene Laughter, MD (Inactive)

## 2023-10-16 ENCOUNTER — Ambulatory Visit (HOSPITAL_COMMUNITY): Attending: Cardiovascular Disease

## 2023-10-16 DIAGNOSIS — E785 Hyperlipidemia, unspecified: Secondary | ICD-10-CM | POA: Insufficient documentation

## 2023-10-16 DIAGNOSIS — Z952 Presence of prosthetic heart valve: Secondary | ICD-10-CM | POA: Insufficient documentation

## 2023-10-16 DIAGNOSIS — T8209XA Other mechanical complication of heart valve prosthesis, initial encounter: Secondary | ICD-10-CM | POA: Insufficient documentation

## 2023-10-16 DIAGNOSIS — I5032 Chronic diastolic (congestive) heart failure: Secondary | ICD-10-CM | POA: Insufficient documentation

## 2023-10-16 DIAGNOSIS — I10 Essential (primary) hypertension: Secondary | ICD-10-CM | POA: Insufficient documentation

## 2023-10-16 LAB — ECHOCARDIOGRAM COMPLETE
AR max vel: 1.38 cm2
AV Area VTI: 1.51 cm2
AV Area mean vel: 1.52 cm2
AV Mean grad: 15 mmHg
AV Peak grad: 30.7 mmHg
Ao pk vel: 2.77 m/s
Area-P 1/2: 1.69 cm2
Est EF: >75
MV VTI: 1.97 cm2
P 1/2 time: 308 ms
S' Lateral: 1.7 cm

## 2023-11-22 ENCOUNTER — Ambulatory Visit (INDEPENDENT_AMBULATORY_CARE_PROVIDER_SITE_OTHER): Admitting: Podiatry

## 2023-11-22 ENCOUNTER — Encounter: Payer: Self-pay | Admitting: Podiatry

## 2023-11-22 DIAGNOSIS — B351 Tinea unguium: Secondary | ICD-10-CM

## 2023-11-22 DIAGNOSIS — N182 Chronic kidney disease, stage 2 (mild): Secondary | ICD-10-CM

## 2023-11-22 DIAGNOSIS — M79674 Pain in right toe(s): Secondary | ICD-10-CM

## 2023-11-22 DIAGNOSIS — M79675 Pain in left toe(s): Secondary | ICD-10-CM | POA: Diagnosis not present

## 2023-11-22 NOTE — Progress Notes (Signed)
 This patient presents  to my office for at risk foot care.  This patient requires this care by a professional since this patient will be at risk due to having diabetes.  Patient is under treatment for dermatitis by his dermatologist.  This patient is unable to cut nails himself since the patient cannot reach his nails.These nails are painful walking and wearing shoes.  This patient presents for at risk foot care today. He presents with an interpreter.  General Appearance  Alert, conversant and in no acute stress.  Vascular  Dorsalis pedis and posterior tibial  pulses are weakly palpable  bilaterally.  Capillary return is within normal limits  bilaterally. Cold feet. bilaterally.  Neurologic  Senn-Weinstein monofilament wire test within normal limits  bilaterally. Muscle power within normal limits bilaterally.  Nails Thick disfigured discolored nails with subungual debris  from hallux to fifth toes bilaterally. No evidence of bacterial infection or drainage bilaterally.  Orthopedic  No limitations of motion  feet .  No crepitus or effusions noted.  No bony pathology or digital deformities noted.  Skin  normotropic skin with no porokeratosis noted bilaterally.  No signs of infections or ulcers noted.    Onychomycosis  Pain in right toes  Pain in left toes   Consent was obtained for treatment procedures.   Mechanical debridement of nails 1-5  bilaterally performed with a nail nipper.  Filed with dremel without incident.     Return office visit      3 months                Told patient to return for periodic foot care and evaluation due to potential at risk complications.   Ruffin Cotton DPM

## 2024-01-10 ENCOUNTER — Other Ambulatory Visit: Payer: Self-pay | Admitting: Cardiovascular Disease

## 2024-01-10 DIAGNOSIS — T8209XA Other mechanical complication of heart valve prosthesis, initial encounter: Secondary | ICD-10-CM

## 2024-01-11 NOTE — Telephone Encounter (Signed)
 Pt last saw Jackee Alberts, NP on 09/11/23, last labs 07/12/23 Creat 1.34 at [place], age 89, weight 79.5kg, based on specified criteria pt is on appropriate dosage of Eliquis  5mg  BID for bioprosthetic valve dysfunction.  Will refill rx.

## 2024-01-22 ENCOUNTER — Other Ambulatory Visit: Payer: Self-pay | Admitting: Cardiovascular Disease

## 2024-01-22 DIAGNOSIS — T8209XA Other mechanical complication of heart valve prosthesis, initial encounter: Secondary | ICD-10-CM

## 2024-01-23 DIAGNOSIS — H35371 Puckering of macula, right eye: Secondary | ICD-10-CM | POA: Diagnosis not present

## 2024-01-23 DIAGNOSIS — H11001 Unspecified pterygium of right eye: Secondary | ICD-10-CM | POA: Diagnosis not present

## 2024-01-23 DIAGNOSIS — H348312 Tributary (branch) retinal vein occlusion, right eye, stable: Secondary | ICD-10-CM | POA: Diagnosis not present

## 2024-01-23 DIAGNOSIS — H26491 Other secondary cataract, right eye: Secondary | ICD-10-CM | POA: Diagnosis not present

## 2024-02-07 ENCOUNTER — Encounter: Payer: Self-pay | Admitting: Cardiology

## 2024-02-07 ENCOUNTER — Ambulatory Visit: Attending: Cardiovascular Disease | Admitting: Cardiology

## 2024-02-07 VITALS — BP 168/64 | HR 63 | Ht 62.6 in | Wt 176.2 lb

## 2024-02-07 DIAGNOSIS — Z952 Presence of prosthetic heart valve: Secondary | ICD-10-CM

## 2024-02-07 DIAGNOSIS — T8209XD Other mechanical complication of heart valve prosthesis, subsequent encounter: Secondary | ICD-10-CM | POA: Diagnosis not present

## 2024-02-07 DIAGNOSIS — T8209XA Other mechanical complication of heart valve prosthesis, initial encounter: Secondary | ICD-10-CM

## 2024-02-07 DIAGNOSIS — I10 Essential (primary) hypertension: Secondary | ICD-10-CM | POA: Diagnosis not present

## 2024-02-07 MED ORDER — APIXABAN 5 MG PO TABS: 5.0000 mg | ORAL_TABLET | Freq: Two times a day (BID) | ORAL | 1 refills | Status: AC

## 2024-02-07 MED ORDER — LOSARTAN POTASSIUM 50 MG PO TABS: 50.0000 mg | ORAL_TABLET | Freq: Every day | ORAL | 3 refills | Status: AC

## 2024-02-07 NOTE — Progress Notes (Signed)
  Cardiology Office Note:  .   Date:  02/07/2024  ID:  [name], DOB 02/10/1928, MRN [id number] PCP: Roseann Coad, MD (Inactive)  [facility] Providers Cardiologist:  Oneil Parchment, MD Structural Heart:  Lonni Cash, MD    History of Present Illness: .   [name] is a 89 y.o. male Discussed the use of AI scribe software for clinical note transcription with the patient, who gave verbal consent to proceed.  History of Present Illness [name] is a 89 year old male with hypertension and bioprosthetic valve dysfunction who presents for cardiovascular follow-up.  He has a history of hypertension that has been challenging to control, with recent home blood pressure readings around 150 mmHg systolic. He is currently taking losartan  25 mg daily. He experiences occasional headaches and unsteadiness, though it is unclear if these symptoms are related to his blood pressure fluctuations.  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 After starting oral anticoagulation, the gradient improved to 16 mmHg. His last echocardiogram on Oct 16, 2023, confirmed stability of his valve function with a mean gradient of 16 mmHg. He takes Eliquis  for anticoagulation.  He is on methotrexate  for rheumatoid arthritis. He experiences some balance issues, which may be related to his age or other medical conditions. His family would like him to be more active and have physical therapy, but his primary care doctor has been unable to find a facility compatible with his insurance.      Studies Reviewed: .        Results DIAGNOSTIC Echocardiogram: Mean gradient 16 mmHg (10/16/2023) Risk Assessment/Calculations:           Physical Exam:   VS:  BP (!) 168/64   Pulse 63   Ht 5' 2.6 (1.59 m)   Wt 176 lb 3.2 oz (79.9 kg)   SpO2 96%   BMI 31.61 kg/m    Wt Readings from Last 3 Encounters:  02/07/24 176 lb 3.2 oz  (79.9 kg)  09/11/23 175 lb 3.2 oz (79.5 kg)  04/29/22 170 lb 2 oz (77.2 kg)    GEN: Well nourished, well developed in no acute distress NECK: No JVD; No carotid bruits CARDIAC: RRR, no murmurs, no rubs, no gallops RESPIRATORY:  Clear to auscultation without rales, wheezing or rhonchi  ABDOMEN: Soft, non-tender, non-distended EXTREMITIES:  No edema; No deformity   ASSESSMENT AND PLAN: .    Assessment and Plan Assessment & Plan Bioprosthetic aortic valve dysfunction post-TAVR Bioprosthetic valve dysfunction secondary to HALT/HAM with previous mean gradient of 60 mmHg, improved to 16 mmHg with oral anticoagulation. Last echocardiogram on Oct 16, 2023, showed a mean gradient of 16 mmHg, indicating well-managed valve function. - Renew prescription for Eliquis  to protect valve.  Hypertension Blood pressure is slightly elevated at 150/64 mmHg. Current medication is losartan  25 mg, which is the starting dose. There is concern about the potential for blood pressure to become too low if the dose is increased. Headaches are unlikely to be related to blood pressure. - Increase losartan  to 50 mg daily and monitor blood pressure at home. - If he experiences symptoms of hypotension, revert to losartan  25 mg.           Signed, Oneil Parchment, MD

## 2024-02-07 NOTE — Patient Instructions (Signed)
 Medication Instructions:  Please increase your Losartan  to 50 mg daily. Continue all other medications as listed.  *If you need a refill on your cardiac medications before your next appointment, please call your pharmacy*  Follow-Up: At [facility], you and your health needs are our priority.  As part of our continuing mission to provide you with exceptional heart care, our providers are all part of one team.  This team includes your primary Cardiologist (physician) and Advanced Practice Providers or APPs (Physician Assistants and Nurse Practitioners) who all work together to provide you with the care you need, when you need it.  Your next appointment:   1 year(s)  Provider:   One of our Advanced Practice Providers (APPs): Morse Clause, PA-C  Lamarr Satterfield, NP Miriam Shams, NP  Olivia Pavy, PA-C Josefa Beauvais, NP  Leontine Salen, PA-C Orren Fabry, PA-C  Callaghan, PA-C Ernest Dick, NP  Damien Braver, NP Jon Hails, PA-C  Waddell Donath, PA-C    Dayna Dunn, PA-C  Scott Weaver, PA-C Lum Louis, NP Katlyn West, NP Callie Goodrich, PA-C  Evan Williams, PA-C Sheng Haley, PA-C  Xika Zhao, NP Kathleen Johnson, PA-C       We recommend signing up for the patient portal called MyChart.  Sign up information is provided on this After Visit Summary.  MyChart is used to connect with patients for Virtual Visits (Telemedicine).  Patients are able to view lab/test results, encounter notes, upcoming appointments, etc.  Non-urgent messages can be sent to your provider as well.   To learn more about what you can do with MyChart, go to [email or web address].

## 2024-02-09 DIAGNOSIS — L4050 Arthropathic psoriasis, unspecified: Secondary | ICD-10-CM | POA: Diagnosis not present

## 2024-02-09 DIAGNOSIS — I351 Nonrheumatic aortic (valve) insufficiency: Secondary | ICD-10-CM | POA: Diagnosis not present

## 2024-02-09 DIAGNOSIS — K7460 Unspecified cirrhosis of liver: Secondary | ICD-10-CM | POA: Diagnosis not present

## 2024-02-09 DIAGNOSIS — D84821 Immunodeficiency due to drugs: Secondary | ICD-10-CM | POA: Diagnosis not present

## 2024-02-09 DIAGNOSIS — N1831 Chronic kidney disease, stage 3a: Secondary | ICD-10-CM | POA: Diagnosis not present

## 2024-02-09 DIAGNOSIS — R7303 Prediabetes: Secondary | ICD-10-CM | POA: Diagnosis not present

## 2024-02-09 DIAGNOSIS — I5032 Chronic diastolic (congestive) heart failure: Secondary | ICD-10-CM | POA: Diagnosis not present

## 2024-02-09 DIAGNOSIS — I350 Nonrheumatic aortic (valve) stenosis: Secondary | ICD-10-CM | POA: Diagnosis not present

## 2024-02-09 DIAGNOSIS — I10 Essential (primary) hypertension: Secondary | ICD-10-CM | POA: Diagnosis not present

## 2024-02-09 DIAGNOSIS — G309 Alzheimer's disease, unspecified: Secondary | ICD-10-CM | POA: Diagnosis not present

## 2024-02-22 ENCOUNTER — Ambulatory Visit: Admitting: Podiatry

## 2024-03-05 ENCOUNTER — Ambulatory Visit: Admitting: Cardiology

## 2024-03-25 ENCOUNTER — Encounter: Payer: Self-pay | Admitting: Podiatry

## 2024-03-25 ENCOUNTER — Ambulatory Visit (INDEPENDENT_AMBULATORY_CARE_PROVIDER_SITE_OTHER): Admitting: Podiatry

## 2024-03-25 DIAGNOSIS — M79675 Pain in left toe(s): Secondary | ICD-10-CM

## 2024-03-25 DIAGNOSIS — M79674 Pain in right toe(s): Secondary | ICD-10-CM | POA: Diagnosis not present

## 2024-03-25 DIAGNOSIS — B351 Tinea unguium: Secondary | ICD-10-CM

## 2024-03-25 DIAGNOSIS — N182 Chronic kidney disease, stage 2 (mild): Secondary | ICD-10-CM | POA: Diagnosis not present

## 2024-03-25 NOTE — Progress Notes (Signed)
 This patient presents  to my office for at risk foot care.  This patient requires this care by a professional since this patient will be at risk due to having diabetes.  Patient is under treatment for dermatitis by his dermatologist.  This patient is unable to cut nails himself since the patient cannot reach his nails.These nails are painful walking and wearing shoes.  This patient presents for at risk foot care today. He presents with an interpreter.  General Appearance  Alert, conversant and in no acute stress.  Vascular  Dorsalis pedis and posterior tibial  pulses are weakly palpable  bilaterally.  Capillary return is within normal limits  bilaterally. Cold feet. bilaterally.  Neurologic  Senn-Weinstein monofilament wire test within normal limits  bilaterally. Muscle power within normal limits bilaterally.  Nails Thick disfigured discolored nails with subungual debris  from hallux to fifth toes bilaterally. No evidence of bacterial infection or drainage bilaterally.  Orthopedic  No limitations of motion  feet .  No crepitus or effusions noted.  No bony pathology or digital deformities noted.  Skin  normotropic skin with no porokeratosis noted bilaterally.  No signs of infections or ulcers noted.    Onychomycosis  Pain in right toes  Pain in left toes   Consent was obtained for treatment procedures.   Mechanical debridement of nails 1-5  bilaterally performed with a nail nipper.  Filed with dremel without incident.     Return office visit      3 months                Told patient to return for periodic foot care and evaluation due to potential at risk complications.   Ruffin Cotton DPM

## 2024-06-18 ENCOUNTER — Emergency Department (HOSPITAL_COMMUNITY)

## 2024-06-18 ENCOUNTER — Inpatient Hospital Stay (HOSPITAL_COMMUNITY)
Admission: EM | Admit: 2024-06-18 | Discharge: 2024-06-28 | DRG: 682 | Disposition: A | Discharge status: Skilled Nursing Facility | Attending: Internal Medicine | Admitting: Internal Medicine

## 2024-06-18 DIAGNOSIS — Z952 Presence of prosthetic heart valve: Secondary | ICD-10-CM

## 2024-06-18 DIAGNOSIS — Z87891 Personal history of nicotine dependence: Secondary | ICD-10-CM

## 2024-06-18 DIAGNOSIS — J690 Pneumonitis due to inhalation of food and vomit: Secondary | ICD-10-CM | POA: Diagnosis present

## 2024-06-18 DIAGNOSIS — N400 Enlarged prostate without lower urinary tract symptoms: Secondary | ICD-10-CM | POA: Diagnosis present

## 2024-06-18 DIAGNOSIS — K5792 Diverticulitis of intestine, part unspecified, without perforation or abscess without bleeding: Secondary | ICD-10-CM | POA: Diagnosis present

## 2024-06-18 DIAGNOSIS — N179 Acute kidney failure, unspecified: Principal | ICD-10-CM | POA: Diagnosis present

## 2024-06-18 DIAGNOSIS — Z953 Presence of xenogenic heart valve: Secondary | ICD-10-CM

## 2024-06-18 DIAGNOSIS — Z961 Presence of intraocular lens: Secondary | ICD-10-CM | POA: Diagnosis present

## 2024-06-18 DIAGNOSIS — Z6833 Body mass index (BMI) 33.0-33.9, adult: Secondary | ICD-10-CM

## 2024-06-18 DIAGNOSIS — F03911 Unspecified dementia, unspecified severity, with agitation: Secondary | ICD-10-CM | POA: Diagnosis present

## 2024-06-18 DIAGNOSIS — I10 Essential (primary) hypertension: Secondary | ICD-10-CM | POA: Diagnosis present

## 2024-06-18 DIAGNOSIS — E876 Hypokalemia: Secondary | ICD-10-CM | POA: Diagnosis present

## 2024-06-18 DIAGNOSIS — E1165 Type 2 diabetes mellitus with hyperglycemia: Secondary | ICD-10-CM | POA: Diagnosis present

## 2024-06-18 DIAGNOSIS — Z7901 Long term (current) use of anticoagulants: Secondary | ICD-10-CM

## 2024-06-18 DIAGNOSIS — Z7189 Other specified counseling: Secondary | ICD-10-CM

## 2024-06-18 DIAGNOSIS — K5732 Diverticulitis of large intestine without perforation or abscess without bleeding: Secondary | ICD-10-CM | POA: Diagnosis present

## 2024-06-18 DIAGNOSIS — E785 Hyperlipidemia, unspecified: Secondary | ICD-10-CM | POA: Diagnosis present

## 2024-06-18 DIAGNOSIS — S8001XA Contusion of right knee, initial encounter: Secondary | ICD-10-CM | POA: Diagnosis present

## 2024-06-18 DIAGNOSIS — F32A Depression, unspecified: Secondary | ICD-10-CM | POA: Diagnosis present

## 2024-06-18 DIAGNOSIS — E669 Obesity, unspecified: Secondary | ICD-10-CM | POA: Diagnosis present

## 2024-06-18 DIAGNOSIS — W19XXXA Unspecified fall, initial encounter: Secondary | ICD-10-CM | POA: Diagnosis present

## 2024-06-18 DIAGNOSIS — F0390 Unspecified dementia without behavioral disturbance: Secondary | ICD-10-CM | POA: Diagnosis present

## 2024-06-18 DIAGNOSIS — H918X3 Other specified hearing loss, bilateral: Secondary | ICD-10-CM | POA: Diagnosis present

## 2024-06-18 DIAGNOSIS — Z79899 Other long term (current) drug therapy: Secondary | ICD-10-CM

## 2024-06-18 DIAGNOSIS — Z888 Allergy status to other drugs, medicaments and biological substances status: Secondary | ICD-10-CM

## 2024-06-18 DIAGNOSIS — E860 Dehydration: Principal | ICD-10-CM | POA: Diagnosis present

## 2024-06-18 DIAGNOSIS — S82852A Displaced trimalleolar fracture of left lower leg, initial encounter for closed fracture: Secondary | ICD-10-CM | POA: Diagnosis present

## 2024-06-18 DIAGNOSIS — Z833 Family history of diabetes mellitus: Secondary | ICD-10-CM

## 2024-06-18 DIAGNOSIS — Z823 Family history of stroke: Secondary | ICD-10-CM

## 2024-06-18 DIAGNOSIS — Z7409 Other reduced mobility: Secondary | ICD-10-CM | POA: Diagnosis present

## 2024-06-18 DIAGNOSIS — Z8673 Personal history of transient ischemic attack (TIA), and cerebral infarction without residual deficits: Secondary | ICD-10-CM

## 2024-06-18 DIAGNOSIS — M069 Rheumatoid arthritis, unspecified: Secondary | ICD-10-CM | POA: Diagnosis present

## 2024-06-18 DIAGNOSIS — T8209XA Other mechanical complication of heart valve prosthesis, initial encounter: Secondary | ICD-10-CM

## 2024-06-18 DIAGNOSIS — L4050 Arthropathic psoriasis, unspecified: Secondary | ICD-10-CM | POA: Diagnosis present

## 2024-06-18 DIAGNOSIS — Z515 Encounter for palliative care: Secondary | ICD-10-CM

## 2024-06-18 DIAGNOSIS — E875 Hyperkalemia: Secondary | ICD-10-CM | POA: Diagnosis present

## 2024-06-18 DIAGNOSIS — S82892A Other fracture of left lower leg, initial encounter for closed fracture: Secondary | ICD-10-CM | POA: Insufficient documentation

## 2024-06-18 DIAGNOSIS — R531 Weakness: Secondary | ICD-10-CM

## 2024-06-18 DIAGNOSIS — Z603 Acculturation difficulty: Secondary | ICD-10-CM | POA: Diagnosis present

## 2024-06-18 DIAGNOSIS — S82855A Nondisplaced trimalleolar fracture of left lower leg, initial encounter for closed fracture: Secondary | ICD-10-CM

## 2024-06-18 DIAGNOSIS — Z9049 Acquired absence of other specified parts of digestive tract: Secondary | ICD-10-CM

## 2024-06-18 DIAGNOSIS — Z66 Do not resuscitate: Secondary | ICD-10-CM | POA: Diagnosis present

## 2024-06-18 DIAGNOSIS — K7460 Unspecified cirrhosis of liver: Secondary | ICD-10-CM | POA: Diagnosis present

## 2024-06-18 LAB — URINALYSIS, W/ REFLEX TO CULTURE (INFECTION SUSPECTED)
Bacteria, UA: NONE SEEN
Bilirubin Urine: NEGATIVE
Glucose, UA: NEGATIVE mg/dL
Ketones, ur: NEGATIVE mg/dL
Leukocytes,Ua: NEGATIVE
Nitrite: NEGATIVE
Protein, ur: 30 mg/dL — AB
Specific Gravity, Urine: >1.046 — ABNORMAL HIGH (ref 1.005–1.030)
pH: 5 (ref 5.0–8.0)

## 2024-06-18 LAB — COMPREHENSIVE METABOLIC PANEL WITH GFR
ALT: 38 U/L (ref 0–44)
AST: 51 U/L — ABNORMAL HIGH (ref 15–41)
Albumin: 4 g/dL (ref 3.5–5.0)
Alkaline Phosphatase: 74 U/L (ref 38–126)
Anion gap: 12 (ref 5–15)
BUN: 16 mg/dL (ref 8–23)
CO2: 23 mmol/L (ref 22–32)
Calcium: 9.2 mg/dL (ref 8.9–10.3)
Chloride: 102 mmol/L (ref 98–111)
Creatinine, Ser: 1.48 mg/dL — ABNORMAL HIGH (ref 0.61–1.24)
GFR, Estimated: 44 mL/min — ABNORMAL LOW
Glucose, Bld: 180 mg/dL — ABNORMAL HIGH (ref 70–99)
Potassium: 5.2 mmol/L — ABNORMAL HIGH (ref 3.5–5.1)
Sodium: 138 mmol/L (ref 135–145)
Total Bilirubin: 1.2 mg/dL (ref 0.0–1.2)
Total Protein: 7.4 g/dL (ref 6.5–8.1)

## 2024-06-18 LAB — LIPASE, BLOOD: Lipase: 40 U/L (ref 11–51)

## 2024-06-18 LAB — CBC WITH DIFFERENTIAL/PLATELET
Abs Immature Granulocytes: 0.05 K/uL (ref 0.00–0.07)
Basophils Absolute: 0 K/uL (ref 0.0–0.1)
Basophils Relative: 0 %
Eosinophils Absolute: 0.1 K/uL (ref 0.0–0.5)
Eosinophils Relative: 1 %
HCT: 41.7 % (ref 39.0–52.0)
Hemoglobin: 13.1 g/dL (ref 13.0–17.0)
Immature Granulocytes: 0 %
Lymphocytes Relative: 8 %
Lymphs Abs: 1 K/uL (ref 0.7–4.0)
MCH: 28.2 pg (ref 26.0–34.0)
MCHC: 31.4 g/dL (ref 30.0–36.0)
MCV: 89.9 fL (ref 80.0–100.0)
Monocytes Absolute: 0.7 K/uL (ref 0.1–1.0)
Monocytes Relative: 5 %
Neutro Abs: 11.2 K/uL — ABNORMAL HIGH (ref 1.7–7.7)
Neutrophils Relative %: 86 %
Platelets: 151 K/uL (ref 150–400)
RBC: 4.64 MIL/uL (ref 4.22–5.81)
RDW: 12 % (ref 11.5–15.5)
WBC: 13 K/uL — ABNORMAL HIGH (ref 4.0–10.5)
nRBC: 0 % (ref 0.0–0.2)

## 2024-06-18 MED ORDER — LACTATED RINGERS IV BOLUS
500.0000 mL | Freq: Once | INTRAVENOUS | Status: AC
  Administered 2024-06-18: 500 mL via INTRAVENOUS

## 2024-06-18 MED ORDER — IOHEXOL 300 MG/ML  SOLN
80.0000 mL | Freq: Once | INTRAMUSCULAR | Status: AC | PRN
  Administered 2024-06-18: 80 mL via INTRAVENOUS

## 2024-06-18 MED ORDER — ACETAMINOPHEN 500 MG PO TABS
1000.0000 mg | ORAL_TABLET | Freq: Once | ORAL | Status: AC
  Administered 2024-06-18: 1000 mg via ORAL
  Filled 2024-06-18: qty 2

## 2024-06-18 MED ORDER — SODIUM CHLORIDE 0.9 % IV SOLN
2.0000 g | Freq: Once | INTRAVENOUS | Status: AC
  Administered 2024-06-18: 2 g via INTRAVENOUS
  Filled 2024-06-18: qty 20

## 2024-06-18 MED ORDER — METRONIDAZOLE 500 MG/100ML IV SOLN
500.0000 mg | Freq: Once | INTRAVENOUS | Status: AC
  Administered 2024-06-18: 500 mg via INTRAVENOUS
  Filled 2024-06-18: qty 100

## 2024-06-18 MED ORDER — LACTATED RINGERS IV SOLN: INTRAVENOUS | Status: DC

## 2024-06-18 NOTE — ED Triage Notes (Signed)
 Pt BIB EMS due to weakness, decreased in take and diarrhea over past 16hrs according to family - lives with daughter. A/o to baseline - speaks Russian.   Orthostatic hypotension present with EMS.  CBG 204

## 2024-06-18 NOTE — ED Provider Notes (Signed)
 " [place] EMERGENCY DEPARTMENT AT [facility] Provider Note   CSN: [id number] Arrival date & time: 06/18/24  1515     Patient presents with: Weakness and Diarrhea   [name] is a 89 y.o. male.   Pt is a 89y/o male with hx of psoriasis with psoriatic arthritis on methotrexate , hypertension and bioprosthetic valve dysfunction status post TAVR on Eliquis  who who is presenting today with his daughter due to diarrhea today and weakness at home.  His daughter reports that yesterday was a normal day and he felt fine.  However after he got up today he had several episodes of diarrhea and after the last episode he was unable to walk.  His daughter reports that he just feels like he cannot hold up his weight.  He does not have any abdominal pain but was complaining of some nausea to his daughter even though he denies it now.  He has not had any vomiting today and there was no fever.  She denies him having any antibiotics in the last few months.  He has not been around anybody who is ill and he has not had any cough or cold symptoms.  She denies him being confused or altered today.  There is been no recent medication changes.  He does live alone but she is not aware of any falls but today when he became weak he did go down on his right knee and he has been complaining of knee pain.  The history is provided by the patient. The history is limited by a language barrier. A language interpreter was used.  Weakness Associated symptoms: diarrhea   Diarrhea      Prior to Admission medications  Medication Sig Start Date End Date Taking? Authorizing Provider  apixaban  (ELIQUIS ) 5 MG TABS tablet Take 1 tablet (5 mg total) by mouth 2 (two) times daily. 02/07/24   Jeffrie Oneil BROCKS, MD  clobetasol  cream (TEMOVATE ) 0.05 % Apply 1 application. topically 2 (two) times daily. Patient taking differently: Apply 1 application  topically as needed. 09/29/21   Livingston Rigg, MD  dutasteride  (AVODART ) 0.5 MG  capsule Take 0.5 mg by mouth daily.    [provider]  escitalopram  (LEXAPRO ) 10 MG tablet Take 10 mg by mouth daily. 01/10/24   [provider]  furosemide  (LASIX ) 20 MG tablet TAKE 1 TABLET(20 MG) BY MOUTH DAILY AS NEEDED 09/12/23   Verlin Lonni BIRCH, MD  losartan  (COZAAR ) 50 MG tablet Take 1 tablet (50 mg total) by mouth daily. 02/07/24 05/07/24  Jeffrie Oneil BROCKS, MD  Melatonin 5 MG TABS Take 10-15 mg by mouth as needed (for sleep).    [provider]  Multiple Vitamin (MULTIVITAMIN WITH MINERALS) TABS tablet Take 1 tablet by mouth daily. 12/26/21   Cheryle Page, MD  pantoprazole  (PROTONIX ) 40 MG tablet Take 1 tablet (40 mg total) by mouth daily. 09/27/18   Vann, Jessica U, DO  polyethylene glycol (MIRALAX ) 17 g packet Take 17 g by mouth daily as needed for moderate constipation. Patient taking differently: Take 17 g by mouth as needed for moderate constipation. 12/25/21   Cheryle Page, MD  potassium chloride  (KLOR-CON ) 10 MEQ tablet Take 1 tablet (10 mEq total) by mouth daily. 09/12/23   Verlin Lonni BIRCH, MD  senna-docusate (SENOKOT-S) 8.6-50 MG tablet Take 1 tablet by mouth 2 (two) times daily. Patient taking differently: Take 1 tablet by mouth as needed. 12/25/21   Cheryle Page, MD  SKYRIZI  PEN 150 MG/ML SOAJ INJECT  150MG  SUBCUTANEOUSLY EVERY 12 WEEKS AS DIRECTED. 02/02/22   Livingston Rigg, MD    Allergies: Ativan  [lorazepam ] and Phenergan  [promethazine  hcl]    Review of Systems  Gastrointestinal:  Positive for diarrhea.  Neurological:  Positive for weakness.    Updated Vital Signs BP (!) 127/47 (BP Location: Left Arm)   Pulse 61   Temp 98 F (36.7 C) (Oral)   Resp 15   SpO2 94%   Physical Exam Vitals and nursing note reviewed.  Constitutional:      General: He is not in acute distress.    Appearance: He is well-developed.  HENT:     Head: Normocephalic and atraumatic.  Eyes:     Conjunctiva/sclera: Conjunctivae normal.     Pupils: Pupils  are equal, round, and reactive to light.  Cardiovascular:     Rate and Rhythm: Normal rate and regular rhythm.     Heart sounds: Murmur heard.  Pulmonary:     Effort: Pulmonary effort is normal. No respiratory distress.     Breath sounds: Normal breath sounds. No wheezing or rales.  Abdominal:     General: There is no distension.     Palpations: Abdomen is soft.     Tenderness: There is no abdominal tenderness. There is no guarding or rebound.     Comments: Slight guarding but patient reports no pain.  No obvious hernias  Musculoskeletal:        General: Tenderness present. Normal range of motion.     Cervical back: Normal range of motion and neck supple.     Right knee: Normal range of motion. Tenderness present.     Right lower leg: No edema.     Left lower leg: No edema.       Legs:  Skin:    General: Skin is warm and dry.     Findings: Rash present. No erythema.     Comments: Psoriasis noted over the lower extremities most notable on the left foot and ankle  Neurological:     Mental Status: He is alert and oriented to person, place, and time. Mental status is at baseline.     Comments: 5 out of 5 strength in the bilateral lower extremities and upper extremities  Psychiatric:        Behavior: Behavior normal.     (all labs ordered are listed, but only abnormal results are displayed) Labs Reviewed  CBC WITH DIFFERENTIAL/PLATELET - Abnormal; Notable for the following components:      Result Value   WBC 13.0 (*)    Neutro Abs 11.2 (*)    All other components within normal limits  COMPREHENSIVE METABOLIC PANEL WITH GFR - Abnormal; Notable for the following components:   Potassium 5.2 (*)    Glucose, Bld 180 (*)    Creatinine, Ser 1.48 (*)    AST 51 (*)    GFR, Estimated 44 (*)    All other components within normal limits  URINALYSIS, W/ REFLEX TO CULTURE (INFECTION SUSPECTED) - Abnormal; Notable for the following components:   APPearance HAZY (*)    Specific Gravity,  Urine >1.046 (*)    Hgb urine dipstick SMALL (*)    Protein, ur 30 (*)    All other components within normal limits  LIPASE, BLOOD    EKG: EKG Interpretation Date/Time:  Tuesday June 18 2024 16:28:29 EST Ventricular Rate:  60 PR Interval:  191 QRS Duration:  92 QT Interval:  434 QTC Calculation: 434 R Axis:   -29  Text Interpretation: Sinus rhythm Atrial premature complex Borderline left axis deviation Borderline low voltage, extremity leads No significant change since last tracing Confirmed by Doretha Folks ([id number]) on 06/18/2024 4:34:09 PM  Radiology: CT ABDOMEN PELVIS W CONTRAST Result Date: 06/18/2024 EXAM: CT ABDOMEN AND PELVIS WITH CONTRAST 06/18/2024 07:07:45 PM TECHNIQUE: CT of the abdomen and pelvis was performed with the administration of 80 mL of iohexol  (OMNIPAQUE ) 300 MG/ML solution. Multiplanar reformatted images are provided for review. Automated exposure control, iterative reconstruction, and/or weight-based adjustment of the mA/kV was utilized to reduce the radiation dose to as low as reasonably achievable. COMPARISON: 12/24/2021 CLINICAL HISTORY: Abdominal pain, acute, nonlocalized. Weakness, decreased appetite, and diarrhea over the past 16 hours. FINDINGS: LOWER CHEST: Cardiac valve prosthesis. Motion artifact in the lung bases. No focal consolidation. Right lower lung nodule centrally measuring 6 mm in diameter. No change since prior study. LIVER: Diffuse fatty infiltration of the liver. Scattered calcifications in the liver, likely granulomas. GALLBLADDER AND BILE DUCTS: Gallbladder surgically absent. Pneumobilia is likely postoperative. No biliary ductal dilatation. SPLEEN: Spleen is normal. PANCREAS: Pancreas is normal. ADRENAL GLANDS: Adrenal glands are normal. KIDNEYS, URETERS AND BLADDER: Multiple bilateral renal cysts, largest on the right measuring 3.5 cm in diameter. No change since prior study. No imaging follow-up is indicated. No stones in the kidneys or  ureters. No hydronephrosis or hydroureter. No perinephric or periureteral stranding. The bladder is normal. GI AND BOWEL: Stomach, small bowel, and colon are not abnormally distended. No wall thickening or inflammatory stranding. Diverticulosis of the sigmoid colon. Minimal pericolonic infiltration may represent early changes of acute diverticulitis. The appendix is normal. There is no bowel obstruction. PERITONEUM AND RETROPERITONEUM: No ascites. No free air. VASCULATURE: Aorta is normal in caliber. Calcification of the aorta. No aneurysm. LYMPH NODES: No lymphadenopathy. REPRODUCTIVE ORGANS: The prostate gland is not enlarged. BONES AND SOFT TISSUES: Degenerative changes in the spine. No acute osseous abnormality. No focal soft tissue abnormality. IMPRESSION: 1. Diverticulosis of the sigmoid colon with minimal pericolonic infiltration, possibly representing early changes of acute diverticulitis. No abscess. Electronically signed by: Elsie Gravely MD 06/18/2024 07:29 PM EST RP Workstation: [id number]   DG Knee Complete 4 Views Right Result Date: 06/18/2024 EXAM: 4 VIEW(S) XRAY OF THE KNEE 06/18/2024 04:46:00 PM COMPARISON: None available. CLINICAL HISTORY: pain after fall FINDINGS: BONES AND JOINTS: No acute fracture. No malalignment. No significant joint effusion. Minimal tricompartmental osteophyte formation consistent with osteoarthritis. SOFT TISSUES: Vascular calcifications are present. The remaining visualized soft tissues are unremarkable. IMPRESSION: 1. No acute fracture or dislocation. 2. Minimal tricompartmental osteoarthritis. Electronically signed by: Greig Pique MD 06/18/2024 05:17 PM EST RP Workstation: [id number]     Procedures   Medications Ordered in the ED  cefTRIAXone  (ROCEPHIN ) 2 g in sodium chloride  0.9 % 100 mL IVPB (has no administration in time range)    And  metroNIDAZOLE  (FLAGYL ) IVPB 500 mg (has no administration in time range)  lactated ringers  infusion (has no  administration in time range)  lactated ringers  bolus 500 mL (0 mLs Intravenous Stopped 06/18/24 2106)  acetaminophen  (TYLENOL ) tablet 1,000 mg (1,000 mg Oral Given 06/18/24 1608)  iohexol  (OMNIPAQUE ) 300 MG/ML solution 80 mL (80 mLs Intravenous Contrast Given 06/18/24 1854)                                    Medical Decision Making Amount and/or Complexity of Data Reviewed Labs: ordered. Decision-making details documented in  ED Course. Radiology: ordered and independent interpretation performed. Decision-making details documented in ED Course. ECG/medicine tests: ordered and independent interpretation performed. Decision-making details documented in ED Course.  Risk OTC drugs. Prescription drug management.   Pt with multiple medical problems and comorbidities and presenting today with a complaint that caries a high risk for morbidity and mortality.  Here today after multiple episodes of diarrhea today and generalized weakness.  No delirium or altered mental status per his daughter.  Patient is awake and alert here and able to follow commands and answers his daughter appropriately.  He has some minimal guarding but reports no pain in his abdomen.  No blood in his stool that was noted by the patient or his daughter.  He does take Eliquis .  Concern for dehydration, electrolyte abnormality, possible diverticulitis.  Lower suspicion for acute cardiac cause and no symptoms to suggest stroke. I independently interpreted patient's labs and EKG.  EKG did not show any acute findings, CBC with leukocytosis of 13, CMP with mild AKI with creatinine of 1.48 from 1.2 and stable blood sugar, UA without acute findings.  I have independently visualized and interpreted pt's images today.  CT without evidence of bowel obstruction or appendicitis.  Radiology reports diverticulosis with minimal PERI colonic infiltration representing early changes of acute diverticulitis.  Attempted to ambulate the patient and he is able  to get up and walk but continues to complain that his ankles feel weak and he does not feel steady.  He does live at home alone.  Will start antibiotics and admit for diverticulitis and weakness with mild AKI.  Consulted hospitalist for admit.       Final diagnoses:  Dehydration  Diverticulitis  AKI (acute kidney injury)    ED Discharge Orders     None          Doretha Folks, MD 06/18/24 2107  "

## 2024-06-19 ENCOUNTER — Encounter (HOSPITAL_COMMUNITY): Payer: Self-pay | Admitting: Internal Medicine

## 2024-06-19 ENCOUNTER — Other Ambulatory Visit: Payer: Self-pay

## 2024-06-19 ENCOUNTER — Observation Stay (HOSPITAL_COMMUNITY)

## 2024-06-19 DIAGNOSIS — K5792 Diverticulitis of intestine, part unspecified, without perforation or abscess without bleeding: Secondary | ICD-10-CM

## 2024-06-19 DIAGNOSIS — E875 Hyperkalemia: Secondary | ICD-10-CM | POA: Diagnosis present

## 2024-06-19 DIAGNOSIS — W19XXXA Unspecified fall, initial encounter: Secondary | ICD-10-CM | POA: Diagnosis present

## 2024-06-19 DIAGNOSIS — L4050 Arthropathic psoriasis, unspecified: Secondary | ICD-10-CM | POA: Diagnosis present

## 2024-06-19 DIAGNOSIS — F32A Depression, unspecified: Secondary | ICD-10-CM | POA: Diagnosis present

## 2024-06-19 DIAGNOSIS — Z6833 Body mass index (BMI) 33.0-33.9, adult: Secondary | ICD-10-CM | POA: Diagnosis not present

## 2024-06-19 DIAGNOSIS — K5732 Diverticulitis of large intestine without perforation or abscess without bleeding: Secondary | ICD-10-CM | POA: Diagnosis present

## 2024-06-19 DIAGNOSIS — Z953 Presence of xenogenic heart valve: Secondary | ICD-10-CM | POA: Diagnosis not present

## 2024-06-19 DIAGNOSIS — S82892A Other fracture of left lower leg, initial encounter for closed fracture: Secondary | ICD-10-CM

## 2024-06-19 DIAGNOSIS — Z7409 Other reduced mobility: Secondary | ICD-10-CM | POA: Diagnosis present

## 2024-06-19 DIAGNOSIS — M069 Rheumatoid arthritis, unspecified: Secondary | ICD-10-CM | POA: Diagnosis present

## 2024-06-19 DIAGNOSIS — Z515 Encounter for palliative care: Secondary | ICD-10-CM | POA: Diagnosis not present

## 2024-06-19 DIAGNOSIS — R531 Weakness: Secondary | ICD-10-CM | POA: Diagnosis not present

## 2024-06-19 DIAGNOSIS — S82852A Displaced trimalleolar fracture of left lower leg, initial encounter for closed fracture: Secondary | ICD-10-CM | POA: Diagnosis present

## 2024-06-19 DIAGNOSIS — N179 Acute kidney failure, unspecified: Secondary | ICD-10-CM

## 2024-06-19 DIAGNOSIS — Z603 Acculturation difficulty: Secondary | ICD-10-CM | POA: Diagnosis present

## 2024-06-19 DIAGNOSIS — Z8673 Personal history of transient ischemic attack (TIA), and cerebral infarction without residual deficits: Secondary | ICD-10-CM

## 2024-06-19 DIAGNOSIS — S82855A Nondisplaced trimalleolar fracture of left lower leg, initial encounter for closed fracture: Secondary | ICD-10-CM | POA: Diagnosis not present

## 2024-06-19 DIAGNOSIS — Z7901 Long term (current) use of anticoagulants: Secondary | ICD-10-CM | POA: Diagnosis not present

## 2024-06-19 DIAGNOSIS — Z7189 Other specified counseling: Secondary | ICD-10-CM | POA: Diagnosis not present

## 2024-06-19 DIAGNOSIS — E860 Dehydration: Secondary | ICD-10-CM | POA: Diagnosis present

## 2024-06-19 DIAGNOSIS — Z87891 Personal history of nicotine dependence: Secondary | ICD-10-CM | POA: Diagnosis not present

## 2024-06-19 DIAGNOSIS — F03911 Unspecified dementia, unspecified severity, with agitation: Secondary | ICD-10-CM | POA: Diagnosis present

## 2024-06-19 DIAGNOSIS — K7460 Unspecified cirrhosis of liver: Secondary | ICD-10-CM | POA: Diagnosis present

## 2024-06-19 DIAGNOSIS — I10 Essential (primary) hypertension: Secondary | ICD-10-CM | POA: Diagnosis present

## 2024-06-19 DIAGNOSIS — E1165 Type 2 diabetes mellitus with hyperglycemia: Secondary | ICD-10-CM | POA: Diagnosis present

## 2024-06-19 DIAGNOSIS — N400 Enlarged prostate without lower urinary tract symptoms: Secondary | ICD-10-CM | POA: Diagnosis present

## 2024-06-19 DIAGNOSIS — Z66 Do not resuscitate: Secondary | ICD-10-CM | POA: Diagnosis present

## 2024-06-19 DIAGNOSIS — Z79899 Other long term (current) drug therapy: Secondary | ICD-10-CM | POA: Diagnosis not present

## 2024-06-19 DIAGNOSIS — E785 Hyperlipidemia, unspecified: Secondary | ICD-10-CM | POA: Diagnosis present

## 2024-06-19 DIAGNOSIS — E876 Hypokalemia: Secondary | ICD-10-CM | POA: Diagnosis present

## 2024-06-19 DIAGNOSIS — J690 Pneumonitis due to inhalation of food and vomit: Secondary | ICD-10-CM | POA: Diagnosis present

## 2024-06-19 LAB — COMPREHENSIVE METABOLIC PANEL WITH GFR
ALT: 38 U/L (ref 0–44)
AST: 60 U/L — ABNORMAL HIGH (ref 15–41)
Albumin: 3.9 g/dL (ref 3.5–5.0)
Alkaline Phosphatase: 76 U/L (ref 38–126)
Anion gap: 11 (ref 5–15)
BUN: 19 mg/dL (ref 8–23)
CO2: 24 mmol/L (ref 22–32)
Calcium: 8.9 mg/dL (ref 8.9–10.3)
Chloride: 105 mmol/L (ref 98–111)
Creatinine, Ser: 1.46 mg/dL — ABNORMAL HIGH (ref 0.61–1.24)
GFR, Estimated: 44 mL/min — ABNORMAL LOW
Glucose, Bld: 167 mg/dL — ABNORMAL HIGH (ref 70–99)
Potassium: 4.2 mmol/L (ref 3.5–5.1)
Sodium: 140 mmol/L (ref 135–145)
Total Bilirubin: 1 mg/dL (ref 0.0–1.2)
Total Protein: 7 g/dL (ref 6.5–8.1)

## 2024-06-19 LAB — GLUCOSE, CAPILLARY
Glucose-Capillary: 184 mg/dL — ABNORMAL HIGH (ref 70–99)
Glucose-Capillary: 117 mg/dL — ABNORMAL HIGH (ref 70–99)
Glucose-Capillary: 172 mg/dL — ABNORMAL HIGH (ref 70–99)

## 2024-06-19 LAB — CBC
HCT: 40.4 % (ref 39.0–52.0)
Hemoglobin: 13 g/dL (ref 13.0–17.0)
MCH: 28.5 pg (ref 26.0–34.0)
MCHC: 32.2 g/dL (ref 30.0–36.0)
MCV: 88.6 fL (ref 80.0–100.0)
Platelets: 154 K/uL (ref 150–400)
RBC: 4.56 MIL/uL (ref 4.22–5.81)
RDW: 11.9 % (ref 11.5–15.5)
WBC: 8.8 K/uL (ref 4.0–10.5)
nRBC: 0 % (ref 0.0–0.2)

## 2024-06-19 LAB — HEMOGLOBIN A1C
Hgb A1c MFr Bld: 7 % — ABNORMAL HIGH (ref 4.8–5.6)
Mean Plasma Glucose: 154.2 mg/dL

## 2024-06-19 LAB — CBG MONITORING, ED: Glucose-Capillary: 181 mg/dL — ABNORMAL HIGH (ref 70–99)

## 2024-06-19 MED ORDER — METRONIDAZOLE 500 MG/100ML IV SOLN
500.0000 mg | Freq: Two times a day (BID) | INTRAVENOUS | Status: DC
  Administered 2024-06-19 – 2024-06-20 (×4): 500 mg via INTRAVENOUS
  Filled 2024-06-19 (×4): qty 100

## 2024-06-19 MED ORDER — DUTASTERIDE 0.5 MG PO CAPS
0.5000 mg | ORAL_CAPSULE | Freq: Every day | ORAL | Status: DC
  Administered 2024-06-19 – 2024-06-28 (×10): 0.5 mg via ORAL
  Filled 2024-06-19 (×11): qty 1

## 2024-06-19 MED ORDER — SODIUM CHLORIDE 0.9 % IV SOLN
2.0000 g | INTRAVENOUS | Status: DC
  Administered 2024-06-19 – 2024-06-23 (×5): 2 g via INTRAVENOUS
  Filled 2024-06-19 (×6): qty 20

## 2024-06-19 MED ORDER — LACTATED RINGERS IV SOLN: INTRAVENOUS | Status: DC

## 2024-06-19 MED ORDER — PANTOPRAZOLE SODIUM 40 MG PO TBEC
40.0000 mg | DELAYED_RELEASE_TABLET | Freq: Every day | ORAL | Status: DC
  Administered 2024-06-19 – 2024-06-28 (×10): 40 mg via ORAL
  Filled 2024-06-19 (×10): qty 1

## 2024-06-19 MED ORDER — OLANZAPINE 10 MG IM SOLR
2.5000 mg | Freq: Once | INTRAMUSCULAR | Status: AC | PRN
  Administered 2024-06-19: 2.5 mg via INTRAMUSCULAR
  Filled 2024-06-19 (×2): qty 10

## 2024-06-19 MED ORDER — ACETAMINOPHEN 325 MG PO TABS
650.0000 mg | ORAL_TABLET | Freq: Four times a day (QID) | ORAL | Status: DC | PRN
  Administered 2024-06-19 – 2024-06-21 (×4): 650 mg via ORAL
  Filled 2024-06-19 (×4): qty 2

## 2024-06-19 MED ORDER — APIXABAN 5 MG PO TABS
5.0000 mg | ORAL_TABLET | Freq: Two times a day (BID) | ORAL | Status: DC
  Administered 2024-06-19 – 2024-06-28 (×20): 5 mg via ORAL
  Filled 2024-06-19 (×20): qty 1

## 2024-06-19 MED ORDER — HALOPERIDOL LACTATE 5 MG/ML IJ SOLN: 5.0000 mg | Freq: Four times a day (QID) | INTRAMUSCULAR | Status: DC | PRN

## 2024-06-19 MED ORDER — ACETAMINOPHEN 650 MG RE SUPP: 650.0000 mg | Freq: Four times a day (QID) | RECTAL | Status: DC | PRN

## 2024-06-19 MED ORDER — HALOPERIDOL LACTATE 5 MG/ML IJ SOLN
5.0000 mg | Freq: Four times a day (QID) | INTRAMUSCULAR | Status: DC | PRN
  Administered 2024-06-19: 5 mg via INTRAMUSCULAR
  Filled 2024-06-19: qty 1

## 2024-06-19 MED ORDER — INSULIN ASPART 100 UNIT/ML IJ SOLN: 0.0000 [IU] | Freq: Every day | INTRAMUSCULAR | Status: DC

## 2024-06-19 MED ORDER — ESCITALOPRAM OXALATE 10 MG PO TABS
10.0000 mg | ORAL_TABLET | Freq: Every day | ORAL | Status: DC
  Administered 2024-06-19 – 2024-06-21 (×4): 10 mg via ORAL
  Filled 2024-06-19 (×4): qty 1

## 2024-06-19 MED ORDER — MELATONIN 5 MG PO TABS
10.0000 mg | ORAL_TABLET | ORAL | Status: DC | PRN
  Administered 2024-06-19 – 2024-06-22 (×2): 15 mg via ORAL
  Filled 2024-06-19 (×3): qty 3

## 2024-06-19 MED ORDER — HYDRALAZINE HCL 25 MG PO TABS
25.0000 mg | ORAL_TABLET | Freq: Three times a day (TID) | ORAL | Status: DC
  Administered 2024-06-19 – 2024-06-20 (×4): 25 mg via ORAL
  Filled 2024-06-19 (×4): qty 1

## 2024-06-19 MED ORDER — INSULIN ASPART 100 UNIT/ML IJ SOLN
0.0000 [IU] | Freq: Three times a day (TID) | INTRAMUSCULAR | Status: DC
  Administered 2024-06-19 – 2024-06-20 (×2): 2 [IU] via SUBCUTANEOUS
  Administered 2024-06-21: 3 [IU] via SUBCUTANEOUS
  Administered 2024-06-21 – 2024-06-22 (×3): 1 [IU] via SUBCUTANEOUS
  Administered 2024-06-22: 2 [IU] via SUBCUTANEOUS
  Administered 2024-06-23 (×2): 1 [IU] via SUBCUTANEOUS
  Administered 2024-06-23: 2 [IU] via SUBCUTANEOUS
  Administered 2024-06-25: 1 [IU] via SUBCUTANEOUS
  Administered 2024-06-25: 2 [IU] via SUBCUTANEOUS
  Administered 2024-06-25: 1 [IU] via SUBCUTANEOUS
  Administered 2024-06-26: 2 [IU] via SUBCUTANEOUS
  Administered 2024-06-27 (×2): 1 [IU] via SUBCUTANEOUS
  Administered 2024-06-28: 2 [IU] via SUBCUTANEOUS
  Filled 2024-06-19 (×2): qty 2
  Filled 2024-06-19: qty 1
  Filled 2024-06-19: qty 2
  Filled 2024-06-19 (×2): qty 1
  Filled 2024-06-19: qty 2
  Filled 2024-06-19: qty 1
  Filled 2024-06-19: qty 3
  Filled 2024-06-19: qty 1
  Filled 2024-06-19: qty 2
  Filled 2024-06-19 (×2): qty 1
  Filled 2024-06-19: qty 2
  Filled 2024-06-19 (×2): qty 1
  Filled 2024-06-19: qty 2

## 2024-06-19 NOTE — Progress Notes (Signed)
 Patient ID: [name], male   DOB: April 11, 1928, 89 y.o.   MRN: [id number] I was called about this patient this morning.  He is a 89 year old gentleman who presented to the emergency room yesterday with other medical issues.  He had had a mechanical fall and in the emergency room continue to complain of left ankle pain and swelling with difficulty with mobility.  A CT scan was obtained of the left ankle showing a trimalleolar ankle fracture.  I did review this imaging and fortunately the ankle mortise is well aligned and the fracture is well aligned.  Given the patient's significant comorbidities, this should be treated nonoperative with a splint and nonweightbearing until further notice.  We will order a splint through the Ortho techs.  From a orthopedic standpoint, he can be up with PT and OT with nonweightbearing on his left ankle.  We will need to see him in the office in 1 week for follow-up.

## 2024-06-19 NOTE — Progress Notes (Signed)
 PT Cancellation Note  Patient Details Name: [name] MRN: [id number] DOB: July 24, 1927   Cancelled Treatment:    Reason Eval/Treat Not Completed: Medical issues which prohibited therapy, orthopedic consult pending for Left ankle fracture, to order splints.  PT will need Weight bearing  clarification orders. PT will follow.  Darice Potters PT Acute Rehabilitation Services Office [phone]    Potters [name] 06/19/2024, 7:25 AM

## 2024-06-19 NOTE — H&P (Addendum)
 " History and Physical    [name] FMW:[id number] DOB: 09/01/1927 DOA: 06/18/2024  Patient coming from: Home.   Russian nurse, learning disability used.  History also provided by patient's daughter.  Chief Complaint: Weakness.  Diarrhea.  Fall.  HPI: [name] is a 89 y.o. male with history of bioprosthetic aortic valve replacement status post TAVR, history of CVA on Eliquis , hypertension, BPH, rheumatoid arthritis, psoriasis on Cosentyx, liver cirrhosis was brought to the ER after patient was found to be weak and had difficulty walking.  Per daughter patient has had some diarrhea and was walking to the bathroom when he had a fall following which he was finding difficult to ambulate.  Since patient had persistent symptoms he was brought to the ER.  ED Course: In the ER patient's labs show creatinine of 1.4 which increased from normal in 2023.  WBC 13 CT scan of the abdomen pelvis shows possible developing sigmoid diverticulitis.  Exam patient also has bruising on his knees x-rays of the right knee did not show any fracture.  Left ankle area looked inflamed and tender and CT scan shows acute fracture.  EKG shows normal sinus rhythm patient was started on fluids antibiotics for acute renal failure and diverticulitis.  Admitted for further observation.  Review of Systems: As per HPI, rest all negative.   Past Medical History:  Diagnosis Date   BPH (benign prostatic hypertrophy)    Depression    Diverticulosis of colon    GERD (gastroesophageal reflux disease)    Gilbert's syndrome    History of kidney stones    HLD (hyperlipidemia)    HOH (hard of hearing)    refuses to wear his Hearing Aid   Hypertension    Inguinal hernia    right   Internal hemorrhoid    Normal coronary arteries    a. Cath 07/2010: normal coronaries. Felt to have noncardiac CP/SOB at that time possibly r/t anxiety.   RA (rheumatoid arthritis) (HCC)    bilateral hands/ wrist--  seronegative   S/P TAVR (transcatheter aortic  valve replacement) 03/27/2018   23 mm Edwards Sapien 3 transcatheter heart valve placed via percutaneous right transfemoral approach    Severe aortic stenosis    Thyroid  nodule    noted 11-2009   Wears dentures     Past Surgical History:  Procedure Laterality Date   CARDIAC CATHETERIZATION  07-19-2010  dr wilbert turner   normal coronary arteries,  ef 60%,  moderate aortic stenosis- gradient , normal right heart pressure   CARDIOVASCULAR STRESS TEST  05/ 2011   dr jeffrie   normal low risk perfusion study   CATARACT EXTRACTION W/ INTRAOCULAR LENS  IMPLANT, BILATERAL  2013   CATARACT EXTRACTION, BILATERAL     CHOLECYSTECTOMY N/A 10/11/2018   Procedure: LAPAROSCOPIC CHOLECYSTECTOMY WITH INTRAOPERATIVE CHOLANGIOGRAM;  Surgeon: Curvin Deward MOULD, MD;  Location: [facility] OR;  Service: General;  Laterality: N/A;   COLONOSCOPY  2010  approx   ERCP N/A 09/25/2018   Procedure: ENDOSCOPIC RETROGRADE CHOLANGIOPANCREATOGRAPHY (ERCP);  Surgeon: Rosalie Kitchens, MD;  Location: [facility] ENDOSCOPY;  Service: Endoscopy;  Laterality: N/A;   INGUINAL HERNIA REPAIR Right 08/23/2018   Procedure: OPEN RIGHT INGUINAL HERNIA REPAIR WITH MESH;  Surgeon: Eletha Boas, MD;  Location: [facility] ORS;  Service: General;  Laterality: Right;   INTRAOPERATIVE TRANSTHORACIC ECHOCARDIOGRAM N/A 03/27/2018   Procedure: INTRAOPERATIVE TRANSTHORACIC ECHOCARDIOGRAM;  Surgeon: Verlin Lonni BIRCH, MD;  Location: [facility] OR;  Service: Open Heart Surgery;  Laterality: N/A;   REMOVAL OF STONES  09/25/2018  Procedure: REMOVAL OF STONES;  Surgeon: Rosalie Kitchens, MD;  Location: [facility] ENDOSCOPY;  Service: Endoscopy;;   RIGHT/LEFT HEART CATH AND CORONARY ANGIOGRAPHY N/A 02/06/2018   Procedure: RIGHT/LEFT HEART CATH AND CORONARY ANGIOGRAPHY;  Surgeon: Verlin Lonni BIRCH, MD;  Location: [facility] INVASIVE CV LAB;  Service: Cardiovascular;  Laterality: N/A;   SPHINCTEROTOMY  09/25/2018   Procedure: SPHINCTEROTOMY;  Surgeon: Rosalie Kitchens, MD;  Location: [facility] ENDOSCOPY;   Service: Endoscopy;;   TEE WITHOUT CARDIOVERSION N/A 12/24/2021   Procedure: TRANSESOPHAGEAL ECHOCARDIOGRAM (TEE);  Surgeon: Alvan Ronal BRAVO, MD;  Location: [facility] ENDOSCOPY;  Service: Cardiovascular;  Laterality: N/A;   THYROID  LOBECTOMY Right 05/05/2015   Procedure: RIGHT THYROID  LOBECTOMY;  Surgeon: Krystal Spinner, MD;  Location: [facility] OR;  Service: General;  Laterality: Right;   TRANSANAL HEMORRHOIDAL DEARTERIALIZATION N/A 04/09/2014   Procedure: TRANSANAL HEMORRHOIDAL DEARTERIALIZATION OF INTERNAL HEMORROIDS;  Surgeon: Bernarda Ned, MD;  Location: [facility];  Service: General;  Laterality: N/A;   TRANSCATHETER AORTIC VALVE REPLACEMENT, TRANSFEMORAL  03/27/2018   TRANSCATHETER AORTIC VALVE REPLACEMENT, TRANSFEMORAL N/A 03/27/2018   Procedure: TRANSCATHETER AORTIC VALVE REPLACEMENT, TRANSFEMORAL. Edwards SAPIEN 3 Transcatheter Heart Valve 23mm.;  Surgeon: Verlin Lonni BIRCH, MD;  Location: [facility] OR;  Service: Open Heart Surgery;  Laterality: N/A;   TRANSTHORACIC ECHOCARDIOGRAM  11-26-2013   moderate focal basal and mild LVH/  ef 65-70%/  grade I diastolic dysfunction/ mild LAE/  moderate calcification with stenosis AV with mild regurg,  gradients 35 abd 58 mmHg /  mild TR     reports that he quit smoking about 43 years ago. His smoking use included cigarettes. He started smoking about 51 years ago. He has never used smokeless tobacco. He reports current alcohol use. He reports that he does not use drugs.  Allergies[1]  Family History  Problem Relation Age of Onset   Pulmonary embolism Mother 82       caused by complications of surgery   CVA Father    Diabetes Brother    Diabetes Brother    Breast cancer Sister     Prior to Admission medications  Medication Sig Start Date End Date Taking? Authorizing Provider  apixaban  (ELIQUIS ) 5 MG TABS tablet Take 1 tablet (5 mg total) by mouth 2 (two) times daily. 02/07/24   Jeffrie Oneil BROCKS, MD  clobetasol  cream (TEMOVATE ) 0.05 % Apply 1  application. topically 2 (two) times daily. Patient taking differently: Apply 1 application  topically as needed. 09/29/21   Livingston Rigg, MD  dutasteride  (AVODART ) 0.5 MG capsule Take 0.5 mg by mouth daily.    [provider]  escitalopram  (LEXAPRO ) 10 MG tablet Take 10 mg by mouth daily. 01/10/24   [provider]  furosemide  (LASIX ) 20 MG tablet TAKE 1 TABLET(20 MG) BY MOUTH DAILY AS NEEDED 09/12/23   Verlin Lonni BIRCH, MD  losartan  (COZAAR ) 50 MG tablet Take 1 tablet (50 mg total) by mouth daily. 02/07/24 05/07/24  Jeffrie Oneil BROCKS, MD  Melatonin 5 MG TABS Take 10-15 mg by mouth as needed (for sleep).    [provider]  Multiple Vitamin (MULTIVITAMIN WITH MINERALS) TABS tablet Take 1 tablet by mouth daily. 12/26/21   Cheryle Page, MD  pantoprazole  (PROTONIX ) 40 MG tablet Take 1 tablet (40 mg total) by mouth daily. 09/27/18   Vann, Jessica U, DO  polyethylene glycol (MIRALAX ) 17 g packet Take 17 g by mouth daily as needed for moderate constipation. Patient taking differently: Take 17 g by mouth as needed for moderate constipation. 12/25/21  Cheryle Page, MD  potassium chloride  (KLOR-CON ) 10 MEQ tablet Take 1 tablet (10 mEq total) by mouth daily. 09/12/23   Verlin Lonni BIRCH, MD  senna-docusate (SENOKOT-S) 8.6-50 MG tablet Take 1 tablet by mouth 2 (two) times daily. Patient taking differently: Take 1 tablet by mouth as needed. 12/25/21   Cheryle Page, MD  SKYRIZI  PEN 150 MG/ML SOAJ INJECT 150MG  SUBCUTANEOUSLY EVERY 12 WEEKS AS DIRECTED. 02/02/22   Livingston Rigg, MD    Physical Exam: Constitutional: Moderately built and nourished. Vitals:   06/18/24 2125 06/18/24 2200 06/18/24 2342 06/19/24 0015  BP: (!) 181/69 (!) 189/85 (!) 185/66   Pulse: 65 73 71   Resp: (!) 21 (!) 21 20   Temp: 98.4 F (36.9 C)   98.1 F (36.7 C)  TempSrc: Oral   Oral  SpO2: 97% 96% 98%    Eyes: Anicteric no pallor. ENMT: No discharge from the ears eyes nose and mouth: Neck:  No mass felt.  No neck rigidity. Respiratory: No rhonchi or crepitations. Cardiovascular:  S1 S2 heard. Abdomen: Soft nontender bowel sound present. Musculoskeletal: Left ankle appears inflamed. Skin: Same on the left ankle.  Bruising on the knees. Neurologic: Alert awake oriented time place and person.  Moves all extremities. Psychiatric: Oriented to time place and person.   Labs on Admission: I have personally reviewed following labs and imaging studies  CBC: Recent Labs  Lab 06/18/24 1619  WBC 13.0*  NEUTROABS 11.2*  HGB 13.1  HCT 41.7  MCV 89.9  PLT 151   Basic Metabolic Panel: Recent Labs  Lab 06/18/24 1619  NA 138  K 5.2*  CL 102  CO2 23  GLUCOSE 180*  BUN 16  CREATININE 1.48*  CALCIUM  9.2   GFR: CrCl cannot be calculated (Unknown ideal weight.). Liver Function Tests: Recent Labs  Lab 06/18/24 1619  AST 51*  ALT 38  ALKPHOS 74  BILITOT 1.2  PROT 7.4  ALBUMIN 4.0   Recent Labs  Lab 06/18/24 1619  LIPASE 40   No results for input(s): AMMONIA in the last 168 hours. Coagulation Profile: No results for input(s): INR, PROTIME in the last 168 hours. Cardiac Enzymes: No results for input(s): CKTOTAL, CKMB, CKMBINDEX, TROPONINI in the last 168 hours. BNP (last 3 results) No results for input(s): PROBNP in the last 8760 hours. HbA1C: No results for input(s): HGBA1C in the last 72 hours. CBG: No results for input(s): GLUCAP in the last 168 hours. Lipid Profile: No results for input(s): CHOL, HDL, LDLCALC, TRIG, CHOLHDL, LDLDIRECT in the last 72 hours. Thyroid  Function Tests: No results for input(s): TSH, T4TOTAL, FREET4, T3FREE, THYROIDAB in the last 72 hours. Anemia Panel: No results for input(s): VITAMINB12, FOLATE, FERRITIN, TIBC, IRON, RETICCTPCT in the last 72 hours. Urine analysis:    Component Value Date/Time   COLORURINE YELLOW 06/18/2024 2031   APPEARANCEUR HAZY (A) 06/18/2024 2031    LABSPEC >1.046 (H) 06/18/2024 2031   PHURINE 5.0 06/18/2024 2031   GLUCOSEU NEGATIVE 06/18/2024 2031   HGBUR SMALL (A) 06/18/2024 2031   BILIRUBINUR NEGATIVE 06/18/2024 2031   KETONESUR NEGATIVE 06/18/2024 2031   PROTEINUR 30 (A) 06/18/2024 2031   UROBILINOGEN 1.0 04/14/2014 0116   NITRITE NEGATIVE 06/18/2024 2031   LEUKOCYTESUR NEGATIVE 06/18/2024 2031   Sepsis Labs: @LABRCNTIP (procalcitonin:4,lacticidven:4) )No results found for this or any previous visit (from the past 240 hours).   Radiological Exams on Admission: CT ABDOMEN PELVIS W CONTRAST Result Date: 06/18/2024 EXAM: CT ABDOMEN AND PELVIS WITH CONTRAST 06/18/2024 07:07:45 PM TECHNIQUE:  CT of the abdomen and pelvis was performed with the administration of 80 mL of iohexol  (OMNIPAQUE ) 300 MG/ML solution. Multiplanar reformatted images are provided for review. Automated exposure control, iterative reconstruction, and/or weight-based adjustment of the mA/kV was utilized to reduce the radiation dose to as low as reasonably achievable. COMPARISON: 12/24/2021 CLINICAL HISTORY: Abdominal pain, acute, nonlocalized. Weakness, decreased appetite, and diarrhea over the past 16 hours. FINDINGS: LOWER CHEST: Cardiac valve prosthesis. Motion artifact in the lung bases. No focal consolidation. Right lower lung nodule centrally measuring 6 mm in diameter. No change since prior study. LIVER: Diffuse fatty infiltration of the liver. Scattered calcifications in the liver, likely granulomas. GALLBLADDER AND BILE DUCTS: Gallbladder surgically absent. Pneumobilia is likely postoperative. No biliary ductal dilatation. SPLEEN: Spleen is normal. PANCREAS: Pancreas is normal. ADRENAL GLANDS: Adrenal glands are normal. KIDNEYS, URETERS AND BLADDER: Multiple bilateral renal cysts, largest on the right measuring 3.5 cm in diameter. No change since prior study. No imaging follow-up is indicated. No stones in the kidneys or ureters. No hydronephrosis or hydroureter. No  perinephric or periureteral stranding. The bladder is normal. GI AND BOWEL: Stomach, small bowel, and colon are not abnormally distended. No wall thickening or inflammatory stranding. Diverticulosis of the sigmoid colon. Minimal pericolonic infiltration may represent early changes of acute diverticulitis. The appendix is normal. There is no bowel obstruction. PERITONEUM AND RETROPERITONEUM: No ascites. No free air. VASCULATURE: Aorta is normal in caliber. Calcification of the aorta. No aneurysm. LYMPH NODES: No lymphadenopathy. REPRODUCTIVE ORGANS: The prostate gland is not enlarged. BONES AND SOFT TISSUES: Degenerative changes in the spine. No acute osseous abnormality. No focal soft tissue abnormality. IMPRESSION: 1. Diverticulosis of the sigmoid colon with minimal pericolonic infiltration, possibly representing early changes of acute diverticulitis. No abscess. Electronically signed by: Elsie Gravely MD 06/18/2024 07:29 PM EST RP Workstation: [id number]   DG Knee Complete 4 Views Right Result Date: 06/18/2024 EXAM: 4 VIEW(S) XRAY OF THE KNEE 06/18/2024 04:46:00 PM COMPARISON: None available. CLINICAL HISTORY: pain after fall FINDINGS: BONES AND JOINTS: No acute fracture. No malalignment. No significant joint effusion. Minimal tricompartmental osteophyte formation consistent with osteoarthritis. SOFT TISSUES: Vascular calcifications are present. The remaining visualized soft tissues are unremarkable. IMPRESSION: 1. No acute fracture or dislocation. 2. Minimal tricompartmental osteoarthritis. Electronically signed by: Greig Pique MD 06/18/2024 05:17 PM EST RP Workstation: [id number]    EKG: Independently reviewed.  Normal sinus rhythm.  Assessment/Plan Principal Problem:   ARF (acute renal failure) Active Problems:   Benign essential HTN   S/P TAVR (transcatheter aortic valve replacement)   RA (rheumatoid arthritis) (HCC)   Dementia without behavioral disturbance, psychotic disturbance, mood  disturbance, or anxiety (HCC)   Unspecified cirrhosis of liver (HCC)   Psoriatic arthritis of multiple joints (HCC)   Diverticulitis   History of stroke    Acute renal failure could be from recent diarrhea and poor oral intake.  Creatinine increased from 2023.  Hold ARB and gently hydrate.  Follow metabolic panel.  Check stool studies. Acute sigmoid diverticulitis on empiric antibiotics.  No abscess seen on the CAT scan.  Closely monitor. Left ankle fracture after recent fall will consult orthopedics.  Addendum -   discussed with Dr. Vernetta orthopedics.  Per Dr. Vernetta nonoperative management and they will be ordering splints. Hypertension since patient has acute renal failure and mild hyperkalemia hold ARB.  Follow blood pressure trends will start patient on scheduled hydralazine  for now until we can restart ARB. History of bioprosthetic aortic valve replacement status post TAVR  and is on Eliquis  last EF measured was more than 75% on May 2025. History of stroke on Eliquis . History of psoriasis and rheumatoid arthritis on Cosentyx.  Previously used to be on methotrexate  and as per daughter was discontinued due to history of liver cirrhosis. History liver cirrhosis no acute issues at this time. History of BPH on Avodart . Hyperglycemia check hemoglobin A1c. History of dementia.  DVT prophylaxis: Eliquis . Code Status: DNR confirmed with patient's daughter. Family Communication: Daughter and granddaughter. Disposition Plan: Medical floor. Consults called: Orthopedics.  Physical therapy. Admission status: The patient.         [1]  Allergies Allergen Reactions   Ativan  [Lorazepam ] Other (See Comments)    hallucinations   Phenergan  [Promethazine  Hcl] Hypertension    hallucinations   "

## 2024-06-19 NOTE — Progress Notes (Signed)
 Orthopedic Tech Progress Note Patient Details:  [name] August 22, 1927 [id number]  Ortho Devices Type of Ortho Device: Ace wrap, Cotton web roll, Post (short leg) splint, Stirrup splint Ortho Device/Splint Location: well padded short leg left posterior and stirrup applied to left leg Ortho Device/Splint Interventions: Ordered, Application, Adjustment   Post Interventions Patient Tolerated: Well Instructions Provided: Adjustment of device, Care of device  [name] 06/19/2024, 8:38 AM

## 2024-06-19 NOTE — ED Notes (Signed)
 Pt attempted to get out of bed to void; RN assisted pt with urinal. RN and NT repositioned pt and provided blankets for comfort. NAD noted. RN reminded pt of leg injury and reactivated bed alarm.

## 2024-06-19 NOTE — Progress Notes (Signed)
 Orthopedic Tech Progress Note Patient Details:  [name] [birth year] [id number]  Patient ID: [name], male   DOB: 1928-01-08, 89 y.o.   MRN: [id number]  [name] 06/19/2024, 6:38 PM Pt removed previously applied splint. Re applied left splints

## 2024-06-19 NOTE — Evaluation (Signed)
 Physical Therapy Evaluation Patient Details Name: [name] MRN: [id number] DOB: 1927-11-22 Today's Date: 06/19/2024  History of Present Illness  The patient is a 89 year old gentleman who presented to the emergency room 06/18/24 with  diarrhea and weakness, and  difficulty walking after an apparant mechanical fall.SABRA  He was then admitted due to acute renal failure.  In the emergency room he was complaining of left ankle pain and was having difficulty with mobility and weightbearing on his left ankle.  A CT scan of the left ankle was obtained and it does show a trimalleolar ankle fracture. Treated with short leg posterior splint and NWB on the RLE  Clinical Impression  Pt admitted with above diagnosis.  Pt currently with functional limitations due to the deficits listed below (see PT Problem List). Pt will benefit from acute skilled PT to increase their independence and safety with mobility to allow discharge.       The patient is pleasant, able to participate in mobility assessment but not able to attempt standing at Rw safely with risk for WB on the LLE. Patient  most likely will be requiring WC transfers initially. No family present  and patient not able to fully understand with language barrier and  interpreter not available. Patient will benefit from continued inpatient follow up therapy, <3 hours/day unless family can support with patient  at transfer/WC level.     If plan is discharge home, recommend the following: Two people to help with walking and/or transfers;A lot of help with bathing/dressing/bathroom;Assist for transportation;Help with stairs or ramp for entrance   Can travel by private vehicle    no    Equipment Recommendations Wheelchair (measurements PT);Wheelchair cushion (measurements PT)  Recommendations for Other Services       Functional Status Assessment Patient has had a recent decline in their functional status and demonstrates the ability to make significant  improvements in function in a reasonable and predictable amount of time.     Precautions / Restrictions Precautions Precautions: Fall Precaution/Restrictions Comments: non English Restrictions Weight Bearing Restrictions Per Provider Order: Yes RLE Weight Bearing Per Provider Order: Weight bearing as tolerated LLE Weight Bearing Per Provider Order: Non weight bearing      Mobility  Bed Mobility Overal bed mobility: Needs Assistance Bed Mobility: Supine to Sit, Sit to Supine     Supine to sit: Mod assist, +2 for physical assistance, +2 for safety/equipment, HOB elevated Sit to supine: Mod assist, +2 for physical assistance, +2 for safety/equipment   General bed mobility comments: assist left leg and trunk to sit upright and scoot, assist both legs and trunk to supine    Transfers Overall transfer level: Needs assistance Equipment used: Rolling walker (2 wheels)               General transfer comment: placed RW in front, RLE on floor, made attempt to stand  but patient unable to safely stand due to impending WB on the LLE so stopped attempt    Ambulation/Gait                  Stairs            Wheelchair Mobility     Tilt Bed    Modified Rankin (Stroke Patients Only)       Balance Overall balance assessment: Needs assistance, History of Falls Sitting-balance support: Bilateral upper extremity supported, Feet supported Sitting balance-Leahy Scale: Fair         Standing balance comment: unable  Pertinent Vitals/Pain Pain Assessment Pain Assessment: Faces Faces Pain Scale: Hurts even more Pain Location: points to left foot/leg Pain Descriptors / Indicators: Grimacing, Guarding Pain Intervention(s): Limited activity within patient's tolerance    Home Living Family/patient expects to be discharged to:: Private residence Living Arrangements: Children Available Help at Discharge: Family;Available  PRN/intermittently Type of Home: House Home Access: Stairs to enter Entrance Stairs-Rails: Left Entrance Stairs-Number of Steps: 3-4   Home Layout: One level Home Equipment: Rolling Walker (2 wheels);Grab bars - tub/shower;Cane - single point Additional Comments: all info  from previous encounter, no interpreter available    Prior Function               Mobility Comments: unsure, if needs assistance, uses RW at least       Extremity/Trunk Assessment   Upper Extremity Assessment Upper Extremity Assessment: Overall WFL for tasks assessed    Lower Extremity Assessment Lower Extremity Assessment: LLE deficits/detail RLE Deficits / Details: grossly WFL LLE Deficits / Details: short leg posterior splint    Cervical / Trunk Assessment Cervical / Trunk Assessment: Normal  Communication   Communication Communication: Impaired Factors Affecting Communication: Non - English speaking, interpreter not available    Cognition Arousal: Alert Behavior During Therapy: WFL for tasks assessed/performed   PT - Cognitive impairments: Difficult to assess Difficult to assess due to: Non-English speaking                     PT - Cognition Comments: no family of interpreter availabnle, patient did follow some directions and  cues Following commands: Intact       Cueing Cueing Techniques: Verbal cues, Gestural cues, Tactile cues     General Comments      Exercises     Assessment/Plan    PT Assessment Patient needs continued PT services  PT Problem List Decreased strength;Decreased range of motion;Decreased activity tolerance;Decreased balance;Decreased mobility;Decreased safety awareness;Pain       PT Treatment Interventions DME instruction;Gait training;Functional mobility training;Therapeutic activities;Therapeutic exercise;Patient/family education;Wheelchair mobility training    PT Goals (Current goals can be found in the Care Plan section)  Acute Rehab PT  Goals Patient Stated Goal: agreed to mobility PT Goal Formulation: With patient Time For Goal Achievement: 07/03/24 Potential to Achieve Goals: Good    Frequency Min 2X/week     Co-evaluation               AM-PAC PT 6 Clicks Mobility  Outcome Measure Help needed turning from your back to your side while in a flat bed without using bedrails?: A Little Help needed moving from lying on your back to sitting on the side of a flat bed without using bedrails?: A Lot Help needed moving to and from a bed to a chair (including a wheelchair)?: Total Help needed standing up from a chair using your arms (e.g., wheelchair or bedside chair)?: Total Help needed to walk in hospital room?: Total Help needed climbing 3-5 steps with a railing? : Total 6 Click Score: 9    End of Session Equipment Utilized During Treatment: Gait belt Activity Tolerance: Patient tolerated treatment well Patient left: in bed;with call bell/phone within reach;with bed alarm set Nurse Communication: Mobility status PT Visit Diagnosis: Unsteadiness on feet (R26.81);Other abnormalities of gait and mobility (R26.89);Muscle weakness (generalized) (M62.81);History of falling (Z91.81);Pain Pain - Right/Left: Left Pain - part of body: Ankle and joints of foot    Time: 1048-1101 PT Time Calculation (min) (ACUTE ONLY): 13 min  Charges:   PT Evaluation $PT Eval Low Complexity: 1 Low   PT General Charges $$ ACUTE PT VISIT: 1 Visit         Darice Potters PT Acute Rehabilitation Services Office [phone]  Potters [name] 06/19/2024, 2:52 PM

## 2024-06-19 NOTE — ED Notes (Addendum)
 Pt refused cardiac monitoring and removed leads multiple times.

## 2024-06-19 NOTE — ED Notes (Signed)
 Pt pulled out IV, removed all monitoring equipment, gown and brief.

## 2024-06-19 NOTE — ED Notes (Signed)
 Patient transported to CT

## 2024-06-19 NOTE — ED Notes (Signed)
 [phone] - Marina  patient family member

## 2024-06-19 NOTE — Progress Notes (Addendum)
 " PROGRESS NOTE    [name]  FMW:[id number] DOB: 13-Jun-1928 DOA: 06/18/2024 PCP: Rexanne Ingle, MD   Brief Narrative:  HPI: [name] is a 89 y.o. male with history of bioprosthetic aortic valve replacement status post TAVR, history of CVA on Eliquis , hypertension, BPH, rheumatoid arthritis, psoriasis on Cosentyx, liver cirrhosis was brought to the ER after patient was found to be weak and had difficulty walking.  Per daughter patient has had some diarrhea and was walking to the bathroom when he had a fall following which he was finding difficult to ambulate.  Since patient had persistent symptoms he was brought to the ER.   ED Course: In the ER patient's labs show creatinine of 1.4 which increased from normal in 2023.  WBC 13 CT scan of the abdomen pelvis shows possible developing sigmoid diverticulitis.  Exam patient also has bruising on his knees x-rays of the right knee did not show any fracture.  Left ankle area looked inflamed and tender and CT scan shows acute fracture.  EKG shows normal sinus rhythm patient was started on fluids antibiotics for acute renal failure and diverticulitis.  Admitted for further observation.  Assessment & Plan:   Principal Problem:   ARF (acute renal failure) Active Problems:   Benign essential HTN   S/P TAVR (transcatheter aortic valve replacement)   RA (rheumatoid arthritis) (HCC)   Dementia without behavioral disturbance, psychotic disturbance, mood disturbance, or anxiety (HCC)   Unspecified cirrhosis of liver (HCC)   Psoriatic arthritis of multiple joints (HCC)   Diverticulitis   History of stroke   Closed left ankle fracture  Acute renal failure: Likely from from recent diarrhea and poor oral intake.  Baseline creatinine around 1, presented with 1.48 which has been stable since yesterday.  Continue IV fluids until tomorrow. Diarrhea and acute sigmoid diverticulitis: CT abdomen confirms early stages of diverticulitis, patient on Rocephin  and  Flagyl .  Tolerating regular diet.  C. difficile and GI pathogen panel pending. Left ankle fracture after recent fall will consult orthopedics.  Orthopedics consulted, per Dr. Vernetta nonoperative management and they will be ordering splints.  PT OT can work with the patient with nonweightbearing on the left lower extremity. Hypertension since patient has acute renal failure and mild hyperkalemia hold ARB.  Currently on scheduled hydralazine .  Blood pressure only slightly elevated. History of bioprosthetic aortic valve replacement status post TAVR and is on Eliquis  last EF measured was more than 75% on May 2025. History of stroke on Eliquis . History of psoriasis and rheumatoid arthritis on Cosentyx.  Previously used to be on methotrexate  and as per daughter was discontinued due to history of liver cirrhosis. History liver cirrhosis no acute issues at this time. History of BPH on Avodart . Hyperglycemia check hemoglobin A1c. History of dementia. Addendum: Hemoglobin A1c 7.0.  Mutilated for type 2 diabetes mellitus.  Starting on SSI.  DVT prophylaxis:    Code Status: Limited: Do not attempt resuscitation (DNR) -DNR-LIMITED -Do Not Intubate/DNI   Family Communication: Niec present at bedside.  Plan of care discussed with patient in length and he/she verbalized understanding and agreed with it.  Status is: Observation The patient will require care spanning > 2 midnights and should be moved to inpatient because: Still with pain, needs to be seen by PT OT.   Estimated body mass index is 31.61 kg/m as calculated from the following:   Height as of 02/07/24: 5' 2.6 (1.59 m).   Weight as of 02/07/24: 79.9 kg.    Nutritional  Assessment: There is no height or weight on file to calculate BMI.. Seen by dietician.  I agree with the assessment and plan as outlined below: Nutrition Status:        . Skin Assessment: I have examined the patient's skin and I agree with the wound assessment as performed  by the wound care RN as outlined below:    Consultants:  Orthopedics-signed off  Procedures:  None  Antimicrobials:  Anti-infectives (From admission, onward)    Start     Dose/Rate Route Frequency Ordered Stop   06/19/24 2100  cefTRIAXone  (ROCEPHIN ) 2 g in sodium chloride  0.9 % 100 mL IVPB        2 g 200 mL/hr over 30 Minutes Intravenous Every 24 hours 06/19/24 0019     06/19/24 1000  metroNIDAZOLE  (FLAGYL ) IVPB 500 mg        500 mg 100 mL/hr over 60 Minutes Intravenous Every 12 hours 06/19/24 0019     06/18/24 2115  cefTRIAXone  (ROCEPHIN ) 2 g in sodium chloride  0.9 % 100 mL IVPB       Placed in And Linked Group   2 g 200 mL/hr over 30 Minutes Intravenous  Once 06/18/24 2102 06/18/24 2153   06/18/24 2115  metroNIDAZOLE  (FLAGYL ) IVPB 500 mg       Placed in And Linked Group   500 mg 100 mL/hr over 60 Minutes Intravenous  Once 06/18/24 2102 06/18/24 2257         Subjective: Patient seen and examined, niece at the bedside.  Niece is a physician as well.  Per patient, he is feeling well.  No nausea, vomiting, abdominal pain or diarrhea.  His only complaint is left ankle pain.  Objective: Vitals:   06/19/24 0200 06/19/24 0445 06/19/24 0448 06/19/24 0546  BP: (!) 182/69 (!) 146/80  (!) 177/66  Pulse: 72 70  66  Resp: (!) 23 17  20   Temp:   98.1 F (36.7 C)   TempSrc:   Oral   SpO2: 94% 94%  95%    Intake/Output Summary (Last 24 hours) at 06/19/2024 0816 Last data filed at 06/19/2024 0545 Gross per 24 hour  Intake --  Output 200 ml  Net -200 ml   There were no vitals filed for this visit.  Examination:  General exam: Appears calm and comfortable  Respiratory system: Clear to auscultation. Respiratory effort normal. Cardiovascular system: S1 & S2 heard, RRR. No JVD, murmurs, rubs, gallops or clicks. No pedal edema. Gastrointestinal system: Abdomen is nondistended, soft and nontender. No organomegaly or masses felt. Normal bowel sounds heard. Central nervous  system: Alert and oriented. No focal neurological deficits. Extremities: Dressing in the left ankle. Psychiatry: Judgement and insight appear normal. Mood & affect appropriate.    Data Reviewed: I have personally reviewed following labs and imaging studies  CBC: Recent Labs  Lab 06/18/24 1619 06/19/24 0215  WBC 13.0* 8.8  NEUTROABS 11.2*  --   HGB 13.1 13.0  HCT 41.7 40.4  MCV 89.9 88.6  PLT 151 154   Basic Metabolic Panel: Recent Labs  Lab 06/18/24 1619 06/19/24 0215  NA 138 140  K 5.2* 4.2  CL 102 105  CO2 23 24  GLUCOSE 180* 167*  BUN 16 19  CREATININE 1.48* 1.46*  CALCIUM  9.2 8.9   GFR: CrCl cannot be calculated (Unknown ideal weight.). Liver Function Tests: Recent Labs  Lab 06/18/24 1619 06/19/24 0215  AST 51* 60*  ALT 38 38  ALKPHOS 74 76  BILITOT 1.2  1.0  PROT 7.4 7.0  ALBUMIN 4.0 3.9   Recent Labs  Lab 06/18/24 1619  LIPASE 40   No results for input(s): AMMONIA in the last 168 hours. Coagulation Profile: No results for input(s): INR, PROTIME in the last 168 hours. Cardiac Enzymes: No results for input(s): CKTOTAL, CKMB, CKMBINDEX, TROPONINI in the last 168 hours. BNP (last 3 results) No results for input(s): PROBNP in the last 8760 hours. HbA1C: No results for input(s): HGBA1C in the last 72 hours. CBG: No results for input(s): GLUCAP in the last 168 hours. Lipid Profile: No results for input(s): CHOL, HDL, LDLCALC, TRIG, CHOLHDL, LDLDIRECT in the last 72 hours. Thyroid  Function Tests: No results for input(s): TSH, T4TOTAL, FREET4, T3FREE, THYROIDAB in the last 72 hours. Anemia Panel: No results for input(s): VITAMINB12, FOLATE, FERRITIN, TIBC, IRON, RETICCTPCT in the last 72 hours. Sepsis Labs: No results for input(s): PROCALCITON, LATICACIDVEN in the last 168 hours.  No results found for this or any previous visit (from the past 240 hours).   Radiology Studies: CT ANKLE LEFT  WO CONTRAST Result Date: 06/19/2024 EXAM: CT LEFT ANKLE, WITHOUT IV CONTRAST 06/19/2024 03:01:05 AM TECHNIQUE: Axial images were acquired through the left ankle without IV contrast. Reformatted images were reviewed. Automated exposure control, iterative reconstruction, and/or weight based adjustment of the mA/kV was utilized to reduce the radiation dose to as low as reasonably achievable. COMPARISON: None. CLINICAL HISTORY: pain FINDINGS: BONES: There is an acute closed trimalleolar left ankle fracture. There is mild osteopenia. In the fibula, there is a comminuted distal shaft fracture along the syndesmosis above the level of the tibial plafond with a dorsal butterfly fragment and a few tiny comminuted fragments laterally at the fracture margin. The main distal fragment is displaced anteriorly and laterally by 1 cortex width. There is a nondisplaced transverse oblique fracture of the distal lateral malleolus, and a nondisplaced transverse fracture of the medial malleolus. An additional more or less curvilinear fracture extends from the lateral aspect of the tibial plafond into the posterior malleolar area, and there is an impaction injury to the posteromedial distal tibial metaphysis with a few small chip fracture fragments above the level of the malleoli. There is a tiny bipartite accessory navicular bone. There are no further fractures. There are mild background degenerative changes with trace spurring of the medial malleolus and the talonavicular joint, and small noninflammatory dorsal and plantar calcaneal spurs. JOINTS: No dislocation. There is mild narrowing of the medial mortise and mild widening of the lateral mortise consistent with ligamentous trauma. Trace spurring of the talonavicular joint. SOFT TISSUES: There is moderate diffuse soft tissue edema. No superficial or deep space hematoma is seen. The area tendons grossly intact. The area ligaments are not well seen with CT. There are calcifications in  the anterior tibial and peroneal arteries, to a lesser extent in the posterior tibial artery. IMPRESSION: 1. Acute closed trimalleolar left ankle fracture with comminuted distal fibular shaft fracture (displaced anteriorly and laterally by 1 cortex width), nondisplaced distal lateral malleolus fracture, nondisplaced medial malleolus fracture, and a curvilinear fracture extending from the lateral tibial plafond to the posterior malleolus . 2. Mild narrowing of the medial mortise and mild widening of the lateral mortise, consistent with ligamentous trauma. 3. Moderate diffuse soft tissue edema without superficial or deep space hematoma. 4. Calcific arteriosclerosis. Electronically signed by: Francis Quam MD 06/19/2024 03:30 AM EST RP Workstation: [id number]   CT HEAD WO CONTRAST ( ) Result Date: 06/19/2024 EXAM: CT HEAD WITHOUT CONTRAST 06/19/2024  01:51:26 AM TECHNIQUE: CT of the head was performed without the administration of intravenous contrast. Automated exposure control, iterative reconstruction, and/or weight based adjustment of the mA/kV was utilized to reduce the radiation dose to as low as reasonably achievable. COMPARISON: 12/20/2021 CLINICAL HISTORY: fall FINDINGS: BRAIN AND VENTRICLES: No acute hemorrhage. No evidence of acute infarct. No hydrocephalus. No extra-axial collection. No mass effect or midline shift. Atrophy and chronic small vessel disease throughout the deep white matter. ORBITS: No acute abnormality. SINUSES: No acute abnormality. SOFT TISSUES AND SKULL: No acute soft tissue abnormality. No skull fracture. IMPRESSION: 1. No acute intracranial abnormality. Electronically signed by: Franky Crease MD 06/19/2024 02:00 AM EST RP Workstation: [id number]   CT ABDOMEN PELVIS W CONTRAST Result Date: 06/18/2024 EXAM: CT ABDOMEN AND PELVIS WITH CONTRAST 06/18/2024 07:07:45 PM TECHNIQUE: CT of the abdomen and pelvis was performed with the administration of 80 mL of iohexol  (OMNIPAQUE ) 300 MG/ML  solution. Multiplanar reformatted images are provided for review. Automated exposure control, iterative reconstruction, and/or weight-based adjustment of the mA/kV was utilized to reduce the radiation dose to as low as reasonably achievable. COMPARISON: 12/24/2021 CLINICAL HISTORY: Abdominal pain, acute, nonlocalized. Weakness, decreased appetite, and diarrhea over the past 16 hours. FINDINGS: LOWER CHEST: Cardiac valve prosthesis. Motion artifact in the lung bases. No focal consolidation. Right lower lung nodule centrally measuring 6 mm in diameter. No change since prior study. LIVER: Diffuse fatty infiltration of the liver. Scattered calcifications in the liver, likely granulomas. GALLBLADDER AND BILE DUCTS: Gallbladder surgically absent. Pneumobilia is likely postoperative. No biliary ductal dilatation. SPLEEN: Spleen is normal. PANCREAS: Pancreas is normal. ADRENAL GLANDS: Adrenal glands are normal. KIDNEYS, URETERS AND BLADDER: Multiple bilateral renal cysts, largest on the right measuring 3.5 cm in diameter. No change since prior study. No imaging follow-up is indicated. No stones in the kidneys or ureters. No hydronephrosis or hydroureter. No perinephric or periureteral stranding. The bladder is normal. GI AND BOWEL: Stomach, small bowel, and colon are not abnormally distended. No wall thickening or inflammatory stranding. Diverticulosis of the sigmoid colon. Minimal pericolonic infiltration may represent early changes of acute diverticulitis. The appendix is normal. There is no bowel obstruction. PERITONEUM AND RETROPERITONEUM: No ascites. No free air. VASCULATURE: Aorta is normal in caliber. Calcification of the aorta. No aneurysm. LYMPH NODES: No lymphadenopathy. REPRODUCTIVE ORGANS: The prostate gland is not enlarged. BONES AND SOFT TISSUES: Degenerative changes in the spine. No acute osseous abnormality. No focal soft tissue abnormality. IMPRESSION: 1. Diverticulosis of the sigmoid colon with minimal  pericolonic infiltration, possibly representing early changes of acute diverticulitis. No abscess. Electronically signed by: Elsie Gravely MD 06/18/2024 07:29 PM EST RP Workstation: [id number]   DG Knee Complete 4 Views Right Result Date: 06/18/2024 EXAM: 4 VIEW(S) XRAY OF THE KNEE 06/18/2024 04:46:00 PM COMPARISON: None available. CLINICAL HISTORY: pain after fall FINDINGS: BONES AND JOINTS: No acute fracture. No malalignment. No significant joint effusion. Minimal tricompartmental osteophyte formation consistent with osteoarthritis. SOFT TISSUES: Vascular calcifications are present. The remaining visualized soft tissues are unremarkable. IMPRESSION: 1. No acute fracture or dislocation. 2. Minimal tricompartmental osteoarthritis. Electronically signed by: Greig Pique MD 06/18/2024 05:17 PM EST RP Workstation: [id number]    Scheduled Meds:  apixaban   5 mg Oral BID   dutasteride   0.5 mg Oral Daily   escitalopram   10 mg Oral QHS   hydrALAZINE   25 mg Oral Q8H   pantoprazole   40 mg Oral Daily   Continuous Infusions:  cefTRIAXone  (ROCEPHIN )  IV     lactated ringers   100 mL/hr at 06/19/24 0214   metronidazole        LOS: 0 days   Fredia Skeeter, MD [facility]  06/19/2024, 8:16 AM   *Please note that this is a verbal dictation therefore any spelling or grammatical errors are due to the Dragon [employer] One system interpretation.  Please page via Amion and do not message via secure chat for urgent patient care matters. Secure chat can be used for non urgent patient care matters.  How to contact the [facility] Attending or Consulting provider 7A - 7P or covering provider during after hours 7P -7A, for this patient?  Check the care team in [facility] and look for a) attending/consulting [facility] provider listed and b) the [facility] team listed. Page or secure chat 7A-7P. Log into [email or web address] and use [facility] universal password to access. If you do not have the password, please contact the hospital  operator. Locate the [facility] provider you are looking for under [facility] and page to a number that you can be directly reached. If you still have difficulty reaching the provider, please page the [facility] (Director on Call) for the Hospitalists listed on amion for assistance.  "

## 2024-06-19 NOTE — Consult Note (Signed)
 Reason for Consult: Left ankle fracture Referring Physician: [facility]  [name] is an 89 y.o. male.  HPI: The patient is a 89 year old gentleman who presented to the emergency room yesterday after having some issues with diarrhea and weakness with difficulty walking.  He apparently had a mechanical fall when he was ambulating prior to being brought to the emergency room by his daughter.  He was then admitted due to acute renal failure.  In the emergency room he was complaining of left ankle pain and was having difficulty with mobility and weightbearing on his left ankle.  A CT scan of the ankle was obtained and it does show a trimalleolar ankle fracture.  Orthopedic surgery was consulted to address this injury.  He is still in the emergency room secondary to the hospital being full and waiting for a bed.  He is awake and alert and does follow some limited commands.  He does speak some broken English.  He does report having left ankle pain.  He is now in a well-padded splint that we had ordered earlier for the left ankle.  Past Medical History:  Diagnosis Date   BPH (benign prostatic hypertrophy)    Depression    Diverticulosis of colon    GERD (gastroesophageal reflux disease)    Gilbert's syndrome    History of kidney stones    HLD (hyperlipidemia)    HOH (hard of hearing)    refuses to wear his Hearing Aid   Hypertension    Inguinal hernia    right   Internal hemorrhoid    Normal coronary arteries    a. Cath 07/2010: normal coronaries. Felt to have noncardiac CP/SOB at that time possibly r/t anxiety.   RA (rheumatoid arthritis) (HCC)    bilateral hands/ wrist--  seronegative   S/P TAVR (transcatheter aortic valve replacement) 03/27/2018   23 mm Edwards Sapien 3 transcatheter heart valve placed via percutaneous right transfemoral approach    Severe aortic stenosis    Thyroid  nodule    noted 11-2009   Wears dentures     Past Surgical History:  Procedure Laterality Date    CARDIAC CATHETERIZATION  07-19-2010  dr wilbert turner   normal coronary arteries,  ef 60%,  moderate aortic stenosis- gradient , normal right heart pressure   CARDIOVASCULAR STRESS TEST  05/ 2011   dr jeffrie   normal low risk perfusion study   CATARACT EXTRACTION W/ INTRAOCULAR LENS  IMPLANT, BILATERAL  2013   CATARACT EXTRACTION, BILATERAL     CHOLECYSTECTOMY N/A 10/11/2018   Procedure: LAPAROSCOPIC CHOLECYSTECTOMY WITH INTRAOPERATIVE CHOLANGIOGRAM;  Surgeon: Curvin Deward MOULD, MD;  Location: [facility] OR;  Service: General;  Laterality: N/A;   COLONOSCOPY  2010  approx   ERCP N/A 09/25/2018   Procedure: ENDOSCOPIC RETROGRADE CHOLANGIOPANCREATOGRAPHY (ERCP);  Surgeon: Rosalie Kitchens, MD;  Location: [facility] ENDOSCOPY;  Service: Endoscopy;  Laterality: N/A;   INGUINAL HERNIA REPAIR Right 08/23/2018   Procedure: OPEN RIGHT INGUINAL HERNIA REPAIR WITH MESH;  Surgeon: Eletha Boas, MD;  Location: [facility] ORS;  Service: General;  Laterality: Right;   INTRAOPERATIVE TRANSTHORACIC ECHOCARDIOGRAM N/A 03/27/2018   Procedure: INTRAOPERATIVE TRANSTHORACIC ECHOCARDIOGRAM;  Surgeon: Verlin Lonni BIRCH, MD;  Location: [facility] OR;  Service: Open Heart Surgery;  Laterality: N/A;   REMOVAL OF STONES  09/25/2018   Procedure: REMOVAL OF STONES;  Surgeon: Rosalie Kitchens, MD;  Location: [facility] ENDOSCOPY;  Service: Endoscopy;;   RIGHT/LEFT HEART CATH AND CORONARY ANGIOGRAPHY N/A 02/06/2018   Procedure: RIGHT/LEFT HEART CATH AND CORONARY ANGIOGRAPHY;  Surgeon: Verlin Lonni BIRCH, MD;  Location: [facility] INVASIVE CV LAB;  Service: Cardiovascular;  Laterality: N/A;   SPHINCTEROTOMY  09/25/2018   Procedure: SPHINCTEROTOMY;  Surgeon: Rosalie Kitchens, MD;  Location: [facility] ENDOSCOPY;  Service: Endoscopy;;   TEE WITHOUT CARDIOVERSION N/A 12/24/2021   Procedure: TRANSESOPHAGEAL ECHOCARDIOGRAM (TEE);  Surgeon: Alvan Ronal BRAVO, MD;  Location: [facility] ENDOSCOPY;  Service: Cardiovascular;  Laterality: N/A;   THYROID  LOBECTOMY Right 05/05/2015   Procedure: RIGHT THYROID   LOBECTOMY;  Surgeon: Krystal Spinner, MD;  Location: [facility] OR;  Service: General;  Laterality: Right;   TRANSANAL HEMORRHOIDAL DEARTERIALIZATION N/A 04/09/2014   Procedure: TRANSANAL HEMORRHOIDAL DEARTERIALIZATION OF INTERNAL HEMORROIDS;  Surgeon: Bernarda Ned, MD;  Location: [facility];  Service: General;  Laterality: N/A;   TRANSCATHETER AORTIC VALVE REPLACEMENT, TRANSFEMORAL  03/27/2018   TRANSCATHETER AORTIC VALVE REPLACEMENT, TRANSFEMORAL N/A 03/27/2018   Procedure: TRANSCATHETER AORTIC VALVE REPLACEMENT, TRANSFEMORAL. Edwards SAPIEN 3 Transcatheter Heart Valve 23mm.;  Surgeon: Verlin Lonni BIRCH, MD;  Location: [facility] OR;  Service: Open Heart Surgery;  Laterality: N/A;   TRANSTHORACIC ECHOCARDIOGRAM  11-26-2013   moderate focal basal and mild LVH/  ef 65-70%/  grade I diastolic dysfunction/ mild LAE/  moderate calcification with stenosis AV with mild regurg,  gradients 35 abd 58 mmHg /  mild TR    Family History  Problem Relation Age of Onset   Pulmonary embolism Mother 24       caused by complications of surgery   CVA Father    Diabetes Brother    Diabetes Brother    Breast cancer Sister     Social History:  reports that he quit smoking about 43 years ago. His smoking use included cigarettes. He started smoking about 51 years ago. He has never used smokeless tobacco. He reports current alcohol use. He reports that he does not use drugs.  Allergies: Allergies[1]  Medications: I have reviewed the patient's current medications.  Results for orders placed or performed during the hospital encounter of 06/18/24 (from the past 48 hours)  CBC with Differential/Platelet     Status: Abnormal   Collection Time: 06/18/24  4:19 PM  Result Value Ref Range   WBC 13.0 (H) 4.0 - 10.5 K/uL   RBC 4.64 4.22 - 5.81 MIL/uL   Hemoglobin 13.1 13.0 - 17.0 g/dL   HCT 58.2 60.9 - 47.9 %   MCV 89.9 80.0 - 100.0 fL   MCH 28.2 26.0 - 34.0 pg   MCHC 31.4 30.0 - 36.0 g/dL   RDW 87.9 88.4 -  84.4 %   Platelets 151 150 - 400 K/uL   nRBC 0.0 0.0 - 0.2 %   Neutrophils Relative % 86 %   Neutro Abs 11.2 (H) 1.7 - 7.7 K/uL   Lymphocytes Relative 8 %   Lymphs Abs 1.0 0.7 - 4.0 K/uL   Monocytes Relative 5 %   Monocytes Absolute 0.7 0.1 - 1.0 K/uL   Eosinophils Relative 1 %   Eosinophils Absolute 0.1 0.0 - 0.5 K/uL   Basophils Relative 0 %   Basophils Absolute 0.0 0.0 - 0.1 K/uL   Immature Granulocytes 0 %   Abs Immature Granulocytes 0.05 0.00 - 0.07 K/uL    Comment: Performed at [facility], [address], [place], [place] [address]  Comprehensive metabolic panel with GFR     Status: Abnormal   Collection Time: 06/18/24  4:19 PM  Result Value Ref Range   Sodium 138 135 - 145 mmol/L   Potassium 5.2 (H)  3.5 - 5.1 mmol/L    Comment: HEMOLYSIS AT THIS LEVEL MAY AFFECT RESULT   Chloride 102 98 - 111 mmol/L   CO2 23 22 - 32 mmol/L   Glucose, Bld 180 (H) 70 - 99 mg/dL    Comment: Glucose reference range applies only to samples taken after fasting for at least 8 hours.   BUN 16 8 - 23 mg/dL   Creatinine, Ser 8.51 (H) 0.61 - 1.24 mg/dL   Calcium  9.2 8.9 - 10.3 mg/dL   Total Protein 7.4 6.5 - 8.1 g/dL   Albumin 4.0 3.5 - 5.0 g/dL   AST 51 (H) 15 - 41 U/L    Comment: HEMOLYSIS AT THIS LEVEL MAY AFFECT RESULT   ALT 38 0 - 44 U/L   Alkaline Phosphatase 74 38 - 126 U/L   Total Bilirubin 1.2 0.0 - 1.2 mg/dL   GFR, Estimated 44 (L) >60 mL/min    Comment: (NOTE) Calculated using the CKD-EPI Creatinine Equation (2021)    Anion gap 12 5 - 15    Comment: Performed at [facility], [address], [place], [place] [address]  Lipase, blood     Status: None   Collection Time: 06/18/24  4:19 PM  Result Value Ref Range   Lipase 40 11 - 51 U/L    Comment: Performed at [facility], [address], [place], [place] [address]  Urinalysis, w/ Reflex to Culture (Infection Suspected) -Urine, Clean Catch     Status: Abnormal   Collection  Time: 06/18/24  8:31 PM  Result Value Ref Range   Specimen Source URINE, CLEAN CATCH    Color, Urine YELLOW YELLOW   APPearance HAZY (A) CLEAR   Specific Gravity, Urine >1.046 (H) 1.005 - 1.030   pH 5.0 5.0 - 8.0   Glucose, UA NEGATIVE NEGATIVE mg/dL   Hgb urine dipstick SMALL (A) NEGATIVE   Bilirubin Urine NEGATIVE NEGATIVE   Ketones, ur NEGATIVE NEGATIVE mg/dL   Protein, ur 30 (A) NEGATIVE mg/dL   Nitrite NEGATIVE NEGATIVE   Leukocytes,Ua NEGATIVE NEGATIVE   RBC / HPF 0-5 0 - 5 RBC/hpf   WBC, UA 0-5 0 - 5 WBC/hpf    Comment:        Reflex urine culture not performed if WBC <=10, OR if Squamous epithelial cells >5. If Squamous epithelial cells >5 suggest recollection.    Bacteria, UA NONE SEEN NONE SEEN   Squamous Epithelial / HPF 0-5 0 - 5 /HPF   Mucus PRESENT     Comment: Performed at [facility], [address], [place], [place] [address]  Comprehensive metabolic panel     Status: Abnormal   Collection Time: 06/19/24  2:15 AM  Result Value Ref Range   Sodium 140 135 - 145 mmol/L   Potassium 4.2 3.5 - 5.1 mmol/L   Chloride 105 98 - 111 mmol/L   CO2 24 22 - 32 mmol/L   Glucose, Bld 167 (H) 70 - 99 mg/dL    Comment: Glucose reference range applies only to samples taken after fasting for at least 8 hours.   BUN 19 8 - 23 mg/dL   Creatinine, Ser 8.53 (H) 0.61 - 1.24 mg/dL   Calcium  8.9 8.9 - 10.3 mg/dL   Total Protein 7.0 6.5 - 8.1 g/dL   Albumin 3.9 3.5 - 5.0 g/dL   AST 60 (H) 15 - 41 U/L    Comment: HEMOLYSIS AT THIS LEVEL MAY AFFECT RESULT   ALT 38  0 - 44 U/L   Alkaline Phosphatase 76 38 - 126 U/L   Total Bilirubin 1.0 0.0 - 1.2 mg/dL   GFR, Estimated 44 (L) >60 mL/min    Comment: (NOTE) Calculated using the CKD-EPI Creatinine Equation (2021)    Anion gap 11 5 - 15    Comment: Performed at [facility], [address] [place], [place] [address]  CBC     Status: None   Collection Time: 06/19/24  2:15 AM  Result Value Ref  Range   WBC 8.8 4.0 - 10.5 K/uL   RBC 4.56 4.22 - 5.81 MIL/uL   Hemoglobin 13.0 13.0 - 17.0 g/dL   HCT 59.5 60.9 - 47.9 %   MCV 88.6 80.0 - 100.0 fL   MCH 28.5 26.0 - 34.0 pg   MCHC 32.2 30.0 - 36.0 g/dL   RDW 88.0 88.4 - 84.4 %   Platelets 154 150 - 400 K/uL   nRBC 0.0 0.0 - 0.2 %    Comment: Performed at [facility], [address] [place], [place] [address]  Hemoglobin A1c     Status: Abnormal   Collection Time: 06/19/24  2:15 AM  Result Value Ref Range   Hgb A1c MFr Bld 7.0 (H) 4.8 - 5.6 %    Comment: (NOTE) Diagnosis of Diabetes The following HbA1c ranges recommended by the American Diabetes Association (ADA) may be used as an aid in the diagnosis of diabetes mellitus.  Hemoglobin             Suggested A1C NGSP%              Diagnosis  <5.7                   Non Diabetic  5.7-6.4                Pre-Diabetic  >6.4                   Diabetic  <7.0                   Glycemic control for                       adults with diabetes.     Mean Plasma Glucose 154.2 mg/dL    Comment: Performed at [facility], [address] [place], [place] [address]    CT ANKLE LEFT WO CONTRAST Result Date: 06/19/2024 EXAM: CT LEFT ANKLE, WITHOUT IV CONTRAST 06/19/2024 03:01:05 AM TECHNIQUE: Axial images were acquired through the left ankle without IV contrast. Reformatted images were reviewed. Automated exposure control, iterative reconstruction, and/or weight based adjustment of the mA/kV was utilized to reduce the radiation dose to as low as reasonably achievable. COMPARISON: None. CLINICAL HISTORY: pain FINDINGS: BONES: There is an acute closed trimalleolar left ankle fracture. There is mild osteopenia. In the fibula, there is a comminuted distal shaft fracture along the syndesmosis above the level of the tibial plafond with a dorsal butterfly fragment and a few tiny comminuted fragments laterally at the fracture margin. The main distal fragment is displaced  anteriorly and laterally by 1 cortex width. There is a nondisplaced transverse oblique fracture of the distal lateral malleolus, and a nondisplaced transverse fracture of the medial malleolus. An additional more or less curvilinear fracture extends from the lateral aspect of the tibial plafond into the posterior malleolar area, and there is an impaction injury to the  posteromedial distal tibial metaphysis with a few small chip fracture fragments above the level of the malleoli. There is a tiny bipartite accessory navicular bone. There are no further fractures. There are mild background degenerative changes with trace spurring of the medial malleolus and the talonavicular joint, and small noninflammatory dorsal and plantar calcaneal spurs. JOINTS: No dislocation. There is mild narrowing of the medial mortise and mild widening of the lateral mortise consistent with ligamentous trauma. Trace spurring of the talonavicular joint. SOFT TISSUES: There is moderate diffuse soft tissue edema. No superficial or deep space hematoma is seen. The area tendons grossly intact. The area ligaments are not well seen with CT. There are calcifications in the anterior tibial and peroneal arteries, to a lesser extent in the posterior tibial artery. IMPRESSION: 1. Acute closed trimalleolar left ankle fracture with comminuted distal fibular shaft fracture (displaced anteriorly and laterally by 1 cortex width), nondisplaced distal lateral malleolus fracture, nondisplaced medial malleolus fracture, and a curvilinear fracture extending from the lateral tibial plafond to the posterior malleolus . 2. Mild narrowing of the medial mortise and mild widening of the lateral mortise, consistent with ligamentous trauma. 3. Moderate diffuse soft tissue edema without superficial or deep space hematoma. 4. Calcific arteriosclerosis. Electronically signed by: Francis Quam MD 06/19/2024 03:30 AM EST RP Workstation: [id number]   CT HEAD WO CONTRAST  ( ) Result Date: 06/19/2024 EXAM: CT HEAD WITHOUT CONTRAST 06/19/2024 01:51:26 AM TECHNIQUE: CT of the head was performed without the administration of intravenous contrast. Automated exposure control, iterative reconstruction, and/or weight based adjustment of the mA/kV was utilized to reduce the radiation dose to as low as reasonably achievable. COMPARISON: 12/20/2021 CLINICAL HISTORY: fall FINDINGS: BRAIN AND VENTRICLES: No acute hemorrhage. No evidence of acute infarct. No hydrocephalus. No extra-axial collection. No mass effect or midline shift. Atrophy and chronic small vessel disease throughout the deep white matter. ORBITS: No acute abnormality. SINUSES: No acute abnormality. SOFT TISSUES AND SKULL: No acute soft tissue abnormality. No skull fracture. IMPRESSION: 1. No acute intracranial abnormality. Electronically signed by: Franky Crease MD 06/19/2024 02:00 AM EST RP Workstation: [id number]   CT ABDOMEN PELVIS W CONTRAST Result Date: 06/18/2024 EXAM: CT ABDOMEN AND PELVIS WITH CONTRAST 06/18/2024 07:07:45 PM TECHNIQUE: CT of the abdomen and pelvis was performed with the administration of 80 mL of iohexol  (OMNIPAQUE ) 300 MG/ML solution. Multiplanar reformatted images are provided for review. Automated exposure control, iterative reconstruction, and/or weight-based adjustment of the mA/kV was utilized to reduce the radiation dose to as low as reasonably achievable. COMPARISON: 12/24/2021 CLINICAL HISTORY: Abdominal pain, acute, nonlocalized. Weakness, decreased appetite, and diarrhea over the past 16 hours. FINDINGS: LOWER CHEST: Cardiac valve prosthesis. Motion artifact in the lung bases. No focal consolidation. Right lower lung nodule centrally measuring 6 mm in diameter. No change since prior study. LIVER: Diffuse fatty infiltration of the liver. Scattered calcifications in the liver, likely granulomas. GALLBLADDER AND BILE DUCTS: Gallbladder surgically absent. Pneumobilia is likely postoperative. No  biliary ductal dilatation. SPLEEN: Spleen is normal. PANCREAS: Pancreas is normal. ADRENAL GLANDS: Adrenal glands are normal. KIDNEYS, URETERS AND BLADDER: Multiple bilateral renal cysts, largest on the right measuring 3.5 cm in diameter. No change since prior study. No imaging follow-up is indicated. No stones in the kidneys or ureters. No hydronephrosis or hydroureter. No perinephric or periureteral stranding. The bladder is normal. GI AND BOWEL: Stomach, small bowel, and colon are not abnormally distended. No wall thickening or inflammatory stranding. Diverticulosis of the sigmoid colon. Minimal pericolonic infiltration may represent early  changes of acute diverticulitis. The appendix is normal. There is no bowel obstruction. PERITONEUM AND RETROPERITONEUM: No ascites. No free air. VASCULATURE: Aorta is normal in caliber. Calcification of the aorta. No aneurysm. LYMPH NODES: No lymphadenopathy. REPRODUCTIVE ORGANS: The prostate gland is not enlarged. BONES AND SOFT TISSUES: Degenerative changes in the spine. No acute osseous abnormality. No focal soft tissue abnormality. IMPRESSION: 1. Diverticulosis of the sigmoid colon with minimal pericolonic infiltration, possibly representing early changes of acute diverticulitis. No abscess. Electronically signed by: Elsie Gravely MD 06/18/2024 07:29 PM EST RP Workstation: [id number]   DG Knee Complete 4 Views Right Result Date: 06/18/2024 EXAM: 4 VIEW(S) XRAY OF THE KNEE 06/18/2024 04:46:00 PM COMPARISON: None available. CLINICAL HISTORY: pain after fall FINDINGS: BONES AND JOINTS: No acute fracture. No malalignment. No significant joint effusion. Minimal tricompartmental osteophyte formation consistent with osteoarthritis. SOFT TISSUES: Vascular calcifications are present. The remaining visualized soft tissues are unremarkable. IMPRESSION: 1. No acute fracture or dislocation. 2. Minimal tricompartmental osteoarthritis. Electronically signed by: Greig Pique MD  06/18/2024 05:17 PM EST RP Workstation: [id number]    Review of Systems Blood pressure (!) 162/63, pulse 77, temperature 98.2 F (36.8 C), temperature source Oral, resp. rate (!) 22, SpO2 96%. Physical Exam Vitals reviewed.  Constitutional:      Appearance: Normal appearance.  Musculoskeletal:     Left ankle: Ecchymosis present. Tenderness present over the lateral malleolus, medial malleolus and posterior TF ligament. Decreased range of motion.  Neurological:     Mental Status: He is alert.     Assessment/Plan: Left nondisplaced trimalleolar ankle fracture  We did recommend a well-padded posterior splint with stirrups for the interim treatment of this left ankle fracture.  Fortunately it does align well enough to hold off on any type of surgical intervention.  He needs to be nonweightbearing on his left lower extremity for the next 4 to 6 weeks.  Even with surgery, he would need to be nonweightbearing for 4 to 6 weeks.  It would be better to try to avoid surgery given the patient's comorbidities and the fact that thus far it is in acceptable alignment.  We will see him in the office in a week for follow-up with repeat x-rays and likely converting him to a cast at that visit.  We will check on him while he is in the hospital.  Lonni CINDERELLA Poli 06/19/2024, 11:41 AM         [1]  Allergies Allergen Reactions   Ativan  [Lorazepam ] Other (See Comments)    hallucinations   Phenergan  [Promethazine  Hcl] Hypertension    hallucinations   Methocarbamol  Other (See Comments)    Raises blood pressure    Solifenacin Other (See Comments)    Not effective

## 2024-06-20 ENCOUNTER — Inpatient Hospital Stay (HOSPITAL_COMMUNITY)

## 2024-06-20 LAB — CBC WITH DIFFERENTIAL/PLATELET
Abs Immature Granulocytes: 0.02 K/uL (ref 0.00–0.07)
Basophils Absolute: 0 K/uL (ref 0.0–0.1)
Basophils Relative: 0 %
Eosinophils Absolute: 0.2 K/uL (ref 0.0–0.5)
Eosinophils Relative: 3 %
HCT: 36.5 % — ABNORMAL LOW (ref 39.0–52.0)
Hemoglobin: 12 g/dL — ABNORMAL LOW (ref 13.0–17.0)
Immature Granulocytes: 0 %
Lymphocytes Relative: 15 %
Lymphs Abs: 1.2 K/uL (ref 0.7–4.0)
MCH: 28.7 pg (ref 26.0–34.0)
MCHC: 32.9 g/dL (ref 30.0–36.0)
MCV: 87.3 fL (ref 80.0–100.0)
Monocytes Absolute: 0.6 K/uL (ref 0.1–1.0)
Monocytes Relative: 7 %
Neutro Abs: 6 K/uL (ref 1.7–7.7)
Neutrophils Relative %: 75 %
Platelets: 145 K/uL — ABNORMAL LOW (ref 150–400)
RBC: 4.18 MIL/uL — ABNORMAL LOW (ref 4.22–5.81)
RDW: 12.1 % (ref 11.5–15.5)
WBC: 8.1 K/uL (ref 4.0–10.5)
nRBC: 0 % (ref 0.0–0.2)

## 2024-06-20 LAB — BASIC METABOLIC PANEL WITH GFR
Anion gap: 14 (ref 5–15)
BUN: 14 mg/dL (ref 8–23)
CO2: 23 mmol/L (ref 22–32)
Calcium: 9.6 mg/dL (ref 8.9–10.3)
Chloride: 103 mmol/L (ref 98–111)
Creatinine, Ser: 1.28 mg/dL — ABNORMAL HIGH (ref 0.61–1.24)
GFR, Estimated: 52 mL/min — ABNORMAL LOW
Glucose, Bld: 138 mg/dL — ABNORMAL HIGH (ref 70–99)
Potassium: 3.6 mmol/L (ref 3.5–5.1)
Sodium: 140 mmol/L (ref 135–145)

## 2024-06-20 LAB — GLUCOSE, CAPILLARY
Glucose-Capillary: 120 mg/dL — ABNORMAL HIGH (ref 70–99)
Glucose-Capillary: 154 mg/dL — ABNORMAL HIGH (ref 70–99)
Glucose-Capillary: 81 mg/dL (ref 70–99)
Glucose-Capillary: 138 mg/dL — ABNORMAL HIGH (ref 70–99)

## 2024-06-20 MED ORDER — LACTATED RINGERS IV SOLN: INTRAVENOUS | Status: DC

## 2024-06-20 MED ORDER — QUETIAPINE FUMARATE 25 MG PO TABS
50.0000 mg | ORAL_TABLET | ORAL | Status: DC
  Administered 2024-06-20 – 2024-06-27 (×8): 50 mg via ORAL
  Filled 2024-06-20 (×8): qty 2

## 2024-06-20 MED ORDER — HYDRALAZINE HCL 50 MG PO TABS
50.0000 mg | ORAL_TABLET | Freq: Three times a day (TID) | ORAL | Status: DC
  Administered 2024-06-20 – 2024-06-28 (×22): 50 mg via ORAL
  Filled 2024-06-20 (×23): qty 1

## 2024-06-20 MED ORDER — OXYCODONE HCL 5 MG PO TABS
5.0000 mg | ORAL_TABLET | ORAL | Status: DC | PRN
  Administered 2024-06-20 – 2024-06-22 (×4): 5 mg via ORAL
  Filled 2024-06-20 (×4): qty 1

## 2024-06-20 MED ORDER — HYDRALAZINE HCL 20 MG/ML IJ SOLN
10.0000 mg | Freq: Once | INTRAMUSCULAR | Status: AC
  Administered 2024-06-20: 10 mg via INTRAVENOUS
  Filled 2024-06-20: qty 1

## 2024-06-20 NOTE — Evaluation (Signed)
 Clinical/Bedside Swallow Evaluation Patient Details  Name: [name] MRN: [id number] Date of Birth: 05-29-1928  Today's Date: 06/20/2024 Time: SLP Start Time (ACUTE ONLY): 1102 SLP Stop Time (ACUTE ONLY): 1118 SLP Time Calculation (min) (ACUTE ONLY): 16 min  Past Medical History:  Past Medical History:  Diagnosis Date   BPH (benign prostatic hypertrophy)    Depression    Diverticulosis of colon    GERD (gastroesophageal reflux disease)    Gilbert's syndrome    History of kidney stones    HLD (hyperlipidemia)    HOH (hard of hearing)    refuses to wear his Hearing Aid   Hypertension    Inguinal hernia    right   Internal hemorrhoid    Normal coronary arteries    a. Cath 07/2010: normal coronaries. Felt to have noncardiac CP/SOB at that time possibly r/t anxiety.   RA (rheumatoid arthritis) (HCC)    bilateral hands/ wrist--  seronegative   S/P TAVR (transcatheter aortic valve replacement) 03/27/2018   23 mm Edwards Sapien 3 transcatheter heart valve placed via percutaneous right transfemoral approach    Severe aortic stenosis    Thyroid  nodule    noted 11-2009   Wears dentures    Past Surgical History:  Past Surgical History:  Procedure Laterality Date   CARDIAC CATHETERIZATION  07-19-2010  dr wilbert turner   normal coronary arteries,  ef 60%,  moderate aortic stenosis- gradient , normal right heart pressure   CARDIOVASCULAR STRESS TEST  05/ 2011   dr jeffrie   normal low risk perfusion study   CATARACT EXTRACTION W/ INTRAOCULAR LENS  IMPLANT, BILATERAL  2013   CATARACT EXTRACTION, BILATERAL     CHOLECYSTECTOMY N/A 10/11/2018   Procedure: LAPAROSCOPIC CHOLECYSTECTOMY WITH INTRAOPERATIVE CHOLANGIOGRAM;  Surgeon: Curvin Deward MOULD, MD;  Location: [facility] OR;  Service: General;  Laterality: N/A;   COLONOSCOPY  2010  approx   ERCP N/A 09/25/2018   Procedure: ENDOSCOPIC RETROGRADE CHOLANGIOPANCREATOGRAPHY (ERCP);  Surgeon: Rosalie Kitchens, MD;  Location: [facility] ENDOSCOPY;  Service:  Endoscopy;  Laterality: N/A;   INGUINAL HERNIA REPAIR Right 08/23/2018   Procedure: OPEN RIGHT INGUINAL HERNIA REPAIR WITH MESH;  Surgeon: Eletha Boas, MD;  Location: [facility] ORS;  Service: General;  Laterality: Right;   INTRAOPERATIVE TRANSTHORACIC ECHOCARDIOGRAM N/A 03/27/2018   Procedure: INTRAOPERATIVE TRANSTHORACIC ECHOCARDIOGRAM;  Surgeon: Verlin Lonni BIRCH, MD;  Location: [facility] OR;  Service: Open Heart Surgery;  Laterality: N/A;   REMOVAL OF STONES  09/25/2018   Procedure: REMOVAL OF STONES;  Surgeon: Rosalie Kitchens, MD;  Location: [facility] ENDOSCOPY;  Service: Endoscopy;;   RIGHT/LEFT HEART CATH AND CORONARY ANGIOGRAPHY N/A 02/06/2018   Procedure: RIGHT/LEFT HEART CATH AND CORONARY ANGIOGRAPHY;  Surgeon: Verlin Lonni BIRCH, MD;  Location: [facility] INVASIVE CV LAB;  Service: Cardiovascular;  Laterality: N/A;   SPHINCTEROTOMY  09/25/2018   Procedure: SPHINCTEROTOMY;  Surgeon: Rosalie Kitchens, MD;  Location: [facility] ENDOSCOPY;  Service: Endoscopy;;   TEE WITHOUT CARDIOVERSION N/A 12/24/2021   Procedure: TRANSESOPHAGEAL ECHOCARDIOGRAM (TEE);  Surgeon: Alvan Ronal BRAVO, MD;  Location: [facility] ENDOSCOPY;  Service: Cardiovascular;  Laterality: N/A;   THYROID  LOBECTOMY Right 05/05/2015   Procedure: RIGHT THYROID  LOBECTOMY;  Surgeon: Boas Eletha, MD;  Location: [facility] OR;  Service: General;  Laterality: Right;   TRANSANAL HEMORRHOIDAL DEARTERIALIZATION N/A 04/09/2014   Procedure: TRANSANAL HEMORRHOIDAL DEARTERIALIZATION OF INTERNAL HEMORROIDS;  Surgeon: Bernarda Ned, MD;  Location: [facility];  Service: General;  Laterality: N/A;   TRANSCATHETER AORTIC VALVE REPLACEMENT, TRANSFEMORAL  03/27/2018   TRANSCATHETER AORTIC VALVE  REPLACEMENT, TRANSFEMORAL N/A 03/27/2018   Procedure: TRANSCATHETER AORTIC VALVE REPLACEMENT, TRANSFEMORAL. Edwards SAPIEN 3 Transcatheter Heart Valve 23mm.;  Surgeon: Verlin Lonni BIRCH, MD;  Location: [facility] OR;  Service: Open Heart Surgery;  Laterality: N/A;   TRANSTHORACIC ECHOCARDIOGRAM   11-26-2013   moderate focal basal and mild LVH/  ef 65-70%/  grade I diastolic dysfunction/ mild LAE/  moderate calcification with stenosis AV with mild regurg,  gradients 35 abd 58 mmHg /  mild TR   HPI:  The patient is a 89 year old gentleman who presented to the emergency room 06/18/24 with  diarrhea and weakness, and difficulty walking after an apparant mechanical fall. Found to have L ankle fx and admitted due to acute renal failure. Clinical swallow eval in 2023 appeared to be functional with regular solids and thin liquids recommended. 08/29/16 MBS: primary esophageal dysphagia; functional oropharyngeal swallow (sudden coughing after swallowing not correlated with aspiration). PMH includes: GERD, thyroid  nodule, wears dentures, hard of hearing    Assessment / Plan / Recommendation  Clinical Impression  Clinical swallow eval is somewhat limited by pt's lethargy, having recently received pain medications. He does exhibit some intermittent signs of possible oral dysphagia as well as a single cough to be monitored for possible reduced airway protection. Will reinstate previous diet and f/u for further assessment when pt is more alert and can be better challenged. Supervision recommended in the interim as pt has the potential to fluctuate dependent on his mentation.  Video interpreter Zell [phone]) was utilized for this evaluation, although private caregiver also present and assisting with language barrier. He requires cues to maintain level of alertness but when alert, takes POs readily. He has anterior loss medially around the straw on initial sips of thin liquids, not observed again across additional trials. He coughed x1 with thin liquids when allowed to drink more impulsively, but no further coughing was observed when given assistance to slow his pacing. His caregiver says that he can be impulsive with PO intake at home as well and that this will sometimes lead to coughing. Note that on previous MBS,  coughing was not associated with aspiration, although this testing was back in 2018. Initial imaging on 1/6 (CT abdomen pelvis) showed no focal consolidation in his lung bases; CXR ordered today and still pending.   SLP Visit Diagnosis: Dysphagia, unspecified (R13.10)      Swallow Evaluation Recommendations Recommendations: PO diet PO Diet Recommendation: Regular;Thin liquids (Level 0) Liquid Administration via: Straw Medication Administration: Whole meds with liquid Supervision: Staff to assist with self-feeding;Full supervision/cueing for swallowing strategies Swallowing strategies  : Minimize environmental distractions;Slow rate;Small bites/sips (help him to get one sip at a time) Postural changes: Position pt fully upright for meals;Stay upright 30-60 min after meals Oral care recommendations: Oral care BID (2x/day)   Assistance Recommended at Discharge    Functional Status Assessment Patient has had a recent decline in their functional status and demonstrates the ability to make significant improvements in function in a reasonable and predictable amount of time.  Frequency and Duration min 2x/week  2 weeks       Prognosis Prognosis for improved oropharyngeal function: Good Barriers to Reach Goals: Cognitive deficits      Swallow Study   General HPI: The patient is a 89 year old gentleman who presented to the emergency room 06/18/24 with  diarrhea and weakness, and difficulty walking after an apparant mechanical fall. Found to have L ankle fx and admitted due to acute renal failure. Clinical swallow eval in 2023  appeared to be functional with regular solids and thin liquids recommended. 08/29/16 MBS: primary esophageal dysphagia; functional oropharyngeal swallow (sudden coughing after swallowing not correlated with aspiration). PMH includes: GERD, thyroid  nodule, wears dentures, hard of hearing Type of Study: Bedside Swallow Evaluation Previous Swallow Assessment: see HPI Diet Prior to  this Study: NPO Temperature Spikes Noted: No Respiratory Status: Room air History of Recent Intubation: No Behavior/Cognition: Lethargic/Drowsy;Cooperative;Requires cueing Oral Cavity Assessment: Within Functional Limits Oral Care Completed by SLP: No Oral Cavity - Dentition: Adequate natural dentition Self-Feeding Abilities: Total assist Patient Positioning: Upright in bed Baseline Vocal Quality: Normal    Oral/Motor/Sensory Function Overall Oral Motor/Sensory Function:  (deferred in light of lethargy and language barrier, focused on POs - will need to be assessed on subsequent visit)   Ice Chips Ice chips: Not tested   Thin Liquid Thin Liquid: Impaired Presentation: Straw Oral Phase Functional Implications: Other (comment) (anterior loss) Pharyngeal  Phase Impairments: Cough - Immediate (x1)    Nectar Thick Nectar Thick Liquid: Not tested   Honey Thick Honey Thick Liquid: Not tested   Puree Puree: Within functional limits Presentation: Spoon   Solid     Solid: Within functional limits      Leita SAILOR., M.A. CCC-SLP Acute Rehabilitation Services Office: [phone]  Secure chat preferred  06/20/2024,11:57 AM

## 2024-06-20 NOTE — Progress Notes (Signed)
" ° ° ° °  Patient Name: [name]           DOB: April 05, 1928  MRN: [id number]      Admission Date: 06/18/2024  Attending Provider: Vernon Ranks, MD  Primary Diagnosis: ARF (acute renal failure)   Level of care: Telemetry   OVERNIGHT EVENT   HPI/ Events of Note [name], 89 y.o. male, was admitted on 06/18/2024 for ARF (acute renal failure).   Agitated, restless, and confused.   PMHx of dementia Oriented to self only, difficult to communicate with given language barrier.   Family reports history of hallucinations when given Ativan .  He received Haldol  earlier tonight which also triggered some hallucinations.  Sleep aid given without effectiveness. Unfortunately, he is not following commands despite multiple attempts at redirection by staff.  Safety sitter has been recommended,  however not available at this time.    Plan: Will trial IM zyprexa  for agitation.  Continue melatonin for sleeping     Lavanda Horns, DNP, ACNPC- AG [facility] [place]       "

## 2024-06-20 NOTE — Discharge Instructions (Signed)
 Do not put any weight on your left lower extremity (left ankle) at all. Keep your left ankle splint clean and dry.

## 2024-06-20 NOTE — TOC Initial Note (Signed)
 Transition of Care [facility]) - Initial/Assessment Note    Patient Details  Name: [name] MRN: [id number] Date of Birth: 06-Apr-1928  Transition of Care [facility]) CM/SW Contact:    Heather DELENA Saltness, LCSW Phone Number: 06/20/2024, 2:49 PM  Clinical Narrative:                Pt admitted to the hospital due to increase in weakness, diarrhea, and mechanical fall resulting to trimalleolar ankle fracture of his left ankle. Pt has history of dementia, alert and oriented x1, confused. Pt speaks russian. CSW met with pt and daughter, [name], at bedside to discuss discharge planning. Pt asleep during discussion. Pt's daughter speaks english. CSW advised of PT's recommendation for SNF rehab. Daughter reports pt having went to [facility] in 2018, requesting for pt to return. CSW advised of CIR needing higher LOC recommendation. Daughter adamant, stating He needs to go back to [facility]. PT needs to re-evaluate and recommend CIR. He was there before and did great. Pt currently receives [facility] PT services through [facility]. MD and care team made aware. TOC will continue to follow.  Expected Discharge Plan: Home w Home Health Services Barriers to Discharge: Continued Medical Work up   Patient Goals and CMS Choice Patient states their goals for this hospitalization and ongoing recovery are:: To return home or go to [facility] CMS Medicare.gov Compare Post Acute Care list provided to:: Patient Represenative (must comment) Choice offered to / list presented to : Adult Children [place] ownership interest in [facility].provided to:: Adult Children    Expected Discharge Plan and Services In-house Referral: Clinical Social Work Discharge Planning Services: NA Post Acute Care Choice: Resumption of Svcs/PTA Provider, IP Rehab Living arrangements for the past 2 months: Single Family Home                 DME Arranged: N/A DME Agency: NA       HH Arranged: NA HH Agency: NA        Prior Living  Arrangements/Services Living arrangements for the past 2 months: Single Family Home Lives with:: Self, Adult Children, Relatives Patient language and need for interpreter reviewed:: Yes Do you feel safe going back to the place where you live?: Yes      Need for Family Participation in Patient Care: Yes (Comment) Care giver support system in place?: Yes (comment) Current home services: DME, Home PT Criminal Activity/Legal Involvement Pertinent to Current Situation/Hospitalization: No - Comment as needed  Activities of Daily Living   ADL Screening (condition at time of admission) Independently performs ADLs?: No Does the patient have a NEW difficulty with bathing/dressing/toileting/self-feeding that is expected to last >3 days?: Yes (Initiates electronic notice to provider for possible OT consult) Does the patient have a NEW difficulty with getting in/out of bed, walking, or climbing stairs that is expected to last >3 days?: Yes (Initiates electronic notice to provider for possible PT consult) Does the patient have a NEW difficulty with communication that is expected to last >3 days?: No Is the patient deaf or have difficulty hearing?: Yes Does the patient have difficulty seeing, even when wearing glasses/contacts?: Yes Does the patient have difficulty concentrating, remembering, or making decisions?: Yes  Permission Sought/Granted Permission sought to share information with : Case Manager, Family Supports Permission granted to share information with : Yes, Verbal Permission Granted  Share Information with NAME: [name]  Permission granted to share info w AGENCY: [facility]  Permission granted to share info w  Relationship: Daughter  Permission granted to share info w Contact Information: [phone]  Emotional Assessment Appearance:: Appears stated age Attitude/Demeanor/Rapport: Sedated Affect (typically observed): Stable Orientation: : Oriented to Self, Oriented to Place,  Oriented to  Time, Oriented to Situation Alcohol / Substance Use: Not Applicable Psych Involvement: No (comment)  Admission diagnosis:  Dehydration [E86.0] Diverticulitis [K57.92] AKI (acute kidney injury) [N17.9] Prosthetic valve dysfunction, initial encounter [T82.09XA] Patient Active Problem List   Diagnosis Date Noted   ARF (acute renal failure) 06/19/2024   History of stroke 06/19/2024   Closed left ankle fracture 06/19/2024   Closed nondisplaced trimalleolar fracture of left lower leg 06/19/2024   Diverticulitis 06/18/2024   Pain due to onychomycosis of toenails of both feet 07/21/2022   Dermatitis 07/21/2022   Psoriatic arthritis of multiple joints (HCC) 04/03/2022   Palliative care encounter 02/20/2022   Difficulty swallowing pills 02/20/2022   CVA (cerebral vascular accident) (HCC) 12/25/2021   Symptomatic severe aortic stenosis with normal ejection fraction 12/21/2021   Near syncope 12/20/2021   Dementia without behavioral disturbance, psychotic disturbance, mood disturbance, or anxiety (HCC) 12/20/2021   Unspecified cirrhosis of liver (HCC) 12/20/2021   Hyperbilirubinemia 12/20/2021   Syncope 10/04/2019   Abdominal pain 10/09/2018   CKD (chronic kidney disease), stage II 10/09/2018   RUQ pain    Elevated LFTs 09/22/2018   Inguinal hernia of right side without obstruction or gangrene 08/22/2018   Chronic diastolic CHF (congestive heart failure) (HCC) 07/24/2018   Left-sided carotid artery disease 07/24/2018   Chest pain 06/05/2018   S/P TAVR (transcatheter aortic valve replacement) 03/27/2018   RA (rheumatoid arthritis) (HCC)    HOH (hard of hearing)    GERD (gastroesophageal reflux disease)    Benign prostatic hyperplasia    HLD (hyperlipidemia)    Depression    Diverticulosis 02/07/2018   Severe aortic stenosis    Benign essential HTN    Musculoskeletal chest pain 08/20/2016   Neoplasm of uncertain behavior of thyroid  gland, 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 [place] [address] Phone: [phone] Fax: [phone]  [facility] - [place]  - [place], [place] - [address] [place] [address] Phone: [phone] Fax: [phone]     Social Drivers of Health (SDOH) Social History: SDOH Screenings   Food Insecurity: No Food Insecurity (06/19/2024)  Housing: Low Risk (06/19/2024)  Transportation Needs: No Transportation Needs (06/19/2024)  Utilities: Not At Risk (06/19/2024)  Social Connections: Patient Unable To Answer (06/19/2024)  Tobacco Use: Medium Risk (06/19/2024)   SDOH Interventions: None     Readmission Risk Interventions    06/20/2024    2:21 PM  Readmission Risk Prevention Plan  Transportation Screening Complete  PCP or Specialist Appt within 5-7 Days Complete  Home Care Screening Complete  Medication Review (RN CM) Complete   Signed: Heather Saltness, MSW, LCSW Clinical Social Worker Inpatient Care Management 06/20/2024 3:04 PM

## 2024-06-20 NOTE — Progress Notes (Signed)
 " PROGRESS NOTE    [name]  FMW:[id number] DOB: July 13, 1927 DOA: 06/18/2024 PCP: Rexanne Ingle, MD   Brief Narrative:  HPI: [name] is a 89 y.o. male with history of bioprosthetic aortic valve replacement status post TAVR, history of CVA on Eliquis , hypertension, BPH, rheumatoid arthritis, psoriasis on Cosentyx, liver cirrhosis was brought to the ER after patient was found to be weak and had difficulty walking.  Per daughter patient has had some diarrhea and was walking to the bathroom when he had a fall following which he was finding difficult to ambulate.  Since patient had persistent symptoms he was brought to the ER.   ED Course: In the ER patient's labs show creatinine of 1.4 which increased from normal in 2023.  WBC 13 CT scan of the abdomen pelvis shows possible developing sigmoid diverticulitis.  Exam patient also has bruising on his knees x-rays of the right knee did not show any fracture.  Left ankle area looked inflamed and tender and CT scan shows acute fracture.  EKG shows normal sinus rhythm patient was started on fluids antibiotics for acute renal failure and diverticulitis.  Admitted for further observation.  Assessment & Plan:   Principal Problem:   ARF (acute renal failure) Active Problems:   Benign essential HTN   S/P TAVR (transcatheter aortic valve replacement)   RA (rheumatoid arthritis) (HCC)   Dementia without behavioral disturbance, psychotic disturbance, mood disturbance, or anxiety (HCC)   Unspecified cirrhosis of liver (HCC)   Psoriatic arthritis of multiple joints (HCC)   Diverticulitis   History of stroke   Closed left ankle fracture   Closed nondisplaced trimalleolar fracture of left lower leg  Acute renal failure: Likely from from recent diarrhea and poor oral intake.  Baseline creatinine around 1, presented with 1.48 which is slightly improved, resume IV fluids for another 1 day.  Diarrhea and acute sigmoid diverticulitis: CT abdomen confirms  early stages of diverticulitis, patient on Rocephin  and Flagyl .  Tolerating regular diet.  C. difficile and GI pathogen panel pending.  No more reports of diarrhea.  Left ankle fracture after recent fall will consult orthopedics.  Orthopedics consulted, per Dr. Vernetta nonoperative management and they will be ordering splints.  Seen by PT OT, SNF recommended.  TOC consulted.  Possible aspiration pneumonia: I noted that patient appeared to have hard time controlling his oral secretions, that makes me concerned about possible problem with the speech and possible aspiration pneumonia as he was also noted to have rhonchi bilaterally.  I have kept the patient n.p.o., consulted SLP, obtaining chest x-ray.  Hypertension since patient has acute renal failure and mild hyperkalemia hold ARB.  Currently on scheduled hydralazine  25 3 times daily, still blood pressure elevated, will increase to 50 mg 3 times daily.  Blood pressure only slightly elevated.  History of bioprosthetic aortic valve replacement status post TAVR and is on Eliquis  last EF measured was more than 75% on May 2025.  History of stroke on Eliquis .  History of psoriasis and rheumatoid arthritis on Cosentyx.  Previously used to be on methotrexate  and as per daughter was discontinued due to history of liver cirrhosis.  History liver cirrhosis no acute issues at this time.  History of BPH on Avodart .  Hyperglycemia/type 2 diabetes mellitus: Presented with hyperglycemia, hemoglobin A1c shows 7.0.  No previous history of diabetes.  Started on SSI for now.  Would like to discharge on metformin.    History of dementia/now with delirium: Overnight patient became agitated and delirious,  required doses of Zyprexa  and Haldol .  Patient is oriented this morning.  Will start on Seroquel  nightly to help with delirium in the evening time.  As needed Haldol .  1. Avoid benzodiazepines, antihistamines, anticholinergics, and minimize opiate use as these may  worsen delirium. 2. Assess, prevent and manage pain as lack of treatment can result in delirium.  Provide appropriate lighting and clear signage; a clock and calendar should be easily visible to the patient. 4: Monitor environmental factors. Reduce light and noise at night (close shades, turn off lights, turn off TV, ect). Correct any alterations in sleep cycle. 5: Reorient the patient to person, place, time and situation on each encounter.  6: Correct sensory deficits if possible (replace eye glasses, hearing aids, ect). 7: Avoid restraints if able. Severely delirious patients benefit from constant observation by a sitter.   DVT prophylaxis:    Code Status: Limited: Do not attempt resuscitation (DNR) -DNR-LIMITED -Do Not Intubate/DNI   Family Communication: Sitter present at the bedside.  Status is: Inpatient Remains inpatient appropriate because: Patient needs SNF at discharge however currently medically not stable, needs further inpatient management.     Estimated body mass index is 31.61 kg/m as calculated from the following:   Height as of 02/07/24: 5' 2.6 (1.59 m).   Weight as of 02/07/24: 79.9 kg.    Nutritional Assessment: There is no height or weight on file to calculate BMI.. Seen by dietician.  I agree with the assessment and plan as outlined below: Nutrition Status:        . Skin Assessment: I have examined the patient's skin and I agree with the wound assessment as performed by the wound care RN as outlined below:    Consultants:  Orthopedics-signed off  Procedures:  None  Antimicrobials:  Anti-infectives (From admission, onward)    Start     Dose/Rate Route Frequency Ordered Stop   06/19/24 2100  cefTRIAXone  (ROCEPHIN ) 2 g in sodium chloride  0.9 % 100 mL IVPB        2 g 200 mL/hr over 30 Minutes Intravenous Every 24 hours 06/19/24 0019     06/19/24 1000  metroNIDAZOLE  (FLAGYL ) IVPB 500 mg        500 mg 100 mL/hr over 60 Minutes Intravenous Every 12  hours 06/19/24 0019     06/18/24 2115  cefTRIAXone  (ROCEPHIN ) 2 g in sodium chloride  0.9 % 100 mL IVPB       Placed in And Linked Group   2 g 200 mL/hr over 30 Minutes Intravenous  Once 06/18/24 2102 06/18/24 2153   06/18/24 2115  metroNIDAZOLE  (FLAGYL ) IVPB 500 mg       Placed in And Linked Group   500 mg 100 mL/hr over 60 Minutes Intravenous  Once 06/18/24 2102 06/18/24 2257         Subjective: Patient seen and examined, used video interpreter.  Patient tells me that he was feeling well.  He denied any shortness of breath or any other complaint.  He was fully oriented.  Objective: Vitals:   06/20/24 0230 06/20/24 0459 06/20/24 0504 06/20/24 0556  BP: (!) 180/61 (!) 185/60 (!) 180/66 (!) 163/55  Pulse: 69 65 65 70  Resp: 19 19  16   Temp: 98.8 F (37.1 C) 98.1 F (36.7 C)    TempSrc: Axillary     SpO2: 97% 100%      Intake/Output Summary (Last 24 hours) at 06/20/2024 0801 Last data filed at 06/20/2024 9391 Gross per 24 hour  Intake  1410.43 ml  Output --  Net 1410.43 ml   There were no vitals filed for this visit.  Examination:  General exam: Appears acutely sick and deconditioned.  Obese Respiratory system: Audible signs of oral secretions, bilateral rhonchi. Cardiovascular system: S1 & S2 heard, RRR. No JVD, murmurs, rubs, gallops or clicks. No pedal edema. Gastrointestinal system: Abdomen is nondistended, soft and nontender. No organomegaly or masses felt. Normal bowel sounds heard. Central nervous system: Alert and oriented. No focal neurological deficits. Extremities: Dressing in the left lower extremity. Skin: No rashes, lesions or ulcers.    Data Reviewed: I have personally reviewed following labs and imaging studies  CBC: Recent Labs  Lab 06/18/24 1619 06/19/24 0215  WBC 13.0* 8.8  NEUTROABS 11.2*  --   HGB 13.1 13.0  HCT 41.7 40.4  MCV 89.9 88.6  PLT 151 154   Basic Metabolic Panel: Recent Labs  Lab 06/18/24 1619 06/19/24 0215 06/20/24 0638   NA 138 140 140  K 5.2* 4.2 3.6  CL 102 105 103  CO2 23 24 23   GLUCOSE 180* 167* 138*  BUN 16 19 14   CREATININE 1.48* 1.46* 1.28*  CALCIUM  9.2 8.9 9.6   GFR: CrCl cannot be calculated (Unknown ideal weight.). Liver Function Tests: Recent Labs  Lab 06/18/24 1619 06/19/24 0215  AST 51* 60*  ALT 38 38  ALKPHOS 74 76  BILITOT 1.2 1.0  PROT 7.4 7.0  ALBUMIN 4.0 3.9   Recent Labs  Lab 06/18/24 1619  LIPASE 40   No results for input(s): AMMONIA in the last 168 hours. Coagulation Profile: No results for input(s): INR, PROTIME in the last 168 hours. Cardiac Enzymes: No results for input(s): CKTOTAL, CKMB, CKMBINDEX, TROPONINI in the last 168 hours. BNP (last 3 results) No results for input(s): PROBNP in the last 8760 hours. HbA1C: Recent Labs    06/19/24 0215  HGBA1C 7.0*   CBG: Recent Labs  Lab 06/19/24 1208 06/19/24 1427 06/19/24 1759 06/19/24 2146 06/20/24 0712  GLUCAP 181* 184* 117* 172* 120*   Lipid Profile: No results for input(s): CHOL, HDL, LDLCALC, TRIG, CHOLHDL, LDLDIRECT in the last 72 hours. Thyroid  Function Tests: No results for input(s): TSH, T4TOTAL, FREET4, T3FREE, THYROIDAB in the last 72 hours. Anemia Panel: No results for input(s): VITAMINB12, FOLATE, FERRITIN, TIBC, IRON, RETICCTPCT in the last 72 hours. Sepsis Labs: No results for input(s): PROCALCITON, LATICACIDVEN in the last 168 hours.  No results found for this or any previous visit (from the past 240 hours).   Radiology Studies: CT ANKLE LEFT WO CONTRAST Result Date: 06/19/2024 EXAM: CT LEFT ANKLE, WITHOUT IV CONTRAST 06/19/2024 03:01:05 AM TECHNIQUE: Axial images were acquired through the left ankle without IV contrast. Reformatted images were reviewed. Automated exposure control, iterative reconstruction, and/or weight based adjustment of the mA/kV was utilized to reduce the radiation dose to as low as reasonably achievable.  COMPARISON: None. CLINICAL HISTORY: pain FINDINGS: BONES: There is an acute closed trimalleolar left ankle fracture. There is mild osteopenia. In the fibula, there is a comminuted distal shaft fracture along the syndesmosis above the level of the tibial plafond with a dorsal butterfly fragment and a few tiny comminuted fragments laterally at the fracture margin. The main distal fragment is displaced anteriorly and laterally by 1 cortex width. There is a nondisplaced transverse oblique fracture of the distal lateral malleolus, and a nondisplaced transverse fracture of the medial malleolus. An additional more or less curvilinear fracture extends from the lateral aspect of the tibial plafond into  the posterior malleolar area, and there is an impaction injury to the posteromedial distal tibial metaphysis with a few small chip fracture fragments above the level of the malleoli. There is a tiny bipartite accessory navicular bone. There are no further fractures. There are mild background degenerative changes with trace spurring of the medial malleolus and the talonavicular joint, and small noninflammatory dorsal and plantar calcaneal spurs. JOINTS: No dislocation. There is mild narrowing of the medial mortise and mild widening of the lateral mortise consistent with ligamentous trauma. Trace spurring of the talonavicular joint. SOFT TISSUES: There is moderate diffuse soft tissue edema. No superficial or deep space hematoma is seen. The area tendons grossly intact. The area ligaments are not well seen with CT. There are calcifications in the anterior tibial and peroneal arteries, to a lesser extent in the posterior tibial artery. IMPRESSION: 1. Acute closed trimalleolar left ankle fracture with comminuted distal fibular shaft fracture (displaced anteriorly and laterally by 1 cortex width), nondisplaced distal lateral malleolus fracture, nondisplaced medial malleolus fracture, and a curvilinear fracture extending from the  lateral tibial plafond to the posterior malleolus . 2. Mild narrowing of the medial mortise and mild widening of the lateral mortise, consistent with ligamentous trauma. 3. Moderate diffuse soft tissue edema without superficial or deep space hematoma. 4. Calcific arteriosclerosis. Electronically signed by: Francis Quam MD 06/19/2024 03:30 AM EST RP Workstation: [id number]   CT HEAD WO CONTRAST ( ) Result Date: 06/19/2024 EXAM: CT HEAD WITHOUT CONTRAST 06/19/2024 01:51:26 AM TECHNIQUE: CT of the head was performed without the administration of intravenous contrast. Automated exposure control, iterative reconstruction, and/or weight based adjustment of the mA/kV was utilized to reduce the radiation dose to as low as reasonably achievable. COMPARISON: 12/20/2021 CLINICAL HISTORY: fall FINDINGS: BRAIN AND VENTRICLES: No acute hemorrhage. No evidence of acute infarct. No hydrocephalus. No extra-axial collection. No mass effect or midline shift. Atrophy and chronic small vessel disease throughout the deep white matter. ORBITS: No acute abnormality. SINUSES: No acute abnormality. SOFT TISSUES AND SKULL: No acute soft tissue abnormality. No skull fracture. IMPRESSION: 1. No acute intracranial abnormality. Electronically signed by: Franky Crease MD 06/19/2024 02:00 AM EST RP Workstation: [id number]   CT ABDOMEN PELVIS W CONTRAST Result Date: 06/18/2024 EXAM: CT ABDOMEN AND PELVIS WITH CONTRAST 06/18/2024 07:07:45 PM TECHNIQUE: CT of the abdomen and pelvis was performed with the administration of 80 mL of iohexol  (OMNIPAQUE ) 300 MG/ML solution. Multiplanar reformatted images are provided for review. Automated exposure control, iterative reconstruction, and/or weight-based adjustment of the mA/kV was utilized to reduce the radiation dose to as low as reasonably achievable. COMPARISON: 12/24/2021 CLINICAL HISTORY: Abdominal pain, acute, nonlocalized. Weakness, decreased appetite, and diarrhea over the past 16 hours.  FINDINGS: LOWER CHEST: Cardiac valve prosthesis. Motion artifact in the lung bases. No focal consolidation. Right lower lung nodule centrally measuring 6 mm in diameter. No change since prior study. LIVER: Diffuse fatty infiltration of the liver. Scattered calcifications in the liver, likely granulomas. GALLBLADDER AND BILE DUCTS: Gallbladder surgically absent. Pneumobilia is likely postoperative. No biliary ductal dilatation. SPLEEN: Spleen is normal. PANCREAS: Pancreas is normal. ADRENAL GLANDS: Adrenal glands are normal. KIDNEYS, URETERS AND BLADDER: Multiple bilateral renal cysts, largest on the right measuring 3.5 cm in diameter. No change since prior study. No imaging follow-up is indicated. No stones in the kidneys or ureters. No hydronephrosis or hydroureter. No perinephric or periureteral stranding. The bladder is normal. GI AND BOWEL: Stomach, small bowel, and colon are not abnormally distended. No wall thickening or inflammatory  stranding. Diverticulosis of the sigmoid colon. Minimal pericolonic infiltration may represent early changes of acute diverticulitis. The appendix is normal. There is no bowel obstruction. PERITONEUM AND RETROPERITONEUM: No ascites. No free air. VASCULATURE: Aorta is normal in caliber. Calcification of the aorta. No aneurysm. LYMPH NODES: No lymphadenopathy. REPRODUCTIVE ORGANS: The prostate gland is not enlarged. BONES AND SOFT TISSUES: Degenerative changes in the spine. No acute osseous abnormality. No focal soft tissue abnormality. IMPRESSION: 1. Diverticulosis of the sigmoid colon with minimal pericolonic infiltration, possibly representing early changes of acute diverticulitis. No abscess. Electronically signed by: Elsie Gravely MD 06/18/2024 07:29 PM EST RP Workstation: [id number]   DG Knee Complete 4 Views Right Result Date: 06/18/2024 EXAM: 4 VIEW(S) XRAY OF THE KNEE 06/18/2024 04:46:00 PM COMPARISON: None available. CLINICAL HISTORY: pain after fall FINDINGS: BONES  AND JOINTS: No acute fracture. No malalignment. No significant joint effusion. Minimal tricompartmental osteophyte formation consistent with osteoarthritis. SOFT TISSUES: Vascular calcifications are present. The remaining visualized soft tissues are unremarkable. IMPRESSION: 1. No acute fracture or dislocation. 2. Minimal tricompartmental osteoarthritis. Electronically signed by: Greig Pique MD 06/18/2024 05:17 PM EST RP Workstation: [id number]    Scheduled Meds:  apixaban   5 mg Oral BID   dutasteride   0.5 mg Oral Daily   escitalopram   10 mg Oral QHS   hydrALAZINE   25 mg Oral Q8H   insulin  aspart  0-5 Units Subcutaneous QHS   insulin  aspart  0-9 Units Subcutaneous TID WC   pantoprazole   40 mg Oral Daily   Continuous Infusions:  cefTRIAXone  (ROCEPHIN )  IV 2 g (06/19/24 2140)   lactated ringers      metronidazole  500 mg (06/19/24 2239)     LOS: 1 day   Fredia Skeeter, MD [facility]  06/20/2024, 8:01 AM   *Please note that this is a verbal dictation therefore any spelling or grammatical errors are due to the Dragon [employer] One system interpretation.  Please page via Amion and do not message via secure chat for urgent patient care matters. Secure chat can be used for non urgent patient care matters.  How to contact the [facility] Attending or Consulting provider 7A - 7P or covering provider during after hours 7P -7A, for this patient?  Check the care team in [facility] and look for a) attending/consulting [facility] provider listed and b) the [facility] team listed. Page or secure chat 7A-7P. Log into [email or web address] and use [place]'s universal password to access. If you do not have the password, please contact the hospital operator. Locate the [facility] provider you are looking for under [facility] and page to a number that you can be directly reached. If you still have difficulty reaching the provider, please page the [facility] (Director on Call) for the Hospitalists listed on amion for assistance.  "

## 2024-06-20 NOTE — Plan of Care (Signed)

## 2024-06-20 NOTE — Progress Notes (Signed)
 Occupational Therapy Evaluation Patient Details Name: [name] MRN: [id number] DOB: 03/11/1928 Today's Date: 06/20/2024   History of Present Illness   The patient is a 89 year old gentleman who presented to the emergency room 06/18/24 with  diarrhea and weakness, and  difficulty walking after an apparant mechanical fall.SABRA  He was then admitted due to acute renal failure.  In the emergency room he was complaining of left ankle pain and was having difficulty with mobility and weightbearing on his left ankle.  A CT scan of the left ankle was obtained and it does show a trimalleolar ankle fracture. Treated with short leg posterior splint and NWB on the LLE     Clinical Impressions The pt is currently presenting well below his baseline level of functioning for ADL management;  he is normally independent with ADLs and needed assist for IADLs. He is limited by the below listed deficits (see OT problem list). During the session today, he presented with lethargy. At current, the pt appears to require increased assist for all ADLs at bed level. Per his daughter, he has had difficulty with even self-feeding today, given increased lethargy. Based on clinical judgment, the pt requires approximately max assist for lower body dressing, mod assist for upper body dressing, and max assist for toileting management. His ADL performance is a least partly impacted, due to increased lethargy today. She daughter pleasantly asked for this OT to defer attempts at EOB or out of bed activity this date, therefore the overall evaluation was limited. OT anticipates his overall functional performance would be improved, should his lethargy decrease. OT will continue to follow him for services in the acute care setting. Patient will benefit from continued inpatient follow up therapy, <3 hours/day.     If plan is discharge home, recommend the following:   A lot of help with walking and/or transfers;A lot of help with  bathing/dressing/bathroom;Assistance with cooking/housework;Direct supervision/assist for medications management;Assist for transportation;Supervision due to cognitive status;Assistance with feeding;Direct supervision/assist for financial management;Help with stairs or ramp for entrance     Functional Status Assessment   Patient has had a recent decline in their functional status and demonstrates the ability to make significant improvements in function in a reasonable and predictable amount of time.     Equipment Recommendations   Other (comment) (defer to next setting)     Recommendations for Other Services         Precautions/Restrictions   Precautions Precautions: Fall Restrictions Weight Bearing Restrictions Per Provider Order: Yes LLE Weight Bearing Per Provider Order: Non weight bearing     Mobility Bed Mobility               General bed mobility comments: unable to assess, as the pt's daughter politely asked for this OT to defer attempts at bed mobility and out of bed activity, due to the pt being lethargic and experiencing pain this date    Transfers                   General transfer comment: unable to assess          ADL either performed or assessed with clinical judgement   ADL Overall ADL's : Needs assistance/impaired          General ADL Comments: At current, the pt requires increased assist for all ADLs at bed level. Per his daughter, he has had difficulty with even self-feeding today, given increased lethargy. Based on clinical judgment, the pt requires approximately max assist for  lower body dressing, mod assist for upper body dressing, and max assist for toileting management. His ADL performance is a least partly impacted, due to increased lethargy today.      Pertinent Vitals/Pain Pain Assessment Pain Assessment: Faces Pain Score: 3  Pain Location: L ankle Pain Intervention(s): Limited activity within patient's tolerance,  Monitored during session     Extremity/Trunk Assessment Upper Extremity Assessment Upper Extremity Assessment: Right hand dominant;LUE deficits/detail;RUE deficits/detail;Generalized weakness RUE Deficits / Details: Per his daugher, he has chronic shoulder AROM limitations. ELbow and hand AROM WFL. Grip strength 4-/5 LUE Deficits / Details: Per his daugher, he has chronic shoulder AROM limitations. ELbow and hand AROM WFL. Grip strength 4-/5   Lower Extremity Assessment Lower Extremity Assessment: Generalized weakness RLE Deficits / Details: AAROM WFL at bed level LLE Deficits / Details: short leg posterior splint       Communication Communication Factors Affecting Communication: Non - English speaking, interpreter not available (pt speaks Russian, his daughter was present during OT session and provided interpreter services)   Cognition Arousal: Lethargic Behavior During Therapy: Flat affect Cognition: Difficult to assess (due to pt lethargy)            Following commands: Impaired Following commands impaired: Follows multi-step commands inconsistently (at least partly difficult due to pt lethargy)     Cueing  General Comments   Cueing Techniques: Verbal cues;Gestural cues;Tactile cues              Home Living Family/patient expects to be discharged to:: Private residence Living Arrangements: Alone Available Help at Discharge: Family;Available PRN/intermittently;Personal care attendant Type of Home: House Home Access: Stairs to enter [employer] of Steps: 3 Entrance Stairs-Rails: Right;Left Home Layout: One level     Bathroom Shower/Tub: Walk-in shower         Home Equipment: Grab bars - tub/shower;Shower seat;Rolling Environmental Consultant (2 wheels)   Additional Comments: pt's daughter was present and provided info reagarding pt's prior level of functioning and living situation.      Prior Functioning/Environment Prior Level of Function :  Independent/Modified Independent             Mobility Comments: Pt was independent with ambulation. He refused to use a cane or walker. ADLs Comments: He was independent with ADLs. He has home health aides 7 days per week, 9 am to 8pm during the week and 5pm to 8pm on the weekends. His family or aides assisted with medication management, meal prep, and cleaning. His daughters take turns checking on him.    OT Problem List: Decreased strength;Decreased range of motion;Decreased activity tolerance;Impaired balance (sitting and/or standing);Decreased knowledge of use of DME or AE;Decreased knowledge of precautions;Pain   OT Treatment/Interventions: Self-care/ADL training;Therapeutic exercise;Energy conservation;DME and/or AE instruction;Therapeutic activities;Balance training;Patient/family education;Cognitive remediation/compensation      OT Goals(Current goals can be found in the care plan section)   Acute Rehab OT Goals OT Goal Formulation: With patient/family Time For Goal Achievement: 07/04/24 Potential to Achieve Goals:  (Guarded at current) ADL Goals Pt Will Perform Eating: with set-up;sitting Pt Will Perform Grooming: with set-up;sitting Pt Will Perform Upper Body Dressing: with set-up;with supervision;sitting Pt Will Transfer to Toilet: stand pivot transfer;bedside commode;with min assist Additional ADL Goal #1: Pt will perform bed mobility with supervision, in prep for progressive ADL participation.   OT Frequency:  Min 2X/week       AM-PAC OT 6 Clicks Daily Activity     Outcome Measure Help from another person eating meals?: A Lot Help  from another person taking care of personal grooming?: A Lot Help from another person toileting, which includes using toliet, bedpan, or urinal?: A Lot Help from another person bathing (including washing, rinsing, drying)?: A Lot Help from another person to put on and taking off regular upper body clothing?: A Lot Help from another  person to put on and taking off regular lower body clothing?: A Lot 6 Click Score: 12   End of Session Equipment Utilized During Treatment: Other (comment) (N/A) Nurse Communication: Mobility status  Activity Tolerance: Patient limited by lethargy Patient left: in bed;with call bell/phone within reach;with bed alarm set  OT Visit Diagnosis: Muscle weakness (generalized) (M62.81);History of falling (Z91.81);Other abnormalities of gait and mobility (R26.89)                Time: 8468-8446 OT Time Calculation (min): 22 min Charges:  OT General Charges $OT Visit: 1 Visit OT Evaluation $OT Eval Moderate Complexity: 1 Mod     Delanna JINNY Lesches, OTR/L 06/20/2024, 4:49 PM

## 2024-06-20 NOTE — Progress Notes (Signed)
 Patient ID: [name], male   DOB: 1928-01-16, 89 y.o.   MRN: [id number] The patient's left ankle splint is intact.  He does have a known trimalleolar ankle fracture with that left ankle that is in acceptable alignment.  The splint will need to stay on for at least the next week to 2 weeks to allow for soft tissue swelling to subside and then we will transition him to a short leg cast.  Even with surgery, he would still need to be nonweightbearing for the next 4 to 6 weeks on that left ankle.  We will see him in the office in a week to 2 weeks for repeat x-rays.

## 2024-06-21 DIAGNOSIS — N179 Acute kidney failure, unspecified: Secondary | ICD-10-CM | POA: Diagnosis not present

## 2024-06-21 LAB — GLUCOSE, CAPILLARY
Glucose-Capillary: 126 mg/dL — ABNORMAL HIGH (ref 70–99)
Glucose-Capillary: 213 mg/dL — ABNORMAL HIGH (ref 70–99)
Glucose-Capillary: 134 mg/dL — ABNORMAL HIGH (ref 70–99)
Glucose-Capillary: 155 mg/dL — ABNORMAL HIGH (ref 70–99)

## 2024-06-21 MED ORDER — METRONIDAZOLE 500 MG PO TABS
500.0000 mg | ORAL_TABLET | Freq: Two times a day (BID) | ORAL | Status: AC
  Administered 2024-06-21 – 2024-06-24 (×8): 500 mg via ORAL
  Filled 2024-06-21 (×8): qty 1

## 2024-06-21 NOTE — Progress Notes (Signed)
 Speech Language Pathology Treatment: Dysphagia  Patient Details Name: [name] MRN: [id number] DOB: 10/31/1927 Today's Date: 06/21/2024 Time: 8989-8977 SLP Time Calculation (min) (ACUTE ONLY): 12 min  Assessment / Plan / Recommendation Clinical Impression  Pt seen for ongoing dysphagia management.  Pt asleep, but able to rouse.  Pt seen with assistance of video interpreter [phone] in Russian.  Pt tolerated all consistencies trialed without any clinical s/s of aspiration.  There was thin diffuse coating of residue with solid texture.  This was reduced with liquid wash. Overnight some coughing noted with medications per nursing, and PT observed cough with water at end of session, but day nurse reports no difficulties with POs.  He is somewhat tired and had difficulty maintaining alertness during session.  Recommend ensuring pt is fully awake alert before offering POs.  CXR 1/8: No focal consolidation to suggest pneumonia.  Pt appears safe to continue current diet.  Pt has no further ST needs.  SLP will sign off.  Recommend regular texture diet with thin liquid.    HPI HPI: The patient is a 89 year old gentleman who presented to the emergency room 06/18/24 with  diarrhea and weakness, and difficulty walking after an apparant mechanical fall. Found to have L ankle fx and admitted due to acute renal failure. Clinical swallow eval in 2023 appeared to be functional with regular solids and thin liquids recommended. 08/29/16 MBS: primary esophageal dysphagia; functional oropharyngeal swallow (sudden coughing after swallowing not correlated with aspiration). PMH includes: GERD, thyroid  nodule, wears dentures, hard of hearing      SLP Plan  Discharge SLP treatment due to (comment);All goals met        Swallow Evaluation Recommendations    See above     Recommendations  Diet recommendations: Regular Liquids provided via: Cup;Straw Medication Administration:  (As  tolerated) Supervision: Intermittent supervision to cue for compensatory strategies Compensations: Slow rate;Small sips/bites (POs when fully awake/alert) Postural Changes and/or Swallow Maneuvers: Seated upright 90 degrees                  Oral care BID     Dysphagia, unspecified (R13.10)     Discharge SLP treatment due to (comment);All goals met     Anette FORBES Grippe, MA, CCC-SLP Acute Rehabilitation Services Office: [phone] 06/21/2024, 10:36 AM

## 2024-06-21 NOTE — Progress Notes (Signed)
 " PROGRESS NOTE    [name]  FMW:[id number] DOB: December 22, 1927 DOA: 06/18/2024 PCP: Rexanne Ingle, MD   Brief Narrative:   89 y.o. male with history of bioprosthetic aortic valve replacement status post TAVR, history of CVA on Eliquis , hypertension, BPH, rheumatoid arthritis, psoriasis on Cosentyx, liver cirrhosis presented with worsening weakness and difficulty walking.  On presentation, creatinine was 1.4 which had increased from normal in 2023 with WBCs of 13.  CT of the abdomen and pelvis showed possible sigmoid diverticulitis.  CT of the left ankle showed acute ankle fracture.  He was started on IV fluids and antibiotics.  Orthopedics was consulted and recommended conservative management.  Assessment & Plan:   Acute kidney injury - Likely from poor oral intake and recent diarrhea.  Presented with a creatinine of 1.48.  Creatinine improving.  Labs pending today.  Monitor.  Off IV fluids.  Acute sigmoid diverticulitis - No diarrhea since admission.  DC stool testing and isolation.  Continue Rocephin  and Flagyl .  Possible aspiration pneumonia - Pulm x-ray did not show any focal consolidation.  Diet as per SLP recommendations.  Continue current antibiotics  Left ankle fracture -Continue conservative management with splint as per orthopedics recommendations.  Outpatient follow-up with orthopedics.  Patient will need to be nonweightbearing on his left lower extremity for the next 4 to 6 weeks.  Delirium History of dementia - Patient has been started on scheduled Seroquel .  Monitor mental status.  Delirium precautions.  Awake this morning.  Hypertension - Continue hydralazine .  ARB on hold.  Monitor blood pressure  History of bioprosthetic aortic valve replacement status post TAVR - Continue Eliquis .  Outpatient follow-up with cardiology  History of unspecified stroke - Continue Eliquis   Diabetes mellitus type 2 with hyperglycemia - A1c 7.0.  Continue CBGs with SSI.  Carb modified  diet  BPH - Continue dutasteride   History of psoriasis and rheumatoid arthritis - On Cosentyx.  Used to be on methotrexate  and as per daughter, this was discontinued because of cirrhosis  History of liver cirrhosis - No active issues at this time.  Outpatient follow-up with PCP and GI  Goals of care - Overall prognosis is guarded to poor.  Consult palliative care.  Physical deconditioning - PT recommending SNF placement.  TOC following.  DVT prophylaxis: Eliquis  Code Status: DNR Family Communication: None at bedside. Called daughter [name] on phone: she didn't pickup. Called daughter Russ on phone: she didn't pickup Disposition Plan: Status is: Inpatient Remains inpatient appropriate because: Of severity of illness    Consultants: Orthopedics.  Consult palliative care  Procedures: None  Antimicrobials:  Anti-infectives (From admission, onward)    Start     Dose/Rate Route Frequency Ordered Stop   06/21/24 1000  metroNIDAZOLE  (FLAGYL ) tablet 500 mg        500 mg Oral Every 12 hours 06/21/24 0801     06/19/24 2100  cefTRIAXone  (ROCEPHIN ) 2 g in sodium chloride  0.9 % 100 mL IVPB        2 g 200 mL/hr over 30 Minutes Intravenous Every 24 hours 06/19/24 0019     06/19/24 1000  metroNIDAZOLE  (FLAGYL ) IVPB 500 mg  Status:  Discontinued        500 mg 100 mL/hr over 60 Minutes Intravenous Every 12 hours 06/19/24 0019 06/21/24 0801   06/18/24 2115  cefTRIAXone  (ROCEPHIN ) 2 g in sodium chloride  0.9 % 100 mL IVPB       Placed in And Linked Group   2 g 200 mL/hr over  30 Minutes Intravenous  Once 06/18/24 2102 06/18/24 2153   06/18/24 2115  metroNIDAZOLE  (FLAGYL ) IVPB 500 mg       Placed in And Linked Group   500 mg 100 mL/hr over 60 Minutes Intravenous  Once 06/18/24 2102 06/18/24 2257        Subjective: Patient seen and examined at bedside.  Poor historian.  No seizures, agitation or vomiting reported.  Objective: Vitals:   06/20/24 1423 06/20/24 2121 06/20/24 2200  06/21/24 0650  BP: (!) 157/55 (!) 161/59  (!) 174/61  Pulse: (!) 55 73  71  Resp: 18 (!) 24  (!) 24  Temp: 98.3 F (36.8 C) 98.8 F (37.1 C)  98.6 F (37 C)  TempSrc:      SpO2: 97% 94%  95%  Weight:   85.6 kg   Height:   5' 3 (1.6 m)     Intake/Output Summary (Last 24 hours) at 06/21/2024 1100 Last data filed at 06/21/2024 1033 Gross per 24 hour  Intake 2588.15 ml  Output 580 ml  Net 2008.15 ml   Filed Weights   06/20/24 2200  Weight: 85.6 kg    Examination:  General exam: Appears calm and comfortable.  Respiratory system: Bilateral decreased breath sounds at bases with scattered crackles and intermittent tachypnea Cardiovascular system: S1 & S2 heard, Rate controlled currently Gastrointestinal system: Abdomen is distended, soft and nontender. Normal bowel sounds heard. Extremities: No cyanosis, clubbing.  Left lower extremity dressing present Central nervous system: Awake.  Slow to respond.  Poor historian.  Slightly confused.  No focal neurological deficits. Moving extremities Skin: No rashes, lesions or ulcers Psychiatry: Flat affect.  Not agitated.     Data Reviewed: I have personally reviewed following labs and imaging studies  CBC: Recent Labs  Lab 06/18/24 1619 06/19/24 0215 06/20/24 1324  WBC 13.0* 8.8 8.1  NEUTROABS 11.2*  --  6.0  HGB 13.1 13.0 12.0*  HCT 41.7 40.4 36.5*  MCV 89.9 88.6 87.3  PLT 151 154 145*   Basic Metabolic Panel: Recent Labs  Lab 06/18/24 1619 06/19/24 0215 06/20/24 0638  NA 138 140 140  K 5.2* 4.2 3.6  CL 102 105 103  CO2 23 24 23   GLUCOSE 180* 167* 138*  BUN 16 19 14   CREATININE 1.48* 1.46* 1.28*  CALCIUM  9.2 8.9 9.6   GFR: Estimated Creatinine Clearance: 34.1 mL/min (A) (by C-G formula based on SCr of 1.28 mg/dL (H)). Liver Function Tests: Recent Labs  Lab 06/18/24 1619 06/19/24 0215  AST 51* 60*  ALT 38 38  ALKPHOS 74 76  BILITOT 1.2 1.0  PROT 7.4 7.0  ALBUMIN 4.0 3.9   Recent Labs  Lab 06/18/24 1619   LIPASE 40   No results for input(s): AMMONIA in the last 168 hours. Coagulation Profile: No results for input(s): INR, PROTIME in the last 168 hours. Cardiac Enzymes: No results for input(s): CKTOTAL, CKMB, CKMBINDEX, TROPONINI in the last 168 hours. BNP (last 3 results) No results for input(s): PROBNP in the last 8760 hours. HbA1C: Recent Labs    06/19/24 0215  HGBA1C 7.0*   CBG: Recent Labs  Lab 06/20/24 0712 06/20/24 1215 06/20/24 1725 06/20/24 2119 06/21/24 0800  GLUCAP 120* 154* 81 138* 126*   Lipid Profile: No results for input(s): CHOL, HDL, LDLCALC, TRIG, CHOLHDL, LDLDIRECT in the last 72 hours. Thyroid  Function Tests: No results for input(s): TSH, T4TOTAL, FREET4, T3FREE, THYROIDAB in the last 72 hours. Anemia Panel: No results for input(s): VITAMINB12, FOLATE, FERRITIN,  TIBC, IRON, RETICCTPCT in the last 72 hours. Sepsis Labs: No results for input(s): PROCALCITON, LATICACIDVEN in the last 168 hours.  No results found for this or any previous visit (from the past 240 hours).       Radiology Studies: DG CHEST PORT 1 VIEW Result Date: 06/20/2024 EXAM: 1 VIEW XRAY OF THE CHEST 06/20/2024 12:57:00 PM COMPARISON: None available. CLINICAL HISTORY: Pneumonia FINDINGS: LINES, TUBES AND DEVICES: Multiple EKG leads noted. LUNGS AND PLEURA: Low lung volumes. Diffuse interstitial prominence. No focal consolidation. No pleural effusion. No pneumothorax. HEART AND MEDIASTINUM: Cardiomegaly. Status post TAVR. Aortic atherosclerotic calcifications. BONES AND SOFT TISSUES: Old healed right rib fractures. IMPRESSION: 1. No focal consolidation to suggest pneumonia. 2. Low lung volumes with diffuse interstitial prominence. Electronically signed by: Norleen Boxer MD MD 06/20/2024 01:28 PM EST RP Workstation: [id number]        Scheduled Meds:  apixaban   5 mg Oral BID   dutasteride   0.5 mg Oral Daily   escitalopram   10 mg  Oral QHS   hydrALAZINE   50 mg Oral Q8H   insulin  aspart  0-5 Units Subcutaneous QHS   insulin  aspart  0-9 Units Subcutaneous TID WC   metroNIDAZOLE   500 mg Oral Q12H   pantoprazole   40 mg Oral Daily   QUEtiapine   50 mg Oral Q24H   Continuous Infusions:  cefTRIAXone  (ROCEPHIN )  IV Stopped (06/20/24 2124)          Sophie Mao, MD [facility] 06/21/2024, 11:00 AM   "

## 2024-06-21 NOTE — Progress Notes (Signed)
 Inpatient Rehab Admissions Coordinator:   Consult received and chart reviewed.  Note pt admitted from home after a fall and difficulty walking.  Noted recent diarrhea, poor oral intake.  Hospital course demonstrated AKI which is improving, L ankle fracture to be treated nonoperatively and NWB for 4-6 weeks.  He is currently limited in mobility with both PT and OT.  He was previously on CIR in 2018 after a polytrauma and discharged home at supervision level.  At the time he had Medicare A/B.  He currently has [employer] which requires prior authorization, and based on current payor data [employer] would not approve CIR for an orthopedic injury.  We will not pursue [facility] CIR at this time.    [name], PT, DPT Admissions Coordinator [phone] 06/21/2024 4:30 PM

## 2024-06-21 NOTE — Plan of Care (Signed)

## 2024-06-21 NOTE — Plan of Care (Signed)
" °  Problem: Coping: Goal: Ability to adjust to condition or change in health will improve Outcome: Progressing   Problem: Fluid Volume: Goal: Ability to maintain a balanced intake and output will improve Outcome: Progressing   Problem: Health Behavior/Discharge Planning: Goal: Ability to identify and utilize available resources and services will improve Outcome: Progressing   Problem: Metabolic: Goal: Ability to maintain appropriate glucose levels will improve Outcome: Progressing   Problem: Nutritional: Goal: Maintenance of adequate nutrition will improve Outcome: Progressing Goal: Progress toward achieving an optimal weight will improve Outcome: Progressing   Problem: Skin Integrity: Goal: Risk for impaired skin integrity will decrease Outcome: Progressing   Problem: Tissue Perfusion: Goal: Adequacy of tissue perfusion will improve Outcome: Progressing   Problem: Health Behavior/Discharge Planning: Goal: Ability to manage health-related needs will improve Outcome: Progressing   Problem: Clinical Measurements: Goal: Ability to maintain clinical measurements within normal limits will improve Outcome: Progressing Goal: Will remain free from infection Outcome: Progressing Goal: Diagnostic test results will improve Outcome: Progressing Goal: Respiratory complications will improve Outcome: Progressing Goal: Cardiovascular complication will be avoided Outcome: Progressing   Problem: Activity: Goal: Risk for activity intolerance will decrease Outcome: Progressing   Problem: Nutrition: Goal: Adequate nutrition will be maintained Outcome: Progressing   Problem: Coping: Goal: Level of anxiety will decrease Outcome: Progressing   Problem: Elimination: Goal: Will not experience complications related to bowel motility Outcome: Progressing Goal: Will not experience complications related to urinary retention Outcome: Progressing   Problem: Pain Managment: Goal:  General experience of comfort will improve and/or be controlled Outcome: Progressing   Problem: Safety: Goal: Ability to remain free from injury will improve Outcome: Progressing   Problem: Skin Integrity: Goal: Risk for impaired skin integrity will decrease Outcome: Progressing   "

## 2024-06-21 NOTE — Progress Notes (Signed)
 Physical Therapy Treatment Patient Details Name: [name] MRN: [id number] DOB: 1927-07-05 Today's Date: 06/21/2024   History of Present Illness The patient is a 89 year old gentleman who presented to the emergency room 06/18/24 with  diarrhea and weakness, and  difficulty walking after an apparant mechanical fall.SABRA  He was then admitted due to acute renal failure.  In the emergency room he was complaining of left ankle pain and was having difficulty with mobility and weightbearing on his left ankle.  A CT scan of the left ankle was obtained and it does show a trimalleolar ankle fracture. Treated with short leg posterior splint and NWB on the RLE    PT Comments  The patient is very lethargic, does not stay aroused. Interpreter utilized with patient poorly able to follow and answer any questions. +2 total assist to pivot from recliner to bed, being cautious of NWB on the LLE, Patient did stand x 1  but unable to Hop to  step, squat pivot with bed pad to bed using bed pad for support .  Patient was much more participatory on Eval , was alert and followed gestures and visual as no interpreter was available, was able to assist in bed mobility at the eval..  Patient's MS appears affected by medication at this time. Patient will benefit from intensive inpatient follow-up therapy, >3 hours/day, daughter requesting AIR as pt has been there in past. No family present and patient unable to  provide caregiver availability. Patient will be WC dependent until WBS advanced.    If plan is discharge home, recommend the following: Two people to help with walking and/or transfers;A lot of help with bathing/dressing/bathroom;Assist for transportation;Help with stairs or ramp for entrance   Can travel by private vehicle        Equipment Recommendations  Wheelchair (measurements PT);Wheelchair cushion (measurements PT)    Recommendations for Other Services Rehab consult     Precautions / Restrictions  Precautions Precautions: Fall Precaution/Restrictions Comments: non English Restrictions LLE Weight Bearing Per Provider Order: Non weight bearing     Mobility  Bed Mobility   Bed Mobility: Sit to Supine       Sit to supine: Total assist, +2 for safety/equipment, +2 for physical assistance   General bed mobility comments: assist with legs and trunk, patient catatonmic and  extremities stiff.    Transfers Overall transfer level: Needs assistance Equipment used: Rolling walker (2 wheels) Transfers: Sit to/from Stand, Bed to chair/wheelchair/BSC Sit to Stand: Total assist, +2 physical assistance, +2 safety/equipment     Squat pivot transfers: +2 physical assistance, +2 safety/equipment, Total assist     General transfer comment: Stood x 1 at RW, max assist of 2 but unable to  hop step to turn, assisted to sit down to recliner, arm dropped then  assisted to  partial stand, use of bed pad to squat pivot to bed, patient unable to assist,  LLE  at risk for [facility]  Daughter requesting AIR.     Ambulation/Gait                   Stairs             Wheelchair Mobility     Tilt Bed    Modified Rankin (Stroke Patients Only)       Balance Overall balance assessment: History of Falls Sitting-balance support: Bilateral upper extremity supported, Feet supported Sitting balance-Leahy Scale: Poor Sitting balance - Comments: posteriorly falling back   Standing balance support: Reliant on assistive  device for balance, During functional activity, Bilateral upper extremity supported Standing balance-Leahy Scale: Zero                              Communication Communication Factors Affecting Communication: Non - English speaking, interpreter not available  Cognition Arousal: Lethargic, Suspect due to medications (RN reports seroquel  laST NIGHT) Behavior During Therapy: Flat affect   PT - Cognitive impairments: Difficult to assess Difficult to assess  due to: Non-English speaking                     PT - Cognition Comments: interpreter Karena [phone] used, patient required constant stimulation to arouse, did only answer that he had pain, no number. Following commands: Impaired Following commands impaired: Follows one step commands inconsistently    Cueing Cueing Techniques: Verbal cues, Tactile cues, Visual cues (interpreter)  Exercises      General Comments        Pertinent Vitals/Pain Pain Assessment Pain Assessment: PAINAD Breathing: occasional labored breathing, short period of hyperventilation Negative Vocalization: occasional moan/groan, low speech, negative/disapproving quality Facial Expression: smiling or inexpressive Body Language: relaxed Consolability: no need to console PAINAD Score: 2    Home Living                          Prior Function            PT Goals (current goals can now be found in the care plan section) Progress towards PT goals: Not progressing toward goals - comment (very lethargic compared to 2 days ago)    Frequency    Min 2X/week      PT Plan      Co-evaluation              AM-PAC PT 6 Clicks Mobility   Outcome Measure  Help needed turning from your back to your side while in a flat bed without using bedrails?: Total Help needed moving from lying on your back to sitting on the side of a flat bed without using bedrails?: Total Help needed moving to and from a bed to a chair (including a wheelchair)?: Total Help needed standing up from a chair using your arms (e.g., wheelchair or bedside chair)?: Total Help needed to walk in hospital room?: Total Help needed climbing 3-5 steps with a railing? : Total 6 Click Score: 6    End of Session Equipment Utilized During Treatment: Gait belt Activity Tolerance: Patient limited by lethargy Patient left: in bed;with call bell/phone within reach;with bed alarm set Nurse Communication: Mobility status PT Visit  Diagnosis: Unsteadiness on feet (R26.81);Other abnormalities of gait and mobility (R26.89);Muscle weakness (generalized) (M62.81);History of falling (Z91.81);Pain Pain - part of body: Ankle and joints of foot     Time: 0950-1015 PT Time Calculation (min) (ACUTE ONLY): 25 min  Charges:    $Therapeutic Activity: 23-37 mins PT General Charges $$ ACUTE PT VISIT: 1 Visit                     Darice Potters PT Acute Rehabilitation Services Office [phone]    Potters [name] 06/21/2024, 10:24 AM

## 2024-06-21 NOTE — TOC Progression Note (Signed)
 Transition of Care [facility]) - Progression Note    Patient Details  Name: [name] MRN: [id number] Date of Birth: [birth year]  Transition of Care [facility] CM/SW Contact  Alfonse JONELLE Rex, RN Phone Number: 06/21/2024, 12:16 PM  Clinical Narrative:   PT eval completed, recommendation for Acute Inpatient Rehab, await CIR eval.     Expected Discharge Plan: Home w Home Health Services Barriers to Discharge: Continued Medical Work up               Expected Discharge Plan and Services In-house Referral: Clinical Social Work Discharge Planning Services: NA Post Acute Care Choice: Resumption of Svcs/PTA Provider, IP Rehab Living arrangements for the past 2 months: Single Family Home                 DME Arranged: N/A DME Agency: NA       HH Arranged: NA HH Agency: NA         Social Drivers of Health (SDOH) Interventions SDOH Screenings   Food Insecurity: No Food Insecurity (06/19/2024)  Housing: Low Risk (06/19/2024)  Transportation Needs: No Transportation Needs (06/19/2024)  Utilities: Not At Risk (06/19/2024)  Social Connections: Patient Unable To Answer (06/19/2024)  Tobacco Use: Medium Risk (06/19/2024)    Readmission Risk Interventions    06/20/2024    2:21 PM  Readmission Risk Prevention Plan  Transportation Screening Complete  PCP or Specialist Appt within 5-7 Days Complete  Home Care Screening Complete  Medication Review (RN CM) Complete

## 2024-06-22 DIAGNOSIS — Z515 Encounter for palliative care: Secondary | ICD-10-CM

## 2024-06-22 DIAGNOSIS — R531 Weakness: Secondary | ICD-10-CM

## 2024-06-22 DIAGNOSIS — Z7189 Other specified counseling: Secondary | ICD-10-CM | POA: Diagnosis not present

## 2024-06-22 DIAGNOSIS — N179 Acute kidney failure, unspecified: Secondary | ICD-10-CM | POA: Diagnosis not present

## 2024-06-22 LAB — CBC WITH DIFFERENTIAL/PLATELET
Abs Immature Granulocytes: 0.03 K/uL (ref 0.00–0.07)
Basophils Absolute: 0 K/uL (ref 0.0–0.1)
Basophils Relative: 1 %
Eosinophils Absolute: 0.5 K/uL (ref 0.0–0.5)
Eosinophils Relative: 7 %
HCT: 35.1 % — ABNORMAL LOW (ref 39.0–52.0)
Hemoglobin: 11.2 g/dL — ABNORMAL LOW (ref 13.0–17.0)
Immature Granulocytes: 0 %
Lymphocytes Relative: 17 %
Lymphs Abs: 1.3 K/uL (ref 0.7–4.0)
MCH: 28.1 pg (ref 26.0–34.0)
MCHC: 31.9 g/dL (ref 30.0–36.0)
MCV: 88.2 fL (ref 80.0–100.0)
Monocytes Absolute: 0.7 K/uL (ref 0.1–1.0)
Monocytes Relative: 9 %
Neutro Abs: 5.1 K/uL (ref 1.7–7.7)
Neutrophils Relative %: 66 %
Platelets: 166 K/uL (ref 150–400)
RBC: 3.98 MIL/uL — ABNORMAL LOW (ref 4.22–5.81)
RDW: 12.1 % (ref 11.5–15.5)
WBC: 7.6 K/uL (ref 4.0–10.5)
nRBC: 0 % (ref 0.0–0.2)

## 2024-06-22 LAB — BASIC METABOLIC PANEL WITH GFR
Anion gap: 12 (ref 5–15)
BUN: 14 mg/dL (ref 8–23)
CO2: 24 mmol/L (ref 22–32)
Calcium: 8.7 mg/dL — ABNORMAL LOW (ref 8.9–10.3)
Chloride: 104 mmol/L (ref 98–111)
Creatinine, Ser: 1.28 mg/dL — ABNORMAL HIGH (ref 0.61–1.24)
GFR, Estimated: 52 mL/min — ABNORMAL LOW
Glucose, Bld: 133 mg/dL — ABNORMAL HIGH (ref 70–99)
Potassium: 3.4 mmol/L — ABNORMAL LOW (ref 3.5–5.1)
Sodium: 139 mmol/L (ref 135–145)

## 2024-06-22 LAB — GLUCOSE, CAPILLARY
Glucose-Capillary: 129 mg/dL — ABNORMAL HIGH (ref 70–99)
Glucose-Capillary: 165 mg/dL — ABNORMAL HIGH (ref 70–99)
Glucose-Capillary: 97 mg/dL (ref 70–99)
Glucose-Capillary: 116 mg/dL — ABNORMAL HIGH (ref 70–99)
Glucose-Capillary: 140 mg/dL — ABNORMAL HIGH (ref 70–99)

## 2024-06-22 LAB — MAGNESIUM: Magnesium: 2.1 mg/dL (ref 1.7–2.4)

## 2024-06-22 MED ORDER — ESCITALOPRAM OXALATE 20 MG PO TABS
20.0000 mg | ORAL_TABLET | Freq: Every day | ORAL | Status: DC
  Administered 2024-06-22 – 2024-06-27 (×6): 20 mg via ORAL
  Filled 2024-06-22 (×6): qty 1

## 2024-06-22 MED ORDER — OXYCODONE HCL 5 MG PO TABS
2.5000 mg | ORAL_TABLET | ORAL | Status: DC | PRN
  Filled 2024-06-22: qty 1

## 2024-06-22 MED ORDER — POTASSIUM CHLORIDE CRYS ER 20 MEQ PO TBCR
40.0000 meq | EXTENDED_RELEASE_TABLET | Freq: Once | ORAL | Status: AC
  Administered 2024-06-22: 40 meq via ORAL
  Filled 2024-06-22: qty 2

## 2024-06-22 MED ORDER — ACETAMINOPHEN 500 MG PO TABS
1000.0000 mg | ORAL_TABLET | Freq: Three times a day (TID) | ORAL | Status: DC
  Administered 2024-06-22 – 2024-06-28 (×18): 1000 mg via ORAL
  Filled 2024-06-22 (×18): qty 2

## 2024-06-22 MED ORDER — SODIUM CHLORIDE 0.9 % IV SOLN: INTRAVENOUS | Status: DC

## 2024-06-22 NOTE — Plan of Care (Signed)
" °  Problem: Coping: Goal: Ability to adjust to condition or change in health will improve Outcome: Progressing   Problem: Fluid Volume: Goal: Ability to maintain a balanced intake and output will improve Outcome: Progressing   Problem: Health Behavior/Discharge Planning: Goal: Ability to identify and utilize available resources and services will improve Outcome: Progressing   Problem: Metabolic: Goal: Ability to maintain appropriate glucose levels will improve Outcome: Progressing   Problem: Nutritional: Goal: Maintenance of adequate nutrition will improve Outcome: Progressing Goal: Progress toward achieving an optimal weight will improve Outcome: Progressing   Problem: Skin Integrity: Goal: Risk for impaired skin integrity will decrease Outcome: Progressing   Problem: Tissue Perfusion: Goal: Adequacy of tissue perfusion will improve Outcome: Progressing   Problem: Clinical Measurements: Goal: Ability to maintain clinical measurements within normal limits will improve Outcome: Progressing Goal: Will remain free from infection Outcome: Progressing Goal: Diagnostic test results will improve Outcome: Progressing Goal: Respiratory complications will improve Outcome: Progressing Goal: Cardiovascular complication will be avoided Outcome: Progressing   Problem: Activity: Goal: Risk for activity intolerance will decrease Outcome: Progressing   Problem: Nutrition: Goal: Adequate nutrition will be maintained Outcome: Progressing   Problem: Coping: Goal: Level of anxiety will decrease Outcome: Progressing   Problem: Elimination: Goal: Will not experience complications related to bowel motility Outcome: Progressing Goal: Will not experience complications related to urinary retention Outcome: Progressing   Problem: Pain Managment: Goal: General experience of comfort will improve and/or be controlled Outcome: Progressing   Problem: Safety: Goal: Ability to remain  free from injury will improve Outcome: Progressing   Problem: Skin Integrity: Goal: Risk for impaired skin integrity will decrease Outcome: Progressing   "

## 2024-06-22 NOTE — Plan of Care (Signed)
" °  Problem: Fluid Volume: Goal: Ability to maintain a balanced intake and output will improve Outcome: Progressing   Problem: Metabolic: Goal: Ability to maintain appropriate glucose levels will improve Outcome: Progressing   Problem: Nutritional: Goal: Maintenance of adequate nutrition will improve Outcome: Progressing   Problem: Nutrition: Goal: Adequate nutrition will be maintained Outcome: Progressing   Problem: Coping: Goal: Level of anxiety will decrease Outcome: Progressing   Problem: Elimination: Goal: Will not experience complications related to bowel motility Outcome: Progressing   Problem: Pain Managment: Goal: General experience of comfort will improve and/or be controlled Outcome: Progressing   "

## 2024-06-22 NOTE — Plan of Care (Signed)

## 2024-06-22 NOTE — Progress Notes (Addendum)
 " PROGRESS NOTE    [name]  FMW:[id number] DOB: September 13, 1927 DOA: 06/18/2024 PCP: Rexanne Ingle, MD   Brief Narrative:   89 y.o. male with history of bioprosthetic aortic valve replacement status post TAVR, history of CVA on Eliquis , hypertension, BPH, rheumatoid arthritis, psoriasis on Cosentyx, liver cirrhosis presented with worsening weakness and difficulty walking.  On presentation, creatinine was 1.4 which had increased from normal in 2023 with WBCs of 13.  CT of the abdomen and pelvis showed possible sigmoid diverticulitis.  CT of the left ankle showed acute ankle fracture.  He was started on IV fluids and antibiotics.  Orthopedics was consulted and recommended conservative management.  Assessment & Plan:   Acute kidney injury - Likely from poor oral intake and recent diarrhea.  Presented with a creatinine of 1.48.  Creatinine improving to 1.28 today.  Monitor.  Off IV fluids.  Acute sigmoid diverticulitis - No diarrhea since admission.  DC'd stool testing and isolation.  Continue Rocephin  and Flagyl .  Possible aspiration pneumonia - Chest x-ray did not show any focal consolidation.  Diet as per SLP recommendations.  Continue current antibiotics  Left ankle fracture -Continue conservative management with splint as per orthopedics recommendations.  Outpatient follow-up with orthopedics.  Patient will need to be nonweightbearing on his left lower extremity for the next 4 to 6 weeks.  Delirium History of dementia - Patient has been started on scheduled Seroquel .  Monitor mental status.  Delirium precautions.  Awake this morning.  Hypertension - Continue hydralazine .  ARB on hold.  Monitor blood pressure  Hypokalemia -Replace.  Repeat a.m. labs  History of bioprosthetic aortic valve replacement status post TAVR - Continue Eliquis .  Outpatient follow-up with cardiology  History of unspecified stroke - Continue Eliquis   Diabetes mellitus type 2 with hyperglycemia - A1c 7.0.   Continue CBGs with SSI.  Carb modified diet  BPH - Continue dutasteride   History of psoriasis and rheumatoid arthritis - On Cosentyx.  Used to be on methotrexate  and as per daughter, this was discontinued because of cirrhosis  History of liver cirrhosis - No active issues at this time.  Outpatient follow-up with PCP and GI  Goals of care - Overall prognosis is guarded to poor.  Palliative care consultation is pending.  Physical deconditioning - PT recommending SNF placement.  TOC following.  Patient is not a candidate for CIR as per CIR evaluation.  DVT prophylaxis: Eliquis  Code Status: DNR Family Communication: None at bedside. Called daughter/Dora on phone; she didn't pick up Disposition Plan: Status is: Inpatient Remains inpatient appropriate because: Of severity of illness    Consultants: Orthopedics.   palliative care  Procedures: None  Antimicrobials:  Anti-infectives (From admission, onward)    Start     Dose/Rate Route Frequency Ordered Stop   06/21/24 1000  metroNIDAZOLE  (FLAGYL ) tablet 500 mg        500 mg Oral Every 12 hours 06/21/24 0801     06/19/24 2100  cefTRIAXone  (ROCEPHIN ) 2 g in sodium chloride  0.9 % 100 mL IVPB        2 g 200 mL/hr over 30 Minutes Intravenous Every 24 hours 06/19/24 0019     06/19/24 1000  metroNIDAZOLE  (FLAGYL ) IVPB 500 mg  Status:  Discontinued        500 mg 100 mL/hr over 60 Minutes Intravenous Every 12 hours 06/19/24 0019 06/21/24 0801   06/18/24 2115  cefTRIAXone  (ROCEPHIN ) 2 g in sodium chloride  0.9 % 100 mL IVPB  Placed in And Linked Group   2 g 200 mL/hr over 30 Minutes Intravenous  Once 06/18/24 2102 06/18/24 2153   06/18/24 2115  metroNIDAZOLE  (FLAGYL ) IVPB 500 mg       Placed in And Linked Group   500 mg 100 mL/hr over 60 Minutes Intravenous  Once 06/18/24 2102 06/18/24 2257        Subjective: Patient seen and examined at bedside.  Poor historian.  No agitation, seizures, fever or vomiting  reported. Objective: Vitals:   06/21/24 1219 06/21/24 1917 06/22/24 0454 06/22/24 0632  BP: (!) 148/63 (!) 149/58  (!) 186/68  Pulse:  77  71  Resp:  16  19  Temp:  99.3 F (37.4 C)  97.8 F (36.6 C)  TempSrc:    Oral  SpO2:  94%    Weight:   85.1 kg   Height:        Intake/Output Summary (Last 24 hours) at 06/22/2024 0828 Last data filed at 06/22/2024 0810 Gross per 24 hour  Intake 700 ml  Output 100 ml  Net 600 ml   Filed Weights   06/20/24 2200 06/22/24 0454  Weight: 85.6 kg 85.1 kg    Examination:  General: On room air.  No distress ENT/neck: No thyromegaly.  JVD is not elevated  respiratory: Decreased breath sounds at bases bilaterally with some crackles; no wheezing  CVS: S1-S2 heard, rate mostly controlled Abdominal: Soft, nontender, slightly distended; no organomegaly, bowel sounds are heard Extremities: Trace lower extremity edema; no cyanosis  CNS: Awake; confused.  Extremity slow to respond and a poor historian.  No focal neurologic deficit.  Moves extremities Lymph: No obvious lymphadenopathy Skin: No obvious ecchymosis/lesions  psych: Affect is mostly flat.  Currently not agitated   musculoskeletal: No obvious joint swelling/deformity     Data Reviewed: I have personally reviewed following labs and imaging studies  CBC: Recent Labs  Lab 06/18/24 1619 06/19/24 0215 06/20/24 1324 06/22/24 0548  WBC 13.0* 8.8 8.1 7.6  NEUTROABS 11.2*  --  6.0 5.1  HGB 13.1 13.0 12.0* 11.2*  HCT 41.7 40.4 36.5* 35.1*  MCV 89.9 88.6 87.3 88.2  PLT 151 154 145* 166   Basic Metabolic Panel: Recent Labs  Lab 06/18/24 1619 06/19/24 0215 06/20/24 0638 06/22/24 0548  NA 138 140 140 139  K 5.2* 4.2 3.6 3.4*  CL 102 105 103 104  CO2 23 24 23 24   GLUCOSE 180* 167* 138* 133*  BUN 16 19 14 14   CREATININE 1.48* 1.46* 1.28* 1.28*  CALCIUM  9.2 8.9 9.6 8.7*  MG  --   --   --  2.1   GFR: Estimated Creatinine Clearance: 34 mL/min (A) (by C-G formula based on SCr of  1.28 mg/dL (H)). Liver Function Tests: Recent Labs  Lab 06/18/24 1619 06/19/24 0215  AST 51* 60*  ALT 38 38  ALKPHOS 74 76  BILITOT 1.2 1.0  PROT 7.4 7.0  ALBUMIN 4.0 3.9   Recent Labs  Lab 06/18/24 1619  LIPASE 40   No results for input(s): AMMONIA in the last 168 hours. Coagulation Profile: No results for input(s): INR, PROTIME in the last 168 hours. Cardiac Enzymes: No results for input(s): CKTOTAL, CKMB, CKMBINDEX, TROPONINI in the last 168 hours. BNP (last 3 results) No results for input(s): PROBNP in the last 8760 hours. HbA1C: No results for input(s): HGBA1C in the last 72 hours.  CBG: Recent Labs  Lab 06/21/24 0800 06/21/24 1128 06/21/24 1621 06/21/24 2149 06/22/24 0754  GLUCAP  126* 213* 134* 155* 129*   Lipid Profile: No results for input(s): CHOL, HDL, LDLCALC, TRIG, CHOLHDL, LDLDIRECT in the last 72 hours. Thyroid  Function Tests: No results for input(s): TSH, T4TOTAL, FREET4, T3FREE, THYROIDAB in the last 72 hours. Anemia Panel: No results for input(s): VITAMINB12, FOLATE, FERRITIN, TIBC, IRON, RETICCTPCT in the last 72 hours. Sepsis Labs: No results for input(s): PROCALCITON, LATICACIDVEN in the last 168 hours.  No results found for this or any previous visit (from the past 240 hours).       Radiology Studies: DG CHEST PORT 1 VIEW Result Date: 06/20/2024 EXAM: 1 VIEW XRAY OF THE CHEST 06/20/2024 12:57:00 PM COMPARISON: None available. CLINICAL HISTORY: Pneumonia FINDINGS: LINES, TUBES AND DEVICES: Multiple EKG leads noted. LUNGS AND PLEURA: Low lung volumes. Diffuse interstitial prominence. No focal consolidation. No pleural effusion. No pneumothorax. HEART AND MEDIASTINUM: Cardiomegaly. Status post TAVR. Aortic atherosclerotic calcifications. BONES AND SOFT TISSUES: Old healed right rib fractures. IMPRESSION: 1. No focal consolidation to suggest pneumonia. 2. Low lung volumes with diffuse  interstitial prominence. Electronically signed by: Norleen Boxer MD MD 06/20/2024 01:28 PM EST RP Workstation: [id number]        Scheduled Meds:  apixaban   5 mg Oral BID   dutasteride   0.5 mg Oral Daily   escitalopram   10 mg Oral QHS   hydrALAZINE   50 mg Oral Q8H   insulin  aspart  0-5 Units Subcutaneous QHS   insulin  aspart  0-9 Units Subcutaneous TID WC   metroNIDAZOLE   500 mg Oral Q12H   pantoprazole   40 mg Oral Daily   QUEtiapine   50 mg Oral Q24H   Continuous Infusions:  cefTRIAXone  (ROCEPHIN )  IV Stopped (06/21/24 2122)          Sophie Mao, MD [facility] 06/22/2024, 8:28 AM   "

## 2024-06-22 NOTE — Consult Note (Signed)
 "                                                                                   Consultation Note Date: 06/22/2024   Patient Name: [name]  DOB: [birth year]  MRN: [id number]  Age / Sex: 89 y.o., male  PCP: Rexanne Ingle, MD Referring Physician: Cheryle Page, MD  Reason for Consultation: Establishing goals of care  HPI/Patient Profile: 89 y.o. male admitted on 06/18/2024 with  .   Clinical Assessment and Goals of Care: 89 year old gentleman who lives at home and requires assistance from both his daughters [name] and Russ on an almost 24 hour basis.  Patient has a past medical history significant for bioprosthetic aortic valve placement status post TAVR, history of cerebrovascular accident and is on Eliquis , history of hypertension BPH rheumatoid arthritis psoriasis liver cirrhosis Patient admitted with generalized weakness difficulty walking was found to have CT abdomen showing sigmoid diverticulitis and CT left ankle showing acute ankle fracture patient was placed on IV fluids and antibiotics Palliative care consult for ongoing goals of care discussions has been requested CODE STATUS noted as DO NOT RESUSCITATE patient has previously completed MOST form on file Patient was seen and evaluated by orthopedics earlier in this hospitalization Patient was seen and evaluated and turndown for: Inpatient rehab recently Palliative consult continues Palliative medicine is specialized medical care for people living with serious illness. It focuses on providing relief from the symptoms and stress of a serious illness. The goal is to improve quality of life for both the patient and the family. Goals of care: Broad aims of medical therapy in relation to the patient's values and preferences. Our aim is to provide medical care aimed at enabling patients to achieve the goals that matter most to them, given the circumstances of their particular medical situation and their constraints.  Patient is awake  alert resting in chair he is left ankle is in a dressing.  He asks for the address to be taken off.  He appears in mild distress however he denies pain at the moment Call placed and was able to reach both daughters see goals of care discussions below   NEXT OF KIN  2 daughters Dora and Inna.   SUMMARY OF RECOMMENDATIONS   Goals of care discussions 1.  Functional decline weakness and difficulty ambulating 89 year old gentleman with significant medical comorbidity including prior TAVR history of CVA cirrhosis inflammatory arthritis baseline cognitive impairment now admitted with worsening weakness ankle fracture and inability to safely ambulate current decline appears to be multifactorial in etiology. 2.  Acute kidney injury likely secondary to poor oral intake and recent diarrheal illness creatinine improving gentle IV fluids have been started recommend continued monitoring of renal function and avoidance of nephrotoxic agents. 3.  Acute sigmoid diverticulitis CT imaging demonstrated possible sigmoid diverticulitis patient was given Rocephin  and Flagyl  4.  Left ankle fracture with pain Orthopedics recommended conservative management with splinting and strict nonweightbearing of left lower extremity for 4 to 6 weeks with outpatient orthopedic follow-up. Pain management options discussed extensively with daughters today at the time of initial palliative consultation.  Patient's daughters voiced concerns about opioid side effects, in the  setting of advanced age at this certainly poses delirium risk.  Plan is to decrease as needed oxycodone  immediate release dose and initiate scheduled acetaminophen  at this time.  We will reserve opioids for breakthrough pain only and family is in agreement with this approach 5.  Delirium risk possible history of dementia Patient has a history of cognitive impairment and is high risk for delirium given acute illness hospitalization and immobility patient is on Seroquel ,  recommend continued close monitoring of mental status recommend reorientation strategies and sleep-wake cycle preservation and delirium precautions 6.  Mood symptoms/anxiety  Patient's family very much concerned that he does not feel like himself emotionally they recommend that his home dose of Lexapro  at 20 mg to be resumed. 7.  Physical deconditioning and disposition planning Profound functional decline nonweightbearing not a candidate for aggressive inpatient rehabilitation, this was discussed in detail and explained to patient's family over the phone to the best of my ability as they had some questions as to why patient was declined by: Inpatient rehab.  Daughter is agreeable to pursue skilled nursing facility rehabilitation which is appropriate given his medical complexity and current limitations.  Transitions of care team is consulted and following.  Recommend outpatient palliative. 8.  Goals of care and palliative recommendations Overall prognosis is guarded given advanced age multi morbidity frailty limited physiologic reserve palliative medicine will continue to follow for additional support as well as symptom management as well as for aligning treatments with patient values and to assist with complex decision making at this time we recommend skilled nursing facility rehabilitation with outpatient palliative care follow-up.  Code Status/Advance Care Planning: DNR   Symptom Management:     Palliative Prophylaxis:  Delirium Protocol   Psycho-social/Spiritual:  Desire for further Chaplaincy support:yes Additional Recommendations: Caregiving  Support/Resources  Prognosis:  Unable to determine  Discharge Planning: Skilled Nursing Facility for rehab with Palliative care service follow-up       Primary Diagnoses: Present on Admission:  Diverticulitis  Benign essential HTN  RA (rheumatoid arthritis) (HCC)  Dementia without behavioral disturbance, psychotic disturbance, mood  disturbance, or anxiety (HCC)  Psoriatic arthritis of multiple joints (HCC)  Unspecified cirrhosis of liver (HCC)   I have reviewed the medical record, interviewed the patient and family, and examined the patient. The following aspects are pertinent.  Past Medical History:  Diagnosis Date   BPH (benign prostatic hypertrophy)    Depression    Diverticulosis of colon    GERD (gastroesophageal reflux disease)    Gilbert's syndrome    History of kidney stones    HLD (hyperlipidemia)    HOH (hard of hearing)    refuses to wear his Hearing Aid   Hypertension    Inguinal hernia    right   Internal hemorrhoid    Normal coronary arteries    a. Cath 07/2010: normal coronaries. Felt to have noncardiac CP/SOB at that time possibly r/t anxiety.   RA (rheumatoid arthritis) (HCC)    bilateral hands/ wrist--  seronegative   S/P TAVR (transcatheter aortic valve replacement) 03/27/2018   23 mm Edwards Sapien 3 transcatheter heart valve placed via percutaneous right transfemoral approach    Severe aortic stenosis    Thyroid  nodule    noted 11-2009   Wears dentures    Social History   Socioeconomic History   Marital status: Widowed    Spouse name: Not on file   Number of children: Not on file   Years of education: Not on file  Highest education level: Not on file  Occupational History   Not on file  Tobacco Use   Smoking status: Former    Current packs/day: 0.00    Types: Cigarettes    Start date: 06/13/1973    Quit date: 04/03/1981    Years since quitting: 43.2   Smokeless tobacco: Never  Vaping Use   Vaping status: Never Used  Substance and Sexual Activity   Alcohol use: Yes    Comment: occasional   Drug use: No   Sexual activity: Not on file  Other Topics Concern   Not on file  Social History Narrative   Not on file   Social Drivers of Health   Tobacco Use: Medium Risk (06/19/2024)   Patient History    Smoking Tobacco Use: Former    Smokeless Tobacco Use: Never     Passive Exposure: Not on Actuary Strain: Not on file  Food Insecurity: No Food Insecurity (06/19/2024)   Epic    Worried About Programme Researcher, Broadcasting/film/video in the Last Year: Never true    Ran Out of Food in the Last Year: Never true  Transportation Needs: No Transportation Needs (06/19/2024)   Epic    Lack of Transportation (Medical): No    Lack of Transportation (Non-Medical): No  Physical Activity: Not on file  Stress: Not on file  Social Connections: Patient Unable To Answer (06/19/2024)   Social Connection and Isolation Panel    Frequency of Communication with Friends and Family: Patient unable to answer    Frequency of Social Gatherings with Friends and Family: Patient unable to answer    Attends Religious Services: Patient unable to answer    Active Member of Clubs or Organizations: Patient unable to answer    Attends Banker Meetings: Patient unable to answer    Marital Status: Patient unable to answer  Depression (PHQ2-9): Not on file  Alcohol Screen: Not on file  Housing: Low Risk (06/19/2024)   Epic    Unable to Pay for Housing in the Last Year: No    Number of Times Moved in the Last Year: 0    Homeless in the Last Year: No  Utilities: Not At Risk (06/19/2024)   Epic    Threatened with loss of utilities: No  Health Literacy: Not on file   Family History  Problem Relation Age of Onset   Pulmonary embolism Mother 82       caused by complications of surgery   CVA Father    Diabetes Brother    Diabetes Brother    Breast cancer Sister    Scheduled Meds:  apixaban   5 mg Oral BID   dutasteride   0.5 mg Oral Daily   escitalopram   10 mg Oral QHS   hydrALAZINE   50 mg Oral Q8H   insulin  aspart  0-5 Units Subcutaneous QHS   insulin  aspart  0-9 Units Subcutaneous TID WC   metroNIDAZOLE   500 mg Oral Q12H   pantoprazole   40 mg Oral Daily   QUEtiapine   50 mg Oral Q24H   Continuous Infusions:  sodium chloride  75 mL/hr at 06/22/24 1222   cefTRIAXone  (ROCEPHIN )   IV Stopped (06/21/24 2122)   PRN Meds:.acetaminophen  **OR** acetaminophen , melatonin, oxyCODONE  Medications Prior to Admission:  Prior to Admission medications  Medication Sig Start Date End Date Taking? Authorizing Provider  apixaban  (ELIQUIS ) 5 MG TABS tablet Take 1 tablet (5 mg total) by mouth 2 (two) times daily. 02/07/24  Yes Jeffrie Oneil BROCKS, MD  dutasteride  (AVODART ) 0.5 MG capsule Take 0.5 mg by mouth daily.   Yes [provider]  escitalopram  (LEXAPRO ) 10 MG tablet Take 10 mg by mouth daily. 01/10/24  Yes [provider]  losartan  (COZAAR ) 25 MG tablet Take 25 mg by mouth daily. 05/01/24  Yes [provider]  pantoprazole  (PROTONIX ) 40 MG tablet Take 1 tablet (40 mg total) by mouth daily. 09/27/18  Yes Vann, Jessica U, DO  SKYRIZI  PEN 150 MG/ML SOAJ INJECT 150MG  SUBCUTANEOUSLY EVERY 12 WEEKS AS DIRECTED. Patient taking differently: Inject 150 mg into the skin See admin instructions. INJECT 150MG  SUBCUTANEOUSLY EVERY 12 WEEKS AS DIRECTED. 02/02/22  Yes Livingston Rigg, MD  clobetasol  cream (TEMOVATE ) 0.05 % Apply 1 application. topically 2 (two) times daily. Patient not taking: Reported on 06/22/2024 09/29/21   Livingston Rigg, MD  furosemide  (LASIX ) 20 MG tablet TAKE 1 TABLET(20 MG) BY MOUTH DAILY AS NEEDED Patient not taking: Reported on 06/22/2024 09/12/23   Verlin Lonni BIRCH, MD  losartan  (COZAAR ) 50 MG tablet Take 1 tablet (50 mg total) by mouth daily. 02/07/24 05/07/24  Jeffrie Oneil BROCKS, MD  Melatonin 5 MG TABS Take 10-15 mg by mouth as needed (for sleep). Patient not taking: Reported on 06/22/2024    [provider]  Multiple Vitamin (MULTIVITAMIN WITH MINERALS) TABS tablet Take 1 tablet by mouth daily. Patient not taking: Reported on 06/22/2024 12/26/21   Cheryle Page, MD  polyethylene glycol (MIRALAX ) 17 g packet Take 17 g by mouth daily as needed for moderate constipation. Patient not taking: Reported on 06/22/2024 12/25/21   Cheryle Page, MD   potassium chloride  (KLOR-CON ) 10 MEQ tablet Take 1 tablet (10 mEq total) by mouth daily. Patient not taking: Reported on 06/22/2024 09/12/23   Verlin Lonni BIRCH, MD  senna-docusate (SENOKOT-S) 8.6-50 MG tablet Take 1 tablet by mouth 2 (two) times daily. Patient not taking: Reported on 06/22/2024 12/25/21   Cheryle Page, MD   Allergies[1] Review of Systems +appears weak  Physical Exam Weak appearing elderly gentleman Sitting up in a chair Left lower extremity in splint and dressing Regular work of breathing Flat affect Abdomen not distended  Vital Signs: BP (!) 186/68 (BP Location: Right Arm)   Pulse 71   Temp 97.8 F (36.6 C) (Oral)   Resp 19   Ht 5' 3 (1.6 m)   Wt 85.1 kg   SpO2 94%   BMI 33.23 kg/m  Pain Scale: Faces   Pain Score: Asleep   SpO2: SpO2: 94 % O2 Device:SpO2: 94 % O2 Flow Rate: .   IO: Intake/output summary:  Intake/Output Summary (Last 24 hours) at 06/22/2024 1312 Last data filed at 06/22/2024 9075 Gross per 24 hour  Intake 700 ml  Output 100 ml  Net 600 ml    LBM: Last BM Date : 06/22/24 Baseline Weight: Weight: 85.6 kg Most recent weight: Weight: 85.1 kg     Palliative Assessment/Data:   PPS 50%  Time In: 150   Time Out:  1615 Time Total:  75 Greater than 50%  of this time was spent counseling and coordinating care related to the above assessment and plan. I personally spent a total of 75 minutes in the care of the patient today including preparing to see the patient, getting/reviewing separately obtained history, performing a medically appropriate exam/evaluation, counseling and educating, placing orders, documenting clinical information in the EHR, independently interpreting results, communicating results, and coordinating care.  Signed by: Lonia Serve, MD   Please contact Palliative Medicine Team phone at  [phone] for questions and concerns.  For individual provider: See Amion                 [1]   Allergies Allergen Reactions   Ativan  [Lorazepam ] Other (See Comments)    hallucinations   Phenergan  [Promethazine  Hcl] Hypertension    hallucinations   Haldol  [Haloperidol ]     hallucinations   Methocarbamol  Other (See Comments)    Raises blood pressure    Solifenacin Other (See Comments)    Not effective    "

## 2024-06-23 DIAGNOSIS — R531 Weakness: Secondary | ICD-10-CM | POA: Diagnosis not present

## 2024-06-23 DIAGNOSIS — N179 Acute kidney failure, unspecified: Secondary | ICD-10-CM | POA: Diagnosis not present

## 2024-06-23 DIAGNOSIS — Z7189 Other specified counseling: Secondary | ICD-10-CM | POA: Diagnosis not present

## 2024-06-23 DIAGNOSIS — Z515 Encounter for palliative care: Secondary | ICD-10-CM | POA: Diagnosis not present

## 2024-06-23 LAB — BASIC METABOLIC PANEL WITH GFR
Anion gap: 13 (ref 5–15)
BUN: 18 mg/dL (ref 8–23)
CO2: 20 mmol/L — ABNORMAL LOW (ref 22–32)
Calcium: 8.7 mg/dL — ABNORMAL LOW (ref 8.9–10.3)
Chloride: 108 mmol/L (ref 98–111)
Creatinine, Ser: 1.26 mg/dL — ABNORMAL HIGH (ref 0.61–1.24)
GFR, Estimated: 53 mL/min — ABNORMAL LOW
Glucose, Bld: 140 mg/dL — ABNORMAL HIGH (ref 70–99)
Potassium: 3.8 mmol/L (ref 3.5–5.1)
Sodium: 142 mmol/L (ref 135–145)

## 2024-06-23 LAB — GLUCOSE, CAPILLARY
Glucose-Capillary: 134 mg/dL — ABNORMAL HIGH (ref 70–99)
Glucose-Capillary: 159 mg/dL — ABNORMAL HIGH (ref 70–99)
Glucose-Capillary: 131 mg/dL — ABNORMAL HIGH (ref 70–99)
Glucose-Capillary: 108 mg/dL — ABNORMAL HIGH (ref 70–99)

## 2024-06-23 LAB — MAGNESIUM: Magnesium: 2.1 mg/dL (ref 1.7–2.4)

## 2024-06-23 MED ORDER — SENNOSIDES-DOCUSATE SODIUM 8.6-50 MG PO TABS
1.0000 | ORAL_TABLET | Freq: Two times a day (BID) | ORAL | Status: DC
  Administered 2024-06-23 – 2024-06-28 (×11): 1 via ORAL
  Filled 2024-06-23 (×11): qty 1

## 2024-06-23 MED ORDER — PSYLLIUM 95 % PO PACK
1.0000 | PACK | Freq: Every day | ORAL | Status: DC
  Administered 2024-06-23 – 2024-06-28 (×6): 1 via ORAL
  Filled 2024-06-23 (×6): qty 1

## 2024-06-23 MED ORDER — SODIUM CHLORIDE 0.9 % IV SOLN: INTRAVENOUS | Status: DC

## 2024-06-23 NOTE — TOC Progression Note (Addendum)
 Transition of Care [facility]) - Progression Note    Patient Details  Name: [name] MRN: [id number] Date of Birth: [birth year]  Transition of Care [facility]) CM/SW Contact  Heather DELENA Saltness, LCSW Phone Number: 06/23/2024, 12:53 PM  Clinical Narrative:    Pt screened for CIR, per CIR admissions coordinator, [name], pt declined due to insurance not covering pt's ankle fracture injury to go to CIR. CSW spoke with pt's daughter, Dora Ilyasova [phone], via phone call to provide update and discuss discharge plan. CSW advised daughter of issue with pt's currentl [employer] coverage. Pt's daughter inquired if pt's insurance can be changed back to Traditional Medicare. CSW advised daughter to reach out to [facility] directly. Daughter then inquired if prior authorzation has been requested. CSW advised per CIR admissions note, pt's [employer] requires prior authorization, and based on current payor data [employer] would not approve CIR for an orthopedic injury. Daughter states So the insurance coverage wasn't even requested? We're not going to let people at the hospital make that decision. It needs to be submitted and the answer needs to come from [facility]. Pt's daughter then inquired to speak to [facility] admissions coordinator. CSW advised she will let CIR admissions coordinator know her concerns. Secure chat sent to [facility] admissions coordinators, [place]lin and [place]. CSW advised of [facility] in [place]. Pt's daughter agreeable to send referral. CSW completed FL2 and sent referral to [facility] in [place]. TOC will continue to follow.   Expected Discharge Plan: Home w Home Health Services Barriers to Discharge: Continued Medical Work up   Expected Discharge Plan and Services In-house Referral: Clinical Social Work Discharge Planning Services: NA Post Acute Care Choice: Resumption of Svcs/PTA Provider, IP Rehab Living arrangements for the past 2 months: Single Family Home                 DME  Arranged: N/A DME Agency: NA       HH Arranged: NA HH Agency: NA         Social Drivers of Health (SDOH) Interventions SDOH Screenings   Food Insecurity: No Food Insecurity (06/19/2024)  Housing: Low Risk (06/19/2024)  Transportation Needs: No Transportation Needs (06/19/2024)  Utilities: Not At Risk (06/19/2024)  Social Connections: Patient Unable To Answer (06/19/2024)  Tobacco Use: Medium Risk (06/19/2024)    Readmission Risk Interventions    06/20/2024    2:21 PM  Readmission Risk Prevention Plan  Transportation Screening Complete  PCP or Specialist Appt within 5-7 Days Complete  Home Care Screening Complete  Medication Review (RN CM) Complete   Signed: Heather Saltness, MSW, LCSW Clinical Social Worker Inpatient Care Management 06/23/2024 2:26 PM

## 2024-06-23 NOTE — [place] FL2 (Signed)
 " [place]  MEDICAID FL2 LEVEL OF CARE FORM     IDENTIFICATION  Patient Name: [name] Birthdate: February 18, 1928 Sex: male Admission Date (Current Location): 06/18/2024  [facility] and [place] Number:  Producer, Television/film/video and Address:  [facility],  [address] [place], [place] [address]      Provider Number: [id number]  Attending Physician Name and Address:  Cheryle Page, MD  Relative Name and Phone Number:  Chanse, Kagel (Daughter), Emergency Contact  [phone]    Current Level of Care: Hospital Recommended Level of Care: Skilled Nursing Facility Prior Approval Number:    Date Approved/Denied:   PASRR Number:    Discharge Plan: Other (Comment) (AIR)    Current Diagnoses: Patient Active Problem List   Diagnosis Date Noted   General weakness 06/22/2024   Goals of care, counseling/discussion 06/22/2024   ARF (acute renal failure) 06/19/2024   History of stroke 06/19/2024   Closed left ankle fracture 06/19/2024   Closed nondisplaced trimalleolar fracture of left lower leg 06/19/2024   Diverticulitis 06/18/2024   Pain due to onychomycosis of toenails of both feet 07/21/2022   Dermatitis 07/21/2022   Psoriatic arthritis of multiple joints (HCC) 04/03/2022   Palliative care by specialist 02/20/2022   Difficulty swallowing pills 02/20/2022   CVA (cerebral vascular accident) (HCC) 12/25/2021   Symptomatic severe aortic stenosis with normal ejection fraction 12/21/2021   Near syncope 12/20/2021   Dementia without behavioral disturbance, psychotic disturbance, mood disturbance, or anxiety (HCC) 12/20/2021   Unspecified cirrhosis of liver (HCC) 12/20/2021   Hyperbilirubinemia 12/20/2021   Syncope 10/04/2019   Abdominal pain 10/09/2018   CKD (chronic kidney disease), stage II 10/09/2018   RUQ pain    Elevated LFTs 09/22/2018   Inguinal hernia of right side without obstruction or gangrene 08/22/2018   Chronic diastolic CHF (congestive heart failure) (HCC)  07/24/2018   Left-sided carotid artery disease 07/24/2018   Chest pain 06/05/2018   S/P TAVR (transcatheter aortic valve replacement) 03/27/2018   RA (rheumatoid arthritis) (HCC)    HOH (hard of hearing)    GERD (gastroesophageal reflux disease)    Benign prostatic hyperplasia    HLD (hyperlipidemia)    Depression    Diverticulosis 02/07/2018   Severe aortic stenosis    Benign essential HTN    Musculoskeletal chest pain 08/20/2016   Neoplasm of uncertain behavior of thyroid  gland, 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   Sinus bradycardia 06/20/2014    Orientation RESPIRATION BLADDER Height & Weight     Self  Normal Incontinent Weight: 187 lb 9.8 oz (85.1 kg) Height:  5' 3 (160 cm)  BEHAVIORAL SYMPTOMS/MOOD NEUROLOGICAL BOWEL NUTRITION STATUS      Incontinent Diet (Low Sodium Heart Healthy)  AMBULATORY STATUS COMMUNICATION OF NEEDS Skin   Extensive Assist Verbally Normal                       Personal Care Assistance Level of Assistance  Bathing, Feeding, Dressing Bathing Assistance: Maximum assistance   Dressing Assistance: Maximum assistance     Functional Limitations Info  Sight, Hearing, Speech Sight Info: Impaired Hearing Info: Impaired Speech Info: Adequate    SPECIAL CARE FACTORS FREQUENCY  PT (By licensed PT), OT (By licensed OT)     PT Frequency: 5x per week OT Frequency: 5x per week            Contractures Contractures Info: Not present    Additional Factors Info  Code Status, Allergies Code Status Info: DNR Allergies Info: Ativan  (Lorazepam ),  Phenergan  (Promethazine  Hcl), Haldol  (Haloperidol ), Methocarbamol , Solifenacin           Current Medications (06/23/2024):  This is the current hospital active medication list Current Facility-Administered Medications  Medication Dose Route Frequency Provider Last Rate Last Admin   0.9 %  sodium chloride  infusion   Intravenous Continuous Cheryle Page, MD 75 mL/hr at 06/23/24 1144 New Bag at 06/23/24 1144    acetaminophen  (TYLENOL ) tablet 1,000 mg  1,000 mg Oral Q8H Anwar, Zeba, MD   1,000 mg at 06/23/24 1354   apixaban  (ELIQUIS ) tablet 5 mg  5 mg Oral BID Kakrakandy, Arshad N, MD   5 mg at 06/23/24 9072   cefTRIAXone  (ROCEPHIN ) 2 g in sodium chloride  0.9 % 100 mL IVPB  2 g Intravenous Q24H Kakrakandy, Arshad N, MD   Stopped at 06/22/24 2110   dutasteride  (AVODART ) capsule 0.5 mg  0.5 mg Oral Daily Franky Redia SAILOR, MD   0.5 mg at 06/23/24 9072   escitalopram  (LEXAPRO ) tablet 20 mg  20 mg Oral QHS Anwar, Zeba, MD   20 mg at 06/22/24 2038   hydrALAZINE  (APRESOLINE ) tablet 50 mg  50 mg Oral Q8H Pahwani, Ravi, MD   50 mg at 06/23/24 1354   insulin  aspart (novoLOG ) injection 0-5 Units  0-5 Units Subcutaneous QHS Pahwani, Fredia, MD       insulin  aspart (novoLOG ) injection 0-9 Units  0-9 Units Subcutaneous TID WC Pahwani, Ravi, MD   2 Units at 06/23/24 1144   melatonin tablet 10-15 mg  10-15 mg Oral PRN Pahwani, Ravi, MD   15 mg at 06/22/24 2038   metroNIDAZOLE  (FLAGYL ) tablet 500 mg  500 mg Oral Q12H Alekh, Kshitiz, MD   500 mg at 06/23/24 9072   oxyCODONE  (Oxy IR/ROXICODONE ) immediate release tablet 2.5 mg  2.5 mg Oral Q4H PRN Anwar, Zeba, MD       pantoprazole  (PROTONIX ) EC tablet 40 mg  40 mg Oral Daily Kakrakandy, Arshad N, MD   40 mg at 06/23/24 9072   psyllium (HYDROCIL/METAMUCIL) 1 packet  1 packet Oral Daily Alekh, Kshitiz, MD       QUEtiapine  (SEROQUEL ) tablet 50 mg  50 mg Oral Q24H Pahwani, Ravi, MD   50 mg at 06/22/24 2038   senna-docusate (Senokot-S) tablet 1 tablet  1 tablet Oral BID Cheryle Page, MD   1 tablet at 06/23/24 1145     Discharge Medications: Please see discharge summary for a list of discharge medications.  Relevant Imaging Results:  Relevant Lab Results:   Additional Information SSN: [id number]  Heather LABOR Jylan Loeza, LCSW     "

## 2024-06-23 NOTE — Plan of Care (Signed)
" °  Problem: Fluid Volume: Goal: Ability to maintain a balanced intake and output will improve Outcome: Progressing   Problem: Metabolic: Goal: Ability to maintain appropriate glucose levels will improve Outcome: Progressing   Problem: Skin Integrity: Goal: Risk for impaired skin integrity will decrease Outcome: Progressing   Problem: Elimination: Goal: Will not experience complications related to bowel motility Outcome: Progressing Goal: Will not experience complications related to urinary retention Outcome: Progressing   Problem: Pain Managment: Goal: General experience of comfort will improve and/or be controlled Outcome: Progressing   Problem: Safety: Goal: Ability to remain free from injury will improve Outcome: Progressing   "

## 2024-06-23 NOTE — Progress Notes (Signed)
 "                                                                                                                                                                                                          Daily Progress Note   Patient Name: [name]       Date: 06/23/2024 DOB: 05-09-1928  Age: 89 y.o. MRN#: [id number] Attending Physician: Cheryle Page, MD Primary Care Physician: Rexanne Ingle, MD Admit Date: 06/18/2024  Reason for Consultation/Follow-up: Establishing goals of care and Pain control  Subjective:  Awake alert, friend at bedside, resting in bed, states that he feels better, pain in ankle is better controlled, rested well overnight.   Length of Stay: 4  Current Medications: Scheduled Meds:   acetaminophen   1,000 mg Oral Q8H   apixaban   5 mg Oral BID   dutasteride   0.5 mg Oral Daily   escitalopram   20 mg Oral QHS   hydrALAZINE   50 mg Oral Q8H   insulin  aspart  0-5 Units Subcutaneous QHS   insulin  aspart  0-9 Units Subcutaneous TID WC   metroNIDAZOLE   500 mg Oral Q12H   pantoprazole   40 mg Oral Daily   psyllium  1 packet Oral Daily   QUEtiapine   50 mg Oral Q24H   senna-docusate  1 tablet Oral BID    Continuous Infusions:  sodium chloride      cefTRIAXone  (ROCEPHIN )  IV Stopped (06/22/24 2110)    PRN Meds: melatonin, oxyCODONE   Physical Exam         Awake alert No distress L LE in splint/dressing Abdomen not distended Regular work of breathing.   Vital Signs: BP (!) 153/54 (BP Location: Right Arm)   Pulse (!) 55   Temp 97.7 F (36.5 C) (Oral)   Resp 16   Ht 5' 3 (1.6 m)   Wt 85.1 kg   SpO2 97%   BMI 33.23 kg/m  SpO2: SpO2: 97 % O2 Device: O2 Device: Room Air O2 Flow Rate:    Intake/output summary:  Intake/Output Summary (Last 24 hours) at 06/23/2024 1143 Last data filed at 06/23/2024 0700 Gross per 24 hour  Intake 1699.05 ml  Output 750 ml  Net 949.05 ml   LBM: Last BM Date : 06/23/24 Baseline Weight: Weight: 85.6 kg Most recent weight:  Weight: 85.1 kg       Palliative Assessment/Data:      Patient Active Problem List   Diagnosis Date Noted   General weakness 06/22/2024   Goals of care, counseling/discussion 06/22/2024   ARF (acute renal failure) 06/19/2024  History of stroke 06/19/2024   Closed left ankle fracture 06/19/2024   Closed nondisplaced trimalleolar fracture of left lower leg 06/19/2024   Diverticulitis 06/18/2024   Pain due to onychomycosis of toenails of both feet 07/21/2022   Dermatitis 07/21/2022   Psoriatic arthritis of multiple joints (HCC) 04/03/2022   Palliative care by specialist 02/20/2022   Difficulty swallowing pills 02/20/2022   CVA (cerebral vascular accident) (HCC) 12/25/2021   Symptomatic severe aortic stenosis with normal ejection fraction 12/21/2021   Near syncope 12/20/2021   Dementia without behavioral disturbance, psychotic disturbance, mood disturbance, or anxiety (HCC) 12/20/2021   Unspecified cirrhosis of liver (HCC) 12/20/2021   Hyperbilirubinemia 12/20/2021   Syncope 10/04/2019   Abdominal pain 10/09/2018   CKD (chronic kidney disease), stage II 10/09/2018   RUQ pain    Elevated LFTs 09/22/2018   Inguinal hernia of right side without obstruction or gangrene 08/22/2018   Chronic diastolic CHF (congestive heart failure) (HCC) 07/24/2018   Left-sided carotid artery disease 07/24/2018   Chest pain 06/05/2018   S/P TAVR (transcatheter aortic valve replacement) 03/27/2018   RA (rheumatoid arthritis) (HCC)    HOH (hard of hearing)    GERD (gastroesophageal reflux disease)    Benign prostatic hyperplasia    HLD (hyperlipidemia)    Depression    Diverticulosis 02/07/2018   Severe aortic stenosis    Benign essential HTN    Musculoskeletal chest pain 08/20/2016   Neoplasm of uncertain behavior of thyroid  gland, 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   Sinus bradycardia 06/20/2014    Palliative Care Assessment & Plan       This is a 89 year old man with advanced multimorbidity,  including prior TAVR, remote CVA, cirrhosis, inflammatory arthritis, and baseline cognitive impairment, admitted with acute on chronic functional decline following a left ankle fracture. He is currently unable to ambulate safely, and his decline is multifactorial, related to advanced age, frailty, acute orthopedic injury, and significant deconditioning.  Orthopedic surgery has recommended nonoperative management of the left ankle fracture with splinting and strict non-weight-bearing of the left lower extremity for approximately 4-6 weeks, with outpatient follow-up. Pain is being managed with scheduled acetaminophen  1000 mg three times daily and low-dose oral oxycodone  IR 2.5 mg as needed. The patient has required only a single PRN dose in the past 24 hours, indicating acceptable symptom control with a regimen that minimizes opioid-related risk in the setting of advanced age and medical complexity.  He remains on escitalopram  20 mg daily for chronic mood and anxiety symptoms, without evidence of acute agitation or distress at this time.  From a functional and disposition standpoint, the patient has experienced profound decline and is now strictly non-weight-bearing, significantly limiting rehabilitation potential. After multidisciplinary assessment, he is not considered a candidate for intensive inpatient rehabilitation due to medical complexity, frailty, and limited physiologic reserve. This determination and its rationale were reviewed in detail with the patients daughter via telephone. She expressed understanding and is agreeable to discharge planning focused on skilled nursing facility rehabilitation. Transitions of Care is actively involved in coordination.  Medical decision-making is of moderate complexity, given management of multiple chronic conditions with acute exacerbation, recent orthopedic injury with functional consequences, ongoing symptom management requiring medication oversight, and high  risk for further decline. The patients advanced age, frailty, cognitive impairment, and non-weight-bearing status place him at increased risk for complications including falls, delirium, immobility-related morbidity, and failure to recover baseline function.  Code Status:    Code Status Orders  (From admission, onward)  Start     Ordered   06/19/24 0016  Do not attempt resuscitation (DNR)- Limited -Do Not Intubate (DNI)  Continuous       Question Answer Comment  If pulseless and not breathing No CPR or chest compressions.   In Pre-Arrest Conditions (Patient Is Breathing and Has A Pulse) Do not intubate. Provide all appropriate non-invasive medical interventions. Avoid ICU transfer unless indicated or required.   Consent: Discussion documented in EHR or advanced directives reviewed      06/19/24 0018           Code Status History     Date Active Date Inactive Code Status Order ID Comments User Context   12/20/2021 0509 12/26/2021 1703 Full Code [id number]  Charlton Evalene RAMAN, MD ED   10/04/2019 2354 10/05/2019 2328 Full Code [id number]  Ricky Alfrieda DASEN, DO ED   10/09/2018 2006 10/13/2018 0008 DNR [id number]  Hilma Rankins, MD ED   09/22/2018 1544 09/26/2018 1905 DNR [id number]  Nikki Hansel Atlas, MD ED   06/04/2018 2340 06/05/2018 1542 Full Code [id number]  Martie Gatha ORN, MD Inpatient   03/27/2018 1206 03/28/2018 1518 Full Code [id number]  Sebastian Lamarr SAUNDERS, PA-C Inpatient   02/04/2018 1537 02/07/2018 2109 Full Code [id number]  Leopold Damien NOVAK, MD Inpatient   08/28/2016 1218 09/06/2016 1711 Full Code [id number]  Pegge Toribio PARAS, PA-C Inpatient   08/28/2016 1218 08/28/2016 1218 Full Code [id number]  Pegge Toribio PARAS, PA-C Inpatient   08/13/2016 2032 08/28/2016 1216 Full Code [id number]  Ebbie Cough, MD Inpatient   05/05/2015 1142 05/06/2015 1303 Full Code [id number]  Eletha Boas, MD Inpatient       Prognosis:  Unable to determine  Discharge Planning: Skilled Nursing Facility  for rehab with Palliative care service follow-up  Care plan was discussed with  patient and friend.   Thank you for allowing the Palliative Medicine Team to assist in the care of this patient. Mod MDM.     Greater than 50%  of this time was spent counseling and coordinating care related to the above assessment and plan.  Lonia Serve, MD  Please contact Palliative Medicine Team phone at [phone] for questions and concerns.       "

## 2024-06-23 NOTE — Progress Notes (Addendum)
 " PROGRESS NOTE    [name]  FMW:[id number] DOB: [birth year] DOA: 06/18/2024 PCP: Rexanne Ingle, MD   Brief Narrative:   89 y.o. male with history of bioprosthetic aortic valve replacement status post TAVR, history of CVA on Eliquis , hypertension, BPH, rheumatoid arthritis, psoriasis on Cosentyx, liver cirrhosis presented with worsening weakness and difficulty walking.  On presentation, creatinine was 1.4 which had increased from normal in 2023 with WBCs of 13.  CT of the abdomen and pelvis showed possible sigmoid diverticulitis.  CT of the left ankle showed acute ankle fracture.  He was started on IV fluids and antibiotics.  Orthopedics was consulted and recommended conservative management.  Assessment & Plan:   Acute kidney injury - Likely from poor oral intake and recent diarrhea.  Presented with a creatinine of 1.48.  Creatinine improving to 1.26 today.  Monitor.    Acute sigmoid diverticulitis - No diarrhea since admission.  DC'd stool testing and isolation.  Continue Rocephin  and Flagyl .  Possible aspiration pneumonia - Chest x-ray did not show any focal consolidation.  Diet as per SLP recommendations.  Continue current antibiotics  Left ankle fracture -Continue conservative management with splint as per orthopedics recommendations.  Outpatient follow-up with orthopedics.  Patient will need to be nonweightbearing on his left lower extremity for the next 4 to 6 weeks.  Delirium History of dementia - Patient has been started on scheduled Seroquel .  Monitor mental status.  Delirium precautions.  Awake this morning.  Hypertension - Continue hydralazine .  ARB on hold.  Monitor blood pressure  Hypokalemia - Resolved  History of bioprosthetic aortic valve replacement status post TAVR - Continue Eliquis .  Outpatient follow-up with cardiology  History of unspecified stroke - Continue Eliquis   Diabetes mellitus type 2 with hyperglycemia - A1c 7.0.  Continue CBGs with SSI.  Carb  modified diet  BPH - Continue dutasteride   History of psoriasis and rheumatoid arthritis - On Cosentyx.  Used to be on methotrexate  and as per daughter, this was discontinued because of cirrhosis  History of liver cirrhosis - No active issues at this time.  Outpatient follow-up with PCP and GI  Goals of care - Overall prognosis is guarded to poor.  Palliative care following.  Physical deconditioning - PT recommending SNF placement.  TOC following.  Patient is not a candidate for CIR as per CIR evaluation.  DVT prophylaxis: Eliquis  Code Status: DNR Family Communication: Called  and spoke to daughters /Dora and Inna on phone.  Spoke to family sitter at bedside today as well. Disposition Plan: Status is: Inpatient Remains inpatient appropriate because: Of severity of illness.  Need for SNF placement    Consultants: Orthopedics.   palliative care  Procedures: None  Antimicrobials:  Anti-infectives (From admission, onward)    Start     Dose/Rate Route Frequency Ordered Stop   06/21/24 1000  metroNIDAZOLE  (FLAGYL ) tablet 500 mg        500 mg Oral Every 12 hours 06/21/24 0801     06/19/24 2100  cefTRIAXone  (ROCEPHIN ) 2 g in sodium chloride  0.9 % 100 mL IVPB        2 g 200 mL/hr over 30 Minutes Intravenous Every 24 hours 06/19/24 0019     06/19/24 1000  metroNIDAZOLE  (FLAGYL ) IVPB 500 mg  Status:  Discontinued        500 mg 100 mL/hr over 60 Minutes Intravenous Every 12 hours 06/19/24 0019 06/21/24 0801   06/18/24 2115  cefTRIAXone  (ROCEPHIN ) 2 g in sodium chloride  0.9 % 100  mL IVPB       Placed in And Linked Group   2 g 200 mL/hr over 30 Minutes Intravenous  Once 06/18/24 2102 06/18/24 2153   06/18/24 2115  metroNIDAZOLE  (FLAGYL ) IVPB 500 mg       Placed in And Linked Group   500 mg 100 mL/hr over 60 Minutes Intravenous  Once 06/18/24 2102 06/18/24 2257        Subjective: Patient seen and examined at bedside.  Poor historian.  No seizures, fever or vomiting  nausea.   Objective: Vitals:   06/23/24 0239 06/23/24 0241 06/23/24 0530 06/23/24 0534  BP:  (!) 135/49 (!) 153/54   Pulse: (!) 57 (!) 56 (!) 55   Resp:  16    Temp:  97.7 F (36.5 C)    TempSrc:  Oral    SpO2: 96% 97% 97%   Weight:    85.1 kg  Height:        Intake/Output Summary (Last 24 hours) at 06/23/2024 0840 Last data filed at 06/23/2024 0700 Gross per 24 hour  Intake 1939.05 ml  Output 750 ml  Net 1189.05 ml   Filed Weights   06/20/24 2200 06/22/24 0454 06/23/24 0534  Weight: 85.6 kg 85.1 kg 85.1 kg    Examination:  General: No acute distress.  Remains on room air. ENT/neck: No JVD elevation or palpable neck masses  respiratory: Bilateral decreased breath sounds at bases with scattered crackles CVS: Bradycardic; S1 and S2 are heard Abdominal: Soft, mildly tender; remains distended; no organomegaly, bowel sounds are heard normally Extremities: No clubbing; mild lower extremity edema present.  Left lower extremity dressing present CNS: Alert; remains confused.  Still slow to respond and a poor historian.  No focal neurologic deficit.  Able to move extremities Lymph: No obvious palpable lymphadenopathy Skin: No obvious petechiae/rashes psych: Not agitated currently.  Affect is mostly flat musculoskeletal: No obvious joint tenderness/erythema     Data Reviewed: I have personally reviewed following labs and imaging studies  CBC: Recent Labs  Lab 06/18/24 1619 06/19/24 0215 06/20/24 1324 06/22/24 0548  WBC 13.0* 8.8 8.1 7.6  NEUTROABS 11.2*  --  6.0 5.1  HGB 13.1 13.0 12.0* 11.2*  HCT 41.7 40.4 36.5* 35.1*  MCV 89.9 88.6 87.3 88.2  PLT 151 154 145* 166   Basic Metabolic Panel: Recent Labs  Lab 06/18/24 1619 06/19/24 0215 06/20/24 0638 06/22/24 0548 06/23/24 0600  NA 138 140 140 139 142  K 5.2* 4.2 3.6 3.4* 3.8  CL 102 105 103 104 108  CO2 23 24 23 24  20*  GLUCOSE 180* 167* 138* 133* 140*  BUN 16 19 14 14 18   CREATININE 1.48* 1.46* 1.28* 1.28*  1.26*  CALCIUM  9.2 8.9 9.6 8.7* 8.7*  MG  --   --   --  2.1 2.1   GFR: Estimated Creatinine Clearance: 34.6 mL/min (A) (by C-G formula based on SCr of 1.26 mg/dL (H)). Liver Function Tests: Recent Labs  Lab 06/18/24 1619 06/19/24 0215  AST 51* 60*  ALT 38 38  ALKPHOS 74 76  BILITOT 1.2 1.0  PROT 7.4 7.0  ALBUMIN 4.0 3.9   Recent Labs  Lab 06/18/24 1619  LIPASE 40   No results for input(s): AMMONIA in the last 168 hours. Coagulation Profile: No results for input(s): INR, PROTIME in the last 168 hours. Cardiac Enzymes: No results for input(s): CKTOTAL, CKMB, CKMBINDEX, TROPONINI in the last 168 hours. BNP (last 3 results) No results for input(s): PROBNP in the  last 8760 hours. HbA1C: No results for input(s): HGBA1C in the last 72 hours.  CBG: Recent Labs  Lab 06/22/24 1206 06/22/24 1549 06/22/24 1724 06/22/24 2151 06/23/24 0803  GLUCAP 165* 97 116* 140* 134*   Lipid Profile: No results for input(s): CHOL, HDL, LDLCALC, TRIG, CHOLHDL, LDLDIRECT in the last 72 hours. Thyroid  Function Tests: No results for input(s): TSH, T4TOTAL, FREET4, T3FREE, THYROIDAB in the last 72 hours. Anemia Panel: No results for input(s): VITAMINB12, FOLATE, FERRITIN, TIBC, IRON, RETICCTPCT in the last 72 hours. Sepsis Labs: No results for input(s): PROCALCITON, LATICACIDVEN in the last 168 hours.  No results found for this or any previous visit (from the past 240 hours).       Radiology Studies: No results found.       Scheduled Meds:  acetaminophen   1,000 mg Oral Q8H   apixaban   5 mg Oral BID   dutasteride   0.5 mg Oral Daily   escitalopram   20 mg Oral QHS   hydrALAZINE   50 mg Oral Q8H   insulin  aspart  0-5 Units Subcutaneous QHS   insulin  aspart  0-9 Units Subcutaneous TID WC   metroNIDAZOLE   500 mg Oral Q12H   pantoprazole   40 mg Oral Daily   QUEtiapine   50 mg Oral Q24H   Continuous Infusions:  sodium  chloride 75 mL/hr at 06/23/24 0700   cefTRIAXone  (ROCEPHIN )  IV Stopped (06/22/24 2110)          Sophie Mao, MD [facility] 06/23/2024, 8:40 AM   "

## 2024-06-24 DIAGNOSIS — N179 Acute kidney failure, unspecified: Secondary | ICD-10-CM | POA: Diagnosis not present

## 2024-06-24 DIAGNOSIS — R531 Weakness: Secondary | ICD-10-CM | POA: Diagnosis not present

## 2024-06-24 DIAGNOSIS — Z7189 Other specified counseling: Secondary | ICD-10-CM | POA: Diagnosis not present

## 2024-06-24 DIAGNOSIS — Z515 Encounter for palliative care: Secondary | ICD-10-CM | POA: Diagnosis not present

## 2024-06-24 LAB — MAGNESIUM: Magnesium: 2.2 mg/dL (ref 1.7–2.4)

## 2024-06-24 LAB — CBC WITH DIFFERENTIAL/PLATELET
Abs Immature Granulocytes: 0.02 K/uL (ref 0.00–0.07)
Basophils Absolute: 0 K/uL (ref 0.0–0.1)
Basophils Relative: 1 %
Eosinophils Absolute: 0.6 K/uL — ABNORMAL HIGH (ref 0.0–0.5)
Eosinophils Relative: 8 %
HCT: 37.2 % — ABNORMAL LOW (ref 39.0–52.0)
Hemoglobin: 11.7 g/dL — ABNORMAL LOW (ref 13.0–17.0)
Immature Granulocytes: 0 %
Lymphocytes Relative: 17 %
Lymphs Abs: 1.2 K/uL (ref 0.7–4.0)
MCH: 28.5 pg (ref 26.0–34.0)
MCHC: 31.5 g/dL (ref 30.0–36.0)
MCV: 90.7 fL (ref 80.0–100.0)
Monocytes Absolute: 0.7 K/uL (ref 0.1–1.0)
Monocytes Relative: 9 %
Neutro Abs: 4.7 K/uL (ref 1.7–7.7)
Neutrophils Relative %: 65 %
Platelets: 197 K/uL (ref 150–400)
RBC: 4.1 MIL/uL — ABNORMAL LOW (ref 4.22–5.81)
RDW: 12.2 % (ref 11.5–15.5)
WBC: 7.3 K/uL (ref 4.0–10.5)
nRBC: 0 % (ref 0.0–0.2)

## 2024-06-24 LAB — BASIC METABOLIC PANEL WITH GFR
Anion gap: 12 (ref 5–15)
BUN: 14 mg/dL (ref 8–23)
CO2: 22 mmol/L (ref 22–32)
Calcium: 9.1 mg/dL (ref 8.9–10.3)
Chloride: 106 mmol/L (ref 98–111)
Creatinine, Ser: 1.11 mg/dL (ref 0.61–1.24)
GFR, Estimated: >60 mL/min
Glucose, Bld: 122 mg/dL — ABNORMAL HIGH (ref 70–99)
Potassium: 3.5 mmol/L (ref 3.5–5.1)
Sodium: 140 mmol/L (ref 135–145)

## 2024-06-24 LAB — GLUCOSE, CAPILLARY
Glucose-Capillary: 106 mg/dL — ABNORMAL HIGH (ref 70–99)
Glucose-Capillary: 117 mg/dL — ABNORMAL HIGH (ref 70–99)
Glucose-Capillary: 110 mg/dL — ABNORMAL HIGH (ref 70–99)
Glucose-Capillary: 122 mg/dL — ABNORMAL HIGH (ref 70–99)

## 2024-06-24 MED ORDER — IPRATROPIUM-ALBUTEROL 0.5-2.5 (3) MG/3ML IN SOLN
3.0000 mL | RESPIRATORY_TRACT | Status: DC | PRN
  Administered 2024-06-28: 3 mL via RESPIRATORY_TRACT
  Filled 2024-06-24: qty 3

## 2024-06-24 MED ORDER — SODIUM CHLORIDE 0.9 % IV SOLN
2.0000 g | Freq: Once | INTRAVENOUS | Status: AC
  Administered 2024-06-24: 2 g via INTRAVENOUS
  Filled 2024-06-24: qty 20

## 2024-06-24 NOTE — Progress Notes (Signed)
 Physical Therapy Treatment Patient Details Name: [name] MRN: [id number] DOB: 02-14-1928 Today's Date: 06/24/2024   History of Present Illness The patient is a 89 year old gentleman who presented to the emergency room 06/18/24 with  diarrhea and weakness, and  difficulty walking after an apparant mechanical fall.[name]  He was then admitted due to acute renal failure.  In the emergency room he was complaining of left ankle pain and was having difficulty with mobility and weightbearing on his left ankle.  A CT scan of the left ankle was obtained and it does show a trimalleolar ankle fracture. Treated with short leg posterior splint and NWB on the RLE    PT Comments  AxO x 3 pleasant and willing.  Visiting Cousin in room.  Conversing with eachother in Russian.  MUCH improved level of atertness and cognition with cessation of OPIODS. Pt ate all his lunch.  Removed tray.  Used I Printmaker during session.  Pt knows some English.  Assisted OOB to recliner required increased time and Instructions to ensure NWB L LE.  General bed mobility comments: increased time and HOB elevated, Pt did require assist for upper body supine to sit and assist to complete scooting to EOB.  Much improved cognition/alertness with reduction/ceasation of OPIODS. General transfer comment: 25% VC's on proper tech to push self up from bed using B UE's and 50% VC's to avoid L LE to touch floor NWBing.  50% VC's to use B UE's to support body weight using walker and proper tech to hop small steps 1/4 turn from bed to recliner.  Used + 2 assist for safety as well as a safety belt.  Pt did well to hold his L foot off floor but did need assist to complete turn to recliner. Positioned in recliner to comfort and elevated L LE on 2 pillows.  Pt reports pain a some.  Prior to admit, Pt was IND able to amb Per daughter patient has had some diarrhea and was walking to the bathroom when he had a fall following which he was finding  difficult to ambulate. Since patient had persistent symptoms he was brought to the ER(info taken from ED H&P).   LPT has rec [facility]hab before D/C back home with family. If Pt can improve his self ability to get in/OOB and self transfer, Family will be able to handle him at home.    If plan is discharge home, recommend the following: Two people to help with walking and/or transfers;A lot of help with bathing/dressing/bathroom;Assist for transportation;Help with stairs or ramp for entrance   Can travel by private vehicle        Equipment Recommendations  Wheelchair (measurements PT);Wheelchair cushion (measurements PT)    Recommendations for Other Services       Precautions / Restrictions Precautions Precaution/Restrictions Comments: non English Required Braces or Orthoses: Splint/Cast Splint/Cast: soft cast/ACE Restrictions Weight Bearing Restrictions Per Provider Order: Yes LLE Weight Bearing Per Provider Order: Non weight bearing     Mobility  Bed Mobility Overal bed mobility: Needs Assistance Bed Mobility: Supine to Sit     Supine to sit: Mod assist, +2 for physical assistance, +2 for safety/equipment, HOB elevated     General bed mobility comments: increased time and HOB elevated, Pt did require assist for upper body supine to sit and assist to complete scooting to EOB.  Much improved cognition/alertness with reduction/ceasation of OPIODS.    Transfers Overall transfer level: Needs assistance Equipment used: Rolling walker (2 wheels) Transfers: Sit  to/from Stand Sit to Stand: Min assist, Mod assist           General transfer comment: 25% VC's on proper tech to push self up from bed using B UE's and 50% VC's to avoid L LE to touch floor NWBing.  50% VC's to use B UE's to support body weight using walker and proper tech to hop small steps 1/4 turn from bed to recliner.  Used + 2 assist for safety as well as a safety belt.  Pt did well to hold his L foot off floor  but did need assist to complete turn to recliner.    Ambulation/Gait               General Gait Details: Transfer only this session due to Max c/o fatigue with just transfering to recliner.  Pt present with slight weezing.  No dizziness.  Dyspnea 2/4.   Stairs             Wheelchair Mobility     Tilt Bed    Modified Rankin (Stroke Patients Only)       Balance                                            Communication Communication Communication: No apparent difficulties  Cognition Arousal: Alert Behavior During Therapy: WFL for tasks assessed/performed   PT - Cognitive impairments: No apparent impairments                       PT - Cognition Comments: AxO x 3 pleasant and willing.  Visiting Cousin in room.  Conversing with eachother in Russian.  Pt ate all his lunch.  Removed tray.  Used I Printmaker during session.  Pt knows some English. Following commands: Intact Following commands impaired: Only follows one step commands consistently    Cueing Cueing Techniques: Verbal cues, Gestural cues  Exercises      General Comments        Pertinent Vitals/Pain Pain Assessment Pain Assessment: Faces Faces Pain Scale: Hurts a little bit Pain Location: L ankle Pain Descriptors / Indicators: Grimacing, Guarding Pain Intervention(s): Monitored during session, Repositioned    Home Living                          Prior Function            PT Goals (current goals can now be found in the care plan section) Progress towards PT goals: Progressing toward goals    Frequency    Min 2X/week      PT Plan      Co-evaluation              AM-PAC PT 6 Clicks Mobility   Outcome Measure  Help needed turning from your back to your side while in a flat bed without using bedrails?: A Lot Help needed moving from lying on your back to sitting on the side of a flat bed without using bedrails?: A Lot Help  needed moving to and from a bed to a chair (including a wheelchair)?: A Lot Help needed standing up from a chair using your arms (e.g., wheelchair or bedside chair)?: A Lot Help needed to walk in hospital room?: A Lot Help needed climbing 3-5 steps with a railing? : Total 6 Click Score: 11  End of Session Equipment Utilized During Treatment: Gait belt Activity Tolerance: Patient limited by fatigue Patient left: in chair;with call bell/phone within reach;with chair alarm set;with family/visitor present Nurse Communication: Mobility status PT Visit Diagnosis: Unsteadiness on feet (R26.81);Other abnormalities of gait and mobility (R26.89);Muscle weakness (generalized) (M62.81);History of falling (Z91.81);Pain Pain - Right/Left: Left Pain - part of body: Ankle and joints of foot     Time: 1351-1416 PT Time Calculation (min) (ACUTE ONLY): 25 min  Charges:    $Therapeutic Activity: 23-37 mins PT General Charges $$ ACUTE PT VISIT: 1 Visit                     Katheryn Leap  PTA Acute  Rehabilitation Services Office M-F          [phone]

## 2024-06-24 NOTE — Plan of Care (Signed)

## 2024-06-24 NOTE — Progress Notes (Signed)
 " PROGRESS NOTE    [name]  FMW:[id number] DOB: November 18, 1927 DOA: 06/18/2024 PCP: Rexanne Ingle, MD   Brief Narrative:   89 y.o. male with history of bioprosthetic aortic valve replacement status post TAVR, history of CVA on Eliquis , hypertension, BPH, rheumatoid arthritis, psoriasis on Cosentyx, liver cirrhosis presented with worsening weakness and difficulty walking.  On presentation, creatinine was 1.4 which had increased from normal in 2023 with WBCs of 13.  CT of the abdomen and pelvis showed possible sigmoid diverticulitis.  CT of the left ankle showed acute ankle fracture.  He was started on IV fluids and antibiotics.  Orthopedics was consulted and recommended conservative management.  Assessment & Plan:   Acute kidney injury - Likely from poor oral intake and recent diarrhea.  Presented with a creatinine of 1.48.  Creatinine improving to 1.11 today.  Monitor.  DC IV fluids.  Acute sigmoid diverticulitis - No diarrhea since admission.  DC'd stool testing and isolation.  Continue Rocephin  and Flagyl .  Possible aspiration pneumonia - Chest x-ray did not show any focal consolidation.  Diet as per SLP recommendations.  Continue current antibiotics  Left ankle fracture -Continue conservative management with splint as per orthopedics recommendations.  Outpatient follow-up with orthopedics.  Patient will need to be nonweightbearing on his left lower extremity for the next 4 to 6 weeks.  Delirium History of dementia - Patient has been started on scheduled Seroquel .  Monitor mental status.  Delirium precautions.  Awake this morning.  Hypertension - Continue hydralazine .  ARB on hold.  Monitor blood pressure  Hypokalemia - Resolved  History of bioprosthetic aortic valve replacement status post TAVR - Continue Eliquis .  Outpatient follow-up with cardiology  History of unspecified stroke - Continue Eliquis   Diabetes mellitus type 2 with hyperglycemia - A1c 7.0.  Continue CBGs with  SSI.  Carb modified diet  BPH - Continue dutasteride   History of psoriasis and rheumatoid arthritis - On Cosentyx.  Used to be on methotrexate  and as per daughter, this was discontinued because of cirrhosis  History of liver cirrhosis - No active issues at this time.  Outpatient follow-up with PCP and GI  Goals of care - Overall prognosis is guarded to poor.  Palliative care following.  Physical deconditioning - PT recommending SNF placement.  TOC following.  Patient is not a candidate for CIR as per CIR evaluation.  DVT prophylaxis: Eliquis  Code Status: DNR Family Communication: spoke to daughters Livia and Russ on phone on 06/23/2024  disposition Plan: Status is: Inpatient Remains inpatient appropriate because: Of severity of illness.  Need for SNF placement    Consultants: Orthopedics.   palliative care  Procedures: None  Antimicrobials:  Anti-infectives (From admission, onward)    Start     Dose/Rate Route Frequency Ordered Stop   06/21/24 1000  metroNIDAZOLE  (FLAGYL ) tablet 500 mg        500 mg Oral Every 12 hours 06/21/24 0801     06/19/24 2100  cefTRIAXone  (ROCEPHIN ) 2 g in sodium chloride  0.9 % 100 mL IVPB        2 g 200 mL/hr over 30 Minutes Intravenous Every 24 hours 06/19/24 0019     06/19/24 1000  metroNIDAZOLE  (FLAGYL ) IVPB 500 mg  Status:  Discontinued        500 mg 100 mL/hr over 60 Minutes Intravenous Every 12 hours 06/19/24 0019 06/21/24 0801   06/18/24 2115  cefTRIAXone  (ROCEPHIN ) 2 g in sodium chloride  0.9 % 100 mL IVPB  Placed in And Linked Group   2 g 200 mL/hr over 30 Minutes Intravenous  Once 06/18/24 2102 06/18/24 2153   06/18/24 2115  metroNIDAZOLE  (FLAGYL ) IVPB 500 mg       Placed in And Linked Group   500 mg 100 mL/hr over 60 Minutes Intravenous  Once 06/18/24 2102 06/18/24 2257        Subjective: Patient seen and examined at bedside.  Poor historian.  No agitation, seizures or seizures reported  objective: Vitals:    06/23/24 1353 06/23/24 2225 06/24/24 0705 06/24/24 0806  BP: (!) 169/75 (!) 192/60 (!) (P) 201/82 (!) 171/64  Pulse: 79 73 (P) 77 71  Resp: 18 20  18   Temp: 98.9 F (37.2 C) 99.2 F (37.3 C) (!) (P) 97.5 F (36.4 C)   TempSrc: Oral Oral (P) Oral   SpO2:  96%    Weight:      Height:        Intake/Output Summary (Last 24 hours) at 06/24/2024 0827 Last data filed at 06/23/2024 2236 Gross per 24 hour  Intake 919.34 ml  Output 625 ml  Net 294.34 ml   Filed Weights   06/20/24 2200 06/22/24 0454 06/23/24 0534  Weight: 85.6 kg 85.1 kg 85.1 kg    Examination:  General: On room air and in no distress currently ENT/neck: No palpable thyromegaly or elevated JVD noted  respiratory: Bilateral decreased breath sounds at bases with scattered crackles CVS: S1-S2 heard; rate controlled currently  abdominal: Soft, obese, slightly distended and tender; no organomegaly, bowel sounds normally heard  extremities: Bilateral lower extremity edema is present; no clubbing.  Left lower extremity dressing present CNS: Awake but confused.  Remains slow to respond and a poor historian.  No obvious focal neurologic deficit.   Lymph: No lymphadenopathy palpable  skin: No obvious lesions/ecchymosis  psych: Flat affect.  Currently not agitated musculoskeletal: No obvious joint swelling/deformity    Data Reviewed: I have personally reviewed following labs and imaging studies  CBC: Recent Labs  Lab 06/18/24 1619 06/19/24 0215 06/20/24 1324 06/22/24 0548 06/24/24 0532  WBC 13.0* 8.8 8.1 7.6 7.3  NEUTROABS 11.2*  --  6.0 5.1 4.7  HGB 13.1 13.0 12.0* 11.2* 11.7*  HCT 41.7 40.4 36.5* 35.1* 37.2*  MCV 89.9 88.6 87.3 88.2 90.7  PLT 151 154 145* 166 197   Basic Metabolic Panel: Recent Labs  Lab 06/19/24 0215 06/20/24 0638 06/22/24 0548 06/23/24 0600 06/24/24 0532  NA 140 140 139 142 140  K 4.2 3.6 3.4* 3.8 3.5  CL 105 103 104 108 106  CO2 24 23 24  20* 22  GLUCOSE 167* 138* 133* 140* 122*   BUN 19 14 14 18 14   CREATININE 1.46* 1.28* 1.28* 1.26* 1.11  CALCIUM  8.9 9.6 8.7* 8.7* 9.1  MG  --   --  2.1 2.1 2.2   GFR: Estimated Creatinine Clearance: 39.3 mL/min (by C-G formula based on SCr of 1.11 mg/dL). Liver Function Tests: Recent Labs  Lab 06/18/24 1619 06/19/24 0215  AST 51* 60*  ALT 38 38  ALKPHOS 74 76  BILITOT 1.2 1.0  PROT 7.4 7.0  ALBUMIN 4.0 3.9   Recent Labs  Lab 06/18/24 1619  LIPASE 40   No results for input(s): AMMONIA in the last 168 hours. Coagulation Profile: No results for input(s): INR, PROTIME in the last 168 hours. Cardiac Enzymes: No results for input(s): CKTOTAL, CKMB, CKMBINDEX, TROPONINI in the last 168 hours. BNP (last 3 results) No results for input(s): PROBNP  in the last 8760 hours. HbA1C: No results for input(s): HGBA1C in the last 72 hours.  CBG: Recent Labs  Lab 06/23/24 0803 06/23/24 1106 06/23/24 1626 06/23/24 2222 06/24/24 0809  GLUCAP 134* 159* 131* 108* 106*   Lipid Profile: No results for input(s): CHOL, HDL, LDLCALC, TRIG, CHOLHDL, LDLDIRECT in the last 72 hours. Thyroid  Function Tests: No results for input(s): TSH, T4TOTAL, FREET4, T3FREE, THYROIDAB in the last 72 hours. Anemia Panel: No results for input(s): VITAMINB12, FOLATE, FERRITIN, TIBC, IRON, RETICCTPCT in the last 72 hours. Sepsis Labs: No results for input(s): PROCALCITON, LATICACIDVEN in the last 168 hours.  No results found for this or any previous visit (from the past 240 hours).       Radiology Studies: No results found.       Scheduled Meds:  acetaminophen   1,000 mg Oral Q8H   apixaban   5 mg Oral BID   dutasteride   0.5 mg Oral Daily   escitalopram   20 mg Oral QHS   hydrALAZINE   50 mg Oral Q8H   insulin  aspart  0-5 Units Subcutaneous QHS   insulin  aspart  0-9 Units Subcutaneous TID WC   metroNIDAZOLE   500 mg Oral Q12H   pantoprazole   40 mg Oral Daily   psyllium  1 packet  Oral Daily   QUEtiapine   50 mg Oral Q24H   senna-docusate  1 tablet Oral BID   Continuous Infusions:  sodium chloride  75 mL/hr at 06/23/24 2208   cefTRIAXone  (ROCEPHIN )  IV 2 g (06/23/24 2216)          Sophie Mao, MD [facility] 06/24/2024, 8:27 AM   "

## 2024-06-24 NOTE — Progress Notes (Signed)
 "                                                                                                                                                                                                            Daily Progress Note   Patient Name: [name]       Date: 06/24/2024 DOB: 04/30/1928  Age: 89 y.o. MRN#: [id number] Attending Physician: Cheryle Page, MD Primary Care Physician: Rexanne Ingle, MD Admit Date: 06/18/2024  Reason for Consultation/Follow-up: Establishing goals of care and Pain control  Subjective: Patient is resting in bed, daughter Russ at bedside, patient has been resting since am, daughter says that he feels very tired. Pain in ankle is better controlled, patient/family wish to opioids as much as possible, hence he is on scheduled PO Tylenol  for pain and tolerating it well.    Length of Stay: 5  Current Medications: Scheduled Meds:   acetaminophen   1,000 mg Oral Q8H   apixaban   5 mg Oral BID   dutasteride   0.5 mg Oral Daily   escitalopram   20 mg Oral QHS   hydrALAZINE   50 mg Oral Q8H   insulin  aspart  0-5 Units Subcutaneous QHS   insulin  aspart  0-9 Units Subcutaneous TID WC   metroNIDAZOLE   500 mg Oral Q12H   pantoprazole   40 mg Oral Daily   psyllium  1 packet Oral Daily   QUEtiapine   50 mg Oral Q24H   senna-docusate  1 tablet Oral BID    Continuous Infusions:  sodium chloride  75 mL/hr at 06/23/24 2208   cefTRIAXone  (ROCEPHIN )  IV 2 g (06/23/24 2216)    PRN Meds: melatonin, oxyCODONE   Physical Exam         Resting in bed No distress, appears to be resting peacefully   L LE in splint/dressing Abdomen not distended Regular work of breathing.   Vital Signs: BP (!) 171/64 (BP Location: Right Arm)   Pulse 71   Temp (!) (P) 97.5 F (36.4 C) (Oral)   Resp 18   Ht 5' 3 (1.6 m)   Wt 85.1 kg   SpO2 96%   BMI 33.23 kg/m  SpO2: SpO2: 96 % O2 Device: O2 Device: Room Air O2 Flow Rate:    Intake/output summary:  Intake/Output Summary (Last 24  hours) at 06/24/2024 1010 Last data filed at 06/23/2024 2236 Gross per 24 hour  Intake 919.34 ml  Output 625 ml  Net 294.34 ml   LBM: Last BM Date : 06/23/24 Baseline Weight: Weight: 85.6 kg Most recent weight: Weight: 85.1 kg  Palliative Assessment/Data:      Patient Active Problem List   Diagnosis Date Noted   General weakness 06/22/2024   Goals of care, counseling/discussion 06/22/2024   ARF (acute renal failure) 06/19/2024   History of stroke 06/19/2024   Closed left ankle fracture 06/19/2024   Closed nondisplaced trimalleolar fracture of left lower leg 06/19/2024   Diverticulitis 06/18/2024   Pain due to onychomycosis of toenails of both feet 07/21/2022   Dermatitis 07/21/2022   Psoriatic arthritis of multiple joints (HCC) 04/03/2022   Palliative care by specialist 02/20/2022   Difficulty swallowing pills 02/20/2022   CVA (cerebral vascular accident) (HCC) 12/25/2021   Symptomatic severe aortic stenosis with normal ejection fraction 12/21/2021   Near syncope 12/20/2021   Dementia without behavioral disturbance, psychotic disturbance, mood disturbance, or anxiety (HCC) 12/20/2021   Unspecified cirrhosis of liver (HCC) 12/20/2021   Hyperbilirubinemia 12/20/2021   Syncope 10/04/2019   Abdominal pain 10/09/2018   CKD (chronic kidney disease), stage II 10/09/2018   RUQ pain    Elevated LFTs 09/22/2018   Inguinal hernia of right side without obstruction or gangrene 08/22/2018   Chronic diastolic CHF (congestive heart failure) (HCC) 07/24/2018   Left-sided carotid artery disease 07/24/2018   Chest pain 06/05/2018   S/P TAVR (transcatheter aortic valve replacement) 03/27/2018   RA (rheumatoid arthritis) (HCC)    HOH (hard of hearing)    GERD (gastroesophageal reflux disease)    Benign prostatic hyperplasia    HLD (hyperlipidemia)    Depression    Diverticulosis 02/07/2018   Severe aortic stenosis    Benign essential HTN    Musculoskeletal chest pain 08/20/2016    Neoplasm of uncertain behavior of thyroid  gland, 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   Sinus bradycardia 06/20/2014    Palliative Care Assessment & Plan        89 year old man with advanced multimorbidity, including prior TAVR, remote CVA, cirrhosis, inflammatory arthritis, and baseline cognitive impairment, admitted with acute on chronic functional decline following a left ankle fracture. He is currently unable to ambulate safely, as he has to be non weight bearing, and his decline is multifactorial, related to advanced age, frailty, acute orthopedic injury, and significant ongoing deconditioning.  Orthopedic surgery has recommended nonoperative management of the left ankle fracture with splinting and strict non-weight-bearing of the left lower extremity for approximately 4-6 weeks, with outpatient follow-up. Daughter Russ at bedside today as questions regarding this recommendation, relayes her concerns to [facility] MD.   Pain is being managed with scheduled acetaminophen  1000 mg three times daily and low-dose oral oxycodone  IR 2.5 mg as needed, as last resort as patient/family wish to avoid opioid use.     He remains on escitalopram  20 mg daily for chronic mood and anxiety symptoms, with no evidence of acute agitation or distress at this time.  From a functional and disposition standpoint, the patient has experienced profound decline and is now strictly non-weight-bearing, significantly limiting rehabilitation potential. After multidisciplinary assessment, he is not considered a candidate for intensive inpatient rehabilitation due to medical complexity, frailty, and limited physiologic reserve. CIR denial was re discussed again. Family states that he had successfully completed a CIR rehab attempt in 2018. TOC notes also reflect the new plan for SNF rehab search that is underway. Recommend outpatient palliative care at next location.     Medical decision-making was of high complexity today, given detailed  discussions that were held at the bedside with daughter Inna, given management of multiple chronic conditions with acute exacerbation, recent orthopedic injury  with functional consequences, ongoing symptom management requiring medication oversight, and high risk for further decline. The patients advanced age, frailty, cognitive impairment, and non-weight-bearing status place him at increased risk for complications including falls, delirium, immobility-related morbidity, and failure to recover baseline function. This was frankly but compassionately discussed with daughter at bedside.   Code Status:    Code Status Orders  (From admission, onward)           Start     Ordered   06/19/24 0016  Do not attempt resuscitation (DNR)- Limited -Do Not Intubate (DNI)  Continuous       Question Answer Comment  If pulseless and not breathing No CPR or chest compressions.   In Pre-Arrest Conditions (Patient Is Breathing and Has A Pulse) Do not intubate. Provide all appropriate non-invasive medical interventions. Avoid ICU transfer unless indicated or required.   Consent: Discussion documented in EHR or advanced directives reviewed      06/19/24 0018           Code Status History     Date Active Date Inactive Code Status Order ID Comments User Context   12/20/2021 0509 12/26/2021 1703 Full Code [id number]  Charlton Evalene RAMAN, MD ED   10/04/2019 2354 10/05/2019 2328 Full Code [id number]  Ricky Alfrieda DASEN, DO ED   10/09/2018 2006 10/13/2018 0008 DNR [id number]  Hilma Rankins, MD ED   09/22/2018 1544 09/26/2018 1905 DNR [id number]  Nikki Hansel Atlas, MD ED   06/04/2018 2340 06/05/2018 1542 Full Code [id number]  Martie Gatha ORN, MD Inpatient   03/27/2018 1206 03/28/2018 1518 Full Code [id number]  Sebastian Lamarr SAUNDERS, PA-C Inpatient   02/04/2018 1537 02/07/2018 2109 Full Code [id number]  Leopold Damien NOVAK, MD Inpatient   08/28/2016 1218 09/06/2016 1711 Full Code [id number]  Pegge Toribio PARAS, PA-C Inpatient   08/28/2016 1218  08/28/2016 1218 Full Code [id number]  Pegge Toribio PARAS, PA-C Inpatient   08/13/2016 2032 08/28/2016 1216 Full Code [id number]  Ebbie Cough, MD Inpatient   05/05/2015 1142 05/06/2015 1303 Full Code [id number]  Eletha Boas, MD Inpatient       Prognosis:  Guarded.   Discharge Planning: Skilled Nursing Facility for rehab with Palliative care service follow-up  Care plan was discussed with  patient and daughter Russ at bedside.  Also secure chat correspondence with [facility] MD.   Thank you for allowing the Palliative Medicine Team to assist in the care of this patient. High MDM.     Greater than 50%  of this time was spent counseling and coordinating care related to the above assessment and plan.  Lonia Serve, MD  Please contact Palliative Medicine Team phone at [phone] for questions and concerns.       "

## 2024-06-24 NOTE — Plan of Care (Signed)
" °  Problem: Coping: Goal: Ability to adjust to condition or change in health will improve Outcome: Progressing   Problem: Nutritional: Goal: Maintenance of adequate nutrition will improve Outcome: Progressing   Problem: Skin Integrity: Goal: Risk for impaired skin integrity will decrease Outcome: Progressing   Problem: Activity: Goal: Risk for activity intolerance will decrease Outcome: Progressing   Problem: Nutrition: Goal: Adequate nutrition will be maintained Outcome: Progressing   Problem: Coping: Goal: Level of anxiety will decrease Outcome: Progressing   Problem: Elimination: Goal: Will not experience complications related to bowel motility Outcome: Progressing   Problem: Pain Managment: Goal: General experience of comfort will improve and/or be controlled Outcome: Progressing   Problem: Safety: Goal: Ability to remain free from injury will improve Outcome: Progressing   "

## 2024-06-24 NOTE — TOC Progression Note (Addendum)
 Transition of Care [facility]) - Progression Note    Patient Details  Name: [name] MRN: [id number] Date of Birth: 04-Jun-1928  [facility]) CM/SW Contact  Ryan DELENA Saltness, [name] Phone Number: 06/24/2024, 9:31 AM  Clinical Narrative:     ADDENDUM  1442 - CSW sent updated PT note to [facility] for review.  9:57 AM - CSW spoke with pt's daughter, [name] [phone], via phone call to provide update. CSW explained that per CIR admissions coordinator, [name], CIR tracks all of their insurance requests and [name] has a 100% denial rate for all ortho cases they have submitted to them in the last year. CSW advised [facility] in [place] is reviewing referral for potential admission. Daughter reports understanding. TOC will continue to follow.  CSW spoke to [name], admissions coordinator, at [facility]. [name] reports she will review referral for admission once PT has completed evaluation today. CSW will send PT note once session is completed. TOC will continue to follow.   Expected Discharge Plan: Home w Home Health Services Barriers to Discharge: Continued Medical Work up   Expected Discharge Plan and Services In-house Referral: Clinical Social Work Discharge Planning Services: NA Post Acute Care Choice: Resumption of Svcs/PTA Provider, IP Rehab Living arrangements for the past 2 months: Single Family Home                 DME Arranged: N/A DME Agency: NA       HH Arranged: NA HH Agency: NA         Social Drivers of Health (SDOH) Interventions SDOH Screenings   Food Insecurity: No Food Insecurity (06/19/2024)  Housing: Low Risk (06/19/2024)  Transportation Needs: No Transportation Needs (06/19/2024)  Utilities: Not At Risk (06/19/2024)  Social Connections: Patient Unable To Answer (06/19/2024)  Tobacco Use: Medium Risk (06/19/2024)    Readmission Risk Interventions    06/20/2024    2:21 PM  Readmission Risk Prevention Plan   Transportation Screening Complete  PCP or Specialist Appt within 5-7 Days Complete  Home Care Screening Complete  Medication Review (RN CM) Complete    Signed: [name], MSW, [name] Clinical Social Worker Inpatient Care Management 06/24/2024 9:33 AM

## 2024-06-25 ENCOUNTER — Ambulatory Visit: Admitting: Podiatry

## 2024-06-25 DIAGNOSIS — Z79899 Other long term (current) drug therapy: Secondary | ICD-10-CM

## 2024-06-25 DIAGNOSIS — Z7189 Other specified counseling: Secondary | ICD-10-CM

## 2024-06-25 LAB — GLUCOSE, CAPILLARY
Glucose-Capillary: 122 mg/dL — ABNORMAL HIGH (ref 70–99)
Glucose-Capillary: 192 mg/dL — ABNORMAL HIGH (ref 70–99)
Glucose-Capillary: 134 mg/dL — ABNORMAL HIGH (ref 70–99)
Glucose-Capillary: 87 mg/dL (ref 70–99)

## 2024-06-25 LAB — BASIC METABOLIC PANEL WITH GFR
Anion gap: 10 (ref 5–15)
BUN: 13 mg/dL (ref 8–23)
CO2: 24 mmol/L (ref 22–32)
Calcium: 9.1 mg/dL (ref 8.9–10.3)
Chloride: 105 mmol/L (ref 98–111)
Creatinine, Ser: 1.08 mg/dL (ref 0.61–1.24)
GFR, Estimated: >60 mL/min
Glucose, Bld: 127 mg/dL — ABNORMAL HIGH (ref 70–99)
Potassium: 3.8 mmol/L (ref 3.5–5.1)
Sodium: 139 mmol/L (ref 135–145)

## 2024-06-25 LAB — MAGNESIUM: Magnesium: 2.1 mg/dL (ref 1.7–2.4)

## 2024-06-25 NOTE — TOC Progression Note (Addendum)
 Transition of Care [facility]) - Progression Note    Patient Details  Name: [name] MRN: [id number] Date of Birth: 1927/10/27  Transition of Care [facility]) CM/SW Contact  Heather DELENA Saltness, LCSW Phone Number: 06/25/2024, 8:59 AM  Clinical Narrative:     ADDENDUM  CSW received follow up from [facility] admissions coordinator, [name], stating that pt's case was staffed with two attending providers, who state pt is not appropriate for CIR and [employer] would likely deny. CSW spoke with daughter, Emeka Lindner, to provide update. CSW offered to send referral to [facility]. Daughter stated I understand that [employer] most likely will deny. However, I can't appeal their denial if the insurance is not submitted. Let's submit the insurance and I talk to [facility] directly. Without the insurance referral, it is impossible to start the conversation. CSW relayed daughter's message to admissions coordinator, [name], requesting CIR to follow up with pt's daughter.  CSW received text message from pt's daughter, Kayode Petion [phone], stating she has read about [facility] online and I don't want my father to go there. I request to submit referral to [facility]. I will be arguing with [employer] to put him there. CSW spoke with CIR admissions coordinator, [name], to inform of pt's daughter's request. Reche states she will discuss with MD and see if they can submit insurance authorization for CIR. Pt's daughter reports understanding. TOC will continue to follow.   Expected Discharge Plan: Home w Home Health Services Barriers to Discharge: Continued Medical Work up  Expected Discharge Plan and Services In-house Referral: Clinical Social Work Discharge Planning Services: NA Post Acute Care Choice: Resumption of Svcs/PTA Provider, IP Rehab Living arrangements for the past 2 months: Single Family Home                 DME Arranged: N/A DME Agency: NA       HH Arranged:  NA HH Agency: NA         Social Drivers of Health (SDOH) Interventions SDOH Screenings   Food Insecurity: No Food Insecurity (06/19/2024)  Housing: Low Risk (06/19/2024)  Transportation Needs: No Transportation Needs (06/19/2024)  Utilities: Not At Risk (06/19/2024)  Social Connections: Patient Unable To Answer (06/19/2024)  Tobacco Use: Medium Risk (06/19/2024)    Readmission Risk Interventions    06/20/2024    2:21 PM  Readmission Risk Prevention Plan  Transportation Screening Complete  PCP or Specialist Appt within 5-7 Days Complete  Home Care Screening Complete  Medication Review (RN CM) Complete    Signed: Heather Saltness, MSW, LCSW Clinical Social Worker Inpatient Care Management 06/25/2024 9:06 AM

## 2024-06-25 NOTE — Progress Notes (Signed)
 Inpatient Rehab Admissions Coordinator:   Notified by [facility] that pt's daughter continues to desire CIR at [facility].  I reviewed case again.  Pt hospital course remains stable from my note on 1/9.  I also reviewed with rehab MDs (Dr. Cornelio, and medical director Dr. Babs), both of whom agree that pt does not meet criteria for acute inpatient rehab and would not get insurance approval.  I let them know that, per my discussion with RNCM, [facility] had agreed to try and get auth from [facility].  If they are able to obtain authorization we could consider transferring the auth to [facility].  I let TOC know.  [name], PT, DPT Admissions Coordinator [phone] 06/25/2024 9:46 AM

## 2024-06-25 NOTE — Progress Notes (Signed)
 Occupational Therapy Treatment Patient Details Name: [name] MRN: [id number] DOB: September 23, 1927 Today's Date: 06/25/2024   History of present illness The patient is a 89 year old gentleman who presented to the emergency room 06/18/24 with  diarrhea and weakness, and  difficulty walking after an apparant mechanical fall.SABRA  He was then admitted due to acute renal failure.  In the emergency room he was complaining of left ankle pain and was having difficulty with mobility and weightbearing on his left ankle.  A CT scan of the left ankle was obtained and it does show a trimalleolar ankle fracture. Treated with short leg posterior splint and NWB on the RLE   OT comments  The pt was seen for functional strengthening and progression of functional activity, including ADLs. He required mod assist for supine to sit. He presented with fair sitting balance EOB and subsequently needed max assist for lower body dressing/sock management. Mod assist was then needed for sit to stand & for the pt to perform a stand-pivot transfer to the chair using a RW. He presented with good ability to maintain LLE NWB in static standing, through he needed intermittent cues and light assist to maintain during dynamic standing. Overall, he was much more alert and able to actively engage in the session this date; he put forth good effort. He will continue to benefit from OT services to maximize his ADL performance and to address his overall functional limitations. Patient will benefit from intensive inpatient follow-up therapy, >3 hours/day.       If plan is discharge home, recommend the following:  A lot of help with walking and/or transfers;A lot of help with bathing/dressing/bathroom;Assistance with cooking/housework;Assist for transportation;Help with stairs or ramp for entrance   Equipment Recommendations  Other (comment) (defer to next setting)    Recommendations for Other Services      Precautions / Restrictions  Precautions Precautions: Fall Required Braces or Orthoses: Splint/Cast Restrictions Weight Bearing Restrictions Per Provider Order: Yes LLE Weight Bearing Per Provider Order: Non weight bearing       Mobility Bed Mobility Overal bed mobility: Needs Assistance Bed Mobility: Supine to Sit     Supine to sit: Mod assist, HOB elevated     General bed mobility comments: required assist for LLE advancement off the bed and for trunk    Transfers Overall transfer level: Needs assistance Equipment used: Rolling walker (2 wheels) Transfers: Sit to/from Stand Sit to Stand: Mod assist Stand pivot transfers: Mod assist         General transfer comment: Pt was instructed on a stand pivot transfer to the chair using a RW. He required intermittent cues for LLE NWB, as well as for walker advancement and reaching back prior to sitting     Balance     Sitting balance-Leahy Scale: Fair       Standing balance-Leahy Scale: Poor            ADL either performed or assessed with clinical judgement   ADL Overall ADL's : Needs assistance/impaired     Grooming: Set up;Sitting               Lower Body Dressing: Maximal assistance;Sitting/lateral leans Lower Body Dressing Details (indicate cue type and reason): for donning his R sock seated EOB                              Communication Communication Communication: No apparent difficulties Factors Affecting Communication:  (Pt  speaks Russian. His daughter was present and interpreted throughout session)   Cognition Arousal: Alert Behavior During Therapy: WFL for tasks assessed/performed        Following commands: Intact Following commands impaired: Only follows one step commands consistently      Cueing   Cueing Techniques: Verbal cues, Gestural cues             Pertinent Vitals/ Pain       Pain Assessment Pain Assessment: Faces Pain Score: 4  Pain Intervention(s): Limited activity within patient's  tolerance, Monitored during session, Repositioned   Frequency  Min 2X/week        Progress Toward Goals  OT Goals(current goals can now be found in the care plan section)     Acute Rehab OT Goals Patient Stated Goal: pt's daughter desires for the pt to go to inpatient rehab OT Goal Formulation: With patient/family Time For Goal Achievement: 07/04/24 Potential to Achieve Goals: Good  Plan         AM-PAC OT 6 Clicks Daily Activity     Outcome Measure   Help from another person eating meals?: A Little Help from another person taking care of personal grooming?: A Little Help from another person toileting, which includes using toliet, bedpan, or urinal?: A Lot Help from another person bathing (including washing, rinsing, drying)?: A Lot Help from another person to put on and taking off regular upper body clothing?: A Little Help from another person to put on and taking off regular lower body clothing?: A Lot 6 Click Score: 15    End of Session Equipment Utilized During Treatment: Gait belt;Rolling walker (2 wheels)  OT Visit Diagnosis: Muscle weakness (generalized) (M62.81);History of falling (Z91.81);Other abnormalities of gait and mobility (R26.89);Unsteadiness on feet (R26.81)   Activity Tolerance Patient tolerated treatment well   Patient Left in chair;with call bell/phone within reach;with family/visitor present   Nurse Communication Mobility status        Time: 8366-8343 OT Time Calculation (min): 23 min  Charges: OT General Charges $OT Visit: 1 Visit OT Treatments $Therapeutic Activity: 23-37 mins    Delanna JINNY Lesches, OTR/L 06/25/2024, 6:01 PM

## 2024-06-25 NOTE — Plan of Care (Signed)
   Problem: Coping: Goal: Ability to adjust to condition or change in health will improve Outcome: Progressing   Problem: Fluid Volume: Goal: Ability to maintain a balanced intake and output will improve Outcome: Progressing   Problem: Metabolic: Goal: Ability to maintain appropriate glucose levels will improve Outcome: Progressing   Problem: Nutritional: Goal: Maintenance of adequate nutrition will improve Outcome: Progressing   Problem: Tissue Perfusion: Goal: Adequacy of tissue perfusion will improve Outcome: Progressing

## 2024-06-25 NOTE — Progress Notes (Signed)
 " Daily Progress Note   Patient Name: [name]       Date: 06/25/2024 DOB: 07-05-1927  Age: 89 y.o. MRN#: [id number] Attending Physician: Ryan Page, MD Primary Care Physician: Ryan Ingle, MD Admit Date: 06/18/2024 Length of Stay: 6 days  Reason for Follow-up: Establishing goals of care  Subjective:   CC: Patient without acute concerns.  Palliative medicine team following up regarding goals of care.  Reviewed EMR including documentation today from hospitalist, TOC, CIR coordinator, and SLP.  Hospitalist noted patient has completed a week of antibiotics and so therefore will be appropriately discontinued.  Hospitalist noting patient is currently medically stable for discharge.  Reviewed SLP note noting that patient does not have any aspiration limitations and is allowed to have thin liquids and regular age-appropriate diet; no SLP interventions recommended.  CIR has reviewed patient's case and patient has been denied.  TOC continuing discussions with daughter regarding CIR admission which is specifically requested by family. At time of EMR review on past 24 hours, patient has not required any as needed oxycodone .  Patient continues to receive oral Tylenol  1000 mg every 8 hours during the day.  Presented to bedside to see patient.  No visitors present at bedside.  Patient laying comfortably in bed with interpreter tablet at bedside.  No concerns noted at this time.  Palliative medicine team continuing to follow along with patient's medical journey.  Objective:   Vital Signs:  BP (!) 186/69 (BP Location: Right Arm)   Pulse 71   Temp 97.8 F (36.6 C) (Oral)   Resp 19   Ht 5' 3 (1.6 m)   Wt 85.1 kg   SpO2 97%   BMI 33.23 kg/m   Physical Exam: General: NAD, alert Eyes: conjunctiva clear, anicteric sclera HENT: normocephalic, atraumatic, moist mucous membranes Cardiovascular: RRR, no edema in LE b/l Respiratory: no increased work of breathing noted, not in respiratory  distress Abdomen: not distended Extremities:  Skin: no rashes or lesions on visible skin Neuro: A&Ox4, following commands easily Psych: appropriately answers all questions  Assessment & Plan:   Assessment: Patient is a 89 year old male with a history of bioprosthetic aortic valve replacement status post TAVR, history of CVA on Eliquis , hypertension, BPH, rheumatoid arthritis, psoriasis on Cosentyx, and liver cirrhosis who was admitted on 06/18/2024 for management of worsening weakness and difficulty walking.  During hospitalization was found patient had possible sigmoid diverticulitis on CT abdomen and CT of the left ankle showed acute ankle fracture.  Orthopedics was consulted and recommended conservative management.  Palliative medicine team was consulted for goals of care.  Recommendations/Plan: # Complex medical decision making/goals of care:  - Currently continuing appropriate medical interventions.  Family working with TOC to determine discharge planning.  Family requesting CIR though patient has not been accepted by admissions at [facility].  Possibly going to appeal denial.  As per hospitalist patient is medically appropriate for discharge.  Palliative medicine team continuing to follow along with patient's medical journey.  -  Code Status: Limited: Do not attempt resuscitation (DNR) -DNR-LIMITED -Do Not Intubate/DNI   # Symptom management: Patient is receiving these palliative interventions for symptom management with an intent to improve quality of life.   - Pain, acute in setting of left ankle fracture   - Continue p.o. Tylenol  1000 mg every 8 hours during the day   - Continue p.o. oxycodone  2.5 mg every 4 hours as needed.  Family wishes to avoid opioids if able.  # Psychosocial Support:  -  Daughters: [name] and [name]  # Discharge Planning: To Be Determined  - TOC assisting with discharge coordination.  Family requesting CIR.  Thank you for allowing the palliative care team to participate  in the care [name].  Ryan Radar, DO Palliative Care Provider PMT # [phone]  If patient remains symptomatic despite maximum doses, please call PMT at [phone] between 0700 and 1900. Outside of these hours, please call attending, as PMT does not have night coverage.  Personally spent 25 minutes in patient care including extensive chart review (labs, imaging, progress/consult notes, vital signs), medically appropraite exam, discussed with treatment team, education to patient, family, and staff, documenting clinical information, medication review and management, coordination of care, and available advanced directive documents. ]  "

## 2024-06-25 NOTE — Progress Notes (Signed)
 " PROGRESS NOTE    Hughey Rittenberry  FMW:[id number] DOB: June 06, 1928 DOA: 06/18/2024 PCP: Rexanne Ingle, MD   Brief Narrative:   89 y.o. male with history of bioprosthetic aortic valve replacement status post TAVR, history of CVA on Eliquis , hypertension, BPH, rheumatoid arthritis, psoriasis on Cosentyx, liver cirrhosis presented with worsening weakness and difficulty walking.  On presentation, creatinine was 1.4 which had increased from normal in 2023 with WBCs of 13.  CT of the abdomen and pelvis showed possible sigmoid diverticulitis.  CT of the left ankle showed acute ankle fracture.  He was started on IV fluids and antibiotics.  Orthopedics was consulted and recommended conservative management.  PT recommended SNF placement.  Family interested in CIR.  Assessment & Plan:   Acute kidney injury - Likely from poor oral intake and recent diarrhea.  Presented with a creatinine of 1.48.  Creatinine improving to 1.08 today.  Monitor.  Off IV fluids.  Acute sigmoid diverticulitis - No diarrhea since admission.  DC'd stool testing and isolation.  Completed 1 week of IV Rocephin  and Flagyl  and subsequently discontinued.  Possible aspiration pneumonia - Chest x-ray did not show any focal consolidation.  Diet as per SLP recommendations.  Completed a week of antibiotics.  Left ankle fracture -Continue conservative management with splint as per orthopedics recommendations.  Outpatient follow-up with orthopedics.  Patient will need to be nonweightbearing on his left lower extremity for the next 4 to 6 weeks.  Delirium History of dementia - Patient has been started on scheduled Seroquel .  Monitor mental status.  Delirium precautions.  Awake this morning.  Hypertension - Continue hydralazine .  ARB on hold.  Monitor blood pressure.  If blood pressure remains intermittently elevated, might have to resume ARB.  Hypokalemia - Resolved  History of bioprosthetic aortic valve replacement status post TAVR -  Continue Eliquis .  Outpatient follow-up with cardiology  History of unspecified stroke - Continue Eliquis   Diabetes mellitus type 2 with hyperglycemia - A1c 7.0.  Continue CBGs with SSI.  Carb modified diet  BPH - Continue dutasteride   History of psoriasis and rheumatoid arthritis - On Cosentyx.  Used to be on methotrexate  and as per daughter, this was discontinued because of cirrhosis  History of liver cirrhosis - No active issues at this time.  Outpatient follow-up with PCP and GI  Goals of care - Overall prognosis is guarded to poor.  Palliative care following.  Physical deconditioning - PT recommending SNF placement.  TOC following.  Family still interested in CIR placement.  Patient is not a candidate for CIR as per CIR evaluation.  DVT prophylaxis: Eliquis  Code Status: DNR Family Communication: spoke to daughters Livia and Russ on phone on 06/23/2024  disposition Plan: Status is: Inpatient Remains inpatient appropriate because: Of severity of illness.  Need for SNF placement.  Currently medically stable for discharge.      Consultants: Orthopedics.   palliative care  Procedures: None  Antimicrobials:  Anti-infectives (From admission, onward)    Start     Dose/Rate Route Frequency Ordered Stop   06/24/24 2200  cefTRIAXone  (ROCEPHIN ) 2 g in sodium chloride  0.9 % 100 mL IVPB        2 g 200 mL/hr over 30 Minutes Intravenous  Once 06/24/24 2108 06/24/24 2234   06/21/24 1000  metroNIDAZOLE  (FLAGYL ) tablet 500 mg        500 mg Oral Every 12 hours 06/21/24 0801 06/24/24 2152   06/19/24 2100  cefTRIAXone  (ROCEPHIN ) 2 g in sodium chloride  0.9 %  100 mL IVPB  Status:  Discontinued        2 g 200 mL/hr over 30 Minutes Intravenous Every 24 hours 06/19/24 0019 06/24/24 2108   06/19/24 1000  metroNIDAZOLE  (FLAGYL ) IVPB 500 mg  Status:  Discontinued        500 mg 100 mL/hr over 60 Minutes Intravenous Every 12 hours 06/19/24 0019 06/21/24 0801   06/18/24 2115  cefTRIAXone   (ROCEPHIN ) 2 g in sodium chloride  0.9 % 100 mL IVPB       Placed in And Linked Group   2 g 200 mL/hr over 30 Minutes Intravenous  Once 06/18/24 2102 06/18/24 2153   06/18/24 2115  metroNIDAZOLE  (FLAGYL ) IVPB 500 mg       Placed in And Linked Group   500 mg 100 mL/hr over 60 Minutes Intravenous  Once 06/18/24 2102 06/18/24 2257        Subjective: Patient seen and examined at bedside.  Poor historian.  No  fever, vomiting, agitation reported  Currently objective: Vitals:   06/24/24 1716 06/24/24 2032 06/25/24 0500 06/25/24 0702  BP: (!) 170/63 (!) 163/53 (!) 186/69   Pulse: 72 81 71   Resp:  18 19   Temp: 97.8 F (36.6 C) 99.1 F (37.3 C) 97.8 F (36.6 C)   TempSrc: Oral Oral Oral   SpO2: 97% 96% 97%   Weight:    84.7 kg  Height:        Intake/Output Summary (Last 24 hours) at 06/25/2024 0836 Last data filed at 06/24/2024 1400 Gross per 24 hour  Intake 220 ml  Output 400 ml  Net -180 ml   Filed Weights   06/22/24 0454 06/23/24 0534 06/25/24 0702  Weight: 85.1 kg 85.1 kg 84.7 kg    Examination:  General: Remains on room air.  No acute distress.  ENT/neck: No JVD elevation or neck masses noted.  But sometimes mononucleosis respiratory: Decreased breath sounds at bases with scattered crackles  CVS: Rate currently controlled; S1 and S2 are heard abdominal: Soft, obese, still distended slightly; no organomegaly, bowel sounds normally heard  extremities: No cyanosis; lower extremity edema present bilaterally.  Left lower extremity dressing present CNS: Wakes up slightly, confused.  Still slow to respond and a poor historian.  No focal deficits noted  lymph: No palpable lymphadenopathy noted  skin: No obvious petechiae/rashes psych: Affect is mostly flat; not agitated currently musculoskeletal: No obvious joint tenderness/erythema    Data Reviewed: I have personally reviewed following labs and imaging studies  CBC: Recent Labs  Lab 06/18/24 1619 06/19/24 0215  06/20/24 1324 06/22/24 0548 06/24/24 0532  WBC 13.0* 8.8 8.1 7.6 7.3  NEUTROABS 11.2*  --  6.0 5.1 4.7  HGB 13.1 13.0 12.0* 11.2* 11.7*  HCT 41.7 40.4 36.5* 35.1* 37.2*  MCV 89.9 88.6 87.3 88.2 90.7  PLT 151 154 145* 166 197   Basic Metabolic Panel: Recent Labs  Lab 06/20/24 0638 06/22/24 0548 06/23/24 0600 06/24/24 0532 06/25/24 0640  NA 140 139 142 140 139  K 3.6 3.4* 3.8 3.5 3.8  CL 103 104 108 106 105  CO2 23 24 20* 22 24  GLUCOSE 138* 133* 140* 122* 127*  BUN 14 14 18 14 13   CREATININE 1.28* 1.28* 1.26* 1.11 1.08  CALCIUM  9.6 8.7* 8.7* 9.1 9.1  MG  --  2.1 2.1 2.2 2.1   GFR: Estimated Creatinine Clearance: 40.2 mL/min (by C-G formula based on SCr of 1.08 mg/dL). Liver Function Tests: Recent Labs  Lab 06/18/24 1619  06/19/24 0215  AST 51* 60*  ALT 38 38  ALKPHOS 74 76  BILITOT 1.2 1.0  PROT 7.4 7.0  ALBUMIN 4.0 3.9   Recent Labs  Lab 06/18/24 1619  LIPASE 40   No results for input(s): AMMONIA in the last 168 hours. Coagulation Profile: No results for input(s): INR, PROTIME in the last 168 hours. Cardiac Enzymes: No results for input(s): CKTOTAL, CKMB, CKMBINDEX, TROPONINI in the last 168 hours. BNP (last 3 results) No results for input(s): PROBNP in the last 8760 hours. HbA1C: No results for input(s): HGBA1C in the last 72 hours.  CBG: Recent Labs  Lab 06/24/24 0809 06/24/24 1245 06/24/24 1711 06/24/24 2202 06/25/24 0804  GLUCAP 106* 117* 110* 122* 122*   Lipid Profile: No results for input(s): CHOL, HDL, LDLCALC, TRIG, CHOLHDL, LDLDIRECT in the last 72 hours. Thyroid  Function Tests: No results for input(s): TSH, T4TOTAL, FREET4, T3FREE, THYROIDAB in the last 72 hours. Anemia Panel: No results for input(s): VITAMINB12, FOLATE, FERRITIN, TIBC, IRON, RETICCTPCT in the last 72 hours. Sepsis Labs: No results for input(s): PROCALCITON, LATICACIDVEN in the last 168 hours.  No results  found for this or any previous visit (from the past 240 hours).       Radiology Studies: No results found.       Scheduled Meds:  acetaminophen   1,000 mg Oral Q8H   apixaban   5 mg Oral BID   dutasteride   0.5 mg Oral Daily   escitalopram   20 mg Oral QHS   hydrALAZINE   50 mg Oral Q8H   insulin  aspart  0-5 Units Subcutaneous QHS   insulin  aspart  0-9 Units Subcutaneous TID WC   pantoprazole   40 mg Oral Daily   psyllium  1 packet Oral Daily   QUEtiapine   50 mg Oral Q24H   senna-docusate  1 tablet Oral BID   Continuous Infusions:          Sophie Mao, MD [facility] 06/25/2024, 8:36 AM   "

## 2024-06-25 NOTE — Evaluation (Addendum)
 Clinical/Bedside Swallow Evaluation Patient Details  Name: [name] MRN: [id number] Date of Birth: Mar 03, 1928  Today's Date: 06/25/2024 Time: SLP Start Time (ACUTE ONLY): 1112 SLP Stop Time (ACUTE ONLY): 1122 SLP Time Calculation (min) (ACUTE ONLY): 10 min  Past Medical History:  Past Medical History:  Diagnosis Date   BPH (benign prostatic hypertrophy)    Depression    Diverticulosis of colon    GERD (gastroesophageal reflux disease)    Gilbert's syndrome    History of kidney stones    HLD (hyperlipidemia)    HOH (hard of hearing)    refuses to wear his Hearing Aid   Hypertension    Inguinal hernia    right   Internal hemorrhoid    Normal coronary arteries    a. Cath 07/2010: normal coronaries. Felt to have noncardiac CP/SOB at that time possibly r/t anxiety.   RA (rheumatoid arthritis) (HCC)    bilateral hands/ wrist--  seronegative   S/P TAVR (transcatheter aortic valve replacement) 03/27/2018   23 mm Edwards Sapien 3 transcatheter heart valve placed via percutaneous right transfemoral approach    Severe aortic stenosis    Thyroid  nodule    noted 11-2009   Wears dentures    Past Surgical History:  Past Surgical History:  Procedure Laterality Date   CARDIAC CATHETERIZATION  07-19-2010  dr wilbert turner   normal coronary arteries,  ef 60%,  moderate aortic stenosis- gradient , normal right heart pressure   CARDIOVASCULAR STRESS TEST  05/ 2011   dr jeffrie   normal low risk perfusion study   CATARACT EXTRACTION W/ INTRAOCULAR LENS  IMPLANT, BILATERAL  2013   CATARACT EXTRACTION, BILATERAL     CHOLECYSTECTOMY N/A 10/11/2018   Procedure: LAPAROSCOPIC CHOLECYSTECTOMY WITH INTRAOPERATIVE CHOLANGIOGRAM;  Surgeon: Curvin Deward MOULD, MD;  Location: [facility] OR;  Service: General;  Laterality: N/A;   COLONOSCOPY  2010  approx   ERCP N/A 09/25/2018   Procedure: ENDOSCOPIC RETROGRADE CHOLANGIOPANCREATOGRAPHY (ERCP);  Surgeon: Rosalie Kitchens, MD;  Location: [facility] ENDOSCOPY;  Service:  Endoscopy;  Laterality: N/A;   INGUINAL HERNIA REPAIR Right 08/23/2018   Procedure: OPEN RIGHT INGUINAL HERNIA REPAIR WITH MESH;  Surgeon: Eletha Boas, MD;  Location: [facility] ORS;  Service: General;  Laterality: Right;   INTRAOPERATIVE TRANSTHORACIC ECHOCARDIOGRAM N/A 03/27/2018   Procedure: INTRAOPERATIVE TRANSTHORACIC ECHOCARDIOGRAM;  Surgeon: Verlin Lonni BIRCH, MD;  Location: [facility] OR;  Service: Open Heart Surgery;  Laterality: N/A;   REMOVAL OF STONES  09/25/2018   Procedure: REMOVAL OF STONES;  Surgeon: Rosalie Kitchens, MD;  Location: [facility] ENDOSCOPY;  Service: Endoscopy;;   RIGHT/LEFT HEART CATH AND CORONARY ANGIOGRAPHY N/A 02/06/2018   Procedure: RIGHT/LEFT HEART CATH AND CORONARY ANGIOGRAPHY;  Surgeon: Verlin Lonni BIRCH, MD;  Location: [facility] INVASIVE CV LAB;  Service: Cardiovascular;  Laterality: N/A;   SPHINCTEROTOMY  09/25/2018   Procedure: SPHINCTEROTOMY;  Surgeon: Rosalie Kitchens, MD;  Location: [facility] ENDOSCOPY;  Service: Endoscopy;;   TEE WITHOUT CARDIOVERSION N/A 12/24/2021   Procedure: TRANSESOPHAGEAL ECHOCARDIOGRAM (TEE);  Surgeon: Alvan Ronal BRAVO, MD;  Location: [facility] ENDOSCOPY;  Service: Cardiovascular;  Laterality: N/A;   THYROID  LOBECTOMY Right 05/05/2015   Procedure: RIGHT THYROID  LOBECTOMY;  Surgeon: Boas Eletha, MD;  Location: [facility] OR;  Service: General;  Laterality: Right;   TRANSANAL HEMORRHOIDAL DEARTERIALIZATION N/A 04/09/2014   Procedure: TRANSANAL HEMORRHOIDAL DEARTERIALIZATION OF INTERNAL HEMORROIDS;  Surgeon: Bernarda Ned, MD;  Location: [facility];  Service: General;  Laterality: N/A;   TRANSCATHETER AORTIC VALVE REPLACEMENT, TRANSFEMORAL  03/27/2018   TRANSCATHETER AORTIC VALVE  REPLACEMENT, TRANSFEMORAL N/A 03/27/2018   Procedure: TRANSCATHETER AORTIC VALVE REPLACEMENT, TRANSFEMORAL. Edwards SAPIEN 3 Transcatheter Heart Valve 23mm.;  Surgeon: Verlin Lonni BIRCH, MD;  Location: [facility] OR;  Service: Open Heart Surgery;  Laterality: N/A;   TRANSTHORACIC ECHOCARDIOGRAM   11-26-2013   moderate focal basal and mild LVH/  ef 65-70%/  grade I diastolic dysfunction/ mild LAE/  moderate calcification with stenosis AV with mild regurg,  gradients 35 abd 58 mmHg /  mild TR   HPI:  The patient is a 89 year old gentleman who presented to the emergency room 06/18/24 with  diarrhea and weakness, and difficulty walking after an apparant mechanical fall. Found to have L ankle fx and admitted due to acute renal failure. Swallow evaluated 1/8; seen again on the 9th and SLP signed off given stable swallowing. New orders 1/12 due to nursing concerns for worsening swallow. RN on 1/13 reported no difficulty noted with breakfast or meds.  Clinical swallow eval in 2023 appeared to be functional with regular solids and thin liquids recommended. 08/29/16 MBS: primary esophageal dysphagia; functional oropharyngeal swallow (sudden coughing after swallowing not correlated with aspiration). PMH includes: GERD, thyroid  nodule, wears dentures, hard of hearing    Assessment / Plan / Recommendation  Clinical Impression  PLAN: continue regular solids; thin liquids. Swallow function appears to be stable.    Repeat consult ordered.  Russian interpreter present via video.  Pt alert, cheerful and participatory.  Oral mechanism exam was normal. He self fed regular solids and drank thin liquids with no further concerns for dysphagia nor aspiration.  No intervention is needed. D/W RN. SLP will sign off.   SLP Visit Diagnosis: Dysphagia, unspecified (R13.10)    Aspiration Risk  No limitations    Diet Recommendation   Thin;Age appropriate regular  Medication Administration: Other (Comment) (as tolerated)    Other Recommendations Oral Care Recommendations: Oral care BID           Swallow Study   General HPI: The patient is a 89 year old gentleman who presented to the emergency room 06/18/24 with  diarrhea and weakness, and difficulty walking after an apparant mechanical fall. Found to have L ankle fx  and admitted due to acute renal failure. Swallow evaluated 1/8; seen again on the 9th and SLP signed off given stable swallowing. New orders 1/12 due to nursing concerns for worsening swallow. RN on 1/13 reported no difficulty noted with breakfast or meds.  Clinical swallow eval in 2023 appeared to be functional with regular solids and thin liquids recommended. 08/29/16 MBS: primary esophageal dysphagia; functional oropharyngeal swallow (sudden coughing after swallowing not correlated with aspiration). PMH includes: GERD, thyroid  nodule, wears dentures, hard of hearing Type of Study: Bedside Swallow Evaluation Previous Swallow Assessment: see HPI Diet Prior to this Study: Regular;Thin liquids (Level 0) Temperature Spikes Noted: No Respiratory Status: Room air History of Recent Intubation: No Behavior/Cognition: Alert;Cooperative;Pleasant mood Oral Cavity Assessment: Within Functional Limits Oral Care Completed by SLP: No Oral Cavity - Dentition: Dentures, not available Vision: Functional for self-feeding Self-Feeding Abilities: Able to feed self Patient Positioning: Upright in bed Baseline Vocal Quality: Normal Volitional Cough: Strong Volitional Swallow: Able to elicit    Oral/Motor/Sensory Function Overall Oral Motor/Sensory Function: Within functional limits   Ice Chips Ice chips: Not tested   Thin Liquid Thin Liquid: Within functional limits    Nectar Thick Nectar Thick Liquid: Not tested   Honey Thick Honey Thick Liquid: Not tested   Puree Puree: Within functional limits   Solid  Solid: Within functional limits      Vona Palma Laurice 06/25/2024,11:24 AM  Palma L. Vona, MA CCC/SLP Clinical Specialist - Acute Care SLP Acute Rehabilitation Services Office number [phone]

## 2024-06-25 NOTE — Plan of Care (Signed)

## 2024-06-26 DIAGNOSIS — N179 Acute kidney failure, unspecified: Secondary | ICD-10-CM | POA: Diagnosis not present

## 2024-06-26 LAB — CBC WITH DIFFERENTIAL/PLATELET
Abs Immature Granulocytes: 0.02 K/uL (ref 0.00–0.07)
Basophils Absolute: 0 K/uL (ref 0.0–0.1)
Basophils Relative: 1 %
Eosinophils Absolute: 0.5 K/uL (ref 0.0–0.5)
Eosinophils Relative: 6 %
HCT: 33.9 % — ABNORMAL LOW (ref 39.0–52.0)
Hemoglobin: 11 g/dL — ABNORMAL LOW (ref 13.0–17.0)
Immature Granulocytes: 0 %
Lymphocytes Relative: 20 %
Lymphs Abs: 1.6 K/uL (ref 0.7–4.0)
MCH: 29 pg (ref 26.0–34.0)
MCHC: 32.4 g/dL (ref 30.0–36.0)
MCV: 89.4 fL (ref 80.0–100.0)
Monocytes Absolute: 0.7 K/uL (ref 0.1–1.0)
Monocytes Relative: 9 %
Neutro Abs: 5.3 K/uL (ref 1.7–7.7)
Neutrophils Relative %: 64 %
Platelets: 221 K/uL (ref 150–400)
RBC: 3.79 MIL/uL — ABNORMAL LOW (ref 4.22–5.81)
RDW: 12.5 % (ref 11.5–15.5)
WBC: 8.2 K/uL (ref 4.0–10.5)
nRBC: 0 % (ref 0.0–0.2)

## 2024-06-26 LAB — GLUCOSE, CAPILLARY
Glucose-Capillary: 111 mg/dL — ABNORMAL HIGH (ref 70–99)
Glucose-Capillary: 155 mg/dL — ABNORMAL HIGH (ref 70–99)
Glucose-Capillary: 103 mg/dL — ABNORMAL HIGH (ref 70–99)
Glucose-Capillary: 121 mg/dL — ABNORMAL HIGH (ref 70–99)

## 2024-06-26 LAB — BASIC METABOLIC PANEL WITH GFR
Anion gap: 12 (ref 5–15)
BUN: 13 mg/dL (ref 8–23)
CO2: 23 mmol/L (ref 22–32)
Calcium: 8.8 mg/dL — ABNORMAL LOW (ref 8.9–10.3)
Chloride: 105 mmol/L (ref 98–111)
Creatinine, Ser: 1.09 mg/dL (ref 0.61–1.24)
GFR, Estimated: >60 mL/min
Glucose, Bld: 112 mg/dL — ABNORMAL HIGH (ref 70–99)
Potassium: 3.6 mmol/L (ref 3.5–5.1)
Sodium: 140 mmol/L (ref 135–145)

## 2024-06-26 LAB — MAGNESIUM: Magnesium: 2.1 mg/dL (ref 1.7–2.4)

## 2024-06-26 NOTE — Plan of Care (Signed)
" °  Problem: Fluid Volume: Goal: Ability to maintain a balanced intake and output will improve Outcome: Progressing   Problem: Metabolic: Goal: Ability to maintain appropriate glucose levels will improve Outcome: Progressing   Problem: Nutritional: Goal: Maintenance of adequate nutrition will improve Outcome: Progressing Goal: Progress toward achieving an optimal weight will improve Outcome: Progressing   Problem: Skin Integrity: Goal: Risk for impaired skin integrity will decrease Outcome: Progressing   Problem: Tissue Perfusion: Goal: Adequacy of tissue perfusion will improve Outcome: Progressing   Problem: Nutrition: Goal: Adequate nutrition will be maintained Outcome: Progressing   Problem: Coping: Goal: Level of anxiety will decrease Outcome: Progressing   Problem: Elimination: Goal: Will not experience complications related to bowel motility Outcome: Progressing Goal: Will not experience complications related to urinary retention Outcome: Progressing   Problem: Pain Managment: Goal: General experience of comfort will improve and/or be controlled Outcome: Progressing   Problem: Safety: Goal: Ability to remain free from injury will improve Outcome: Progressing   Problem: Skin Integrity: Goal: Risk for impaired skin integrity will decrease Outcome: Progressing   "

## 2024-06-26 NOTE — NC FL2 (Addendum)
 " [place]  MEDICAID FL2 LEVEL OF CARE FORM     IDENTIFICATION  Patient Name: [name] Birthdate: 06-17-1927 Sex: male Admission Date (Current Location): 06/18/2024  [facility] and [place] Number:  Producer, Television/film/video and Address:  [facility],  [address] [place], [place] [address]      Provider Number: [id number]  Attending Physician Name and Address:  Trixie Nilda HERO, MD  Relative Name and Phone Number:  Eleftherios, Dudenhoeffer  Daughter, Emergency Contact  [phone]    Current Level of Care: Hospital Recommended Level of Care: Skilled Nursing Facility Prior Approval Number:    Date Approved/Denied:   PASRR Number: [id number]  Discharge Plan: SNF    Current Diagnoses: Patient Active Problem List   Diagnosis Date Noted   Medication management 06/25/2024   Counseling and coordination of care 06/25/2024   General weakness 06/22/2024   Goals of care, counseling/discussion 06/22/2024   ARF (acute renal failure) 06/19/2024   History of stroke 06/19/2024   Closed left ankle fracture 06/19/2024   Closed nondisplaced trimalleolar fracture of left lower leg 06/19/2024   Diverticulitis 06/18/2024   Pain due to onychomycosis of toenails of both feet 07/21/2022   Dermatitis 07/21/2022   Psoriatic arthritis of multiple joints (HCC) 04/03/2022   Palliative care by specialist 02/20/2022   Difficulty swallowing pills 02/20/2022   CVA (cerebral vascular accident) (HCC) 12/25/2021   Symptomatic severe aortic stenosis with normal ejection fraction 12/21/2021   Near syncope 12/20/2021   Dementia without behavioral disturbance, psychotic disturbance, mood disturbance, or anxiety (HCC) 12/20/2021   Unspecified cirrhosis of liver (HCC) 12/20/2021   Hyperbilirubinemia 12/20/2021   Syncope 10/04/2019   Abdominal pain 10/09/2018   CKD (chronic kidney disease), stage II 10/09/2018   RUQ pain    Elevated LFTs 09/22/2018   Inguinal hernia of right side without obstruction or  gangrene 08/22/2018   Chronic diastolic CHF (congestive heart failure) (HCC) 07/24/2018   Left-sided carotid artery disease 07/24/2018   Chest pain 06/05/2018   S/P TAVR (transcatheter aortic valve replacement) 03/27/2018   RA (rheumatoid arthritis) (HCC)    HOH (hard of hearing)    GERD (gastroesophageal reflux disease)    Benign prostatic hyperplasia    HLD (hyperlipidemia)    Depression    Diverticulosis 02/07/2018   Severe aortic stenosis    Benign essential HTN    Musculoskeletal chest pain 08/20/2016   Neoplasm of uncertain behavior of thyroid  gland, 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   Sinus bradycardia 06/20/2014    Orientation RESPIRATION BLADDER Height & Weight     Self  Normal Incontinent Weight: 82 kg Height:  5' 3 (160 cm)  BEHAVIORAL SYMPTOMS/MOOD NEUROLOGICAL BOWEL NUTRITION STATUS      Continent Diet (Heart Healthy/ Carb Modified, Thin Liquids)  AMBULATORY STATUS COMMUNICATION OF NEEDS Skin   Extensive Assist Verbally Normal                       Personal Care Assistance Level of Assistance  Bathing, Feeding, Dressing Bathing Assistance: Maximum assistance Feeding assistance: Limited assistance Dressing Assistance: Maximum assistance     Functional Limitations Info  Sight, Hearing, Speech Sight Info: Impaired Hearing Info: Impaired Speech Info: Adequate    SPECIAL CARE FACTORS FREQUENCY  PT (By licensed PT), OT (By licensed OT)     PT Frequency: 5x/wk OT Frequency: 5x/wk            Contractures Contractures Info: Not present    Additional Factors Info  Code Status, Allergies  Code Status Info: DNR Allergies Info: Ativan  (Lorazepam ), Phenergan  (Promethazine  Hcl), Haldol  (Haloperidol ), Methocarbamol , Solifenacin           Current Medications (06/26/2024):  This is the current hospital active medication list Current Facility-Administered Medications  Medication Dose Route Frequency Provider Last Rate Last Admin   acetaminophen  (TYLENOL ) tablet  1,000 mg  1,000 mg Oral Q8H Anwar, Zeba, MD   1,000 mg at 06/26/24 9386   apixaban  (ELIQUIS ) tablet 5 mg  5 mg Oral BID Kakrakandy, Arshad N, MD   5 mg at 06/26/24 9141   dutasteride  (AVODART ) capsule 0.5 mg  0.5 mg Oral Daily Franky Redia SAILOR, MD   0.5 mg at 06/26/24 0858   escitalopram  (LEXAPRO ) tablet 20 mg  20 mg Oral QHS Anwar, Zeba, MD   20 mg at 06/25/24 2115   hydrALAZINE  (APRESOLINE ) tablet 50 mg  50 mg Oral Q8H Pahwani, Ravi, MD   50 mg at 06/26/24 9386   insulin  aspart (novoLOG ) injection 0-5 Units  0-5 Units Subcutaneous QHS Vernon Ranks, MD       insulin  aspart (novoLOG ) injection 0-9 Units  0-9 Units Subcutaneous TID WC Pahwani, Ravi, MD   2 Units at 06/26/24 1250   ipratropium-albuterol  (DUONEB) 0.5-2.5 (3) MG/3ML nebulizer solution 3 mL  3 mL Nebulization Q4H PRN Alekh, Kshitiz, MD       melatonin tablet 10-15 mg  10-15 mg Oral PRN Pahwani, Ravi, MD   15 mg at 06/22/24 2038   oxyCODONE  (Oxy IR/ROXICODONE ) immediate release tablet 2.5 mg  2.5 mg Oral Q4H PRN Anwar, Zeba, MD       pantoprazole  (PROTONIX ) EC tablet 40 mg  40 mg Oral Daily Kakrakandy, Arshad N, MD   40 mg at 06/26/24 0858   psyllium (HYDROCIL/METAMUCIL) 1 packet  1 packet Oral Daily Cheryle Page, MD   1 packet at 06/26/24 0901   QUEtiapine  (SEROQUEL ) tablet 50 mg  50 mg Oral Q24H Pahwani, Ravi, MD   50 mg at 06/25/24 2115   senna-docusate (Senokot-S) tablet 1 tablet  1 tablet Oral BID Cheryle Page, MD   1 tablet at 06/26/24 9141     Discharge Medications: Please see discharge summary for a list of discharge medications.  Relevant Imaging Results:  Relevant Lab Results:   Additional Information SSN: [id number]  Jon ONEIDA Anon, RN     "

## 2024-06-26 NOTE — TOC Progression Note (Signed)
 Transition of Care [facility]) - Progression Note    Patient Details  Name: [name] MRN: [id number] Date of Birth: Jul 05, 1927  [facility] of [facility]) CM/SW Contact  Jon ONEIDA Anon, RN Phone Number: 06/26/2024, 2:41 PM  Clinical Narrative:    Pt has Level I PASRR, PI:[id number]. RNCM spoke with pt daughter [name] and she states she would have preferred pt go to [facility]. Updated pt daughter and informed her pt is medically stable for discharge. Discussed sending referrals out for SNF placement. Pt daughter states she would prefer pt go to [facility] and Rehab for STR. Referrals sent out for SNF placement. Awaiting any bed offers. Informed pt daughter would present bed offers once they become available. ICM continuing to follow for DC planning needs.   Expected Discharge Plan: Skilled Nursing Facility Barriers to Discharge: Continued Medical Work up               Expected Discharge Plan and Services In-house Referral: Clinical Social Work Discharge Planning Services: NA Post Acute Care Choice: Resumption of Svcs/PTA Provider, IP Rehab Living arrangements for the past 2 months: Single Family Home                 DME Arranged: N/A DME Agency: NA       HH Arranged: NA HH Agency: NA         Social Drivers of Health (SDOH) Interventions SDOH Screenings   Food Insecurity: No Food Insecurity (06/19/2024)  Housing: Low Risk (06/19/2024)  Transportation Needs: No Transportation Needs (06/19/2024)  Utilities: Not At Risk (06/19/2024)  Social Connections: Patient Unable To Answer (06/19/2024)  Tobacco Use: Medium Risk (06/19/2024)    Readmission Risk Interventions    06/20/2024    2:21 PM  Readmission Risk Prevention Plan  Transportation Screening Complete  PCP or Specialist Appt within 5-7 Days Complete  Home Care Screening Complete  Medication Review (RN CM) Complete

## 2024-06-26 NOTE — Progress Notes (Signed)
 " PROGRESS NOTE  [name] FMW:[id number] DOB: May 06, 1928 DOA: 06/18/2024 PCP: Rexanne Ingle, MD   LOS: 7 days   Brief Narrative / Interim history: 89 year old male with history of bioprosthetic aortic valve replacement status post TAVR, prior CVA on Eliquis , HTN, BPH, RA, psoriasis, liver cirrhosis comes into the hospital with weakness and difficulties walking.  Imaging on admission was concerning for sigmoid diverticulitis, and also CT scan showed ankle fracture.  He was placed on antibiotics and admitted to the hospital.  Subjective / 24h Interval events: Russian iPad interpreter used in the room.  He has underlying dementia and cannot tell me really why he is in the hospital or what happened prior to coming in.  This morning he states that he is feeling well, denies any abdominal pain, no nausea or vomiting.  Assesement and Plan: Principal problem Acute sigmoid diverticulitis -he has completed a week of IV antibiotics, now discontinued.  He has no abdominal pain, no leukocytosis, he is afebrile.  He is medically stable to discharge to SNF/CIR per family preference  Active problems Acute kidney injury-came in with a creatinine of 1.5.  Now back to baseline  Possible aspiration pneumonia-completed antibiotics as per #1.  He is afebrile, on room air  Left ankle fracture-orthopedic surgery consulted, conservative management was recommended.  He has a splint as per orthopedic recommendations.  Needs rehabilitation  Dementia, delirium-continue Seroquel .  He is awake, alert, confused but appropriate  Essential hypertension-continue hydralazine , ARB on hold.  Blood pressure acceptable  Hypokalemia-continue to monitor and replenish as indicated  History of TAVR-no active issues  Prior CVA-continue Eliquis   DM2, with hyperglycemia-continue sliding scale  History of psoriasis, RA-outpatient management  History of liver cirrhosis-appears compensated  Disposition-PT recommending SNF,  family very interested in CIR.  TOC consulted, he is medically stable to go either to SNF versus CIR  Scheduled Meds:  acetaminophen   1,000 mg Oral Q8H   apixaban   5 mg Oral BID   dutasteride   0.5 mg Oral Daily   escitalopram   20 mg Oral QHS   hydrALAZINE   50 mg Oral Q8H   insulin  aspart  0-5 Units Subcutaneous QHS   insulin  aspart  0-9 Units Subcutaneous TID WC   pantoprazole   40 mg Oral Daily   psyllium  1 packet Oral Daily   QUEtiapine   50 mg Oral Q24H   senna-docusate  1 tablet Oral BID   Continuous Infusions: PRN Meds:.ipratropium-albuterol , melatonin, oxyCODONE   Current Outpatient Medications  Medication Instructions   apixaban  (ELIQUIS ) 5 mg, Oral, 2 times daily   clobetasol  cream (TEMOVATE ) 0.05 % 1 application , Topical, 2 times daily   dutasteride  (AVODART ) 0.5 mg, Daily   escitalopram  (LEXAPRO ) 10 mg, Daily   furosemide  (LASIX ) 20 MG tablet TAKE 1 TABLET(20 MG) BY MOUTH DAILY AS NEEDED   losartan  (COZAAR ) 50 mg, Oral, Daily   losartan  (COZAAR ) 25 mg, Oral, Daily   melatonin 10-15 mg, As needed   Multiple Vitamin (MULTIVITAMIN WITH MINERALS) TABS tablet 1 tablet, Oral, Daily   pantoprazole  (PROTONIX ) 40 mg, Oral, Daily   polyethylene glycol (MIRALAX ) 17 g, Oral, Daily PRN   potassium chloride  (KLOR-CON ) 10 MEQ tablet 10 mEq, Oral, Daily   senna-docusate (SENOKOT-S) 8.6-50 MG tablet 1 tablet, Oral, 2 times daily   SKYRIZI  PEN 150 MG/ML SOAJ INJECT 150MG  SUBCUTANEOUSLY EVERY 12 WEEKS AS DIRECTED.    Diet Orders (From admission, onward)     Start     Ordered   06/21/24 1112  Diet heart healthy/carb  modified Room service appropriate? Yes; Fluid consistency: Thin  Diet effective now       Question Answer Comment  Diet-HS Snack? Nothing   Room service appropriate? Yes   Fluid consistency: Thin      06/21/24 1111            DVT prophylaxis:  apixaban  (ELIQUIS ) tablet 5 mg   Lab Results  Component Value Date   PLT 221 06/26/2024      Code Status:  Limited: Do not attempt resuscitation (DNR) -DNR-LIMITED -Do Not Intubate/DNI   Family Communication: No family at bedside  Status is: Inpatient Remains inpatient appropriate because: Awaiting placement   Level of care: Med-Surg  Consultants:  Palliative care  Objective: Vitals:   06/25/24 2036 06/26/24 0540 06/26/24 0740 06/26/24 1150  BP: (!) 177/65 (!) 161/62  (!) 149/51  Pulse: 76 70  70  Resp: 19 19  20   Temp: 99.7 F (37.6 C) 98.2 F (36.8 C)  98 F (36.7 C)  TempSrc: Oral Oral  Oral  SpO2: 95% 95%  97%  Weight:   82 kg   Height:        Intake/Output Summary (Last 24 hours) at 06/26/2024 1320 Last data filed at 06/26/2024 1150 Gross per 24 hour  Intake 240 ml  Output 750 ml  Net -510 ml   Wt Readings from Last 3 Encounters:  06/26/24 82 kg  02/07/24 79.9 kg  09/11/23 79.5 kg    Examination:  Constitutional: NAD Eyes: no scleral icterus ENMT: Mucous membranes are moist.  Neck: normal, supple Respiratory: clear to auscultation bilaterally, no wheezing, no crackles.  Cardiovascular: Regular rate and rhythm, no murmurs / rubs / gallops. No LE edema.  Abdomen: non distended, no tenderness. Bowel sounds positive.  Musculoskeletal: no clubbing / cyanosis.   Data Reviewed: I have independently reviewed following labs and imaging studies   CBC Recent Labs  Lab 06/20/24 1324 06/22/24 0548 06/24/24 0532 06/26/24 0658  WBC 8.1 7.6 7.3 8.2  HGB 12.0* 11.2* 11.7* 11.0*  HCT 36.5* 35.1* 37.2* 33.9*  PLT 145* 166 197 221  MCV 87.3 88.2 90.7 89.4  MCH 28.7 28.1 28.5 29.0  MCHC 32.9 31.9 31.5 32.4  RDW 12.1 12.1 12.2 12.5  LYMPHSABS 1.2 1.3 1.2 1.6  MONOABS 0.6 0.7 0.7 0.7  EOSABS 0.2 0.5 0.6* 0.5  BASOSABS 0.0 0.0 0.0 0.0    Recent Labs  Lab 06/22/24 0548 06/23/24 0600 06/24/24 0532 06/25/24 0640 06/26/24 0658  NA 139 142 140 139 140  K 3.4* 3.8 3.5 3.8 3.6  CL 104 108 106 105 105  CO2 24 20* 22 24 23   GLUCOSE 133* 140* 122* 127* 112*  BUN 14  18 14 13 13   CREATININE 1.28* 1.26* 1.11 1.08 1.09  CALCIUM  8.7* 8.7* 9.1 9.1 8.8*  MG 2.1 2.1 2.2 2.1 2.1    ------------------------------------------------------------------------------------------------------------------ No results for input(s): CHOL, HDL, LDLCALC, TRIG, CHOLHDL, LDLDIRECT in the last 72 hours.  Lab Results  Component Value Date   HGBA1C 7.0 (H) 06/19/2024   ------------------------------------------------------------------------------------------------------------------ No results for input(s): TSH, T4TOTAL, T3FREE, THYROIDAB in the last 72 hours.  Invalid input(s): FREET3  Cardiac Enzymes No results for input(s): CKMB, TROPONINI, MYOGLOBIN in the last 168 hours.  Invalid input(s): CK ------------------------------------------------------------------------------------------------------------------    Component Value Date/Time   BNP 48.9 10/04/2019 2133    CBG: Recent Labs  Lab 06/25/24 1204 06/25/24 1727 06/25/24 2121 06/26/24 0738 06/26/24 1147  GLUCAP 192* 134* 87 111* 155*  No results found for this or any previous visit (from the past 240 hours).   Radiology Studies: No results found.   Nilda Fendt, MD, PhD [facility]  Between 7 am - 7 pm I am available, please contact me via Amion (for emergencies) or Securechat (non urgent messages)  Between 7 pm - 7 am I am not available, please contact night coverage MD/APP via Amion  "

## 2024-06-26 NOTE — TOC CM/SW Note (Signed)
 30 Day PASRR Note   Patient Details  Name: [name] Date of Birth: 1928-06-10   Transition of Care [facility]) CM/SW Contact:    Jon ONEIDA Anon, RN Phone Number: 06/26/2024, 2:15 PM  To Whom It May Concern:  Please be advised that this patient will require a short-term nursing home stay - anticipated 30 days or less for rehabilitation and strengthening.   The plan is for return home.

## 2024-06-27 DIAGNOSIS — N179 Acute kidney failure, unspecified: Secondary | ICD-10-CM | POA: Diagnosis not present

## 2024-06-27 LAB — COMPREHENSIVE METABOLIC PANEL WITH GFR
ALT: 35 U/L (ref 0–44)
AST: 53 U/L — ABNORMAL HIGH (ref 15–41)
Albumin: 3.5 g/dL (ref 3.5–5.0)
Alkaline Phosphatase: 70 U/L (ref 38–126)
Anion gap: 10 (ref 5–15)
BUN: 13 mg/dL (ref 8–23)
CO2: 21 mmol/L — ABNORMAL LOW (ref 22–32)
Calcium: 8.9 mg/dL (ref 8.9–10.3)
Chloride: 109 mmol/L (ref 98–111)
Creatinine, Ser: 1.04 mg/dL (ref 0.61–1.24)
GFR, Estimated: >60 mL/min
Glucose, Bld: 105 mg/dL — ABNORMAL HIGH (ref 70–99)
Potassium: 4 mmol/L (ref 3.5–5.1)
Sodium: 140 mmol/L (ref 135–145)
Total Bilirubin: 1.3 mg/dL — ABNORMAL HIGH (ref 0.0–1.2)
Total Protein: 6.8 g/dL (ref 6.5–8.1)

## 2024-06-27 LAB — GLUCOSE, CAPILLARY
Glucose-Capillary: 138 mg/dL — ABNORMAL HIGH (ref 70–99)
Glucose-Capillary: 150 mg/dL — ABNORMAL HIGH (ref 70–99)
Glucose-Capillary: 106 mg/dL — ABNORMAL HIGH (ref 70–99)
Glucose-Capillary: 118 mg/dL — ABNORMAL HIGH (ref 70–99)

## 2024-06-27 LAB — CBC
HCT: 34.8 % — ABNORMAL LOW (ref 39.0–52.0)
Hemoglobin: 11.1 g/dL — ABNORMAL LOW (ref 13.0–17.0)
MCH: 28.8 pg (ref 26.0–34.0)
MCHC: 31.9 g/dL (ref 30.0–36.0)
MCV: 90.4 fL (ref 80.0–100.0)
Platelets: 255 K/uL (ref 150–400)
RBC: 3.85 MIL/uL — ABNORMAL LOW (ref 4.22–5.81)
RDW: 12.7 % (ref 11.5–15.5)
WBC: 7.4 K/uL (ref 4.0–10.5)
nRBC: 0 % (ref 0.0–0.2)

## 2024-06-27 LAB — MAGNESIUM: Magnesium: 2.3 mg/dL (ref 1.7–2.4)

## 2024-06-27 NOTE — TOC Progression Note (Addendum)
 Transition of Care [facility]) - Progression Note    Patient Details  Name: [name] MRN: [id number] Date of Birth: 1928-01-12  Transition of Care [facility]) CM/SW Contact  Sonda Manuella Quill, RN Phone Number: 06/27/2024, 12:05 PM  Clinical Narrative:    [place] w pt's dtr [name] Stall [phone]) on speaker phone and pt in room w/ assistance of Aram, interpreter # [phone]; pt's dtr requested [facility]; explained facility did not offer bed; they were given names of facilities that offered short-term rehab bed; list of facilities left on window seat of patient's room; they verbalized understanding and will make choice; ins auth needed; will follow up for choice; also pt's dtr said she will contact [facility] to see if bed can be offered.  Service Provider  [facility]   [facility]   [facility] Preferred SNF   [facility] Preferred SNF    -1240- spoke w/ Erie at [facility]; she said she has spoken w/ pt's dtr, and explained facility is full; she also said pt's dtr is willing to take a semi-private room; she will review his chart and send notification via hub if able to offer bed; pt's dtr Dora notified and verbalized understanding; [name] also said 2nd choice for bed is [facility]; explained [facility] did not offer bed; IP CM following.  -1355- notified by Erie at [facility] facility unable to offer bed; she said she has notified pt's dtr.  -1659- pt offered bed at [facility] in hub; spoke w/ his dtr [name] and she accepted offer; will initiate ins auth.  -1730- spoke w/ Artina at [facility]; she said pt is managed by agency; ins auth initiated ref # [id number]; she requested clinicals be faxed to [phone]; documents faxed per request (face sheet, H&P, MD progress notes, PT/OT/ST evals, FL2; awaiting electronic confirmation.  -1815- electronic confirmation received Expected Discharge Plan: Skilled Nursing Facility Barriers to Discharge: Continued  Medical Work up               Expected Discharge Plan and Services In-house Referral: Clinical Social Work Discharge Planning Services: NA Post Acute Care Choice: Resumption of Paediatric Nurse, IP Rehab Living arrangements for the past 2 months: Single Family Home                 DME Arranged: N/A DME Agency: NA       HH Arranged: NA HH Agency: NA         Social Drivers of Health (SDOH) Interventions SDOH Screenings   Food Insecurity: No Food Insecurity (06/19/2024)  Housing: Low Risk (06/19/2024)  Transportation Needs: No Transportation Needs (06/19/2024)  Utilities: Not At Risk (06/19/2024)  Social Connections: Patient Unable To Answer (06/19/2024)  Tobacco Use: Medium Risk (06/19/2024)    Readmission Risk Interventions    06/20/2024    2:21 PM  Readmission Risk Prevention Plan  Transportation Screening Complete  PCP or Specialist Appt within 5-7 Days Complete  Home Care Screening Complete  Medication Review (RN CM) Complete

## 2024-06-27 NOTE — Plan of Care (Signed)
" °  Problem: Fluid Volume: Goal: Ability to maintain a balanced intake and output will improve Outcome: Progressing   Problem: Nutritional: Goal: Maintenance of adequate nutrition will improve Outcome: Progressing   Problem: Clinical Measurements: Goal: Ability to maintain clinical measurements within normal limits will improve Outcome: Progressing Goal: Diagnostic test results will improve Outcome: Progressing Goal: Respiratory complications will improve Outcome: Progressing   Problem: Activity: Goal: Risk for activity intolerance will decrease Outcome: Progressing   "

## 2024-06-27 NOTE — Progress Notes (Signed)
 Physical Therapy Treatment Patient Details Name: [name] MRN: [id number] DOB: 08/29/1927 Today's Date: 06/27/2024   History of Present Illness The patient is a 89 year old gentleman who presented to the emergency room 06/18/24 with  diarrhea and weakness, and  difficulty walking after an apparant mechanical fall.SABRA  He was then admitted due to acute renal failure.  In the emergency room he was complaining of left ankle pain and was having difficulty with mobility and weightbearing on his left ankle.  A CT scan of the left ankle was obtained and it does show a trimalleolar ankle fracture. Treated with short leg posterior splint and NWB on the RLE    PT Comments  Pt is progressing with mobility. Decreased assistance needed for bed mobility and for transfers. Pt was able to take a few pivotal hops on RLE with RW from bed to chair. He was too fatigued to attempt further ambulation. Instructed pt in seated LLE strengthening exercises.     If plan is discharge home, recommend the following: A lot of help with walking and/or transfers;A lot of help with bathing/dressing/bathroom;Assistance with cooking/housework;Assist for transportation;Help with stairs or ramp for entrance   Can travel by private vehicle     No  Equipment Recommendations  Wheelchair (measurements PT);Wheelchair cushion (measurements PT) (elevating legrests)    Recommendations for Other Services       Precautions / Restrictions Precautions Precautions: Fall Recall of Precautions/Restrictions: Impaired Precaution/Restrictions Comments: pt did not recall that he has an ankle fracture (when reminded to not put weight on LLE pt asked why); Utilized video interpreter#[id number] Required Braces or Orthoses: Splint/Cast Splint/Cast: soft cast/ACE Restrictions Weight Bearing Restrictions Per Provider Order: Yes LLE Weight Bearing Per Provider Order: Non weight bearing     Mobility  Bed Mobility Overal bed mobility: Needs  Assistance Bed Mobility: Supine to Sit     Supine to sit: Supervision, HOB elevated, Used rails          Transfers Overall transfer level: Needs assistance Equipment used: Rolling walker (2 wheels) Transfers: Sit to/from Stand, Bed to chair/wheelchair/BSC Sit to Stand: Min assist Stand pivot transfers: Min assist         General transfer comment: pt able to take a few hops on RLE with RW bed to recliner,  with verbal cues he had good adherence to NWB status LLE    Ambulation/Gait               General Gait Details: pt reported he was too fatigued to attempt further ambulation   Stairs             Wheelchair Mobility     Tilt Bed    Modified Rankin (Stroke Patients Only)       Balance Overall balance assessment: Needs assistance Sitting-balance support: Feet supported, No upper extremity supported Sitting balance-Leahy Scale: Fair     Standing balance support: Reliant on assistive device for balance, During functional activity, Bilateral upper extremity supported Standing balance-Leahy Scale: Poor                              Communication Communication Communication: No apparent difficulties Factors Affecting Communication: Non - English speaking, interpreter not available (Pt speaks Russian. His daughter was present and interpreted throughout session)  Cognition Arousal: Alert Behavior During Therapy: WFL for tasks assessed/performed   PT - Cognitive impairments: Memory Difficult to assess due to: Non-English speaking  PT - Cognition Comments: ussed Ipad interpreter, pt did not recall that he has a L ankle fracture Following commands: Intact Following commands impaired: Follows one step commands with increased time    Cueing Cueing Techniques: Verbal cues, Gestural cues  Exercises General Exercises - Lower Extremity Long Arc Quad: AROM, Left, 10 reps, Seated Hip Flexion/Marching: AROM, Left, 5  reps, Seated, Limitations Hip Flexion/Marching Limitations: reps limited by ankle pain    General Comments        Pertinent Vitals/Pain Pain Assessment Faces Pain Scale: Hurts little more Breathing: normal Negative Vocalization: none Facial Expression: sad, frightened, frown Body Language: tense, distressed pacing, fidgeting Consolability: no need to console PAINAD Score: 2 Pain Descriptors / Indicators: Grimacing Pain Intervention(s): Limited activity within patient's tolerance, Monitored during session, Repositioned (pt declined pain medicine)    Home Living                          Prior Function            PT Goals (current goals can now be found in the care plan section) Acute Rehab PT Goals Patient Stated Goal: agreed to mobility PT Goal Formulation: With patient Time For Goal Achievement: 07/03/24 Potential to Achieve Goals: Good Additional Goals Additional Goal #1: setup/ propel Wc x 100' with supervision Progress towards PT goals: Progressing toward goals    Frequency    Min 2X/week      PT Plan      Co-evaluation              AM-PAC PT 6 Clicks Mobility   Outcome Measure  Help needed turning from your back to your side while in a flat bed without using bedrails?: A Little Help needed moving from lying on your back to sitting on the side of a flat bed without using bedrails?: A Little Help needed moving to and from a bed to a chair (including a wheelchair)?: A Little Help needed standing up from a chair using your arms (e.g., wheelchair or bedside chair)?: A Lot Help needed to walk in hospital room?: A Lot Help needed climbing 3-5 steps with a railing? : Total 6 Click Score: 14    End of Session Equipment Utilized During Treatment: Gait belt Activity Tolerance: Patient limited by fatigue;Patient limited by pain Patient left: in chair;with call bell/phone within reach;with chair alarm set Nurse Communication: Mobility status PT  Visit Diagnosis: Unsteadiness on feet (R26.81);Other abnormalities of gait and mobility (R26.89);Muscle weakness (generalized) (M62.81);History of falling (Z91.81);Pain Pain - Right/Left: Left Pain - part of body: Ankle and joints of foot     Time: 8875-8861 PT Time Calculation (min) (ACUTE ONLY): 14 min  Charges:    $Therapeutic Activity: 8-22 mins PT General Charges $$ ACUTE PT VISIT: 1 Visit                     Sylvan Delon Copp PT 06/27/2024  Acute Rehabilitation Services  Office [phone]

## 2024-06-27 NOTE — Progress Notes (Signed)
 " PROGRESS NOTE  [name] FMW:[id number] DOB: 1927-07-28 DOA: 06/18/2024 PCP: Rexanne Ingle, MD   LOS: 8 days   Brief Narrative / Interim history: 89 year old male with history of bioprosthetic aortic valve replacement status post TAVR, prior CVA on Eliquis , HTN, BPH, RA, psoriasis, liver cirrhosis comes into the hospital with weakness and difficulties walking.  Imaging on admission was concerning for sigmoid diverticulitis, and also CT scan showed ankle fracture.  He was placed on antibiotics and admitted to the hospital.  Subjective / 24h Interval events: Russian iPad interpreter used in the room.  Has underlying dementia, overall feeling well.  He is not sure what happened.  Assesement and Plan: Principal problem Acute sigmoid diverticulitis -he has completed a week of IV antibiotics, now discontinued.  He has no abdominal pain, no leukocytosis, he is afebrile.  He is medically stable to discharge to SNF/CIR per family preference.  Awaiting insurance authorization  Active problems Acute kidney injury-came in with a creatinine of 1.5.  Now back to baseline, creatinine 1.0  Possible aspiration pneumonia-completed antibiotics as per #1.  He is afebrile, on room air.  White count is normal  Left ankle fracture-orthopedic surgery consulted, conservative management was recommended.  He has a splint as per orthopedic recommendations.  Needs short-term SNF for rehab  Dementia, delirium-continue Seroquel .  He is awake, alert, confused but appropriate  Essential hypertension-continue hydralazine , ARB on hold.  Continue to monitor blood pressure  Hypokalemia-continue to monitor and replenish as indicated.  Potassium 4.0  History of TAVR-no active issues  Prior CVA-continue Eliquis   DM2, with hyperglycemia-continue sliding scale.  CBGs reviewed and stable  CBG (last 3)  Recent Labs    06/26/24 2159 06/27/24 0801 06/27/24 1110  GLUCAP 121* 138* 150*    History of psoriasis,  RA-outpatient management  History of liver cirrhosis-appears compensated  Disposition-PT recommending SNF, family very interested in CIR.  TOC consulted, he is medically stable to go either to SNF versus CIR  Scheduled Meds:  acetaminophen   1,000 mg Oral Q8H   apixaban   5 mg Oral BID   dutasteride   0.5 mg Oral Daily   escitalopram   20 mg Oral QHS   hydrALAZINE   50 mg Oral Q8H   insulin  aspart  0-5 Units Subcutaneous QHS   insulin  aspart  0-9 Units Subcutaneous TID WC   pantoprazole   40 mg Oral Daily   psyllium  1 packet Oral Daily   QUEtiapine   50 mg Oral Q24H   senna-docusate  1 tablet Oral BID   Continuous Infusions: PRN Meds:.ipratropium-albuterol , melatonin, oxyCODONE   Current Outpatient Medications  Medication Instructions   apixaban  (ELIQUIS ) 5 mg, Oral, 2 times daily   clobetasol  cream (TEMOVATE ) 0.05 % 1 application , Topical, 2 times daily   dutasteride  (AVODART ) 0.5 mg, Daily   escitalopram  (LEXAPRO ) 10 mg, Daily   furosemide  (LASIX ) 20 MG tablet TAKE 1 TABLET(20 MG) BY MOUTH DAILY AS NEEDED   losartan  (COZAAR ) 50 mg, Oral, Daily   losartan  (COZAAR ) 25 mg, Oral, Daily   melatonin 10-15 mg, As needed   Multiple Vitamin (MULTIVITAMIN WITH MINERALS) TABS tablet 1 tablet, Oral, Daily   pantoprazole  (PROTONIX ) 40 mg, Oral, Daily   polyethylene glycol (MIRALAX ) 17 g, Oral, Daily PRN   potassium chloride  (KLOR-CON ) 10 MEQ tablet 10 mEq, Oral, Daily   senna-docusate (SENOKOT-S) 8.6-50 MG tablet 1 tablet, Oral, 2 times daily   SKYRIZI  PEN 150 MG/ML SOAJ INJECT 150MG  SUBCUTANEOUSLY EVERY 12 WEEKS AS DIRECTED.    Diet Orders (From  admission, onward)     Start     Ordered   06/21/24 1112  Diet heart healthy/carb modified Room service appropriate? Yes; Fluid consistency: Thin  Diet effective now       Question Answer Comment  Diet-HS Snack? Nothing   Room service appropriate? Yes   Fluid consistency: Thin      06/21/24 1111            DVT prophylaxis:  apixaban   (ELIQUIS ) tablet 5 mg   Lab Results  Component Value Date   PLT 255 06/27/2024      Code Status: Limited: Do not attempt resuscitation (DNR) -DNR-LIMITED -Do Not Intubate/DNI   Family Communication: No family at bedside  Status is: Inpatient Remains inpatient appropriate because: Awaiting placement   Level of care: Med-Surg  Consultants:  Palliative care  Objective: Vitals:   06/26/24 1150 06/26/24 1924 06/27/24 0500 06/27/24 0553  BP: (!) 149/51 (!) 165/63  (!) 183/64  Pulse: 70 77  68  Resp: 20 19  19   Temp: 98 F (36.7 C) 98.5 F (36.9 C)  97.7 F (36.5 C)  TempSrc: Oral   Oral  SpO2: 97% 94%  95%  Weight:   82.5 kg   Height:        Intake/Output Summary (Last 24 hours) at 06/27/2024 1254 Last data filed at 06/27/2024 1014 Gross per 24 hour  Intake 460 ml  Output 475 ml  Net -15 ml   Wt Readings from Last 3 Encounters:  06/27/24 82.5 kg  02/07/24 79.9 kg  09/11/23 79.5 kg    Examination:  Constitutional: NAD Eyes: lids and conjunctivae normal, no scleral icterus ENMT: mmm Neck: normal, supple Respiratory: clear to auscultation bilaterally, no wheezing, no crackles. Normal respiratory effort.  Cardiovascular: Regular rate and rhythm, no murmurs / rubs / gallops. No LE edema. Abdomen: soft, no distention, no tenderness. Bowel sounds positive.   Data Reviewed: I have independently reviewed following labs and imaging studies   CBC Recent Labs  Lab 06/20/24 1324 06/22/24 0548 06/24/24 0532 06/26/24 0658 06/27/24 0602  WBC 8.1 7.6 7.3 8.2 7.4  HGB 12.0* 11.2* 11.7* 11.0* 11.1*  HCT 36.5* 35.1* 37.2* 33.9* 34.8*  PLT 145* 166 197 221 255  MCV 87.3 88.2 90.7 89.4 90.4  MCH 28.7 28.1 28.5 29.0 28.8  MCHC 32.9 31.9 31.5 32.4 31.9  RDW 12.1 12.1 12.2 12.5 12.7  LYMPHSABS 1.2 1.3 1.2 1.6  --   MONOABS 0.6 0.7 0.7 0.7  --   EOSABS 0.2 0.5 0.6* 0.5  --   BASOSABS 0.0 0.0 0.0 0.0  --     Recent Labs  Lab 06/23/24 0600 06/24/24 0532  06/25/24 0640 06/26/24 0658 06/27/24 0602  NA 142 140 139 140 140  K 3.8 3.5 3.8 3.6 4.0  CL 108 106 105 105 109  CO2 20* 22 24 23  21*  GLUCOSE 140* 122* 127* 112* 105*  BUN 18 14 13 13 13   CREATININE 1.26* 1.11 1.08 1.09 1.04  CALCIUM  8.7* 9.1 9.1 8.8* 8.9  AST  --   --   --   --  53*  ALT  --   --   --   --  35  ALKPHOS  --   --   --   --  70  BILITOT  --   --   --   --  1.3*  ALBUMIN  --   --   --   --  3.5  MG 2.1 2.2  2.1 2.1 2.3    ------------------------------------------------------------------------------------------------------------------ No results for input(s): CHOL, HDL, LDLCALC, TRIG, CHOLHDL, LDLDIRECT in the last 72 hours.  Lab Results  Component Value Date   HGBA1C 7.0 (H) 06/19/2024   ------------------------------------------------------------------------------------------------------------------ No results for input(s): TSH, T4TOTAL, T3FREE, THYROIDAB in the last 72 hours.  Invalid input(s): FREET3  Cardiac Enzymes No results for input(s): CKMB, TROPONINI, MYOGLOBIN in the last 168 hours.  Invalid input(s): CK ------------------------------------------------------------------------------------------------------------------    Component Value Date/Time   BNP 48.9 10/04/2019 2133    CBG: Recent Labs  Lab 06/26/24 1147 06/26/24 1636 06/26/24 2159 06/27/24 0801 06/27/24 1110  GLUCAP 155* 103* 121* 138* 150*    No results found for this or any previous visit (from the past 240 hours).   Radiology Studies: No results found.   Nilda Fendt, MD, PhD [facility]  Between 7 am - 7 pm I am available, please contact me via Amion (for emergencies) or Securechat (non urgent messages)  Between 7 pm - 7 am I am not available, please contact night coverage MD/APP via Amion  "

## 2024-06-28 DIAGNOSIS — N179 Acute kidney failure, unspecified: Secondary | ICD-10-CM | POA: Diagnosis not present

## 2024-06-28 LAB — GLUCOSE, CAPILLARY
Glucose-Capillary: 125 mg/dL — ABNORMAL HIGH (ref 70–99)
Glucose-Capillary: 179 mg/dL — ABNORMAL HIGH (ref 70–99)

## 2024-06-28 MED ORDER — OXYCODONE HCL 5 MG PO TABS: 2.5000 mg | ORAL_TABLET | ORAL | 0 refills | Status: AC | PRN

## 2024-06-28 MED ORDER — QUETIAPINE FUMARATE 50 MG PO TABS: 50.0000 mg | ORAL_TABLET | ORAL | Status: AC

## 2024-06-28 MED ORDER — AMLODIPINE BESYLATE 5 MG PO TABS
5.0000 mg | ORAL_TABLET | Freq: Every day | ORAL | Status: DC
  Administered 2024-06-28: 5 mg via ORAL
  Filled 2024-06-28: qty 1

## 2024-06-28 MED ORDER — HYDRALAZINE HCL 50 MG PO TABS: 50.0000 mg | ORAL_TABLET | Freq: Three times a day (TID) | ORAL | Status: AC

## 2024-06-28 NOTE — Progress Notes (Addendum)
 Discharge instructions communicated to accepting facility/receiving nurse.  All questions answered, verbalized understanding.  Copy of instructions provided, all personal belongings packed and remain with patients daughter for discharge.  Discharge to SNF Report called to [phone] to room #501

## 2024-06-28 NOTE — Discharge Summary (Signed)
 "  Physician Discharge Summary  Jamas Jaquay FMW:[id number] DOB: [birth year] DOA: 06/18/2024  PCP: Rexanne Ingle, MD  Admit date: 06/18/2024 Discharge date: 06/28/2024  Admitted From: home Disposition:  SNF  Recommendations for Outpatient Follow-up:  Follow up with PCP in 1-2 weeks Please obtain BMP/CBC in one week  Home Health: none Equipment/Devices: none  Discharge Condition: stable CODE STATUS: DNR Diet Orders (From admission, onward)     Start     Ordered   06/21/24 1112  Diet heart healthy/carb modified Room service appropriate? Yes; Fluid consistency: Thin  Diet effective now       Question Answer Comment  Diet-HS Snack? Nothing   Room service appropriate? Yes   Fluid consistency: Thin      06/21/24 1111            Brief Narrative / Interim history: 89 year old male with history of bioprosthetic aortic valve replacement status post TAVR, prior CVA on Eliquis , HTN, BPH, RA, psoriasis, liver cirrhosis comes into the hospital with weakness and difficulties walking.  Imaging on admission was concerning for sigmoid diverticulitis, and also CT scan showed ankle fracture.  He was placed on antibiotics and admitted to the hospital.  Hospital Course / Discharge diagnoses: Principal problem Acute sigmoid diverticulitis -he has completed a week of IV antibiotics, now discontinued.  He has no abdominal pain, no leukocytosis, he is afebrile.  He is tolerating a regular diet, no nausea or vomiting.  He is medically stable to discharge to SNF.   Active problems Acute kidney injury-came in with a creatinine of 1.5.  Now back to baseline, creatinine 1.0 Possible aspiration pneumonia-completed antibiotics as per #1.  He is afebrile, on room air.  White count is normal Left ankle fracture-orthopedic surgery consulted, conservative management was recommended.  He has a splint as per orthopedic recommendations.  Needs short-term SNF for rehab.  Continue pain control Dementia,  delirium-continue Seroquel .  He is awake, alert, confused but appropriate Essential hypertension-continue hydralazine , resume home ARB  Hypokalemia-continue to monitor and replenish as indicated.  Potassium 4.0 History of TAVR-no active issues Prior CVA-continue Eliquis  DM2, with hyperglycemia-CBGs stable  Sepsis ruled out   Discharge Instructions   Allergies as of 06/28/2024       Reactions   Ativan  [lorazepam ] Other (See Comments)   hallucinations   Phenergan  [promethazine  Hcl] Hypertension   hallucinations   Haldol  [haloperidol ]    hallucinations   Methocarbamol  Other (See Comments)   Raises blood pressure    Solifenacin Other (See Comments)   Not effective         Medication List     STOP taking these medications    furosemide  20 MG tablet Commonly known as: LASIX    potassium chloride  10 MEQ tablet Commonly known as: KLOR-CON        TAKE these medications    apixaban  5 MG Tabs tablet Commonly known as: Eliquis  Take 1 tablet (5 mg total) by mouth 2 (two) times daily.   clobetasol  cream 0.05 % Commonly known as: TEMOVATE  Apply 1 application. topically 2 (two) times daily.   dutasteride  0.5 MG capsule Commonly known as: AVODART  Take 0.5 mg by mouth daily.   escitalopram  10 MG tablet Commonly known as: LEXAPRO  Take 10 mg by mouth daily.   hydrALAZINE  50 MG tablet Commonly known as: APRESOLINE  Take 1 tablet (50 mg total) by mouth every 8 (eight) hours.   losartan  50 MG tablet Commonly known as: COZAAR  Take 1 tablet (50 mg total) by mouth daily. What changed:  Another medication with the same name was removed. Continue taking this medication, and follow the directions you see here.   melatonin 5 MG Tabs Take 10-15 mg by mouth as needed (for sleep).   multivitamin with minerals Tabs tablet Take 1 tablet by mouth daily.   oxyCODONE  5 MG immediate release tablet Commonly known as: Oxy IR/ROXICODONE  Take 0.5 tablets (2.5 mg total) by mouth every 4  (four) hours as needed for severe pain (pain score 7-10) or breakthrough pain.   pantoprazole  40 MG tablet Commonly known as: PROTONIX  Take 1 tablet (40 mg total) by mouth daily.   polyethylene glycol 17 g packet Commonly known as: MiraLax  Take 17 g by mouth daily as needed for moderate constipation.   QUEtiapine  50 MG tablet Commonly known as: SEROQUEL  Take 1 tablet (50 mg total) by mouth daily.   senna-docusate 8.6-50 MG tablet Commonly known as: Senokot-S Take 1 tablet by mouth 2 (two) times daily.   Skyrizi  Pen 150 MG/ML pen Generic drug: risankizumab -rzaa INJECT 150MG  SUBCUTANEOUSLY EVERY 12 WEEKS AS DIRECTED. What changed: See the new instructions.        Contact information for follow-up providers     Vernetta Lonni GRADE, MD. Schedule an appointment as soon as possible for a visit in 1 week(s).   Specialty: Orthopedic Surgery Contact information: [address] [place] [address] [phone]              Contact information for after-discharge care     Destination     [facility] .   Service: Skilled Nursing Contact information: [address] [place]  [address] [phone]                     Consultations: none  Procedures/Studies:  DG CHEST PORT 1 VIEW Result Date: 06/20/2024 EXAM: 1 VIEW XRAY OF THE CHEST 06/20/2024 12:57:00 PM COMPARISON: None available. CLINICAL HISTORY: Pneumonia FINDINGS: LINES, TUBES AND DEVICES: Multiple EKG leads noted. LUNGS AND PLEURA: Low lung volumes. Diffuse interstitial prominence. No focal consolidation. No pleural effusion. No pneumothorax. HEART AND MEDIASTINUM: Cardiomegaly. Status post TAVR. Aortic atherosclerotic calcifications. BONES AND SOFT TISSUES: Old healed right rib fractures. IMPRESSION: 1. No focal consolidation to suggest pneumonia. 2. Low lung volumes with diffuse interstitial prominence. Electronically signed by: Norleen Boxer MD MD  06/20/2024 01:28 PM EST RP Workstation: [id number]   CT ANKLE LEFT WO CONTRAST Result Date: 06/19/2024 EXAM: CT LEFT ANKLE, WITHOUT IV CONTRAST 06/19/2024 03:01:05 AM TECHNIQUE: Axial images were acquired through the left ankle without IV contrast. Reformatted images were reviewed. Automated exposure control, iterative reconstruction, and/or weight based adjustment of the mA/kV was utilized to reduce the radiation dose to as low as reasonably achievable. COMPARISON: None. CLINICAL HISTORY: pain FINDINGS: BONES: There is an acute closed trimalleolar left ankle fracture. There is mild osteopenia. In the fibula, there is a comminuted distal shaft fracture along the syndesmosis above the level of the tibial plafond with a dorsal butterfly fragment and a few tiny comminuted fragments laterally at the fracture margin. The main distal fragment is displaced anteriorly and laterally by 1 cortex width. There is a nondisplaced transverse oblique fracture of the distal lateral malleolus, and a nondisplaced transverse fracture of the medial malleolus. An additional more or less curvilinear fracture extends from the lateral aspect of the tibial plafond into the posterior malleolar area, and there is an impaction injury to the posteromedial distal tibial metaphysis with a few small chip fracture fragments above  the level of the malleoli. There is a tiny bipartite accessory navicular bone. There are no further fractures. There are mild background degenerative changes with trace spurring of the medial malleolus and the talonavicular joint, and small noninflammatory dorsal and plantar calcaneal spurs. JOINTS: No dislocation. There is mild narrowing of the medial mortise and mild widening of the lateral mortise consistent with ligamentous trauma. Trace spurring of the talonavicular joint. SOFT TISSUES: There is moderate diffuse soft tissue edema. No superficial or deep space hematoma is seen. The area tendons grossly intact. The  area ligaments are not well seen with CT. There are calcifications in the anterior tibial and peroneal arteries, to a lesser extent in the posterior tibial artery. IMPRESSION: 1. Acute closed trimalleolar left ankle fracture with comminuted distal fibular shaft fracture (displaced anteriorly and laterally by 1 cortex width), nondisplaced distal lateral malleolus fracture, nondisplaced medial malleolus fracture, and a curvilinear fracture extending from the lateral tibial plafond to the posterior malleolus . 2. Mild narrowing of the medial mortise and mild widening of the lateral mortise, consistent with ligamentous trauma. 3. Moderate diffuse soft tissue edema without superficial or deep space hematoma. 4. Calcific arteriosclerosis. Electronically signed by: Francis Quam MD 06/19/2024 03:30 AM EST RP Workstation: [id number]   CT HEAD WO CONTRAST ( ) Result Date: 06/19/2024 EXAM: CT HEAD WITHOUT CONTRAST 06/19/2024 01:51:26 AM TECHNIQUE: CT of the head was performed without the administration of intravenous contrast. Automated exposure control, iterative reconstruction, and/or weight based adjustment of the mA/kV was utilized to reduce the radiation dose to as low as reasonably achievable. COMPARISON: 12/20/2021 CLINICAL HISTORY: fall FINDINGS: BRAIN AND VENTRICLES: No acute hemorrhage. No evidence of acute infarct. No hydrocephalus. No extra-axial collection. No mass effect or midline shift. Atrophy and chronic small vessel disease throughout the deep white matter. ORBITS: No acute abnormality. SINUSES: No acute abnormality. SOFT TISSUES AND SKULL: No acute soft tissue abnormality. No skull fracture. IMPRESSION: 1. No acute intracranial abnormality. Electronically signed by: Franky Crease MD 06/19/2024 02:00 AM EST RP Workstation: [id number]   CT ABDOMEN PELVIS W CONTRAST Result Date: 06/18/2024 EXAM: CT ABDOMEN AND PELVIS WITH CONTRAST 06/18/2024 07:07:45 PM TECHNIQUE: CT of the abdomen and pelvis was  performed with the administration of 80 mL of iohexol  (OMNIPAQUE ) 300 MG/ML solution. Multiplanar reformatted images are provided for review. Automated exposure control, iterative reconstruction, and/or weight-based adjustment of the mA/kV was utilized to reduce the radiation dose to as low as reasonably achievable. COMPARISON: 12/24/2021 CLINICAL HISTORY: Abdominal pain, acute, nonlocalized. Weakness, decreased appetite, and diarrhea over the past 16 hours. FINDINGS: LOWER CHEST: Cardiac valve prosthesis. Motion artifact in the lung bases. No focal consolidation. Right lower lung nodule centrally measuring 6 mm in diameter. No change since prior study. LIVER: Diffuse fatty infiltration of the liver. Scattered calcifications in the liver, likely granulomas. GALLBLADDER AND BILE DUCTS: Gallbladder surgically absent. Pneumobilia is likely postoperative. No biliary ductal dilatation. SPLEEN: Spleen is normal. PANCREAS: Pancreas is normal. ADRENAL GLANDS: Adrenal glands are normal. KIDNEYS, URETERS AND BLADDER: Multiple bilateral renal cysts, largest on the right measuring 3.5 cm in diameter. No change since prior study. No imaging follow-up is indicated. No stones in the kidneys or ureters. No hydronephrosis or hydroureter. No perinephric or periureteral stranding. The bladder is normal. GI AND BOWEL: Stomach, small bowel, and colon are not abnormally distended. No wall thickening or inflammatory stranding. Diverticulosis of the sigmoid colon. Minimal pericolonic infiltration may represent early changes of acute diverticulitis. The appendix is normal. There is no bowel  obstruction. PERITONEUM AND RETROPERITONEUM: No ascites. No free air. VASCULATURE: Aorta is normal in caliber. Calcification of the aorta. No aneurysm. LYMPH NODES: No lymphadenopathy. REPRODUCTIVE ORGANS: The prostate gland is not enlarged. BONES AND SOFT TISSUES: Degenerative changes in the spine. No acute osseous abnormality. No focal soft tissue  abnormality. IMPRESSION: 1. Diverticulosis of the sigmoid colon with minimal pericolonic infiltration, possibly representing early changes of acute diverticulitis. No abscess. Electronically signed by: Elsie Gravely MD 06/18/2024 07:29 PM EST RP Workstation: [id number]   DG Knee Complete 4 Views Right Result Date: 06/18/2024 EXAM: 4 VIEW(S) XRAY OF THE KNEE 06/18/2024 04:46:00 PM COMPARISON: None available. CLINICAL HISTORY: pain after fall FINDINGS: BONES AND JOINTS: No acute fracture. No malalignment. No significant joint effusion. Minimal tricompartmental osteophyte formation consistent with osteoarthritis. SOFT TISSUES: Vascular calcifications are present. The remaining visualized soft tissues are unremarkable. IMPRESSION: 1. No acute fracture or dislocation. 2. Minimal tricompartmental osteoarthritis. Electronically signed by: Greig Pique MD 06/18/2024 05:17 PM EST RP Workstation: [id number]     Subjective: - no chest pain, shortness of breath, no abdominal pain, nausea or vomiting.   Discharge Exam: BP (!) 186/66   Pulse 77   Temp 97.6 F (36.4 C) (Oral)   Resp 19   Ht 5' 3 (1.6 m)   Wt 82.5 kg   SpO2 99%   BMI 32.22 kg/m   General: Pt is alert, awake, not in acute distress Cardiovascular: RRR, S1/S2 +, no rubs, no gallops Respiratory: CTA bilaterally, no wheezing, no rhonchi Abdominal: Soft, NT, ND, bowel sounds + Extremities: no edema, no cyanosis    The results of significant diagnostics from this hospitalization (including imaging, microbiology, ancillary and laboratory) are listed below for reference.     Microbiology: No results found for this or any previous visit (from the past 240 hours).   Labs: Basic Metabolic Panel: Recent Labs  Lab 06/23/24 0600 06/24/24 0532 06/25/24 0640 06/26/24 0658 06/27/24 0602  NA 142 140 139 140 140  K 3.8 3.5 3.8 3.6 4.0  CL 108 106 105 105 109  CO2 20* 22 24 23  21*  GLUCOSE 140* 122* 127* 112* 105*  BUN 18 14 13 13 13    CREATININE 1.26* 1.11 1.08 1.09 1.04  CALCIUM  8.7* 9.1 9.1 8.8* 8.9  MG 2.1 2.2 2.1 2.1 2.3   Liver Function Tests: Recent Labs  Lab 06/27/24 0602  AST 53*  ALT 35  ALKPHOS 70  BILITOT 1.3*  PROT 6.8  ALBUMIN 3.5   CBC: Recent Labs  Lab 06/22/24 0548 06/24/24 0532 06/26/24 0658 06/27/24 0602  WBC 7.6 7.3 8.2 7.4  NEUTROABS 5.1 4.7 5.3  --   HGB 11.2* 11.7* 11.0* 11.1*  HCT 35.1* 37.2* 33.9* 34.8*  MCV 88.2 90.7 89.4 90.4  PLT 166 197 221 255   CBG: Recent Labs  Lab 06/27/24 0801 06/27/24 1110 06/27/24 1646 06/27/24 2209 06/28/24 0810  GLUCAP 138* 150* 106* 118* 125*   Hgb A1c No results for input(s): HGBA1C in the last 72 hours. Lipid Profile No results for input(s): CHOL, HDL, LDLCALC, TRIG, CHOLHDL, LDLDIRECT in the last 72 hours. Thyroid  function studies No results for input(s): TSH, T4TOTAL, T3FREE, THYROIDAB in the last 72 hours.  Invalid input(s): FREET3 Urinalysis    Component Value Date/Time   COLORURINE YELLOW 06/18/2024 2031   APPEARANCEUR HAZY (A) 06/18/2024 2031   LABSPEC >1.046 (H) 06/18/2024 2031   PHURINE 5.0 06/18/2024 2031   GLUCOSEU NEGATIVE 06/18/2024 2031   HGBUR SMALL (A) 06/18/2024 2031  BILIRUBINUR NEGATIVE 06/18/2024 2031   KETONESUR NEGATIVE 06/18/2024 2031   PROTEINUR 30 (A) 06/18/2024 2031   UROBILINOGEN 1.0 04/14/2014 0116   NITRITE NEGATIVE 06/18/2024 2031   LEUKOCYTESUR NEGATIVE 06/18/2024 2031    FURTHER DISCHARGE INSTRUCTIONS:   Get Medicines reviewed and adjusted: Please take all your medications with you for your next visit with your Primary MD   Laboratory/radiological data: Please request your Primary MD to go over all hospital tests and procedure/radiological results at the follow up, please ask your Primary MD to get all Hospital records sent to his/her office.   In some cases, they will be blood work, cultures and biopsy results pending at the time of your discharge. Please  request that your primary care M.D. goes through all the records of your hospital data and follows up on these results.   Also Note the following: If you experience worsening of your admission symptoms, develop shortness of breath, life threatening emergency, suicidal or homicidal thoughts you must seek medical attention immediately by calling 911 or calling your MD immediately  if symptoms less severe.   You must read complete instructions/literature along with all the possible adverse reactions/side effects for all the Medicines you take and that have been prescribed to you. Take any new Medicines after you have completely understood and accpet all the possible adverse reactions/side effects.    Do not drive when taking Pain medications or sleeping medications (Benzodaizepines)   Do not take more than prescribed Pain, Sleep and Anxiety Medications. It is not advisable to combine anxiety,sleep and pain medications without talking with your primary care practitioner   Special Instructions: If you have smoked or chewed Tobacco  in the last 2 yrs please stop smoking, stop any regular Alcohol  and or any Recreational drug use.   Wear Seat belts while driving.   Please note: You were cared for by a hospitalist during your hospital stay. Once you are discharged, your primary care physician will handle any further medical issues. Please note that NO REFILLS for any discharge medications will be authorized once you are discharged, as it is imperative that you return to your primary care physician (or establish a relationship with a primary care physician if you do not have one) for your post hospital discharge needs so that they can reassess your need for medications and monitor your lab values.  Time coordinating discharge: 35 minutes  SIGNED:  Nilda Fendt, MD, PhD 06/28/2024, 9:51 AM   "

## 2024-06-28 NOTE — TOC Progression Note (Addendum)
 Transition of Care [facility]) - Progression Note    Patient Details  Name: [name] MRN: [id number] Date of Birth: 06-Jun-1928  [facility]) CM/SW Contact  Sonda Manuella Quill, RN Phone Number: 06/28/2024, 9:29 AM  Clinical Narrative:    Beatris reinhold Gift at [place]; she said case is under review by coordinator and clinicals have been received; Gift said agency will call when decision reached.  -[phone]- ins auth received; Plan Auth ID # [id number], Auth ID # [id number]; start 06/28/24, end 07/02/24; Dr Trixie notified via secure chat; he said pt is ready for d/c; notified Darrien at [facility]; she said pt can admit today; awaiting d/c summary.  -[phone]- received call from [place] on behalf of [employer]; she said ins auth received; she was notified ins auth info already in [place]; member # and DOB verified; see d/c note. Expected Discharge Plan: Skilled Nursing Facility Barriers to Discharge: Continued Medical Work up               Expected Discharge Plan and Services In-house Referral: Clinical Social Work Discharge Planning Services: NA Post Acute Care Choice: Resumption of Svcs/PTA Provider, IP Rehab Living arrangements for the past 2 months: Single Family Home                 DME Arranged: N/A DME Agency: NA       HH Arranged: NA HH Agency: NA         Social Drivers of Health (SDOH) Interventions SDOH Screenings   Food Insecurity: No Food Insecurity (06/19/2024)  Housing: Low Risk (06/19/2024)  Transportation Needs: No Transportation Needs (06/19/2024)  Utilities: Not At Risk (06/19/2024)  Social Connections: Patient Unable To Answer (06/19/2024)  Tobacco Use: Medium Risk (06/19/2024)    Readmission Risk Interventions    06/20/2024    2:21 PM  Readmission Risk Prevention Plan  Transportation Screening Complete  PCP or Specialist Appt within 5-7 Days Complete  Home Care Screening Complete  Medication Review (RN CM) Complete

## 2024-06-28 NOTE — TOC Transition Note (Signed)
 Transition of Care [facility]) - Discharge Note   Patient Details  Name: [name] MRN: [id number] Date of Birth: [birth year]  Transition [facility]) CM/SW Contact:  Sonda Manuella Quill, RN Phone Number: 06/28/2024, 10:36 AM   Clinical Narrative:    D/C orders received; spoke w/ Darrien at [facility]; she gave RM # [phone], call report # [id number]; transprt by [facility]; pt's dtr [name] Herder [phone]) notified; she agreed to d/c plan and requested [facility] not be called until she arrives at hospital; D/C summary and SNF transfer report sent via hub.  -1145- pt's dtr at bedside; she has notified pt; [facility] called for transport; spoke w/ Dick; Darrien at facility notified; no IP CM needs.  Final next level of care: Skilled Nursing Facility Barriers to Discharge: No Barriers Identified   Patient Goals and CMS Choice Patient states their goals for this hospitalization and ongoing recovery are:: To return home or go to [facility] CMS Medicare.gov Compare Post Acute Care list provided to:: Patient Represenative (must comment) Choice offered to / list presented to : Adult Children  ownership interest in [facility].provided to:: Adult Children    Discharge Placement              Patient chooses bed at: [facility] Patient to be transferred to facility by: [facility] Name of family member notified: Branston Halsted (dtr) [phone] Patient and family notified of of transfer: 06/28/24  Discharge Plan and Services Additional resources added to the After Visit Summary for   In-house Referral: Clinical Social Work Discharge Planning Services: NA Post Acute Care Choice: Resumption of Svcs/PTA Provider, IP Rehab          DME Arranged: N/A DME Agency: NA       HH Arranged: NA HH Agency: NA        Social Drivers of Health (SDOH) Interventions SDOH Screenings   Food Insecurity: No Food Insecurity (06/19/2024)  Housing: Low Risk (06/19/2024)  Transportation Needs: No  Transportation Needs (06/19/2024)  Utilities: Not At Risk (06/19/2024)  Social Connections: Patient Unable To Answer (06/19/2024)  Tobacco Use: Medium Risk (06/19/2024)     Readmission Risk Interventions    06/20/2024    2:21 PM  Readmission Risk Prevention Plan  Transportation Screening Complete  PCP or Specialist Appt within 5-7 Days Complete  Home Care Screening Complete  Medication Review (RN CM) Complete

## 2024-07-10 ENCOUNTER — Ambulatory Visit: Admitting: Physician Assistant

## 2024-07-10 ENCOUNTER — Encounter: Payer: Self-pay | Admitting: Physician Assistant

## 2024-07-10 ENCOUNTER — Other Ambulatory Visit (INDEPENDENT_AMBULATORY_CARE_PROVIDER_SITE_OTHER): Payer: Self-pay

## 2024-07-10 DIAGNOSIS — M25572 Pain in left ankle and joints of left foot: Secondary | ICD-10-CM

## 2024-07-10 DIAGNOSIS — S82852D Displaced trimalleolar fracture of left lower leg, subsequent encounter for closed fracture with routine healing: Secondary | ICD-10-CM

## 2024-07-10 NOTE — Progress Notes (Signed)
 HPI: Patient and his daughter returns today for follow-up of left ankle trimalleolar fracture.  Again he had had a mechanical fall around 06/19/2024.  CT scan of his ankle did show trimalleolar ankle fracture.  He was placed in a well-padded splint and was to be nonweightbearing.  Presently is staying at a skilled facility is getting some therapy there.  He is on chronic Eliquis .  Physical exam: General well developed well-nourished male in no acute distress. Left lower extremity: Calf supple nontender.  Left foot neurovascular intact.  Significant dry skin but no pending ulcers rashes or lesions throughout the left lower extremity.  Tenderness over the left ankle with palpation.  No gross deformity of the left ankle.  Radiographs: Left ankle 3 views shows bimalleolar ankle fracture.  Talus is well located within the ankle mortise.  No significant callus formation.  Overall alignment remains acceptable.  Impression: Left ankle trimalleolar fracture  Plan: Will place him in a short leg cast left lower extremity.  He will remain nonweightbearing.  Encouraged continued physical therapy for gait balance, and transfers.  He remains a high risk fall given his nonweightbearing status.  Could benefit from continued skilled nursing facility placement.  He will follow-up with us  in 3 weeks we will obtain 3 views of the left ankle out of the cast.  Most likely at that point time we will place him in a cam walker boot partial weightbearing to full weightbearing depending on x-rays and physical examination.

## 2024-07-31 ENCOUNTER — Encounter: Admitting: Physician Assistant
